# Patient Record
Sex: Male | Born: 1955 | Race: Black or African American | Hispanic: No | Marital: Married | Smoking: Current every day smoker
Health system: Southern US, Community
[De-identification: ages and names within clinical notes are randomized; demographics above are authoritative.]

## PROBLEM LIST (undated history)

## (undated) DIAGNOSIS — M545 Low back pain: Secondary | ICD-10-CM

## (undated) DIAGNOSIS — M4180 Other forms of scoliosis, site unspecified: Secondary | ICD-10-CM

## (undated) DIAGNOSIS — C61 Malignant neoplasm of prostate: Secondary | ICD-10-CM

## (undated) DIAGNOSIS — K611 Rectal abscess: Secondary | ICD-10-CM

## (undated) DIAGNOSIS — N189 Chronic kidney disease, unspecified: Secondary | ICD-10-CM

## (undated) DIAGNOSIS — J309 Allergic rhinitis, unspecified: Secondary | ICD-10-CM

## (undated) DIAGNOSIS — R011 Cardiac murmur, unspecified: Secondary | ICD-10-CM

## (undated) DIAGNOSIS — K922 Gastrointestinal hemorrhage, unspecified: Secondary | ICD-10-CM

## (undated) DIAGNOSIS — K449 Diaphragmatic hernia without obstruction or gangrene: Secondary | ICD-10-CM

## (undated) DIAGNOSIS — K859 Acute pancreatitis without necrosis or infection, unspecified: Secondary | ICD-10-CM

## (undated) DIAGNOSIS — Z862 Personal history of diseases of the blood and blood-forming organs and certain disorders involving the immune mechanism: Secondary | ICD-10-CM

## (undated) DIAGNOSIS — I10 Essential (primary) hypertension: Secondary | ICD-10-CM

## (undated) DIAGNOSIS — F2089 Other schizophrenia: Secondary | ICD-10-CM

## (undated) DIAGNOSIS — M1990 Unspecified osteoarthritis, unspecified site: Secondary | ICD-10-CM

## (undated) DIAGNOSIS — K219 Gastro-esophageal reflux disease without esophagitis: Secondary | ICD-10-CM

## (undated) DIAGNOSIS — E785 Hyperlipidemia, unspecified: Secondary | ICD-10-CM

## (undated) DIAGNOSIS — F329 Major depressive disorder, single episode, unspecified: Secondary | ICD-10-CM

## (undated) DIAGNOSIS — N401 Enlarged prostate with lower urinary tract symptoms: Secondary | ICD-10-CM

## (undated) DIAGNOSIS — E0590 Thyrotoxicosis, unspecified without thyrotoxic crisis or storm: Secondary | ICD-10-CM

## (undated) DIAGNOSIS — J449 Chronic obstructive pulmonary disease, unspecified: Secondary | ICD-10-CM

## (undated) DIAGNOSIS — K648 Other hemorrhoids: Secondary | ICD-10-CM

## (undated) DIAGNOSIS — L723 Sebaceous cyst: Secondary | ICD-10-CM

## (undated) DIAGNOSIS — Z8639 Personal history of other endocrine, nutritional and metabolic disease: Secondary | ICD-10-CM

## (undated) DIAGNOSIS — N529 Male erectile dysfunction, unspecified: Secondary | ICD-10-CM

## (undated) DIAGNOSIS — K760 Fatty (change of) liver, not elsewhere classified: Secondary | ICD-10-CM

## (undated) DIAGNOSIS — K209 Esophagitis, unspecified: Secondary | ICD-10-CM

## (undated) DIAGNOSIS — F17200 Nicotine dependence, unspecified, uncomplicated: Secondary | ICD-10-CM

## (undated) DIAGNOSIS — F411 Generalized anxiety disorder: Secondary | ICD-10-CM

## (undated) HISTORY — DX: Acute pancreatitis without necrosis or infection, unspecified: K85.90

## (undated) HISTORY — DX: Chronic obstructive pulmonary disease, unspecified: J44.9

## (undated) HISTORY — DX: Essential (primary) hypertension: I10

## (undated) HISTORY — DX: Sebaceous cyst: L72.3

## (undated) HISTORY — DX: Male erectile dysfunction, unspecified: N52.9

## (undated) HISTORY — DX: Nicotine dependence, unspecified, uncomplicated: F17.200

## (undated) HISTORY — DX: Hyperlipidemia, unspecified: E78.5

## (undated) HISTORY — DX: Personal history of other endocrine, nutritional and metabolic disease: Z86.39

## (undated) HISTORY — DX: Malignant neoplasm of prostate: C61

## (undated) HISTORY — DX: Other forms of scoliosis, site unspecified: M41.80

## (undated) HISTORY — DX: Allergic rhinitis, unspecified: J30.9

## (undated) HISTORY — PX: GANGLION CYST EXCISION: SHX1691

## (undated) HISTORY — DX: Rectal abscess: K61.1

## (undated) HISTORY — DX: Other schizophrenia: F20.89

## (undated) HISTORY — DX: Thyrotoxicosis, unspecified without thyrotoxic crisis or storm: E05.90

## (undated) HISTORY — DX: Other hemorrhoids: K64.8

## (undated) HISTORY — PX: UPPER GASTROINTESTINAL ENDOSCOPY: SHX188

## (undated) HISTORY — DX: Benign prostatic hyperplasia with lower urinary tract symptoms: N40.1

## (undated) HISTORY — DX: Diaphragmatic hernia without obstruction or gangrene: K44.9

## (undated) HISTORY — DX: Esophagitis, unspecified: K20.9

## (undated) HISTORY — DX: Low back pain: M54.5

## (undated) HISTORY — PX: HERNIA REPAIR: SHX51

## (undated) HISTORY — DX: Gastrointestinal hemorrhage, unspecified: K92.2

## (undated) HISTORY — DX: Gastro-esophageal reflux disease without esophagitis: K21.9

## (undated) HISTORY — DX: Chronic kidney disease, unspecified: N18.9

## (undated) HISTORY — PX: SHOULDER SURGERY: SHX246

## (undated) HISTORY — DX: Generalized anxiety disorder: F41.1

## (undated) HISTORY — DX: Personal history of diseases of the blood and blood-forming organs and certain disorders involving the immune mechanism: Z86.2

## (undated) HISTORY — PX: OTHER SURGICAL HISTORY: SHX169

## (undated) HISTORY — DX: Fatty (change of) liver, not elsewhere classified: K76.0

## (undated) HISTORY — DX: Major depressive disorder, single episode, unspecified: F32.9

---

## 1986-04-19 HISTORY — PX: ABDOMINAL EXPLORATION SURGERY: SHX538

## 1998-11-01 ENCOUNTER — Emergency Department (HOSPITAL_COMMUNITY): Admission: EM | Admit: 1998-11-01 | Discharge: 1998-11-01 | Payer: Self-pay

## 1998-11-03 ENCOUNTER — Encounter: Payer: Self-pay | Admitting: Internal Medicine

## 1998-11-03 ENCOUNTER — Inpatient Hospital Stay (HOSPITAL_COMMUNITY): Admission: AD | Admit: 1998-11-03 | Discharge: 1998-11-07 | Payer: Self-pay | Admitting: Internal Medicine

## 1998-11-06 ENCOUNTER — Encounter: Payer: Self-pay | Admitting: Internal Medicine

## 1998-11-18 ENCOUNTER — Encounter: Payer: Self-pay | Admitting: Internal Medicine

## 1998-11-18 ENCOUNTER — Ambulatory Visit (HOSPITAL_COMMUNITY): Admission: RE | Admit: 1998-11-18 | Discharge: 1998-11-18 | Payer: Self-pay | Admitting: Internal Medicine

## 1998-11-27 ENCOUNTER — Emergency Department (HOSPITAL_COMMUNITY): Admission: EM | Admit: 1998-11-27 | Discharge: 1998-11-27 | Payer: Self-pay | Admitting: Emergency Medicine

## 1998-12-07 ENCOUNTER — Emergency Department (HOSPITAL_COMMUNITY): Admission: EM | Admit: 1998-12-07 | Discharge: 1998-12-07 | Payer: Self-pay | Admitting: Emergency Medicine

## 1998-12-07 ENCOUNTER — Encounter: Payer: Self-pay | Admitting: Emergency Medicine

## 1999-08-20 ENCOUNTER — Emergency Department (HOSPITAL_COMMUNITY): Admission: EM | Admit: 1999-08-20 | Discharge: 1999-08-20 | Payer: Self-pay | Admitting: Emergency Medicine

## 1999-08-20 ENCOUNTER — Encounter: Payer: Self-pay | Admitting: Internal Medicine

## 1999-10-05 ENCOUNTER — Encounter: Payer: Self-pay | Admitting: Gastroenterology

## 1999-10-05 ENCOUNTER — Ambulatory Visit (HOSPITAL_COMMUNITY): Admission: RE | Admit: 1999-10-05 | Discharge: 1999-10-05 | Payer: Self-pay | Admitting: Gastroenterology

## 1999-10-05 ENCOUNTER — Encounter (INDEPENDENT_AMBULATORY_CARE_PROVIDER_SITE_OTHER): Payer: Self-pay | Admitting: *Deleted

## 2000-01-16 ENCOUNTER — Emergency Department (HOSPITAL_COMMUNITY): Admission: EM | Admit: 2000-01-16 | Discharge: 2000-01-16 | Payer: Self-pay | Admitting: Emergency Medicine

## 2000-04-20 ENCOUNTER — Encounter: Payer: Self-pay | Admitting: Orthopedic Surgery

## 2000-04-20 ENCOUNTER — Ambulatory Visit (HOSPITAL_COMMUNITY): Admission: RE | Admit: 2000-04-20 | Discharge: 2000-04-20 | Payer: Self-pay | Admitting: Orthopedic Surgery

## 2000-06-17 ENCOUNTER — Ambulatory Visit (HOSPITAL_COMMUNITY): Admission: RE | Admit: 2000-06-17 | Discharge: 2000-06-17 | Payer: Self-pay | Admitting: Orthopedic Surgery

## 2000-10-26 ENCOUNTER — Emergency Department (HOSPITAL_COMMUNITY): Admission: EM | Admit: 2000-10-26 | Discharge: 2000-10-26 | Payer: Self-pay | Admitting: Emergency Medicine

## 2000-12-22 ENCOUNTER — Encounter: Admission: RE | Admit: 2000-12-22 | Discharge: 2000-12-22 | Payer: Self-pay | Admitting: Orthopedic Surgery

## 2000-12-22 ENCOUNTER — Encounter: Payer: Self-pay | Admitting: Orthopedic Surgery

## 2002-11-01 ENCOUNTER — Emergency Department (HOSPITAL_COMMUNITY): Admission: EM | Admit: 2002-11-01 | Discharge: 2002-11-02 | Payer: Self-pay | Admitting: Emergency Medicine

## 2003-01-31 ENCOUNTER — Encounter: Admission: RE | Admit: 2003-01-31 | Discharge: 2003-01-31 | Payer: Self-pay | Admitting: Internal Medicine

## 2003-01-31 ENCOUNTER — Encounter: Payer: Self-pay | Admitting: Internal Medicine

## 2004-01-09 ENCOUNTER — Emergency Department (HOSPITAL_COMMUNITY): Admission: EM | Admit: 2004-01-09 | Discharge: 2004-01-09 | Payer: Self-pay | Admitting: Emergency Medicine

## 2004-08-06 ENCOUNTER — Emergency Department (HOSPITAL_COMMUNITY): Admission: EM | Admit: 2004-08-06 | Discharge: 2004-08-06 | Payer: Self-pay | Admitting: Emergency Medicine

## 2005-09-11 ENCOUNTER — Emergency Department (HOSPITAL_COMMUNITY): Admission: EM | Admit: 2005-09-11 | Discharge: 2005-09-11 | Payer: Self-pay | Admitting: Emergency Medicine

## 2006-07-19 ENCOUNTER — Ambulatory Visit: Payer: Self-pay | Admitting: Internal Medicine

## 2006-08-08 ENCOUNTER — Ambulatory Visit: Payer: Self-pay | Admitting: Internal Medicine

## 2006-08-08 LAB — CONVERTED CEMR LAB
AST: 15 units/L (ref 0–37)
Albumin: 3.5 g/dL (ref 3.5–5.2)
Albumin: 3.5 g/dL (ref 3.5–5.2)
Alkaline Phosphatase: 68 units/L (ref 39–117)
Alkaline Phosphatase: 68 units/L (ref 39–117)
BUN: 6 mg/dL (ref 6–23)
BUN: 6 mg/dL (ref 6–23)
Basophils Absolute: 0 10*3/uL (ref 0.0–0.1)
Basophils Relative: 0.3 % (ref 0.0–1.0)
Bilirubin, Direct: 0.1 mg/dL (ref 0.0–0.3)
CO2: 33 meq/L — ABNORMAL HIGH (ref 19–32)
CO2: 33 meq/L — ABNORMAL HIGH (ref 19–32)
Calcium: 9.2 mg/dL (ref 8.4–10.5)
Cholesterol: 191 mg/dL (ref 0–200)
Creatinine, Ser: 1.2 mg/dL (ref 0.4–1.5)
Creatinine, Ser: 1.2 mg/dL (ref 0.4–1.5)
Crystals: NEGATIVE
Eosinophils Absolute: 0.1 10*3/uL (ref 0.0–0.6)
Eosinophils Absolute: 0.1 10*3/uL (ref 0.0–0.6)
Eosinophils Relative: 1.7 % (ref 0.0–5.0)
Eosinophils Relative: 1.7 % (ref 0.0–5.0)
GFR calc non Af Amer: 68 mL/min
GFR calc non Af Amer: 68 mL/min
Glucose, Bld: 103 mg/dL — ABNORMAL HIGH (ref 70–99)
Glucose, Bld: 103 mg/dL — ABNORMAL HIGH (ref 70–99)
HCT: 42.8 % (ref 39.0–52.0)
Hemoglobin, Urine: NEGATIVE
Hemoglobin, Urine: NEGATIVE
Hemoglobin: 14.4 g/dL (ref 13.0–17.0)
Ketones, ur: NEGATIVE mg/dL
LDL Cholesterol: 122 mg/dL — ABNORMAL HIGH (ref 0–99)
LDL Cholesterol: 122 mg/dL — ABNORMAL HIGH (ref 0–99)
Leukocytes, UA: NEGATIVE
Lymphocytes Relative: 34.7 % (ref 12.0–46.0)
MCHC: 33.6 g/dL (ref 30.0–36.0)
MCV: 88.2 fL (ref 78.0–100.0)
MCV: 88.2 fL (ref 78.0–100.0)
Monocytes Absolute: 0.6 10*3/uL (ref 0.2–0.7)
Monocytes Relative: 8.1 % (ref 3.0–11.0)
Neutro Abs: 4.5 10*3/uL (ref 1.4–7.7)
Neutro Abs: 4.5 10*3/uL (ref 1.4–7.7)
Neutrophils Relative %: 55.2 % (ref 43.0–77.0)
Neutrophils Relative %: 55.2 % (ref 43.0–77.0)
Nitrite: NEGATIVE
PSA: 2.23 ng/mL (ref 0.10–4.00)
PSA: 2.23 ng/mL (ref 0.10–4.00)
Platelets: 392 10*3/uL (ref 150–400)
Platelets: 392 10*3/uL (ref 150–400)
Potassium: 3.3 meq/L — ABNORMAL LOW (ref 3.5–5.1)
Potassium: 3.3 meq/L — ABNORMAL LOW (ref 3.5–5.1)
RBC / HPF: NONE SEEN
RBC: 4.84 M/uL (ref 4.22–5.81)
RBC: 4.84 M/uL (ref 4.22–5.81)
RDW: 13.3 % (ref 11.5–14.6)
RDW: 13.3 % (ref 11.5–14.6)
Sodium: 140 meq/L (ref 135–145)
Specific Gravity, Urine: 1.025 (ref 1.000–1.03)
Specific Gravity, Urine: 1.025 (ref 1.000–1.03)
TSH: 1.33 microintl units/mL (ref 0.35–5.50)
Total Bilirubin: 0.6 mg/dL (ref 0.3–1.2)
Total Protein: 6.6 g/dL (ref 6.0–8.3)
Triglycerides: 177 mg/dL — ABNORMAL HIGH (ref 0–149)
Urine Glucose: NEGATIVE mg/dL
Urine Glucose: NEGATIVE mg/dL
VLDL: 35 mg/dL (ref 0–40)
WBC: 8 10*3/uL (ref 4.5–10.5)
WBC: 8 10*3/uL (ref 4.5–10.5)

## 2006-08-12 ENCOUNTER — Ambulatory Visit: Payer: Self-pay | Admitting: Internal Medicine

## 2006-11-07 ENCOUNTER — Ambulatory Visit: Payer: Self-pay | Admitting: Internal Medicine

## 2006-11-07 ENCOUNTER — Observation Stay (HOSPITAL_COMMUNITY): Admission: AD | Admit: 2006-11-07 | Discharge: 2006-11-09 | Payer: Self-pay | Admitting: Internal Medicine

## 2006-11-08 ENCOUNTER — Encounter: Payer: Self-pay | Admitting: Gastroenterology

## 2006-11-08 ENCOUNTER — Encounter (INDEPENDENT_AMBULATORY_CARE_PROVIDER_SITE_OTHER): Payer: Self-pay | Admitting: *Deleted

## 2006-11-09 ENCOUNTER — Encounter (INDEPENDENT_AMBULATORY_CARE_PROVIDER_SITE_OTHER): Payer: Self-pay | Admitting: *Deleted

## 2006-11-11 ENCOUNTER — Ambulatory Visit: Payer: Self-pay | Admitting: Gastroenterology

## 2007-01-24 ENCOUNTER — Encounter: Payer: Self-pay | Admitting: Internal Medicine

## 2007-01-24 ENCOUNTER — Ambulatory Visit: Payer: Self-pay | Admitting: Internal Medicine

## 2007-01-25 DIAGNOSIS — F3289 Other specified depressive episodes: Secondary | ICD-10-CM

## 2007-01-25 DIAGNOSIS — F329 Major depressive disorder, single episode, unspecified: Secondary | ICD-10-CM

## 2007-01-25 DIAGNOSIS — F209 Schizophrenia, unspecified: Secondary | ICD-10-CM | POA: Insufficient documentation

## 2007-01-25 DIAGNOSIS — Z8719 Personal history of other diseases of the digestive system: Secondary | ICD-10-CM | POA: Insufficient documentation

## 2007-01-25 DIAGNOSIS — F411 Generalized anxiety disorder: Secondary | ICD-10-CM

## 2007-01-25 DIAGNOSIS — Z862 Personal history of diseases of the blood and blood-forming organs and certain disorders involving the immune mechanism: Secondary | ICD-10-CM

## 2007-01-25 DIAGNOSIS — K922 Gastrointestinal hemorrhage, unspecified: Secondary | ICD-10-CM

## 2007-01-25 DIAGNOSIS — M4180 Other forms of scoliosis, site unspecified: Secondary | ICD-10-CM

## 2007-01-25 DIAGNOSIS — J309 Allergic rhinitis, unspecified: Secondary | ICD-10-CM

## 2007-01-25 DIAGNOSIS — I10 Essential (primary) hypertension: Secondary | ICD-10-CM | POA: Insufficient documentation

## 2007-01-25 DIAGNOSIS — F2089 Other schizophrenia: Secondary | ICD-10-CM

## 2007-01-25 DIAGNOSIS — K219 Gastro-esophageal reflux disease without esophagitis: Secondary | ICD-10-CM

## 2007-01-25 DIAGNOSIS — F419 Anxiety disorder, unspecified: Secondary | ICD-10-CM | POA: Insufficient documentation

## 2007-01-25 DIAGNOSIS — R7302 Impaired glucose tolerance (oral): Secondary | ICD-10-CM | POA: Insufficient documentation

## 2007-01-25 HISTORY — DX: Personal history of diseases of the blood and blood-forming organs and certain disorders involving the immune mechanism: Z86.2

## 2007-01-25 HISTORY — DX: Major depressive disorder, single episode, unspecified: F32.9

## 2007-01-25 HISTORY — DX: Other specified depressive episodes: F32.89

## 2007-01-25 HISTORY — DX: Gastrointestinal hemorrhage, unspecified: K92.2

## 2007-01-25 HISTORY — DX: Generalized anxiety disorder: F41.1

## 2007-01-25 HISTORY — DX: Allergic rhinitis, unspecified: J30.9

## 2007-01-25 HISTORY — DX: Other forms of scoliosis, site unspecified: M41.80

## 2007-01-25 HISTORY — DX: Essential (primary) hypertension: I10

## 2007-01-25 HISTORY — DX: Gastro-esophageal reflux disease without esophagitis: K21.9

## 2007-01-25 HISTORY — DX: Other schizophrenia: F20.89

## 2007-02-17 ENCOUNTER — Telehealth: Payer: Self-pay | Admitting: Internal Medicine

## 2007-07-20 ENCOUNTER — Telehealth: Payer: Self-pay | Admitting: Internal Medicine

## 2007-08-11 ENCOUNTER — Encounter: Payer: Self-pay | Admitting: Internal Medicine

## 2007-11-07 ENCOUNTER — Emergency Department (HOSPITAL_COMMUNITY): Admission: EM | Admit: 2007-11-07 | Discharge: 2007-11-07 | Payer: Self-pay | Admitting: Emergency Medicine

## 2007-11-07 ENCOUNTER — Telehealth: Payer: Self-pay | Admitting: Internal Medicine

## 2007-11-08 ENCOUNTER — Ambulatory Visit: Payer: Self-pay | Admitting: Internal Medicine

## 2007-11-08 DIAGNOSIS — M545 Low back pain, unspecified: Secondary | ICD-10-CM

## 2007-11-08 HISTORY — DX: Low back pain, unspecified: M54.50

## 2007-12-27 ENCOUNTER — Ambulatory Visit: Payer: Self-pay | Admitting: Internal Medicine

## 2007-12-27 DIAGNOSIS — E785 Hyperlipidemia, unspecified: Secondary | ICD-10-CM

## 2007-12-27 HISTORY — DX: Hyperlipidemia, unspecified: E78.5

## 2007-12-27 LAB — CONVERTED CEMR LAB
ALT: 19 units/L (ref 0–53)
AST: 18 units/L (ref 0–37)
Albumin: 4.1 g/dL (ref 3.5–5.2)
Alkaline Phosphatase: 72 units/L (ref 39–117)
Bilirubin, Direct: 0.1 mg/dL (ref 0.0–0.3)
CO2: 30 meq/L (ref 19–32)
Calcium: 9.5 mg/dL (ref 8.4–10.5)
Chloride: 104 meq/L (ref 96–112)
Creatinine, Ser: 1.3 mg/dL (ref 0.4–1.5)
Eosinophils Relative: 1.3 % (ref 0.0–5.0)
GFR calc Af Amer: 75 mL/min
GFR calc non Af Amer: 62 mL/min
Glucose, Bld: 110 mg/dL — ABNORMAL HIGH (ref 70–99)
HCT: 44.8 % (ref 39.0–52.0)
Ketones, ur: NEGATIVE mg/dL
Lymphocytes Relative: 25.7 % (ref 12.0–46.0)
Monocytes Absolute: 0.6 10*3/uL (ref 0.1–1.0)
Monocytes Relative: 5.6 % (ref 3.0–12.0)
Neutro Abs: 7.4 10*3/uL (ref 1.4–7.7)
Neutrophils Relative %: 66.7 % (ref 43.0–77.0)
Nitrite: NEGATIVE
Platelets: 334 10*3/uL (ref 150–400)
Potassium: 4.2 meq/L (ref 3.5–5.1)
Sodium: 141 meq/L (ref 135–145)
Specific Gravity, Urine: <=1.005 (ref 1.000–1.03)
Urobilinogen, UA: 0.2 (ref 0.0–1.0)
WBC: 11.1 10*3/uL — ABNORMAL HIGH (ref 4.5–10.5)
pH: 6 (ref 5.0–8.0)

## 2007-12-28 DIAGNOSIS — K209 Esophagitis, unspecified without bleeding: Secondary | ICD-10-CM

## 2007-12-28 HISTORY — DX: Esophagitis, unspecified without bleeding: K20.90

## 2008-01-01 ENCOUNTER — Ambulatory Visit: Payer: Self-pay | Admitting: Internal Medicine

## 2008-01-02 ENCOUNTER — Ambulatory Visit: Payer: Self-pay | Admitting: Internal Medicine

## 2008-01-04 ENCOUNTER — Ambulatory Visit (HOSPITAL_COMMUNITY): Admission: RE | Admit: 2008-01-04 | Discharge: 2008-01-04 | Payer: Self-pay | Admitting: Internal Medicine

## 2008-01-09 ENCOUNTER — Ambulatory Visit: Payer: Self-pay | Admitting: Internal Medicine

## 2008-01-10 ENCOUNTER — Telehealth: Payer: Self-pay | Admitting: Internal Medicine

## 2008-01-10 ENCOUNTER — Encounter: Payer: Self-pay | Admitting: Internal Medicine

## 2008-01-16 ENCOUNTER — Encounter: Payer: Self-pay | Admitting: Internal Medicine

## 2008-01-18 ENCOUNTER — Encounter (INDEPENDENT_AMBULATORY_CARE_PROVIDER_SITE_OTHER): Payer: Self-pay | Admitting: *Deleted

## 2008-01-25 ENCOUNTER — Ambulatory Visit (HOSPITAL_COMMUNITY): Admission: RE | Admit: 2008-01-25 | Discharge: 2008-01-25 | Payer: Self-pay | Admitting: Internal Medicine

## 2008-01-29 ENCOUNTER — Telehealth: Payer: Self-pay | Admitting: Internal Medicine

## 2008-02-08 ENCOUNTER — Ambulatory Visit: Payer: Self-pay | Admitting: Internal Medicine

## 2008-02-28 ENCOUNTER — Encounter: Payer: Self-pay | Admitting: Internal Medicine

## 2008-04-25 ENCOUNTER — Telehealth: Payer: Self-pay | Admitting: Internal Medicine

## 2008-04-26 ENCOUNTER — Ambulatory Visit: Payer: Self-pay | Admitting: Internal Medicine

## 2008-04-26 DIAGNOSIS — K648 Other hemorrhoids: Secondary | ICD-10-CM

## 2008-04-26 HISTORY — DX: Other hemorrhoids: K64.8

## 2008-05-20 HISTORY — PX: CHOLECYSTECTOMY: SHX55

## 2008-06-10 ENCOUNTER — Telehealth: Payer: Self-pay | Admitting: Internal Medicine

## 2008-06-10 ENCOUNTER — Ambulatory Visit: Payer: Self-pay | Admitting: Internal Medicine

## 2008-06-12 ENCOUNTER — Ambulatory Visit: Payer: Self-pay | Admitting: Internal Medicine

## 2008-06-13 ENCOUNTER — Ambulatory Visit (HOSPITAL_COMMUNITY): Admission: RE | Admit: 2008-06-13 | Discharge: 2008-06-13 | Payer: Self-pay | Admitting: General Surgery

## 2008-06-13 ENCOUNTER — Encounter (INDEPENDENT_AMBULATORY_CARE_PROVIDER_SITE_OTHER): Payer: Self-pay | Admitting: General Surgery

## 2008-06-25 ENCOUNTER — Inpatient Hospital Stay (HOSPITAL_COMMUNITY): Admission: EM | Admit: 2008-06-25 | Discharge: 2008-06-27 | Payer: Self-pay | Admitting: Emergency Medicine

## 2008-06-25 ENCOUNTER — Encounter (INDEPENDENT_AMBULATORY_CARE_PROVIDER_SITE_OTHER): Payer: Self-pay | Admitting: *Deleted

## 2008-06-26 ENCOUNTER — Encounter (INDEPENDENT_AMBULATORY_CARE_PROVIDER_SITE_OTHER): Payer: Self-pay | Admitting: *Deleted

## 2008-06-27 ENCOUNTER — Telehealth (INDEPENDENT_AMBULATORY_CARE_PROVIDER_SITE_OTHER): Payer: Self-pay | Admitting: *Deleted

## 2008-07-01 ENCOUNTER — Ambulatory Visit: Payer: Self-pay | Admitting: Internal Medicine

## 2008-08-19 ENCOUNTER — Telehealth: Payer: Self-pay | Admitting: Internal Medicine

## 2008-10-18 ENCOUNTER — Ambulatory Visit: Payer: Self-pay | Admitting: Internal Medicine

## 2008-10-31 ENCOUNTER — Telehealth (INDEPENDENT_AMBULATORY_CARE_PROVIDER_SITE_OTHER): Payer: Self-pay | Admitting: *Deleted

## 2008-11-26 ENCOUNTER — Telehealth: Payer: Self-pay | Admitting: Internal Medicine

## 2008-12-06 ENCOUNTER — Encounter: Payer: Self-pay | Admitting: Internal Medicine

## 2009-03-04 ENCOUNTER — Telehealth: Payer: Self-pay | Admitting: Internal Medicine

## 2009-03-05 ENCOUNTER — Ambulatory Visit: Payer: Self-pay | Admitting: Internal Medicine

## 2009-03-10 ENCOUNTER — Ambulatory Visit: Payer: Self-pay | Admitting: Internal Medicine

## 2009-03-24 ENCOUNTER — Ambulatory Visit: Payer: Self-pay | Admitting: Internal Medicine

## 2009-03-24 LAB — CONVERTED CEMR LAB
BUN: 8 mg/dL (ref 6–23)
Creatinine, Ser: 1.5 mg/dL (ref 0.4–1.5)
GFR calc non Af Amer: 63.19 mL/min (ref >60)
Glucose, Bld: 118 mg/dL — ABNORMAL HIGH (ref 70–99)
Potassium: 4.3 meq/L (ref 3.5–5.1)

## 2009-03-27 ENCOUNTER — Ambulatory Visit: Payer: Self-pay | Admitting: Internal Medicine

## 2009-05-06 ENCOUNTER — Telehealth: Payer: Self-pay | Admitting: Internal Medicine

## 2009-07-18 HISTORY — PX: OTHER SURGICAL HISTORY: SHX169

## 2009-08-01 ENCOUNTER — Ambulatory Visit (HOSPITAL_BASED_OUTPATIENT_CLINIC_OR_DEPARTMENT_OTHER): Admission: RE | Admit: 2009-08-01 | Discharge: 2009-08-01 | Payer: Self-pay | Admitting: General Surgery

## 2009-08-01 ENCOUNTER — Telehealth: Payer: Self-pay | Admitting: Internal Medicine

## 2009-08-05 ENCOUNTER — Ambulatory Visit: Payer: Self-pay | Admitting: Internal Medicine

## 2009-08-05 DIAGNOSIS — F17200 Nicotine dependence, unspecified, uncomplicated: Secondary | ICD-10-CM

## 2009-08-05 DIAGNOSIS — Z720 Tobacco use: Secondary | ICD-10-CM | POA: Insufficient documentation

## 2009-08-05 DIAGNOSIS — N529 Male erectile dysfunction, unspecified: Secondary | ICD-10-CM

## 2009-08-05 HISTORY — DX: Nicotine dependence, unspecified, uncomplicated: F17.200

## 2009-08-05 HISTORY — DX: Male erectile dysfunction, unspecified: N52.9

## 2009-08-05 LAB — CONVERTED CEMR LAB
BUN: 9 mg/dL (ref 6–23)
GFR calc non Af Amer: 63.1 mL/min (ref >60)
Glucose, Bld: 100 mg/dL — ABNORMAL HIGH (ref 70–99)

## 2009-08-07 ENCOUNTER — Telehealth: Payer: Self-pay | Admitting: Internal Medicine

## 2009-08-18 ENCOUNTER — Telehealth: Payer: Self-pay | Admitting: Internal Medicine

## 2009-08-18 ENCOUNTER — Encounter: Payer: Self-pay | Admitting: Internal Medicine

## 2009-09-01 ENCOUNTER — Telehealth: Payer: Self-pay | Admitting: Internal Medicine

## 2009-09-12 ENCOUNTER — Emergency Department (HOSPITAL_COMMUNITY): Admission: EM | Admit: 2009-09-12 | Discharge: 2009-09-12 | Payer: Self-pay | Admitting: Family Medicine

## 2009-11-14 ENCOUNTER — Telehealth: Payer: Self-pay | Admitting: Internal Medicine

## 2009-11-14 ENCOUNTER — Ambulatory Visit: Payer: Self-pay | Admitting: Internal Medicine

## 2009-11-14 DIAGNOSIS — J020 Streptococcal pharyngitis: Secondary | ICD-10-CM

## 2009-11-14 LAB — CONVERTED CEMR LAB
ALT: 15 units/L (ref 0–53)
AST: 14 units/L (ref 0–37)
Amylase: 76 units/L (ref 27–131)
Basophils Absolute: 0 10*3/uL (ref 0.0–0.1)
Basophils Relative: 0.4 % (ref 0.0–3.0)
Bilirubin Urine: NEGATIVE
Bilirubin, Direct: 0.1 mg/dL (ref 0.0–0.3)
CO2: 29 meq/L (ref 19–32)
Calcium: 9.1 mg/dL (ref 8.4–10.5)
Creatinine, Ser: 1.2 mg/dL (ref 0.4–1.5)
Eosinophils Absolute: 0.2 10*3/uL (ref 0.0–0.7)
Glucose, Bld: 102 mg/dL — ABNORMAL HIGH (ref 70–99)
HCT: 43.6 % (ref 39.0–52.0)
Ketones, ur: NEGATIVE mg/dL
Lipase: 11 units/L (ref 11.0–59.0)
MCHC: 33.8 g/dL (ref 30.0–36.0)
Monocytes Absolute: 0.7 10*3/uL (ref 0.1–1.0)
Monocytes Relative: 8.4 % (ref 3.0–12.0)
Neutro Abs: 5.2 10*3/uL (ref 1.4–7.7)
Neutrophils Relative %: 58.8 % (ref 43.0–77.0)
Nitrite: NEGATIVE
Platelets: 275 10*3/uL (ref 150.0–400.0)
Potassium: 3.3 meq/L — ABNORMAL LOW (ref 3.5–5.1)
RDW: 15.2 % — ABNORMAL HIGH (ref 11.5–14.6)
Sodium: 138 meq/L (ref 135–145)
Specific Gravity, Urine: >=1.03 (ref 1.000–1.030)
TSH: 0.74 microintl units/mL (ref 0.35–5.50)
Total Bilirubin: 0.6 mg/dL (ref 0.3–1.2)
WBC: 8.8 10*3/uL (ref 4.5–10.5)

## 2010-01-29 ENCOUNTER — Emergency Department (HOSPITAL_COMMUNITY): Admission: EM | Admit: 2010-01-29 | Discharge: 2010-01-29 | Payer: Self-pay | Admitting: Emergency Medicine

## 2010-01-29 ENCOUNTER — Encounter (INDEPENDENT_AMBULATORY_CARE_PROVIDER_SITE_OTHER): Payer: Self-pay | Admitting: *Deleted

## 2010-02-04 ENCOUNTER — Emergency Department (HOSPITAL_COMMUNITY): Admission: EM | Admit: 2010-02-04 | Discharge: 2010-02-04 | Payer: Self-pay | Admitting: Emergency Medicine

## 2010-02-04 ENCOUNTER — Encounter (INDEPENDENT_AMBULATORY_CARE_PROVIDER_SITE_OTHER): Payer: Self-pay | Admitting: *Deleted

## 2010-02-05 ENCOUNTER — Ambulatory Visit: Payer: Self-pay | Admitting: Internal Medicine

## 2010-02-27 ENCOUNTER — Ambulatory Visit: Payer: Self-pay | Admitting: Internal Medicine

## 2010-02-27 ENCOUNTER — Telehealth: Payer: Self-pay | Admitting: Internal Medicine

## 2010-02-27 DIAGNOSIS — K921 Melena: Secondary | ICD-10-CM

## 2010-02-27 DIAGNOSIS — N138 Other obstructive and reflux uropathy: Secondary | ICD-10-CM

## 2010-02-27 DIAGNOSIS — N401 Enlarged prostate with lower urinary tract symptoms: Secondary | ICD-10-CM

## 2010-02-27 DIAGNOSIS — R1013 Epigastric pain: Secondary | ICD-10-CM

## 2010-02-27 HISTORY — DX: Other obstructive and reflux uropathy: N13.8

## 2010-02-27 LAB — CONVERTED CEMR LAB
ALT: 26 units/L (ref 0–53)
AST: 21 units/L (ref 0–37)
Albumin: 4.1 g/dL (ref 3.5–5.2)
BUN: 7 mg/dL (ref 6–23)
Basophils Absolute: 0.2 10*3/uL — ABNORMAL HIGH (ref 0.0–0.1)
Basophils Relative: 1.7 % (ref 0.0–3.0)
Benzodiazepines.: NEGATIVE
Bilirubin, Direct: 0.1 mg/dL (ref 0.0–0.3)
CO2: 31 meq/L (ref 19–32)
Calcium: 9.9 mg/dL (ref 8.4–10.5)
Chloride: 99 meq/L (ref 96–112)
Creatinine,U: 109.2 mg/dL
Eosinophils Absolute: 0.2 10*3/uL (ref 0.0–0.7)
GFR calc non Af Amer: 76.3 mL/min (ref >60)
Ketones, ur: NEGATIVE mg/dL
Leukocytes, UA: NEGATIVE
Lipase: 56 units/L (ref 11.0–59.0)
MCHC: 34.5 g/dL (ref 30.0–36.0)
MCV: 88.3 fL (ref 78.0–100.0)
Marijuana Metabolite: POSITIVE — AB
Monocytes Absolute: 0.7 10*3/uL (ref 0.1–1.0)
Neutrophils Relative %: 55.7 % (ref 43.0–77.0)
Phencyclidine (PCP): NEGATIVE
Platelets: 344 10*3/uL (ref 150.0–400.0)
Potassium: 4.1 meq/L (ref 3.5–5.1)
RBC: 5.15 M/uL (ref 4.22–5.81)
RDW: 14.6 % (ref 11.5–14.6)
Sodium: 137 meq/L (ref 135–145)
Specific Gravity, Urine: <=1.005 (ref 1.000–1.030)
Total Protein, Urine: NEGATIVE mg/dL
Urobilinogen, UA: 0.2 (ref 0.0–1.0)
WBC: 9.7 10*3/uL (ref 4.5–10.5)
pH: 7 (ref 5.0–8.0)

## 2010-03-26 ENCOUNTER — Ambulatory Visit: Payer: Self-pay | Admitting: Internal Medicine

## 2010-03-30 ENCOUNTER — Ambulatory Visit: Payer: Self-pay | Admitting: Internal Medicine

## 2010-03-30 ENCOUNTER — Telehealth: Payer: Self-pay | Admitting: Internal Medicine

## 2010-03-30 DIAGNOSIS — L02838 Carbuncle of other sites: Secondary | ICD-10-CM

## 2010-03-30 DIAGNOSIS — L02828 Furuncle of other sites: Secondary | ICD-10-CM

## 2010-04-23 ENCOUNTER — Encounter: Payer: Self-pay | Admitting: Internal Medicine

## 2010-04-23 ENCOUNTER — Ambulatory Visit
Admission: RE | Admit: 2010-04-23 | Discharge: 2010-04-23 | Payer: Self-pay | Source: Home / Self Care | Attending: Internal Medicine | Admitting: Internal Medicine

## 2010-04-23 HISTORY — PX: COLONOSCOPY: SHX174

## 2010-05-19 NOTE — Assessment & Plan Note (Signed)
Summary: DR MEN PT/NO SLOT-ABD PAIN--[facility]  [facility]   Vital Signs:  Patient profile:   55 year old male Height:      71 inches Weight:      259 pounds BMI:     36.25 O2 Sat:      98 % on Room air Temp:     98.8 degrees F oral Pulse rate:   81 / minute Pulse rhythm:   regular Resp:     16 per minute BP sitting:   142 / 98  (left arm) Cuff size:   large  Vitals Entered By: Rock Nephew CMA (February 27, 2010 2:02 PM)  Nutrition Counseling: Patient's BMI is greater than 25 and therefore counseled on weight management options.  O2 Flow:  Room air CC: Patient c/o of nausea diarrhea and vomiting, Abdominal Pain   Primary Care Provider:  Jacques Navy MD  CC:  Patient c/o of nausea diarrhea and vomiting and Abdominal Pain.  History of Present Illness: " I have been having nausea,vomiting, abdominal pain and diarrhea for 20 years ". He has been taking Mylanta for GERD.  Dyspepsia History:      The patient has positive alarm features of dyspepsia which include persistent vomiting.  There is a prior history of GERD.  The patient does not have a prior history of documented ulcer disease.  The dominant symptom is heartburn or acid reflux.  An H-2 blocker medication is currently being taken.  A prior EGD has been done.     Preventive Screening-Counseling & Management  Alcohol-Tobacco     Alcohol drinks/day: <1     Alcohol type: beer     >5/day in last 3 mos: no     Alcohol Counseling: not indicated; use of alcohol is not excessive or problematic     Feels need to cut down: no     Feels annoyed by complaints: no     Feels guilty re: drinking: no     Needs 'eye opener' in am: no     Smoking Status: current     Smoking Cessation Counseling: yes     Packs/Day: 1.5     Year Started: 1980     Pack years: 28     Tobacco Counseling: to quit use of tobacco products  Hep-HIV-STD-Contraception     Hepatitis Risk: no risk noted     HIV Risk: no risk noted     STD Risk: no risk  noted      Sexual History:  currently monogamous.        Drug Use:  marijuana.        Blood Transfusions:  no.    Clinical Review Panels:  Prevention   Last Colonoscopy:  Dr. Brodie:Normal colonoscopy. Hemorrhoids present. (11/18/1998)   Last PSA:  2.23 (08/08/2006)  Immunizations   Last Tetanus Booster:  given (04/19/1992)  Lipid Management   Cholesterol:  191 (08/08/2006)   LDL (bad choesterol):  122 (08/08/2006)   HDL (good cholesterol):  33.8 (08/08/2006)  Diabetes Management   HgBA1C:  5.7 (11/14/2009)   Creatinine:  1.2 (11/14/2009)  CBC   WBC:  8.8 (11/14/2009)   RBC:  4.93 (11/14/2009)   Hgb:  14.8 (11/14/2009)   Hct:  43.6 (11/14/2009)   Platelets:  275.0 (11/14/2009)   MCV  88.6 (11/14/2009)   MCHC  33.8 (11/14/2009)   RDW  15.2 (11/14/2009)   PMN:  58.8 (11/14/2009)   Lymphs:  30.3 (11/14/2009)   Monos:  8.4 (11/14/2009)  Eosinophils:  2.1 (11/14/2009)   Basophil:  0.4 (11/14/2009)  Complete Metabolic Panel   Glucose:  102 (11/14/2009)   Sodium:  138 (11/14/2009)   Potassium:  3.3 (11/14/2009)   Chloride:  104 (11/14/2009)   CO2:  29 (11/14/2009)   BUN:  7 (11/14/2009)   Creatinine:  1.2 (11/14/2009)   Albumin:  3.8 (11/14/2009)   Total Protein:  7.0 (11/14/2009)   Calcium:  9.1 (11/14/2009)   Total Bili:  0.6 (11/14/2009)   Alk Phos:  72 (11/14/2009)   SGPT (ALT):  15 (11/14/2009)   SGOT (AST):  14 (11/14/2009)   Medications Prior to Update: 1)  Thiothixene 10 Mg  Caps (Thiothixene) .... 2 At Bedtime 2)  Carvedilol 12.5 Mg Tabs (Carvedilol) .[name].. 1 Two Times A Day 3)  Micro-K 10 Meq Cr-Caps (Potassium Chloride) .... One By Mouth Two Times A Day  Current Medications (verified): 1)  Thiothixene 10 Mg  Caps (Thiothixene) .... 2 At Bedtime 2)  Carvedilol 12.5 Mg Tabs (Carvedilol) .[name].. 1 Two Times A Day 3)  Micro-K 10 Meq Cr-Caps (Potassium Chloride) .... One By Mouth Two Times A Day 4)  Ranitidine Hcl 300 Mg Caps (Ranitidine Hcl) .... One By  Mouth Once Daily For Heartburn  Allergies (verified): 1)  ! Reglan 2)  ! Celebrex 3)  ! * Ivp Dye  Past History:  Past Medical History: Last updated: 12/27/2007 LOW BACK PAIN, CHRONIC (ICD-724.2) Hx of HEMORRHOIDS, EXTERNAL W/O COMPLICATION (ICD-455.3) SCOLIOSIS NEC (ICD-737.39) Hx of UPPER GASTROINTESTINAL HEMORRHAGE (ICD-578.9) ALLERGIC RHINITIS (ICD-477.9) GLUCOSE INTOLERANCE, HX OF (ICD-V12.2) ANXIETY DISORDER, GENERALIZED (ICD-300.02) DEPRESSION (ICD-311) PANCREATITIS, ACUTE, HX OF (ICD-V12.70) - non alcohol per pt SCHIZOPHRENIA NEC, CHRONIC (ICD-295.82) ABSCESS, ANAL/RECTAL REGIONS (ICD-566) GERD (ICD-530.81) HYPERTENSION, ESSENTIAL NOS (ICD-401.9) Hyperlipidemia Allergic rhinitis  Past Surgical History: Last updated: 08/05/2009 excision of thyroid mass ex. lap '88-no diagnosis ventral hernia repair Laprascopic cholecystectomy Feb '10 ([facility]) excison of scalp lesion - April '11 ([facility])  Family History: Last updated: 02/08/2008 prostate cancer HTN mother and father No FH of Colon Cancer:  Social History: Last updated: 11/14/2009 disabled due to schizophrenia - 1983 stable long-term marriage cared for his father-in-law- passed away in 09/18/22 Current Smoker Alcohol use-no Drug use-no Regular exercise-no  Risk Factors: Alcohol Use: <1 (02/27/2010) >5 drinks/d w/in last 3 months: no (02/27/2010) Exercise: no (11/14/2009)  Risk Factors: Smoking Status: current (02/27/2010) Packs/Day: 1.5 (02/27/2010)  Family History: Reviewed history from 02/08/2008 and no changes required. prostate cancer HTN mother and father No FH of Colon Cancer:  Social History: Reviewed history from 11/14/2009 and no changes required. disabled due to schizophrenia - 1983 stable long-term marriage cared for his father-in-law- passed away in 2022-09-18 Current Smoker Alcohol use-no Drug use-no Regular exercise-no Drug Use:  marijuana  Review of Systems       The  patient complains of weight loss, abdominal pain, and severe indigestion/heartburn.  The patient denies anorexia, fever, weight gain, chest pain, syncope, dyspnea on exertion, peripheral edema, prolonged cough, headaches, hemoptysis, melena, hematochezia, hematuria, suspicious skin lesions, transient blindness, difficulty walking, unusual weight change, abnormal bleeding, enlarged lymph nodes, angioedema, and testicular masses.   General:  Complains of weight loss; denies chills, fatigue, fever, loss of appetite, malaise, sleep disorder, sweats, and weakness. GI:  Complains of abdominal pain, constipation, and diarrhea; denies bloody stools, change in bowel habits, dark tarry stools, gas, hemorrhoids, indigestion, loss of appetite, nausea, vomiting, vomiting blood, and yellowish skin color. GU:  Denies discharge, dysuria, hematuria, nocturia, urinary frequency, and urinary  hesitancy. Psych:  Complains of anxiety and depression; denies alternate hallucination ( auditory/visual), easily angered, easily tearful, irritability, mental problems, panic attacks, sense of great danger, suicidal thoughts/plans, thoughts of violence, unusual visions or sounds, and thoughts /plans of harming others.  Physical Exam  General:  alert, well-developed, well-nourished, well-hydrated, healthy-appearing, cooperative to examination, overweight-appearing, poor hygiene, and unkempt.   Head:  normocephalic, atraumatic, no abnormalities observed, and no abnormalities palpated.   Eyes:  vision grossly intact, pupils equal, pupils round, pupils reactive to light, and no injection or icterus. Ears:  R ear normal and L ear normal.   Mouth:  Oral mucosa and oropharynx without lesions or exudates.  Teeth in good repair. Neck:  supple, full ROM, no masses, no thyromegaly, no JVD, normal carotid upstroke, no carotid bruits, no cervical lymphadenopathy, and no neck tenderness.   Lungs:  normal respiratory effort, no intercostal  retractions, no accessory muscle use, normal breath sounds, no dullness, no fremitus, no crackles, and no wheezes.   Heart:  normal rate, regular rhythm, no murmur, no gallop, no rub, and no JVD.   Abdomen:  soft, non-tender, no distention, no masses, no guarding, no rigidity, no rebound tenderness, no inguinal hernia, no hepatomegaly, no splenomegaly, abdominal scar(s), bowel sounds hypoactive, and incisional hernia.   Rectal:  no external abnormalities, normal sphincter tone, no masses, no tenderness, no fissures, no fistulae, no perianal rash, stool positive for occult blood, and external hemorrhoid(s).   Genitalia:  circumcised, no hydrocele, no varicocele, no scrotal masses, no testicular masses or atrophy, no cutaneous lesions, and no urethral discharge.   Prostate:  no nodules, no asymmetry, no induration, and 1+ enlarged.   Msk:  normal ROM, no joint tenderness, no joint swelling, no joint warmth, no redness over joints, no joint deformities, no joint instability, and no crepitation.   Pulses:  R and L carotid,radial,femoral,dorsalis pedis and posterior tibial pulses are full and equal bilaterally Extremities:  No clubbing, cyanosis, edema, or deformity noted with normal full range of motion of all joints.   Neurologic:  alert & oriented X3 and gait normal.   Skin:  turgor normal, color normal, no rashes, no suspicious lesions, no ecchymoses, no petechiae, no purpura, no ulcerations, and no edema.   Cervical Nodes:  no anterior cervical adenopathy and no posterior cervical adenopathy.   Axillary Nodes:  no R axillary adenopathy and no L axillary adenopathy.   Inguinal Nodes:  no R inguinal adenopathy and no L inguinal adenopathy.   Psych:  Oriented X3, memory intact for recent and remote, normally interactive, good eye contact, not anxious appearing, not depressed appearing, not agitated, not suicidal, and not homicidal.     Impression & Recommendations:  Problem # 1:  BLOOD IN STOOL  (ICD-578.1) Assessment New he may need an endoscopy Orders: Gastroenterology Referral (GI) Venipuncture (16109) TLB-BMP (Basic Metabolic Panel-BMET) (80048-METABOL) TLB-CBC Platelet - w/Differential (85025-CBCD) TLB-Hepatic/Liver Function Pnl (80076-HEPATIC) TLB-TSH (Thyroid Stimulating Hormone) (84443-TSH) TLB-Amylase (82150-AMYL) TLB-Lipase (83690-LIPASE) TLB-Udip w/ Micro (81001-URINE) TLB-PSA (Prostate Specific Antigen) (84153-PSA) Hemoccult Guaiac-1 spec.(in office) (82270)  Problem # 2:  HYPERTROPHY PROSTATE W/UR OBST & OTH LUTS (ICD-600.01) Assessment: New  Orders: Venipuncture (60454) TLB-BMP (Basic Metabolic Panel-BMET) (80048-METABOL) TLB-CBC Platelet - w/Differential (85025-CBCD) TLB-Hepatic/Liver Function Pnl (80076-HEPATIC) TLB-TSH (Thyroid Stimulating Hormone) (84443-TSH) TLB-Amylase (82150-AMYL) TLB-Lipase (83690-LIPASE) TLB-Udip w/ Micro (81001-URINE) TLB-PSA (Prostate Specific Antigen) (84153-PSA)  PSA: 2.23 (08/08/2006)     Problem # 3:  ABDOMINAL PAIN, EPIGASTRIC (ICD-789.06) Assessment: Unchanged I will check his labs to look for  causes and look at plain films for SBO Orders: Venipuncture (95621) TLB-BMP (Basic Metabolic Panel-BMET) (80048-METABOL) TLB-CBC Platelet - w/Differential (85025-CBCD) TLB-Hepatic/Liver Function Pnl (80076-HEPATIC) TLB-TSH (Thyroid Stimulating Hormone) (84443-TSH) TLB-Amylase (82150-AMYL) TLB-Lipase (83690-LIPASE) TLB-Udip w/ Micro (81001-URINE) TLB-PSA (Prostate Specific Antigen) (84153-PSA) T-Drug Screen-Urine, (single) (30865-78469) T-Acute Abdomen (2 view w/ PA & Chest (62952WU)  Discussed symptom control with the patient.   Problem # 4:  GERD (ICD-530.81) Assessment: Deteriorated stop mylanta as it may be causing diarrhea His updated medication list for this problem includes:    Ranitidine Hcl 300 Mg Caps (Ranitidine hcl) ..... One by mouth once daily for heartburn  Orders: Venipuncture (13244) TLB-BMP (Basic  Metabolic Panel-BMET) (80048-METABOL) TLB-CBC Platelet - w/Differential (85025-CBCD) TLB-Hepatic/Liver Function Pnl (80076-HEPATIC) TLB-TSH (Thyroid Stimulating Hormone) (84443-TSH) TLB-Amylase (82150-AMYL) TLB-Lipase (83690-LIPASE) TLB-Udip w/ Micro (81001-URINE) TLB-PSA (Prostate Specific Antigen) (84153-PSA) Hemoccult Guaiac-1 spec.(in office) (01027)  EGD: Location: [facility]   (01/02/2008)  Labs Reviewed: Hgb: 14.8 (11/14/2009)   Hct: 43.6 (11/14/2009)  Problem # 5:  HYPERTENSION, ESSENTIAL NOS (ICD-401.9) Assessment: Deteriorated  His updated medication list for this problem includes:    Carvedilol 12.5 Mg Tabs (Carvedilol) .[name]... 1 two times a day  Orders: Venipuncture (25366) TLB-BMP (Basic Metabolic Panel-BMET) (80048-METABOL) TLB-CBC Platelet - w/Differential (85025-CBCD) TLB-Hepatic/Liver Function Pnl (80076-HEPATIC) TLB-TSH (Thyroid Stimulating Hormone) (84443-TSH) TLB-Amylase (82150-AMYL) TLB-Lipase (83690-LIPASE) TLB-Udip w/ Micro (81001-URINE) TLB-PSA (Prostate Specific Antigen) (84153-PSA) T-Drug Screen-Urine, (single) (44034-74259)  BP today: 142/98 Prior BP: 114/80 (02/05/2010)  Labs Reviewed: K+: 3.3 (11/14/2009) Creat: : 1.2 (11/14/2009)   Chol: 191 (08/08/2006)   HDL: 33.8 (08/08/2006)   LDL: 122 (08/08/2006)   TG: 177 (08/08/2006)  Complete Medication List: 1)  Thiothixene 10 Mg Caps (Thiothixene) .... 2 at bedtime 2)  Carvedilol 12.5 Mg Tabs (Carvedilol) .[name].. 1 two times a day 3)  Micro-k 10 Meq Cr-caps (Potassium chloride) .... One by mouth two times a day 4)  Ranitidine Hcl 300 Mg Caps (Ranitidine hcl) .... One by mouth once daily for heartburn  Patient Instructions: 1)  Please schedule a follow-up appointment in 2 weeks. 2)  Avoid foods high in acid (tomatoes, citrus juices, spicy foods). Avoid eating within two hours of lying down or before exercising. Do not over eat; try smaller more frequent meals. Elevate head of bed  twelve inches when sleeping. 3)  Tobacco is very bad for your health and your loved ones! You Should stop smoking!. 4)  Stop Smoking Tips: Choose a Quit date. Cut down before the Quit date. decide what you will do as a substitute when you feel the urge to smoke(gum,toothpick,exercise). 5)  It is important that you exercise regularly at least 20 minutes 5 times a week. If you develop chest pain, have severe difficulty breathing, or feel very tired , stop exercising immediately and seek medical attention. 6)  You need to lose weight. Consider a lower calorie diet and regular exercise.  Prescriptions: RANITIDINE HCL 300 MG CAPS (RANITIDINE HCL) one by mouth once daily for heartburn  #30 x 11   Entered and Authorized by:   Etta Grandchild MD   Signed by:   Etta Grandchild MD on 02/27/2010   Method used:   Electronically to        [facility] [phone]* (retail)       [address]       [place], [place]  [address]       Ph: [phone] or [phone]  Fax: [phone]   RxID:   [id number]    Orders Added: 1)  Gastroenterology Referral [GI] 2)  Venipuncture [96295] 3)  TLB-BMP (Basic Metabolic Panel-BMET) [80048-METABOL] 4)  TLB-CBC Platelet - w/Differential [85025-CBCD] 5)  TLB-Hepatic/Liver Function Pnl [80076-HEPATIC] 6)  TLB-TSH (Thyroid Stimulating Hormone) [84443-TSH] 7)  TLB-Amylase [82150-AMYL] 8)  TLB-Lipase [83690-LIPASE] 9)  TLB-Udip w/ Micro [81001-URINE] 10)  TLB-PSA (Prostate Specific Antigen) [28413-KGM] 11)  T-Drug Screen-Urine, (single) [80101-82900] 12)  T-Acute Abdomen (2 view w/ PA & Chest [74022TC] 13)  Est. Patient Level V [01027] 14)  Hemoccult Guaiac-1 spec.(in office) [82270]

## 2010-05-19 NOTE — Progress Notes (Signed)
Summary: klor con  Phone Note Outgoing Call   Reason for Call: Discuss lab or test results Summary of Call: please call Ketan - Potassium is still a little low at 3.4. He needs to start taking a potassium supplement. Rx sent to his pharmacy.  Thank you Initial call taken by: Jacques Navy MD,  August 07, 2009 6:28 AM  Follow-up for Phone Call        Notified pt with results & md recommendations Follow-up by: Orlan Leavens,  August 07, 2009 3:18 PM    New/Updated Medications: ED K+10 10 MEQ CR-TABS (POTASSIUM CHLORIDE) 1 by mouth once daily Prescriptions: ED K+10 10 MEQ CR-TABS (POTASSIUM CHLORIDE) 1 by mouth once daily  #30 x 12   Entered and Authorized by:   Jacques Navy MD   Signed by:   Jacques Navy MD on 08/07/2009   Method used:   Electronically to        [facility] [phone]* (retail)       [address]       [place], [place]  [address]       Ph: [phone] or [phone]       Fax: [phone]   RxID:   [id number]

## 2010-05-19 NOTE — Letter (Signed)
Summary: [facility]   Imported By: Lester Buffalo 09/18/2009 11:07:15  _____________________________________________________________________  External Attachment:    Type:   Image     Comment:   External Document

## 2010-05-19 NOTE — Letter (Signed)
Summary: [facility] Instructions  [facility]  [address] [place], [place] [address]   Phone: [phone]  Fax: [phone]       RAYLON LAMSON    1955/06/24    MRN: [id number]       Procedure Day /Date: Tuesday 04/23/10     Arrival Time: 2:30 pm     Procedure Time: 3:30 pm     Location of Procedure:                    _x _  [facility] (4th Floor)  PREPARATION FOR ENDOSCOPY/COLONOSCOPY WITH MIRALAX  Starting 5 days prior to your procedure 04/18/10 do not eat nuts, seeds, popcorn, corn, beans, peas,  salads, or any raw vegetables.  Do not take any fiber supplements (e.g. Metamucil, Citrucel, and Benefiber). ____________________________________________________________________________________________________   THE DAY BEFORE YOUR PROCEDURE         DATE: 04/22/10 DAY: Monday  1   Drink clear liquids the entire day-NO SOLID FOOD  2   Do not drink anything colored red or purple.  Avoid juices with pulp.  No orange juice.  3   Drink at least 64 oz. (8 glasses) of fluid/clear liquids during the day to prevent dehydration and help the prep work efficiently.  CLEAR LIQUIDS INCLUDE: Water Jello Ice Popsicles Tea (sugar ok, no milk/cream) Powdered fruit flavored drinks Coffee (sugar ok, no milk/cream) Gatorade Juice: apple, white grape, white cranberry  Lemonade Clear bullion, consomm, broth Carbonated beverages (any kind) Strained chicken noodle soup Hard Candy  4   Mix the entire bottle of Miralax with 64 oz. of Gatorade/Powerade in the morning and put in the refrigerator to chill.  5   At 3:00 pm take 2 Dulcolax/Bisacodyl tablets.  6   At 4:30 pm take one Reglan/Metoclopramide tablet.  7  Starting at 5:00 pm drink one 8 oz glass of the Miralax mixture every 15-20 minutes until you have finished drinking the entire 64 oz.  You should finish drinking prep around 7:30 or 8:00 pm.  8   If you are nauseated, you may take the 2nd Reglan/Metoclopramide  tablet at 6:30 pm.        9    At 8:00 pm take 2 more DULCOLAX/Bisacodyl tablets.        THE DAY OF YOUR PROCEDURE      DATE:  04/23/10 DAY: Tuesday  You may drink clear liquids until 1:30 pm  (2 HOURS BEFORE PROCEDURE).   MEDICATION INSTRUCTIONS  Unless otherwise instructed, you should take regular prescription medications with a small sip of water as early as possible the morning of your procedure.          OTHER INSTRUCTIONS  You will need a responsible adult at least 54 years of age to accompany you and drive you home.   This person must remain in the waiting room during your procedure.  Wear loose fitting clothing that is easily removed.  Leave jewelry and other valuables at home.  However, you may wish to bring a book to read or an iPod/MP3 player to listen to music as you wait for your procedure to start.  Remove all body piercing jewelry and leave at home.  Total time from sign-in until discharge is approximately 2-3 hours.  You should go home directly after your procedure and rest.  You can resume normal activities the day after your procedure.  The day of your procedure you should not:   Drive  Make legal decisions   Operate machinery   Drink alcohol   Return to work  You will receive specific instructions about eating, activities and medications before you leave.   The above instructions have been reviewed and explained to me by   _______________________    I fully understand and can verbalize these instructions _____________________________ Date _______

## 2010-05-19 NOTE — Assessment & Plan Note (Signed)
Summary: SORE THROAT/NWS   Vital Signs:  Patient profile:   55 year old male Height:      71 inches Weight:      258.50 pounds BMI:     36.18 O2 Sat:      96 % on Room air Temp:     98.3 degrees F oral Pulse rate:   92 / minute Pulse rhythm:   regular Resp:     16 per minute BP sitting:   140 / 88  (left arm) Cuff size:   large  Vitals Entered By: Rock Nephew CMA (November 14, 2009 11:19 AM)  Nutrition Counseling: Patient's BMI is greater than 25 and therefore counseled on weight management options.  O2 Flow:  Room air CC: sore thraot x 2-3 days, URI symptoms Is Patient Diabetic? No Pain Assessment Patient in pain? no        Primary Care Provider:  Jacques Navy MD  CC:  sore thraot x 2-3 days and URI symptoms.  History of Present Illness:  URI Symptoms      This is a 55 year old man who presents with URI symptoms.  The symptoms began 5 days ago.  The severity is described as mild.  The patient reports sore throat, but denies nasal congestion, clear nasal discharge, purulent nasal discharge, dry cough, productive cough, earache, and sick contacts.  The patient denies fever, stiff neck, dyspnea, wheezing, rash, vomiting, diarrhea, use of an antipyretic, and response to antipyretic.  The patient denies headache, muscle aches, and severe fatigue.  The patient denies the following risk factors for Strep sinusitis: unilateral facial pain, unilateral nasal discharge, poor response to decongestant, double sickening, tooth pain, Strep exposure, tender adenopathy, and absence of cough.    He has chronic nausea and vomiting and he says that this has not been any worse lately. He has an abd. wall hernia that is not painfil to him.  Preventive Screening-Counseling & Management  Alcohol-Tobacco     Alcohol drinks/day: 0     Smoking Status: current     Smoking Cessation Counseling: yes     Packs/Day: 1.5     Year Started: 1980     Pack years: 38     Tobacco Counseling: to quit  use of tobacco products  Caffeine-Diet-Exercise     Does Patient Exercise: no     PHQ-9 Score: 1-4 minimal depression  Hep-HIV-STD-Contraception     Hepatitis Risk: no risk noted     HIV Risk: no risk noted     STD Risk: no risk noted      Sexual History:  currently monogamous.        Drug Use:  no.        Blood Transfusions:  no.    Current Medications (verified): 1)  Thiothixene 10 Mg  Caps (Thiothixene) .... 2 At Bedtime 2)  Omeprazole 40 Mg Cpdr (Omeprazole) .[name].. 1 By Mouth Q Am 3)  Ibuprofen 200 Mg Tabs (Ibuprofen) 4)  Tylenol Extra Strength 500 Mg  Tabs (Acetaminophen) .... As Directed As Needed 5)  Vitamin D3 1000 Unit  Tabs (Cholecalciferol) .[name].. 1 By Mouth Daily 6)  Chantix Starting Month Pak 0.5 Mg X 11 & 1 Mg X 42 Tabs (Varenicline Tartrate) .... Take As Directed. 7)  Dicyclomine Hcl 10 Mg Caps (Dicyclomine Hcl) .[name].. 1 Q 6 As Needed 8)  Cialis 20 Mg Tabs (Tadalafil) .[name].. 1 By Mouth As Needed. 9)  Ed K+10 10 Meq Cr-Tabs (Potassium Chloride) .Marland KitchenMarland KitchenMarland Kitchen 1  By Mouth Once Daily 10)  Carvedilol 12.5 Mg Tabs (Carvedilol) .[name].. 1 Two Times A Day  Allergies (verified): 1)  ! Reglan 2)  ! Celebrex 3)  ! * Ivp Dye  Past History:  Past Medical History: Last updated: 12/27/2007 LOW BACK PAIN, CHRONIC (ICD-724.2) Hx of HEMORRHOIDS, EXTERNAL W/O COMPLICATION (ICD-455.3) SCOLIOSIS NEC (ICD-737.39) Hx of UPPER GASTROINTESTINAL HEMORRHAGE (ICD-578.9) ALLERGIC RHINITIS (ICD-477.9) GLUCOSE INTOLERANCE, HX OF (ICD-V12.2) ANXIETY DISORDER, GENERALIZED (ICD-300.02) DEPRESSION (ICD-311) PANCREATITIS, ACUTE, HX OF (ICD-V12.70) - non alcohol per pt SCHIZOPHRENIA NEC, CHRONIC (ICD-295.82) ABSCESS, ANAL/RECTAL REGIONS (ICD-566) GERD (ICD-530.81) HYPERTENSION, ESSENTIAL NOS (ICD-401.9) Hyperlipidemia Allergic rhinitis  Past Surgical History: Last updated: 08/05/2009 excision of thyroid mass ex. lap '88-no diagnosis ventral hernia repair Laprascopic cholecystectomy Feb '10  ([facility]) excison of scalp lesion - April '11 ([facility])  Family History: Last updated: 02/08/2008 prostate cancer HTN mother and father No FH of Colon Cancer:  Social History: Last updated: 11/14/2009 disabled due to schizophrenia - 1983 stable long-term marriage cared for his father-in-law- passed away in 09/04/22 Current Smoker Alcohol use-no Drug use-no Regular exercise-no  Risk Factors: Alcohol Use: 0 (11/14/2009) Exercise: no (11/14/2009)  Risk Factors: Smoking Status: current (11/14/2009) Packs/Day: 1.5 (11/14/2009)  Family History: Reviewed history from 02/08/2008 and no changes required. prostate cancer HTN mother and father No FH of Colon Cancer:  Social History: Reviewed history from 07/01/2008 and no changes required. disabled due to schizophrenia - 1983 stable long-term marriage cared for his father-in-law- passed away in Sep 04, 2022 Current Smoker Alcohol use-no Drug use-no Regular exercise-no Hepatitis Risk:  no risk noted HIV Risk:  no risk noted STD Risk:  no risk noted Sexual History:  currently monogamous Blood Transfusions:  no  Review of Systems  The patient denies anorexia, fever, weight loss, weight gain, hoarseness, chest pain, syncope, dyspnea on exertion, peripheral edema, prolonged cough, headaches, hemoptysis, abdominal pain, melena, hematochezia, severe indigestion/heartburn, hematuria, suspicious skin lesions, difficulty walking, depression, and angioedema.   GI:  Complains of nausea and vomiting; denies abdominal pain, bloody stools, change in bowel habits, constipation, dark tarry stools, diarrhea, excessive appetite, gas, hemorrhoids, indigestion, loss of appetite, vomiting blood, and yellowish skin color. Endo:  Denies cold intolerance, excessive hunger, excessive thirst, excessive urination, heat intolerance, polyuria, and weight change.  Physical Exam  General:  alert, well-developed, well-nourished, well-hydrated, appropriate dress,  normal appearance, healthy-appearing, cooperative to examination, good hygiene, and overweight-appearing.   Head:  normocephalic and no abnormalities palpated.   Eyes:  vision grossly intact, pupils equal, pupils round, pupils reactive to light, and no injection.   Ears:  R ear normal and L ear normal.   Nose:  External nasal examination shows no deformity or inflammation. Nasal mucosa are pink and moist without lesions or exudates. Mouth:  Oral mucosa and oropharynx without lesions or exudates.  Teeth in good repair. Neck:  supple, full ROM, no masses, no thyromegaly, no thyroid nodules or tenderness, no JVD, normal carotid upstroke, no carotid bruits, no cervical lymphadenopathy, and no neck tenderness.   Chest Wall:  no deformities, no tenderness, and no masses.   Lungs:  normal respiratory effort, no intercostal retractions, no accessory muscle use, normal breath sounds, no dullness, no fremitus, no crackles, and no wheezes.   Heart:  normal rate, regular rhythm, no murmur, no gallop, no rub, and no JVD.   Abdomen:  soft, non-tender, normal bowel sounds, no distention, no masses, no guarding, no rigidity, no rebound tenderness, no hepatomegaly, no splenomegaly, and incisional hernia.   Msk:  normal ROM, no joint tenderness, no joint swelling, no joint warmth, no redness over joints, no joint deformities, no joint instability, and no crepitation.   Pulses:  R and L carotid,radial,femoral,dorsalis pedis and posterior tibial pulses are full and equal bilaterally Extremities:  No clubbing, cyanosis, edema, or deformity noted with normal full range of motion of all joints.   Neurologic:  No cranial nerve deficits noted. Station and gait are normal. Plantar reflexes are down-going bilaterally. DTRs are symmetrical throughout. Sensory, motor and coordinative functions appear intact. Skin:  turgor normal, color normal, no rashes, no suspicious lesions, no ecchymoses, no petechiae, no purpura, no  ulcerations, and no edema.   Cervical Nodes:  No lymphadenopathy noted Axillary Nodes:  No palpable lymphadenopathy Inguinal Nodes:  No significant adenopathy Psych:  Cognition and judgment appear intact. Alert and cooperative with normal attention span and concentration. No apparent delusions, illusions, hallucinations   Impression & Recommendations:  Problem # 1:  STREP THROAT (ICD-034.0) Assessment New  The following medications were removed from the medication list:    Ibuprofen 200 Mg Tabs (Ibuprofen)    Tylenol Extra Strength 500 Mg Tabs (Acetaminophen) .[name]... As directed as needed His updated medication list for this problem includes:    Amoxicillin 500 Mg Cap (Amoxicillin) .[name]... Take 1 capsule by mouth three times a day x 10 days  Instructed to complete antibiotics and call if not improved in 48 hours.   Problem # 2:  GLUCOSE INTOLERANCE, HX OF (ICD-V12.2) Assessment: Unchanged  Orders: Venipuncture (57846) TLB-BMP (Basic Metabolic Panel-BMET) (80048-METABOL) TLB-CBC Platelet - w/Differential (85025-CBCD) TLB-Hepatic/Liver Function Pnl (80076-HEPATIC) TLB-TSH (Thyroid Stimulating Hormone) (84443-TSH) TLB-A1C / Hgb A1C (Glycohemoglobin) (83036-A1C) TLB-Lipase (83690-LIPASE) TLB-Amylase (82150-AMYL) TLB-Udip w/ Micro (81001-URINE)  Problem # 3:  PANCREATITIS, ACUTE, HX OF (ICD-V12.70) Assessment: Unchanged  Orders: Venipuncture (96295) TLB-BMP (Basic Metabolic Panel-BMET) (80048-METABOL) TLB-CBC Platelet - w/Differential (85025-CBCD) TLB-Hepatic/Liver Function Pnl (80076-HEPATIC) TLB-TSH (Thyroid Stimulating Hormone) (84443-TSH) TLB-A1C / Hgb A1C (Glycohemoglobin) (83036-A1C) TLB-Lipase (83690-LIPASE) TLB-Amylase (82150-AMYL) TLB-Udip w/ Micro (81001-URINE)  Problem # 4:  ESOPHAGITIS (ICD-530.10) Assessment: Unchanged  The following medications were removed from the medication list:    Omeprazole 40 Mg Cpdr (Omeprazole) .[name]... 1 by mouth q am His updated  medication list for this problem includes:    Dicyclomine Hcl 10 Mg Caps (Dicyclomine hcl) .[name]... 1 q 6 as needed  Problem # 5:  HYPERTENSION, ESSENTIAL NOS (ICD-401.9) Assessment: Unchanged  His updated medication list for this problem includes:    Carvedilol 12.5 Mg Tabs (Carvedilol) .[name]... 1 two times a day  Orders: Venipuncture (28413) TLB-BMP (Basic Metabolic Panel-BMET) (80048-METABOL) TLB-CBC Platelet - w/Differential (85025-CBCD) TLB-Hepatic/Liver Function Pnl (80076-HEPATIC) TLB-TSH (Thyroid Stimulating Hormone) (84443-TSH) TLB-A1C / Hgb A1C (Glycohemoglobin) (83036-A1C) TLB-Lipase (83690-LIPASE) TLB-Amylase (82150-AMYL) TLB-Udip w/ Micro (81001-URINE)  BP today: 140/88 Prior BP: 82/62 (08/05/2009)  Labs Reviewed: K+: 3.4 (08/05/2009) Creat: : 1.5 (08/05/2009)   Chol: 191 (08/08/2006)   HDL: 33.8 (08/08/2006)   LDL: 122 (08/08/2006)   TG: 177 (08/08/2006)  Complete Medication List: 1)  Thiothixene 10 Mg Caps (Thiothixene) .... 2 at bedtime 2)  Dicyclomine Hcl 10 Mg Caps (Dicyclomine hcl) .[name].. 1 q 6 as needed 3)  Carvedilol 12.5 Mg Tabs (Carvedilol) .[name].. 1 two times a day 4)  Amoxicillin 500 Mg Cap (Amoxicillin) .... Take 1 capsule by mouth three times a day x 10 days  Patient Instructions: 1)  Please schedule a follow-up appointment in 2 weeks. 2)  Tobacco is very bad for your health and your loved ones! You  Should stop smoking!. 3)  Stop Smoking Tips: Choose a Quit date. Cut down before the Quit date. decide what you will do as a substitute when you feel the urge to smoke(gum,toothpick,exercise). 4)  It is important that you exercise regularly at least 20 minutes 5 times a week. If you develop chest pain, have severe difficulty breathing, or feel very tired , stop exercising immediately and seek medical attention. 5)  You need to lose weight. Consider a lower calorie diet and regular exercise.  6)  Check your Blood Pressure regularly. If it is above 130/80: you should  make an appointment. 7)  Get plenty of rest, drink lots of clear liquids, and use Tylenol or Ibuprofen for fever and comfort. Return in 7-10 days if you're not better:sooner if you're feeling worse. 8)  Take your antibiotic as prescribed until ALL of it is gone, but stop if you develop a rash or swelling and contact our office as soon as possible. Prescriptions: AMOXICILLIN 500 MG CAP (AMOXICILLIN) Take 1 capsule by mouth three times a day X 10 days  #30 x 0   Entered and Authorized by:   Etta Grandchild MD   Signed by:   Etta Grandchild MD on 11/14/2009   Method used:   Electronically to        [facility] [phone]* (retail)       [address]       [place], [place]  [address]       Ph: [phone] or [phone]       Fax: [phone]   RxID:   [id number]

## 2010-05-19 NOTE — Progress Notes (Signed)
  Phone Note Other Incoming   Caller: Dr Abbey Chatters Summary of Call: Dr Abbey Chatters called and he performed surg on pt. Pt's pre op labs revealed that pt has low potassium at 2.7. Dr Abbey Chatters gave pt 2 potassium pill in recovery but does feel that pt needs to have adjustments made to hctz medication. Please advise Initial call taken by: Ami Bullins CMA,  August 01, 2009 11:33 AM  Follow-up for Phone Call        will see if in hospital; if already dischared will need ov for follow-up and adjustment of meds.  Follow-up by: Jacques Navy MD,  August 02, 2009 4:52 PM  Additional Follow-up for Phone Call Additional follow up Details #1::        pt will come in today because his wife has appt today.  Additional Follow-up by: Ami Bullins CMA,  August 05, 2009 2:00 PM

## 2010-05-19 NOTE — Discharge Summary (Signed)
Summary: Abdominal Pain, Nausea, Vomiting  NAME:  [name], [name]              ACCOUNT NO.:  [id number]      MEDICAL RECORD NO.:  [id number]          PATIENT TYPE:  INP      LOCATION:  1525                         FACILITY:  [facility]      PHYSICIAN:  Rosalyn Gess. Norins, MD  DATE OF BIRTH:  Dec 27, 1955      DATE OF ADMISSION:  11/07/2006   DATE OF DISCHARGE:  11/09/2006                                  DISCHARGE SUMMARY      The patient left the hospital against medical advise on November 09, 2006.      ADMISSION DIAGNOSES:   1. Abdominal pain with nausea and vomiting.   2. Dysuria with probable prostatitis.      DISCHARGE DIAGNOSIS:  The patient left against medical advise.  There   was no definitive discharge diagnosis.      The patient was to have an EGD on the day of discharge, however, he left   the hospital before the procedure could be done.      CONSULTATIONS:  Dr. Christella [name] for GI.      HISTORY OF PRESENT ILLNESS:  [name] is a 55 year old schizophrenic   gentleman who has been very stable, who presented to the office on the   day of admission complaining of a 48-hour history of nausea and   vomiting, poor p.o. intake of food and fluids.  He complained of   significant epigastric abdominal pain.  He was very uncomfortable, to   the point of tears.  Because of his discomfort and pain, he was admitted   to the hospital.      PAST MEDICAL HISTORY:  Well documented in the attached note from August 12, 2006, that is on the chart.      ADMISSION PHYSICAL EXAMINATION:  VITAL SIGNS:  Temperature of 99.5,   blood pressure 150/91, pulse 87, weight 236 pounds.   GENERAL:  This is a heavy-set African American gentleman who was   uncomfortable.   HEENT: Exam was unremarkable.   NECK:  Supple.   CHEST:  Clear.   CARDIOVASCULAR:  With 2+ radial pulses.  No JVD or carotid bruits.  He   had a regular rate and rhythm.   ABDOMEN: No bowel sounds were appreciated versus hypoactive bowel  sounds   in the lower quadrants.  He had no organosplenomegaly.  He was tender   the epigastrium.   RECTAL: Exam was unremarkable and heme-negative.   MUSCULOSKELETAL:  Exam was normal.      HOSPITAL COURSE:  The patient was admitted with acute abdominal pain.   Labs were ordered including lipase and amylase, liver function studies.   The patient also complained of dysuria and a CBC and UA were ordered.   These labs revealed the patient to have a white count of 19,700 with 81%   neutrophils, 15% lymphocytes, 4% monocytes.  Hemoglobin was 15.5 gm,   platelet count was 332,000.  Chemistries were unremarkable except for   potassium of 3.3, glucose was 130, BUN 10, creatinine 1.39.  Liver  functions were normal with a normal amylase, normal lipase.  Thyroid   function was checked and was normal with a TSH of 0.997.  Urinalysis was   negative.  The patient's abdominal pain was thought not to be pancreatic   in nature; suspected acute gastritis.  The patient did have heme   positive stool.  He was continued on PPIs and GI was called to see the   patient for further evaluation.  With leukocytosis and fever, there was   concern for possible prostatitis.  The patient was started on Rocephin.   The following morning, the patient was afebrile.  The patient was   continued on IV antibiotics.  Consult was obtained on July 22.  By July   23, the patient decided to leave the hospital against medical advice.   Because he left before being seen without opportunities for consultation   or discharge instructions, he was not continued on antibiotics.      He is to follow up in the office at his discretion.               Rosalyn Gess Norins, MD   Electronically Signed            MEN/MEDQ  D:  12/29/2006  T:  12/30/2006  Job:  [id number]

## 2010-05-19 NOTE — Progress Notes (Signed)
Summary: BP  Phone Note Call from Patient Call back at [facility] Phone [phone]   Summary of Call: Pt was seen by Dr Loree Fee, BP was 140/100. Per wife, was taken off medications and Dr Debby Bud was supposed to have pt restart? Please adivse.  Initial call taken by: Lamar Sprinkles, CMA,  Aug 18, 2009 10:17 AM  Follow-up for Phone Call        reviewed last office note: ok to resume carvedilol 12.5 mg two times a day. New Rx if needed - #60 as needed refills Follow-up by: Jacques Navy MD,  Aug 18, 2009 12:52 PM  Additional Follow-up for Phone Call Additional follow up Details #1::        Pt informed  Additional Follow-up by: Lamar Sprinkles, CMA,  Aug 18, 2009 3:07 PM    New/Updated Medications: CARVEDILOL 12.5 MG TABS (CARVEDILOL) 1 two times a day Prescriptions: CARVEDILOL 12.5 MG TABS (CARVEDILOL) 1 two times a day  #60 x 12   Entered by:   Lamar Sprinkles, CMA   Authorized by:   Jacques Navy MD   Signed by:   Lamar Sprinkles, CMA on 08/18/2009   Method used:   Electronically to        [facility] [phone]* (retail)       [address]       [place], [place]  [address]       Ph: [phone] or [phone]       Fax: [phone]   RxID:   [id number]

## 2010-05-19 NOTE — Progress Notes (Signed)
Summary: Call Report  Phone Note Other Incoming   Caller: [facility] Call Report Summary of Call: [facility] Triage Call Report Triage Record Num: [id number] Operator: Alphonsa Overall Patient Name: [name] Call Date & Time: 08/28/2009 7:14:37PM Patient Phone: [phone] PCP: Illene Regulus Patient Gender: Male PCP Fax : [phone] Patient DOB: November 05, 1955 Practice Name: [facility] Reason for Call: Dianna/caller calling about blood in stool x 2 episodes. Passing clots in stool. Hx of blood in stool. Passing normal for pt stools.Drinking well today. Feels somewhat tired but not dizzy or lightheaded. Some abdominal pain hx pancreatitis. Afebrile. Onset 08/28/09 1700. Christophor aware needs to seek care at [facility] ED within 4 hours. Will have Dianna drive. Protocol(s) Used: GI Bleeding Recommended Outcome per Protocol: See Provider within 4 hours Reason for Outcome: One or more episodes of rectal bleeding (more than scant) and no symptoms of hypovolemia Care Advice:  ~ Limit activities and increase periods of rest. May drink clear liquids (such as water, cola or other soda, tea) but do not eat solid foods prior to being seen by provider.  ~  ~ Do not change prescribed medications or dosing regimen until provider is consulted.  ~ List, or take, all current prescription(s), OTC or alternative medication(s) to provider for evaluation. Call EMS 911 if signs and symptoms of shock develop (such as unable to stand due to faintness, dizziness, or lightheadedness; new onset of confusion; slow to respond or difficult to awaken; skin is pale, gray, cool, or moist to touch; severe weakness; loss of consciousness).  ~  ~ DO NOT drive until consulting with provider. OTC anti-inflammatory medications should not be used by adults 75 years or older for longer than one week without contacting provider.  ~ Call provider immediately if symptoms of hypovolemia develop including: pulse more  than 100/minute, lightheaded or dizzy when rising from sitting/reclining position, shortness of breath, weakness with exertion, angina, or paleness.  ~  ~ CAUTIONS  Initial call taken by: Margaret Pyle, CMA,  Sep 01, 2009 8:24 AM

## 2010-05-19 NOTE — Progress Notes (Signed)
  Phone Note Outgoing Call    Follow-up for Phone Call        LA: pls tell him that his potassium level is a little low so I sent an Rx for potassium to his pharmacy, I would like to recheck his potassium level again in about 2 weeks Follow-up by: Etta Grandchild MD,  November 14, 2009 2:10 PM  Additional Follow-up for Phone Call Additional follow up Details #1::        Patient notified and will pick up.Marland KitchenMarland KitchenAlvy Beal Archie CMA  November 14, 2009 3:45 PM     New/Updated Medications: MICRO-K 10 MEQ CR-CAPS (POTASSIUM CHLORIDE) One by mouth two times a day Prescriptions: MICRO-K 10 MEQ CR-CAPS (POTASSIUM CHLORIDE) One by mouth two times a day  #60 x 5   Entered and Authorized by:   Etta Grandchild MD   Signed by:   Etta Grandchild MD on 11/14/2009   Method used:   Electronically to        [facility] [phone]* (retail)       [address]       [place], [place]  [address]       Ph: [phone] or [phone]       Fax: [phone]   RxID:   [id number]

## 2010-05-19 NOTE — Progress Notes (Signed)
Summary: BP MED  Phone Note Call from Patient   Summary of Call: Patient is requesting alternative bp med b/c he says it causes him to be unable to have an erection.  Initial call taken by: Lamar Sprinkles, CMA,  May 06, 2009 1:55 PM  Follow-up for Phone Call        Chart reviewed: he has failed lisinopril, cartia. He is on carvedilol and enalapril with the later more likely responsible.  Plan -  1.stop enalapril - see if things improve. If yes will change med. If no will resume enalapril. 2. If it is not enalapril - stop carvedilol. see if things improve. If yes will change meds, if no then it is is not BP meds and he can resume carvedilol and see me for further work-up Follow-up by: Jacques Navy MD,  May 06, 2009 6:15 PM  Additional Follow-up for Phone Call Additional follow up Details #1::        Patient notified and call back with report in 2 weeks. Additional Follow-up by: Lucious Groves,  May 07, 2009 10:23 AM

## 2010-05-19 NOTE — Assessment & Plan Note (Signed)
Summary: ER FU/ NAUSEA/NWS   Vital Signs:  Patient profile:   55 year old male Height:      71 inches Weight:      260 pounds BMI:     36.39 O2 Sat:      97 % on Room air Temp:     98.9 degrees F oral Pulse rate:   100 / minute BP sitting:   114 / 80  (left arm) Cuff size:   large  Vitals Entered By: Bill Salinas CMA (February 05, 2010 9:21 AM)  O2 Flow:  Room air CC: pt here for ER Follow up, he was given Hydrocodone 7.5-500mg  and Zofran 8mg  1 tab po bid but states he has completed these medications/ ab   Primary Care Provider:  Jacques Navy MD  CC:  pt here for ER Follow up and he was given Hydrocodone 7.5-500mg  and Zofran 8mg  1 tab po bid but states he has completed these medications/ ab.  History of Present Illness: Patient presents after ED evaluations: October 13th for abdominal pain and October 19th for nausea/vomiting. ED records, labs and images reviewed. He had a negative CT abdomen/pelvis, U/S testicles, CXR, Cmet, lipase, CBCD and was sent home the first visit with no new medications. Second visit he had repeat KUB -negative, CBCD - normal, Bmet - normal. He was sent home with antiemetic. Today he reports he feels like a new man: no complaints of abdominal pain or N/V. He postulates that he may have had a GI virus...Marland KitchenMarland Kitchenvery likely.  He reports that mental health is reducing his antipsychotic doses for safety reasons.  Current Medications (verified): 1)  Thiothixene 10 Mg  Caps (Thiothixene) .... 2 At Bedtime 2)  Carvedilol 12.5 Mg Tabs (Carvedilol) .[name].. 1 Two Times A Day 3)  Micro-K 10 Meq Cr-Caps (Potassium Chloride) .... One By Mouth Two Times A Day  Allergies (verified): 1)  ! Reglan 2)  ! Celebrex 3)  ! * Ivp Dye  Past History:  Past Medical History: Last updated: 12/27/2007 LOW BACK PAIN, CHRONIC (ICD-724.2) Hx of HEMORRHOIDS, EXTERNAL W/O COMPLICATION (ICD-455.3) SCOLIOSIS NEC (ICD-737.39) Hx of UPPER GASTROINTESTINAL HEMORRHAGE (ICD-578.9) ALLERGIC  RHINITIS (ICD-477.9) GLUCOSE INTOLERANCE, HX OF (ICD-V12.2) ANXIETY DISORDER, GENERALIZED (ICD-300.02) DEPRESSION (ICD-311) PANCREATITIS, ACUTE, HX OF (ICD-V12.70) - non alcohol per pt SCHIZOPHRENIA NEC, CHRONIC (ICD-295.82) ABSCESS, ANAL/RECTAL REGIONS (ICD-566) GERD (ICD-530.81) HYPERTENSION, ESSENTIAL NOS (ICD-401.9) Hyperlipidemia Allergic rhinitis  Past Surgical History: Last updated: 08/05/2009 excision of thyroid mass ex. lap '88-no diagnosis ventral hernia repair Laprascopic cholecystectomy Feb '10 ([facility]) excison of scalp lesion - April '11 ([facility])  Family History: Last updated: 02/08/2008 prostate cancer HTN mother and father No FH of Colon Cancer:  Social History: Last updated: 11/14/2009 disabled due to schizophrenia - 1983 stable long-term marriage cared for his father-in-law- passed away in 30-Aug-2022 Current Smoker Alcohol use-no Drug use-no Regular exercise-no  Review of Systems  The patient denies anorexia, fever, weight loss, decreased hearing, chest pain, syncope, dyspnea on exertion, abdominal pain, melena, hematochezia, muscle weakness, difficulty walking, depression, enlarged lymph nodes, and angioedema.    Physical Exam  General:  heavyset AA male in no distress Head:  normocephalic and atraumatic.   Eyes:  C&S clear Lungs:  normal respiratory effort and normal breath sounds.   Heart:  normal rate and regular rhythm.   Abdomen:  soft, non-tender, normal bowel sounds, and no guarding.   Neurologic:  alert & oriented X3 and gait normal.   Skin:  turgor normal and color normal.  Psych:  Oriented X3, memory intact for recent and remote, good eye contact, and not depressed appearing.     Impression & Recommendations:  Problem # 1:  VIRAL GASTROENTERITIS (ICD-008.8) after a very complete evaluation on two occasions the evidence does not support pancreatitis or other serious GI acute illness except for viral gastroentitis. He is now better.     Complete Medication List: 1)  Thiothixene 10 Mg Caps (Thiothixene) .... 2 at bedtime 2)  Carvedilol 12.5 Mg Tabs (Carvedilol) .[name].. 1 two times a day 3)  Micro-k 10 Meq Cr-caps (Potassium chloride) .... One by mouth two times a day   Orders Added: 1)  Est. Patient Level IV [16109]

## 2010-05-19 NOTE — Assessment & Plan Note (Signed)
Summary: adjustment of potassium medication/ ab   Vital Signs:  Patient profile:   55 year old male Height:      71 inches (180.34 cm) Weight:      263 pounds (119.55 kg) BMI:     36.81 O2 Sat:      94 % on Room air Temp:     99.5 degrees F (37.50 degrees C) oral Pulse rate:   99 / minute Pulse rhythm:   regular BP sitting:   82 / 62  (left arm) Cuff size:   large  Vitals Entered By: Brenton Grills (August 05, 2009 3:09 PM)  O2 Flow:  Room air CC: pt here for adjustment of postassium medication/aj   Primary Care Provider:  Jacques Navy MD  CC:  pt here for adjustment of postassium medication/aj.  History of Present Illness: Patient presents for medication follow-up and follow-up of low potassium. Chart reviewed: he was last seen in Dec '09: at that time he was on enalapril which was increased from 10 to 20mg , Jan '11 he called and was concerned about impotence. He was told to stop enalapril - which he did. This did not make any difference in regard to his impotence. He did not, as the not indicated, stop the carvedilol. While in hospital for scalp lesion he was hypokalemic. He also went to [facility] last night for BP and was started on lisinopril/hct (which had failed in the past) and today he is hypotensive. He continues to complain of importence.   Current Medications (verified): 1)  Thiothixene 10 Mg  Caps (Thiothixene) .... 2 At Bedtime 2)  Omeprazole 40 Mg Cpdr (Omeprazole) .[name].. 1 By Mouth Q Am 3)  Ibuprofen 200 Mg Tabs (Ibuprofen) 4)  Tylenol Extra Strength 500 Mg  Tabs (Acetaminophen) .... As Directed As Needed 5)  Furosemide 40 Mg Tabs (Furosemide) .[name].. 1 By Mouth Qam 6)  Vitamin D3 1000 Unit  Tabs (Cholecalciferol) .[name].. 1 By Mouth Daily 7)  Chantix Starting Month Pak 0.5 Mg X 11 & 1 Mg X 42 Tabs (Varenicline Tartrate) .... Take As Directed. 8)  Dicyclomine Hcl 10 Mg Caps (Dicyclomine Hcl) .[name].. 1 Q 6 As Needed 9)  Carvedilol 12.5 Mg Tabs (Carvedilol) .[name].. 1  By Mouth Two Times A Day 10)  Enalapril Maleate 20 Mg Tabs (Enalapril Maleate) .[name].. 1 By Mouth Once Daily  Allergies (verified): 1)  ! Reglan 2)  ! Celebrex 3)  ! * Ivp Dye  Past History:  Past Medical History: Last updated: 12/27/2007 LOW BACK PAIN, CHRONIC (ICD-724.2) Hx of HEMORRHOIDS, EXTERNAL W/O COMPLICATION (ICD-455.3) SCOLIOSIS NEC (ICD-737.39) Hx of UPPER GASTROINTESTINAL HEMORRHAGE (ICD-578.9) ALLERGIC RHINITIS (ICD-477.9) GLUCOSE INTOLERANCE, HX OF (ICD-V12.2) ANXIETY DISORDER, GENERALIZED (ICD-300.02) DEPRESSION (ICD-311) PANCREATITIS, ACUTE, HX OF (ICD-V12.70) - non alcohol per pt SCHIZOPHRENIA NEC, CHRONIC (ICD-295.82) ABSCESS, ANAL/RECTAL REGIONS (ICD-566) GERD (ICD-530.81) HYPERTENSION, ESSENTIAL NOS (ICD-401.9) Hyperlipidemia Allergic rhinitis  Social History: Last updated: 07/01/2008 disabled due to schizophrenia - 1983 stable long-term marriage cares for his father-in-law- passed away in 2022/08/30 Current Smoker Alcohol use-no  Past Surgical History: excision of thyroid mass ex. lap '88-no diagnosis ventral hernia repair Laprascopic cholecystectomy Feb '10 ([facility]) excison of scalp lesion - April '11 ([facility])  Family History: Reviewed history from 02/08/2008 and no changes required. prostate cancer HTN mother and father No FH of Colon Cancer:  Social History: Reviewed history from 07/01/2008 and no changes required. disabled due to schizophrenia - 1983 stable long-term marriage cares for his father-in-law- passed away in 2022/08/30  Current Smoker Alcohol use-no  Review of Systems  The patient denies anorexia, fever, weight loss, weight gain, vision loss, hoarseness, chest pain, dyspnea on exertion, peripheral edema, headaches, abdominal pain, incontinence, muscle weakness, transient blindness, depression, unusual weight change, and enlarged lymph nodes.    Physical Exam  General:  WNWD AA male in no distress Head:  steri-strip to recent  surgical wound right parietal scalp. No drainage, no swelling Eyes:  pupils equal, pupils round, corneas and lenses clear, and no injection.   Neck:  supple.   Lungs:  normal respiratory effort.   Heart:  normal rate and regular rhythm.   Msk:  No deformity or scoliosis noted of thoracic or lumbar spine.   Pulses:  2+ radial Neurologic:  alert & oriented X3, cranial nerves II-XII intact, and gait normal.   Skin:  turgor normal and color normal.   Psych:  Oriented X3 and memory intact for recent and remote.  His usual affect, good eye contact. He seems emotionally stable.    Impression & Recommendations:  Problem # 1:  LIPOMA OF SKIN AND SUBCUTANEOUS TISSUE OF FACE (ICD-214.0) s/p excision on April 15th- doing well.  Problem # 2:  HYPERTENSION, ESSENTIAL NOS (ICD-401.9)  The following medications were removed from the medication list:    Furosemide 40 Mg Tabs (Furosemide) .[name]... 1 by mouth qam    Carvedilol 12.5 Mg Tabs (Carvedilol) .[name]... 1 by mouth two times a day    Enalapril Maleate 20 Mg Tabs (Enalapril maleate) .[name]... 1 by mouth once daily  Orders: TLB-BMP (Basic Metabolic Panel-BMET) (80048-METABOL)  BP today: 82/62 Prior BP: 132/98 (03/27/2009)  Low BP today. He has had new meds from [facility]. He also c/o impotence.  Plan - stop all BP meds at this time.           Monitor blood pressure- as BP rises will add back medication one a time to assess efficacy and side effects.  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 This is probably multi-factorial with medications playing a big role.  Plan - stop all BP meds           He must continue psychotropic meds - which may be playing a major role           trial of cialis.   His updated medication list for this problem includes:    Cialis 20 Mg Tabs (Tadalafil) .[name]... 1 by mouth as needed.  Problem # 4:  SMOKER (ICD-305.1)  Patinet has tried chantix but was  unable to stop smoking. We discussed this at length including the self-medicating properties of tobacco/nicotine in schizophrenics.  Plan - he is encouraged to call 1-800-quit-now           new Rx for chantix starter pak.  His updated medication list for this problem includes:    Chantix Starting Month Pak 0.5 Mg X 11 & 1 Mg X 42 Tabs (Varenicline tartrate) .[name]... Take as directed.  Orders: Tobacco use cessation intensive >10 minutes (30865)  Complete Medication List: 1)  Thiothixene 10 Mg Caps (Thiothixene) .... 2 at bedtime 2)  Omeprazole 40 Mg Cpdr (Omeprazole) .[name].. 1 by mouth q am 3)  Ibuprofen 200 Mg Tabs (Ibuprofen) 4)  Tylenol Extra Strength 500 Mg Tabs (Acetaminophen) .... As directed as needed 5)  Vitamin D3 1000 Unit Tabs (Cholecalciferol) .[name].. 1 by mouth daily 6)  Chantix Starting Month Pak 0.5 Mg X 11 & 1 Mg X 42  Tabs (Varenicline tartrate) .... Take as directed. 7)  Dicyclomine Hcl 10 Mg Caps (Dicyclomine hcl) .[name].. 1 q 6 as needed 8)  Cialis 20 Mg Tabs (Tadalafil) .[name].. 1 by mouth as needed.  Patient Instructions: 1)  Blood pressure - too low today and there is a question of the medicine causing impotence. Plan - stop all blood pressure medicines. We will see if you have better erections. We also need to watch your Blood pressure. If it starts to go back up you need to let me know.  2)  Low potassium - will check lab today 3)  Smoking cessation - you need the counseling sessions with "Quit NOw" and the chantix. Let me know when you have been able to go to the session.  Prescriptions: CHANTIX STARTING MONTH PAK 0.5 MG X 11 & 1 MG X 42 TABS (VARENICLINE TARTRATE) take as directed.  #1 pak x 0   Entered and Authorized by:   Jacques Navy MD   Signed by:   Jacques Navy MD on 08/05/2009   Method used:   Electronically to        [facility] [phone]* (retail)       [address]       [place], [place]  [address]       Ph: [phone] or  [phone]       Fax: [phone]   RxID:   [id number] CIALIS 20 MG TABS (TADALAFIL) 1 by mouth as needed.  #6 x 4   Entered and Authorized by:   Jacques Navy MD   Signed by:   Jacques Navy MD on 08/05/2009   Method used:   Electronically to        [facility] [phone]* (retail)       [address]       [place], [place]  [address]       Ph: [phone] or [phone]       Fax: [phone]   RxID:   [id number]   Prevention & Chronic Care Immunizations   Influenza vaccine: Not documented    Tetanus booster: 04/19/1992: given    Pneumococcal vaccine: Not documented  Colorectal Screening   Hemoccult: Not documented    Colonoscopy: Dr. Brodie:Normal colonoscopy. Hemorrhoids present.  (11/18/1998)   Colonoscopy due: 11/2008  Other Screening   PSA: 2.23  (08/08/2006)   Smoking status: current  (12/27/2007)   Smoking cessation counseling: yes  (01/24/2007)  Lipids   Total Cholesterol: 191  (08/08/2006)   LDL: 122  (08/08/2006)   LDL Direct: Not documented   HDL: 33.8  (08/08/2006)   Triglycerides: 177  (08/08/2006)    SGOT (AST): 18  (12/27/2007)   SGPT (ALT): 19  (12/27/2007)   Alkaline phosphatase: 72  (12/27/2007)   Total bilirubin: 0.7  (12/27/2007)  Hypertension   Last Blood Pressure: 82 / 62  (08/05/2009)   Serum creatinine: 1.5  (03/24/2009)   Serum potassium 4.3  (03/24/2009)  Self-Management Support :    Hypertension self-management support: Not documented    Lipid self-management support: Not documented

## 2010-05-19 NOTE — Progress Notes (Signed)
  Phone Note Call from Patient Call back at [facility] Phone [phone]   Summary of Call: Patient is requesting results of labs and xray when avail.  Initial call taken by: Lamar Sprinkles, CMA,  February 27, 2010 4:52 PM  Follow-up for Phone Call        normal xrays Follow-up by: Etta Grandchild MD,  March 01, 2010 7:53 PM  Additional Follow-up for Phone Call Additional follow up Details #1::        Pt informed  Additional Follow-up by: Lamar Sprinkles, CMA,  March 02, 2010 10:26 AM

## 2010-05-21 NOTE — Procedures (Signed)
Summary: Colonoscopy  Patient: [name] Note: All result statuses are Final unless otherwise noted.  Tests: (1) Colonoscopy (COL)   COL Colonoscopy           DONE     [facility]     [address]     [place], [place]  [address]           COLONOSCOPY PROCEDURE REPORT           PATIENT:  [name], [name]  MR#:  [id number]     BIRTHDATE:  1955/09/08, 54 yrs. old  GENDER:  male     ENDOSCOPIST:  Hedwig Morton. Juanda Chance, MD     REF. BY:  Rosalyn Gess. Norins, M.D.     PROCEDURE DATE:  04/23/2010     PROCEDURE:  Colonoscopy 29562     ASA CLASS:  Class II     INDICATIONS:  hematochezia     MEDICATIONS:   Versed 7 mg, Fentanyl 25 mcg           DESCRIPTION OF PROCEDURE:   After the risks benefits and     alternatives of the procedure were thoroughly explained, informed     consent was obtained.  Digital rectal exam was performed and     revealed no rectal masses.   The LB [id number]-[id number] endoscope     was introduced through the anus and advanced to the cecum, which     was identified by both the appendix and ileocecal valve, without     limitations.  The quality of the prep was good, using MiraLax.     The instrument was then slowly withdrawn as the colon was fully     examined.     <<PROCEDUREIMAGES>>           FINDINGS:  Internal hemorrhoids were found (see image6 and     image5). inflamed int hems, bleeding on contact with the scope     Otherwise normal colonoscopy without other polyps, masses, vascular     ectasias, or inflammatory changes (see image4, image3, image2, and     image1).   Retroflexed views in the rectum revealed no     abnormalities.    The scope was then withdrawn from the patient     and the procedure completed.           COMPLICATIONS:  None     ENDOSCOPIC IMPRESSION:     1) Internal hemorrhoids     2) Otherwise nl colonoscopy WMO     RECOMMENDATIONS:     Anusol HC supp     bleeding likely from the hemorrhoids, consider hem band ligation        REPEAT EXAM:  In 10 year(s) for.           ______________________________     Hedwig Morton. Juanda Chance, MD           CC:           n.     eSIGNED:   Hedwig Morton. Brodie at 04/23/2010 04:19 PM           [name], [id number]  Note: An exclamation mark (!) indicates a result that was not dispersed into the flowsheet. Document Creation Date: 04/23/2010 4:19 PM _______________________________________________________________________  (1) Order result status: Final Collection or observation date-time: 04/23/2010 16:02 Requested date-time:  Receipt date-time:  Reported date-time:  Referring Physician:   Ordering Physician: Lina Sar [phone]) Specimen Source:  Source: Launa Grill Order Number: [id number] Lab  site:   Appended Document: Colonoscopy    Clinical Lists Changes  Observations: Added new observation of COLONNXTDUE: 04/2020 (04/23/2010 16:59)

## 2010-05-21 NOTE — Procedures (Signed)
Summary: Upper Endoscopy  Patient: [name] Note: All result statuses are Final unless otherwise noted.  Tests: (1) Upper Endoscopy (EGD)   EGD Upper Endoscopy       DONE     [facility]     [address]     [place], [place]  [address]           ENDOSCOPY PROCEDURE REPORT           PATIENT:  [name], [name]  MR#:  [id number]     BIRTHDATE:  1955/11/28, 54 yrs. old  GENDER:  male           ENDOSCOPIST:  Hedwig Morton. Juanda Chance, MD     Referred by:  Rosalyn Gess. Norins, M.D.           PROCEDURE DATE:  04/23/2010     PROCEDURE:  EGD, diagnostic 43235     ASA CLASS:  Class II     INDICATIONS:  GERD now controlled on Nexiem 40 mg     last EGD 2000 showed esophagitis, no barrett's           MEDICATIONS:   Fentanyl 50 mcg     TOPICAL ANESTHETIC:  Exactacain Spray           DESCRIPTION OF PROCEDURE:   After the risks benefits and     alternatives of the procedure were thoroughly explained, informed     consent was obtained.  The LB GIF-H180 [id number] endoscope was     introduced through the mouth and advanced to the second portion of     the duodenum, without limitations.  The instrument was slowly     withdrawn as the mucosa was fully examined.     <<PROCEDUREIMAGES>>           A hiatal hernia was found (see image5). 1-2 cm sliding hiatal     hernia  Otherwise the examination was normal (see image4, image3,     image2, and image1).    Retroflexed views revealed no     abnormalities.    The scope was then withdrawn from the patient     and the procedure completed.           COMPLICATIONS:  None           ENDOSCOPIC IMPRESSION:     1) Hiatal hernia     2) Otherwise normal examination     RECOMMENDATIONS:     1) Anti-reflux regimen to be follow     continue Nexiem 40 mg daily           REPEAT EXAM:  In 0 year(s) for.           ______________________________     Hedwig Morton. Juanda Chance, MD           CC:           n.     eSIGNED:   Hedwig Morton. Rhiley Tarver at 04/23/2010 04:10 PM       [name], [id number]  Note: An exclamation mark (!) indicates a result that was not dispersed into the flowsheet. Document Creation Date: 04/23/2010 4:11 PM _______________________________________________________________________  (1) Order result status: Final Collection or observation date-time: 04/23/2010 15:39 Requested date-time:  Receipt date-time:  Reported date-time:  Referring Physician:   Ordering Physician: Lina Sar [phone]) Specimen Source:  Source: Launa Grill Order Number: [id number] Lab site:

## 2010-05-21 NOTE — Progress Notes (Signed)
Summary: Change PPI  Phone Note Outgoing Call   Call placed by: Lamona Curl CMA Duncan Dull),  March 30, 2010 11:19 AM Call placed to: Patient Summary of Call: Called patient. His insurance will not cover Nexium unless has has tried omeprazole (he did take this in 2009) or OTC medication (he did take Zantac without relief). He must also try lansopraolze before Nexium will be covered. Patient states he has never tried lansoprazole. I will send in a new prescription for lansoprazole in place of Nexium and patient will call me back if the lansoprazole does not work for him. Initial call taken by: Lamona Curl CMA (AAMA),  March 30, 2010 11:21 AM    New/Updated Medications: LANSOPRAZOLE 30 MG CPDR (LANSOPRAZOLE) Take 1 tablet by mouth once a day. PHARMACY-Please d/c prescription for ranitadine and nexium as insurance will not cover Prescriptions: LANSOPRAZOLE 30 MG CPDR (LANSOPRAZOLE) Take 1 tablet by mouth once a day. PHARMACY-Please d/c prescription for ranitadine and nexium as insurance will not cover  #30 x 2   Entered by:   Lamona Curl CMA (AAMA)   Authorized by:   Hart Carwin MD   Signed by:   Lamona Curl CMA (AAMA) on 03/30/2010   Method used:   Electronically to        [facility] [phone]* (retail)       [address]       [place], [place]  [address]       Ph: [phone] or [phone]       Fax: [phone]   RxID:   [id number]

## 2010-05-21 NOTE — Assessment & Plan Note (Signed)
Summary: BOILS/GROIN  OK PER AMI  STC   Vital Signs:  Patient profile:   55 year old male Height:      71 inches Weight:      251 pounds BMI:     35.13 O2 Sat:      97 % on Room air Temp:     99.4 degrees F oral Pulse rate:   87 / minute BP sitting:   150 / 110  (left arm) Cuff size:   large  Vitals Entered By: Bill Salinas CMA (March 30, 2010 9:02 AM)  O2 Flow:  Room air CC: pt here with c/o boil near groin, pt also states that he has decreased appetite. Pt is sch for endo/colon jan 5th with Dr Juanda Chance ab   Primary Care Ronesha Heenan:  Jacques Navy MD  CC:  pt here with c/o boil near groin and pt also states that he has decreased appetite. Pt is sch for endo/colon jan 5th with Dr Juanda Chance ab.  History of Present Illness: Patient reports a h/o recurrent "boils" in the intertriginous area around the scrotum and in the perineal area. He has had I&D in the past. He also will lance the lesions himself with a "flammed needle." He currently has several resolving lesions.  He was recently seen Dec 8th by Dr. Lina Sar and is scheduled for EGD and Colonoscopy for Jan 5th, 2012. He has dyspepsia and weight loss.   Current Medications (verified): 1)  Thiothixene 10 Mg  Caps (Thiothixene) .... 2 At Bedtime 2)  Carvedilol 12.5 Mg Tabs (Carvedilol) .[name].. 1 Two Times A Day 3)  Micro-K 10 Meq Cr-Caps (Potassium Chloride) .... One By Mouth Two Times A Day 4)  Nexium 40 Mg Cpdr (Esomeprazole Magnesium) .... Take 1 Tablet By Mouth Once Daily (Pharmacy-Please D/c Prescription For Ranitadine) 5)  Miralax   Powd (Polyethylene Glycol 3350) .... As Per Prep  Instructions. 6)  Dulcolax 5 Mg  Tbec (Bisacodyl) .... Day Before Procedure Take 2 At 3pm and 2 At 8pm.  Allergies (verified): 1)  ! Reglan 2)  ! Celebrex 3)  ! * Ivp Dye PMH-FH-SH reviewed-no changes except otherwise noted  Review of Systems       The patient complains of weight loss, severe indigestion/heartburn, and genital sores.  The  patient denies anorexia, fever, vision loss, hoarseness, chest pain, syncope, dyspnea on exertion, peripheral edema, prolonged cough, abdominal pain, muscle weakness, suspicious skin lesions, depression, and enlarged lymph nodes.    Physical Exam  General:  Well-developed,well-nourished,in no acute distress; alert,appropriate and cooperative throughout examination Head:  normocephalic and atraumatic.   Eyes:  C&S clear Neck:  supple.   Lungs:  normal respiratory effort.   Heart:  normal rate and regular rhythm.   Skin:  there were several small raised areas that had the appearance of mildly infected hair follicles in the area of the perineum and on the side of the scrotum. Cervical Nodes:  no lympadenopathy in the groin Psych:  Oriented X3, normally interactive, and good eye contact.     Impression & Recommendations:  Problem # 1:  CARBUNCLE AND FURUNCLE OF OTHER SPECIFIED SITES (ICD-680.8) Patient claims a recurrent h/o lesions since he was 42. Currently no advanced lesion that requires any treatment  Plan - keep the perineum clean and dry           avoid chaffing           for recurrent lesion(s)- sitz baths. Do not manipulate or use  needles to drain lesions.   Complete Medication List: 1)  Thiothixene 10 Mg Caps (Thiothixene) .... 2 at bedtime 2)  Carvedilol 12.5 Mg Tabs (Carvedilol) .[name].. 1 two times a day 3)  Micro-k 10 Meq Cr-caps (Potassium chloride) .... One by mouth two times a day 4)  Nexium 40 Mg Cpdr (Esomeprazole magnesium) .... Take 1 tablet by mouth once daily (pharmacy-please d/c prescription for ranitadine) 5)  Miralax Powd (Polyethylene glycol 3350) .... As per prep  instructions. 6)  Dulcolax 5 Mg Tbec (Bisacodyl) .... Day before procedure take 2 at 3pm and 2 at 8pm.   Orders Added: 1)  Est. Patient Level III [16109]

## 2010-05-21 NOTE — Miscellaneous (Signed)
Summary: anusol supp.rx.  Clinical Lists Changes  Medications: Added new medication of ANUSOL-HC 25 MG  SUPP (HYDROCORTISONE ACETATE) 0ne suppository every night. - Signed Rx of ANUSOL-HC 25 MG  SUPP (HYDROCORTISONE ACETATE) 0ne suppository every night.;  #1 x 3;  Signed;  Entered by: Darlyn Read RN;  Authorized by: Hart Carwin MD;  Method used: [facility] to [facility] [address] [phone]*, [address] [place], [place], [place]  [id number], Ph: [phone] or [phone], Fax: [phone]    Prescriptions: ANUSOL-HC 25 MG  SUPP (HYDROCORTISONE ACETATE) 0ne suppository every night.  #1 x 3   Entered by:   Darlyn Read RN   Authorized by:   Hart Carwin MD   Signed by:   Darlyn Read RN on 04/23/2010   Method used:   [facility]  [address] [phone]* (retail)       [address]       [place], [place]  [id number]       Ph: [phone] or [phone]       Fax: [phone]   RxID:   [id number]

## 2010-05-21 NOTE — Assessment & Plan Note (Signed)
Summary: blood in stool...as.   History of Present Illness Visit Type: Follow-up Consult Primary GI MD: Lina Sar M.D. Primary Provider: Jacques Navy MD Requesting Provider: Sanda Linger, MD Chief Complaint: Intermittant rectal bleeding with diarrhea. Pt has some epigastric pain and burning sensatio in chest.  History of Present Illness:   This is a 55 year old African American male with low-volume hematochezia and a history of internal hemorrhoids. His last colonoscopy in August 2000 showed  internal hemorrhoids. He also has a history of gastroesophageal reflux and severe esophagitis found on his last upper endoscopy in September 2009. There was no evidence of Barrett's esophagus. He had a laparoscopic cholecystectomy in 05/21/08 complicated by post operative pancreatitis. His most recent CT scan of the abdomen in October 2011 to rule out kidney stones did not show any significant lesion. He has intermittent rectal bleeding but denies any rectal pain.   GI Review of Systems    Reports abdominal pain, acid reflux, nausea, and  weight loss.     Location of  Abdominal pain: epigastric area. Weight loss of 10 pounds over 1 month.   Denies belching, bloating, chest pain, dysphagia with liquids, dysphagia with solids, heartburn, loss of appetite, vomiting, vomiting blood, and  weight gain.      Reports diarrhea and  rectal bleeding.     Denies anal fissure, black tarry stools, change in bowel habit, constipation, diverticulosis, fecal incontinence, heme positive stool, hemorrhoids, irritable bowel syndrome, jaundice, light color stool, liver problems, and  rectal pain.    Current Medications (verified): 1)  Thiothixene 10 Mg  Caps (Thiothixene) .... 2 At Bedtime 2)  Carvedilol 12.5 Mg Tabs (Carvedilol) .[name].. 1 Two Times A Day 3)  Micro-K 10 Meq Cr-Caps (Potassium Chloride) .... One By Mouth Two Times A Day 4)  Ranitidine Hcl 300 Mg Caps (Ranitidine Hcl) .... One By Mouth Once Daily For  Heartburn  Allergies (verified): 1)  ! Reglan 2)  ! Celebrex 3)  ! * Ivp Dye  Past History:  Past Medical History: Reviewed history from 12/27/2007 and no changes required. LOW BACK PAIN, CHRONIC (ICD-724.2) Hx of HEMORRHOIDS, EXTERNAL W/O COMPLICATION (ICD-455.3) SCOLIOSIS NEC (ICD-737.39) Hx of UPPER GASTROINTESTINAL HEMORRHAGE (ICD-578.9) ALLERGIC RHINITIS (ICD-477.9) GLUCOSE INTOLERANCE, HX OF (ICD-V12.2) ANXIETY DISORDER, GENERALIZED (ICD-300.02) DEPRESSION (ICD-311) PANCREATITIS, ACUTE, HX OF (ICD-V12.70) - non alcohol per pt SCHIZOPHRENIA NEC, CHRONIC (ICD-295.82) ABSCESS, ANAL/RECTAL REGIONS (ICD-566) GERD (ICD-530.81) HYPERTENSION, ESSENTIAL NOS (ICD-401.9) Hyperlipidemia Allergic rhinitis  Past Surgical History: Reviewed history from 08/05/2009 and no changes required. excision of thyroid mass ex. lap '88-no diagnosis ventral hernia repair Laprascopic cholecystectomy Feb '10 ([facility]) excison of scalp lesion - April '11 ([facility])  Family History: Reviewed history from 02/08/2008 and no changes required. prostate cancer HTN mother and father No FH of Colon Cancer:  Social History: Reviewed history from 11/14/2009 and no changes required. disabled due to schizophrenia - 1983 stable long-term marriage cared for his father-in-law- passed away in Sep 20, 2022 Current Smoker Alcohol use-no Drug use-no Regular exercise-no  Review of Systems  The patient denies allergy/sinus, anemia, anxiety-new, arthritis/joint pain, back pain, blood in urine, breast changes/lumps, change in vision, confusion, cough, coughing up blood, depression-new, fainting, fatigue, fever, headaches-new, hearing problems, heart murmur, heart rhythm changes, itching, menstrual pain, muscle pains/cramps, night sweats, nosebleeds, pregnancy symptoms, shortness of breath, skin rash, sleeping problems, sore throat, swelling of feet/legs, swollen lymph glands, thirst - excessive , urination -  excessive , urination changes/pain, urine leakage, vision changes, and voice change.  Pertinent positive and negative review of systems were noted in the above HPI. All other ROS was otherwise negative.   Vital Signs:  Patient profile:   55 year old male Height:      71 inches Weight:      255.25 pounds BMI:     35.73 Pulse rate:   80 / minute Pulse rhythm:   regular BP sitting:   148 / 92  (right arm) Cuff size:   large  Vitals Entered By: Christie Nottingham CMA Duncan Dull) (March 26, 2010 2:22 PM)  Physical Exam  General:  alert, oriented and in no distress. Eyes:  nonicteric. Mouth:  normal mucosa. Neck:  Supple; no masses or thyromegaly. Lungs:  Clear throughout to auscultation. Heart:  Regular rate and rhythm; no murmurs, rubs,  or bruits. Abdomen:  large protuberant abdomen with well-healed surgical scar. Normoactive bowel sounds. No tenderness. Liver edge at costal margin, ventral incisional hernia easily reducible. Rectal:  normal perianal area. Normal rectal sphincter tone. Soft Hemoccult negative stool. Extremities:  No clubbing, cyanosis, edema or deformities noted.   Impression & Recommendations:  Problem # 1:  BLOOD IN STOOL (ICD-578.1) Patient has painless hematochezia. I  could not be reproduced any bleeding on today's exam. He has a history of internal hemorrhoids. His last colonoscopy was more than 11 years ago. We will proceed with a colonoscopy to rule out colon neoplasm.  Orders: Colon/Endo (Colon/Endo)  Problem # 2:  ABDOMINAL PAIN, EPIGASTRIC (ICD-789.06) Patient has gastroesophageal reflux with a history of severe esophagitis. There is no evidence of Barrett's esophagus on his last endoscopy in 2009. We will switch him from Zantac to Nexium 40 mg daily to achieve better acid suppression. We will also proceed with an upper endoscopy.  Problem # 3:  PANCREATITIS, ACUTE, HX OF (ICD-V12.70) Patient has a history of biliary pancreatitis. This is currently  not a problem.  Patient Instructions: 1)  You have been scheduled for an endoscopy to rule out Barrett's esophagus and a colonoscopy for screening. Please follow written prep instructions that were given to you today at your visit.  2)  Please take Nexium 40 mg daily in place of ranitidine. We have sent a prescription to your pharmacy 3)  Copy sent to : Dr Debby Bud 4)  The medication list was reviewed and reconciled.  All changed / newly prescribed medications were explained.  A complete medication list was provided to the patient / caregiver. Prescriptions: DULCOLAX 5 MG  TBEC (BISACODYL) Day before procedure take 2 at 3pm and 2 at 8pm.  #4 x 0   Entered by:   Lamona Curl CMA (AAMA)   Authorized by:   Hart Carwin MD   Signed by:   Lamona Curl CMA (AAMA) on 03/26/2010   Method used:   Electronically to        [facility] [phone]* (retail)       [address]       [place], [place]  [address]       Ph: [phone] or [phone]       Fax: [phone]   RxID:   [id number] REGLAN 10 MG  TABS (METOCLOPRAMIDE HCL) As per prep instructions.  #2 x 0   Entered by:   Lamona Curl CMA (AAMA)   Authorized by:   Hart Carwin MD   Signed by:   Lamona Curl CMA (AAMA) on 03/26/2010   Method used:   Electronically to  [facility] [phone]* (retail)       [address]       [place], [place]  [address]       Ph: [phone] or [phone]       Fax: [phone]   RxID:   [id number] MIRALAX   POWD (POLYETHYLENE GLYCOL 3350) As per prep  instructions.  #255gm x 0   Entered by:   Lamona Curl CMA (AAMA)   Authorized by:   Hart Carwin MD   Signed by:   Lamona Curl CMA (AAMA) on 03/26/2010   Method used:   Electronically to        [facility] [phone]* (retail)       [address]       [place], [place]  [address]       Ph: [phone] or [phone]        Fax: [phone]   RxID:   [id number] NEXIUM 40 MG CPDR (ESOMEPRAZOLE MAGNESIUM) Take 1 tablet by mouth once daily (pharmacy-please d/c prescription for ranitadine)  #30 x 2   Entered by:   Lamona Curl CMA (AAMA)   Authorized by:   Hart Carwin MD   Signed by:   Lamona Curl CMA (AAMA) on 03/26/2010   Method used:   Electronically to        [facility] [phone]* (retail)       [address]       [place], [place]  [address]       Ph: [phone] or [phone]       Fax: [phone]   RxID:   [id number]  Prescription for reglan discontinued. I have spoken to [facility] who verbalizes understanding not to fill reglan Prescription for prep. Dottie Nelson-Smith CMA Duncan Dull)  March 26, 2010 3:44 PM

## 2010-07-01 LAB — CBC
HCT: 49.7 % (ref 39.0–52.0)
Hemoglobin: 16.8 g/dL (ref 13.0–17.0)
Hemoglobin: 14.9 g/dL (ref 13.0–17.0)
MCH: 30.3 pg (ref 26.0–34.0)
MCHC: 33.8 g/dL (ref 30.0–36.0)
Platelets: 277 10*3/uL (ref 150–400)
RBC: 4.93 MIL/uL (ref 4.22–5.81)
RDW: 15 % (ref 11.5–15.5)
WBC: 8.7 10*3/uL (ref 4.0–10.5)
WBC: 9.5 10*3/uL (ref 4.0–10.5)

## 2010-07-01 LAB — COMPREHENSIVE METABOLIC PANEL
ALT: 22 U/L (ref 0–53)
AST: 22 U/L (ref 0–37)
Alkaline Phosphatase: 81 U/L (ref 39–117)
CO2: 28 mEq/L (ref 19–32)
Calcium: 9.5 mg/dL (ref 8.4–10.5)
GFR calc Af Amer: >60 mL/min (ref >60)
Glucose, Bld: 118 mg/dL — ABNORMAL HIGH (ref 70–99)
Potassium: 4 mEq/L (ref 3.5–5.1)
Sodium: 139 mEq/L (ref 135–145)
Total Protein: 8.1 g/dL (ref 6.0–8.3)

## 2010-07-01 LAB — DIFFERENTIAL
Basophils Relative: 0 % (ref 0–1)
Eosinophils Absolute: 0.1 10*3/uL (ref 0.0–0.7)
Eosinophils Relative: 1 % (ref 0–5)
Lymphocytes Relative: 30 % (ref 12–46)
Lymphs Abs: 1.7 10*3/uL (ref 0.7–4.0)
Lymphs Abs: 2.9 10*3/uL (ref 0.7–4.0)
Monocytes Relative: 4 % (ref 3–12)
Monocytes Relative: 6 % (ref 3–12)
Neutro Abs: 5.8 10*3/uL (ref 1.7–7.7)
Neutrophils Relative %: 76 % (ref 43–77)
Neutrophils Relative %: 62 % (ref 43–77)

## 2010-07-01 LAB — URINALYSIS, ROUTINE W REFLEX MICROSCOPIC
Bilirubin Urine: NEGATIVE
Glucose, UA: NEGATIVE mg/dL
Hgb urine dipstick: NEGATIVE
Hgb urine dipstick: NEGATIVE
Nitrite: NEGATIVE
Specific Gravity, Urine: 1.019 (ref 1.005–1.030)
Specific Gravity, Urine: 1.012 (ref 1.005–1.030)
Urobilinogen, UA: 0.2 mg/dL (ref 0.0–1.0)
pH: 7 (ref 5.0–8.0)

## 2010-07-01 LAB — BASIC METABOLIC PANEL
CO2: 29 mEq/L (ref 19–32)
Calcium: 9.5 mg/dL (ref 8.4–10.5)
Creatinine, Ser: 1.25 mg/dL (ref 0.4–1.5)
GFR calc Af Amer: >60 mL/min (ref >60)
Sodium: 140 mEq/L (ref 135–145)

## 2010-07-07 LAB — COMPREHENSIVE METABOLIC PANEL
AST: 20 U/L (ref 0–37)
CO2: 34 mEq/L — ABNORMAL HIGH (ref 19–32)
Calcium: 9.7 mg/dL (ref 8.4–10.5)
Chloride: 96 mEq/L (ref 96–112)
Creatinine, Ser: 1.41 mg/dL (ref 0.4–1.5)
GFR calc Af Amer: >60 mL/min (ref >60)
GFR calc non Af Amer: 53 mL/min — ABNORMAL LOW (ref >60)
Glucose, Bld: 100 mg/dL — ABNORMAL HIGH (ref 70–99)
Total Bilirubin: 0.6 mg/dL (ref 0.3–1.2)

## 2010-07-07 LAB — CBC
HCT: 43.8 % (ref 39.0–52.0)
Hemoglobin: 14.9 g/dL (ref 13.0–17.0)
MCHC: 34 g/dL (ref 30.0–36.0)
MCV: 89.4 fL (ref 78.0–100.0)
RBC: 4.9 MIL/uL (ref 4.22–5.81)
WBC: 8.8 10*3/uL (ref 4.0–10.5)

## 2010-07-07 LAB — DIFFERENTIAL
Basophils Absolute: 0 10*3/uL (ref 0.0–0.1)
Eosinophils Absolute: 0.1 10*3/uL (ref 0.0–0.7)
Eosinophils Relative: 1 % (ref 0–5)
Lymphocytes Relative: 34 % (ref 12–46)
Lymphs Abs: 3 10*3/uL (ref 0.7–4.0)
Neutrophils Relative %: 56 % (ref 43–77)

## 2010-07-30 ENCOUNTER — Emergency Department (HOSPITAL_COMMUNITY)
Admission: EM | Admit: 2010-07-30 | Discharge: 2010-07-30 | Disposition: A | Discharge status: Home / Self Care | Payer: BC Managed Care – HMO | Attending: Emergency Medicine | Admitting: Emergency Medicine

## 2010-07-30 DIAGNOSIS — R109 Unspecified abdominal pain: Secondary | ICD-10-CM | POA: Insufficient documentation

## 2010-07-30 DIAGNOSIS — F209 Schizophrenia, unspecified: Secondary | ICD-10-CM | POA: Insufficient documentation

## 2010-07-30 DIAGNOSIS — I10 Essential (primary) hypertension: Secondary | ICD-10-CM | POA: Insufficient documentation

## 2010-07-30 LAB — DIFFERENTIAL
Basophils Relative: 0 % (ref 0–1)
Eosinophils Absolute: 0.1 10*3/uL (ref 0.0–0.7)
Eosinophils Relative: 1 % (ref 0–5)
Monocytes Absolute: 0.7 10*3/uL (ref 0.1–1.0)
Monocytes Relative: 5 % (ref 3–12)

## 2010-07-30 LAB — CULTURE, BLOOD (ROUTINE X 2): Culture: NO GROWTH

## 2010-07-30 LAB — COMPREHENSIVE METABOLIC PANEL
ALT: 27 U/L (ref 0–53)
Albumin: 4.1 g/dL (ref 3.5–5.2)
Alkaline Phosphatase: 77 U/L (ref 39–117)
Chloride: 106 mEq/L (ref 96–112)
Potassium: 4.7 mEq/L (ref 3.5–5.1)
Sodium: 140 mEq/L (ref 135–145)
Total Bilirubin: 0.6 mg/dL (ref 0.3–1.2)
Total Protein: 8.2 g/dL (ref 6.0–8.3)

## 2010-07-30 LAB — CBC
HCT: 39.3 % (ref 39.0–52.0)
Hemoglobin: 16 g/dL (ref 13.0–17.0)
MCV: 88.8 fL (ref 78.0–100.0)
Platelets: 287 10*3/uL (ref 150–400)
Platelets: 352 10*3/uL (ref 150–400)
RBC: 4.43 MIL/uL (ref 4.22–5.81)
RDW: 14.8 % (ref 11.5–15.5)
WBC: 10.6 10*3/uL — ABNORMAL HIGH (ref 4.0–10.5)
WBC: 12.6 10*3/uL — ABNORMAL HIGH (ref 4.0–10.5)

## 2010-07-30 LAB — BASIC METABOLIC PANEL
Chloride: 108 mEq/L (ref 96–112)
Creatinine, Ser: 1.19 mg/dL (ref 0.4–1.5)
GFR calc Af Amer: >60 mL/min (ref >60)
GFR calc non Af Amer: >60 mL/min (ref >60)
Potassium: 4.3 mEq/L (ref 3.5–5.1)

## 2010-07-30 LAB — URINE CULTURE

## 2010-07-30 LAB — AMYLASE
Amylase: 216 U/L — ABNORMAL HIGH (ref 27–131)
Amylase: 84 U/L (ref 27–131)

## 2010-07-30 LAB — LIPASE, BLOOD
Lipase: 27 U/L (ref 11–59)
Lipase: 35 U/L (ref 11–59)

## 2010-08-04 LAB — COMPREHENSIVE METABOLIC PANEL
ALT: 17 U/L (ref 0–53)
Alkaline Phosphatase: 80 U/L (ref 39–117)
BUN: 6 mg/dL (ref 6–23)
CO2: 27 mEq/L (ref 19–32)
GFR calc non Af Amer: 56 mL/min — ABNORMAL LOW (ref >60)
Glucose, Bld: 92 mg/dL (ref 70–99)
Potassium: 4.7 mEq/L (ref 3.5–5.1)
Sodium: 139 mEq/L (ref 135–145)
Total Bilirubin: 0.5 mg/dL (ref 0.3–1.2)
Total Protein: 7.1 g/dL (ref 6.0–8.3)

## 2010-08-04 LAB — DIFFERENTIAL
Basophils Absolute: 0.1 10*3/uL (ref 0.0–0.1)
Basophils Relative: 1 % (ref 0–1)
Eosinophils Absolute: 0.1 10*3/uL (ref 0.0–0.7)
Monocytes Relative: 7 % (ref 3–12)
Neutro Abs: 4.6 10*3/uL (ref 1.7–7.7)
Neutrophils Relative %: 57 % (ref 43–77)

## 2010-08-04 LAB — CBC
HCT: 46.3 % (ref 39.0–52.0)
Hemoglobin: 15.7 g/dL (ref 13.0–17.0)
RBC: 5.17 MIL/uL (ref 4.22–5.81)
RDW: 15 % (ref 11.5–15.5)

## 2010-08-04 LAB — PROTIME-INR
INR: 1 (ref 0.00–1.49)
Prothrombin Time: 13 seconds (ref 11.6–15.2)

## 2010-08-10 ENCOUNTER — Other Ambulatory Visit (INDEPENDENT_AMBULATORY_CARE_PROVIDER_SITE_OTHER): Payer: BC Managed Care – HMO

## 2010-08-10 ENCOUNTER — Encounter: Payer: Self-pay | Admitting: Internal Medicine

## 2010-08-10 ENCOUNTER — Other Ambulatory Visit (INDEPENDENT_AMBULATORY_CARE_PROVIDER_SITE_OTHER): Payer: BC Managed Care – HMO | Admitting: Internal Medicine

## 2010-08-10 ENCOUNTER — Ambulatory Visit (INDEPENDENT_AMBULATORY_CARE_PROVIDER_SITE_OTHER): Payer: BC Managed Care – HMO | Admitting: Internal Medicine

## 2010-08-10 DIAGNOSIS — E785 Hyperlipidemia, unspecified: Secondary | ICD-10-CM

## 2010-08-10 DIAGNOSIS — L723 Sebaceous cyst: Secondary | ICD-10-CM

## 2010-08-10 DIAGNOSIS — E0590 Thyrotoxicosis, unspecified without thyrotoxic crisis or storm: Secondary | ICD-10-CM

## 2010-08-10 DIAGNOSIS — Z862 Personal history of diseases of the blood and blood-forming organs and certain disorders involving the immune mechanism: Secondary | ICD-10-CM

## 2010-08-10 DIAGNOSIS — I10 Essential (primary) hypertension: Secondary | ICD-10-CM

## 2010-08-10 DIAGNOSIS — Z8639 Personal history of other endocrine, nutritional and metabolic disease: Secondary | ICD-10-CM

## 2010-08-10 HISTORY — DX: Sebaceous cyst: L72.3

## 2010-08-10 LAB — COMPREHENSIVE METABOLIC PANEL
ALT: 15 U/L (ref 0–53)
Alkaline Phosphatase: 75 U/L (ref 39–117)
CO2: 35 mEq/L — ABNORMAL HIGH (ref 19–32)
Creatinine, Ser: 1.2 mg/dL (ref 0.4–1.5)
GFR: 82.91 mL/min (ref >60.00)
Total Bilirubin: 0.6 mg/dL (ref 0.3–1.2)

## 2010-08-10 LAB — HEMOGLOBIN A1C: Hgb A1c MFr Bld: 5.7 % (ref 4.6–6.5)

## 2010-08-10 LAB — CBC WITH DIFFERENTIAL/PLATELET
Basophils Relative: 0.5 % (ref 0.0–3.0)
Eosinophils Absolute: 0.1 10*3/uL (ref 0.0–0.7)
Eosinophils Relative: 1 % (ref 0.0–5.0)
Lymphocytes Relative: 24.1 % (ref 12.0–46.0)
Monocytes Relative: 6.6 % (ref 3.0–12.0)
Neutrophils Relative %: 67.8 % (ref 43.0–77.0)
RBC: 5.15 Mil/uL (ref 4.22–5.81)
WBC: 11.8 10*3/uL — ABNORMAL HIGH (ref 4.5–10.5)

## 2010-08-10 LAB — LIPID PANEL
HDL: 45 mg/dL (ref >39.00)
Total CHOL/HDL Ratio: 6
Triglycerides: 217 mg/dL — ABNORMAL HIGH (ref 0.0–149.0)
VLDL: 43.4 mg/dL — ABNORMAL HIGH (ref 0.0–40.0)

## 2010-08-10 MED ORDER — AMLODIPINE-OLMESARTAN 5-20 MG PO TABS: 1.0000 | ORAL_TABLET | Freq: Every day | ORAL | Status: DC

## 2010-08-10 NOTE — Progress Notes (Signed)
Subjective:    Patient ID: [name], male    DOB: [birth year], 55 y.o.   MRN: [id number]  Hypertension This is a chronic problem. The current episode started more than 1 year ago. The problem has been gradually worsening since onset. The problem is uncontrolled. Associated symptoms include headaches. Pertinent negatives include no anxiety, blurred vision, chest pain, malaise/fatigue, neck pain, orthopnea, palpitations, peripheral edema, PND, shortness of breath or sweats. There are no associated agents to hypertension. Past treatments include beta blockers. The current treatment provides mild improvement. There are no compliance problems.  There is no history of angina or heart failure.  Headache  Pertinent negatives include no abdominal pain, back pain, blurred vision, coughing, dizziness, fever, nausea, neck pain, numbness, seizures or weakness. His past medical history is significant for hypertension.      Review of Systems  Constitutional: Negative for fever, chills, malaise/fatigue, diaphoresis, activity change, appetite change, fatigue and unexpected weight change.  HENT: Negative for facial swelling, neck pain and neck stiffness.   Eyes: Negative for blurred vision.  Respiratory: Negative for apnea, cough, choking, chest tightness, shortness of breath, wheezing and stridor.   Cardiovascular: Negative for chest pain, palpitations, orthopnea, leg swelling and PND.  Gastrointestinal: Negative for nausea, abdominal pain, diarrhea, constipation, blood in stool, abdominal distention and anal bleeding.  Genitourinary: Positive for scrotal swelling. Negative for dysuria, urgency, frequency, hematuria, flank pain, decreased urine volume, discharge, penile swelling, enuresis, difficulty urinating, genital sores, penile pain and testicular pain.  Musculoskeletal: Negative for myalgias, back pain, joint swelling, arthralgias and gait problem.  Skin: Positive for wound (he has had cysts since he  was a child and today complains of multiple lesions on his scrotum and left ear lobe, he had one removeed from his right scalp by gen surgery). Negative for color change, pallor and rash.  Neurological: Positive for headaches. Negative for dizziness, tremors, seizures, syncope, facial asymmetry, speech difficulty, weakness, light-headedness and numbness.  Hematological: Negative for adenopathy. Does not bruise/bleed easily.  Psychiatric/Behavioral: Negative for suicidal ideas, behavioral problems, confusion, sleep disturbance, self-injury, dysphoric mood, decreased concentration and agitation. The patient is not nervous/anxious.        Objective:   Physical Exam  Vitals reviewed. Constitutional: He is oriented to person, place, and time. He appears well-developed and well-nourished. No distress.  HENT:  Head: Normocephalic and atraumatic.  Right Ear: External ear normal.  Left Ear: External ear normal.  Nose: Nose normal.  Mouth/Throat: Oropharynx is clear and moist. No oropharyngeal exudate.  Eyes: Conjunctivae and EOM are normal. Pupils are equal, round, and reactive to light. Right eye exhibits no discharge. Left eye exhibits no discharge. No scleral icterus.  Neck: Normal range of motion. Neck supple. No JVD present. No tracheal deviation present. No thyromegaly present.  Cardiovascular: Normal rate, regular rhythm, normal heart sounds and intact distal pulses.  Exam reveals no gallop and no friction rub.   No murmur heard. Pulmonary/Chest: Effort normal and breath sounds normal. No respiratory distress. He has no wheezes. He has no rales. He exhibits no tenderness.  Abdominal: Soft. Bowel sounds are normal. He exhibits no distension and no mass. There is no tenderness. There is no rebound and no guarding. Hernia confirmed negative in the right inguinal area and confirmed negative in the left inguinal area.  Genitourinary: Testes normal and penis normal. Right testis shows no mass, no  swelling and no tenderness. Left testis shows no mass, no swelling and no tenderness. Uncircumcised. No phimosis, paraphimosis, hypospadias, penile  erythema or penile tenderness. No discharge found.  Musculoskeletal: Normal range of motion. He exhibits no edema and no tenderness.  Lymphadenopathy:    He has no cervical adenopathy.       Right: No inguinal adenopathy present.       Left: No inguinal adenopathy present.  Neurological: He is alert and oriented to person, place, and time. He has normal reflexes. No cranial nerve deficit. He exhibits normal muscle tone. Coordination normal.  Skin: Skin is warm and dry. No rash noted. He is not diaphoretic. No erythema. No pallor.       He has a sebaceous cyst on his left ear lobe that measures about 1 cm, it is not tender, is not fluctuant, and there is no erythema or exudate. Over the scrotum he has many sebaceous cysts that are about .5-1 cm are freely mobile and have no ttp, erythema, exudate, warmth, or exudate.  Psychiatric: He has a normal mood and affect. His behavior is normal. Judgment and thought content normal.         Lab Results  Component Value Date   WBC 9.7 02/27/2010   HGB 15.7 02/27/2010   HCT 45.5 02/27/2010   PLT 344.0 02/27/2010   CHOL 191 08/08/2006   TRIG 177* 08/08/2006   HDL 33.8* 08/08/2006   ALT 26 02/27/2010   AST 21 02/27/2010   NA 137 02/27/2010   K 4.1 02/27/2010   CL 99 02/27/2010   CREATININE 1.3 02/27/2010   BUN 7 02/27/2010   CO2 31 02/27/2010   TSH 1.47 02/27/2010   PSA 2.22 02/27/2010   INR 1.0 06/10/2008   HGBA1C 5.7 11/14/2009   Assessment & Plan:

## 2010-08-10 NOTE — Patient Instructions (Signed)
Hypertension (High Blood Pressure) As your heart beats, it forces blood through your arteries. This force is your blood pressure. If the pressure is too high, it is called hypertension (HTN) or high blood pressure. HTN is dangerous because you may have it and not know it. High blood pressure may mean that your heart has to work harder to pump blood. Your arteries may be narrow or stiff. The extra work puts you at risk for heart disease, stroke, and other problems.  Blood pressure consists of two numbers, a higher number over a lower, 110/72, for example. It is stated as "110 over 72." The ideal is below 120 for the top number (systolic) and under 80 for the bottom (diastolic). Write down your blood pressure today. You should pay close attention to your blood pressure if you have certain conditions such as:  Heart failure.  Prior heart attack.   Diabetes   Chronic kidney disease.   Prior stroke.   Multiple risk factors for heart disease.   To see if you have HTN, your blood pressure should be measured while you are seated with your arm held at the level of the heart. It should be measured at least twice. A one-time elevated blood pressure reading (especially in the Emergency Department) does not mean that you need treatment. There may be conditions in which the blood pressure is different between your right and left arms. It is important to see your caregiver soon for a recheck. Most people have essential hypertension which means that there is not a specific cause. This type of high blood pressure may be lowered by changing lifestyle factors such as:  Stress.  Smoking.   Lack of exercise.   Excessive weight.  Drug/tobacco/alcohol use.   Eating less salt.   Most people do not have symptoms from high blood pressure until it has caused damage to the body. Effective treatment can often prevent, delay or reduce that damage. TREATMENT Treatment for high blood pressure, when a cause has been  identified, is directed at the cause. There are a large number of medications to treat HTN. These fall into several categories, and your caregiver will help you select the medicines that are best for you. Medications may have side effects. You should review side effects with your caregiver. If your blood pressure stays high after you have made lifestyle changes or started on medicines,   Your medication(s) may need to be changed.   Other problems may need to be addressed.   Be certain you understand your prescriptions, and know how and when to take your medicine.   Be sure to follow up with your caregiver within the time frame advised (usually within two weeks) to have your blood pressure rechecked and to review your medications.   If you are taking more than one medicine to lower your blood pressure, make sure you know how and at what times they should be taken. Taking two medicines at the same time can result in blood pressure that is too low.  SEEK IMMEDIATE MEDICAL CARE IF YOU DEVELOP:  A severe headache, blurred or changing vision, or confusion.   Unusual weakness or numbness, or a faint feeling.   Severe chest or abdominal pain, vomiting, or breathing problems.  MAKE SURE YOU:   Understand these instructions.   Will watch your condition.   Will get help right away if you are not doing well or get worse.  Document Released: 04/05/2005 Document Re-Released: 09/23/2009 [employer] Patient Information 2011 [employer].Sebaceous Cyst Sebaceous cysts are sometimes called epidermal cysts. They may be caused by an injury to the skin or an infected hair follicle. The cyst usually contains a "pasty" or "cheesy" looking substance which may have a bad smell. Sebaceous cysts are usually painless, slow-growing small bumps or lumps that move freely under the skin. It's important not to try and pop them, as this may cause an infection and lead to tenderness and swelling.  Signs or symptoms that  may indicate infection of sebaceous cysts include:   Redness.   Tenderness.   Increased temperature of the skin over the bumps or lumps.   Grayish white, cheesy, foul smelling material draining from the bump or lump.  TREATMENT  Sebaceous cysts often get better and disappear on their own.   If sebaceous cysts become infected, they may need to be drained, which would require making a small incision into the cyst. Treatment with antibiotics (medications which kill germs) may also be necessary.   Applying warm compresses to the cyst 2-3 times daily may help with discomfort and may also help the cyst drain.   Small inflamed cysts can often be treated by injecting steroid medications   Sometimes sebaceous cysts become large and bothersome. If this happens, surgical removal in your caregiver's office may be necessary.  Seek Medical Care If:  You develop any signs of infection.   Your condition is not improving, or is getting worse.   You have any other questions or concerns.  Document Released: 05/13/2004 Document Re-Released: 07/02/2008 [facility] Patient Information 2011 [place], [place].

## 2010-08-11 ENCOUNTER — Encounter: Payer: Self-pay | Admitting: Internal Medicine

## 2010-08-11 ENCOUNTER — Telehealth: Payer: Self-pay | Admitting: Internal Medicine

## 2010-08-11 DIAGNOSIS — E0590 Thyrotoxicosis, unspecified without thyrotoxic crisis or storm: Secondary | ICD-10-CM | POA: Insufficient documentation

## 2010-08-11 HISTORY — DX: Thyrotoxicosis, unspecified without thyrotoxic crisis or storm: E05.90

## 2010-08-11 NOTE — Assessment & Plan Note (Signed)
I gave him reassurance about the cysts, he was advised of s/s of infection and let me know if any symptoms occur. I did not recommend that any of these cysts be removed due to the innocuous nature of sebaceous cysts.

## 2010-08-11 NOTE — Telephone Encounter (Signed)
He was notified of the abnormal thyroid test

## 2010-08-11 NOTE — Assessment & Plan Note (Addendum)
His TSH is very low today, I will ask him to return asap for further testing. He was informed of these results and tells me that he will return this week for more thyroid testing.

## 2010-08-11 NOTE — Assessment & Plan Note (Signed)
His BP is not well controlled, labs ordered to look for secondary causes, I will add Azor to his carvedilol.

## 2010-08-11 NOTE — Assessment & Plan Note (Signed)
FLP and CMP were done today

## 2010-08-11 NOTE — Assessment & Plan Note (Signed)
Check his A1C today

## 2010-08-14 ENCOUNTER — Encounter: Payer: Self-pay | Admitting: Internal Medicine

## 2010-08-14 ENCOUNTER — Ambulatory Visit (INDEPENDENT_AMBULATORY_CARE_PROVIDER_SITE_OTHER): Payer: BC Managed Care – HMO | Admitting: Internal Medicine

## 2010-08-14 VITALS — BP 142/98 | HR 78 | Temp 98.7°F | Wt 258.0 lb

## 2010-08-14 DIAGNOSIS — E785 Hyperlipidemia, unspecified: Secondary | ICD-10-CM

## 2010-08-14 NOTE — Progress Notes (Signed)
  Subjective:    Patient ID: [name], male    DOB: 09/18/1955, 55 y.o.   MRN: [id number]  HPI    Review of Systems     Objective:   Physical Exam        Assessment & Plan:

## 2010-08-14 NOTE — Patient Instructions (Signed)
Thyroid function is normal. No need for further testing. Cholesterol is very high: LDL (bad) cholesterol is 184 and should be less than 130. Plan - follow a low fat diet. Start vytorin 10/20 one tablet a day. 28 tablets as samples. Will need to come back in 3 weeks for follow-up cholesterol test.  Cholesterol Cholesterol is a white, waxy, fat-like protein needed by your body in small amounts. The liver makes all the cholesterol you need. It is carried from the liver by the blood through the blood vessels. Deposits (plaque) may build up on blood vessel walls. This makes the arteries narrower and stiffer. Plaque increases the risk for heart attack and stroke. You cannot feel your cholesterol level even if it is very high. The only way to know is by a blood test to check your lipid (fats) levels. Once you know your cholesterol levels, you should keep a record of the test results. Work with your caregiver to to keep your levels in the desired range. WHAT THE RESULTS MEAN:  Total cholesterol is a rough measure of all the cholesterol in your blood.   LDL is the so-called bad cholesterol. This is the type that deposits cholesterol   in the walls of the arteries. You want this level to be low.   HDL is the good cholesterol because it cleans the arteries and carries the LDL away. You want this level to be high.   Triglycerides are fat that the body can either burn for energy or store. High levels are closely linked to heart disease.  DESIRED LEVELS:  Total cholesterol below 200.   LDL below 100 for people at risk, below 70 for very high risk.   HDL above 50 is good, above 60 is best.   Triglycerides below 150.  HOW TO LOWER YOUR CHOLESTEROL:  Diet.   Choose fish or white meat chicken and [place], roasted or baked. Limit fatty cuts of red meat, fried foods, and processed meats, such as sausage and lunch meat.   Eat lots of fresh fruits and vegetables. Choose whole grains, beans, pasta, potatoes  and cereals.   Use only small amounts of olive, corn or canola oils. Avoid butter, mayonnaise, shortening or palm kernel oils. Avoid foods with trans-fats.   Use skim/nonfat milk and low-fat/nonfat yogurt and cheeses. Avoid whole milk, cream, ice cream, egg yolks and cheeses. Healthy desserts include angel food cake, gingersnaps, animal crackers, hard candy, popsicles, and low-fat/nonfat frozen yogurt. Avoid pastries, cakes, pies and cookies.   Exercise.   A regular program helps decrease LDL and raises HDL.   Helps with weight control.   Do things that increase your activity level like gardening, walking, or taking the stairs.   Medication.   May be prescribed by your caregiver to help lowering cholesterol and the risk for heart disease.   You may need medicine even if your levels are normal if you have several risk factors.  HOME CARE INSTRUCTIONS  Follow your diet and exercise programs as suggested by your caregiver.   Take medications as directed.   Have blood work done when your caregiver feels it is necessary.  MAKE SURE YOU:    Understand these instructions.   Will watch your condition.   Will get help right away if you are not doing well or get worse.  Document Released: 12/29/2000 Document Re-Released: 03/18/2008 [facility] Patient Information 2011 [place], [place].Cholesterol Control Modern scientists have known for a long time that cholesterol levels in the body are related  to coronary heart disease and that diet affects these levels. The following material helps to explain this relationship and discusses what you can do to help keep your heart healthy. Not all cholesterol is bad. Low-density lipoprotein (LDL) cholesterol is the "bad" cholesterol which may cause fatty deposits to build up inside your arteries. High-density lipoprotein (HDL) cholesterol is "good". It helps to remove the "bad" LDL cholesterol from your blood. Cholesterol is a very important risk factor for  coronary heart disease. Other risk factors are high blood pressure, smoking, stress, heredity, and weight. The heart muscle gets its supply of blood through the coronary arteries. If your LDL ("bad") cholesterol is high and your HDL ("good") cholesterol is low, you have a risk factor for fatty deposits accumulating in your coronary arteries (a vessel providing blood to the heart). This leaves less room through which blood can flow. Without sufficient blood and the oxygen, the heart muscle cannot function properly, and you may feel chest pains (called angina pectoris). When a coronary artery closes up entirely, a part of the heart muscle may die (called a myocardial infarction). CHECKING CHOLESTEROL When your caregiver sends your blood to a lab to be analyzed for cholesterol, they may do a complete lipid (fat) profile. With this test the total amount of cholesterol, as well as levels of LDL and HDL, are determined. The chart below describes what the numbers should be: Test  Goal   Total Cholesterol  Less than 200 mg/dl   LDL "bad cholesterol"  Less than 160 mg/dl for those at low risk for heart disease  Less than 130 mg/dl for those at intermediate risk for heart disease. Less than 100 mg/dl for those with diabetes or at high risk for heart disease Less than 70 mg/dl for those at very high risk for heart disease   HDL "good cholesterol"  Women: Greater than 50 mg/dl  Men: Greater than 40 mg/dl   Triglycerides  Less than 150 mg/dl   Reference: [email or web address] 05/03/06 CONTROLLING CHOLESTEROL WITH DIET Although such factors as exercise and life-style are important, the "first line of attack" is diet. That is because certain foods are known to raise cholesterol and others to lower it. So the goal for most Americans is to balance foods for their effect on cholesterol and, even more important, to replace saturated fat with other types of fat, such as monounsaturated and polyunsaturated  fats, and omega-3 fatty acids. The average American takes in about 500 to 600 milligrams (mg) of cholesterol and about 40 grams (g) of saturated fat daily. Ideally, these figures should be reduced to 300 mg of cholesterol and 15 to 17 g of saturated fat, or even lower in people who have coronary artery disease or a history of heart attack. But that does not mean you have to sacrifice all your favorite foods. Today, as the table at the end of this document shows, there are good-tasting, low-fat, low-cholesterol substitutes for most of the things you like to eat. Which foods should you choose? Choose low-fat or non-fat alternatives. Choose round or loin cuts of red meat as these types of cuts are lowest in fat and cholesterol. Chicken (without the skin), fish, veal and ground [place] breast are excellent choices. Eliminate fatty meats like hotdogs and salami. Even shellfish have little or no saturated fat and so, despite their high cholesterol content, are allowable in moderation. When you eat lean meat, poultry, or fish, have a 3 oz. portion. For baking and cooking, oils are an  excellent substitute for butter. The monounsaturated oils are of particular benefit since it is believed they lower LDL (the bad cholesterol) but raise HDL. The oils you should avoid entirely are the saturated tropical oils such as coconut and palm.   Remember to eat liberally from food groups that are naturally free of cholesterol and saturated fat, including fish, fruit, vegetables, beans, grains (barley, rice, couscous, bul-gur wheat), and pasta (without cream sauces).   IDENTIFYING FOODS THAT LOWER CHOLESTEROL . . . Soluble fiber found in fruits such as apples; vegetables such as broccoli, potatoes, and carrots; legumes such as beans, peas, and lentils; and grains such as barley may lower your cholesterol. Foods fortified with plant sterols, or phytosterol, may also lower cholesterol. You should eat at least 2g per day of these foods  for a cholesterol lowering effect.   How can you identify low-cholesterol, low-fat foods at the supermarket? The key is to read package labels. Select cheeses that have only 2-3 g saturated fat per ounce. Use a heart healthy tub margarine free of partially hydrogenated oil such as Insurance claims handler. When buying baked goods (cookies, crackers), avoid partially hydrogenated oils. Breads and muffins should be made from whole grains (whole wheat or whole oat flour, instead of just "flour" or "enriched flour"). Buy non-creamy canned soups with reduced salt and no added fats.   FOOD PREPARATION TECHNIQUES . . . Never deep fry. If you must fry, either stir fry, which uses very little fat, or use the non-stick cooking sprays like Pam. When possible, broil, bake, or roast meats, and steam vegetables. Instead of dressing vegetables with butter or margarine, use lemon and herbs, applesauce and cinnamon (for squash and sweet potatoes), non-fat yogurt, salsa, and low-fat dressings for salads.   See the following table for food, cholesterol, and fat information. FOODS HIGH IN CHOLESTEROL AND/OR SATURATED FAT  LOW CHOLESTEROL/LOW-FAT SUBSTITUTES     Cholesterol  (milligrams)  Saturated Fat  (grams)    Cholesterol  (milligrams)  Saturated Fat  (grams)   Steak, marbled (3 oz)  90  11  Steak, lean (3 oz)  50  4   Hamburger (3 oz)  80  7  Hamburger, lean (3 oz)  50  5   Ham (3 oz)  53  6  Ham, lean cut (3 oz)  35  2.4   Chicken, with skin  (3 oz)      Chicken, skin removed, 3 oz       Dark meat  90  4  Dark meat  80  2   Light meat  80  2.5  Light meat  70  1   Egg (1 Large)  200-300  1.7  Egg substitutes ("Eggbeaters")  0  0   Whole Milk (1 cup)  33  5  Low-fat milk (2%)  (1 cup)  18  3         Low-fat milk (1%)  (1 cup)  10  1.5         Skim milk (1 cup)  trace  0.3   Hard Cheese (1 oz)  30  6  Skim milk cheese  (1 oz)  16  2-3   Cottage Cheese    (1 cup) (4% fat)  35  6.5  Low-fat cottage cheese  (1 cup) (1% fat)  10  1.5   Ice cream (1 cup)  60  9  Sherbet (1 cup)  14  2.5  Non-fat frozen yogurt (1 cup)  0  0.3         Frozen fruit bars  0  trace   Whipped cream  (1 tbsp)  20  3.5  Non-dairy whipped toppings (1 tbsp)  trace  1   Mayonnaise  (1 tbsp)  5  2  Low-fat mayonnaise (1 tbsp)  5  1   Butter (1 tbsp)  30  7  Extra light margarines ( tbsp)  0  1   Oils (1 tbsp)      Oils (1 tbsp)       Saturated      Monounsaturated       Coconut oil  0  11.8  Olive  0  1.8         Polyunsaturated             Corn  0  1.7         Safflower  0  1.2         Sunflower  0  1.4         Soybean  0  2.4   Document Released: 04/05/2005 Document Re-Released: 01/31/2007 [facility] Patient Information 2011 [place], [place].

## 2010-08-14 NOTE — Progress Notes (Signed)
  Subjective:    Patient ID: [name], male    DOB: 07-02-1955, 55 y.o.   MRN: [id number]  HPI Mr. Schappert was seen by Dr. Yetta Barre recently: his BP medications were changed to azor - the patient says he feels great. BP is doing fairly well.   Multiple labs drawn: TSH 1.57 - normal, Lipid panel with marked LDL elevation - 180+. He had been told to come in to discuss his lab results.    Review of Systems No constitutional problems HEENT- he has had a abscess on the left ear - see Dr. Yetta Barre note - and this has spontaneously drained in the last 24 hrs.  No pain, or fever. Pul - no complaints Cor - no c/p, no palpitations GI- stable Neuro - would like his chronic meds to be more tolerable. He reports that he missed several days of medication and by day 4 noticed increasing psychotic symptoms.    Objective:   Physical Exam Heavy set AA male in no distress Pul- no increased work of breathing Neuro - alert, oriented, speech - thickened (chronic) but intelligible.      Assessment & Plan:  1. Thyroid - patient was concerned about thyroid abnormality. He is reassured that labs indicate normal function.  2. Hyperlipidemia - LDL in clear treatment range (NCEP-ATP III). He feels that his diet is optimized.  Plan- vytorin 10/20 qd - 28 days samples           F/u lab 3 weeks

## 2010-09-01 NOTE — H&P (Signed)
NAMEMARON, STANZIONE              ACCOUNT NO.:  [id number]   MEDICAL RECORD NO.:  [id number]          PATIENT TYPE:  INP   LOCATION:  5123                         FACILITY:  [facility]   PHYSICIAN:  Sharlet Salina T. [name], M.D.DATE OF BIRTH:  [birth year]   DATE OF ADMISSION:  06/25/2008  DATE OF DISCHARGE:                              HISTORY & PHYSICAL   PRIMARY SURGEON:  Adolph Pollack, MD   PRIMARY CARE PHYSICIAN:  Rosalyn Gess. Norins, MD   CHIEF COMPLAINT:  Intractable nausea and vomiting.   HISTORY OF PRESENT ILLNESS:  [name] is a 55 year old male patient  with history of hypertension and well-controlled schizophrenia on  medications.  He had undergone laparoscopic cholecystectomy  uncomplicated by Dr. Abbey Chatters on June 13, 2008.  Apparently the  patient had biliary pancreatitis and also has a prior history of  idiopathic pancreatitis.  The patient reports he was doing well  postoperatively until early this morning when he developed abrupt onset  of nausea and vomiting.  Once he began having nausea and vomiting, he  started having some abdominal pain and an old ventral hernia.  He has  not been able to keep anything down including his antihypertensive  medications.  He does not report any diarrhea or bloody emesis.  He was  initially going to be seen at the [facility] at our office, but  because of intractable nausea and vomiting, his family decided to bring  him to the emergency department.  We were notified of the patient's  arrival prior to him being evaluated by the EDP and we have seen him  directly.   REVIEW OF SYSTEMS:  As per the history of present illness.  The patient  is having difficulty talking to me because of severe nausea and  recurrent emesis while in the room.  Again, there is no blood in his  emesis.  He has not had any diarrhea.  He thinks he has passed flatus  today.  He has not had a bowel movement since yesterday.   PAST MEDICAL HISTORY:  1.  Depression and anxiety.  2. Schizophrenia.  3. History of gallbladder as well as idiopathic pancreatitis.  4. Hypertension.  5. Prior gastritis.  6. GI bleed.   PAST SURGICAL HISTORY:  1. Laparoscopic cholecystectomy on June 13, 2008.  2. Exploratory laparotomy in 1988.  3. Excision of thyroid mass.  4. Repair of ventral hernia with mesh.   FAMILY HISTORY:  Noncontributory.   SOCIAL HISTORY:  The patient is married.  No current alcohol or tobacco.   DRUG ALLERGIES:  He is intolerant to CELEBREX, IV DYE, and REGLAN.   MEDICATIONS:  Generic Navane, unknown antihypertensive meds, Tylenol,  and Protonix.   PHYSICAL EXAMINATION:  GENERAL:  An acutely ill male patient, who is  currently having repeated emesis during my exam.  VITAL SIGNS:  Temperature is 99.2, BP is 203/139, but again the patient  is having repeated emesis, pulse is 85, respirations 20.  PSYCH:  The patient is alert and oriented x3.  He is appropriate to  current situation.  NEUROLOGIC:  Cranial nerves II  through XII are grossly intact.  The  patient is moving all extremities x4.  DTRs, gait, and ambulation were  not assessed.  The patient was ambulatory to the treatment room #7 in  the ER.  HEENT:  Eyes, sclerae are injected, nonicteric.  Pupils are equal and  reactive to light.  Ears, nose, and throat:  Ears are symmetrical.  No  otorrhea.  Nose is midline.  No rhinorrhea.  Oral mucous membranes are  pink and dry.  CHEST:  Bilateral lung sounds are clear to auscultation posteriorly.  The patient is somewhat tachypneic.  Pulse ox reading in the triage area  with 100% on room air.  CARDIOVASCULAR:  Heart sounds are S1 and S2 without obvious rubs,  murmurs, or gallops.  He is not tachycardiac.  No peripheral edema.  ABDOMEN:  Obese but soft, mildly tender over ventral hernia wall defect  without any protrusion of hernia.  Bowel sounds are present, but they  are diminished.  Again, the patient is experiencing  active bilious  emesis while I am in the room.  He is also somewhat tender over his  trocar site incisions.  He was not tender in the epigastric or right  upper quadrant regions.  EXTREMITIES:  Symmetrical in appearance without cyanosis or clubbing.  SKIN:  Cool and moist to the touch.   Labs and x-rays are all pending after my exam.   IMPRESSION:  Intractable nausea and vomiting in patient post recent  laparoscopic cholecystectomy.  Differential includes acute viral  gastroenteritis, small bowel obstruction, bile leak or gallbladder fossa  abscess, recurrent pancreatitis or incarcerated ventral hernia.   PLAN:  1. Admit the patient to general surgical floor.  2. N.p.o. status, IV fluid hydration at 115 hour.  3. Treat symptoms with antiemetics, anxiolytics, and pain medication.  4. Obtain plain abdominal x-rays to rule out bowel obstruction 2      views.  5. Oral contrast CT of the abdomen if the patient is not obstructed      and can hold on oral contrast.  He has IV contrast allergy and at      this time we will not premedicate.  6. IV antihypertensives.      [name], N.P.      [name], M.D.  Electronically Signed    ALE/MEDQ  D:  06/25/2008  T:  06/26/2008  Job:  [id number]   cc:   Rosalyn Gess. Norins, MD  Adolph Pollack, M.D.

## 2010-09-01 NOTE — Op Note (Signed)
[name], [name]              ACCOUNT NO.:  [id number]   MEDICAL RECORD NO.:  [id number]          PATIENT TYPE:  OIB   LOCATION:  5125                         FACILITY:  [facility]   PHYSICIAN:  Adolph Pollack, M.D.DATE OF BIRTH:  1955/11/07   DATE OF PROCEDURE:  DATE OF DISCHARGE:  06/13/2008                               OPERATIVE REPORT   PREOPERATIVE DIAGNOSES:  Cholelithiasis, recurrent pancreatitis, biliary  dyskinesia.   POSTOPERATIVE DIAGNOSES:  Cholelithiasis, recurrent pancreatitis,  biliary dyskinesia.   PROCEDURE:  Laparoscopic cholecystectomy with intraoperative  cholangiogram.   SURGEON:  Adolph Pollack, MD   ASSISTANT:  Troy Sine. Dwain Sarna, MD   ANESTHESIA:  General.   INDICATION:  [name] is a 55 year old male who has a history of  idiopathetic pancreatitis and chronic upper abdominal pain for many  years.  He has not been able to get an abdominal ultrasound until  recently when it demonstrated cholelithiasis.  Multiple gallstones were  noted.  Nuclear medicine biliary scan demonstrated a depressed ejection  fraction.  He now presents for elective cholecystectomy.  Of note is  that he has had a previous exploratory laparotomy and repair of a  ventral incisional hernia.   TECHNIQUE:  He was brought to the operating room, placed supine on the  operating table, and general anesthetic was administered.  The hair of  the abdominal wall was clipped, and the abdominal wall sterilely prepped  and draped.  In the right mid lateral position, I injected Marcaine  solution superficially and deep.  I then made an incision through the  skin and subcutaneous tissue identifying the anterior fascia.  I made an  incision in the anterior and posterior fascia and the peritoneal cavity.  The right side of the abdomen was relatively free of adhesions.  A  Hasson trocar was then reduced in the peritoneal cavity and  pneumoperitoneum created by insufflation of CO2  gas.   Next, a laparoscope was introduced.  I could see omental adhesions to  the abdominal wall and some small bowel adhesions.  There appeared to be  a small ventral hernia superiorly with some omentum in it.   Lateral to the umbilicus under direct vision I was able to place an 11-  mm trocar.  An 11-mm trocar was also placed in the lateral epigastric  region to the right of the midline and one 5-mm trocar placed in the  right upper quadrant.   The fundus of the gallbladder was grasped and no acute inflammatory  changes were noted.  It was retracted toward the right shoulder.  The  infundibulum was then mobilized by dissecting fibrofatty tissue free  from it.  I then identified the cystic duct in an anterior branch of the  cystic artery.  Using careful blunt dissection close to the gallbladder,  I created windows around both of these and obtained a critical view.  A  clip was then placed at the cystic duct-gallbladder junction.  A small  incision was made to the cystic duct.  A cholangiocatheter was passed to  the anterior abdominal wall, placed into the cystic duct,  and a  cholangiogram was performed.   Under real-time fluoroscopy, dilute contrast was injected to the cystic  duct which was of short-to-moderate length.  The common hepatic, right  and left hepatic, common bile ducts all filled promptly.  Contrast  drained promptly to the duodenum without evidence of obstruction.  Final  reports pending the radiologist's interpretation.   The cholangiocath was removed, the cystic duct was clipped 3 times on  the biliary side and divided.  Anterior branch of the cystic artery was  clipped and divided.  Posterior branch was identified, clipped and  divided.  The gallbladder was dissected free of liver using  electrocautery.  Three puncture wounds were made in the gallbladder with  some leakage of bile but no stone leakage.  The gallbladder was then  placed in an Endopouch bag.   I  then copiously irrigated out the abdominal cavity with 1.5 to 2 L of  fluid and evacuated fluid.  I inspected the gallbladder fossa twice and  noted no bleeding or bile leak.  There was no injury to bowel.   Following this, the gallbladder was removed through the right mid  abdominal incision.  Using Endoclose device, I partially closed the  fascia with a 0 Vicryl suture.  I then removed the remaining trocars and  released pneumoperitoneum.  The remaining fascial defect in the right  mid lateral incision was closed anteriorly with a 0 Vicryl suture.  Skin  incisions were then closed with 4-0 Monocryl subcuticular stitches  followed by Steri-Strips and sterile dressings.   He tolerated the procedure without any apparent complications and was  taken to the recovery room in satisfactory condition.      Adolph Pollack, M.D.  Electronically Signed     TJR/MEDQ  D:  06/13/2008  T:  06/13/2008  Job:  [id number]   cc:   Hedwig Morton. Juanda Chance, MD  Rosalyn Gess Norins, MD  Wilhemina Bonito. Marina Goodell, MD

## 2010-09-01 NOTE — Discharge Summary (Signed)
[name], [name]              ACCOUNT NO.:  [id number]   MEDICAL RECORD NO.:  [id number]          PATIENT TYPE:  INP   LOCATION:  5123                         FACILITY:  [facility]   PHYSICIAN:  Sharlet Salina T. Hoxworth, M.D.DATE OF BIRTH:  [birth year]   DATE OF ADMISSION:  06/25/2008  DATE OF DISCHARGE:  06/27/2008                               DISCHARGE SUMMARY   ADMITTING PHYSICIAN:  Sharlet Salina T. Hoxworth, MD   CHIEF COMPLAINT/REASON FOR ADMISSION:  [name] is a 55 year old male  patient, recent laparoscopic cholecystectomy by Dr. Abbey Chatters on  June 13, 2008, who presented to the hospital because of intractable  nausea and vomiting.  He had been unable to keep down liquids nor  medications and he was hypertensive from vomiting and some inability to  take medications.  He was complaining of midline abdominal pain around  his ventral hernia that the ventral hernia itself was soft and non-  incarcerated.  At the time of admission, he was seen before, lab work  had been obtained, so an extensive workup was initiated at the time of  admission and the patient was admitted directly from the ER to the  surgical floor with the following problems.  Intractable nausea and  vomiting in patient post laparoscopic cholecystectomy.  Differential  includes viral gastroenteritis, small bowel obstruction, bile leak  versus gallbladder fossa abscess, recurrent pancreatitis, incarcerated  ventral hernia.   HOSPITAL COURSE:  The patient was admitted and placed on n.p.o. status,  IV fluid hydration at a 150 mL an hour, and labs were ordered.  The  patient underwent plain x-ray as well as CT of the abdomen and pelvis.  The patient was unable to keep down oral contrast due to emesis, so this  was a noncontrast CT.  All x-ray studies were normal, showed no acute  process, and no etiology for the abdominal symptoms.  Subsequent lab  work revealed leukocytosis of 12,600 and mildly elevated hemoglobin at  16 consistent with the presumed diagnosis of dehydration.  Potassium  4.7, creatinine 1.2, and glucose 119.  LFTs were normal.  Amylase was  mildly elevated at 216.  Lipase was 335.   By hospital day #1, the patient's nausea had improved to the point where  he was able to tolerate clear liquids.  No emesis.  He was continued on  IV fluids and labs were followed up the following morning.  At that  time, his white count had improved to 10,600 and hemoglobin after  hydration was 10-13.4.  Electrolyte panel was stable.  Repeat amylase  and lipase showed an amylase of 84 and lipase 27, but normal.  The  patient was tolerating clear liquids and was requesting to go home.  On  exam, his abdomen was benign, nontender and bowel sounds were present.   FINAL DISCHARGE DIAGNOSES:  1. Intractable nausea and vomiting and dehydration secondary to      presumed acute gastroenteritis without evidence of recurrence of      pancreatitis or postoperative complications from laparoscopic      cholecystectomy.  2. Hypertension, controlled.  3. Schizophrenia, controlled on medications.  DISCHARGE MEDICATIONS:  The patient will resume the following home  medications:  1. Thiothixene 2 mg 2 tablets at hour of sleep.  2. Coreg 6.25 mg daily.  3. Lisinopril 20 mg 2 tablets b.i.d.   DISCHARGE INSTRUCTIONS:  The patient is to remain on full liquids and  advanced slowly towards a more regular diet as tolerated, pending  symptoms.   ACTIVITY:  No restrictions.   FOLLOWUP:  The patient is to call Dr. Debby Bud, his primary care  physician, to be seen in the next 2-3 days or early next week to make  sure you are improving from your gastroenteritis symptoms.      Allison L. Rennis Harding, N.P.      Lorne Skeens. Hoxworth, M.D.  Electronically Signed    ALE/MEDQ  D:  06/27/2008  T:  06/27/2008  Job:  [id number]   cc:   Adolph Pollack, M.D.  Rosalyn Gess Norins, MD

## 2010-09-04 NOTE — Discharge Summary (Signed)
[name], [name]              ACCOUNT NO.:  [id number]   MEDICAL RECORD NO.:  [id number]          PATIENT TYPE:  INP   LOCATION:  1525                         FACILITY:  [facility]   PHYSICIAN:  Rosalyn Gess. Norins, MD  DATE OF BIRTH:  Nov 24, 1955   DATE OF ADMISSION:  11/07/2006  DATE OF DISCHARGE:  11/09/2006                               DISCHARGE SUMMARY   The patient left the hospital against medical advise on November 09, 2006.   ADMISSION DIAGNOSES:  1. Abdominal pain with nausea and vomiting.  2. Dysuria with probable prostatitis.   DISCHARGE DIAGNOSIS:  The patient left against medical advise.  There  was no definitive discharge diagnosis.   The patient was to have an EGD on the day of discharge, however, he left  the hospital before the procedure could be done.   CONSULTATIONS:  Dr. Christella Hartigan for GI.   HISTORY OF PRESENT ILLNESS:  [name] is a 55 year old schizophrenic  gentleman who has been very stable, who presented to the office on the  day of admission complaining of a 54-hour history of nausea and  vomiting, poor p.o. intake of food and fluids.  He complained of  significant epigastric abdominal pain.  He was very uncomfortable, to  the point of tears.  Because of his discomfort and pain, he was admitted  to the hospital.   PAST MEDICAL HISTORY:  Well documented in the attached note from August 12, 2006, that is on the chart.   ADMISSION PHYSICAL EXAMINATION:  VITAL SIGNS:  Temperature of 99.5,  blood pressure 150/91, pulse 87, weight 236 pounds.  GENERAL:  This is a heavy-set African American gentleman who was  uncomfortable.  HEENT: Exam was unremarkable.  NECK:  Supple.  CHEST:  Clear.  CARDIOVASCULAR:  With 2+ radial pulses.  No JVD or carotid bruits.  He  had a regular rate and rhythm.  ABDOMEN: No bowel sounds were appreciated versus hypoactive bowel sounds  in the lower quadrants.  He had no organosplenomegaly.  He was tender  the epigastrium.  RECTAL:  Exam was unremarkable and heme-negative.  MUSCULOSKELETAL:  Exam was normal.   HOSPITAL COURSE:  The patient was admitted with acute abdominal pain.  Labs were ordered including lipase and amylase, liver function studies.  The patient also complained of dysuria and a CBC and UA were ordered.  These labs revealed the patient to have a white count of 19,700 with 81%  neutrophils, 15% lymphocytes, 4% monocytes.  Hemoglobin was 15.5 gm,  platelet count was 332,000.  Chemistries were unremarkable except for  potassium of 3.3, glucose was 130, BUN 10, creatinine 1.39.  Liver  functions were normal with a normal amylase, normal lipase.  Thyroid  function was checked and was normal with a TSH of 0.997.  Urinalysis was  negative.  The patient's abdominal pain was thought not to be pancreatic  in nature; suspected acute gastritis.  The patient did have heme  positive stool.  He was continued on PPIs and GI was called to see the  patient for further evaluation.  With leukocytosis and fever, there  was  concern for possible prostatitis.  The patient was started on Rocephin.  The following morning, the patient was afebrile.  The patient was  continued on IV antibiotics.  Consult was obtained on July 22.  By July  23, the patient decided to leave the hospital against medical advice.  Because he left before being seen without opportunities for consultation  or discharge instructions, he was not continued on antibiotics.   He is to follow up in the office at his discretion.      Rosalyn Gess Norins, MD  Electronically Signed     MEN/MEDQ  D:  12/29/2006  T:  12/30/2006  Job:  [id number]

## 2010-09-04 NOTE — Assessment & Plan Note (Signed)
[facility]                           PRIMARY CARE OFFICE NOTE   [name], [name]                       MRN:          [id number]  DATE:08/12/2006                            DOB:          Apr 01, 1956    [name] [name] is a 55 year old, African-American gentleman who has been  followed long-term for a history of schizophrenia that has been well  managed. He had not been seen for some time, 3 years, but returned to  the office July 19, 2006 with hypertension. He was started on  lisinopril. The patient reports that although he has tolerated this  generally well, he has had significant problems with impotence and is  starting medications.   PAST MEDICAL HISTORY:   SURGERY:  1. Excision of a thyroid mass.  2. Exploratory laparotomy in 1988 with no diagnosis.  3. Close surgical ventral hernia.   MEDICAL ILLNESSES:  1. Usual childhood diseases.  2. Hypertension.  3. GERD.  4. Perirectal abscess, recurrent.  5. Schizophrenia.  6. History of pancreatitis.  7. Obesity.  8. Depression/anxiety.  9. Glucose intolerance.  10.Allergies.  11.History of GI bleed.  12.Scoliosis.  13.Hemorrhoids.   CURRENT MEDICATIONS:  1. Thiothixene 20 mg nightly.  2. Lisinopril 10 mg daily.   ALLERGIES:  The patient is intolerant of REGLAN, IVP and CELEBREX.   SOCIAL HISTORY:  The patient is in a stable long-term marriage. He does  care for his father-in-law. He has been stable although his employment  history is checkered. He is planning to get a timecard as he says.   FAMILY HISTORY:  Significant for prostate cancer and hypertension.   REVIEW OF SYSTEMS:  Negative for any constitutional, cardiovascular,  respiratory or GI problems. GU significant for recurrent perirectal  abscess. No musculoskeletal or neurologic complaints. Psychiatric is  stable with the patient being followed by the mental health department.   CHART REVIEW:  The patient last had a colonoscopy  in 2000 which was  normal except for hemorrhoids. His last hospitalization was in 2001.  Last orthopedic consult note from 2002 where he had some neck discomfort  and had an MRI which showed central protrusion at C3-4 but otherwise was  unremarkable.   RADIOLOGY REVIEW:  The patient had an MRI of the brain January 31, 2003  which was unremarkable. The patient had esophagram October 05, 1999 for a  stricture which was unremarkable with no stricture established. His last  EKG from 2000 was normal.   PHYSICAL EXAMINATION:  VITAL SIGNS:  Temperature was 98.5, blood  pressure 137/79, pulse 93, weight 231 down from 256.  GENERAL:  This is an overweight African-American gentleman in no acute  distress. He does have an unusual speech pattern that is related to  missing his front teeth and I think possibly to his medication.  HEENT:  Normocephalic, atraumatic. EACs and TMs were normal. Oropharynx  revealed that his dentition seemed stable. He is missing several teeth,  no gingivitis is noted, no buccal lesions were noted, the posterior  pharynx was clear. Conjunctiva and sclera was clear. Pupils equal round  and reactive  to light and accommodation. Funduscopic exam was  unremarkable.  NECK:  Supple without thyromegaly.  NODES:  No adenopathy was noted in the cervical or supraclavicular  regions.  CHEST:  No CVA tenderness.  LUNGS:  Clear to auscultation and percussion.  CARDIOVASCULAR:  2+ radial pulses, no JVD or carotid bruits. He had a  quiet precordium with a regular rate and rhythm without murmurs, rubs or  gallops.  ABDOMEN:  Soft. He had positive bowel sounds in all four quadrants. He  has a ventral surgical scar. He does have a ventral hernia at the top of  the scar above the umbilicus which is easily reducible and nontender.  There was no organosplenomegaly.  RECTAL:  Revealed normal sphincter tone. The prostate was smooth and  normal in size and contour.  EXTREMITIES:  Without  clubbing, cyanosis, edema or deformity.  NEUROLOGIC:  The patient does have an unusual speech pattern but  otherwise his examination is nonfocal.   DATABASE:  Hemoglobin was 14.4 grams, white count 8000 with a normal  differential Chemistries revealed a sodium of 140, potassium slightly  low at 3.3, blood sugars 103, kidney function is normal with a  creatinine of 1.2 and GFR of 83 mL per minute. Liver functions were  normal. Cholesterol 191, triglycerides 177, HDL 33.8, LDL 122, thyroid  function normal with a TSH of 1.33. PSA was normal at 2.23. Urinalysis  was negative.   ASSESSMENT/PLAN:  1. Psychiatric. The patient is stable on his present medications and      will continue the same. Liver functions are normal.  2. Hypertension. The patient had a good response to lisinopril with      well-controlled blood pressure but intolerable side effect of      impotence.   PLAN:  1. Discontinue lisinopril, start diltiazem 180 mg daily.  2. Samples of Cardizem LA provided and the patient was given a      prescription for Diltia XT.  3. Electrolytes. The patient is slightly low on potassium. Would have      him increase his potassium intake.  4. Health maintenance. The patient is due for a followup colonoscopy      since it has been 8 years since his last study. He should have a      followup study in 2010. The patient otherwise is current and up-to-      date with a normal examination. He is asked to return for a blood      pressure check in 2-3 weeks at his convenience.     Rosalyn Gess Norins, MD  Electronically Signed    MEN/MedQ  DD: 08/12/2006  DT: 08/12/2006  Job #: [id number]   cc:   Harvie Christell Constant

## 2010-09-04 NOTE — Op Note (Signed)
[facility]  Patient:    [name], [name]                       MRN: [id number] Proc. Date: 06/17/00 Attending:  Fayrene Fearing P. Aplington, M.D.                           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  OPERATIONS: 1. Right shoulder arthroscopy (normal exam). 2. Arthroscopic subacromion decompression.  SURGEON:  Illene Labrador. Aplington, M.D.  ASSISTANT:  Della Goo, P.A.-C.  ANESTHESIA:  General.  PATHOLOGY AND JUSTIFICATION FOR PROCEDURE:  Chronic pain in the right shoulder with impingement-like symptoms.  An MRI has demonstrated partial rotator cuff tear.  The glenohumeral joint looking unremarkable.  He had some hypertrophic changes at the [facility] joint but was nontender there on exam.  He had a type 2 acromion.  Because of the problems, he is here for the above-mentioned surgery.  DESCRIPTION OF PROCEDURE:  Satisfactory general anesthesia, in the beach chair position on the Schlein frame.  The right shoulder girdle was prepped with DuraPrep and draped in a sterile field.  The anatomy of the shoulder was marked out and through a posterior soft spot, which was infiltrated with 0.5% Marcaine with adrenalin, I atraumatically entered the glenohumeral joint, which was normal on exam.  Representative pictures of the rotator cuff, biceps tendon, humeral head, and glenoid labrum were taken.  I then redirected the scope into the subacromion area and through a lateral portal, which also was infiltrated with 0.5% Marcaine and adrenalin, I entered the subacromial space which had also been infiltrated with the same.  He had a very hypertrophic, thick bursa, and it was very tight getting beneath the acromion.  I took a representative picture showing this tightness.  With the 4.2  shaver, I was able to do a bursectomy and partial denuding of the anterior acromion, and I then brought in the tissue ablator and removed most of the soft tissue including the cortical acromion ligament from the anterior acromion.  I then used a 4.0 bur and began burring down the underneath surface of the acromion until there was a wide subacromial decompression.  I alternated back and forth between the bur and the 4.2 shaver until all soft tissue and bone, which might be causing impingement, had been removed.  This was confirmed by taking pictures with the shaver in the subacromial space with the arm at the side and also abducted with wide clearance.  Shoulder joint was then infiltrated once again with 0.5% Marcaine with adrenalin as were the two portals which were closed with 4-0 nylon.  Betadine, Adaptic dry sterile dressing, and shoulder immobilizer were applied.  He tolerated the procedure well and was taken to the recovery room in satisfactory condition with no known complications at the time of this dictation. DD:  06/17/00 TD:  06/19/00 Job: [id number]

## 2010-09-04 NOTE — Consult Note (Signed)
[place]  Patient:    [name], [name] Visit Number: [id number] MRN: [id number]          Service Type: EMS Location: ED Attending Physician:  Devoria Albe Dictated by:   [id number] Proc. Date: 08/20/99 Admit Date:  08/20/1999 Discharge Date: 08/20/1999   CC:         Rosalyn Gess. Norins, M.D. [facility]                          Consultation Report  EMERGENCY ROOM  REASON FOR CONSULTATION: Asked to see per Dr. Hyacinth Meeker, EDP, for elevated blood sugar.  HISTORY OF PRESENT ILLNESS: [name] is a 55 year old black male seen in the emergency room this evening approximately 24 hours after starting Celebrex and Celexa for shoulder tendonitis and depression and anxiety as an outpatient.  H suffered acute onset of GI upset with nausea and vomiting, now resolved after treatment in the emergency room with IV fluids and pain medication.  ALLERGIES:  1. RITALIN.  2. REGLAN.  CURRENT MEDICATIONS:  1. Navane, unknown dose.  2. Cogentin, unknown dose.  3. Celexa 10 mg p.o. q.d.  4. Celebrex 200 mg b.i.d. p.r.n.  PAST MEDICAL HISTORY:  1. History of schizophrenia.  2. History of pancreatitis, not alcoholic per patient.  PAST SURGICAL HISTORY: Multiple surgeries including vocal cord, hemorrhoids, ganglion cyst, thyroid, appendectomy, and apparently exploratory laparotomy x 2 - the first in [place] years ago and the second approximately four years ago, unknown reason or results per Dr. Francina Ames locally.  SOCIAL HISTORY: Tobacco, one to two packs per day.  Alcohol, none.  Disabled for psychiatric reasons.  FAMILY HISTORY: Diabetes, hypertension.  PHYSICAL EXAMINATION:  GENERAL: [name] is alert and oriented x 3.  VITAL SIGNS: Afebrile, blood pressure 165/98, respirations 18.  HEENT: Sclerae clear. TMs clear.  Pharynx benign.  NECK: No JVD or thyromegaly.  CHEST: No rales or wheezing.  CARDIAC: Regular rhythm.  ABDOMEN: Soft, nontender, positive  bowel sounds, no organomegaly, no masses.  EXTREMITIES: No edema.  LABORATORY DATA: CBC, CMET, and amylase normal in emergency room.  Glucose 210.  ASSESSMENT/PLAN:  1. Diabetes mellitus.  Okay to send home.  Will start new prescription for     Actos 15 mg p.o. q.d.  He is to check CBGs at home q.a.m. as his mother     has a machine and lives just around the corner.  If greater than 200 he     will take the Actos and if less than 200 hold the medication that day.  I     suspect he may not need medications if diet is poor related to problem #2.  2. Nausea and vomiting and abdominal discomfort, questionable etiology.     Suspect drug related.  Sugars resolved quickly by holding Celexa and     Celebrex.  Will recommend clear liquids and advance as tolerated, and     certainly he will need to let us know if symptoms recur.  3. Disposition.  Call the office tomorrow with a progress report regarding     symptoms and CBG.  To have office visit on Aug 24, 1999 with myself or     Dr. Debby Bud. Dictated by:   [id number] Attending Physician:  Devoria Albe DD:  08/20/99 TD:  08/21/99 Job: [id number]

## 2010-09-22 ENCOUNTER — Other Ambulatory Visit: Payer: Self-pay | Admitting: *Deleted

## 2010-10-02 ENCOUNTER — Emergency Department (HOSPITAL_COMMUNITY)
Admission: EM | Admit: 2010-10-02 | Discharge: 2010-10-03 | Disposition: A | Discharge status: Home / Self Care | Payer: BC Managed Care – HMO | Attending: Emergency Medicine | Admitting: Emergency Medicine

## 2010-10-02 ENCOUNTER — Emergency Department (HOSPITAL_COMMUNITY): Payer: BC Managed Care – HMO

## 2010-10-02 DIAGNOSIS — E876 Hypokalemia: Secondary | ICD-10-CM | POA: Insufficient documentation

## 2010-10-02 DIAGNOSIS — R079 Chest pain, unspecified: Secondary | ICD-10-CM | POA: Insufficient documentation

## 2010-10-02 DIAGNOSIS — I10 Essential (primary) hypertension: Secondary | ICD-10-CM | POA: Insufficient documentation

## 2010-10-02 DIAGNOSIS — R1011 Right upper quadrant pain: Secondary | ICD-10-CM | POA: Insufficient documentation

## 2010-10-02 DIAGNOSIS — F209 Schizophrenia, unspecified: Secondary | ICD-10-CM | POA: Insufficient documentation

## 2010-10-02 LAB — CBC
MCH: 29.3 pg (ref 26.0–34.0)
MCV: 82.3 fL (ref 78.0–100.0)
Platelets: 348 10*3/uL (ref 150–400)
RDW: 14.3 % (ref 11.5–15.5)
WBC: 13.4 10*3/uL — ABNORMAL HIGH (ref 4.0–10.5)

## 2010-10-02 LAB — COMPREHENSIVE METABOLIC PANEL
Alkaline Phosphatase: 89 U/L (ref 39–117)
BUN: 8 mg/dL (ref 6–23)
Calcium: 9.8 mg/dL (ref 8.4–10.5)
GFR calc Af Amer: >60 mL/min (ref >60)
Glucose, Bld: 110 mg/dL — ABNORMAL HIGH (ref 70–99)
Total Protein: 7.7 g/dL (ref 6.0–8.3)

## 2010-10-02 LAB — DIFFERENTIAL
Basophils Relative: 0 % (ref 0–1)
Eosinophils Absolute: 0.1 10*3/uL (ref 0.0–0.7)
Eosinophils Relative: 1 % (ref 0–5)
Lymphs Abs: 4.5 10*3/uL — ABNORMAL HIGH (ref 0.7–4.0)

## 2010-10-02 LAB — LIPASE, BLOOD: Lipase: 34 U/L (ref 11–59)

## 2010-10-07 ENCOUNTER — Encounter: Payer: Self-pay | Admitting: Internal Medicine

## 2010-10-07 ENCOUNTER — Other Ambulatory Visit: Payer: BC Managed Care – HMO

## 2010-10-07 ENCOUNTER — Other Ambulatory Visit (INDEPENDENT_AMBULATORY_CARE_PROVIDER_SITE_OTHER): Payer: BC Managed Care – HMO

## 2010-10-07 ENCOUNTER — Emergency Department (HOSPITAL_COMMUNITY)
Admission: EM | Admit: 2010-10-07 | Discharge: 2010-10-08 | Discharge status: Against Medical Advice | Payer: BC Managed Care – HMO | Attending: Emergency Medicine | Admitting: Emergency Medicine

## 2010-10-07 ENCOUNTER — Ambulatory Visit (INDEPENDENT_AMBULATORY_CARE_PROVIDER_SITE_OTHER): Payer: BC Managed Care – HMO | Admitting: Internal Medicine

## 2010-10-07 VITALS — BP 148/100 | HR 95 | Temp 98.6°F | Ht 71.0 in | Wt 264.8 lb

## 2010-10-07 DIAGNOSIS — R1011 Right upper quadrant pain: Secondary | ICD-10-CM

## 2010-10-07 LAB — CBC WITH DIFFERENTIAL/PLATELET
Basophils Absolute: 0 10*3/uL (ref 0.0–0.1)
Eosinophils Absolute: 0.1 10*3/uL (ref 0.0–0.7)
Eosinophils Relative: 0.8 % (ref 0.0–5.0)
MCV: 88.4 fl (ref 78.0–100.0)
Monocytes Absolute: 1 10*3/uL (ref 0.1–1.0)
Neutrophils Relative %: 66.4 % (ref 43.0–77.0)
Platelets: 304 10*3/uL (ref 150.0–400.0)
RDW: 15.3 % — ABNORMAL HIGH (ref 11.5–14.6)
WBC: 14 10*3/uL — ABNORMAL HIGH (ref 4.5–10.5)

## 2010-10-07 LAB — BASIC METABOLIC PANEL
GFR: 80.5 mL/min (ref >60.00)
Glucose, Bld: 102 mg/dL — ABNORMAL HIGH (ref 70–99)
Potassium: 3.6 mEq/L (ref 3.5–5.1)
Sodium: 138 mEq/L (ref 135–145)

## 2010-10-07 LAB — HEPATIC FUNCTION PANEL
AST: 25 U/L (ref 0–37)
Alkaline Phosphatase: 90 U/L (ref 39–117)
Bilirubin, Direct: 0.1 mg/dL (ref 0.0–0.3)
Total Bilirubin: 0.5 mg/dL (ref 0.3–1.2)

## 2010-10-07 NOTE — Progress Notes (Signed)
Subjective:    Patient ID: [name], male    DOB: May 24, 1955, 55 y.o.   MRN: [id number]  HPI complains of RUQ and rib pain Denies trauma Onset 6 days ago - seen in ER 4 days ago due to same Labs, EKG and xray reviewed - low potassium (replaced) but no other significant abnormality No nausea and vomiting, no bowel changes, no cough or shortness of breath Pain worse with any food intake but improved with otc mylanta "prevacid not working like the old acid medicine"  Past Medical History  Diagnosis Date  . Hyperlipidemia   . Hypertension   . Depression   . Allergy   . Anxiety   . ALLERGIC RHINITIS 01/25/2007  . ANXIETY DISORDER, GENERALIZED 01/25/2007  . DEPRESSION 01/25/2007  . ESOPHAGITIS 12/28/2007  . GERD 01/25/2007  . GLUCOSE INTOLERANCE, HX OF 01/25/2007  . HEMORRHOIDS, INTERNAL, WITH BLEEDING 04/26/2008  . HYPERLIPIDEMIA 12/27/2007  . HYPERTENSION, ESSENTIAL NOS 01/25/2007  . Hyperthyroidism 08/11/2010  . HYPERTROPHY PROSTATE W/UR OBST & OTH LUTS 02/27/2010  . 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 LOW BACK PAIN, CHRONIC 11/08/2007  . 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 SCOLIOSIS NEC 01/25/2007  . Sebaceous cyst 08/10/2010  . SMOKER 08/05/2009  . UPPER GASTROINTESTINAL HEMORRHAGE 01/25/2007     Review of Systems  Constitutional: Negative for fever.  Respiratory: Negative for cough.   Cardiovascular: Negative for chest pain.       Objective:   Physical Exam BP 148/100  Pulse 95  Temp(Src) 98.6 F (37 C) (Oral)  Ht 5\' 11"  (1.803 m)  Wt 264 lb 12.8 oz (120.112 kg)  BMI 36.93 kg/m2  SpO2 98%  Physical Exam  Constitutional:  oriented to person, place, and time. appears well-developed and well-nourished. Uncomfortable but non toxic Neck: Normal range of motion. Neck supple. No JVD present. No thyromegaly present.  Cardiovascular: Normal rate, regular rhythm and normal heart sounds.  No murmur heard. no BLE edema Pulmonary/Chest: Effort normal and breath sounds  normal. No respiratory distress. no wheezes.  Abdominal: Soft. Bowel sounds are normal. no distension. There is no tenderness with palpation but pt points to chole scar as site of pain.  Neurological: he is alert and oriented to person, place, and time. No cranial nerve deficit. Coordination normal.  Skin: Skin is warm and dry.  No erythema or ulceration. no shingles at site of pain Psychiatric: he has a normal mood and affect. behavior is normal. Judgment and thought content normal.       Lab Results  Component Value Date   WBC 13.4* 10/02/2010   HGB 15.1 10/02/2010   HCT 42.4 10/02/2010   PLT 348 10/02/2010   CHOL 259* 08/10/2010   TRIG 217.0* 08/10/2010   HDL 45.00 08/10/2010   LDLDIRECT 184.8 08/10/2010   ALT 27 10/02/2010   AST 16 10/02/2010   NA 138 10/02/2010   K 3.2* 10/03/2010   CL 100 10/02/2010   CREATININE 1.27 10/02/2010   BUN 8 10/02/2010   CO2 27 10/02/2010   TSH 1.53 08/10/2010   PSA 2.22 02/27/2010   INR 1.0 06/10/2008   HGBA1C 5.7 08/10/2010   Lab Results  Component Value Date   LIPASE 34 10/02/2010    Assessment & Plan:  RUQ pain- ?GERD given temporary relief with mylanta - labs unremarkable 6/15 but will repeat today rule out pancreatic or LFT or anemia problem - change PPI for next 5 days - sample Nexium provided and pt understands to hold prevacid while  on nexium - doubt shingles - to follow up with PCP as planned if unresolved

## 2010-10-07 NOTE — Patient Instructions (Signed)
It was good to see you today. Test(s) ordered today. Your results will be called to you after review (48-72hours after test completion). If any changes need to be made, you will be notified at that time. Take Nexium in place of Prevacid for next 5 days, ok to use with mylanta if needed

## 2010-10-08 ENCOUNTER — Encounter: Payer: Self-pay | Admitting: Internal Medicine

## 2010-10-08 ENCOUNTER — Ambulatory Visit (INDEPENDENT_AMBULATORY_CARE_PROVIDER_SITE_OTHER): Payer: BC Managed Care – HMO | Admitting: Internal Medicine

## 2010-10-08 VITALS — BP 138/90 | HR 94 | Temp 97.6°F | Wt 262.0 lb

## 2010-10-08 DIAGNOSIS — R1011 Right upper quadrant pain: Secondary | ICD-10-CM

## 2010-10-08 NOTE — Patient Instructions (Addendum)
Right upper quadrant abdominal pain. Normal lab work 6/18 and 6/20. Normal plain x-rays. ON exam - tender in the right upper quadrant to deep palpation. May be stone in the bile duct vs a pulled muscle. Plan - CT abdomen to look for stone or other internal derangement. For pain - tylenol should be safe to try. Use of aspercreme or icy-hot over the area that is sore may help.

## 2010-10-08 NOTE — Progress Notes (Signed)
  Subjective:    Patient ID: [name], male    DOB: [birth year], 55 y.o.   MRN: [id number]  HPI [name] presents for ED follow-up. He was seen last week at [facility] and was given something from for pain in the RUQ in the area of previous GB surgery. He has pain that radiates all the way back to his Spine. He went to the ED Sunday the 18th - report reviewed: normal lab except for low potassium. He had KUB and chest x-ray that were normal. He saw Dr. Felicity Coyer 6/20 -= normal exam. Repeat labs were normal.  PMH, FamHx and SocHx reviewed for any changes and relevance. Reviewed ED notes and Dr. Diamantina Monks note.    Review of Systems Review of Systems  Constitutional:  Negative for fever, chills, activity change and unexpected weight change.  HEENT:  Negative for hearing loss, ear pain, congestion, neck stiffness and postnasal drip. Negative for sore throat or swallowing problems. Negative for dental complaints.   Eyes: Negative for vision loss or change in visual acuity.  Respiratory: Negative for chest tightness and wheezing.   Cardiovascular: Negative for chest pain and palpitatio. nNo decreased exercise tolerance Gastrointestinal: No change in bowel habit. No bloating or gas. No reflux or indigestion. Continued pain in the RUQ Genitourinary: Negative for urgency, frequency, flank pain and difficulty urinating.  Musculoskeletal: Negative for myalgias, back pain, arthralgias and gait problem.  Neurological: Negative for dizziness, tremors, weakness and headaches.  Hematological: Negative for adenopathy.  Psychiatric/Behavioral: Negative for behavioral problems and dysphoric mood.       Objective:   Physical Exam Vitals noted - overweight Gen'l - overweight AA male in no acute distress HEENT - normal, C&S clear without icterus Pul - normal respirations, no rales or wheeze Cor - RRR Abdomen- overweight, BS+ x 4, no HSM on palpation. No guarding or rebound. Tender to  percussion and deep palpation RUQ. MSK - no intercostal tenderness. No CVAT, no deformity of chest wall, no pain with deep inspiration or movement of the torso.       Assessment & Plan:  Abdominal pain RUQ - he has had 4 exams in 10 days that are unrevealing. He has had full labs that are normal June 18th and 20th. KUB was normal. He is in no acute distress today.  Plan - CT abdomen

## 2010-10-09 ENCOUNTER — Ambulatory Visit (HOSPITAL_COMMUNITY)
Admission: RE | Admit: 2010-10-09 | Discharge: 2010-10-09 | Disposition: A | Discharge status: Home / Self Care | Payer: BC Managed Care – HMO | Source: Ambulatory Visit | Attending: Internal Medicine | Admitting: Internal Medicine

## 2010-10-09 ENCOUNTER — Emergency Department (HOSPITAL_COMMUNITY)
Admission: EM | Admit: 2010-10-09 | Discharge: 2010-10-09 | Discharge status: Against Medical Advice | Payer: BC Managed Care – HMO | Attending: Emergency Medicine | Admitting: Emergency Medicine

## 2010-10-09 DIAGNOSIS — K760 Fatty (change of) liver, not elsewhere classified: Secondary | ICD-10-CM

## 2010-10-09 DIAGNOSIS — K7689 Other specified diseases of liver: Secondary | ICD-10-CM | POA: Insufficient documentation

## 2010-10-09 DIAGNOSIS — M549 Dorsalgia, unspecified: Secondary | ICD-10-CM | POA: Insufficient documentation

## 2010-10-09 DIAGNOSIS — Z9089 Acquired absence of other organs: Secondary | ICD-10-CM | POA: Insufficient documentation

## 2010-10-09 DIAGNOSIS — R1011 Right upper quadrant pain: Secondary | ICD-10-CM

## 2010-10-09 DIAGNOSIS — J984 Other disorders of lung: Secondary | ICD-10-CM | POA: Insufficient documentation

## 2010-10-09 DIAGNOSIS — R109 Unspecified abdominal pain: Secondary | ICD-10-CM | POA: Insufficient documentation

## 2010-10-09 HISTORY — DX: Fatty (change of) liver, not elsewhere classified: K76.0

## 2010-10-12 ENCOUNTER — Other Ambulatory Visit (HOSPITAL_COMMUNITY): Payer: BC Managed Care – HMO

## 2010-10-12 ENCOUNTER — Telehealth: Payer: Self-pay | Admitting: Internal Medicine

## 2010-10-12 NOTE — Telephone Encounter (Signed)
CT scan is normal. Please call patient with report.  Thanks

## 2010-10-12 NOTE — Telephone Encounter (Signed)
Patient notified of results per MD. 

## 2010-10-20 ENCOUNTER — Telehealth: Payer: Self-pay | Admitting: *Deleted

## 2010-10-20 NOTE — Telephone Encounter (Signed)
Spoke w/pt - abd discomfort has decrease some since last OV. He has been having new symptom - abd cramps in upper right quadrant. No fever, has reg BM's. Says he has tried muscle relaxers, muscle rub, tylenol, motrin and nothing helps. Pain/cramping is off and on x 2 weeks. He has had recent CT which was normal and says upper & lower endo in the last 3 mths.   What do you suggest pt do next?

## 2010-10-20 NOTE — Telephone Encounter (Signed)
Wife called - pt c/o cramps and wants rx from MD. Need to call pt for more info.

## 2010-10-21 NOTE — Telephone Encounter (Signed)
GI consultation - referral entered

## 2010-10-22 NOTE — Telephone Encounter (Signed)
Left pt vm that referral was in process.

## 2010-10-23 ENCOUNTER — Telehealth: Payer: Self-pay | Admitting: *Deleted

## 2010-10-23 MED ORDER — HYOSCYAMINE SULFATE 0.125 MG PO TABS: 0.1250 mg | ORAL_TABLET | ORAL | Status: AC | PRN

## 2010-10-23 NOTE — Telephone Encounter (Signed)
Hyoscyamine 0.125 mg every 4 hours as needed for cramping pain. #60, refill x 3

## 2010-10-23 NOTE — Telephone Encounter (Signed)
RX sent escript to pharmacy. Mother notified

## 2010-10-23 NOTE — Telephone Encounter (Signed)
Looks like pt has apt scheduled for GI in August. She says he is "cramping really bad" and wants advisement from MD prior to GI referral.

## 2010-11-23 ENCOUNTER — Encounter: Payer: Self-pay | Admitting: Internal Medicine

## 2010-11-23 ENCOUNTER — Ambulatory Visit (INDEPENDENT_AMBULATORY_CARE_PROVIDER_SITE_OTHER): Payer: BC Managed Care – HMO | Admitting: Internal Medicine

## 2010-11-23 VITALS — BP 136/74 | HR 74 | Ht 68.0 in | Wt 259.0 lb

## 2010-11-23 DIAGNOSIS — K219 Gastro-esophageal reflux disease without esophagitis: Secondary | ICD-10-CM

## 2010-11-23 DIAGNOSIS — R112 Nausea with vomiting, unspecified: Secondary | ICD-10-CM

## 2010-11-23 MED ORDER — LORAZEPAM 1 MG PO TABS: 1.0000 mg | ORAL_TABLET | Freq: Every day | ORAL | Status: DC

## 2010-11-23 MED ORDER — PANTOPRAZOLE SODIUM 40 MG PO TBEC: 40.0000 mg | DELAYED_RELEASE_TABLET | Freq: Every day | ORAL | Status: DC

## 2010-11-23 NOTE — Progress Notes (Signed)
[name] 11-Jul-1955 MRN [id number]      History of Present Illness:  This is a 55 year old African American male with schizophrenia, he has been fully functional. He has a history of gastroesophageal reflux and abdominal pain. He was recently seen in the emergency room with acute nausea and vomiting. A CT scan of the abdomen showed him to be status post cholecystectomy. He had a normal-appearing pancreas and an abdominal ventral hernia and fatty liver. His blood tests were normal including a lipase. A CT scan of the abdomen for a similar episode in October 2011 showed no active disease. He was on Nexium 40 mg a day until his insurance stopped coverage of the medication. He is now on Zantac when necessary. An upper endoscopy in January 2012 showed a 1-2 cm hiatal hernia. A colonoscopy at that time showed internal hemorrhoids. He is interested in cutting down on his Navane which contributes to his GI problems and would like something else for sleep.   Past Medical History  Diagnosis Date  . Hyperlipidemia   . Hypertension   . Depression   . Allergy   . Anxiety   . ALLERGIC RHINITIS 01/25/2007  . ANXIETY DISORDER, GENERALIZED 01/25/2007  . DEPRESSION 01/25/2007  . ESOPHAGITIS 12/28/2007  . GERD 01/25/2007  . GLUCOSE INTOLERANCE, HX OF 01/25/2007  . HEMORRHOIDS, INTERNAL, WITH BLEEDING 04/26/2008  . HYPERLIPIDEMIA 12/27/2007  . HYPERTENSION, ESSENTIAL NOS 01/25/2007  . Hyperthyroidism 08/11/2010  . HYPERTROPHY PROSTATE W/UR OBST & OTH LUTS 02/27/2010  . 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 LOW BACK PAIN, CHRONIC 11/08/2007  . 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 SCOLIOSIS NEC 01/25/2007  . Sebaceous cyst 08/10/2010  . SMOKER 08/05/2009  . UPPER GASTROINTESTINAL HEMORRHAGE 01/25/2007  . Rectal abscess   . Pancreatitis   . Fatty liver 10/09/2010  . Hypokalemia   . Hiatal hernia    Past Surgical History  Procedure Date  . Excision of thyroid mass   . Abdominal exploration  surgery 1988  . Hernia repair     ventral  . Cholecystectomy 05/20/08    Dr. Abbey Chatters  . Excision of scalp lesion 07/2009    reports that he has been smoking Cigarettes.  He has a 45 pack-year smoking history. He does not have any smokeless tobacco history on file. He reports that he does not drink alcohol or use illicit drugs. family history includes Hypertension in his father and mother and Prostate cancer in his other.  There is no history of Colon cancer. Allergies  Allergen Reactions  . Celecoxib     REACTION: rectal bleeding  . Iohexol   . Metoclopramide Hcl         Review of Systems:  The remainder of the 10  point ROS is negative except as outlined in H&P   Physical Exam: General appearance  Well developed, in no distress. Eyes- non icteric. HEENT nontraumatic, normocephalic. Mouth no lesions, tongue papillated, no cheilosis. Neck supple without adenopathy, thyroid not enlarged, no carotid bruits, no JVD. Lungs Clear to auscultation bilaterally. Cor normal S1 normal S2, regular rhythm , no murmur,  quiet precordium. Abdomen large ventral hernia above the umbilicus, soft abdomen, no tenderness, liver edge at costal margin. Rectal: Not done. Extremities no pedal edema. Skin no lesions. Neurological alert and oriented x 3. Psychological normal mood and affect.  Assessment and Plan:   Problem #1 nausea and vomiting may be due to gastroesophageal reflux and hyperacidity. Samples of Dexilant and prescription for pantoprazole 40 mg  a day have been given to the patient.  Problem #2 schizophrenia. Patient has been on Navane almost 34 years. Followed by the mental health center. He would like to taper the medicine and substitute it for another tranquilizer. We will start him on Ativan 1 mg at bedtime for sleep. He may taper down to Navane 15 mg a day.    11/23/2010 Lina Sar

## 2010-11-23 NOTE — Patient Instructions (Addendum)
We have sent the following medications to your pharmacy for you to pick up at your convenience: Pantoprazole 40 mg tablet daily. Ativan 1 mg. Take 1 tablet by mouth at night.  Dr Debby Bud

## 2010-12-28 ENCOUNTER — Telehealth: Payer: Self-pay | Admitting: *Deleted

## 2010-12-28 NOTE — Telephone Encounter (Signed)
He refuses to see [facility] and is in the process of looking for a new psychiatrist.

## 2010-12-28 NOTE — Telephone Encounter (Signed)
Spoke w/pt - he went to [facility] as a walk in today. Waited from 8 to 11, was not seen by MD and then went home. He is req alt rx to [place] as it causes his "intestines to go to sleep". He was given lorazepam by Dr Juanda Chance but is out of this med. Please advise.

## 2010-12-28 NOTE — Telephone Encounter (Signed)
This type of med change is up to mental health!

## 2011-01-13 ENCOUNTER — Encounter: Payer: Self-pay | Admitting: Endocrinology

## 2011-01-13 ENCOUNTER — Ambulatory Visit (INDEPENDENT_AMBULATORY_CARE_PROVIDER_SITE_OTHER): Payer: BC Managed Care – HMO | Admitting: Endocrinology

## 2011-01-13 DIAGNOSIS — R252 Cramp and spasm: Secondary | ICD-10-CM

## 2011-01-13 MED ORDER — DOXYCYCLINE HYCLATE 100 MG PO TABS: 100.0000 mg | ORAL_TABLET | Freq: Two times a day (BID) | ORAL | Status: AC

## 2011-01-13 NOTE — Patient Instructions (Addendum)
i have sent a prescription to your pharmacy, for an antibiotic. Please see dr Debby Bud soon, to follow-up your blood pressure. please try taking the risperdone as prescribed by dr taylor--this may help the muscle spasms , as well as helping you sleep.

## 2011-01-13 NOTE — Progress Notes (Signed)
Subjective:    Patient ID: [name] Cruise Sr., male    DOB: 1955-11-04, 55 y.o.   MRN: [id number]  HPI Pt states few weeks of moderate worsening of his chronic muscle spasms, worst at the lower back and legs.  This started in the context of finding a tick at the back of his neck.  risperdone helps (rx'ed by dr taylor--[facility]).    Past Medical History  Diagnosis Date  . Hyperlipidemia   . Hypertension   . Depression   . Allergy   . Anxiety   . ALLERGIC RHINITIS 01/25/2007  . ANXIETY DISORDER, GENERALIZED 01/25/2007  . DEPRESSION 01/25/2007  . ESOPHAGITIS 12/28/2007  . GERD 01/25/2007  . GLUCOSE INTOLERANCE, HX OF 01/25/2007  . HEMORRHOIDS, INTERNAL, WITH BLEEDING 04/26/2008  . HYPERLIPIDEMIA 12/27/2007  . HYPERTENSION, ESSENTIAL NOS 01/25/2007  . Hyperthyroidism 08/11/2010  . HYPERTROPHY PROSTATE W/UR OBST & OTH LUTS 02/27/2010  . 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 LOW BACK PAIN, CHRONIC 11/08/2007  . 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 SCOLIOSIS NEC 01/25/2007  . Sebaceous cyst 08/10/2010  . SMOKER 08/05/2009  . UPPER GASTROINTESTINAL HEMORRHAGE 01/25/2007  . Rectal abscess   . Pancreatitis   . Fatty liver 10/09/2010  . Hypokalemia   . Hiatal hernia     Past Surgical History  Procedure Date  . Excision of thyroid mass   . Abdominal exploration surgery 1988  . Hernia repair     ventral  . Cholecystectomy 05/20/08    Dr. Abbey Chatters  . Excision of scalp lesion 07/2009    History   Social History  . Marital Status: Married    Spouse Name: N/A    Number of Children: 2  . Years of Education: N/A   Occupational History  . Disabled    Social History Main Topics  . Smoking status: Current Everyday Smoker -- 1.5 packs/day for 30 years    Types: Cigarettes  . Smokeless tobacco: Not on file  . Alcohol Use: No  . Drug Use: No  . Sexually Active: Not Currently   Other Topics Concern  . Not on file   Social History Narrative  . No narrative on file    Current  Outpatient Prescriptions on File Prior to Visit  Medication Sig Dispense Refill  . amLODipine (NORVASC) 10 MG tablet Take 10 mg by mouth daily.        . cyclobenzaprine (FLEXERIL) 10 MG tablet Take 10 mg by mouth 2 (two) times daily as needed.        [name] LORazepam (ATIVAN) 1 MG tablet Take 1 tablet (1 mg total) by mouth at bedtime.  30 tablet  2  . pantoprazole (PROTONIX) 40 MG tablet Take 1 tablet (40 mg total) by mouth daily.  30 tablet  3    Allergies  Allergen Reactions  . Celecoxib     REACTION: rectal bleeding  . Iohexol   . Metoclopramide Hcl     Family History  Problem Relation Age of Onset  . Hypertension Father   . Hypertension Mother   . Prostate cancer Other   . Colon cancer Neg Hx    BP 142/86  Pulse 94  Temp(Src) 98 F (36.7 C) (Oral)  Ht 5\' 8"  (1.727 m)  Wt 258 lb (117.028 kg)  BMI 39.23 kg/m2  SpO2 97%  Review of Systems Denies fever.  He also has chronic headache.     Objective:   Physical Exam VITAL SIGNS:  See vs page GENERAL: no distress Skin:  no rash seen at the back of the neck.   Msk: strength is normal throughout the lower extremities.   Gait: normal and steady.       Assessment & Plan:  Tick bite, new Muscle cramps, worse.  It is very unlikely to be related to tick bite Htn, prob exac by pain Depression.  rx of this might help cramp sxs

## 2011-01-27 ENCOUNTER — Ambulatory Visit (INDEPENDENT_AMBULATORY_CARE_PROVIDER_SITE_OTHER): Payer: BC Managed Care – HMO | Admitting: Internal Medicine

## 2011-01-27 ENCOUNTER — Encounter: Payer: Self-pay | Admitting: Internal Medicine

## 2011-01-27 ENCOUNTER — Telehealth: Payer: Self-pay | Admitting: *Deleted

## 2011-01-27 ENCOUNTER — Other Ambulatory Visit (INDEPENDENT_AMBULATORY_CARE_PROVIDER_SITE_OTHER): Payer: BC Managed Care – HMO

## 2011-01-27 ENCOUNTER — Other Ambulatory Visit: Payer: Self-pay | Admitting: Internal Medicine

## 2011-01-27 ENCOUNTER — Ambulatory Visit (INDEPENDENT_AMBULATORY_CARE_PROVIDER_SITE_OTHER)
Admission: RE | Admit: 2011-01-27 | Discharge: 2011-01-27 | Disposition: A | Discharge status: Home / Self Care | Payer: BC Managed Care – HMO | Source: Ambulatory Visit | Attending: Internal Medicine | Admitting: Internal Medicine

## 2011-01-27 DIAGNOSIS — E785 Hyperlipidemia, unspecified: Secondary | ICD-10-CM

## 2011-01-27 DIAGNOSIS — R252 Cramp and spasm: Secondary | ICD-10-CM

## 2011-01-27 DIAGNOSIS — M545 Low back pain, unspecified: Secondary | ICD-10-CM

## 2011-01-27 DIAGNOSIS — Z8639 Personal history of other endocrine, nutritional and metabolic disease: Secondary | ICD-10-CM

## 2011-01-27 DIAGNOSIS — E875 Hyperkalemia: Secondary | ICD-10-CM

## 2011-01-27 DIAGNOSIS — Z862 Personal history of diseases of the blood and blood-forming organs and certain disorders involving the immune mechanism: Secondary | ICD-10-CM

## 2011-01-27 DIAGNOSIS — N529 Male erectile dysfunction, unspecified: Secondary | ICD-10-CM

## 2011-01-27 DIAGNOSIS — E0590 Thyrotoxicosis, unspecified without thyrotoxic crisis or storm: Secondary | ICD-10-CM

## 2011-01-27 DIAGNOSIS — I10 Essential (primary) hypertension: Secondary | ICD-10-CM

## 2011-01-27 DIAGNOSIS — M549 Dorsalgia, unspecified: Secondary | ICD-10-CM

## 2011-01-27 LAB — URINALYSIS, ROUTINE W REFLEX MICROSCOPIC
Leukocytes, UA: NEGATIVE
Nitrite: NEGATIVE
Specific Gravity, Urine: 1.01 (ref 1.000–1.030)
pH: 6 (ref 5.0–8.0)

## 2011-01-27 LAB — SEDIMENTATION RATE: Sed Rate: 45 mm/hr — ABNORMAL HIGH (ref 0–22)

## 2011-01-27 LAB — COMPREHENSIVE METABOLIC PANEL
ALT: 18 U/L (ref 0–53)
CO2: 34 mEq/L — ABNORMAL HIGH (ref 19–32)
Creatinine, Ser: 1.3 mg/dL (ref 0.4–1.5)
GFR: 74.68 mL/min (ref >60.00)
Glucose, Bld: 87 mg/dL (ref 70–99)
Total Bilirubin: 0.4 mg/dL (ref 0.3–1.2)

## 2011-01-27 LAB — TSH: TSH: 1.1 u[IU]/mL (ref 0.35–5.50)

## 2011-01-27 LAB — HEPATIC FUNCTION PANEL: Total Bilirubin: 0.4 mg/dL (ref 0.3–1.2)

## 2011-01-27 LAB — VITAMIN B12: Vitamin B-12: 184 pg/mL — ABNORMAL LOW (ref 211–911)

## 2011-01-27 LAB — LIPID PANEL: Total CHOL/HDL Ratio: 5

## 2011-01-27 LAB — HEMOGLOBIN A1C: Hgb A1c MFr Bld: 5.9 % (ref 4.6–6.5)

## 2011-01-27 MED ORDER — CYCLOBENZAPRINE HCL 10 MG PO TABS: 10.0000 mg | ORAL_TABLET | Freq: Three times a day (TID) | ORAL | Status: DC | PRN

## 2011-01-27 MED ORDER — POTASSIUM CHLORIDE CRYS ER 20 MEQ PO TBCR: EXTENDED_RELEASE_TABLET | ORAL | Status: DC

## 2011-01-27 MED ORDER — PREDNISONE 10 MG PO TABS: 10.0000 mg | ORAL_TABLET | Freq: Every day | ORAL | Status: AC

## 2011-01-27 MED ORDER — HYDROCODONE-ACETAMINOPHEN 5-325 MG PO TABS: 2.0000 | ORAL_TABLET | Freq: Four times a day (QID) | ORAL | Status: DC | PRN

## 2011-01-27 NOTE — Telephone Encounter (Signed)
LAB CALLED WITH CRITICAL Potassium 2.7. MD informed, needs to take 20 meq  2 tabs now, 2 tabs 5 hours later, 2 tabs in the AM and CHECK labs in the AM. RX called into pharm, Patient informed.

## 2011-01-27 NOTE — Patient Instructions (Signed)
Back and leg pain - concerned for a herniated disk as the source of your pain. Plan - x-rays of the back, if abnormal will do MRI. For pain will start a burst and taper of prednisone, will renew the flexeril, will start hydrocodone/APAP every 6 hours as needed.  Will also check your cholesterol, blood sugar, kidney function, electrolytes, testosterone.

## 2011-01-27 NOTE — Progress Notes (Signed)
  Subjective:    Patient ID: [name] Cruise Sr., male    DOB: Apr 18, 1956, 55 y.o.   MRN: [id number]  HPI [name] returns for continued pain right lower back with radiation to the the leg. He does admit to paresthesia and weakness. The pain is constant but is worse with standing or walking. He has no history or injury. His last lumbar spine films were in '09 and were normal with well preserved disk spaces. He has had ganglion cysts excised from the right foot in the past. He gets no relief with NSAIDs.   [name] c/o increased cramping in his legs and back.  He is very emotional during this visit - tearful   Review of Systems System review is negative for any constitutional, cardiac, pulmonary, GI or neuro symptoms or complaints other than as described in the HPI.     Objective:   Physical Exam Vitals - stable and normal Gen'l - heavy set AA male who is tearful and upset HEENT - injected C&S clear, some clouding of the iris/pupil Cor - RRR MSK-Back exam: normal stand; flex to greater than 40 degrees; slow gait favoring right leg; pain with toe walk, ok with heel walk.; able to step up to exam table with difficulty; normal SLR sitting with some pain right back; normal DTRs at the patellar tendons (hyperreflexic; normal sensation to light touch, pin-prick and deep vibratory stimulus; no  CVA tenderness; able to move supine to sitting witout assistance.        Assessment & Plan:

## 2011-01-28 ENCOUNTER — Telehealth: Payer: Self-pay | Admitting: Internal Medicine

## 2011-01-28 ENCOUNTER — Other Ambulatory Visit (INDEPENDENT_AMBULATORY_CARE_PROVIDER_SITE_OTHER): Payer: BC Managed Care – HMO

## 2011-01-28 DIAGNOSIS — M545 Low back pain: Secondary | ICD-10-CM

## 2011-01-28 DIAGNOSIS — E875 Hyperkalemia: Secondary | ICD-10-CM

## 2011-01-28 LAB — TESTOSTERONE, FREE, TOTAL, SHBG
Sex Hormone Binding: 30 nmol/L (ref 13–71)
Testosterone, Free: 44 pg/mL — ABNORMAL LOW (ref 47.0–244.0)
Testosterone-% Free: 2 % (ref 1.6–2.9)
Testosterone: 217.64 ng/dL — ABNORMAL LOW (ref 250–890)

## 2011-01-28 NOTE — Telephone Encounter (Signed)
Informed pt he states he would like to try PT. Pt also going to lab this afternoon to recheck potassium

## 2011-01-28 NOTE — Telephone Encounter (Signed)
Potassium 20 meq bid for three days, then once a day. Follow up lab Monday. Order entered

## 2011-01-28 NOTE — Telephone Encounter (Signed)
Lumbar spine films do not reveal significant disk disease or chagnes that would require MRI or surgical intervention. Will he consider PT

## 2011-01-28 NOTE — Assessment & Plan Note (Signed)
Question of acute on chronic low back pain. Exam is difficult to interpret due to chronic pain but he does have some minor radicular findings.   Plan - L-S spine films - if significant abnormality will move to MRI.  Addendum - L-S spine films w/o significant abnormality  Plan - conservative treatment           Consider referral to sports medicine

## 2011-01-28 NOTE — Assessment & Plan Note (Signed)
BP Readings from Last 3 Encounters:  01/27/11 120/80  01/13/11 142/86  11/23/10 136/74   Stable control

## 2011-01-28 NOTE — Telephone Encounter (Signed)
Results ready, how should he take the potassium now?

## 2011-01-28 NOTE — Assessment & Plan Note (Signed)
Increased cramping. Plan - metabolic labs  Addendum - K = 2.7  Plan - replacement therapy called in with follow-up lab.

## 2011-01-29 NOTE — Telephone Encounter (Signed)
Pt advised of PT referral and will expect a call from [facility] with appt info

## 2011-01-29 NOTE — Telephone Encounter (Signed)
Left mess to call office back.   

## 2011-01-29 NOTE — Telephone Encounter (Signed)
Pt advised of MD's medication recommendations in detail. Pt advised os labs as well.

## 2011-01-29 NOTE — Telephone Encounter (Signed)
Order placed with [facility] 

## 2011-02-01 ENCOUNTER — Other Ambulatory Visit: Payer: BC Managed Care – HMO

## 2011-02-01 DIAGNOSIS — E875 Hyperkalemia: Secondary | ICD-10-CM

## 2011-02-01 LAB — COMPREHENSIVE METABOLIC PANEL
AST: 22
Alkaline Phosphatase: 83
BUN: 10
CO2: 28
Chloride: 97
Creatinine, Ser: 1.39
GFR calc Af Amer: >60
GFR calc non Af Amer: 54 — ABNORMAL LOW
Potassium: 3.3 — ABNORMAL LOW
Total Bilirubin: 1

## 2011-02-01 LAB — CBC
HCT: 42.7
HCT: 45.4
Hemoglobin: 14.5
MCHC: 33.9
MCV: 87.4
Platelets: 307
RBC: 4.87
RBC: 5.19
RDW: 14.7 — ABNORMAL HIGH
WBC: 13.4 — ABNORMAL HIGH
WBC: 19.7 — ABNORMAL HIGH

## 2011-02-01 LAB — DIFFERENTIAL
Basophils Absolute: 0
Basophils Absolute: 0.1
Basophils Relative: 0
Basophils Relative: 0
Eosinophils Absolute: 0
Eosinophils Relative: 0
Lymphocytes Relative: 15
Monocytes Relative: 7
Neutro Abs: 11.1 — ABNORMAL HIGH
Neutrophils Relative %: 75

## 2011-02-01 LAB — URINALYSIS, ROUTINE W REFLEX MICROSCOPIC
Bilirubin Urine: NEGATIVE
Glucose, UA: NEGATIVE
Ketones, ur: NEGATIVE
pH: 6

## 2011-02-01 LAB — BASIC METABOLIC PANEL
Calcium: 8.6
GFR calc Af Amer: >60
GFR calc non Af Amer: >60
Potassium: 3.4 — ABNORMAL LOW
Sodium: 138

## 2011-02-01 LAB — LIPASE, BLOOD: Lipase: 19

## 2011-02-07 ENCOUNTER — Encounter: Payer: Self-pay | Admitting: Internal Medicine

## 2011-02-16 ENCOUNTER — Telehealth: Payer: Self-pay

## 2011-02-16 DIAGNOSIS — E876 Hypokalemia: Secondary | ICD-10-CM

## 2011-02-16 NOTE — Telephone Encounter (Signed)
[name] called requesting advisement on whether pt needs to make follow up appt with [facility] regarding recent lab work? Please advise

## 2011-02-16 NOTE — Telephone Encounter (Signed)
Pt informed

## 2011-02-16 NOTE — Telephone Encounter (Signed)
Yes, he should have a serum potassium level - hypokalemia

## 2011-02-25 ENCOUNTER — Other Ambulatory Visit: Payer: Self-pay | Admitting: Internal Medicine

## 2011-02-25 ENCOUNTER — Other Ambulatory Visit (INDEPENDENT_AMBULATORY_CARE_PROVIDER_SITE_OTHER): Payer: BC Managed Care – HMO

## 2011-02-25 DIAGNOSIS — E876 Hypokalemia: Secondary | ICD-10-CM

## 2011-02-25 DIAGNOSIS — R252 Cramp and spasm: Secondary | ICD-10-CM

## 2011-02-25 NOTE — Telephone Encounter (Signed)
Pt had potassium level checked today. Please advise re: refill and directions.

## 2011-02-25 NOTE — Telephone Encounter (Signed)
The pt called and stated he was having severe cramps and needed a stronger medicine called into the pharmacy.   Thanks !

## 2011-02-26 NOTE — Telephone Encounter (Signed)
Rx instructions - take Potassium 20 meq daily, #30, refill 11

## 2011-02-26 NOTE — Telephone Encounter (Signed)
Called patient - still having severe cramps/muscle spasm. K+ 11/8 3.7. He is told to take K 20 meq once a day, flexeril tid, return Monday for recheck of K

## 2011-03-01 NOTE — Telephone Encounter (Signed)
Potassium 20 meq 1 po bid, # 60, refill x 11. Please update MAR

## 2011-07-14 ENCOUNTER — Other Ambulatory Visit: Payer: Self-pay | Admitting: Internal Medicine

## 2011-09-08 ENCOUNTER — Telehealth: Payer: Self-pay | Admitting: Internal Medicine

## 2011-09-08 NOTE — Telephone Encounter (Signed)
The pt called and is experiencing pain in his left shoulder.  He is hoping to be worked into your schedule on the 28th.  [name] and his wife feel more comfortable seeing Dr.Norins, rather than another Dr.  Lynford Humphrey!      [phone]

## 2011-09-10 NOTE — Telephone Encounter (Signed)
Ok to add on 28th

## 2011-10-14 ENCOUNTER — Encounter: Payer: Self-pay | Admitting: Internal Medicine

## 2011-10-14 ENCOUNTER — Ambulatory Visit (INDEPENDENT_AMBULATORY_CARE_PROVIDER_SITE_OTHER): Payer: BC Managed Care – HMO | Admitting: Internal Medicine

## 2011-10-14 VITALS — BP 110/88 | HR 100 | Temp 98.7°F | Resp 16 | Ht 67.0 in | Wt 248.0 lb

## 2011-10-14 DIAGNOSIS — M67919 Unspecified disorder of synovium and tendon, unspecified shoulder: Secondary | ICD-10-CM

## 2011-10-14 DIAGNOSIS — M7552 Bursitis of left shoulder: Secondary | ICD-10-CM

## 2011-10-17 ENCOUNTER — Encounter: Payer: Self-pay | Admitting: Internal Medicine

## 2011-10-17 NOTE — Progress Notes (Signed)
Subjective:    Patient ID: [name] Cruise Sr., male    DOB: [birth year], 56 y.o.   MRN: [id number]  HPI Mr. Wegener presents for on-going neck and shoulder pain. He is having particular pain with movement of the left shoulder. NO click or lock. He has not had recent injury.  He does complain of mild difficulty with slowed stream.  Past Medical History  Diagnosis Date  . Hyperlipidemia   . Hypertension   . Depression   . Allergy   . Anxiety   . ALLERGIC RHINITIS 01/25/2007  . ANXIETY DISORDER, GENERALIZED 01/25/2007  . DEPRESSION 01/25/2007  . ESOPHAGITIS 12/28/2007  . GERD 01/25/2007  . GLUCOSE INTOLERANCE, HX OF 01/25/2007  . HEMORRHOIDS, INTERNAL, WITH BLEEDING 04/26/2008  . HYPERLIPIDEMIA 12/27/2007  . HYPERTENSION, ESSENTIAL NOS 01/25/2007  . Hyperthyroidism 08/11/2010  . HYPERTROPHY PROSTATE W/UR OBST & OTH LUTS 02/27/2010  . 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 LOW BACK PAIN, CHRONIC 11/08/2007  . 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 SCOLIOSIS NEC 01/25/2007  . Sebaceous cyst 08/10/2010  . SMOKER 08/05/2009  . UPPER GASTROINTESTINAL HEMORRHAGE 01/25/2007  . Rectal abscess   . Pancreatitis   . Fatty liver 10/09/2010  . Hypokalemia   . Hiatal hernia    Past Surgical History  Procedure Date  . Excision of thyroid mass   . Abdominal exploration surgery 1988  . Hernia repair     ventral  . Cholecystectomy 05/20/08    Dr. Abbey Chatters  . Excision of scalp lesion 07/2009   Family History  Problem Relation Age of Onset  . Hypertension Father   . Hypertension Mother   . Prostate cancer Other   . Colon cancer Neg Hx    History   Social History  . Marital Status: Married    Spouse Name: N/A    Number of Children: 2  . Years of Education: N/A   Occupational History  . Disabled    Social History Main Topics  . Smoking status: Current Everyday Smoker -- 1.5 packs/day for 30 years    Types: Cigarettes  . Smokeless tobacco: Not on file  . Alcohol Use: No  . Drug Use: No    . Sexually Active: Yes -- Male partner(s)   Other Topics Concern  . Not on file   Social History Narrative   HSG disabled due to schizophrenia - 2. stable long-term marriage with children. cared for his father-in-law- passed away in Sep 06, 2022       Review of Systems System review is negative for any constitutional, cardiac, pulmonary, GI or neuro symptoms or complaints other than as described in the HPI.     Objective:   Physical Exam Filed Vitals:   10/14/11 1147  BP: 110/88  Pulse: 100  Temp: 98.7 F (37.1 C)  Resp: 16   Gen'l- heavyset AA man in no distress Cor - RRR Pulm - normal respirations MSK - decrease ROM due to pain left shoulder  Procedure bursal injection left shoulder  Indication - localized pain Consent - informed verbal consent from patient after explanation of risks of bleeding and infection Prep - injection site identified, prepped with betadine followed by alcohol. Med -   40 Mg depomedrol with 0.5Cc 2% xylocain Injection - bursa/joint space entered easily. Injected without difficulty. Patient tolerated this well. Post-procedure - patient with rapid reduction in discomfort. Bandaid applied. Routine precautions provided including instruction to return for fever, drainage or increased pain        Assessment & Plan:  Bursitis left shoulder - no evidence on exam of internal derangement  Plan - depo injection - performed with patient having rapid relief of symptoms  For recurrent pain may need ortho consult.

## 2011-10-19 ENCOUNTER — Telehealth: Payer: Self-pay

## 2011-10-19 DIAGNOSIS — K439 Ventral hernia without obstruction or gangrene: Secondary | ICD-10-CM

## 2011-10-19 NOTE — Telephone Encounter (Signed)
Pt called requesting status of a referral to surgeon regarding hernia? Please advise

## 2011-10-20 NOTE — Telephone Encounter (Signed)
My misunderstanding - didn't know he wanted to move ahead with surgical consult. [facility] notified

## 2011-10-27 ENCOUNTER — Other Ambulatory Visit: Payer: Self-pay

## 2011-10-27 MED ORDER — AMLODIPINE BESYLATE 10 MG PO TABS: 10.0000 mg | ORAL_TABLET | Freq: Every day | ORAL | Status: DC

## 2011-11-23 ENCOUNTER — Encounter (HOSPITAL_COMMUNITY): Payer: Self-pay | Admitting: Pharmacy Technician

## 2011-11-23 ENCOUNTER — Ambulatory Visit (INDEPENDENT_AMBULATORY_CARE_PROVIDER_SITE_OTHER): Payer: BC Managed Care – HMO | Admitting: Surgery

## 2011-11-23 ENCOUNTER — Encounter (INDEPENDENT_AMBULATORY_CARE_PROVIDER_SITE_OTHER): Payer: Self-pay | Admitting: Surgery

## 2011-11-23 VITALS — BP 138/90 | HR 78 | Resp 18 | Ht 69.0 in | Wt 248.5 lb

## 2011-11-23 DIAGNOSIS — K432 Incisional hernia without obstruction or gangrene: Secondary | ICD-10-CM | POA: Insufficient documentation

## 2011-11-23 NOTE — Progress Notes (Signed)
Patient ID: [name]., male   DOB: 1955/08/05, 56 y.o.   MRN: [id number]  Chief Complaint  Patient presents with  . Incisional Hernia    HPI [name]. is a 56 y.o. male.  This gentleman is referred by Dr. Debby Bud for evaluation of a symptomatic incisional hernia. He reports he has had the hernia for at least a year. It is getting larger and causing to have abdominal discomfort and occasional nausea. The pain is mild to moderate in intensity and focally above the umbilicus. It is not refer any where else. He is moving his bowels well. He does have occasional emesis. HPI  Past Medical History  Diagnosis Date  . Hyperlipidemia   . Hypertension   . Depression   . Allergy   . Anxiety   . ALLERGIC RHINITIS 01/25/2007  . ANXIETY DISORDER, GENERALIZED 01/25/2007  . DEPRESSION 01/25/2007  . ESOPHAGITIS 12/28/2007  . GERD 01/25/2007  . GLUCOSE INTOLERANCE, HX OF 01/25/2007  . HEMORRHOIDS, INTERNAL, WITH BLEEDING 04/26/2008  . HYPERLIPIDEMIA 12/27/2007  . HYPERTENSION, ESSENTIAL NOS 01/25/2007  . Hyperthyroidism 08/11/2010  . HYPERTROPHY PROSTATE W/UR OBST & OTH LUTS 02/27/2010  . 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 LOW BACK PAIN, CHRONIC 11/08/2007  . 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 SCOLIOSIS NEC 01/25/2007  . Sebaceous cyst 08/10/2010  . SMOKER 08/05/2009  . UPPER GASTROINTESTINAL HEMORRHAGE 01/25/2007  . Rectal abscess   . Pancreatitis   . Fatty liver 10/09/2010  . Hypokalemia   . Hiatal hernia     Past Surgical History  Procedure Date  . Excision of thyroid mass   . Abdominal exploration surgery 1988  . Hernia repair     ventral  . Cholecystectomy 05/20/08    Dr. Abbey Chatters  . Excision of scalp lesion 07/2009    Family History  Problem Relation Age of Onset  . Hypertension Father   . Hypertension Mother   . Prostate cancer Other   . Colon cancer Neg Hx     Social History History  Substance Use Topics  . Smoking status: Current Everyday Smoker -- 1.5  packs/day for 30 years    Types: Cigarettes  . Smokeless tobacco: Not on file  . Alcohol Use: No    Allergies  Allergen Reactions  . Celecoxib     REACTION: rectal bleeding  . Iohexol   . Metoclopramide Hcl     Current Outpatient Prescriptions  Medication Sig Dispense Refill  . amLODipine (NORVASC) 10 MG tablet Take 1 tablet (10 mg total) by mouth daily.  30 tablet  11  . cyclobenzaprine (FLEXERIL) 10 MG tablet Take 1 tablet (10 mg total) by mouth 3 (three) times daily as needed.  90 tablet  3  . HYDROcodone-acetaminophen (NORCO) 5-325 MG per tablet Take 2 tablets by mouth every 6 (six) hours as needed for pain.  20 tablet  0  . KLOR-CON M20 20 MEQ tablet TAKE 2 TABLETS NOW THEN 2 TABLETS 5 HOURS LATER. THEN TAKE 2 TABLETS TOMORROW MORNING THEN CALL DR  30 tablet  0  . LORazepam (ATIVAN) 1 MG tablet Take 1 tablet (1 mg total) by mouth at bedtime.  30 tablet  2  . pantoprazole (PROTONIX) 40 MG tablet TAKE 1 TABLET (40 MG TOTAL) BY MOUTH DAILY.  30 tablet  3  . risperiDONE (RISPERDAL) 1 MG tablet Take 1-2 tablets by mouth at bedtime         Review of Systems Review of Systems  Constitutional: Negative for  fever, chills and unexpected weight change.  HENT: Negative for hearing loss, congestion, sore throat, trouble swallowing and voice change.   Eyes: Negative for visual disturbance.  Respiratory: Negative for cough and wheezing.   Cardiovascular: Negative for chest pain, palpitations and leg swelling.  Gastrointestinal: Positive for nausea, vomiting, abdominal pain, constipation and blood in stool. Negative for diarrhea, abdominal distention and rectal pain.  Genitourinary: Negative for hematuria and difficulty urinating.  Musculoskeletal: Negative for arthralgias.  Skin: Negative for rash and wound.  Neurological: Negative for seizures, syncope, weakness and headaches.  Hematological: Negative for adenopathy. Does not bruise/bleed easily.  Psychiatric/Behavioral: Positive for  confusion and agitation.    Blood pressure 138/90, pulse 78, resp. rate 18, height 5\' 9"  (1.753 m), weight 248 lb 8 oz (112.719 kg).  Physical Exam Physical Exam  Constitutional: He is oriented to person, place, and time. He appears well-developed and well-nourished. No distress.  HENT:  Head: Normocephalic and atraumatic.  Right Ear: External ear normal.  Left Ear: External ear normal.  Nose: Nose normal.  Mouth/Throat: Oropharynx is clear and moist.  Eyes: Conjunctivae and EOM are normal. Pupils are equal, round, and reactive to light. Right eye exhibits no discharge. Left eye exhibits no discharge. No scleral icterus.  Neck: Normal range of motion. Neck supple. No tracheal deviation present. No thyromegaly present.  Cardiovascular: Normal rate, regular rhythm, normal heart sounds and intact distal pulses.   No murmur heard. Pulmonary/Chest: Effort normal and breath sounds normal. No respiratory distress. He has no wheezes. He has no rales.  Abdominal: Soft. Bowel sounds are normal. He exhibits no distension. There is no tenderness.       There is a well-healed midline incision. There is a moderate-sized, reducible incisional hernia at the upper aspect of the incision  Musculoskeletal: Normal range of motion. He exhibits no edema and no tenderness.  Lymphadenopathy:    He has no cervical adenopathy.  Neurological: He is oriented to person, place, and time.  Skin: Skin is warm and dry. He is not diaphoretic. No erythema. No pallor.    Data Reviewed   Assessment    Incisional hernia    Plan    Laparoscopic incisional hernia repair with mesh was recommended. I discussed this with the patient in detail. I discussed the risks of surgery which includes but is not limited to bleeding, infection, injury to the intestines, need to convert to an open procedure, recurrence, postop recovery, etc. He understands and wishes to proceed. Surgery will be scheduled.       [name]  A 11/23/2011, 9:22 AM

## 2011-11-25 NOTE — Pre-Procedure Instructions (Signed)
20 [name] Caggiano Sr.  11/25/2011   Your procedure is scheduled on:  Wednesday, August 14th.  Report to [facility] at 9:00AM.  Call this number if you have problems the morning of surgery: [phone]   Remember:   Do not eat food or anything to drink:After Midnight.      Take these medicines the morning of surgery with A SIP OF WATER: Amolodipine (Norvasc) and Rantidine (Zantac).  Stop taking any Aspirin, NSAIDs (Ibuprophen), Coumadin, Plavix, Coumadin and herbal medications.    Do not wear jewelry, make-up or nail polish.  Do not wear lotions, powders, or perfumes. You may wear deodorant.  Do not shave 48 hours prior to surgery. Men may shave face and neck.  Do not bring valuables to the hospital.  Contacts, dentures or bridgework may not be worn into surgery.  Leave suitcase in the car. After surgery it may be brought to your room.  For patients admitted to the hospital, checkout time is 11:00 AM the day of discharge.   Patients discharged the day of surgery will not be allowed to drive home.  Name and phone number of your driver: [name] [phone]  Special Instructions: CHG Shower Use Special Wash: 1/2 bottle night before surgery and 1/2 bottle morning of surgery.   Please read over the following fact sheets that you were given: Pain Booklet, Coughing and Deep Breathing and Surgical Site Infection Prevention

## 2011-11-26 ENCOUNTER — Encounter (HOSPITAL_COMMUNITY)
Admission: RE | Admit: 2011-11-26 | Discharge: 2011-11-26 | Disposition: A | Discharge status: Home / Self Care | Payer: BC Managed Care – HMO | Source: Ambulatory Visit | Attending: Surgery | Admitting: Surgery

## 2011-11-26 ENCOUNTER — Encounter (HOSPITAL_COMMUNITY): Payer: Self-pay

## 2011-11-26 HISTORY — DX: Unspecified osteoarthritis, unspecified site: M19.90

## 2011-11-26 HISTORY — DX: Cardiac murmur, unspecified: R01.1

## 2011-11-26 LAB — BASIC METABOLIC PANEL
CO2: 32 mEq/L (ref 19–32)
Calcium: 9.3 mg/dL (ref 8.4–10.5)
Glucose, Bld: 109 mg/dL — ABNORMAL HIGH (ref 70–99)
Sodium: 143 mEq/L (ref 135–145)

## 2011-11-26 LAB — CBC
HCT: 42.4 % (ref 39.0–52.0)
Hemoglobin: 14.6 g/dL (ref 13.0–17.0)
MCH: 29 pg (ref 26.0–34.0)
MCV: 84.1 fL (ref 78.0–100.0)
Platelets: 290 10*3/uL (ref 150–400)
RBC: 5.04 MIL/uL (ref 4.22–5.81)

## 2011-11-30 MED ORDER — CEFAZOLIN SODIUM-DEXTROSE 2-3 GM-% IV SOLR
2.0000 g | INTRAVENOUS | Status: AC
  Administered 2011-12-01: 2 g via INTRAVENOUS
  Filled 2011-11-30: qty 50

## 2011-11-30 NOTE — H&P (Signed)
Patient ID: [name] Cruise Sr., male DOB: 05/26/1955, 56 y.o. MRN: [id number]  Chief Complaint   Patient presents with   .  Incisional Hernia    HPI  [name] [name]. is [name] 56 y.o. male. This gentleman is referred by Dr. Debby Bud for evaluation of [name] symptomatic incisional hernia. He reports he has had the hernia for at least [name] year. It is getting larger and causing to have abdominal discomfort and occasional nausea. The pain is mild to moderate in intensity and focally above the umbilicus. It is not refer any where else. He is moving his bowels well. He does have occasional emesis.  HPI  Past Medical History   Diagnosis  Date   .  Hyperlipidemia    .  Hypertension    .  Depression    .  Allergy    .  Anxiety    .  ALLERGIC RHINITIS  01/25/2007   .  ANXIETY DISORDER, GENERALIZED  01/25/2007   .  DEPRESSION  01/25/2007   .  ESOPHAGITIS  12/28/2007   .  GERD  01/25/2007   .  GLUCOSE INTOLERANCE, HX OF  01/25/2007   .  HEMORRHOIDS, INTERNAL, WITH BLEEDING  04/26/2008   .  HYPERLIPIDEMIA  12/27/2007   .  HYPERTENSION, ESSENTIAL NOS  01/25/2007   .  Hyperthyroidism  08/11/2010   .  HYPERTROPHY PROSTATE W/UR OBST & OTH LUTS  02/27/2010   .  Impotence of organic origin  08/05/2009   .  LOW BACK PAIN, CHRONIC  11/08/2007   .  SCHIZOPHRENIA NEC, CHRONIC  01/25/2007   .  SCOLIOSIS NEC  01/25/2007   .  Sebaceous cyst  08/10/2010   .  SMOKER  08/05/2009   .  UPPER GASTROINTESTINAL HEMORRHAGE  01/25/2007   .  Rectal abscess    .  Pancreatitis    .  Fatty liver  10/09/2010   .  Hypokalemia    .  Hiatal hernia     Past Surgical History   Procedure  Date   .  Excision of thyroid mass    .  Abdominal exploration surgery  1988   .  Hernia repair      ventral   .  Cholecystectomy  05/20/08     Dr. Abbey Chatters   .  Excision of scalp lesion  07/2009    Family History   Problem  Relation  Age of Onset   .  Hypertension  Father    .  Hypertension  Mother    .  Prostate cancer  Other    .  Colon cancer  Neg Hx      Social History  History   Substance Use Topics   .  Smoking status:  Current Everyday Smoker -- 1.5 packs/day for 30 years     Types:  Cigarettes   .  Smokeless tobacco:  Not on file   .  Alcohol Use:  No    Allergies   Allergen  Reactions   .  Celecoxib      REACTION: rectal bleeding   .  Iohexol    .  Metoclopramide Hcl     Current Outpatient Prescriptions   Medication  Sig  Dispense  Refill   .  amLODipine (NORVASC) 10 MG tablet  Take 1 tablet (10 mg total) by mouth daily.  30 tablet  11   .  cyclobenzaprine (FLEXERIL) 10 MG tablet  Take 1 tablet (10 mg total) by mouth 3 (three)  times daily as needed.  90 tablet  3   .  HYDROcodone-acetaminophen (NORCO) 5-325 MG per tablet  Take 2 tablets by mouth every 6 (six) hours as needed for pain.  20 tablet  0   .  KLOR-CON M20 20 MEQ tablet  TAKE 2 TABLETS NOW THEN 2 TABLETS 5 HOURS LATER. THEN TAKE 2 TABLETS TOMORROW MORNING THEN CALL DR  30 tablet  0   .  LORazepam (ATIVAN) 1 MG tablet  Take 1 tablet (1 mg total) by mouth at bedtime.  30 tablet  2   .  pantoprazole (PROTONIX) 40 MG tablet  TAKE 1 TABLET (40 MG TOTAL) BY MOUTH DAILY.  30 tablet  3   .  risperiDONE (RISPERDAL) 1 MG tablet  Take 1-2 tablets by mouth at bedtime      Review of Systems  Review of Systems  Constitutional: Negative for fever, chills and unexpected weight change.  HENT: Negative for hearing loss, congestion, sore throat, trouble swallowing and voice change.  Eyes: Negative for visual disturbance.  Respiratory: Negative for cough and wheezing.  Cardiovascular: Negative for chest pain, palpitations and leg swelling.  Gastrointestinal: Positive for nausea, vomiting, abdominal pain, constipation and blood in stool. Negative for diarrhea, abdominal distention and rectal pain.  Genitourinary: Negative for hematuria and difficulty urinating.  Musculoskeletal: Negative for arthralgias.  Skin: Negative for rash and wound.  Neurological: Negative for seizures,  syncope, weakness and headaches.  Hematological: Negative for adenopathy. Does not bruise/bleed easily.  Psychiatric/Behavioral: Positive for confusion and agitation.   Blood pressure 138/90, pulse 78, resp. rate 18, height 5\' 9"  (1.753 m), weight 248 lb 8 oz (112.719 kg).  Physical Exam  Physical Exam  Constitutional: He is oriented to person, place, and time. He appears well-developed and well-nourished. No distress.  HENT:  Head: Normocephalic and atraumatic.  Right Ear: External ear normal.  Left Ear: External ear normal.  Nose: Nose normal.  Mouth/Throat: Oropharynx is clear and moist.  Eyes: Conjunctivae and EOM are normal. Pupils are equal, round, and reactive to light. Right eye exhibits no discharge. Left eye exhibits no discharge. No scleral icterus.  Neck: Normal range of motion. Neck supple. No tracheal deviation present. No thyromegaly present.  Cardiovascular: Normal rate, regular rhythm, normal heart sounds and intact distal pulses.  No murmur heard.  Pulmonary/Chest: Effort normal and breath sounds normal. No respiratory distress. He has no wheezes. He has no rales.  Abdominal: Soft. Bowel sounds are normal. He exhibits no distension. There is no tenderness.  There is [name] well-healed midline incision. There is [name] moderate-sized, reducible incisional hernia at the upper aspect of the incision  Musculoskeletal: Normal range of motion. He exhibits no edema and no tenderness.  Lymphadenopathy:  He has no cervical adenopathy.  Neurological: He is oriented to person, place, and time.  Skin: Skin is warm and dry. He is not diaphoretic. No erythema. No pallor.   Data Reviewed  Assessment   Incisional hernia   Plan   Laparoscopic incisional hernia repair with mesh was recommended. I discussed this with the patient in detail. I discussed the risks of surgery which includes but is not limited to bleeding, infection, injury to the intestines, need to convert to an open procedure,  recurrence, postop recovery, etc. He understands and wishes to proceed. Surgery will be scheduled.   [name] [name]

## 2011-12-01 ENCOUNTER — Ambulatory Visit (HOSPITAL_COMMUNITY): Payer: BC Managed Care – HMO | Admitting: *Deleted

## 2011-12-01 ENCOUNTER — Encounter (HOSPITAL_COMMUNITY)
Admission: RE | Disposition: A | Discharge status: Home / Self Care | Payer: Self-pay | Source: Ambulatory Visit | Attending: Surgery

## 2011-12-01 ENCOUNTER — Encounter (HOSPITAL_COMMUNITY): Payer: Self-pay | Admitting: *Deleted

## 2011-12-01 ENCOUNTER — Observation Stay (HOSPITAL_COMMUNITY)
Admission: RE | Admit: 2011-12-01 | Discharge: 2011-12-02 | DRG: 159 | Disposition: A | Discharge status: Home / Self Care | Payer: BC Managed Care – HMO | Source: Ambulatory Visit | Attending: Surgery | Admitting: Surgery

## 2011-12-01 ENCOUNTER — Encounter (HOSPITAL_COMMUNITY): Payer: Self-pay

## 2011-12-01 DIAGNOSIS — F17200 Nicotine dependence, unspecified, uncomplicated: Secondary | ICD-10-CM | POA: Diagnosis present

## 2011-12-01 DIAGNOSIS — I10 Essential (primary) hypertension: Secondary | ICD-10-CM | POA: Diagnosis present

## 2011-12-01 DIAGNOSIS — F411 Generalized anxiety disorder: Secondary | ICD-10-CM | POA: Diagnosis present

## 2011-12-01 DIAGNOSIS — Z01812 Encounter for preprocedural laboratory examination: Secondary | ICD-10-CM

## 2011-12-01 DIAGNOSIS — E0590 Thyrotoxicosis, unspecified without thyrotoxic crisis or storm: Secondary | ICD-10-CM | POA: Diagnosis present

## 2011-12-01 DIAGNOSIS — Z79899 Other long term (current) drug therapy: Secondary | ICD-10-CM

## 2011-12-01 DIAGNOSIS — F3289 Other specified depressive episodes: Secondary | ICD-10-CM | POA: Diagnosis present

## 2011-12-01 DIAGNOSIS — E785 Hyperlipidemia, unspecified: Secondary | ICD-10-CM | POA: Diagnosis present

## 2011-12-01 DIAGNOSIS — F205 Residual schizophrenia: Secondary | ICD-10-CM | POA: Diagnosis present

## 2011-12-01 DIAGNOSIS — N400 Enlarged prostate without lower urinary tract symptoms: Secondary | ICD-10-CM | POA: Diagnosis present

## 2011-12-01 DIAGNOSIS — E669 Obesity, unspecified: Secondary | ICD-10-CM | POA: Diagnosis present

## 2011-12-01 DIAGNOSIS — K432 Incisional hernia without obstruction or gangrene: Secondary | ICD-10-CM

## 2011-12-01 DIAGNOSIS — K219 Gastro-esophageal reflux disease without esophagitis: Secondary | ICD-10-CM | POA: Diagnosis present

## 2011-12-01 DIAGNOSIS — Z01818 Encounter for other preprocedural examination: Secondary | ICD-10-CM

## 2011-12-01 DIAGNOSIS — Z01810 Encounter for preprocedural cardiovascular examination: Secondary | ICD-10-CM

## 2011-12-01 DIAGNOSIS — F329 Major depressive disorder, single episode, unspecified: Secondary | ICD-10-CM | POA: Diagnosis present

## 2011-12-01 HISTORY — PX: INCISIONAL HERNIA REPAIR: SHX193

## 2011-12-01 LAB — GLUCOSE, CAPILLARY: Glucose-Capillary: 98 mg/dL (ref 70–99)

## 2011-12-01 SURGERY — REPAIR, HERNIA, INCISIONAL, LAPAROSCOPIC
Anesthesia: General | Wound class: Clean

## 2011-12-01 MED ORDER — SODIUM CHLORIDE 0.9 % IR SOLN
Status: DC | PRN
  Administered 2011-12-01: 1000 mL

## 2011-12-01 MED ORDER — SUFENTANIL CITRATE 50 MCG/ML IV SOLN
INTRAVENOUS | Status: DC | PRN
  Administered 2011-12-01 (×3): 10 ug via INTRAVENOUS

## 2011-12-01 MED ORDER — MIDAZOLAM HCL 5 MG/5ML IJ SOLN
INTRAMUSCULAR | Status: DC | PRN
  Administered 2011-12-01: 2 mg via INTRAVENOUS

## 2011-12-01 MED ORDER — PROPOFOL 10 MG/ML IV BOLUS
INTRAVENOUS | Status: DC | PRN
  Administered 2011-12-01: 170 mg via INTRAVENOUS

## 2011-12-01 MED ORDER — HYDROMORPHONE HCL PF 1 MG/ML IJ SOLN
1.0000 mg | INTRAMUSCULAR | Status: DC | PRN
  Administered 2011-12-01 – 2011-12-02 (×4): 1 mg via INTRAVENOUS
  Filled 2011-12-01 (×3): qty 1

## 2011-12-01 MED ORDER — HYDROMORPHONE HCL PF 1 MG/ML IJ SOLN
0.2500 mg | INTRAMUSCULAR | Status: DC | PRN
  Administered 2011-12-01 (×2): 0.5 mg via INTRAVENOUS

## 2011-12-01 MED ORDER — ONDANSETRON HCL 4 MG/2ML IJ SOLN: 4.0000 mg | Freq: Four times a day (QID) | INTRAMUSCULAR | Status: DC | PRN

## 2011-12-01 MED ORDER — MIDAZOLAM HCL 2 MG/2ML IJ SOLN: 1.0000 mg | INTRAMUSCULAR | Status: DC | PRN

## 2011-12-01 MED ORDER — ACETAMINOPHEN 325 MG PO TABS: 650.0000 mg | ORAL_TABLET | ORAL | Status: DC | PRN

## 2011-12-01 MED ORDER — AMLODIPINE BESYLATE 10 MG PO TABS
10.0000 mg | ORAL_TABLET | Freq: Every day | ORAL | Status: DC
  Filled 2011-12-01: qty 1

## 2011-12-01 MED ORDER — HYDROMORPHONE HCL PF 1 MG/ML IJ SOLN
INTRAMUSCULAR | Status: AC
  Filled 2011-12-01: qty 1

## 2011-12-01 MED ORDER — BUPIVACAINE HCL (PF) 0.25 % IJ SOLN
INTRAMUSCULAR | Status: AC
  Filled 2011-12-01: qty 30

## 2011-12-01 MED ORDER — ROCURONIUM BROMIDE 100 MG/10ML IV SOLN
INTRAVENOUS | Status: DC | PRN
  Administered 2011-12-01: 50 mg via INTRAVENOUS

## 2011-12-01 MED ORDER — OXYCODONE-ACETAMINOPHEN 5-325 MG PO TABS: 1.0000 | ORAL_TABLET | ORAL | Status: DC | PRN

## 2011-12-01 MED ORDER — ENOXAPARIN SODIUM 40 MG/0.4ML ~~LOC~~ SOLN
40.0000 mg | SUBCUTANEOUS | Status: DC
  Filled 2011-12-01: qty 0.4

## 2011-12-01 MED ORDER — LIDOCAINE HCL (CARDIAC) 20 MG/ML IV SOLN
INTRAVENOUS | Status: DC | PRN
  Administered 2011-12-01: 20 mg via INTRAVENOUS

## 2011-12-01 MED ORDER — FAMOTIDINE 20 MG PO TABS
20.0000 mg | ORAL_TABLET | Freq: Two times a day (BID) | ORAL | Status: DC
  Administered 2011-12-01: 20 mg via ORAL
  Filled 2011-12-01 (×4): qty 1

## 2011-12-01 MED ORDER — FENTANYL CITRATE 0.05 MG/ML IJ SOLN: 50.0000 ug | INTRAMUSCULAR | Status: DC | PRN

## 2011-12-01 MED ORDER — BUPIVACAINE-EPINEPHRINE 0.25% -1:200000 IJ SOLN
INTRAMUSCULAR | Status: DC | PRN
  Administered 2011-12-01: 20 mL

## 2011-12-01 MED ORDER — NEOSTIGMINE METHYLSULFATE 1 MG/ML IJ SOLN
INTRAMUSCULAR | Status: DC | PRN
  Administered 2011-12-01: 5 mg via INTRAVENOUS

## 2011-12-01 MED ORDER — LACTATED RINGERS IV SOLN
INTRAVENOUS | Status: DC
  Administered 2011-12-01 (×3): via INTRAVENOUS

## 2011-12-01 MED ORDER — THIOTHIXENE 5 MG PO CAPS
10.0000 mg | ORAL_CAPSULE | Freq: Every day | ORAL | Status: DC
  Administered 2011-12-01: 15 mg via ORAL
  Filled 2011-12-01 (×2): qty 3

## 2011-12-01 MED ORDER — HYDROMORPHONE HCL PF 1 MG/ML IJ SOLN
INTRAMUSCULAR | Status: AC
  Administered 2011-12-01: 1 mg
  Filled 2011-12-01: qty 1

## 2011-12-01 MED ORDER — ONDANSETRON HCL 4 MG PO TABS: 4.0000 mg | ORAL_TABLET | Freq: Four times a day (QID) | ORAL | Status: DC | PRN

## 2011-12-01 MED ORDER — POTASSIUM CHLORIDE IN NACL 20-0.9 MEQ/L-% IV SOLN
INTRAVENOUS | Status: DC
  Administered 2011-12-01: 19:00:00 via INTRAVENOUS
  Filled 2011-12-01 (×3): qty 1000

## 2011-12-01 MED ORDER — PROMETHAZINE HCL 25 MG/ML IJ SOLN: 6.2500 mg | INTRAMUSCULAR | Status: DC | PRN

## 2011-12-01 MED ORDER — GLYCOPYRROLATE 0.2 MG/ML IJ SOLN
INTRAMUSCULAR | Status: DC | PRN
  Administered 2011-12-01: .6 mg via INTRAVENOUS

## 2011-12-01 SURGICAL SUPPLY — 38 items
APPLIER CLIP 5 13 M/L LIGAMAX5 (MISCELLANEOUS)
APPLIER CLIP ROT 10 11.4 M/L (STAPLE)
BANDAGE ADHESIVE 1X3 (GAUZE/BANDAGES/DRESSINGS) ×2 IMPLANT
BENZOIN TINCTURE PRP APPL 2/3 (GAUZE/BANDAGES/DRESSINGS) ×2 IMPLANT
CANISTER SUCTION 2500CC (MISCELLANEOUS) IMPLANT
CHLORAPREP W/TINT 26ML (MISCELLANEOUS) ×2 IMPLANT
CLIP APPLIE 5 13 M/L LIGAMAX5 (MISCELLANEOUS) IMPLANT
CLIP APPLIE ROT 10 11.4 M/L (STAPLE) IMPLANT
CLOTH BEACON ORANGE TIMEOUT ST (SAFETY) ×2 IMPLANT
COVER SURGICAL LIGHT HANDLE (MISCELLANEOUS) ×2 IMPLANT
DECANTER SPIKE VIAL GLASS SM (MISCELLANEOUS) ×2 IMPLANT
DEVICE SECURE STRAP 25 ABSORB (INSTRUMENTS) ×4 IMPLANT
DEVICE TROCAR PUNCTURE CLOSURE (ENDOMECHANICALS) ×2 IMPLANT
ELECT REM PT RETURN 9FT ADLT (ELECTROSURGICAL) ×2
ELECTRODE REM PT RTRN 9FT ADLT (ELECTROSURGICAL) ×1 IMPLANT
GLOVE SURG SIGNA 7.5 PF LTX (GLOVE) ×2 IMPLANT
GOWN PREVENTION PLUS XLARGE (GOWN DISPOSABLE) ×2 IMPLANT
GOWN STRL NON-REIN LRG LVL3 (GOWN DISPOSABLE) ×6 IMPLANT
KIT BASIN OR (CUSTOM PROCEDURE TRAY) ×2 IMPLANT
KIT ROOM TURNOVER OR (KITS) ×2 IMPLANT
MESH PHYSIO OVAL 10X15CM (Mesh General) ×2 IMPLANT
NEEDLE SPNL 22GX3.5 QUINCKE BK (NEEDLE) ×2 IMPLANT
NS IRRIG 1000ML POUR BTL (IV SOLUTION) ×2 IMPLANT
PAD ARMBOARD 7.5X6 YLW CONV (MISCELLANEOUS) ×2 IMPLANT
PEN SKIN MARKING BROAD (MISCELLANEOUS) ×2 IMPLANT
SCALPEL HARMONIC ACE (MISCELLANEOUS) ×2 IMPLANT
SCISSORS LAP 5X35 DISP (ENDOMECHANICALS) IMPLANT
SET IRRIG TUBING LAPAROSCOPIC (IRRIGATION / IRRIGATOR) IMPLANT
SLEEVE ENDOPATH XCEL 5M (ENDOMECHANICALS) ×2 IMPLANT
STRIP CLOSURE SKIN 1/2X4 (GAUZE/BANDAGES/DRESSINGS) ×2 IMPLANT
SUT MON AB 4-0 PC3 18 (SUTURE) ×2 IMPLANT
SUT NOVA NAB GS-21 0 18 T12 DT (SUTURE) ×2 IMPLANT
TOWEL OR 17X24 6PK STRL BLUE (TOWEL DISPOSABLE) ×2 IMPLANT
TOWEL OR 17X26 10 PK STRL BLUE (TOWEL DISPOSABLE) ×2 IMPLANT
TRAY FOLEY CATH 14FR (SET/KITS/TRAYS/PACK) IMPLANT
TRAY LAPAROSCOPIC (CUSTOM PROCEDURE TRAY) ×2 IMPLANT
TROCAR XCEL NON-BLD 11X100MML (ENDOMECHANICALS) ×2 IMPLANT
TROCAR XCEL NON-BLD 5MMX100MML (ENDOMECHANICALS) ×4 IMPLANT

## 2011-12-01 NOTE — Anesthesia Preprocedure Evaluation (Addendum)
Anesthesia Evaluation  Patient identified by MRN, date of birth, ID band Patient awake    Reviewed: Allergy & Precautions, H&P , NPO status , Patient's Chart, lab work & pertinent test results, reviewed documented beta blocker date and time   Airway Mallampati: II TM Distance: >3 FB Neck ROM: Full    Dental  (+) Teeth Intact, Poor Dentition and Dental Advisory Given   Pulmonary    Pulmonary exam normal       Cardiovascular hypertension, Pt. on medications     Neuro/Psych Anxiety Depression  Neuromuscular disease    GI/Hepatic hiatal hernia, GERD-  Medicated and Controlled,  Endo/Other  Hyperthyroidism   Renal/GU      Musculoskeletal   Abdominal (+) + obese,   Peds  Hematology   Anesthesia Other Findings   Reproductive/Obstetrics                          Anesthesia Physical Anesthesia Plan  ASA: II  Anesthesia Plan: General   Post-op Pain Management:    Induction: Intravenous  Airway Management Planned: Oral ETT  Additional Equipment:   Intra-op Plan:   Post-operative Plan: Extubation in OR  Informed Consent: I have reviewed the patients History and Physical, chart, labs and discussed the procedure including the risks, benefits and alternatives for the proposed anesthesia with the patient or authorized representative who has indicated his/her understanding and acceptance.   Dental advisory given  Plan Discussed with: CRNA, Surgeon and Anesthesiologist  Anesthesia Plan Comments:        Anesthesia Quick Evaluation

## 2011-12-01 NOTE — Progress Notes (Signed)
Report given to maria rn as caregiver 

## 2011-12-01 NOTE — Transfer of Care (Signed)
Immediate Anesthesia Transfer of Care Note  Patient: [name] Moffitt Sr.  Procedure(s) Performed: Procedure(s) (LRB): LAPAROSCOPIC INCISIONAL HERNIA (N/A) INSERTION OF MESH (N/A)  Patient Location: PACU  Anesthesia Type: General  Level of Consciousness: awake  Airway & Oxygen Therapy: Patient Spontanous Breathing and Patient connected to face mask oxygen  Post-op Assessment: Report given to PACU RN and Post -op Vital signs reviewed and stable  Post vital signs: Reviewed and stable  Complications: No apparent anesthesia complications

## 2011-12-01 NOTE — Preoperative (Signed)
Beta Blockers   Reason not to administer Beta Blockers:Not Applicable 

## 2011-12-01 NOTE — Interval H&P Note (Signed)
History and Physical Interval Note:  No change in h and p  12/01/2011 10:32 AM  [name] Christell Constant Sr.  has presented today for surgery, with the diagnosis of incisional hernia  The various methods of treatment have been discussed with the patient and family. After consideration of risks, benefits and other options for treatment, the patient has consented to  Procedure(s) (LRB): LAPAROSCOPIC INCISIONAL HERNIA (N/A) INSERTION OF MESH (N/A) as a surgical intervention .  The patient's history has been reviewed, patient examined, no change in status, stable for surgery.  I have reviewed the patient's chart and labs.  Questions were answered to the patient's satisfaction.     Anael Rosch A

## 2011-12-01 NOTE — Anesthesia Postprocedure Evaluation (Signed)
  Anesthesia Post-op Note  Patient: [name] Cruise Sr.  Procedure(s) Performed: Procedure(s) (LRB): LAPAROSCOPIC INCISIONAL HERNIA (N/A) INSERTION OF MESH (N/A)  Patient Location: PACU  Anesthesia Type: General  Level of Consciousness: awake, alert  and oriented  Airway and Oxygen Therapy: Patient Spontanous Breathing and Patient connected to nasal cannula oxygen  Post-op Pain: mild  Post-op Assessment: Post-op Vital signs reviewed  Post-op Vital Signs: Reviewed  Complications: No apparent anesthesia complications

## 2011-12-01 NOTE — Op Note (Signed)
LAPAROSCOPIC INCISIONAL HERNIA, INSERTION OF MESH  Procedure Note  [name] Moors Sr. 12/01/2011   Pre-op Diagnosis: incisional hernia     Post-op Diagnosis: same  Procedure(s): LAPAROSCOPIC INCISIONAL HERNIA Repair with INSERTION OF MESH (10cm x 15 cm Physio)  Surgeon(s): Shelly Rubenstein, MD  Anesthesia: General  Staff:  Doy Mince, RN - Circulator Sudie Bailey, RN - Scrub Person Janeece Agee Pingue, CST - Scrub Person Caprice Kluver, RN - Scrub Person Doy Mince, RN - Relief Scrub Ursula Beath, RN - Relief Circulator Sudie Bailey, RN - Circulator  Estimated Blood Loss: Minimal               Procedure: The patient was brought to the operating room and identified as the correct patient. He was placed on the operating room table and general anesthesia was induced. His abdomen was prepped and draped in the usual sterile fashion. I made [name] small incision in the left upper quadrant with [name] scalpel. I then used the 5 mm Optiview port and camera to slowly go through all layers of the abdominal wall under direct vision again entrance in the peritoneal cavity under direct vision. Insufflation of the abdomen was then begun. I inserted the camera and evaluated the entrance site and saw no evidence of bowel injury. I then placed [name] 12 mm port in the patient's left mid quadrant and another 5 mm port this left lower quadrant all under direct vision. The fascial defect was at the epigastric area. I took down the omentum from this with the upper stomach scissors and electrocautery. The patient had small bowel fixed to the lower midline without evidence of hernia. I took down some adhesions just proximal to this and evaluate the small bowel. As it was far from the hernia and I was worried that trying to free it up from the abdominal wall would leave for the possibility of an enterotomy, I decided to leave the bowel in place. I measured the fascia defect. [name] 10 cm x  15 cm piece of physio-mesh was brought to the field. I placed 4 separate 0 Novafil sutures in the corner of the mesh. The mesh was then rolled up and placed through the 11 mm trocar. I unrolled the mesh and the abdominal cavity under direct vision. I then made 4 separate skin incisions and used the suture passer to ball the sutures up through the abdominal wall. The sutures were tied in place fixating the mesh to the abdominal wall. Good coverage of the fascia appeared to be achieved. I then tacked the mesh in circumferentially with absorbable tacks. I again evaluated the small bowel which appeared free of injury. Hemostasis appeared to be achieved. All ports were then removed under direct vision and the abdomen was deflated. All incisions were then anesthetized with Marcaine and closed with 4-0 Monocryl subcuticular sutures. Steri-Strips and Band-Aids were then applied. The patient tolerated the procedure well. All the counts were correct at the end of the procedure. The patient was then extubated in the operating room and taken in [name] stable condition to the recovery room.          [name]   Date: 12/01/2011  Time: 12:30 PM

## 2011-12-02 ENCOUNTER — Encounter (HOSPITAL_COMMUNITY): Payer: Self-pay | Admitting: Surgery

## 2011-12-02 MED ORDER — HYDROCODONE-ACETAMINOPHEN 5-325 MG PO TABS: 1.0000 | ORAL_TABLET | ORAL | Status: AC | PRN

## 2011-12-02 NOTE — Progress Notes (Signed)
1 Day Post-Op  Subjective: POD#1 Complains of soreness, but otherwise ok Wants to go home  Objective: Vital signs in last 24 hours: Temp:  [97.1 F (36.2 C)-99 F (37.2 C)] 97.1 F (36.2 C) (08/15 0521) Pulse Rate:  [69-90] 73  (08/15 0521) Resp:  [18-22] 18  (08/15 0521) BP: (102-167)/(61-107) 141/96 mmHg (08/15 0521) SpO2:  [92 %-100 %] 97 % (08/15 0521) Weight:  [251 lb 3.2 oz (113.944 kg)] 251 lb 3.2 oz (113.944 kg) (08/14 1444) Last BM Date: 11/30/11  Intake/Output from previous day: 08/14 0701 - 08/15 0700 In: 2107.5 [P.O.:600; I.V.:1507.5] Out: 1660 [Urine:1650; Blood:10] Intake/Output this shift: Total I/O In: 312.5 [I.V.:312.5] Out: 1050 [Urine:1050]  Abdomen soft, appropriately tender, dressing dry  Lab Results:  No results found for this basename: WBC:2,HGB:2,HCT:2,PLT:2 in the last 72 hours BMET No results found for this basename: NA:2,K:2,CL:2,CO2:2,GLUCOSE:2,BUN:2,CREATININE:2,CALCIUM:2 in the last 72 hours PT/INR No results found for this basename: LABPROT:2,INR:2 in the last 72 hours ABG No results found for this basename: PHART:2,PCO2:2,PO2:2,HCO3:2 in the last 72 hours  Studies/Results: No results found.  Anti-infectives: Anti-infectives     Start     Dose/Rate Route Frequency Ordered Stop   11/30/11 1425   ceFAZolin (ANCEF) IVPB 2 g/50 mL premix        2 g 100 mL/hr over 30 Minutes Intravenous 60 min pre-op 11/30/11 1425 12/01/11 1130          Assessment/Plan: s/p Procedure(s) (LRB): LAPAROSCOPIC INCISIONAL HERNIA (N/A) INSERTION OF MESH (N/A)  Discharge home   LOS: 1 day    Jakaylah Schlafer A 12/02/2011

## 2011-12-02 NOTE — Discharge Summary (Signed)
Physician Discharge Summary  Patient ID: [name] Cruise Sr. MRN: [id number] DOB/AGE: 12/26/1955 56 y.o.  Admit date: 12/01/2011 Discharge date: 12/02/2011  Admission Diagnoses: incisional hernia  Discharge Diagnoses: incisional hernia Active Problems:  * No active hospital problems. *    Discharged Condition: good  Hospital Course: admitted for elective surgery.  Did well.  Discharged home POD#1  Consults: None  Significant Diagnostic Studies:   Treatments: surgery: laparoscopic incisional hernia repair with mesh  Discharge Exam: Blood pressure 141/96, pulse 73, temperature 97.1 F (36.2 C), temperature source Oral, resp. rate 18, height 5\' 8"  (1.727 m), weight 251 lb 3.2 oz (113.944 kg), SpO2 97.00%. General appearance: alert and no distress Incision/Wound: abdomen soft, dressings dry  Disposition: 07-Left Against Medical Advice  Discharge Orders    Future Appointments: Provider: Department: Dept Phone: Center:   12/14/2011 9:40 AM Shelly Rubenstein, MD [facility] [phone] None     Future Orders Please Complete By Expires   Discharge patient        Medication List  As of 12/02/2011  6:33 AM   TAKE these medications         amLODipine 10 MG tablet   Commonly known as: NORVASC   Take 10 mg by mouth daily.      HYDROcodone-acetaminophen 5-325 MG per tablet   Commonly known as: NORCO/VICODIN   Take 1-2 tablets by mouth every 4 (four) hours as needed for pain.      ibuprofen 200 MG tablet   Commonly known as: ADVIL,MOTRIN   Take 400 mg by mouth 2 (two) times daily as needed. For shoulder and back pain      ranitidine 150 MG capsule   Commonly known as: ZANTAC   Take 150 mg by mouth 2 (two) times daily.      thiothixene 5 MG capsule   Commonly known as: NAVANE   Take 10-15 mg by mouth at bedtime.           Follow-up Information    Follow up with [facility] A, MD in 3 weeks.   Contact information:   [address] [place] [address] [phone]          Signed: Shelly Rubenstein 12/02/2011, 6:33 AM

## 2011-12-14 ENCOUNTER — Encounter (INDEPENDENT_AMBULATORY_CARE_PROVIDER_SITE_OTHER): Payer: Self-pay | Admitting: Surgery

## 2011-12-14 ENCOUNTER — Ambulatory Visit (INDEPENDENT_AMBULATORY_CARE_PROVIDER_SITE_OTHER): Payer: BC Managed Care – HMO | Admitting: Surgery

## 2011-12-14 VITALS — BP 128/84 | HR 96 | Temp 98.2°F | Ht 69.0 in | Wt 249.2 lb

## 2011-12-14 DIAGNOSIS — Z09 Encounter for follow-up examination after completed treatment for conditions other than malignant neoplasm: Secondary | ICD-10-CM

## 2011-12-14 NOTE — Progress Notes (Signed)
Subjective:     Patient ID: [name], male   DOB: 06-30-1955, 56 y.o.   MRN: [id number]  HPI He is here for his first postop visit status post laparoscopic incisional hernia repair with mesh. He is doing well and has no complaints. Review of Systems     Objective:   Physical Exam    On exam, his incisions are well healed and there is no evidence of recurrence Assessment:     Patient stable postop    Plan:     He may return to normal activity. I will see him back as needed

## 2011-12-23 ENCOUNTER — Ambulatory Visit (INDEPENDENT_AMBULATORY_CARE_PROVIDER_SITE_OTHER): Payer: BC Managed Care – HMO | Admitting: Internal Medicine

## 2011-12-23 ENCOUNTER — Encounter: Payer: Self-pay | Admitting: Internal Medicine

## 2011-12-23 ENCOUNTER — Telehealth: Payer: Self-pay | Admitting: Internal Medicine

## 2011-12-23 VITALS — BP 140/90 | HR 105 | Temp 99.3°F | Resp 16 | Wt 246.0 lb

## 2011-12-23 DIAGNOSIS — M7661 Achilles tendinitis, right leg: Secondary | ICD-10-CM

## 2011-12-23 DIAGNOSIS — M7660 Achilles tendinitis, unspecified leg: Secondary | ICD-10-CM

## 2011-12-23 NOTE — Telephone Encounter (Signed)
Caller: Diana/Spouse; Patient Name: [name]; PCP: Illene Regulus (Adults only); Best Callback Phone Number: [phone].  Spouse calling reporting that pt. cannot bear weight on his L heel since 12/22/11.  No injury, deformity, redness or swelling noted.  Triaged with Foot Non Injury and emergent symptoms ruled out with exception to:  New onset mild to moderate pain not improved with 24 hours of home care.  Disposition:  See Provider within 24 hours.  Appointment scheduled for 16:15 today 12/23/11, with Dr. Arthur Holms.  Care instructions given.  CAN/db

## 2011-12-24 NOTE — Progress Notes (Signed)
Subjective:    Patient ID: [name], male    DOB: [birth year], 56 y.o.   MRN: [id number]  HPI [name] is seen acutely for right ankle pain. He reports he was on the sofa and his right leg "fell asleep." just after standing up he had the onset of severe pain in the posterior aspect of the ankle. He has not been able to bear weight due to the pain.  He is about 1 week s/p ventral hernia repair and is doing well.   Past Medical History  Diagnosis Date  . Hyperlipidemia   . Hypertension   . Depression   . Allergy   . Anxiety   . ALLERGIC RHINITIS 01/25/2007  . ANXIETY DISORDER, GENERALIZED 01/25/2007  . DEPRESSION 01/25/2007  . ESOPHAGITIS 12/28/2007  . GERD 01/25/2007  . GLUCOSE INTOLERANCE, HX OF 01/25/2007  . HEMORRHOIDS, INTERNAL, WITH BLEEDING 04/26/2008  . HYPERLIPIDEMIA 12/27/2007  . HYPERTENSION, ESSENTIAL NOS 01/25/2007  . Hyperthyroidism 08/11/2010  . HYPERTROPHY PROSTATE W/UR OBST & OTH LUTS 02/27/2010  . 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 LOW BACK PAIN, CHRONIC 11/08/2007  . 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 SCOLIOSIS NEC 01/25/2007  . Sebaceous cyst 08/10/2010  . SMOKER 08/05/2009  . UPPER GASTROINTESTINAL HEMORRHAGE 01/25/2007  . Rectal abscess   . Pancreatitis   . Fatty liver 10/09/2010  . Hypokalemia   . Hiatal hernia   . Heart murmur     hx of   . Arthritis    Past Surgical History  Procedure Date  . Excision of thyroid mass   . Abdominal exploration surgery 1988  . Hernia repair     ventral  . Cholecystectomy 05/20/08    Dr. Abbey Chatters  . Excision of scalp lesion 07/2009  . Shoulder surgery     right  . Ganglion cyst excision     right foot x 2  . Incisional hernia repair 12/01/2011    Procedure: LAPAROSCOPIC INCISIONAL HERNIA;  Surgeon: Shelly Rubenstein, MD;  Location: [facility] OR;  Service: General;  Laterality: N/A;   Family History  Problem Relation Age of Onset  . Hypertension Father   . Hypertension Mother   . Prostate cancer Other   .  Colon cancer Neg Hx    History   Social History  . Marital Status: Married    Spouse Name: N/A    Number of Children: 2  . Years of Education: N/A   Occupational History  . Disabled    Social History Main Topics  . Smoking status: Current Everyday Smoker -- 1.5 packs/day for 30 years    Types: Cigarettes  . Smokeless tobacco: Not on file  . Alcohol Use: No  . Drug Use: No  . Sexually Active: Yes -- Male partner(s)   Other Topics Concern  . Not on file   Social History Narrative   HSG disabled due to schizophrenia - 62. stable long-term marriage with children. cared for his father-in-law- passed away in 09-09-2022    Current Outpatient Prescriptions on File Prior to Visit  Medication Sig Dispense Refill  . amLODipine (NORVASC) 10 MG tablet Take 10 mg by mouth daily.      [name] HYDROcodone-acetaminophen (NORCO/VICODIN) 5-325 MG per tablet Take 1 tablet by mouth every 6 (six) hours as needed.      [name] ibuprofen (ADVIL,MOTRIN) 200 MG tablet Take 400 mg by mouth 2 (two) times daily as needed. For shoulder and back pain      . ranitidine (ZANTAC) 150 MG  capsule Take 150 mg by mouth 2 (two) times daily.      [name] thiothixene (NAVANE) 5 MG capsule Take 10-15 mg by mouth at bedtime.           Review of Systems System review is negative for any constitutional, cardiac, pulmonary, GI or neuro symptoms or complaints other than as described in the HPI.     Objective:   Physical Exam Filed Vitals:   12/23/11 1714  BP: 140/90  Pulse: 105  Temp: 99.3 F (37.4 C)  Resp: 16   Gen'l - heavyset AA man with ankle pain. Cor- RRR Pulm - normal respirations MSK- patient has pain in bearing weight and hops over to the exam table. There is no swelling to the foot. There is heat at the achilles tendon. He is able to plantar flex and dorsal flex the ankle but has pain when doing this       Assessment & Plan:  Acute achilles tendonitis - full avulsion unlikely with the ability to flex the  ankle.  Plan  Wrapped with a 4" ACE bandage  Advised he could take pain meds left over from surgery  Referral to [place] [facility]  Addendum - referral not placed. Called patient. He reports he is doing better  Plan Continue to use ACE wrap and use a cane for walking  Call Monday if the pain is not better

## 2011-12-31 ENCOUNTER — Telehealth: Payer: Self-pay | Admitting: Internal Medicine

## 2011-12-31 ENCOUNTER — Emergency Department (HOSPITAL_COMMUNITY)
Admission: EM | Admit: 2011-12-31 | Discharge: 2011-12-31 | Discharge status: Against Medical Advice | Payer: BC Managed Care – HMO | Attending: Emergency Medicine | Admitting: Emergency Medicine

## 2011-12-31 DIAGNOSIS — M79609 Pain in unspecified limb: Secondary | ICD-10-CM | POA: Insufficient documentation

## 2011-12-31 DIAGNOSIS — M79673 Pain in unspecified foot: Secondary | ICD-10-CM

## 2011-12-31 NOTE — Telephone Encounter (Signed)
Wife says pt requesting orthopedic referral for his foot ----Pt having reallly bad foot pain---Pt ph#--[phone]

## 2012-01-02 ENCOUNTER — Emergency Department (HOSPITAL_COMMUNITY): Payer: BC Managed Care – PPO

## 2012-01-02 ENCOUNTER — Emergency Department (HOSPITAL_COMMUNITY)
Admission: EM | Admit: 2012-01-02 | Discharge: 2012-01-02 | Disposition: A | Discharge status: Home / Self Care | Payer: BC Managed Care – PPO | Attending: Emergency Medicine | Admitting: Emergency Medicine

## 2012-01-02 DIAGNOSIS — R109 Unspecified abdominal pain: Secondary | ICD-10-CM | POA: Insufficient documentation

## 2012-01-02 DIAGNOSIS — F17200 Nicotine dependence, unspecified, uncomplicated: Secondary | ICD-10-CM | POA: Insufficient documentation

## 2012-01-02 DIAGNOSIS — E785 Hyperlipidemia, unspecified: Secondary | ICD-10-CM | POA: Insufficient documentation

## 2012-01-02 DIAGNOSIS — I10 Essential (primary) hypertension: Secondary | ICD-10-CM | POA: Insufficient documentation

## 2012-01-02 DIAGNOSIS — Z8739 Personal history of other diseases of the musculoskeletal system and connective tissue: Secondary | ICD-10-CM | POA: Insufficient documentation

## 2012-01-02 DIAGNOSIS — Z79899 Other long term (current) drug therapy: Secondary | ICD-10-CM | POA: Insufficient documentation

## 2012-01-02 DIAGNOSIS — Z9089 Acquired absence of other organs: Secondary | ICD-10-CM | POA: Insufficient documentation

## 2012-01-02 DIAGNOSIS — E876 Hypokalemia: Secondary | ICD-10-CM | POA: Insufficient documentation

## 2012-01-02 DIAGNOSIS — F341 Dysthymic disorder: Secondary | ICD-10-CM | POA: Insufficient documentation

## 2012-01-02 LAB — URINALYSIS, ROUTINE W REFLEX MICROSCOPIC
Bilirubin Urine: NEGATIVE
Glucose, UA: NEGATIVE mg/dL
Hgb urine dipstick: NEGATIVE
Protein, ur: NEGATIVE mg/dL
Specific Gravity, Urine: 1.014 (ref 1.005–1.030)
Urobilinogen, UA: 0.2 mg/dL (ref 0.0–1.0)

## 2012-01-02 LAB — COMPREHENSIVE METABOLIC PANEL
ALT: 11 U/L (ref 0–53)
Alkaline Phosphatase: 88 U/L (ref 39–117)
BUN: 7 mg/dL (ref 6–23)
Chloride: 100 mEq/L (ref 96–112)
GFR calc Af Amer: 81 mL/min — ABNORMAL LOW (ref >90)
Glucose, Bld: 125 mg/dL — ABNORMAL HIGH (ref 70–99)
Potassium: 2.8 mEq/L — ABNORMAL LOW (ref 3.5–5.1)
Sodium: 141 mEq/L (ref 135–145)
Total Bilirubin: 0.4 mg/dL (ref 0.3–1.2)
Total Protein: 8 g/dL (ref 6.0–8.3)

## 2012-01-02 LAB — CBC WITH DIFFERENTIAL/PLATELET
Hemoglobin: 15 g/dL (ref 13.0–17.0)
Lymphocytes Relative: 18 % (ref 12–46)
Lymphs Abs: 2.2 10*3/uL (ref 0.7–4.0)
MCH: 29.4 pg (ref 26.0–34.0)
Monocytes Relative: 4 % (ref 3–12)
Neutro Abs: 9.4 10*3/uL — ABNORMAL HIGH (ref 1.7–7.7)
Neutrophils Relative %: 77 % (ref 43–77)
Platelets: 330 10*3/uL (ref 150–400)
RBC: 5.1 MIL/uL (ref 4.22–5.81)
WBC: 12.1 10*3/uL — ABNORMAL HIGH (ref 4.0–10.5)

## 2012-01-02 LAB — POCT I-STAT, CHEM 8
BUN: 5 mg/dL — ABNORMAL LOW (ref 6–23)
Creatinine, Ser: 1.4 mg/dL — ABNORMAL HIGH (ref 0.50–1.35)
Glucose, Bld: 127 mg/dL — ABNORMAL HIGH (ref 70–99)
Potassium: 2.8 mEq/L — ABNORMAL LOW (ref 3.5–5.1)
Sodium: 141 mEq/L (ref 135–145)

## 2012-01-02 LAB — LIPASE, BLOOD: Lipase: 35 U/L (ref 11–59)

## 2012-01-02 MED ORDER — PROMETHAZINE HCL 25 MG PO TABS: 25.0000 mg | ORAL_TABLET | Freq: Four times a day (QID) | ORAL | Status: DC | PRN

## 2012-01-02 MED ORDER — ONDANSETRON 4 MG PO TBDP: 4.0000 mg | ORAL_TABLET | Freq: Three times a day (TID) | ORAL | Status: AC | PRN

## 2012-01-02 MED ORDER — HYDROMORPHONE HCL PF 1 MG/ML IJ SOLN
1.0000 mg | Freq: Once | INTRAMUSCULAR | Status: AC
  Administered 2012-01-02: 1 mg via INTRAVENOUS
  Filled 2012-01-02: qty 1

## 2012-01-02 MED ORDER — SODIUM CHLORIDE 0.9 % IV BOLUS (SEPSIS)
1000.0000 mL | INTRAVENOUS | Status: AC
  Administered 2012-01-02: 1000 mL via INTRAVENOUS

## 2012-01-02 MED ORDER — POTASSIUM CHLORIDE CRYS ER 20 MEQ PO TBCR: 20.0000 meq | EXTENDED_RELEASE_TABLET | Freq: Two times a day (BID) | ORAL | Status: DC

## 2012-01-02 MED ORDER — ONDANSETRON HCL 4 MG/2ML IJ SOLN
4.0000 mg | Freq: Once | INTRAMUSCULAR | Status: AC
  Administered 2012-01-02: 4 mg via INTRAVENOUS
  Filled 2012-01-02: qty 2

## 2012-01-02 MED ORDER — PROMETHAZINE HCL 25 MG/ML IJ SOLN
25.0000 mg | Freq: Once | INTRAMUSCULAR | Status: AC
  Administered 2012-01-02: 25 mg via INTRAVENOUS
  Filled 2012-01-02: qty 1

## 2012-01-02 MED ORDER — OXYCODONE-ACETAMINOPHEN 5-325 MG PO TABS: 1.0000 | ORAL_TABLET | ORAL | Status: AC | PRN

## 2012-01-02 MED ORDER — POTASSIUM CHLORIDE CRYS ER 20 MEQ PO TBCR
40.0000 meq | EXTENDED_RELEASE_TABLET | Freq: Once | ORAL | Status: AC
  Administered 2012-01-02: 40 meq via ORAL
  Filled 2012-01-02 (×2): qty 2

## 2012-01-02 NOTE — ED Provider Notes (Signed)
History     CSN: [id number]  Arrival date & time 01/02/12  0134   First MD Initiated Contact with Patient 01/02/12 0146      Chief Complaint  Patient presents with  . Abdominal Pain    (Consider location/radiation/quality/duration/timing/severity/associated sxs/prior treatment) HPI Comments: 56 year old male with a history of pancreatitis that was thought to have started secondary to cholecystitis who has had recurrent episodes of pancreatitis in the past as well as multiple procedures on his abdomen to repair hernias. The most recent procedure was performed in the last month, he had an incisional ventral hernia repair performed on 12/01/2011. Since that time his abdomen is done fairly well and he has avoided the nausea and vomiting and epigastric pain. This evening he developed gradual onset of nausea vomiting and epigastric pain which is persistent, severe, nothing makes better or worse and is similar to prior pancreatitis. He does admit to mild abdominal distention but no fevers chills coughing shortness of breath swelling of the lower extremities.  Patient is a 56 y.o. male presenting with abdominal pain. The history is provided by the patient and medical records.  Abdominal Pain The primary symptoms of the illness include abdominal pain.    Past Medical History  Diagnosis Date  . Hyperlipidemia   . Hypertension   . Depression   . Allergy   . Anxiety   . ALLERGIC RHINITIS 01/25/2007  . ANXIETY DISORDER, GENERALIZED 01/25/2007  . DEPRESSION 01/25/2007  . ESOPHAGITIS 12/28/2007  . GERD 01/25/2007  . GLUCOSE INTOLERANCE, HX OF 01/25/2007  . HEMORRHOIDS, INTERNAL, WITH BLEEDING 04/26/2008  . HYPERLIPIDEMIA 12/27/2007  . HYPERTENSION, ESSENTIAL NOS 01/25/2007  . Hyperthyroidism 08/11/2010  . HYPERTROPHY PROSTATE W/UR OBST & OTH LUTS 02/27/2010  . 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 LOW BACK PAIN, CHRONIC 11/08/2007  . 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 SCOLIOSIS NEC 01/25/2007    . Sebaceous cyst 08/10/2010  . SMOKER 08/05/2009  . UPPER GASTROINTESTINAL HEMORRHAGE 01/25/2007  . Rectal abscess   . Pancreatitis   . Fatty liver 10/09/2010  . Hypokalemia   . Hiatal hernia   . Heart murmur     hx of   . Arthritis     Past Surgical History  Procedure Date  . Excision of thyroid mass   . Abdominal exploration surgery 1988  . Hernia repair     ventral  . Cholecystectomy 05/20/08    Dr. Abbey Chatters  . Excision of scalp lesion 07/2009  . Shoulder surgery     right  . Ganglion cyst excision     right foot x 2  . Incisional hernia repair 12/01/2011    Procedure: LAPAROSCOPIC INCISIONAL HERNIA;  Surgeon: Shelly Rubenstein, MD;  Location: [facility] OR;  Service: General;  Laterality: N/A;    Family History  Problem Relation Age of Onset  . Hypertension Father   . Hypertension Mother   . Prostate cancer Other   . Colon cancer Neg Hx     History  Substance Use Topics  . Smoking status: Current Every Day Smoker -- 1.5 packs/day for 30 years    Types: Cigarettes  . Smokeless tobacco: Not on file  . Alcohol Use: No      Review of Systems  Gastrointestinal: Positive for abdominal pain.  All other systems reviewed and are negative.    Allergies  Celecoxib; Iohexol; and Metoclopramide hcl  Home Medications   Current Outpatient Rx  Name Route Sig Dispense Refill  . ACETAMINOPHEN 325 MG PO TABS  Oral Take 650 mg by mouth every 6 (six) hours as needed. For pain    . AMLODIPINE BESYLATE 10 MG PO TABS Oral Take 10 mg by mouth daily.    [name] RANITIDINE HCL 150 MG PO CAPS Oral Take 150 mg by mouth 2 (two) times daily.    . THIOTHIXENE 5 MG PO CAPS Oral Take 15 mg by mouth at bedtime.    [name] ONDANSETRON 4 MG PO TBDP Oral Take 1 tablet (4 mg total) by mouth every 8 (eight) hours as needed for nausea. 10 tablet 0  . OXYCODONE-ACETAMINOPHEN 5-325 MG PO TABS Oral Take 1 tablet by mouth every 4 (four) hours as needed for pain. 20 tablet 0  . POTASSIUM CHLORIDE CRYS ER 20 MEQ PO  TBCR Oral Take 1 tablet (20 mEq total) by mouth 2 (two) times daily. 20 tablet 0  . PROMETHAZINE HCL 25 MG PO TABS Oral Take 1 tablet (25 mg total) by mouth every 6 (six) hours as needed for nausea. 12 tablet 0    BP 173/91  Pulse 95  Temp 98.1 F (36.7 C) (Oral)  Resp 18  SpO2 93%  Physical Exam  Nursing note and vitals reviewed. Constitutional: He appears well-developed and well-nourished.       Uncomfortable appearing, vomiting during exam  HENT:  Head: Normocephalic and atraumatic.  Mouth/Throat: Oropharynx is clear and moist. No oropharyngeal exudate.  Eyes: Conjunctivae normal and EOM are normal. Pupils are equal, round, and reactive to light. Right eye exhibits no discharge. Left eye exhibits no discharge. No scleral icterus.  Neck: Normal range of motion. Neck supple. No JVD present. No thyromegaly present.  Cardiovascular: Normal rate, regular rhythm, normal heart sounds and intact distal pulses.  Exam reveals no gallop and no friction rub.   No murmur heard. Pulmonary/Chest: Effort normal and breath sounds normal. No respiratory distress. He has no wheezes. He has no rales.  Abdominal: Soft. Bowel sounds are normal. He exhibits no distension and no mass. There is tenderness ( Tenderness in the epigastrium and supraumbilical area, small knot at the superior aspect of the ventral incision, wound is dry and healed and close.).       No peritoneal signs, no lower abdominal tenderness, no pain in the right upper quadrant on palpation  Musculoskeletal: Normal range of motion. He exhibits no edema and no tenderness.  Lymphadenopathy:    He has no cervical adenopathy.  Neurological: He is alert. Coordination normal.  Skin: Skin is warm and dry. No rash noted. No erythema.  Psychiatric: He has a normal mood and affect. His behavior is normal.    ED Course  Procedures (including critical care time)  Labs Reviewed  CBC WITH DIFFERENTIAL - Abnormal; Notable for the following:     WBC 12.1 (*)     Neutro Abs 9.4 (*)     All other components within normal limits  COMPREHENSIVE METABOLIC PANEL - Abnormal; Notable for the following:    Potassium 2.8 (*)     Glucose, Bld 125 (*)     GFR calc non Af Amer 70 (*)     GFR calc Af Amer 81 (*)     All other components within normal limits  LACTIC ACID, PLASMA - Abnormal; Notable for the following:    Lactic Acid, Venous 2.9 (*)     All other components within normal limits  URINALYSIS, ROUTINE W REFLEX MICROSCOPIC - Abnormal; Notable for the following:    Ketones, ur 15 (*)  All other components within normal limits  POCT I-STAT, CHEM 8 - Abnormal; Notable for the following:    Potassium 2.8 (*)     BUN 5 (*)     Creatinine, Ser 1.40 (*)     Glucose, Bld 127 (*)     All other components within normal limits  LIPASE, BLOOD   Dg Abd Acute W/chest  01/02/2012  *RADIOLOGY REPORT*  Clinical Data: Generalized abdominal pain  ACUTE ABDOMEN SERIES (ABDOMEN 2 VIEW & CHEST 1 VIEW)  Comparison: 11/26/2011 chest radiograph, 10/09/2010 abdominal CT  Findings: Cardiomediastinal contours are unchanged.  Lungs remain predominately clear.  No free intraperitoneal air.  Surgical clips right upper quadrant. The bowel gas pattern is non-obstructive. Organ outlines are normal where seen. No acute or aggressive osseous abnormality identified.  IMPRESSION: Nonobstructive bowel gas pattern.   Original Report Authenticated By: Waneta Martins, M.D.      1. Abdominal pain   2. Hypokalemia       MDM  The patient appears to be in significant discomfort from nausea and vomiting with moderate reproducible tenderness in the epigastric area. Would consider recurrent pancreatitis as well as postoperative bowel obstruction as the patient likely has significant adhesions in his abdomen. Acute abdominal series, labs, symptomatic medications.   Review of the patient's laboratory data suggests that he has slight dehydration with mild ketonuria,  hypokalemia and a normal lipase and liver function tests. His lactic acid is slightly elevated at 2.9 his white blood cell count is 12,100. The acute abdominal series shows a nonobstructive bowel gas pattern and the patient has improved significantly since getting medications.  I described to the patient in detail all of his findings and I have recommended to him several times that he needs to be admitted to the hospital for observation, fluids, pain control and to make sure that his acidemia improves. He has declined, he has been able to express to me the indications for return and appears to have medical decision capacity making ability at this time. The patient again has been offered admission and declines, pain medications including Percocet with Phenergan and Zofran given for home. Potassium supplementation as well.     Vida Roller, MD 01/02/12 [phone]

## 2012-01-02 NOTE — ED Notes (Signed)
Per EMS:  Pt reports generalized abdominal pain since Friday - Pt has hx of pancreatitis.  Was seen in ED yesterday for foot pain and pt believes he has a virus from the hospital

## 2012-01-03 NOTE — Telephone Encounter (Signed)
referral placed with [facility]

## 2012-01-03 NOTE — Telephone Encounter (Signed)
Spoke with patient on Friday 12/31/11 and advised that note sent to MD for referral and we will notify of appt. Patient stated that he was having bad foot pain and was currently sitting in ER. I advised pt to see one of the [facility] here or seek UC to avoid long wait in ER. Patient declined appt and decided to continue sitting in ER.  Pt request referral to Ortho Thanks

## 2012-01-06 ENCOUNTER — Ambulatory Visit (INDEPENDENT_AMBULATORY_CARE_PROVIDER_SITE_OTHER): Payer: BC Managed Care – HMO | Admitting: Internal Medicine

## 2012-01-06 ENCOUNTER — Other Ambulatory Visit: Payer: Self-pay | Admitting: Internal Medicine

## 2012-01-06 DIAGNOSIS — I10 Essential (primary) hypertension: Secondary | ICD-10-CM

## 2012-01-06 MED ORDER — FUROSEMIDE 40 MG PO TABS: 40.0000 mg | ORAL_TABLET | Freq: Every day | ORAL | Status: DC

## 2012-01-09 NOTE — Progress Notes (Signed)
  Subjective:    Patient ID: [name], male    DOB: Jun 16, 1955, 56 y.o.   MRN: [id number]  HPI [name] is seen as an urgent walk-in: he had markedly elevated blood pressure while at the orthopedics office. He has been asymptomatic: no epistaxis, HA, change in vision.  PMH, FamHx and SocHx reviewed for any changes and relevance.  Current Outpatient Prescriptions on File Prior to Visit  Medication Sig Dispense Refill  . acetaminophen (TYLENOL) 325 MG tablet Take 650 mg by mouth every 6 (six) hours as needed. For pain      . amLODipine (NORVASC) 10 MG tablet Take 10 mg by mouth daily.      . furosemide (LASIX) 40 MG tablet Take 1 tablet (40 mg total) by mouth daily.  30 tablet  11  . ondansetron (ZOFRAN ODT) 4 MG disintegrating tablet Take 1 tablet (4 mg total) by mouth every 8 (eight) hours as needed for nausea.  10 tablet  0  . oxyCODONE-acetaminophen (PERCOCET) 5-325 MG per tablet Take 1 tablet by mouth every 4 (four) hours as needed for pain.  20 tablet  0  . potassium chloride SA (K-DUR,KLOR-CON) 20 MEQ tablet Take 1 tablet (20 mEq total) by mouth 2 (two) times daily.  20 tablet  0  . promethazine (PHENERGAN) 25 MG tablet Take 1 tablet (25 mg total) by mouth every 6 (six) hours as needed for nausea.  12 tablet  0  . ranitidine (ZANTAC) 150 MG capsule Take 150 mg by mouth 2 (two) times daily.      [name] thiothixene (NAVANE) 5 MG capsule Take 15 mg by mouth at bedtime.          Review of Systems System review is negative for any constitutional, cardiac, pulmonary, GI or neuro symptoms or complaints other than as described in the HPI.     Objective:   Physical Exam BP 170/98 Gen'l- WNWD AA man in no distress HEENT- C&S clear, fundi - w/o papilledema or hemorrhage Cor - RRR PUlm - normal respirations.       Assessment & Plan:

## 2012-01-09 NOTE — Assessment & Plan Note (Signed)
BP Readings from Last 3 Encounters:  01/02/12 173/91  12/23/11 140/90  12/14/11 128/84   Rising BP.  Plan Continue amlodipine  Add furosemide 40 mg daily  ROV 7-10 days for BP check

## 2012-01-12 ENCOUNTER — Ambulatory Visit: Payer: BC Managed Care – PPO | Admitting: Internal Medicine

## 2012-03-11 ENCOUNTER — Other Ambulatory Visit: Payer: Self-pay | Admitting: Internal Medicine

## 2012-04-05 ENCOUNTER — Encounter: Payer: Self-pay | Admitting: Internal Medicine

## 2012-04-05 ENCOUNTER — Ambulatory Visit (INDEPENDENT_AMBULATORY_CARE_PROVIDER_SITE_OTHER): Payer: BC Managed Care – PPO | Admitting: Internal Medicine

## 2012-04-05 VITALS — BP 162/92 | HR 90 | Temp 100.0°F | Ht 69.0 in | Wt 247.0 lb

## 2012-04-05 DIAGNOSIS — M25519 Pain in unspecified shoulder: Secondary | ICD-10-CM

## 2012-04-05 DIAGNOSIS — M25511 Pain in right shoulder: Secondary | ICD-10-CM

## 2012-04-05 MED ORDER — DICLOFENAC SODIUM 75 MG PO TBEC: 75.0000 mg | DELAYED_RELEASE_TABLET | Freq: Two times a day (BID) | ORAL | Status: DC

## 2012-04-05 MED ORDER — METHYLPREDNISOLONE ACETATE 80 MG/ML IJ SUSP
120.0000 mg | Freq: Once | INTRAMUSCULAR | Status: AC
  Administered 2012-04-05: 120 mg via INTRAMUSCULAR

## 2012-04-05 NOTE — Patient Instructions (Signed)
Shoulder Exercises  EXERCISES   RANGE OF MOTION (ROM) AND STRETCHING EXERCISES  These exercises may help you when beginning to rehabilitate your injury. Your symptoms may resolve with or without further involvement from your physician, physical therapist or athletic trainer. While completing these exercises, remember:   · Restoring tissue flexibility helps normal motion to return to the joints. This allows healthier, less painful movement and activity.  · An effective stretch should be held for at least 30 seconds.  · A stretch should never be painful. You should only feel a gentle lengthening or release in the stretched tissue.  ROM - Pendulum  · Bend at the waist so that your right / left arm falls away from your body. Support yourself with your opposite hand on a solid surface, such as a table or a countertop.  · Your right / left arm should be perpendicular to the ground. If it is not perpendicular, you need to lean over farther. Relax the muscles in your right / left arm and shoulder as much as possible.  · Gently sway your hips and trunk so they move your right / left arm without any use of your right / left shoulder muscles.  · Progress your movements so that your right / left arm moves side to side, then forward and backward, and finally, both clockwise and counterclockwise.  · Complete __________ repetitions in each direction. Many people use this exercise to relieve discomfort in their shoulder as well as to gain range of motion.  Repeat __________ times. Complete this exercise __________ times per day.  STRETCH - Flexion, Standing  · Stand with good posture. With an underhand grip on your right / left hand and an overhand grip on the opposite hand, grasp a broomstick or cane so that your hands are a little more than shoulder-width apart.  · Keeping your right / left elbow straight and shoulder muscles relaxed, push the stick with your opposite hand to raise your right / left arm in front of your body and  then overhead. Raise your arm until you feel a stretch in your right / left shoulder, but before you have increased shoulder pain.  · Try to avoid shrugging your right / left shoulder as your arm rises by keeping your shoulder blade tucked down and toward your mid-back spine. Hold __________ seconds.  · Slowly return to the starting position.  Repeat __________ times. Complete this exercise __________ times per day.  STRETCH - Internal Rotation  · Place your right / left hand behind your back, palm-up.  · Throw a towel or belt over your opposite shoulder. Grasp the towel/belt with your right / left hand.  · While keeping an upright posture, gently pull up on the towel/belt until you feel a stretch in the front of your right / left shoulder.  · Avoid shrugging your right / left shoulder as your arm rises by keeping your shoulder blade tucked down and toward your mid-back spine.  · Hold __________. Release the stretch by lowering your opposite hand.  Repeat __________ times. Complete this exercise __________ times per day.  STRETCH - External Rotation and Abduction  · Stagger your stance through a doorframe. It does not matter which foot is forward.  · As instructed by your physician, physical therapist or athletic trainer, place your hands:  · And forearms above your head and on the door frame.  · And forearms at head-height and on the door frame.  · At elbow-height and   on the door frame.  · Keeping your head and chest upright and your stomach muscles tight to prevent over-extending your low-back, slowly shift your weight onto your front foot until you feel a stretch across your chest and/or in the front of your shoulders.  · Hold __________ seconds. Shift your weight to your back foot to release the stretch.  Repeat __________ times. Complete this stretch __________ times per day.   STRENGTHENING EXERCISES   These exercises may help you when beginning to rehabilitate your injury. They may resolve your symptoms with  or without further involvement from your physician, physical therapist or athletic trainer. While completing these exercises, remember:   · Muscles can gain both the endurance and the strength needed for everyday activities through controlled exercises.  · Complete these exercises as instructed by your physician, physical therapist or athletic trainer. Progress the resistance and repetitions only as guided.  · You may experience muscle soreness or fatigue, but the pain or discomfort you are trying to eliminate should never worsen during these exercises. If this pain does worsen, stop and make certain you are following the directions exactly. If the pain is still present after adjustments, discontinue the exercise until you can discuss the trouble with your clinician.  · If advised by your physician, during your recovery, avoid activity or exercises which involve actions that place your right / left hand or elbow above your head or behind your back or head. These positions stress the tissues which are trying to heal.  STRENGTH - Scapular Depression and Adduction  · With good posture, sit on a firm chair. Supported your arms in front of you with pillows, arm rests or a table top. Have your elbows in line with the sides of your body.  · Gently draw your shoulder blades down and toward your mid-back spine. Gradually increase the tension without tensing the muscles along the top of your shoulders and the back of your neck.  · Hold for __________ seconds. Slowly release the tension and relax your muscles completely before completing the next repetition.  · After you have practiced this exercise, remove the arm support and complete it in standing as well as sitting.  Repeat __________ times. Complete this exercise __________ times per day.   STRENGTH - External Rotators  · Secure a rubber exercise band/tubing to a fixed object so that it is at the same height as your right / left elbow when you are standing or sitting on a  firm surface.  · Stand or sit so that the secured exercise band/tubing is at your side that is not injured.  · Bend your elbow 90 degrees. Place a folded towel or small pillow under your right / left arm so that your elbow is a few inches away from your side.  · Keeping the tension on the exercise band/tubing, pull it away from your body, as if pivoting on your elbow. Be sure to keep your body steady so that the movement is only coming from your shoulder rotating.  · Hold __________ seconds. Release the tension in a controlled manner as you return to the starting position.  Repeat __________ times. Complete this exercise __________ times per day.   STRENGTH - Supraspinatus  · Stand or sit with good posture. Grasp a __________ weight or an exercise band/tubing so that your hand is "thumbs-up," like when you shake hands.  · Slowly lift your right / left hand from your thigh into the air, traveling about 30   degrees from straight out at your side. Lift your hand to shoulder height or as far as you can without increasing any shoulder pain. Initially, many people do not lift their hands above shoulder height.  · Avoid shrugging your right / left shoulder as your arm rises by keeping your shoulder blade tucked down and toward your mid-back spine.  · Hold for __________ seconds. Control the descent of your hand as you slowly return to your starting position.  Repeat __________ times. Complete this exercise __________ times per day.   STRENGTH - Shoulder Extensors  · Secure a rubber exercise band/tubing so that it is at the height of your shoulders when you are either standing or sitting on a firm arm-less chair.  · With a thumbs-up grip, grasp an end of the band/tubing in each hand. Straighten your elbows and lift your hands straight in front of you at shoulder height. Step back away from the secured end of band/tubing until it becomes tense.  · Squeezing your shoulder blades together, pull your hands down to the sides of  your thighs. Do not allow your hands to go behind you.  · Hold for __________ seconds. Slowly ease the tension on the band/tubing as you reverse the directions and return to the starting position.  Repeat __________ times. Complete this exercise __________ times per day.   STRENGTH - Scapular Retractors  · Secure a rubber exercise band/tubing so that it is at the height of your shoulders when you are either standing or sitting on a firm arm-less chair.  · With a palm-down grip, grasp an end of the band/tubing in each hand. Straighten your elbows and lift your hands straight in front of you at shoulder height. Step back away from the secured end of band/tubing until it becomes tense.  · Squeezing your shoulder blades together, draw your elbows back as you bend them. Keep your upper arm lifted away from your body throughout the exercise.  · Hold __________ seconds. Slowly ease the tension on the band/tubing as you reverse the directions and return to the starting position.  Repeat __________ times. Complete this exercise __________ times per day.  STRENGTH - Scapular Depressors  · Find a sturdy chair without wheels, such as a from a dining room table.  · Keeping your feet on the floor, lift your bottom from the seat and lock your elbows.  · Keeping your elbows straight, allow gravity to pull your body weight down. Your shoulders will rise toward your ears.  · Raise your body against gravity by drawing your shoulder blades down your back, shortening the distance between your shoulders and ears. Although your feet should always maintain contact with the floor, your feet should progressively support less body weight as you get stronger.  · Hold __________ seconds. In a controlled and slow manner, lower your body weight to begin the next repetition.  Repeat __________ times. Complete this exercise __________ times per day.   Document Released: 02/17/2005 Document Revised: 06/28/2011 Document Reviewed:  07/18/2008  [employer]® Patient Information ©2013 [employer].

## 2012-04-05 NOTE — Progress Notes (Signed)
Subjective:    Patient ID: [name], male    DOB: Nov 22, 1955, 56 y.o.   MRN: [id number]  HPI  Pt presents to the clinic today with c/o right shoulder pain. This is a chronic problem for him. He has had surgery previously on this shoulder. He has not taken anything for the pain. He does have an appointment to see his orthopedist next month. The pain is 7/10. Nothing makes it worse it is just a won't go away.  Review of Systems     Past Medical History  Diagnosis Date  . Hyperlipidemia   . Hypertension   . Depression   . Allergy   . Anxiety   . ALLERGIC RHINITIS 01/25/2007  . ANXIETY DISORDER, GENERALIZED 01/25/2007  . DEPRESSION 01/25/2007  . ESOPHAGITIS 12/28/2007  . GERD 01/25/2007  . GLUCOSE INTOLERANCE, HX OF 01/25/2007  . HEMORRHOIDS, INTERNAL, WITH BLEEDING 04/26/2008  . HYPERLIPIDEMIA 12/27/2007  . HYPERTENSION, ESSENTIAL NOS 01/25/2007  . Hyperthyroidism 08/11/2010  . HYPERTROPHY PROSTATE W/UR OBST & OTH LUTS 02/27/2010  . 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 LOW BACK PAIN, CHRONIC 11/08/2007  . 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 SCOLIOSIS NEC 01/25/2007  . Sebaceous cyst 08/10/2010  . SMOKER 08/05/2009  . UPPER GASTROINTESTINAL HEMORRHAGE 01/25/2007  . Rectal abscess   . Pancreatitis   . Fatty liver 10/09/2010  . Hypokalemia   . Hiatal hernia   . Heart murmur     hx of   . Arthritis     Current Outpatient Prescriptions  Medication Sig Dispense Refill  . acetaminophen (TYLENOL) 325 MG tablet Take 650 mg by mouth every 6 (six) hours as needed. For pain      . amLODipine (NORVASC) 10 MG tablet Take 10 mg by mouth daily.      . furosemide (LASIX) 40 MG tablet Take 1 tablet (40 mg total) by mouth daily.  30 tablet  11  . KLOR-CON M20 20 MEQ tablet TAKE 1 TABLET BY MOUTH 2 TIMES DAILY  60 tablet  3  . promethazine (PHENERGAN) 25 MG tablet Take 1 tablet (25 mg total) by mouth every 6 (six) hours as needed for nausea.  12 tablet  0  . ranitidine (ZANTAC) 150 MG  capsule Take 150 mg by mouth 2 (two) times daily.      [name] thiothixene (NAVANE) 5 MG capsule Take 15 mg by mouth at bedtime.        Allergies  Allergen Reactions  . Celecoxib     REACTION: rectal bleeding  . Iohexol     Affects nerves: triggers schizophrenia  . Metoclopramide Hcl     Affects nerves: triggers schizophrenia    Family History  Problem Relation Age of Onset  . Hypertension Father   . Hypertension Mother   . Prostate cancer Other   . Colon cancer Neg Hx     History   Social History  . Marital Status: Married    Spouse Name: N/A    Number of Children: 2  . Years of Education: N/A   Occupational History  . Disabled    Social History Main Topics  . Smoking status: Current Every Day Smoker -- 1.5 packs/day for 30 years    Types: Cigarettes  . Smokeless tobacco: Not on file  . Alcohol Use: No  . Drug Use: No  . Sexually Active: Yes -- Male partner(s)   Other Topics Concern  . Not on file   Social History Narrative   HSG disabled due  to schizophrenia - 1983. stable long-term marriage with children. cared for his father-in-law- passed away in 09/02/22     Constitutional: Denies fever, malaise, fatigue, headache or abrupt weight changes.  Musculoskeletal: Pt reports right shoulder pain. Denies difficulty with gait, muscle pain or joint pain and swelling.  Skin: Denies redness, rashes, lesions or ulcercations.  Neurological: Denies numbness or tingling in the hands, dizziness, difficulty with memory, difficulty with speech or problems with balance and coordination.   No other specific complaints in a complete review of systems (except as listed in HPI above).   Objective:   Physical Exam   BP 162/92  Pulse 90  Temp 100 F (37.8 C) (Oral)  Ht 5\' 9"  (1.753 m)  Wt 247 lb (112.038 kg)  BMI 36.48 kg/m2  SpO2 98% Wt Readings from Last 3 Encounters:  04/05/12 247 lb (112.038 kg)  12/23/11 246 lb (111.585 kg)  12/14/11 249 lb 3.2 oz (113.036 kg)     General: Appears his stated age, well developed, well nourished in NAD.  Cardiovascular: Normal rate and rhythm. S1,S2 noted.  No murmur, rubs or gallops noted. No JVD or BLE edema. No carotid bruits noted. Pulmonary/Chest: Normal effort and positive vesicular breath sounds. No respiratory distress. No wheezes, rales or ronchi noted.  Musculoskeletal: Normal range of motion. No signs of joint swelling. No difficulty with gait.  Neurological: Alert and oriented. Cranial nerves II-XII intact. Coordination normal. +DTRs bilaterally.       Assessment & Plan:   Shoulder pain, new onset with additional workup required:  Diclofenac 75 mg BID Depo medrol in office today  RTC as needed or if symptoms persist

## 2012-05-03 ENCOUNTER — Telehealth: Payer: Self-pay | Admitting: *Deleted

## 2012-05-03 NOTE — Telephone Encounter (Signed)
[name] Paling, NP with [facility] called wanting to notify Dr Debby Bud that the pt has admitted to frequent marijuana use for pain. She felt that Dr Debby Bud needed to know due to the pt's history of schizophrenia.

## 2012-05-04 ENCOUNTER — Telehealth: Payer: Self-pay | Admitting: Internal Medicine

## 2012-05-04 NOTE — Telephone Encounter (Signed)
Patient Information:  Caller Name: [name]  Phone: [phone]  Patient: [name], [name]  Gender: Male  DOB: [birth year]  Age: 57 Years  PCP: Illene Regulus (Adults only)  Office Follow Up:  Does the office need to follow up with this patient?: No  Instructions For The Office: N/A   Symptoms  Reason For Call & Symptoms: Calling today 05/04/12 regarding having burning in upper stomach.  York Spaniel this normally happens when he has a UTI because he is not able to tell when he needs to urinate.  No bloody urine or cloudy urine, no pain with urination.  Reviewed Health History In EMR: Yes  Reviewed Medications In EMR: Yes  Reviewed Allergies In EMR: Yes  Reviewed Surgeries / Procedures: Yes  Date of Onset of Symptoms: 04/13/2012  Treatments Tried: Pepcid AC  Treatments Tried Worked: No  Guideline(s) Used:  Abdominal Pain - Upper  Disposition Per Guideline:   See Today or Tomorrow in Office  Reason For Disposition Reached:   Mild pain that comes and goes (cramps) lasts > 24 hours  Advice Given:  Diet:  Avoid alcohol or caffeinated beverages.  Avoid greasy or fatty foods.  Reducing Reflux Symptoms (GERD):  Eat smaller meals and avoid snacks for 2 hours before sleeping. Avoid the following foods, which tend to aggravate heartburn and stomach problems: fatty/greasy foods, spicy foods, caffeinated beverages, mints, and chocolate.  Call Back If:  Abdominal pain is constant and present for more than 2 hours.  You become worse.  Appointment Scheduled:  05/05/2012 11:00:00 Appointment Scheduled Provider:  Oliver Barre (Adults only)

## 2012-05-05 ENCOUNTER — Other Ambulatory Visit (INDEPENDENT_AMBULATORY_CARE_PROVIDER_SITE_OTHER): Payer: Medicare Other

## 2012-05-05 ENCOUNTER — Ambulatory Visit (INDEPENDENT_AMBULATORY_CARE_PROVIDER_SITE_OTHER): Payer: Medicare Other | Admitting: Internal Medicine

## 2012-05-05 ENCOUNTER — Encounter: Payer: Self-pay | Admitting: Internal Medicine

## 2012-05-05 VITALS — BP 144/90 | HR 92 | Temp 97.3°F | Ht 69.0 in | Wt 237.8 lb

## 2012-05-05 DIAGNOSIS — M25519 Pain in unspecified shoulder: Secondary | ICD-10-CM

## 2012-05-05 DIAGNOSIS — K219 Gastro-esophageal reflux disease without esophagitis: Secondary | ICD-10-CM

## 2012-05-05 DIAGNOSIS — R634 Abnormal weight loss: Secondary | ICD-10-CM

## 2012-05-05 DIAGNOSIS — E038 Other specified hypothyroidism: Secondary | ICD-10-CM

## 2012-05-05 DIAGNOSIS — M25511 Pain in right shoulder: Secondary | ICD-10-CM

## 2012-05-05 DIAGNOSIS — R52 Pain, unspecified: Secondary | ICD-10-CM

## 2012-05-05 DIAGNOSIS — R1013 Epigastric pain: Secondary | ICD-10-CM

## 2012-05-05 DIAGNOSIS — I10 Essential (primary) hypertension: Secondary | ICD-10-CM

## 2012-05-05 LAB — BASIC METABOLIC PANEL
BUN: 6 mg/dL (ref 6–23)
Chloride: 101 mEq/L (ref 96–112)
Glucose, Bld: 96 mg/dL (ref 70–99)
Potassium: 4 mEq/L (ref 3.5–5.1)

## 2012-05-05 LAB — POCT URINALYSIS DIPSTICK
Blood, UA: NEGATIVE
Glucose, UA: NEGATIVE
Leukocytes, UA: NEGATIVE
Nitrite, UA: NEGATIVE
Urobilinogen, UA: NEGATIVE

## 2012-05-05 LAB — HEPATIC FUNCTION PANEL
ALT: 28 U/L (ref 0–53)
Bilirubin, Direct: 0.1 mg/dL (ref 0.0–0.3)
Total Bilirubin: 0.7 mg/dL (ref 0.3–1.2)

## 2012-05-05 LAB — CBC WITH DIFFERENTIAL/PLATELET
Eosinophils Absolute: 0.1 10*3/uL (ref 0.0–0.7)
Eosinophils Relative: 0.8 % (ref 0.0–5.0)
Lymphocytes Relative: 25.1 % (ref 12.0–46.0)
MCV: 86.7 fl (ref 78.0–100.0)
Monocytes Absolute: 0.5 10*3/uL (ref 0.1–1.0)
Neutrophils Relative %: 68.6 % (ref 43.0–77.0)
Platelets: 345 10*3/uL (ref 150.0–400.0)
WBC: 9.3 10*3/uL (ref 4.5–10.5)

## 2012-05-05 LAB — TSH: TSH: 1.13 u[IU]/mL (ref 0.35–5.50)

## 2012-05-05 MED ORDER — LANSOPRAZOLE 30 MG PO CPDR: 30.0000 mg | DELAYED_RELEASE_CAPSULE | Freq: Every day | ORAL | Status: DC

## 2012-05-05 NOTE — Patient Instructions (Addendum)
Please take all new medication as prescribed - the prevacid (generic) OK to stop the pepcid [facility] Please continue all other medications as before, and refills have been done if requested. Please have the pharmacy call with any other refills you may need. Please go to the LAB in the Basement (turn left off the elevator) for the tests to be done today You will be contacted by phone if any changes need to be made immediately.  Otherwise, you will receive a letter about your results with an explanation Please remember to sign up for My Chart if you have not done so, as this will be important to you in the future with finding out test results, communicating by private email, and scheduling acute appointments online when needed.

## 2012-05-06 ENCOUNTER — Encounter: Payer: Self-pay | Admitting: Internal Medicine

## 2012-05-06 DIAGNOSIS — M25511 Pain in right shoulder: Secondary | ICD-10-CM | POA: Insufficient documentation

## 2012-05-06 DIAGNOSIS — R634 Abnormal weight loss: Secondary | ICD-10-CM | POA: Insufficient documentation

## 2012-05-06 NOTE — Assessment & Plan Note (Signed)
C/w impingement like problem, declines orthopedic for now or consideration for steroid inj, mild, cont current meds,  to f/u any worsening symptoms or concerns

## 2012-05-06 NOTE — Assessment & Plan Note (Signed)
stable overall by history and exam, recent data reviewed with pt, and pt to continue medical treatment as before,  to f/u any worsening symptoms or concerns BP Readings from Last 3 Encounters:  05/05/12 144/90  04/05/12 162/92  01/02/12 173/91

## 2012-05-06 NOTE — Assessment & Plan Note (Signed)
Ok to change the pepcid [facility] to prevacid if ok with insurance, again pt declines GI referral

## 2012-05-06 NOTE — Progress Notes (Signed)
Subjective:    Patient ID: [name], male    DOB: Dec 01, 1955, 57 y.o.   MRN: [id number]  HPI  Here to f/u as new pt to me, pt of Dr Debby Bud with shizophrenia and somewhat difficult historian, c/o 3-4 wks worsening reflux symptoms and upper abd discomfort -  somewhat improved but not controlled by his pepcid ac;  Used to be on PPI  - med list indicates prior rx for prevacid and protonix last yr; pt denies red flags except for unintentional wt loss and decreased appetite since at least mar 2013, with documented wt loss from 260 - 237. Denies worsening depressive symptoms, suicidal ideation, or panic; has ongoing anxiety.  He is s/p numerous surguries, including per pt ventral hernia repair x 2, wondering if this would have something to do with his current discomfort.  Denies worsening dysphagia, n/v, bowel change or blood. Last GI appointment with Dr Brodie aug 2012, with prior 2012 CT abd, EGD and colonoscopy.  S/p ccx.  Pt requests TSH due to wt loss.  Also mentions several wks mild worsening right shoulder pain, impingement like with pain with limited abduction.   Past Medical History  Diagnosis Date  . Hyperlipidemia   . Hypertension   . Depression   . Allergy   . Anxiety   . ALLERGIC RHINITIS 01/25/2007  . ANXIETY DISORDER, GENERALIZED 01/25/2007  . DEPRESSION 01/25/2007  . ESOPHAGITIS 12/28/2007  . GERD 01/25/2007  . GLUCOSE INTOLERANCE, HX OF 01/25/2007  . HEMORRHOIDS, INTERNAL, WITH BLEEDING 04/26/2008  . HYPERLIPIDEMIA 12/27/2007  . HYPERTENSION, ESSENTIAL NOS 01/25/2007  . Hyperthyroidism 08/11/2010  . HYPERTROPHY PROSTATE W/UR OBST & OTH LUTS 02/27/2010  . 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 LOW BACK PAIN, CHRONIC 11/08/2007  . 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 SCOLIOSIS NEC 01/25/2007  . Sebaceous cyst 08/10/2010  . SMOKER 08/05/2009  . UPPER GASTROINTESTINAL HEMORRHAGE 01/25/2007  . Rectal abscess   . Pancreatitis   . Fatty liver 10/09/2010  . Hypokalemia   . Hiatal hernia    . Heart murmur     hx of   . Arthritis    Past Surgical History  Procedure Date  . Excision of thyroid mass   . Abdominal exploration surgery 1988  . Hernia repair     ventral  . Cholecystectomy 05/20/08    Dr. Abbey Chatters  . Excision of scalp lesion 07/2009  . Shoulder surgery     right  . Ganglion cyst excision     right foot x 2  . Incisional hernia repair 12/01/2011    Procedure: LAPAROSCOPIC INCISIONAL HERNIA;  Surgeon: Shelly Rubenstein, MD;  Location: [facility] OR;  Service: General;  Laterality: N/A;    reports that he has been smoking Cigarettes.  He has a 45 pack-year smoking history. He does not have any smokeless tobacco history on file. He reports that he does not drink alcohol or use illicit drugs. family history includes Hypertension in his father and mother and Prostate cancer in his other.  There is no history of Colon cancer. Allergies  Allergen Reactions  . Celecoxib     REACTION: rectal bleeding  . Iohexol     Affects nerves: triggers schizophrenia  . Metoclopramide Hcl     Affects nerves: triggers schizophrenia   Current Outpatient Prescriptions on File Prior to Visit  Medication Sig Dispense Refill  . amLODipine (NORVASC) 10 MG tablet Take 10 mg by mouth daily.      . diclofenac (VOLTAREN) 75 MG EC  tablet Take 1 tablet (75 mg total) by mouth 2 (two) times daily.  30 tablet  0  . KLOR-CON M20 20 MEQ tablet TAKE 1 TABLET BY MOUTH 2 TIMES DAILY  60 tablet  3  . acetaminophen (TYLENOL) 325 MG tablet Take 650 mg by mouth every 6 (six) hours as needed. For pain      . furosemide (LASIX) 40 MG tablet Take 1 tablet (40 mg total) by mouth daily.  30 tablet  11  . lansoprazole (PREVACID) 30 MG capsule Take 1 capsule (30 mg total) by mouth daily.  30 capsule  5  . promethazine (PHENERGAN) 25 MG tablet Take 1 tablet (25 mg total) by mouth every 6 (six) hours as needed for nausea.  12 tablet  0  . thiothixene (NAVANE) 5 MG capsule Take 15 mg by mouth at bedtime.        Review of Systems  Constitutional: Negative for unexpected weight change, or unusual diaphoresis  HENT: Negative for tinnitus.   Eyes: Negative for photophobia and visual disturbance.  Respiratory: Negative for choking and stridor.   Gastrointestinal: Negative for vomiting and blood in stool.  Genitourinary: Negative for hematuria and decreased urine volume.  Musculoskeletal: Negative for acute joint swelling Skin: Negative for color change and wound.  Neurological: Negative for tremors and numbness other than noted  Psychiatric/Behavioral: Negative for decreased concentration or  hyperactivity.      Objective:   Physical Exam BP 144/90  Pulse 92  Temp 97.3 F (36.3 C) (Oral)  Ht 5\' 9"  (1.753 m)  Wt 237 lb 12.8 oz (107.865 kg)  BMI 35.12 kg/m2  SpO2 98% VS noted, not ill appearing Constitutional: Pt appears well-developed and well-nourished.  HENT: Head: NCAT.  Right Ear: External ear normal.  Left Ear: External ear normal.  Eyes: Conjunctivae and EOM are normal. Pupils are equal, round, and reactive to light.  Neck: Normal range of motion. Neck supple.  Cardiovascular: Normal rate and regular rhythm.   Pulmonary/Chest: Effort normal and breath sounds normal.  Abd:  Soft, NT, non-distended, + BS Neurological: Pt is alert., UE motor/dtr intact  Skin: Skin is warm. No erythema.  Psychiatric: Pt behavior is normal. Thought content normal.  Right should with pain on passive abdution to 100 degrees    Assessment & Plan:

## 2012-05-06 NOTE — Assessment & Plan Note (Signed)
I think concerning as has been evident more recent, now with worsening of upper abd pain, but he declines further eval or GI referral at this time, except to check the TSH

## 2012-06-16 ENCOUNTER — Ambulatory Visit (INDEPENDENT_AMBULATORY_CARE_PROVIDER_SITE_OTHER)
Admission: RE | Admit: 2012-06-16 | Discharge: 2012-06-16 | Disposition: A | Discharge status: Home / Self Care | Payer: Medicare Other | Source: Ambulatory Visit | Attending: Internal Medicine | Admitting: Internal Medicine

## 2012-06-16 ENCOUNTER — Ambulatory Visit (INDEPENDENT_AMBULATORY_CARE_PROVIDER_SITE_OTHER): Payer: Medicare Other | Admitting: Internal Medicine

## 2012-06-16 ENCOUNTER — Other Ambulatory Visit: Payer: Self-pay | Admitting: Internal Medicine

## 2012-06-16 ENCOUNTER — Encounter: Payer: Self-pay | Admitting: Internal Medicine

## 2012-06-16 ENCOUNTER — Other Ambulatory Visit (INDEPENDENT_AMBULATORY_CARE_PROVIDER_SITE_OTHER): Payer: Medicare Other

## 2012-06-16 VITALS — BP 138/92 | HR 108 | Temp 99.1°F

## 2012-06-16 DIAGNOSIS — R110 Nausea: Secondary | ICD-10-CM

## 2012-06-16 DIAGNOSIS — R109 Unspecified abdominal pain: Secondary | ICD-10-CM

## 2012-06-16 DIAGNOSIS — R112 Nausea with vomiting, unspecified: Secondary | ICD-10-CM

## 2012-06-16 LAB — BASIC METABOLIC PANEL
Calcium: 9.8 mg/dL (ref 8.4–10.5)
Creatinine, Ser: 1.2 mg/dL (ref 0.4–1.5)
GFR: 83.15 mL/min (ref >60.00)

## 2012-06-16 LAB — CBC
HCT: 49 % (ref 39.0–52.0)
Hemoglobin: 16.2 g/dL (ref 13.0–17.0)
MCHC: 33 g/dL (ref 30.0–36.0)
RDW: 15.7 % — ABNORMAL HIGH (ref 11.5–14.6)

## 2012-06-16 LAB — LIPASE: Lipase: 24 U/L (ref 11.0–59.0)

## 2012-06-16 LAB — HEPATIC FUNCTION PANEL: Albumin: 4.1 g/dL (ref 3.5–5.2)

## 2012-06-16 LAB — AMYLASE: Amylase: 103 U/L (ref 27–131)

## 2012-06-16 MED ORDER — KETOROLAC TROMETHAMINE 60 MG/2ML IM SOLN
30.0000 mg | Freq: Once | INTRAMUSCULAR | Status: AC
  Administered 2012-06-16: 30 mg via INTRAMUSCULAR

## 2012-06-16 MED ORDER — ONDANSETRON HCL 4 MG PO TABS: 4.0000 mg | ORAL_TABLET | Freq: Three times a day (TID) | ORAL | Status: DC | PRN

## 2012-06-16 MED ORDER — PROMETHAZINE HCL 25 MG/ML IJ SOLN
25.0000 mg | Freq: Once | INTRAMUSCULAR | Status: AC
  Administered 2012-06-16: 25 mg via INTRAMUSCULAR

## 2012-06-16 MED ORDER — TRAMADOL HCL 50 MG PO TABS: 50.0000 mg | ORAL_TABLET | Freq: Three times a day (TID) | ORAL | Status: DC | PRN

## 2012-06-16 NOTE — Progress Notes (Addendum)
Subjective:    Patient ID: [name], male    DOB: [birth year], 56 y.o.   MRN: [id number]  HPI  Pt presents to the clinic today with c/o severe abdominal pain and vomiting x 2 days. He is unsure of what brought this on. He does not know if he is running fevers. He has not had any blood or bile in his vomitus. He has had one normal BM yesterday. The pain is generalized abdomen. He has not been able to keep anything down. He is passing flatus.  Review of Systems      Past Medical History  Diagnosis Date  . Hyperlipidemia   . Hypertension   . Depression   . Allergy   . Anxiety   . ALLERGIC RHINITIS 01/25/2007  . ANXIETY DISORDER, GENERALIZED 01/25/2007  . DEPRESSION 01/25/2007  . ESOPHAGITIS 12/28/2007  . GERD 01/25/2007  . GLUCOSE INTOLERANCE, HX OF 01/25/2007  . HEMORRHOIDS, INTERNAL, WITH BLEEDING 04/26/2008  . HYPERLIPIDEMIA 12/27/2007  . HYPERTENSION, ESSENTIAL NOS 01/25/2007  . Hyperthyroidism 08/11/2010  . HYPERTROPHY PROSTATE W/UR OBST & OTH LUTS 02/27/2010  . 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 LOW BACK PAIN, CHRONIC 11/08/2007  . 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 SCOLIOSIS NEC 01/25/2007  . Sebaceous cyst 08/10/2010  . SMOKER 08/05/2009  . UPPER GASTROINTESTINAL HEMORRHAGE 01/25/2007  . Rectal abscess   . Pancreatitis   . Fatty liver 10/09/2010  . Hypokalemia   . Hiatal hernia   . Heart murmur     hx of   . Arthritis     Current Outpatient Prescriptions  Medication Sig Dispense Refill  . acetaminophen (TYLENOL) 325 MG tablet Take 650 mg by mouth every 6 (six) hours as needed. For pain      . amLODipine (NORVASC) 10 MG tablet Take 10 mg by mouth daily.      . diclofenac (VOLTAREN) 75 MG EC tablet Take 1 tablet (75 mg total) by mouth 2 (two) times daily.  30 tablet  0  . furosemide (LASIX) 40 MG tablet Take 1 tablet (40 mg total) by mouth daily.  30 tablet  11  . KLOR-CON M20 20 MEQ tablet TAKE 1 TABLET BY MOUTH 2 TIMES DAILY  60 tablet  3  . lansoprazole  (PREVACID) 30 MG capsule Take 1 capsule (30 mg total) by mouth daily.  30 capsule  5  . promethazine (PHENERGAN) 25 MG tablet Take 1 tablet (25 mg total) by mouth every 6 (six) hours as needed for nausea.  12 tablet  0  . thiothixene (NAVANE) 5 MG capsule Take 15 mg by mouth at bedtime.       No current facility-administered medications for this visit.    Allergies  Allergen Reactions  . Celecoxib     REACTION: rectal bleeding  . Iohexol     Affects nerves: triggers schizophrenia  . Metoclopramide Hcl     Affects nerves: triggers schizophrenia    Family History  Problem Relation Age of Onset  . Hypertension Father   . Hypertension Mother   . Prostate cancer Other   . Colon cancer Neg Hx     History   Social History  . Marital Status: Married    Spouse Name: N/A    Number of Children: 2  . Years of Education: N/A   Occupational History  . Disabled    Social History Main Topics  . Smoking status: Current Every Day Smoker -- 1.50 packs/day for 30 years    Types: Cigarettes  .  Smokeless tobacco: Not on file  . Alcohol Use: No  . Drug Use: No  . Sexually Active: Yes -- Male partner(s)   Other Topics Concern  . Not on file   Social History Narrative   HSG disabled due to schizophrenia - 17. stable long-term marriage with children. cared for his father-in-law- passed away in 09/11/2022     Constitutional: Pt reports malaise and headache. Denies fever,  fatigue,  or abrupt weight changes.  Respiratory: Denies difficulty breathing, shortness of breath, cough or sputum production.   Cardiovascular: Denies chest pain, chest tightness, palpitations or swelling in the hands or feet.  Gastrointestinal: Pt reports severe abdominal pain, and vomiting. Denies bloating, constipation, diarrhea or blood in the stool.  GU: Pt reports burning with urination. Denies urgency, frequency, pain with urination, blood in urine, odor or discharge. Musculoskeletal: Pt reports body aches.  Denies decrease in range of motion, difficulty with gait, muscle pain or joint pain and swelling.    No other specific complaints in a complete review of systems (except as listed in HPI above).  Objective:   Physical Exam   BP 138/92  Pulse 108  Temp(Src) 99.1 F (37.3 C) (Oral)  SpO2 99% Wt Readings from Last 3 Encounters:  05/05/12 237 lb 12.8 oz (107.865 kg)  04/05/12 247 lb (112.038 kg)  12/23/11 246 lb (111.585 kg)    General: Appears their stated age, well developed, well nourished in NAD. Skin: Warm, dry and intact. No rashes, lesions or ulcerations noted. HEENT: Head: normal shape and size; Eyes: sclera white, no icterus, conjunctiva pink, PERRLA and EOMs intact; Ears: Tm's gray and intact, normal light reflex; Nose: mucosa pink and moist, septum midline; Throat/Mouth: Teeth present, mucosa pink and moist, no exudate, lesions or ulcerations noted.  Neck: Normal range of motion. Neck supple, trachea midline. No massses, lumps or thyromegaly present.  Cardiovascular: Normal rate and rhythm. S1,S2 noted.  No murmur, rubs or gallops noted. No JVD or BLE edema. No carotid bruits noted. Pulmonary/Chest: Normal effort and positive vesicular breath sounds. No respiratory distress. No wheezes, rales or ronchi noted.  Abdomen: Soft and generally tender. Normal bowel sounds, no bruits noted. Large reducible hernia, midline abdomen. Positive murphy's sigh. Pain with palpation of pancreas. Musculoskeletal: Normal range of motion. No signs of joint swelling. No difficulty with gait.        Assessment & Plan:   Sever generalized abdominal pain with vomiting, new onset with additional workup required:  Will obtain LFT's to r/o hepatitis or cirrhosis Will obtain amylase, lipase to r/o pancreatitis Stat CT of abdomen to r/o appendicitis, perforation or incarcerated hernia 25 mg phenergan IM now 30 mg Toradol IM now  Will f/u after CT scan results

## 2012-06-16 NOTE — Addendum Note (Signed)
Addended by: Carin Primrose on: 06/16/2012 11:38 AM   Modules accepted: Orders

## 2012-08-24 ENCOUNTER — Telehealth: Payer: Self-pay | Admitting: *Deleted

## 2012-08-24 NOTE — Telephone Encounter (Signed)
Pt wants to know if MD will prescribe generic Prolixin 10mg  for him. Pt states that [facility] is no longer taking his insurance, and he doesn't want to see a private psychiatrist. Pt has medication now, but wanted to call before he runs out in about a month. Pt is willing to come in for OV if needed. (MEN pt)

## 2012-08-24 NOTE — Telephone Encounter (Signed)
Very sorry, but this medication is outside of my "scope of practice", I would encourage the private psychiatrist

## 2012-08-25 NOTE — Telephone Encounter (Signed)
Pt is aware to call a psychiatrist to schedule for refills.

## 2012-08-25 NOTE — Telephone Encounter (Signed)
LM to call back.

## 2012-10-25 ENCOUNTER — Emergency Department (HOSPITAL_COMMUNITY)
Admission: EM | Admit: 2012-10-25 | Discharge: 2012-10-25 | Disposition: A | Discharge status: Home / Self Care | Payer: Medicare Other | Attending: Emergency Medicine | Admitting: Emergency Medicine

## 2012-10-25 ENCOUNTER — Encounter (HOSPITAL_COMMUNITY): Payer: Self-pay | Admitting: Family Medicine

## 2012-10-25 DIAGNOSIS — F209 Schizophrenia, unspecified: Secondary | ICD-10-CM | POA: Insufficient documentation

## 2012-10-25 DIAGNOSIS — Z8719 Personal history of other diseases of the digestive system: Secondary | ICD-10-CM | POA: Insufficient documentation

## 2012-10-25 DIAGNOSIS — K219 Gastro-esophageal reflux disease without esophagitis: Secondary | ICD-10-CM | POA: Insufficient documentation

## 2012-10-25 DIAGNOSIS — E876 Hypokalemia: Secondary | ICD-10-CM

## 2012-10-25 DIAGNOSIS — Z872 Personal history of diseases of the skin and subcutaneous tissue: Secondary | ICD-10-CM | POA: Insufficient documentation

## 2012-10-25 DIAGNOSIS — Z87448 Personal history of other diseases of urinary system: Secondary | ICD-10-CM | POA: Insufficient documentation

## 2012-10-25 DIAGNOSIS — Z8659 Personal history of other mental and behavioral disorders: Secondary | ICD-10-CM | POA: Insufficient documentation

## 2012-10-25 DIAGNOSIS — E0590 Thyrotoxicosis, unspecified without thyrotoxic crisis or storm: Secondary | ICD-10-CM | POA: Insufficient documentation

## 2012-10-25 DIAGNOSIS — Z862 Personal history of diseases of the blood and blood-forming organs and certain disorders involving the immune mechanism: Secondary | ICD-10-CM | POA: Insufficient documentation

## 2012-10-25 DIAGNOSIS — F17200 Nicotine dependence, unspecified, uncomplicated: Secondary | ICD-10-CM | POA: Insufficient documentation

## 2012-10-25 DIAGNOSIS — Z8739 Personal history of other diseases of the musculoskeletal system and connective tissue: Secondary | ICD-10-CM | POA: Insufficient documentation

## 2012-10-25 DIAGNOSIS — E785 Hyperlipidemia, unspecified: Secondary | ICD-10-CM | POA: Insufficient documentation

## 2012-10-25 DIAGNOSIS — Z8709 Personal history of other diseases of the respiratory system: Secondary | ICD-10-CM | POA: Insufficient documentation

## 2012-10-25 DIAGNOSIS — I10 Essential (primary) hypertension: Secondary | ICD-10-CM | POA: Insufficient documentation

## 2012-10-25 DIAGNOSIS — R109 Unspecified abdominal pain: Secondary | ICD-10-CM

## 2012-10-25 DIAGNOSIS — Z8639 Personal history of other endocrine, nutritional and metabolic disease: Secondary | ICD-10-CM | POA: Insufficient documentation

## 2012-10-25 DIAGNOSIS — Z79899 Other long term (current) drug therapy: Secondary | ICD-10-CM | POA: Insufficient documentation

## 2012-10-25 DIAGNOSIS — Z8679 Personal history of other diseases of the circulatory system: Secondary | ICD-10-CM | POA: Insufficient documentation

## 2012-10-25 LAB — URINALYSIS, ROUTINE W REFLEX MICROSCOPIC
Bilirubin Urine: NEGATIVE
Glucose, UA: NEGATIVE mg/dL
Hgb urine dipstick: NEGATIVE
Ketones, ur: 15 mg/dL — AB
Protein, ur: NEGATIVE mg/dL
pH: 8.5 — ABNORMAL HIGH (ref 5.0–8.0)

## 2012-10-25 LAB — CBC WITH DIFFERENTIAL/PLATELET
Eosinophils Absolute: 0 10*3/uL (ref 0.0–0.7)
Eosinophils Relative: 0 % (ref 0–5)
HCT: 44.6 % (ref 39.0–52.0)
Lymphocytes Relative: 16 % (ref 12–46)
Lymphs Abs: 1.7 10*3/uL (ref 0.7–4.0)
MCH: 30 pg (ref 26.0–34.0)
MCV: 81.7 fL (ref 78.0–100.0)
Monocytes Absolute: 0.4 10*3/uL (ref 0.1–1.0)
RBC: 5.46 MIL/uL (ref 4.22–5.81)
WBC: 11 10*3/uL — ABNORMAL HIGH (ref 4.0–10.5)

## 2012-10-25 LAB — COMPREHENSIVE METABOLIC PANEL
ALT: 12 U/L (ref 0–53)
AST: 13 U/L (ref 0–37)
Albumin: 4.4 g/dL (ref 3.5–5.2)
CO2: 27 mEq/L (ref 19–32)
Calcium: 10.3 mg/dL (ref 8.4–10.5)
Chloride: 99 mEq/L (ref 96–112)
Creatinine, Ser: 1.18 mg/dL (ref 0.50–1.35)
GFR calc non Af Amer: 68 mL/min — ABNORMAL LOW (ref >90)
Sodium: 141 mEq/L (ref 135–145)
Total Bilirubin: 0.5 mg/dL (ref 0.3–1.2)

## 2012-10-25 MED ORDER — POTASSIUM CHLORIDE CRYS ER 20 MEQ PO TBCR
40.0000 meq | EXTENDED_RELEASE_TABLET | Freq: Once | ORAL | Status: AC
  Administered 2012-10-25: 40 meq via ORAL
  Filled 2012-10-25: qty 2

## 2012-10-25 MED ORDER — POTASSIUM CHLORIDE ER 10 MEQ PO TBCR: 20.0000 meq | EXTENDED_RELEASE_TABLET | Freq: Two times a day (BID) | ORAL | Status: DC

## 2012-10-25 MED ORDER — POTASSIUM CHLORIDE 10 MEQ/100ML IV SOLN
10.0000 meq | INTRAVENOUS | Status: DC
  Administered 2012-10-25: 10 meq via INTRAVENOUS
  Filled 2012-10-25: qty 100

## 2012-10-25 MED ORDER — ONDANSETRON 4 MG PO TBDP
8.0000 mg | ORAL_TABLET | Freq: Once | ORAL | Status: AC
  Administered 2012-10-25: 8 mg via ORAL
  Filled 2012-10-25: qty 2

## 2012-10-25 MED ORDER — HYDROMORPHONE HCL PF 1 MG/ML IJ SOLN
1.0000 mg | Freq: Once | INTRAMUSCULAR | Status: AC
  Administered 2012-10-25: 1 mg via INTRAVENOUS
  Filled 2012-10-25: qty 1

## 2012-10-25 MED ORDER — SODIUM CHLORIDE 0.9 % IV BOLUS (SEPSIS)
1000.0000 mL | Freq: Once | INTRAVENOUS | Status: AC
  Administered 2012-10-25: 1000 mL via INTRAVENOUS

## 2012-10-25 MED ORDER — MAGNESIUM SULFATE 40 MG/ML IJ SOLN
2.0000 g | INTRAMUSCULAR | Status: AC
  Administered 2012-10-25: 2 g via INTRAVENOUS
  Filled 2012-10-25: qty 50

## 2012-10-25 NOTE — ED Notes (Signed)
Pt complaining of upper abdominal pain that started this am. sts N,V.

## 2012-10-25 NOTE — ED Provider Notes (Signed)
Pt improved, resting comfortably, abdomen soft and nontender Awaiting repeat K level after replenishing K while in the ED   Joya Gaskins, MD 10/25/12 [phone]

## 2012-10-25 NOTE — ED Provider Notes (Signed)
History    CSN: [id number] Arrival date & time 10/25/12  1313  First MD Initiated Contact with Patient 10/25/12 1531     Chief Complaint  Patient presents with  . Abdominal Pain   (Consider location/radiation/quality/duration/timing/severity/associated sxs/prior Treatment) HPI Comments: Patient presents emergency department with chief complaint of abdominal pain which started this morning. He states that it is associated with nausea and vomiting. He denies any blood in his vomit. He denies fevers or chills. He states that he has not taken any pain medicine because it makes him constipated. He states that he has had several prior abdominal surgeries.nothing makes the pain better or worse.  The history is provided by the patient. No language interpreter was used.   Past Medical History  Diagnosis Date  . Hyperlipidemia   . Hypertension   . Depression   . Allergy   . Anxiety   . ALLERGIC RHINITIS 01/25/2007  . ANXIETY DISORDER, GENERALIZED 01/25/2007  . DEPRESSION 01/25/2007  . ESOPHAGITIS 12/28/2007  . GERD 01/25/2007  . GLUCOSE INTOLERANCE, HX OF 01/25/2007  . HEMORRHOIDS, INTERNAL, WITH BLEEDING 04/26/2008  . HYPERLIPIDEMIA 12/27/2007  . HYPERTENSION, ESSENTIAL NOS 01/25/2007  . Hyperthyroidism 08/11/2010  . HYPERTROPHY PROSTATE W/UR OBST & OTH LUTS 02/27/2010  . 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 LOW BACK PAIN, CHRONIC 11/08/2007  . 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 SCOLIOSIS NEC 01/25/2007  . Sebaceous cyst 08/10/2010  . SMOKER 08/05/2009  . UPPER GASTROINTESTINAL HEMORRHAGE 01/25/2007  . Rectal abscess   . Pancreatitis   . Fatty liver 10/09/2010  . Hypokalemia   . Hiatal hernia   . Heart murmur     hx of   . Arthritis    Past Surgical History  Procedure Laterality Date  . Excision of thyroid mass    . Abdominal exploration surgery  1988  . Hernia repair      ventral  . Cholecystectomy  05/20/08    Dr. Abbey Chatters  . Excision of scalp lesion  07/2009  . Shoulder  surgery      right  . Ganglion cyst excision      right foot x 2  . Incisional hernia repair  12/01/2011    Procedure: LAPAROSCOPIC INCISIONAL HERNIA;  Surgeon: Shelly Rubenstein, MD;  Location: [facility] OR;  Service: General;  Laterality: N/A;   Family History  Problem Relation Age of Onset  . Hypertension Father   . Hypertension Mother   . Prostate cancer Other   . Colon cancer Neg Hx    History  Substance Use Topics  . Smoking status: Current Every Day Smoker -- 1.50 packs/day for 30 years    Types: Cigarettes  . Smokeless tobacco: Not on file  . Alcohol Use: No    Review of Systems  All other systems reviewed and are negative.    Allergies  Celecoxib; Iohexol; and Metoclopramide hcl  Home Medications   Current Outpatient Rx  Name  Route  Sig  Dispense  Refill  . acetaminophen (TYLENOL) 650 MG CR tablet   Oral   Take 1,300 mg by mouth every 8 (eight) hours as needed for pain.         [name] amLODipine (NORVASC) 10 MG tablet   Oral   Take 10 mg by mouth daily.         . fluPHENAZine (PROLIXIN) 10 MG tablet   Oral   Take 10 mg by mouth at bedtime.         . Ibuprofen (ADVIL)  200 MG CAPS   Oral   Take 200 mg by mouth daily as needed.         [name] omeprazole (PRILOSEC) 20 MG capsule   Oral   Take 20 mg by mouth daily.          BP 161/100  Pulse 101  Temp(Src) 98.4 F (36.9 C) (Oral)  Resp 12  SpO2 98% Physical Exam  Nursing note and vitals reviewed. Constitutional: He is oriented to person, place, and time. He appears well-developed and well-nourished.  HENT:  Head: Normocephalic and atraumatic.  Eyes: Conjunctivae and EOM are normal. Right eye exhibits no discharge. Left eye exhibits no discharge. No scleral icterus.  Neck: Normal range of motion. Neck supple. No JVD present.  Cardiovascular: Normal rate, regular rhythm and normal heart sounds.  Exam reveals no gallop and no friction rub.   No murmur heard. Pulmonary/Chest: Effort normal and breath  sounds normal. No respiratory distress. He has no wheezes. He has no rales. He exhibits no tenderness.  Abdominal: Soft. Bowel sounds are normal. He exhibits no distension and no mass. There is no tenderness. There is no rebound and no guarding.  Vague, diffuse, tenderness, not well-localized  Musculoskeletal: Normal range of motion. He exhibits no edema and no tenderness.  Neurological: He is alert and oriented to person, place, and time.  CN 3-12 intact  Skin: Skin is warm and dry.  Psychiatric: He has a normal mood and affect. His behavior is normal. Judgment and thought content normal.    ED Course  Procedures (including critical care time) Labs Reviewed  COMPREHENSIVE METABOLIC PANEL - Abnormal; Notable for the following:    Potassium 2.7 (*)    Glucose, Bld 130 (*)    GFR calc non Af Amer 68 (*)    GFR calc Af Amer 79 (*)    All other components within normal limits  CBC WITH DIFFERENTIAL - Abnormal; Notable for the following:    WBC 11.0 (*)    MCHC 36.8 (*)    Neutrophils Relative % 80 (*)    Neutro Abs 8.8 (*)    All other components within normal limits  LIPASE, BLOOD  URINALYSIS, ROUTINE W REFLEX MICROSCOPIC  LACTIC ACID, PLASMA  ED ECG REPORT  I personally interpreted this EKG   Date: 10/25/2012   Rate: 92  Rhythm: normal sinus rhythm  QRS Axis: normal  Intervals: normal  ST/T Wave abnormalities: normal  Conduction Disutrbances:none  Narrative Interpretation:   Old EKG Reviewed: unchanged   Results for orders placed during the hospital encounter of 10/25/12  COMPREHENSIVE METABOLIC PANEL      Result Value Range   Sodium 141  135 - 145 mEq/L   Potassium 2.7 (*) 3.5 - 5.1 mEq/L   Chloride 99  96 - 112 mEq/L   CO2 27  19 - 32 mEq/L   Glucose, Bld 130 (*) 70 - 99 mg/dL   BUN 8  6 - 23 mg/dL   Creatinine, Ser 1.19  0.50 - 1.35 mg/dL   Calcium 14.7  8.4 - 82.9 mg/dL   Total Protein 8.1  6.0 - 8.3 g/dL   Albumin 4.4  3.5 - 5.2 g/dL   AST 13  0 - 37 U/L   ALT  12  0 - 53 U/L   Alkaline Phosphatase 78  39 - 117 U/L   Total Bilirubin 0.5  0.3 - 1.2 mg/dL   GFR calc non Af Amer 68 (*) >90 mL/min   GFR  calc Af Amer 79 (*) >90 mL/min  LIPASE, BLOOD      Result Value Range   Lipase 28  11 - 59 U/L  CBC WITH DIFFERENTIAL      Result Value Range   WBC 11.0 (*) 4.0 - 10.5 K/uL   RBC 5.46  4.22 - 5.81 MIL/uL   Hemoglobin 16.4  13.0 - 17.0 g/dL   HCT 78.4  69.6 - 29.5 %   MCV 81.7  78.0 - 100.0 fL   MCH 30.0  26.0 - 34.0 pg   MCHC 36.8 (*) 30.0 - 36.0 g/dL   RDW 28.4  13.2 - 44.0 %   Platelets 357  150 - 400 K/uL   Neutrophils Relative % 80 (*) 43 - 77 %   Neutro Abs 8.8 (*) 1.7 - 7.7 K/uL   Lymphocytes Relative 16  12 - 46 %   Lymphs Abs 1.7  0.7 - 4.0 K/uL   Monocytes Relative 4  3 - 12 %   Monocytes Absolute 0.4  0.1 - 1.0 K/uL   Eosinophils Relative 0  0 - 5 %   Eosinophils Absolute 0.0  0.0 - 0.7 K/uL   Basophils Relative 0  0 - 1 %   Basophils Absolute 0.0  0.0 - 0.1 K/uL  URINALYSIS, ROUTINE W REFLEX MICROSCOPIC      Result Value Range   Color, Urine YELLOW  YELLOW   APPearance CLEAR  CLEAR   Specific Gravity, Urine 1.014  1.005 - 1.030   pH 8.5 (*) 5.0 - 8.0   Glucose, UA NEGATIVE  NEGATIVE mg/dL   Hgb urine dipstick NEGATIVE  NEGATIVE   Bilirubin Urine NEGATIVE  NEGATIVE   Ketones, ur 15 (*) NEGATIVE mg/dL   Protein, ur NEGATIVE  NEGATIVE mg/dL   Urobilinogen, UA 0.2  0.0 - 1.0 mg/dL   Nitrite NEGATIVE  NEGATIVE   Leukocytes, UA NEGATIVE  NEGATIVE  LACTIC ACID, PLASMA      Result Value Range   Lactic Acid, Venous 2.3 (*) 0.5 - 2.2 mmol/L  POTASSIUM      Result Value Range   Potassium 2.9 (*) 3.5 - 5.1 mEq/L   No results found.   1. Abdominal  pain, other specified site   2. Hypokalemia     MDM  Patient with abdominal pain. Is not well localized. Check basic labs and reevaluate.  3:57 PM Patient seen by and discussed with Dr. Bebe Shaggy.  7:30 PM Patient re-evaluated.  Seen by and discussed with Dr. Bebe Shaggy.   Abdomen is benign.  K is improving.  Discharge to home with K supplement, and PCP follow up.  Roxy Horseman, PA-C 10/25/12 2056

## 2012-10-26 ENCOUNTER — Telehealth: Payer: Self-pay

## 2012-10-26 NOTE — Telephone Encounter (Signed)
Phone call from patient's wife stating he was seen in the ED yesterday for vomiting and abdominal pain. He has a f/u appt next Wed. He is still not feeling well and wondering if he can be seen sooner than next Wednesday.

## 2012-10-26 NOTE — Telephone Encounter (Signed)
Monday works for me

## 2012-10-27 NOTE — Telephone Encounter (Signed)
Appt changed to 2:00 Mom July 14.

## 2012-10-27 NOTE — Telephone Encounter (Signed)
Please call patient to schedule an appt for Monday (per Dr Debby Bud) currently has an appt for Wednesday. thanks

## 2012-10-27 NOTE — Telephone Encounter (Signed)
LMOM to call to change the appt to Monday.

## 2012-10-28 NOTE — ED Provider Notes (Signed)
Medical screening examination/treatment/procedure(s) were conducted as a shared visit with non-physician practitioner(s) and myself.  I personally evaluated the patient during the encounter  Pt stable in the ED, he felt improved observation in the ED I doubt acute abdominal process at this time   Joya Gaskins, MD 10/28/12 1523

## 2012-10-30 ENCOUNTER — Encounter: Payer: Self-pay | Admitting: Internal Medicine

## 2012-10-30 ENCOUNTER — Other Ambulatory Visit (INDEPENDENT_AMBULATORY_CARE_PROVIDER_SITE_OTHER): Payer: Medicare Other

## 2012-10-30 ENCOUNTER — Ambulatory Visit (INDEPENDENT_AMBULATORY_CARE_PROVIDER_SITE_OTHER): Payer: Medicare Other | Admitting: Internal Medicine

## 2012-10-30 VITALS — BP 122/88 | HR 106 | Temp 98.0°F | Ht 69.0 in | Wt 236.0 lb

## 2012-10-30 DIAGNOSIS — E876 Hypokalemia: Secondary | ICD-10-CM

## 2012-10-30 DIAGNOSIS — F2089 Other schizophrenia: Secondary | ICD-10-CM

## 2012-10-30 DIAGNOSIS — T887XXA Unspecified adverse effect of drug or medicament, initial encounter: Secondary | ICD-10-CM

## 2012-10-30 DIAGNOSIS — I10 Essential (primary) hypertension: Secondary | ICD-10-CM

## 2012-10-30 DIAGNOSIS — R7309 Other abnormal glucose: Secondary | ICD-10-CM

## 2012-10-30 DIAGNOSIS — M545 Low back pain, unspecified: Secondary | ICD-10-CM

## 2012-10-30 DIAGNOSIS — M25519 Pain in unspecified shoulder: Secondary | ICD-10-CM

## 2012-10-30 DIAGNOSIS — M25511 Pain in right shoulder: Secondary | ICD-10-CM

## 2012-10-30 LAB — LIPID PANEL
Cholesterol: 209 mg/dL — ABNORMAL HIGH (ref 0–200)
HDL: 39.8 mg/dL (ref >39.00)
Triglycerides: 175 mg/dL — ABNORMAL HIGH (ref 0.0–149.0)
VLDL: 35 mg/dL (ref 0.0–40.0)

## 2012-10-30 LAB — HEMOGLOBIN A1C: Hgb A1c MFr Bld: 5.8 % (ref 4.6–6.5)

## 2012-10-30 MED ORDER — METHYLPREDNISOLONE ACETATE 40 MG/ML IJ SUSP
40.0000 mg | Freq: Once | INTRAMUSCULAR | Status: AC
  Administered 2012-10-30: 40 mg via INTRAMUSCULAR

## 2012-10-30 NOTE — Patient Instructions (Addendum)
Will check your potassium checked today. If still low will renew potassium Rx  Shoulder - hopefully the steroid injection will help  Slow stream - this is most likely enlargment of the prostate. If you have to get up too many times at night we can start medication.  Labs per Dr. Tomasa Rand - to be done today with results to him by fax and you by letter

## 2012-10-31 NOTE — Assessment & Plan Note (Signed)
BP Readings from Last 3 Encounters:  10/30/12 122/88  10/25/12 168/103  06/16/12 138/92   Good control today. Weight loss has helped. He has had recent BMET - normal   Plan Continue present medication

## 2012-10-31 NOTE — Progress Notes (Signed)
Subjective:    Patient ID: [name], male    DOB: 11/09/1955, 57 y.o.   MRN: [id number]  HPI Mr. Valli presents for right shoulder pain for which he feels a steroid injection will help.  He was seen 06/16/12 by Ms. Baity - normal labs, CT pelvis/abd unremarkable. Treated with toradol and phenergan. Seen July 9th in ED for abdominal pain: labs normal except for glucose of 130, EKG normal, lactic acid 2.3, K 2.7. He was given fluids and potassium with a d/c K = 2.9 and an Rx for potassium.  Since July 9th he has eliminated sodas and reports he is much better.  He also reports that he has been working out and has lost weight which has contributed to his feeling better. His med table has been adjusted by his new psychiatrist Dr. Tomasa Rand.  Past Medical History  Diagnosis Date  . Hyperlipidemia   . Hypertension   . Depression   . Allergy   . Anxiety   . ALLERGIC RHINITIS 01/25/2007  . ANXIETY DISORDER, GENERALIZED 01/25/2007  . DEPRESSION 01/25/2007  . ESOPHAGITIS 12/28/2007  . GERD 01/25/2007  . GLUCOSE INTOLERANCE, HX OF 01/25/2007  . HEMORRHOIDS, INTERNAL, WITH BLEEDING 04/26/2008  . HYPERLIPIDEMIA 12/27/2007  . HYPERTENSION, ESSENTIAL NOS 01/25/2007  . Hyperthyroidism 08/11/2010  . HYPERTROPHY PROSTATE W/UR OBST & OTH LUTS 02/27/2010  . 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 LOW BACK PAIN, CHRONIC 11/08/2007  . 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 SCOLIOSIS NEC 01/25/2007  . Sebaceous cyst 08/10/2010  . SMOKER 08/05/2009  . UPPER GASTROINTESTINAL HEMORRHAGE 01/25/2007  . Rectal abscess   . Pancreatitis   . Fatty liver 10/09/2010  . Hypokalemia   . Hiatal hernia   . Heart murmur     hx of   . Arthritis    Past Surgical History  Procedure Laterality Date  . Excision of thyroid mass    . Abdominal exploration surgery  1988  . Hernia repair      ventral  . Cholecystectomy  05/20/08    Dr. Abbey Chatters  . Excision of scalp lesion  07/2009  . Shoulder surgery      right  .  Ganglion cyst excision      right foot x 2  . Incisional hernia repair  12/01/2011    Procedure: LAPAROSCOPIC INCISIONAL HERNIA;  Surgeon: Shelly Rubenstein, MD;  Location: [facility] OR;  Service: General;  Laterality: N/A;   Family History  Problem Relation Age of Onset  . Hypertension Father   . Hypertension Mother   . Prostate cancer Other   . Colon cancer Neg Hx    History   Social History  . Marital Status: Married    Spouse Name: N/A    Number of Children: 2  . Years of Education: N/A   Occupational History  . Disabled    Social History Main Topics  . Smoking status: Current Every Day Smoker -- 1.50 packs/day for 30 years    Types: Cigarettes  . Smokeless tobacco: Not on file  . Alcohol Use: No  . Drug Use: No  . Sexually Active: Yes -- Male partner(s)   Other Topics Concern  . Not on file   Social History Narrative   HSG disabled due to schizophrenia - 67. stable long-term marriage with children. cared for his father-in-law- passed away in 09-03-2022    Current Outpatient Prescriptions on File Prior to Visit  Medication Sig Dispense Refill  . acetaminophen (TYLENOL) 650 MG CR tablet Take  1,300 mg by mouth every 8 (eight) hours as needed for pain.      [name] amLODipine (NORVASC) 10 MG tablet Take 10 mg by mouth daily.      . fluPHENAZine (PROLIXIN) 10 MG tablet Take 10 mg by mouth at bedtime.      . Ibuprofen (ADVIL) 200 MG CAPS Take 200 mg by mouth daily as needed.      [name] omeprazole (PRILOSEC) 20 MG capsule Take 20 mg by mouth daily.      . potassium chloride (K-DUR) 10 MEQ tablet Take 2 tablets (20 mEq total) by mouth 2 (two) times daily.  14 tablet  0   No current facility-administered medications on file prior to visit.     Review of Systems System review is negative for any constitutional, cardiac, pulmonary, GI or neuro symptoms or complaints other than as described in the HPI.     Objective:   Physical Exam Filed Vitals:   10/30/12 1403  BP: 122/88  Pulse:  106  Temp: 98 F (36.7 C)   Wt Readings from Last 3 Encounters:  10/30/12 236 lb (107.049 kg)  05/05/12 237 lb 12.8 oz (107.865 kg)  04/05/12 247 lb (112.038 kg)   gen'l - a WNWD AA man who looks more trim than I have seen in several years. HEENT- normal Cor- RRR Pulm - normal respirations MSK - full active ROM right shoulder with minor crepitus.  Procedure Jointbursal injection  Indication - localized pain right shoulder Consent - informed verbal consent from patient after explanation of risks of bleeding and infection Prep - injection site identified, prepped with betadine followed by alcohol. Med -  40  Mg depomedrol with 0.5 Cc 2% xylocain Injection - bursa/joint space right shoulder entered easily from lateral approach. Injected without difficulty. Patient tolerated this well. Post-procedure - patient with rapid reduction in discomfort. Bandaid applied. Routine precautions provided including instruction to return for fever, drainage or increased pain        Assessment & Plan:

## 2012-10-31 NOTE — Assessment & Plan Note (Signed)
Complains of crepitus and pain in the right shoulder but he has well preserved ROM.  Plan Intra-articular steroid injection given with good results.

## 2012-10-31 NOTE — Assessment & Plan Note (Signed)
By his report his low back pain is much better - attributed to weight loss and exercise.

## 2012-10-31 NOTE — Assessment & Plan Note (Signed)
Now seeing Dr. Tomasa Rand. He reports his med table has been changed and he is doing much better. HE also attributes doing better to abstaining from caffeine, reducing sugar and loosing weight.

## 2012-11-01 ENCOUNTER — Ambulatory Visit: Payer: Medicare Other | Admitting: Internal Medicine

## 2012-11-04 ENCOUNTER — Encounter: Payer: Self-pay | Admitting: Internal Medicine

## 2012-11-04 ENCOUNTER — Other Ambulatory Visit: Payer: Self-pay | Admitting: Internal Medicine

## 2012-11-04 MED ORDER — POTASSIUM CHLORIDE ER 20 MEQ PO TBCR: 20.0000 meq | EXTENDED_RELEASE_TABLET | Freq: Every day | ORAL | Status: DC

## 2012-11-06 ENCOUNTER — Telehealth: Payer: Self-pay

## 2012-11-06 NOTE — Telephone Encounter (Signed)
Patient notified via voicemail.

## 2012-11-06 NOTE — Telephone Encounter (Signed)
Message copied by Noreene Larsson on Mon Nov 06, 2012  9:16 AM ------      Message from: Jacques Navy      Created: Sat Nov 04, 2012  6:33 PM       Potassium is too low. He should take potassium tabs - Rx sent to pharmacy            Thanks ------

## 2012-11-08 ENCOUNTER — Other Ambulatory Visit: Payer: Self-pay | Admitting: Internal Medicine

## 2012-11-14 ENCOUNTER — Telehealth: Payer: Self-pay | Admitting: Internal Medicine

## 2012-11-14 DIAGNOSIS — Q181 Preauricular sinus and cyst: Secondary | ICD-10-CM

## 2012-11-14 DIAGNOSIS — E876 Hypokalemia: Secondary | ICD-10-CM

## 2012-11-14 NOTE — Telephone Encounter (Signed)
Pt is almost out of Potassium pills.  He had 1-2 left.  Will he need a refill.  When will he need a follow up?  Pt also has a cyst on his ear.  He wants to be referred for this.

## 2012-11-14 NOTE — Telephone Encounter (Signed)
1. Renew potassium 2. Metabolic panel soon 3. Referral entered for derm consult

## 2012-11-15 MED ORDER — POTASSIUM CHLORIDE ER 20 MEQ PO TBCR: 20.0000 meq | EXTENDED_RELEASE_TABLET | Freq: Every day | ORAL | Status: DC

## 2012-11-15 NOTE — Telephone Encounter (Signed)
Phone call to patient to let him know I am sending his potassium prescription to [facility] on [address]. I put in the order for metabolic panel. He was advised a referral was placed for dermatology appt.

## 2012-11-16 ENCOUNTER — Telehealth: Payer: Self-pay | Admitting: Internal Medicine

## 2012-11-16 MED ORDER — CEPHALEXIN 500 MG PO CAPS: 500.0000 mg | ORAL_CAPSULE | Freq: Four times a day (QID) | ORAL | Status: DC

## 2012-11-16 NOTE — Telephone Encounter (Signed)
Needs to see dermatologist. OK for keflex generic 500 mg qid x 7 days

## 2012-11-16 NOTE — Telephone Encounter (Signed)
Patient notified and states he has an appt on the 26th to see his dermatologist. I let him know I will send Keflex to the [facility] on [address]

## 2012-11-16 NOTE — Telephone Encounter (Signed)
Pt squeezed the cyst yesterday and it burst.  It has filled back up.  He wants some antibiotics called in. [facility] [address]

## 2013-02-20 ENCOUNTER — Ambulatory Visit: Payer: Medicare Other | Admitting: Internal Medicine

## 2013-03-12 ENCOUNTER — Encounter (HOSPITAL_COMMUNITY): Payer: Self-pay | Admitting: Emergency Medicine

## 2013-03-12 ENCOUNTER — Emergency Department (HOSPITAL_COMMUNITY): Payer: Medicare Other

## 2013-03-12 ENCOUNTER — Emergency Department (HOSPITAL_COMMUNITY)
Admission: EM | Admit: 2013-03-12 | Discharge: 2013-03-12 | Disposition: A | Discharge status: Home / Self Care | Payer: Medicare Other | Attending: Emergency Medicine | Admitting: Emergency Medicine

## 2013-03-12 DIAGNOSIS — M129 Arthropathy, unspecified: Secondary | ICD-10-CM | POA: Insufficient documentation

## 2013-03-12 DIAGNOSIS — G8929 Other chronic pain: Secondary | ICD-10-CM | POA: Insufficient documentation

## 2013-03-12 DIAGNOSIS — Z8659 Personal history of other mental and behavioral disorders: Secondary | ICD-10-CM | POA: Insufficient documentation

## 2013-03-12 DIAGNOSIS — I10 Essential (primary) hypertension: Secondary | ICD-10-CM | POA: Insufficient documentation

## 2013-03-12 DIAGNOSIS — Z8639 Personal history of other endocrine, nutritional and metabolic disease: Secondary | ICD-10-CM | POA: Insufficient documentation

## 2013-03-12 DIAGNOSIS — Z872 Personal history of diseases of the skin and subcutaneous tissue: Secondary | ICD-10-CM | POA: Insufficient documentation

## 2013-03-12 DIAGNOSIS — Z87448 Personal history of other diseases of urinary system: Secondary | ICD-10-CM | POA: Insufficient documentation

## 2013-03-12 DIAGNOSIS — K219 Gastro-esophageal reflux disease without esophagitis: Secondary | ICD-10-CM | POA: Insufficient documentation

## 2013-03-12 DIAGNOSIS — R011 Cardiac murmur, unspecified: Secondary | ICD-10-CM | POA: Insufficient documentation

## 2013-03-12 DIAGNOSIS — Z862 Personal history of diseases of the blood and blood-forming organs and certain disorders involving the immune mechanism: Secondary | ICD-10-CM | POA: Insufficient documentation

## 2013-03-12 DIAGNOSIS — Z79899 Other long term (current) drug therapy: Secondary | ICD-10-CM | POA: Insufficient documentation

## 2013-03-12 DIAGNOSIS — F17200 Nicotine dependence, unspecified, uncomplicated: Secondary | ICD-10-CM | POA: Insufficient documentation

## 2013-03-12 DIAGNOSIS — M25519 Pain in unspecified shoulder: Secondary | ICD-10-CM | POA: Insufficient documentation

## 2013-03-12 LAB — CBC
HCT: 44.1 % (ref 39.0–52.0)
Hemoglobin: 15.8 g/dL (ref 13.0–17.0)
MCH: 30.1 pg (ref 26.0–34.0)
MCHC: 35.8 g/dL (ref 30.0–36.0)
MCV: 84 fL (ref 78.0–100.0)

## 2013-03-12 LAB — BASIC METABOLIC PANEL
BUN: 8 mg/dL (ref 6–23)
Calcium: 9.4 mg/dL (ref 8.4–10.5)
Creatinine, Ser: 1.25 mg/dL (ref 0.50–1.35)
GFR calc Af Amer: 73 mL/min — ABNORMAL LOW (ref >90)
GFR calc non Af Amer: 63 mL/min — ABNORMAL LOW (ref >90)
Glucose, Bld: 96 mg/dL (ref 70–99)
Sodium: 136 mEq/L (ref 135–145)

## 2013-03-12 LAB — POCT I-STAT TROPONIN I

## 2013-03-12 MED ORDER — HYDROCODONE-ACETAMINOPHEN 5-325 MG PO TABS
1.0000 | ORAL_TABLET | Freq: Once | ORAL | Status: AC
  Administered 2013-03-12: 1 via ORAL
  Filled 2013-03-12: qty 1

## 2013-03-12 MED ORDER — HYDROCODONE-ACETAMINOPHEN 5-325 MG PO TABS: 1.0000 | ORAL_TABLET | Freq: Four times a day (QID) | ORAL | Status: DC | PRN

## 2013-03-12 MED ORDER — IBUPROFEN 800 MG PO TABS: 800.0000 mg | ORAL_TABLET | Freq: Three times a day (TID) | ORAL | Status: DC | PRN

## 2013-03-12 MED ORDER — KETOROLAC TROMETHAMINE 60 MG/2ML IM SOLN
60.0000 mg | Freq: Once | INTRAMUSCULAR | Status: AC
  Administered 2013-03-12: 60 mg via INTRAMUSCULAR
  Filled 2013-03-12: qty 2

## 2013-03-12 NOTE — ED Notes (Addendum)
Pt. Reports right sharp shoulder pain with radiation to right chest and right arm. Tender to palpation.  No deformity noted. Denies numbness/tingling in right arm. Pt. Reports having problem for years, states "I normally just go to the doctor, but they just give me cortisone shots and send me home and it doesn't help". Hx of surgery on shoulder. Reports pain with movement and turning head. Pt. Alert and oriented x4. Denies SOB, N/V, no diaphoresis. Pt.

## 2013-03-12 NOTE — ED Notes (Signed)
Pt. reports intermittent right chest and right shoulder pain onset today , denies injury , no SOB /nausea or diaphoresis .

## 2013-03-12 NOTE — ED Provider Notes (Signed)
CSN: [id number]     Arrival date & time 03/12/13  1947 History   First MD Initiated Contact with Patient 03/12/13 2115     Chief Complaint  Patient presents with  . Chest Pain   (Consider location/radiation/quality/duration/timing/severity/associated sxs/prior Treatment) Patient is a 57 y.o. male presenting with chest pain.  Chest Pain  Patient presents to the Emergency Department complaining of right shoulder pain that radiates into his neck, back, and along the anterior-lateral aspect of his right chest. He describes the pain as sharp and shooting. Patient states that he has had this pain for 3-4 years and has had prior surgery on this shoulder as well. He reports that this pain got worse yesterday and rates it as a 10/10 in severity. He denies recent trauma to the shoulder. He has taken aleve and advil without relief. He denies any chest pain, difficulty breathing, numbness in his arm, tingling in his arm, fever, cough, shortness of breath and loss of strength.   Past Medical History  Diagnosis Date  . Hyperlipidemia   . Hypertension   . Depression   . Allergy   . Anxiety   . ALLERGIC RHINITIS 01/25/2007  . ANXIETY DISORDER, GENERALIZED 01/25/2007  . DEPRESSION 01/25/2007  . ESOPHAGITIS 12/28/2007  . GERD 01/25/2007  . GLUCOSE INTOLERANCE, HX OF 01/25/2007  . HEMORRHOIDS, INTERNAL, WITH BLEEDING 04/26/2008  . HYPERLIPIDEMIA 12/27/2007  . HYPERTENSION, ESSENTIAL NOS 01/25/2007  . Hyperthyroidism 08/11/2010  . HYPERTROPHY PROSTATE W/UR OBST & OTH LUTS 02/27/2010  . 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 LOW BACK PAIN, CHRONIC 11/08/2007  . 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 SCOLIOSIS NEC 01/25/2007  . Sebaceous cyst 08/10/2010  . SMOKER 08/05/2009  . UPPER GASTROINTESTINAL HEMORRHAGE 01/25/2007  . Rectal abscess   . Pancreatitis   . Fatty liver 10/09/2010  . Hypokalemia   . Hiatal hernia   . Heart murmur     hx of   . Arthritis    Past Surgical History  Procedure Laterality  Date  . Excision of thyroid mass    . Abdominal exploration surgery  1988  . Hernia repair      ventral  . Cholecystectomy  05/20/08    Dr. Abbey Chatters  . Excision of scalp lesion  07/2009  . Shoulder surgery      right  . Ganglion cyst excision      right foot x 2  . Incisional hernia repair  12/01/2011    Procedure: LAPAROSCOPIC INCISIONAL HERNIA;  Surgeon: Shelly Rubenstein, MD;  Location: [facility] OR;  Service: General;  Laterality: N/A;   Family History  Problem Relation Age of Onset  . Hypertension Father   . Hypertension Mother   . Prostate cancer Other   . Colon cancer Neg Hx    History  Substance Use Topics  . Smoking status: Current Every Day Smoker -- 1.50 packs/day for 30 years    Types: Cigarettes  . Smokeless tobacco: Not on file  . Alcohol Use: No    Review of SystemsAll other systems negative except as documented in the HPI. All pertinent positives and negatives as reviewed in the HPI.   Allergies  Celecoxib; Iohexol; and Metoclopramide hcl  Home Medications   Current Outpatient Rx  Name  Route  Sig  Dispense  Refill  . acetaminophen (TYLENOL) 650 MG CR tablet   Oral   Take 1,300 mg by mouth every 8 (eight) hours as needed for pain.         [name]  amLODipine (NORVASC) 10 MG tablet   Oral   Take 10 mg by mouth daily.         . benztropine (COGENTIN) 0.5 MG tablet   Oral   Take 1 mg by mouth daily.         . fluPHENAZine (PROLIXIN) 10 MG tablet   Oral   Take 10 mg by mouth at bedtime.         . Ibuprofen (ADVIL) 200 MG CAPS   Oral   Take 200 mg by mouth daily as needed.         [name] KLOR-CON M20 20 MEQ tablet   Oral   Take 20 mEq by mouth every evening.         [name] omeprazole (PRILOSEC) 20 MG capsule   Oral   Take 20 mg by mouth daily.          BP 144/74  Pulse 92  Temp(Src) 98 F (36.7 C) (Oral)  Resp 23  Wt 233 lb 7 oz (105.887 kg)  SpO2 100% Physical Exam  Nursing note and vitals reviewed. Constitutional: He is oriented to  person, place, and time. He appears well-developed and well-nourished. No distress.  HENT:  Head: Normocephalic and atraumatic.  Mouth/Throat: Oropharynx is clear and moist.  Cardiovascular: Normal rate and regular rhythm.   Murmur heard. Pulmonary/Chest: Effort normal. No respiratory distress. He has wheezes. He has no rales.  Abdominal: Soft. Bowel sounds are normal. He exhibits no distension. There is tenderness. There is no rebound and no guarding.  Epigastric tenderness  Musculoskeletal: He exhibits tenderness. He exhibits no edema.  Full ROM in upper extremities bilaterally. 5/5 strength is upper extremities bilaterally. Tenderness to palpation along bony prominences of right shoulder and surrounding muscles.   Neurological: He is alert and oriented to person, place, and time.  Skin: Skin is warm and dry. No rash noted.    ED Course  Procedures (including critical care time) Labs Review Labs Reviewed  BASIC METABOLIC PANEL - Abnormal; Notable for the following:    Potassium 3.2 (*)    GFR calc non Af Amer 63 (*)    GFR calc Af Amer 73 (*)    All other components within normal limits  CBC  POCT I-STAT TROPONIN I   Imaging Review Dg Chest 2 View  03/12/2013   CLINICAL DATA:  Right chest pain. Right shoulder and neck pain. Cough.  EXAM: CHEST  2 VIEW  COMPARISON:  11/26/2011 ; 01/02/2012.  FINDINGS: Clips at the thoracic inlet likely from thyroidectomy. The lungs appear clear. The cardiac and mediastinal contours normal. No pleural effusion identified. Slight anterior mediastinal prominence the lateral projection has been present since 2010 and accordingly is probably incidental.  IMPRESSION: 1. No significant abnormality identified.   Electronically Signed   By: Herbie Baltimore M.D.   On: 03/12/2013 20:50    EKG Interpretation   None      Patient is complaining of right lateral chest pain, that is radiating from his posterior shoulder and shoulder blade area.  Patient,  states the pain also radiates to the lateral neck and deltoid area.  Patient does not have any signs of cardiac chest pain, based on his history of present illness and physical exam, along with this testing here tonight.  The patient is advised to return here as needed.  Also advised him to followup with his primary care Dr. and he'll be referred to orthopedics for his shoulder and upper back pain.  Date: 03/12/2013  Rate: 84  Rhythm: normal sinus rhythm  QRS Axis: normal  Intervals: normal  ST/T Wave abnormalities: normal  Conduction Disutrbances:none  Narrative Interpretation:   Old EKG Reviewed: unchanged    Carlyle Dolly, PA-C 03/12/13 2154

## 2013-03-13 NOTE — ED Provider Notes (Signed)
Medical screening examination/treatment/procedure(s) were performed by non-physician practitioner and as supervising physician I was immediately available for consultation/collaboration.  EKG Interpretation   None        Maedell Hedger F Cullan Launer, MD 03/13/13 1041 

## 2013-07-12 ENCOUNTER — Ambulatory Visit: Payer: Medicare Other | Admitting: Internal Medicine

## 2013-07-18 ENCOUNTER — Other Ambulatory Visit: Payer: Medicare Other

## 2013-07-18 ENCOUNTER — Encounter: Payer: Self-pay | Admitting: Internal Medicine

## 2013-07-18 ENCOUNTER — Ambulatory Visit (INDEPENDENT_AMBULATORY_CARE_PROVIDER_SITE_OTHER): Payer: Medicare Other | Admitting: Internal Medicine

## 2013-07-18 ENCOUNTER — Telehealth: Payer: Self-pay | Admitting: Internal Medicine

## 2013-07-18 VITALS — BP 140/100 | HR 95 | Temp 98.3°F | Resp 14 | Wt 234.8 lb

## 2013-07-18 DIAGNOSIS — I10 Essential (primary) hypertension: Secondary | ICD-10-CM

## 2013-07-18 DIAGNOSIS — N529 Male erectile dysfunction, unspecified: Secondary | ICD-10-CM

## 2013-07-18 DIAGNOSIS — M545 Low back pain, unspecified: Secondary | ICD-10-CM

## 2013-07-18 DIAGNOSIS — R300 Dysuria: Secondary | ICD-10-CM

## 2013-07-18 DIAGNOSIS — E876 Hypokalemia: Secondary | ICD-10-CM

## 2013-07-18 LAB — POCT URINALYSIS DIPSTICK
Blood, UA: NEGATIVE
Glucose, UA: NEGATIVE
Ketones, UA: NEGATIVE
NITRITE UA: NEGATIVE
SPEC GRAV UA: 1.015
UROBILINOGEN UA: 0.2
pH, UA: 6.5

## 2013-07-18 MED ORDER — SILDENAFIL CITRATE 20 MG PO TABS: ORAL_TABLET | ORAL | Status: DC

## 2013-07-18 MED ORDER — SPIRONOLACTONE 25 MG PO TABS: 25.0000 mg | ORAL_TABLET | Freq: Every day | ORAL | Status: DC

## 2013-07-18 NOTE — Progress Notes (Signed)
   Subjective:    Patient ID: [name], male    DOB: December 25, 1955, 58 y.o.   MRN: [id number]  HPI He is here for exacerbation of a chronic low back injury from 37 years ago.  3 days ago he feels the pain worsened; he describes the pain as constant and dull.  The pain radiates into his hips and thighs as paresthesia to the knees.   He reports alleviating factors as warm bath; aggravated by movement or position changes.   He expresses 2 other concerns today:  1. Inability to hold erection 2. Burning sensation in epigastric area during urination; reports infrequent burning with urination.   His PMH is significant for schizophrenia for which he takes Proloxin daily. He reports compliance with medications.     Review of Systems Denies significant  dyspepsia, dysphagia, unexplained weight loss, melena, rectal bleeding, or small caliber stools. Denies change in stools such as diarrhea or constipation denied; he denies loss of control of urine or bowels.  Denies dysuria, hematuria or flank pain are absent There is no associated back pain with radiation anteriorly. There is no associated change in color or temperature of the skin in area of symptoms. There is no associated rash present. There is no bruising or bleeding present.      Objective:   Physical Exam General appearance is of  and nourishment w/o distress. Eyes: eyes injected, watery bilaterally; scleral icterus is present. Oral exam: poor dentition, missing teeth; lips and gums moist mucosa.There is no oropharyngeal erythema or exudate noted.  Heart:  Normal rate and regular rhythm. S1 and S2 normal without gallop, murmur, click, rub or other extra sounds.   Lungs:Chest clear to auscultation; no wheezes, rhonchi,rales ,or rubs present.No increased work of breathing.  Abdomen:hypoactive bowel sounds, soft, non-tender abdomen, ventral hernia; well-healed scars from gallbladder removal and past hernia repairs; no organomegaly.   No  guarding or rebound . No tenderness over the flanks to percussion. Musculoskeletal: Able to lie flat and sit up without help. Negative straight leg raising bilaterally. Gait normal. Heel walk elicits pain; toe walk normal; no classic low back crawl.  Skin:Warm & dry.  Intact without suspicious lesions or rashes ; no jaundice or tenting Lymphatic: No lymphadenopathy is noted about the head, neck, axillal areas.     Assessment & Plan:

## 2013-07-18 NOTE — Telephone Encounter (Signed)
Relevant patient education mailed to patient.  

## 2013-07-18 NOTE — Progress Notes (Signed)
   Subjective:    Patient ID: [name], male    DOB: [birth year], 58 y.o.   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  3 days ago he feels the pain worsened; he describes the pain as constant and dull.  The pain radiates into his hips and thighs as paresthesia to the knees.  He reports alleviating factors as warm bath; aggravated by movement or position changes.    He expresses 2 other concerns today:  1. Inability to hold erection  2. Burning sensation in epigastric area during urination; reports infrequent burning with urination.   His A1c was non diabetic.Binge smoking with significant consumption.  His PMH is significant for schizophrenia for which he takes Proloxin daily. He reports compliance with medications.    Review of Systems Denies significant dyspepsia, dysphagia, unexplained weight loss, melena, rectal bleeding, or small caliber stools.  Denies change in stools such as diarrhea or constipation denied; he denies loss of control of urine or bowels.  Denies dysuria, hematuria or flank pain are absent  There is no associated back pain with radiation anteriorly.  There is no associated change in color or temperature of the skin in area of symptoms.  There is no associated rash present.  There is no bruising or bleeding present.      Objective:   Physical Exam General appearance:weight excess,adequately nourished w/o distress.  Eyes: eyes injected, watery bilaterally; scleral icterus is present.  Oral exam: poor dentition, missing teeth; lips and gums moist mucosa.There is no oropharyngeal erythema or exudate noted.  Heart: Normal rate and regular rhythm. S1 and S2 normal without gallop, murmur, click, rub or other extra sounds.  Lungs:Chest clear to auscultation; no wheezes, rhonchi,rales ,or rubs present.No increased work of breathing.  Abdomen:hypoactive bowel sounds, soft, non-tender  abdomen,small  ventral hernia; well-healed scars from gallbladder removal and past hernia repairs; no organomegaly.  No guarding or rebound . No tenderness over the flanks to percussion.  Musculoskeletal: Able to lie flat and sit up without help. Negative straight leg raising bilaterally. Gait normal. Heel walk elicits pain;but no foot drop.Tip toe walk normal; no classic low back crawl. No CVA tenderness bilaterally.  Skin:Warm & dry. Intact without suspicious lesions or rashes ; no jaundice or tenting  Lymphatic: No lymphadenopathy is noted about the head, neck, axilla, or inguinal areas.      Assessment & Plan:  #1 LBP #2 pain at incisional ventral hernia  #3 ED #4 HTN See orders

## 2013-07-18 NOTE — Progress Notes (Signed)
Pre visit review using our clinic review tool, if applicable. No additional management support is needed unless otherwise documented below in the visit note. 

## 2013-07-18 NOTE — Patient Instructions (Addendum)
The best exercises for the low back include freestyle swimming, stretch aerobics, and yoga.Cybex & Nautilus machines rather than dead weights are better for the back. Alternatively cheaper therapeutically equivalent option for Viagra is available from  [employer] at [phone]. Minimal Blood Pressure Goal= AVERAGE < 140/90;  Ideal is an AVERAGE < 135/85. This AVERAGE should be calculated from @ least 5-7 BP readings taken @ different times of day on different days of week. You should not respond to isolated BP readings , but rather the AVERAGE for that week .Please bring your  blood pressure cuff to office visits to verify that it is reliable.It  can also be checked against the blood pressure device at the pharmacy. Finger or wrist cuffs are not dependable; an arm cuff is. To increase  Potassium (K+) increase citrus fruits & bananas in diet and use the salt substitute No Salt, which contains  potassium , to season food @ the table. Recheck K+ after 4 weeks . [employer]

## 2013-07-20 LAB — URINE CULTURE
COLONY COUNT: NO GROWTH
Organism ID, Bacteria: NO GROWTH

## 2013-07-22 ENCOUNTER — Emergency Department (HOSPITAL_COMMUNITY): Payer: Medicare Other

## 2013-07-22 ENCOUNTER — Emergency Department (HOSPITAL_COMMUNITY)
Admission: EM | Admit: 2013-07-22 | Discharge: 2013-07-23 | Disposition: A | Discharge status: Home / Self Care | Payer: Medicare Other | Attending: Emergency Medicine | Admitting: Emergency Medicine

## 2013-07-22 ENCOUNTER — Encounter (HOSPITAL_COMMUNITY): Payer: Self-pay | Admitting: Emergency Medicine

## 2013-07-22 DIAGNOSIS — Z9089 Acquired absence of other organs: Secondary | ICD-10-CM | POA: Insufficient documentation

## 2013-07-22 DIAGNOSIS — R011 Cardiac murmur, unspecified: Secondary | ICD-10-CM | POA: Insufficient documentation

## 2013-07-22 DIAGNOSIS — E860 Dehydration: Secondary | ICD-10-CM | POA: Insufficient documentation

## 2013-07-22 DIAGNOSIS — R000 Tachycardia, unspecified: Secondary | ICD-10-CM | POA: Insufficient documentation

## 2013-07-22 DIAGNOSIS — E876 Hypokalemia: Secondary | ICD-10-CM | POA: Insufficient documentation

## 2013-07-22 DIAGNOSIS — R109 Unspecified abdominal pain: Secondary | ICD-10-CM

## 2013-07-22 DIAGNOSIS — R1013 Epigastric pain: Secondary | ICD-10-CM | POA: Insufficient documentation

## 2013-07-22 DIAGNOSIS — Z87448 Personal history of other diseases of urinary system: Secondary | ICD-10-CM | POA: Insufficient documentation

## 2013-07-22 DIAGNOSIS — F17200 Nicotine dependence, unspecified, uncomplicated: Secondary | ICD-10-CM | POA: Insufficient documentation

## 2013-07-22 DIAGNOSIS — I10 Essential (primary) hypertension: Secondary | ICD-10-CM | POA: Insufficient documentation

## 2013-07-22 DIAGNOSIS — Z8719 Personal history of other diseases of the digestive system: Secondary | ICD-10-CM | POA: Insufficient documentation

## 2013-07-22 DIAGNOSIS — G8929 Other chronic pain: Secondary | ICD-10-CM | POA: Insufficient documentation

## 2013-07-22 DIAGNOSIS — Z8739 Personal history of other diseases of the musculoskeletal system and connective tissue: Secondary | ICD-10-CM | POA: Insufficient documentation

## 2013-07-22 DIAGNOSIS — Z79899 Other long term (current) drug therapy: Secondary | ICD-10-CM | POA: Insufficient documentation

## 2013-07-22 DIAGNOSIS — F2089 Other schizophrenia: Secondary | ICD-10-CM | POA: Insufficient documentation

## 2013-07-22 DIAGNOSIS — Z872 Personal history of diseases of the skin and subcutaneous tissue: Secondary | ICD-10-CM | POA: Insufficient documentation

## 2013-07-22 DIAGNOSIS — E785 Hyperlipidemia, unspecified: Secondary | ICD-10-CM | POA: Insufficient documentation

## 2013-07-22 DIAGNOSIS — Z9889 Other specified postprocedural states: Secondary | ICD-10-CM | POA: Insufficient documentation

## 2013-07-22 LAB — CBC WITH DIFFERENTIAL/PLATELET
BASOS PCT: 0 % (ref 0–1)
Basophils Absolute: 0 10*3/uL (ref 0.0–0.1)
Eosinophils Absolute: 0 10*3/uL (ref 0.0–0.7)
Eosinophils Relative: 0 % (ref 0–5)
HCT: 45.1 % (ref 39.0–52.0)
HEMOGLOBIN: 16.3 g/dL (ref 13.0–17.0)
Lymphocytes Relative: 14 % (ref 12–46)
Lymphs Abs: 3.1 10*3/uL (ref 0.7–4.0)
MCH: 30 pg (ref 26.0–34.0)
MCHC: 36.1 g/dL — ABNORMAL HIGH (ref 30.0–36.0)
MCV: 82.9 fL (ref 78.0–100.0)
MONOS PCT: 7 % (ref 3–12)
Monocytes Absolute: 1.6 10*3/uL — ABNORMAL HIGH (ref 0.1–1.0)
NEUTROS PCT: 79 % — AB (ref 43–77)
Neutro Abs: 17.8 10*3/uL — ABNORMAL HIGH (ref 1.7–7.7)
Platelets: 423 10*3/uL — ABNORMAL HIGH (ref 150–400)
RBC: 5.44 MIL/uL (ref 4.22–5.81)
RDW: 13.8 % (ref 11.5–15.5)
WBC: 22.6 10*3/uL — ABNORMAL HIGH (ref 4.0–10.5)

## 2013-07-22 LAB — COMPREHENSIVE METABOLIC PANEL
ALBUMIN: 4.6 g/dL (ref 3.5–5.2)
ALK PHOS: 91 U/L (ref 39–117)
ALT: 14 U/L (ref 0–53)
AST: 20 U/L (ref 0–37)
BUN: 12 mg/dL (ref 6–23)
CO2: 25 mEq/L (ref 19–32)
CREATININE: 1.51 mg/dL — AB (ref 0.50–1.35)
Calcium: 10.7 mg/dL — ABNORMAL HIGH (ref 8.4–10.5)
Chloride: 92 mEq/L — ABNORMAL LOW (ref 96–112)
GFR calc Af Amer: 58 mL/min — ABNORMAL LOW (ref >90)
GFR calc non Af Amer: 50 mL/min — ABNORMAL LOW (ref >90)
Glucose, Bld: 140 mg/dL — ABNORMAL HIGH (ref 70–99)
Potassium: 3.1 mEq/L — ABNORMAL LOW (ref 3.7–5.3)
Sodium: 137 mEq/L (ref 137–147)
Total Bilirubin: 0.7 mg/dL (ref 0.3–1.2)
Total Protein: 8.9 g/dL — ABNORMAL HIGH (ref 6.0–8.3)

## 2013-07-22 LAB — URINALYSIS, ROUTINE W REFLEX MICROSCOPIC
Bilirubin Urine: NEGATIVE
Glucose, UA: NEGATIVE mg/dL
Hgb urine dipstick: NEGATIVE
Ketones, ur: NEGATIVE mg/dL
LEUKOCYTES UA: NEGATIVE
NITRITE: NEGATIVE
PH: 5.5 (ref 5.0–8.0)
Protein, ur: 30 mg/dL — AB
SPECIFIC GRAVITY, URINE: 1.025 (ref 1.005–1.030)
Urobilinogen, UA: 0.2 mg/dL (ref 0.0–1.0)

## 2013-07-22 LAB — URINE MICROSCOPIC-ADD ON

## 2013-07-22 LAB — LIPASE, BLOOD: LIPASE: 35 U/L (ref 11–59)

## 2013-07-22 MED ORDER — ONDANSETRON HCL 4 MG/2ML IJ SOLN
4.0000 mg | Freq: Once | INTRAMUSCULAR | Status: AC
  Administered 2013-07-22: 4 mg via INTRAVENOUS
  Filled 2013-07-22: qty 2

## 2013-07-22 MED ORDER — POTASSIUM CHLORIDE CRYS ER 20 MEQ PO TBCR
30.0000 meq | EXTENDED_RELEASE_TABLET | Freq: Once | ORAL | Status: AC
  Administered 2013-07-23: 30 meq via ORAL
  Filled 2013-07-22: qty 1.5

## 2013-07-22 MED ORDER — SODIUM CHLORIDE 0.9 % IV SOLN
INTRAVENOUS | Status: DC
  Administered 2013-07-22: 22:00:00 via INTRAVENOUS

## 2013-07-22 MED ORDER — POTASSIUM CHLORIDE 10 MEQ/100ML IV SOLN: 10.0000 meq | Freq: Once | INTRAVENOUS | Status: DC

## 2013-07-22 MED ORDER — SODIUM CHLORIDE 0.9 % IV BOLUS (SEPSIS)
1000.0000 mL | Freq: Once | INTRAVENOUS | Status: AC
  Administered 2013-07-22: 1000 mL via INTRAVENOUS

## 2013-07-22 MED ORDER — HYDROMORPHONE HCL PF 1 MG/ML IJ SOLN
1.0000 mg | Freq: Once | INTRAMUSCULAR | Status: AC
  Administered 2013-07-22: 1 mg via INTRAVENOUS
  Filled 2013-07-22: qty 1

## 2013-07-22 NOTE — Discharge Instructions (Signed)
Abdominal Pain, Adult Many things can cause abdominal pain. Usually, abdominal pain is not caused by a disease and will improve without treatment. It can often be observed and treated at home. Your health care provider will do a physical exam and possibly order blood tests and X-rays to help determine the seriousness of your pain. However, in many cases, more time must pass before a clear cause of the pain can be found. Before that point, your health care provider may not know if you need more testing or further treatment. HOME CARE INSTRUCTIONS  Monitor your abdominal pain for any changes. The following actions may help to alleviate any discomfort you are experiencing:  Only take over-the-counter or prescription medicines as directed by your health care provider.  Do not take laxatives unless directed to do so by your health care provider.  Try a clear liquid diet (broth, tea, or water) as directed by your health care provider. Slowly move to a bland diet as tolerated. SEEK MEDICAL CARE IF:  You have unexplained abdominal pain.  You have abdominal pain associated with nausea or diarrhea.  You have pain when you urinate or have a bowel movement.  You experience abdominal pain that wakes you in the night.  You have abdominal pain that is worsened or improved by eating food.  You have abdominal pain that is worsened with eating fatty foods. SEEK IMMEDIATE MEDICAL CARE IF:   Your pain does not go away within 2 hours.  You have a fever.  You keep throwing up (vomiting).  Your pain is felt only in portions of the abdomen, such as the right side or the left lower portion of the abdomen.  You pass bloody or black tarry stools. MAKE SURE YOU:  Understand these instructions.   Will watch your condition.   Will get help right away if you are not doing well or get worse.  Document Released: 01/13/2005 Document Revised: 01/24/2013 Document Reviewed: 12/13/2012 [facility] Patient  Information 2014 [place]. Hypokalemia Hypokalemia means that the amount of potassium in the blood is lower than normal.Potassium is a chemical, called an electrolyte, that helps regulate the amount of fluid in the body. It also stimulates muscle contraction and helps nerves function properly.Most of the body's potassium is inside of cells, and only a very small amount is in the blood. Because the amount in the blood is so small, minor changes can be life-threatening. CAUSES  Antibiotics.  Diarrhea or vomiting.  Using laxatives too much, which can cause diarrhea.  Chronic kidney disease.  Water pills (diuretics).  Eating disorders (bulimia).  Low magnesium level.  Sweating a lot. SIGNS AND SYMPTOMS  Weakness.  Constipation.  Fatigue.  Muscle cramps.  Mental confusion.  Skipped heartbeats or irregular heartbeat (palpitations).  Tingling or numbness. DIAGNOSIS  Your health care provider can diagnose hypokalemia with blood tests. In addition to checking your potassium level, your health care provider may also check other lab tests. TREATMENT Hypokalemia can be treated with potassium supplements taken by mouth or adjustments in your current medicines. If your potassium level is very low, you may need to get potassium through a vein (IV) and be monitored in the hospital. A diet high in potassium is also helpful. Foods high in potassium are:  Nuts, such as peanuts and pistachios.  Seeds, such as sunflower seeds and pumpkin seeds.  Peas, lentils, and lima beans.  Whole grain and bran cereals and breads.  Fresh fruit and vegetables, such as apricots, avocado, bananas, cantaloupe,  kiwi, oranges, tomatoes, asparagus, and potatoes.  Orange and tomato juices.  Red meats.  Fruit yogurt. HOME CARE INSTRUCTIONS  Take all medicines as prescribed by your health care provider.  Maintain a healthy diet by including nutritious food, such as fruits, vegetables, nuts,  whole grains, and lean meats.  If you are taking a laxative, be sure to follow the directions on the label. SEEK MEDICAL CARE IF:  Your weakness gets worse.  You feel your heart pounding or racing.  You are vomiting or having diarrhea.  You are diabetic and having trouble keeping your blood glucose in the normal range. SEEK IMMEDIATE MEDICAL CARE IF:  You have chest pain, shortness of breath, or dizziness.  You are vomiting or having diarrhea for more than 2 days.  You faint. MAKE SURE YOU:   Understand these instructions.  Will watch your condition.  Will get help right away if you are not doing well or get worse. Document Released: 04/05/2005 Document Revised: 01/24/2013 Document Reviewed: 10/06/2012 [facility] Patient Information 2014 [place].

## 2013-07-22 NOTE — ED Notes (Addendum)
A urinal has been placed at the bed side.

## 2013-07-22 NOTE — ED Notes (Signed)
Pt reports that he has had increased upper abdominal pain after being seen by his PCP last week and he "mashed on" his previous area of a hernia, since then pt has been having increased pain, n/v/d. Pt reports x5 episodes of vomiting today. Pt has been told he needs to have another surgery for his hernia, and has been told that he has "something pressing on his bladder" causing abdominal burning with urination. Pt a&o x4, skin warm and dry, ambulatory to triage.

## 2013-07-22 NOTE — ED Provider Notes (Signed)
CSN: [id number]     Arrival date & time 07/22/13  1905 History   First MD Initiated Contact with Patient 07/22/13 2015     Chief Complaint  Patient presents with  . Abdominal Pain     (Consider location/radiation/quality/duration/timing/severity/associated sxs/prior Treatment) Patient is a 58 y.o. male presenting with abdominal pain. The history is provided by the patient.  Abdominal Pain  patient here complaining of increased upper abdominal pain x5 days. History of abdominal wall hernia and his symptoms got worse after he saw his physician when the patient says he palpated his hernia. Since that time he has had bilious emesis without fever or chills. No black or bloody stools. Pain characterized as sharp. He has been unable to keep anything down. Patient states that he vomited 5 times a day. Notes some dysuria without flank pain. Symptoms have been progressively worse. Nothing makes them better  Past Medical History  Diagnosis Date  . Hyperlipidemia   . Hypertension   . Depression   . Allergy   . Anxiety   . ALLERGIC RHINITIS 01/25/2007  . ANXIETY DISORDER, GENERALIZED 01/25/2007  . DEPRESSION 01/25/2007  . ESOPHAGITIS 12/28/2007  . GERD 01/25/2007  . GLUCOSE INTOLERANCE, HX OF 01/25/2007  . HEMORRHOIDS, INTERNAL, WITH BLEEDING 04/26/2008  . HYPERLIPIDEMIA 12/27/2007  . HYPERTENSION, ESSENTIAL NOS 01/25/2007  . Hyperthyroidism 08/11/2010  . HYPERTROPHY PROSTATE W/UR OBST & OTH LUTS 02/27/2010  . 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 LOW BACK PAIN, CHRONIC 11/08/2007  . 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 SCOLIOSIS NEC 01/25/2007  . Sebaceous cyst 08/10/2010  . SMOKER 08/05/2009  . UPPER GASTROINTESTINAL HEMORRHAGE 01/25/2007  . Rectal abscess   . Pancreatitis   . Fatty liver 10/09/2010  . Hypokalemia   . Hiatal hernia   . Heart murmur     hx of   . Arthritis    Past Surgical History  Procedure Laterality Date  . Excision of thyroid mass    . Abdominal exploration surgery   1988  . Hernia repair      ventral  . Cholecystectomy  05/20/08    Dr. Zella Richer  . Excision of scalp lesion  07/2009  . Shoulder surgery      right  . Ganglion cyst excision      right foot x 2  . Incisional hernia repair  12/01/2011    Procedure: LAPAROSCOPIC INCISIONAL HERNIA;  Surgeon: Harl Bowie, MD;  Location: [facility] OR;  Service: General;  Laterality: N/A;   Family History  Problem Relation Age of Onset  . Hypertension Father   . Hypertension Mother   . Prostate cancer Other   . Colon cancer Neg Hx    History  Substance Use Topics  . Smoking status: Current Every Day Smoker -- 1.50 packs/day for 30 years    Types: Cigarettes  . Smokeless tobacco: Not on file  . Alcohol Use: No    Review of Systems  Gastrointestinal: Positive for abdominal pain.  All other systems reviewed and are negative.      Allergies  Celecoxib; Iohexol; and Metoclopramide hcl  Home Medications   Current Outpatient Rx  Name  Route  Sig  Dispense  Refill  . amLODipine (NORVASC) 10 MG tablet   Oral   Take 10 mg by mouth daily.         . fluPHENAZine (PROLIXIN) 10 MG tablet   Oral   Take 10 mg by mouth at bedtime.         [name]  sildenafil (REVATIO) 20 MG tablet      2-3 qd prn   60 tablet   1   . spironolactone (ALDACTONE) 25 MG tablet   Oral   Take 1 tablet (25 mg total) by mouth daily.   30 tablet   5    BP 125/80  Pulse 115  Temp(Src) 98.3 F (36.8 C) (Oral)  Resp 20  SpO2 98% Physical Exam  Nursing note and vitals reviewed. Constitutional: He is oriented to person, place, and time. He appears well-developed and well-nourished.  Non-toxic appearance. No distress.  HENT:  Head: Normocephalic and atraumatic.  Eyes: Conjunctivae, EOM and lids are normal. Pupils are equal, round, and reactive to light.  Neck: Normal range of motion. Neck supple. No tracheal deviation present. No mass present.  Cardiovascular: Regular rhythm and normal heart sounds.  Tachycardia  present.  Exam reveals no gallop.   No murmur heard. Pulmonary/Chest: Effort normal and breath sounds normal. No stridor. No respiratory distress. He has no decreased breath sounds. He has no wheezes. He has no rhonchi. He has no rales.  Abdominal: Soft. Normal appearance and bowel sounds are normal. He exhibits no distension. There is tenderness in the epigastric area. There is guarding. There is no rigidity, no rebound and no CVA tenderness.    Musculoskeletal: Normal range of motion. He exhibits no edema and no tenderness.  Neurological: He is alert and oriented to person, place, and time. He has normal strength. No cranial nerve deficit or sensory deficit. GCS eye subscore is 4. GCS verbal subscore is 5. GCS motor subscore is 6.  Skin: Skin is warm and dry. No abrasion and no rash noted.  Psychiatric: He has a normal mood and affect. His speech is normal and behavior is normal.    ED Course  Procedures (including critical care time) Labs Review Labs Reviewed  CBC WITH DIFFERENTIAL - Abnormal; Notable for the following:    WBC 22.6 (*)    MCHC 36.1 (*)    Platelets 423 (*)    Neutrophils Relative % 79 (*)    Neutro Abs 17.8 (*)    Monocytes Absolute 1.6 (*)    All other components within normal limits  COMPREHENSIVE METABOLIC PANEL  LIPASE, BLOOD  URINALYSIS, ROUTINE W REFLEX MICROSCOPIC   Imaging Review Ct Abdomen Pelvis Wo Contrast  07/22/2013   CLINICAL DATA:  Mid abdominal pain, nausea and vomiting.  EXAM: CT ABDOMEN AND PELVIS WITHOUT CONTRAST  TECHNIQUE: Multidetector CT imaging of the abdomen and pelvis was performed following the standard protocol without intravenous contrast.  COMPARISON:  CT of the abdomen and pelvis performed 06/16/2012  FINDINGS: Minimal bibasilar atelectasis is noted.  The liver and spleen are unremarkable in appearance. The patient is status post cholecystectomy, with clips noted along the gallbladder fossa. The pancreas and adrenal glands are  unremarkable.  A persistent small to moderate midline anterior abdominal wall hernia is noted at the upper abdomen, broad-based and relatively stable from the prior study, with minimal associated soft tissue stranding. It contains only fat.  The kidneys are unremarkable in appearance. There is no evidence of hydronephrosis. No renal or ureteral stones are seen. No perinephric stranding is appreciated.  No free fluid is identified. The small bowel is unremarkable in appearance. The stomach is within normal limits. No acute vascular abnormalities are seen. Mild scattered calcification is seen along the abdominal aorta and its branches.  The appendix is not definitely seen; there is no evidence for appendicitis. Contrast progresses  to the level of the distal transverse colon. The colon is unremarkable in appearance.  The bladder is mildly distended and grossly unremarkable in appearance. The prostate is enlarged, measuring 5.6 cm in transverse dimension. No inguinal lymphadenopathy is seen.  No acute osseous abnormalities are identified.  IMPRESSION: 1. No definite acute abnormality seen within the abdomen or pelvis. 2. Persistent small to moderate midline anterior abdominal wall hernia noted at the upper abdomen, broad-based and relatively stable from the prior study, containing only fat and demonstrating minimal soft tissue stranding. 3. Mild scattered calcification along the abdominal aorta and its branches. 4. Enlarged prostate noted.   Electronically Signed   By: Garald Balding M.D.   On: 07/22/2013 22:55     EKG Interpretation None      MDM   Final diagnoses:  None    Patient given IV fluid bolus for his dehydration. Patient's hypokalemia treated with oral potassium. Heart rate has improved with treatment. Abdominal CT showed no signs of infection or incarcerated hernia. Repeat abdominal exam at time of discharge is stable. No evidence of a surgical abdomen    Leota Jacobsen, MD 07/22/13  2336

## 2013-07-22 NOTE — ED Notes (Signed)
Pt is still unable to urinate.  

## 2013-07-24 ENCOUNTER — Ambulatory Visit (INDEPENDENT_AMBULATORY_CARE_PROVIDER_SITE_OTHER)
Admission: RE | Admit: 2013-07-24 | Discharge: 2013-07-24 | Disposition: A | Discharge status: Home / Self Care | Payer: Medicare Other | Source: Ambulatory Visit | Attending: Internal Medicine | Admitting: Internal Medicine

## 2013-07-24 ENCOUNTER — Encounter: Payer: Self-pay | Admitting: Internal Medicine

## 2013-07-24 ENCOUNTER — Ambulatory Visit (INDEPENDENT_AMBULATORY_CARE_PROVIDER_SITE_OTHER): Payer: Medicare Other | Admitting: Internal Medicine

## 2013-07-24 VITALS — BP 112/84 | HR 122 | Temp 98.6°F | Wt 229.0 lb

## 2013-07-24 DIAGNOSIS — E559 Vitamin D deficiency, unspecified: Secondary | ICD-10-CM

## 2013-07-24 DIAGNOSIS — I10 Essential (primary) hypertension: Secondary | ICD-10-CM

## 2013-07-24 DIAGNOSIS — M545 Low back pain, unspecified: Secondary | ICD-10-CM

## 2013-07-24 DIAGNOSIS — R7309 Other abnormal glucose: Secondary | ICD-10-CM

## 2013-07-24 DIAGNOSIS — E876 Hypokalemia: Secondary | ICD-10-CM | POA: Insufficient documentation

## 2013-07-24 DIAGNOSIS — Z0000 Encounter for general adult medical examination without abnormal findings: Secondary | ICD-10-CM

## 2013-07-24 DIAGNOSIS — F2089 Other schizophrenia: Secondary | ICD-10-CM

## 2013-07-24 DIAGNOSIS — N320 Bladder-neck obstruction: Secondary | ICD-10-CM

## 2013-07-24 DIAGNOSIS — R209 Unspecified disturbances of skin sensation: Secondary | ICD-10-CM

## 2013-07-24 DIAGNOSIS — R202 Paresthesia of skin: Secondary | ICD-10-CM

## 2013-07-24 DIAGNOSIS — R739 Hyperglycemia, unspecified: Secondary | ICD-10-CM

## 2013-07-24 MED ORDER — METHYLPREDNISOLONE ACETATE 80 MG/ML IJ SUSP
80.0000 mg | Freq: Once | INTRAMUSCULAR | Status: AC
  Administered 2013-07-24: 80 mg via INTRAMUSCULAR

## 2013-07-24 MED ORDER — TRAMADOL HCL 50 MG PO TABS: 50.0000 mg | ORAL_TABLET | Freq: Two times a day (BID) | ORAL | Status: DC | PRN

## 2013-07-24 NOTE — Assessment & Plan Note (Signed)
Labs

## 2013-07-24 NOTE — Assessment & Plan Note (Addendum)
Recurrent - [facility] X ray UA Tramadol prn Depomedrol 80 im Stretch  Potential benefits of a short term opioids use as well as potential risks (i.e. addiction risk, apnea etc) and complications (i.e. Somnolence, constipation and others) were explained to the patient and were aknowledged.

## 2013-07-24 NOTE — Assessment & Plan Note (Signed)
Continue with current prescription therapy as reflected on the Med list.  

## 2013-07-24 NOTE — Progress Notes (Signed)
Pre visit review using our clinic review tool, if applicable. No additional management support is needed unless otherwise documented below in the visit note. 

## 2013-07-24 NOTE — Progress Notes (Signed)
Subjective:     Back Pain This is a chronic problem. The current episode started more than 1 year ago. The problem occurs constantly. The problem has been waxing and waning since onset. The pain is present in the lumbar spine. The pain does not radiate. The pain is at a severity of 9/10. The pain is severe. The pain is worse during the day. The symptoms are aggravated by bending. Pertinent negatives include no bladder incontinence, chest pain, leg pain or weakness. Risk factors include history of cancer. He has tried analgesics, bed rest and ice for the symptoms. The treatment provided no relief.   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 He fell off the porch 6 mo ago    Review of Systems  Constitutional: Negative for appetite change, fatigue and unexpected weight change.  HENT: Negative for congestion, nosebleeds, sneezing, sore throat, trouble swallowing and voice change.   Eyes: Negative for itching and visual disturbance.  Respiratory: Negative for cough.   Cardiovascular: Negative for chest pain, palpitations and leg swelling.  Gastrointestinal: Negative for nausea, diarrhea, blood in stool and abdominal distention.  Genitourinary: Negative for bladder incontinence, frequency and hematuria.  Musculoskeletal: Positive for back pain. Negative for gait problem, joint swelling and neck pain.  Skin: Negative for rash.  Neurological: Negative for dizziness, tremors, speech difficulty and weakness.  Psychiatric/Behavioral: Positive for behavioral problems and self-injury. Negative for suicidal ideas, sleep disturbance, dysphoric mood and agitation. The patient is nervous/anxious.        Objective:   Physical Exam  Constitutional: He is oriented to person, place, and time. He appears well-developed. No distress.  NAD  HENT:  Mouth/Throat: Oropharynx is clear and moist.  Eyes: Conjunctivae are normal. Pupils are equal, round, and  reactive to light.  Neck: Normal range of motion. No JVD present. No thyromegaly present.  Cardiovascular: Normal rate, regular rhythm, normal heart sounds and intact distal pulses.  Exam reveals no gallop and no friction rub.   No murmur heard. Pulmonary/Chest: Effort normal and breath sounds normal. No respiratory distress. He has no wheezes. He has no rales. He exhibits no tenderness.  Abdominal: Soft. Bowel sounds are normal. He exhibits no distension and no mass. There is no tenderness. There is no rebound and no guarding.  Musculoskeletal: Normal range of motion. He exhibits tenderness (LS is tender). He exhibits no edema.  Lymphadenopathy:    He has no cervical adenopathy.  Neurological: He is alert and oriented to person, place, and time. He has normal reflexes. He displays normal reflexes. No cranial nerve deficit. He exhibits normal muscle tone. He displays a negative Romberg sign. Coordination and gait normal.  No meningeal signs  Skin: Skin is warm and dry. No rash noted.  dysarthric some LS is tender Str leg elev is neg B Lab Results  Component Value Date   WBC 22.6* 07/22/2013   HGB 16.3 07/22/2013   HCT 45.1 07/22/2013   PLT 423* 07/22/2013   GLUCOSE 140* 07/22/2013   CHOL 209* 10/30/2012   TRIG 175.0* 10/30/2012   HDL 39.80 10/30/2012   LDLDIRECT 148.7 10/30/2012   LDLCALC 122* 08/08/2006   ALT 14 07/22/2013   AST 20 07/22/2013   NA 137 07/22/2013   K 3.1* 07/22/2013   CL 92* 07/22/2013   CREATININE 1.51* 07/22/2013   BUN 12 07/22/2013   CO2 25 07/22/2013   TSH 1.13 05/05/2012   PSA 2.22 02/27/2010  INR 1.0 06/10/2008   HGBA1C 5.8 10/30/2012    A complex case     Assessment & Plan:

## 2013-07-25 ENCOUNTER — Telehealth: Payer: Self-pay | Admitting: Physician Assistant

## 2013-07-25 NOTE — Telephone Encounter (Signed)
Relevant patient education mailed to patient.  

## 2013-08-24 ENCOUNTER — Encounter: Payer: Self-pay | Admitting: Internal Medicine

## 2013-08-24 ENCOUNTER — Ambulatory Visit (INDEPENDENT_AMBULATORY_CARE_PROVIDER_SITE_OTHER): Payer: Medicare Other | Admitting: Internal Medicine

## 2013-08-24 VITALS — BP 138/80 | HR 72 | Temp 99.5°F | Resp 16 | Wt 229.0 lb

## 2013-08-24 DIAGNOSIS — I10 Essential (primary) hypertension: Secondary | ICD-10-CM

## 2013-08-24 DIAGNOSIS — M545 Low back pain, unspecified: Secondary | ICD-10-CM

## 2013-08-24 MED ORDER — TRAMADOL HCL 50 MG PO TABS: 50.0000 mg | ORAL_TABLET | Freq: Two times a day (BID) | ORAL | Status: DC | PRN

## 2013-08-24 NOTE — Assessment & Plan Note (Signed)
Continue with current prescription therapy as reflected on the Med list.  

## 2013-08-24 NOTE — Progress Notes (Signed)
Subjective:     Back Pain This is a chronic problem. The current episode started more than 1 year ago. The problem occurs constantly. The problem has been gradually improving since onset. The pain is present in the lumbar spine. The pain does not radiate. The pain is at a severity of 4/10. The pain is moderate. The pain is worse during the day. The symptoms are aggravated by bending. Pertinent negatives include no bladder incontinence, chest pain, leg pain or weakness. Risk factors include history of cancer. He has tried analgesics, bed rest and ice for the symptoms. The treatment provided no relief.   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 He fell off the porch 6 mo ago Drinks Dr Malachi Bonds 2l/day. I've been on a popsickle kick too...  Wt Readings from Last 3 Encounters:  08/24/13 229 lb (103.874 kg)  07/24/13 229 lb (103.874 kg)  07/18/13 234 lb 12.8 oz (106.505 kg)   BP Readings from Last 3 Encounters:  08/24/13 138/80  07/24/13 112/84  07/23/13 150/92        Review of Systems  Constitutional: Negative for appetite change, fatigue and unexpected weight change.  HENT: Negative for congestion, nosebleeds, sneezing, sore throat, trouble swallowing and voice change.   Eyes: Negative for itching and visual disturbance.  Respiratory: Negative for cough.   Cardiovascular: Negative for chest pain, palpitations and leg swelling.  Gastrointestinal: Negative for nausea, diarrhea, blood in stool and abdominal distention.  Genitourinary: Negative for bladder incontinence, frequency and hematuria.  Musculoskeletal: Positive for back pain. Negative for gait problem, joint swelling and neck pain.  Skin: Negative for rash.  Neurological: Negative for dizziness, tremors, speech difficulty and weakness.  Psychiatric/Behavioral: Positive for behavioral problems and self-injury. Negative for suicidal ideas, sleep disturbance, dysphoric mood and  agitation. The patient is nervous/anxious.        Objective:   Physical Exam  Constitutional: He is oriented to person, place, and time. He appears well-developed. No distress.  NAD  HENT:  Mouth/Throat: Oropharynx is clear and moist.  Eyes: Conjunctivae are normal. Pupils are equal, round, and reactive to light.  Neck: Normal range of motion. No JVD present. No thyromegaly present.  Cardiovascular: Normal rate, regular rhythm, normal heart sounds and intact distal pulses.  Exam reveals no gallop and no friction rub.   No murmur heard. Pulmonary/Chest: Effort normal and breath sounds normal. No respiratory distress. He has no wheezes. He has no rales. He exhibits no tenderness.  Abdominal: Soft. Bowel sounds are normal. He exhibits no distension and no mass. There is no tenderness. There is no rebound and no guarding.  Musculoskeletal: Normal range of motion. He exhibits tenderness (LS is tender). He exhibits no edema.  Lymphadenopathy:    He has no cervical adenopathy.  Neurological: He is alert and oriented to person, place, and time. He has normal reflexes. No cranial nerve deficit. He exhibits normal muscle tone. He displays a negative Romberg sign. Coordination and gait normal.  No meningeal signs  Skin: Skin is warm and dry. No rash noted.  dysarthric some LS is less tender Str leg elev is neg B Lab Results  Component Value Date   WBC 22.6* 07/22/2013   HGB 16.3 07/22/2013   HCT 45.1 07/22/2013   PLT 423* 07/22/2013   GLUCOSE 140* 07/22/2013   CHOL 209* 10/30/2012   TRIG 175.0* 10/30/2012   HDL 39.80 10/30/2012   LDLDIRECT 148.7 10/30/2012  LDLCALC 122* 08/08/2006   ALT 14 07/22/2013   AST 20 07/22/2013   NA 137 07/22/2013   K 3.1* 07/22/2013   CL 92* 07/22/2013   CREATININE 1.51* 07/22/2013   BUN 12 07/22/2013   CO2 25 07/22/2013   TSH 1.13 05/05/2012   PSA 2.22 02/27/2010   INR 1.0 06/10/2008   HGBA1C 5.8 10/30/2012         Assessment & Plan:

## 2013-08-24 NOTE — Progress Notes (Signed)
Pre visit review using our clinic review tool, if applicable. No additional management support is needed unless otherwise documented below in the visit note. 

## 2013-08-24 NOTE — Assessment & Plan Note (Signed)
Continue with current prn prescription therapy as reflected on the Med list. Stretch

## 2013-08-25 ENCOUNTER — Telehealth: Payer: Self-pay | Admitting: Internal Medicine

## 2013-08-25 NOTE — Telephone Encounter (Signed)
Relevant patient education mailed to patient.  

## 2013-09-04 ENCOUNTER — Telehealth: Payer: Self-pay | Admitting: *Deleted

## 2013-09-04 NOTE — Telephone Encounter (Signed)
Left mess for patient to call back re: recent UDS that was positive for THC. Per MD I called [facility] and cancelled his Tramadol Rx.

## 2013-09-06 NOTE — Telephone Encounter (Signed)
Left mess for patient to call back, closing phone note until pt calls bac

## 2013-09-18 ENCOUNTER — Encounter: Payer: Self-pay | Admitting: Internal Medicine

## 2013-10-04 ENCOUNTER — Emergency Department (HOSPITAL_COMMUNITY): Payer: Medicare Other

## 2013-10-04 ENCOUNTER — Emergency Department (HOSPITAL_COMMUNITY)
Admission: EM | Admit: 2013-10-04 | Discharge: 2013-10-04 | Disposition: A | Discharge status: Home / Self Care | Payer: Medicare Other | Attending: Emergency Medicine | Admitting: Emergency Medicine

## 2013-10-04 ENCOUNTER — Encounter (HOSPITAL_COMMUNITY): Payer: Self-pay | Admitting: Emergency Medicine

## 2013-10-04 DIAGNOSIS — R109 Unspecified abdominal pain: Secondary | ICD-10-CM

## 2013-10-04 DIAGNOSIS — Z862 Personal history of diseases of the blood and blood-forming organs and certain disorders involving the immune mechanism: Secondary | ICD-10-CM | POA: Insufficient documentation

## 2013-10-04 DIAGNOSIS — Z8639 Personal history of other endocrine, nutritional and metabolic disease: Secondary | ICD-10-CM | POA: Insufficient documentation

## 2013-10-04 DIAGNOSIS — Z8709 Personal history of other diseases of the respiratory system: Secondary | ICD-10-CM | POA: Insufficient documentation

## 2013-10-04 DIAGNOSIS — I10 Essential (primary) hypertension: Secondary | ICD-10-CM | POA: Insufficient documentation

## 2013-10-04 DIAGNOSIS — E876 Hypokalemia: Secondary | ICD-10-CM | POA: Insufficient documentation

## 2013-10-04 DIAGNOSIS — M549 Dorsalgia, unspecified: Secondary | ICD-10-CM | POA: Insufficient documentation

## 2013-10-04 DIAGNOSIS — N138 Other obstructive and reflux uropathy: Secondary | ICD-10-CM | POA: Insufficient documentation

## 2013-10-04 DIAGNOSIS — N401 Enlarged prostate with lower urinary tract symptoms: Secondary | ICD-10-CM | POA: Insufficient documentation

## 2013-10-04 DIAGNOSIS — F17200 Nicotine dependence, unspecified, uncomplicated: Secondary | ICD-10-CM | POA: Insufficient documentation

## 2013-10-04 DIAGNOSIS — K922 Gastrointestinal hemorrhage, unspecified: Secondary | ICD-10-CM | POA: Insufficient documentation

## 2013-10-04 DIAGNOSIS — F411 Generalized anxiety disorder: Secondary | ICD-10-CM | POA: Insufficient documentation

## 2013-10-04 DIAGNOSIS — K219 Gastro-esophageal reflux disease without esophagitis: Secondary | ICD-10-CM | POA: Insufficient documentation

## 2013-10-04 DIAGNOSIS — R011 Cardiac murmur, unspecified: Secondary | ICD-10-CM | POA: Insufficient documentation

## 2013-10-04 DIAGNOSIS — F329 Major depressive disorder, single episode, unspecified: Secondary | ICD-10-CM | POA: Insufficient documentation

## 2013-10-04 DIAGNOSIS — Z79899 Other long term (current) drug therapy: Secondary | ICD-10-CM | POA: Insufficient documentation

## 2013-10-04 DIAGNOSIS — F3289 Other specified depressive episodes: Secondary | ICD-10-CM | POA: Insufficient documentation

## 2013-10-04 DIAGNOSIS — N529 Male erectile dysfunction, unspecified: Secondary | ICD-10-CM | POA: Insufficient documentation

## 2013-10-04 DIAGNOSIS — F2089 Other schizophrenia: Secondary | ICD-10-CM | POA: Insufficient documentation

## 2013-10-04 LAB — RAPID URINE DRUG SCREEN, HOSP PERFORMED
Amphetamines: NOT DETECTED
Barbiturates: NOT DETECTED
Benzodiazepines: NOT DETECTED
Cocaine: NOT DETECTED
Opiates: NOT DETECTED
Tetrahydrocannabinol: POSITIVE — AB

## 2013-10-04 LAB — CBC WITH DIFFERENTIAL/PLATELET
Basophils Absolute: 0 K/uL (ref 0.0–0.1)
Basophils Relative: 0 % (ref 0–1)
Eosinophils Absolute: 0 K/uL (ref 0.0–0.7)
Eosinophils Relative: 0 % (ref 0–5)
HCT: 45.1 % (ref 39.0–52.0)
Hemoglobin: 16 g/dL (ref 13.0–17.0)
Lymphocytes Relative: 20 % (ref 12–46)
Lymphs Abs: 3.4 K/uL (ref 0.7–4.0)
MCH: 29.9 pg (ref 26.0–34.0)
MCHC: 35.5 g/dL (ref 30.0–36.0)
MCV: 84.3 fL (ref 78.0–100.0)
Monocytes Absolute: 1.8 K/uL — ABNORMAL HIGH (ref 0.1–1.0)
Monocytes Relative: 10 % (ref 3–12)
Neutro Abs: 12.3 K/uL — ABNORMAL HIGH (ref 1.7–7.7)
Neutrophils Relative %: 70 % (ref 43–77)
Platelets: 345 K/uL (ref 150–400)
RBC: 5.35 MIL/uL (ref 4.22–5.81)
RDW: 14.2 % (ref 11.5–15.5)
WBC: 17.5 K/uL — ABNORMAL HIGH (ref 4.0–10.5)

## 2013-10-04 LAB — COMPREHENSIVE METABOLIC PANEL WITH GFR
ALT: 21 U/L (ref 0–53)
AST: 25 U/L (ref 0–37)
Albumin: 4.1 g/dL (ref 3.5–5.2)
Alkaline Phosphatase: 83 U/L (ref 39–117)
BUN: 13 mg/dL (ref 6–23)
CO2: 23 meq/L (ref 19–32)
Calcium: 9.9 mg/dL (ref 8.4–10.5)
Chloride: 94 meq/L — ABNORMAL LOW (ref 96–112)
Creatinine, Ser: 1.48 mg/dL — ABNORMAL HIGH (ref 0.50–1.35)
GFR calc Af Amer: 59 mL/min — ABNORMAL LOW
GFR calc non Af Amer: 51 mL/min — ABNORMAL LOW
Glucose, Bld: 124 mg/dL — ABNORMAL HIGH (ref 70–99)
Potassium: 2.7 meq/L — CL (ref 3.7–5.3)
Sodium: 138 meq/L (ref 137–147)
Total Bilirubin: 1.4 mg/dL — ABNORMAL HIGH (ref 0.3–1.2)
Total Protein: 8 g/dL (ref 6.0–8.3)

## 2013-10-04 LAB — URINALYSIS, ROUTINE W REFLEX MICROSCOPIC
Glucose, UA: NEGATIVE mg/dL
Hgb urine dipstick: NEGATIVE
Ketones, ur: 15 mg/dL — AB
Leukocytes, UA: NEGATIVE
Nitrite: NEGATIVE
Protein, ur: 30 mg/dL — AB
Specific Gravity, Urine: 1.015 (ref 1.005–1.030)
Urobilinogen, UA: 1 mg/dL (ref 0.0–1.0)
pH: 6.5 (ref 5.0–8.0)

## 2013-10-04 LAB — URINE MICROSCOPIC-ADD ON

## 2013-10-04 LAB — LIPASE, BLOOD: Lipase: 21 U/L (ref 11–59)

## 2013-10-04 MED ORDER — HYDROMORPHONE HCL PF 1 MG/ML IJ SOLN
1.0000 mg | Freq: Once | INTRAMUSCULAR | Status: AC
  Administered 2013-10-04: 1 mg via INTRAVENOUS
  Filled 2013-10-04: qty 1

## 2013-10-04 MED ORDER — POTASSIUM CHLORIDE CRYS ER 20 MEQ PO TBCR
40.0000 meq | EXTENDED_RELEASE_TABLET | Freq: Once | ORAL | Status: AC
  Administered 2013-10-04: 40 meq via ORAL
  Filled 2013-10-04: qty 2

## 2013-10-04 MED ORDER — SODIUM CHLORIDE 0.9 % IV BOLUS (SEPSIS)
1000.0000 mL | Freq: Once | INTRAVENOUS | Status: AC
Start: 2013-10-04 — End: 2013-10-04
  Administered 2013-10-04: 1000 mL via INTRAVENOUS

## 2013-10-04 MED ORDER — POTASSIUM CHLORIDE 10 MEQ/100ML IV SOLN
10.0000 meq | INTRAVENOUS | Status: DC
  Administered 2013-10-04: 10 meq via INTRAVENOUS
  Filled 2013-10-04: qty 100

## 2013-10-04 MED ORDER — DIAZEPAM 5 MG/ML IJ SOLN
5.0000 mg | Freq: Once | INTRAMUSCULAR | Status: AC
  Administered 2013-10-04: 5 mg via INTRAVENOUS
  Filled 2013-10-04: qty 2

## 2013-10-04 MED ORDER — PROMETHAZINE HCL 25 MG PO TABS: 25.0000 mg | ORAL_TABLET | Freq: Four times a day (QID) | ORAL | Status: DC | PRN

## 2013-10-04 MED ORDER — POTASSIUM CHLORIDE CRYS ER 20 MEQ PO TBCR: 20.0000 meq | EXTENDED_RELEASE_TABLET | Freq: Every day | ORAL | Status: DC

## 2013-10-04 NOTE — ED Notes (Signed)
Pt. report mid/low back pain for 3 days radiating to both legs with nausea and vomitting , denies injury or fall , ambulatory / no urinary discomfort . Denies fever or chills.

## 2013-10-04 NOTE — ED Provider Notes (Addendum)
CSN: [id number]     Arrival date & time 10/04/13  0120 History   First MD Initiated Contact with Patient 10/04/13 [phone]     Chief Complaint  Patient presents with  . Back Pain     (Consider location/radiation/quality/duration/timing/severity/associated sxs/prior Treatment) The history is provided by the patient.  [name] is a 58 y.o. male hx of HL, HTN depression, chronic back pain here with worsening back and abdominal pain. He has chronic back pain for years. For the last 3 days, pain now radiate down his legs. Denies urinary symptoms or retention. Able to walk but has pain. Denies weakness or numbness. No new injuries. Also has diffuse abdominal pain and had multiple abdominal surgeries previously. Has some nausea and vomiting. He has been taking marijuana for pain. Was prescribed potassium but hasn't been taking it. Upon further review of his chart, he was prescribed tramadol but his doctor but about 2 weeks ago, tramadol was d/c because of marijuana use. Denies having a pain contract.    Past Medical History  Diagnosis Date  . Hyperlipidemia   . Hypertension   . Depression   . Allergy   . Anxiety   . ALLERGIC RHINITIS 01/25/2007  . ANXIETY DISORDER, GENERALIZED 01/25/2007  . DEPRESSION 01/25/2007  . ESOPHAGITIS 12/28/2007  . GERD 01/25/2007  . GLUCOSE INTOLERANCE, HX OF 01/25/2007  . HEMORRHOIDS, INTERNAL, WITH BLEEDING 04/26/2008  . HYPERLIPIDEMIA 12/27/2007  . HYPERTENSION, ESSENTIAL NOS 01/25/2007  . Hyperthyroidism 08/11/2010  . HYPERTROPHY PROSTATE W/UR OBST & OTH LUTS 02/27/2010  . 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 LOW BACK PAIN, CHRONIC 11/08/2007  . 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 SCOLIOSIS NEC 01/25/2007  . Sebaceous cyst 08/10/2010  . SMOKER 08/05/2009  . UPPER GASTROINTESTINAL HEMORRHAGE 01/25/2007  . Rectal abscess   . Pancreatitis   . Fatty liver 10/09/2010  . Hypokalemia   . Hiatal hernia   . Heart murmur     hx of   . Arthritis    Past Surgical  History  Procedure Laterality Date  . Excision of thyroid mass    . Abdominal exploration surgery  1988  . Hernia repair      ventral  . Cholecystectomy  05/20/08    Dr. Zella Richer  . Excision of scalp lesion  07/2009  . Shoulder surgery      right  . Ganglion cyst excision      right foot x 2  . Incisional hernia repair  12/01/2011    Procedure: LAPAROSCOPIC INCISIONAL HERNIA;  Surgeon: Harl Bowie, MD;  Location: [facility] OR;  Service: General;  Laterality: N/A;   Family History  Problem Relation Age of Onset  . Hypertension Father   . Hypertension Mother   . Prostate cancer Other   . Colon cancer Neg Hx    History  Substance Use Topics  . Smoking status: Current Every Day Smoker -- 1.50 packs/day for 30 years    Types: Cigarettes  . Smokeless tobacco: Not on file  . Alcohol Use: No    Review of Systems  Gastrointestinal: Positive for abdominal pain.  Musculoskeletal: Positive for back pain.  All other systems reviewed and are negative.     Allergies  Celecoxib; Iohexol; and Metoclopramide hcl  Home Medications   Prior to Admission medications   Medication Sig Start Date End Date Taking? Authorizing Provider  alum & mag hydroxide-simeth (MAALOX/MYLANTA) 200-200-20 MG/5ML suspension Take 30 mLs by mouth every 6 (six) hours as needed for indigestion or heartburn (  for upset stomach).    Historical Provider, MD  amLODipine (NORVASC) 10 MG tablet Take 10 mg by mouth daily. 10/27/11   Neena Rhymes, MD  calcium carbonate (TUMS - DOSED IN MG ELEMENTAL CALCIUM) 500 MG chewable tablet Chew 2 tablets by mouth daily as needed for indigestion or heartburn (for upset stomach).    Historical Provider, MD  Docusate Calcium (STOOL SOFTENER PO) Take by mouth daily.    Historical Provider, MD  esomeprazole (NEXIUM) 20 MG capsule Take 20 mg by mouth daily at 12 noon.    Historical Provider, MD  fluPHENAZine (PROLIXIN) 10 MG tablet Take 10 mg by mouth at bedtime.    Historical  Provider, MD  spironolactone (ALDACTONE) 25 MG tablet Take 1 tablet (25 mg total) by mouth daily. 07/18/13   Hendricks Limes, MD  traMADol (ULTRAM) 50 MG tablet Take 1 tablet (50 mg total) by mouth 2 (two) times daily as needed for severe pain. 08/24/13   Aleksei Plotnikov V, MD   BP 127/79  Pulse 86  Temp(Src) 97.7 F (36.5 C) (Oral)  Resp 17  SpO2 100% Physical Exam  Nursing note and vitals reviewed. Constitutional: He is oriented to person, place, and time.  Uncomfortable, writhing in pain   HENT:  Head: Normocephalic.  Mouth/Throat: Oropharynx is clear and moist.  Eyes: Conjunctivae and EOM are normal. Pupils are equal, round, and reactive to light.  Neck: Normal range of motion. Neck supple.  Cardiovascular: Normal rate, regular rhythm and normal heart sounds.   Pulmonary/Chest: Effort normal and breath sounds normal. No respiratory distress. He has no wheezes. He has no rales.  Abdominal: Soft. Bowel sounds are normal.  Mild diffuse tenderness, no rebound. No obvious CVAT   Musculoskeletal: Normal range of motion.  paralumbar tenderness   Neurological: He is alert and oriented to person, place, and time.  Neg straight leg raise. No saddle anesthesia. Nl strength bilateral lower extremities. Nl reflexes.   Skin: Skin is warm.  Psychiatric: He has a normal mood and affect. His behavior is normal. Judgment and thought content normal.    ED Course  Procedures (including critical care time) Labs Review Labs Reviewed  CBC WITH DIFFERENTIAL - Abnormal; Notable for the following:    WBC 17.5 (*)    Neutro Abs 12.3 (*)    Monocytes Absolute 1.8 (*)    All other components within normal limits  COMPREHENSIVE METABOLIC PANEL - Abnormal; Notable for the following:    Potassium 2.7 (*)    Chloride 94 (*)    Glucose, Bld 124 (*)    Creatinine, Ser 1.48 (*)    Total Bilirubin 1.4 (*)    GFR calc non Af Amer 51 (*)    GFR calc Af Amer 59 (*)    All other components within normal  limits  URINALYSIS, ROUTINE W REFLEX MICROSCOPIC - Abnormal; Notable for the following:    Color, Urine AMBER (*)    Bilirubin Urine SMALL (*)    Ketones, ur 15 (*)    Protein, ur 30 (*)    All other components within normal limits  URINE RAPID DRUG SCREEN (HOSP PERFORMED) - Abnormal; Notable for the following:    Tetrahydrocannabinol POSITIVE (*)    All other components within normal limits  URINE MICROSCOPIC-ADD ON - Abnormal; Notable for the following:    Casts HYALINE CASTS (*)    All other components within normal limits  URINE CULTURE  LIPASE, BLOOD    Imaging Review Dg Lumbar  Spine Complete  10/04/2013   CLINICAL DATA:  Lower back pain for several years, with pain radiating down both legs.  EXAM: LUMBAR SPINE - COMPLETE 4+ VIEW  COMPARISON:  Lumbar spine radiographs performed 07/24/2013  FINDINGS: There is no evidence of fracture or subluxation. Vertebral bodies demonstrate normal height and alignment. Minimal sclerotic change is noted at the anterior inferior endplate of L1. Intervertebral disc spaces are preserved. The visualized neural foramina are grossly unremarkable in appearance.  The visualized bowel gas pattern is unremarkable in appearance; air and stool are noted within the colon. The sacroiliac joints are within normal limits. Clips are noted within the right upper quadrant, reflecting prior cholecystectomy.  IMPRESSION: No evidence of fracture or subluxation along the lumbar spine.   Electronically Signed   By: Garald Balding M.D.   On: 10/04/2013 04:38   Dg Abd Acute W/chest  10/04/2013   CLINICAL DATA:  Abdominal pain and back pain.  History of smoking.  EXAM: ACUTE ABDOMEN SERIES (ABDOMEN 2 VIEW & CHEST 1 VIEW)  COMPARISON:  Chest radiograph performed 03/12/2013, and CT of the abdomen and pelvis performed 07/22/2013  FINDINGS: The lungs are well-aerated and clear. There is no evidence of focal opacification, pleural effusion or pneumothorax. The cardiomediastinal  silhouette is within normal limits.  The visualized bowel gas pattern is unremarkable. Scattered stool and air are seen within the colon; there is no evidence of small bowel dilatation to suggest obstruction. No free intra-abdominal air is identified on the provided upright view. Clips are noted within the right upper quadrant, reflecting prior cholecystectomy.  No acute osseous abnormalities are seen; the sacroiliac joints are unremarkable in appearance.  IMPRESSION: 1. Unremarkable bowel gas pattern; no free intra-abdominal air seen. 2. No acute cardiopulmonary process seen.   Electronically Signed   By: Garald Balding M.D.   On: 10/04/2013 04:37     EKG Interpretation None      MDM   Final diagnoses:  None    Ayad Oregel is a 58 y.o. male here with worsening of chronic back and ab pain. I doubt spinal compression and has no symptoms of cauda equina. Has multiple ab surgery so consider SBO. But I think likely worsening of chronic pain and exacerbated by marijuana use. Will check labs, xrays. Will reassess.   6:35 AM Labs showed K 2.7 (likely from vomiting, uncompliance with potassium pills). WBC 17 but was 22 recently and UA and CXR clear. I think pain likely worsening of chronic pain, exacerbated by marijuana use and hypokalemia. K supplemented, pain free. Will d/c home with phenergan prn, several doses of K pills. He will need to get pain meds from PMD and get repeat BMP in a week.    Wandra Arthurs, MD 10/04/13 3013  Wandra Arthurs, MD 10/04/13 [phone]

## 2013-10-04 NOTE — Discharge Instructions (Signed)
Take potassium pills for the next 5 days.   Take phenergan for nausea.   Stay hydrated.   You need to have a recheck potassium level in a week with your doctor.   You need to talk to your doctor about your pain medicines.   Return to ER if you have severe pain, vomiting, dehydration, cramps.

## 2013-10-04 NOTE — ED Notes (Signed)
Discharge instructions reviewed with pt. Pt verbalized understanding.   

## 2013-10-04 NOTE — ED Notes (Signed)
Pt c/o of chronic back pain that has gotten worse in the last 3 days, constipation since Sunday, abd pain described as cramping and burning. Pt states he has had multiple abd surgeries and 3 Hernia repairs. Pt also had gallbladder removed. Pt tried fluids and some pain medication he had at home with no relief. Rates pain 10/10. Pt unable to sit still, agitated.

## 2013-10-05 LAB — URINE CULTURE
CULTURE: NO GROWTH
Colony Count: NO GROWTH

## 2013-11-02 ENCOUNTER — Telehealth: Payer: Self-pay | Admitting: Internal Medicine

## 2013-11-02 NOTE — Telephone Encounter (Signed)
Patient  Called this morning stating that he was in the hospital 2 weeks ago and they told him he would need to start taking potassium.   I told him that we would need to schedule an appointment with Dr. Alain Marion.   He then stated that he was in a lot of pain and wanted his pain meds refilled.   He stated that he did not want to come in for and appointment b/c all we do is take his money and he is still in a lot of pain and does not feel better.   I told him I could get him in next week with Dr. Alain Marion and he then stated he had to see him today.   I told him that we could not get him in today and he stated he would call back to make an appointment.

## 2013-11-02 NOTE — Telephone Encounter (Signed)
1.Take potassium - it should be at the drug store. 2. I'm not able to give him any pain meds due to the fact that he is using pot (according to his drug screen report) Thx

## 2013-11-05 NOTE — Telephone Encounter (Signed)
LMOVM advising per MD and will need appt if any other concerns

## 2013-11-15 ENCOUNTER — Other Ambulatory Visit: Payer: Self-pay | Admitting: *Deleted

## 2013-11-15 MED ORDER — AMLODIPINE BESYLATE 10 MG PO TABS: 10.0000 mg | ORAL_TABLET | Freq: Every day | ORAL | Status: DC

## 2013-11-22 ENCOUNTER — Other Ambulatory Visit: Payer: Self-pay

## 2013-11-22 MED ORDER — AMLODIPINE BESYLATE 10 MG PO TABS: 10.0000 mg | ORAL_TABLET | Freq: Every day | ORAL | Status: DC

## 2013-11-28 ENCOUNTER — Ambulatory Visit: Payer: Medicare Other | Admitting: Internal Medicine

## 2013-11-30 ENCOUNTER — Telehealth: Payer: Self-pay | Admitting: Internal Medicine

## 2013-11-30 DIAGNOSIS — L989 Disorder of the skin and subcutaneous tissue, unspecified: Secondary | ICD-10-CM

## 2013-11-30 NOTE — Telephone Encounter (Signed)
Patient is calling to request a referral to see a dermatologist. He states that he has a wart on his ear lobe that he wants to have looked at. Please advise.

## 2013-11-30 NOTE — Telephone Encounter (Signed)
Ok Thx 

## 2013-12-01 ENCOUNTER — Emergency Department (INDEPENDENT_AMBULATORY_CARE_PROVIDER_SITE_OTHER)
Admission: EM | Admit: 2013-12-01 | Discharge: 2013-12-01 | Disposition: A | Discharge status: Home / Self Care | Payer: Medicare Other | Source: Home / Self Care | Attending: Family Medicine | Admitting: Family Medicine

## 2013-12-01 ENCOUNTER — Encounter (HOSPITAL_COMMUNITY): Payer: Self-pay | Admitting: Emergency Medicine

## 2013-12-01 DIAGNOSIS — H61109 Unspecified noninfective disorders of pinna, unspecified ear: Secondary | ICD-10-CM

## 2013-12-01 DIAGNOSIS — H61102 Unspecified noninfective disorders of pinna, left ear: Secondary | ICD-10-CM

## 2013-12-01 MED ORDER — MUPIROCIN CALCIUM 2 % EX CREA: 1.0000 "application " | TOPICAL_CREAM | Freq: Three times a day (TID) | CUTANEOUS | Status: DC

## 2013-12-01 NOTE — ED Provider Notes (Signed)
CSN: [id number]     Arrival date & time 12/01/13  1638 History   First MD Initiated Contact with Patient 12/01/13 1653     Chief Complaint  Patient presents with  . Keloid   (Consider location/radiation/quality/duration/timing/severity/associated sxs/prior Treatment) Patient is a 58 y.o. male presenting with abscess. The history is provided by the patient.  Abscess Location:  Head/neck Head/neck abscess location:  L ear Abscess quality: draining and painful   Red streaking: no   Duration:  1 day Progression:  Improving Chronicity:  Chronic Context comment:  Fleshy lesion has been preent for yrs, spont opened last eve. Relieved by:  Draining/squeezing and warm compresses   Past Medical History  Diagnosis Date  . Hyperlipidemia   . Hypertension   . Depression   . Allergy   . Anxiety   . ALLERGIC RHINITIS 01/25/2007  . ANXIETY DISORDER, GENERALIZED 01/25/2007  . DEPRESSION 01/25/2007  . ESOPHAGITIS 12/28/2007  . GERD 01/25/2007  . GLUCOSE INTOLERANCE, HX OF 01/25/2007  . HEMORRHOIDS, INTERNAL, WITH BLEEDING 04/26/2008  . HYPERLIPIDEMIA 12/27/2007  . HYPERTENSION, ESSENTIAL NOS 01/25/2007  . Hyperthyroidism 08/11/2010  . HYPERTROPHY PROSTATE W/UR OBST & OTH LUTS 02/27/2010  . 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 LOW BACK PAIN, CHRONIC 11/08/2007  . 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 SCOLIOSIS NEC 01/25/2007  . Sebaceous cyst 08/10/2010  . SMOKER 08/05/2009  . UPPER GASTROINTESTINAL HEMORRHAGE 01/25/2007  . Rectal abscess   . Pancreatitis   . Fatty liver 10/09/2010  . Hypokalemia   . Hiatal hernia   . Heart murmur     hx of   . Arthritis    Past Surgical History  Procedure Laterality Date  . Excision of thyroid mass    . Abdominal exploration surgery  1988  . Hernia repair      ventral  . Cholecystectomy  05/20/08    Dr. Zella Richer  . Excision of scalp lesion  07/2009  . Shoulder surgery      right  . Ganglion cyst excision      right foot x 2  . Incisional hernia  repair  12/01/2011    Procedure: LAPAROSCOPIC INCISIONAL HERNIA;  Surgeon: Harl Bowie, MD;  Location: [facility] OR;  Service: General;  Laterality: N/A;   Family History  Problem Relation Age of Onset  . Hypertension Father   . Hypertension Mother   . Prostate cancer Other   . Colon cancer Neg Hx    History  Substance Use Topics  . Smoking status: Current Every Day Smoker -- 1.50 packs/day for 30 years    Types: Cigarettes  . Smokeless tobacco: Not on file  . Alcohol Use: No    Review of Systems  Constitutional: Negative.   Skin: Positive for wound.    Allergies  Celecoxib; Iohexol; and Metoclopramide hcl  Home Medications   Prior to Admission medications   Medication Sig Start Date End Date Taking? Authorizing Provider  alum & mag hydroxide-simeth (MAALOX/MYLANTA) 200-200-20 MG/5ML suspension Take 30 mLs by mouth every 6 (six) hours as needed for indigestion or heartburn (for upset stomach).    Historical Provider, MD  amLODipine (NORVASC) 10 MG tablet Take 1 tablet (10 mg total) by mouth daily. 11/22/13   Aleksei Plotnikov V, MD  calcium carbonate (TUMS - DOSED IN MG ELEMENTAL CALCIUM) 500 MG chewable tablet Chew 2 tablets by mouth daily as needed for indigestion or heartburn (for upset stomach).    Historical Provider, MD  Docusate Calcium (STOOL SOFTENER PO) Take  by mouth daily.    Historical Provider, MD  esomeprazole (NEXIUM) 20 MG capsule Take 20 mg by mouth daily at 12 noon.    Historical Provider, MD  fluPHENAZine (PROLIXIN) 10 MG tablet Take 10 mg by mouth at bedtime.    Historical Provider, MD  mupirocin cream (BACTROBAN) 2 % Apply 1 application topically 3 (three) times daily. 12/01/13   Billy Fischer, MD  potassium chloride SA (K-DUR,KLOR-CON) 20 MEQ tablet Take 1 tablet (20 mEq total) by mouth daily. 10/04/13   Wandra Arthurs, MD  promethazine (PHENERGAN) 25 MG tablet Take 1 tablet (25 mg total) by mouth every 6 (six) hours as needed for nausea or vomiting. 10/04/13    Wandra Arthurs, MD  spironolactone (ALDACTONE) 25 MG tablet Take 1 tablet (25 mg total) by mouth daily. 07/18/13   Hendricks Limes, MD  traMADol (ULTRAM) 50 MG tablet Take 1 tablet (50 mg total) by mouth 2 (two) times daily as needed for severe pain. 08/24/13   Aleksei Plotnikov V, MD   BP 130/95  Pulse 86  Temp(Src) 99.2 F (37.3 C) (Oral)  Resp 18  SpO2 95% Physical Exam  Nursing note and vitals reviewed. Constitutional: He is oriented to person, place, and time. He appears well-developed and well-nourished.  Neck: Normal range of motion. Neck supple.  Neurological: He is alert and oriented to person, place, and time.  Skin: Skin is warm and dry.  spont draining cystic lesion on post lower pole of pinna, sl sts, no drainage expressed.    ED Course  Procedures (including critical care time) Labs Review Labs Reviewed - No data to display  Imaging Review No results found.   MDM   1. Pinna disorder, left        Billy Fischer, MD 12/01/13 [phone]

## 2013-12-01 NOTE — ED Notes (Signed)
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

## 2013-12-01 NOTE — Discharge Instructions (Signed)
Use warm water then cream to left ear 3 times a day and see dermatologist in 7-10 days.

## 2013-12-03 NOTE — Telephone Encounter (Signed)
Notified pt referral has been place will received a call from [facility] once appt has been set-up with appt, date & time.Marland KitchenJohny [name]

## 2013-12-30 ENCOUNTER — Inpatient Hospital Stay (HOSPITAL_COMMUNITY)
Admission: EM | Admit: 2013-12-30 | Discharge: 2014-01-01 | DRG: 392 | Disposition: A | Discharge status: Home / Self Care | Payer: Medicare Other | Attending: Family Medicine | Admitting: Family Medicine

## 2013-12-30 ENCOUNTER — Emergency Department (HOSPITAL_COMMUNITY): Payer: Medicare Other

## 2013-12-30 ENCOUNTER — Encounter (HOSPITAL_COMMUNITY): Payer: Self-pay | Admitting: Emergency Medicine

## 2013-12-30 DIAGNOSIS — K219 Gastro-esophageal reflux disease without esophagitis: Secondary | ICD-10-CM

## 2013-12-30 DIAGNOSIS — F209 Schizophrenia, unspecified: Secondary | ICD-10-CM | POA: Diagnosis present

## 2013-12-30 DIAGNOSIS — F411 Generalized anxiety disorder: Secondary | ICD-10-CM | POA: Diagnosis present

## 2013-12-30 DIAGNOSIS — F2089 Other schizophrenia: Secondary | ICD-10-CM

## 2013-12-30 DIAGNOSIS — K2100 Gastro-esophageal reflux disease with esophagitis, without bleeding: Principal | ICD-10-CM | POA: Diagnosis present

## 2013-12-30 DIAGNOSIS — T182XXA Foreign body in stomach, initial encounter: Secondary | ICD-10-CM | POA: Diagnosis present

## 2013-12-30 DIAGNOSIS — F329 Major depressive disorder, single episode, unspecified: Secondary | ICD-10-CM | POA: Diagnosis present

## 2013-12-30 DIAGNOSIS — R634 Abnormal weight loss: Secondary | ICD-10-CM | POA: Diagnosis present

## 2013-12-30 DIAGNOSIS — D72829 Elevated white blood cell count, unspecified: Secondary | ICD-10-CM | POA: Diagnosis present

## 2013-12-30 DIAGNOSIS — R112 Nausea with vomiting, unspecified: Secondary | ICD-10-CM | POA: Diagnosis not present

## 2013-12-30 DIAGNOSIS — R1013 Epigastric pain: Secondary | ICD-10-CM

## 2013-12-30 DIAGNOSIS — T189XXA Foreign body of alimentary tract, part unspecified, initial encounter: Secondary | ICD-10-CM

## 2013-12-30 DIAGNOSIS — F17200 Nicotine dependence, unspecified, uncomplicated: Secondary | ICD-10-CM | POA: Diagnosis present

## 2013-12-30 DIAGNOSIS — Z6833 Body mass index (BMI) 33.0-33.9, adult: Secondary | ICD-10-CM

## 2013-12-30 DIAGNOSIS — I10 Essential (primary) hypertension: Secondary | ICD-10-CM | POA: Diagnosis present

## 2013-12-30 DIAGNOSIS — E876 Hypokalemia: Secondary | ICD-10-CM | POA: Diagnosis present

## 2013-12-30 DIAGNOSIS — F3289 Other specified depressive episodes: Secondary | ICD-10-CM | POA: Diagnosis present

## 2013-12-30 DIAGNOSIS — Z79899 Other long term (current) drug therapy: Secondary | ICD-10-CM

## 2013-12-30 DIAGNOSIS — E785 Hyperlipidemia, unspecified: Secondary | ICD-10-CM | POA: Diagnosis present

## 2013-12-30 LAB — COMPREHENSIVE METABOLIC PANEL
ALT: <5 U/L (ref 0–53)
AST: 12 U/L (ref 0–37)
Albumin: 4.4 g/dL (ref 3.5–5.2)
Alkaline Phosphatase: 81 U/L (ref 39–117)
Anion gap: 21 — ABNORMAL HIGH (ref 5–15)
BUN: 7 mg/dL (ref 6–23)
CALCIUM: 10.3 mg/dL (ref 8.4–10.5)
CO2: 23 mEq/L (ref 19–32)
CREATININE: 1.26 mg/dL (ref 0.50–1.35)
Chloride: 97 mEq/L (ref 96–112)
GFR calc Af Amer: 72 mL/min — ABNORMAL LOW (ref >90)
GFR, EST NON AFRICAN AMERICAN: 62 mL/min — AB (ref >90)
GLUCOSE: 164 mg/dL — AB (ref 70–99)
Potassium: 3 mEq/L — ABNORMAL LOW (ref 3.7–5.3)
Sodium: 141 mEq/L (ref 137–147)
Total Bilirubin: 0.4 mg/dL (ref 0.3–1.2)
Total Protein: 8.5 g/dL — ABNORMAL HIGH (ref 6.0–8.3)

## 2013-12-30 LAB — CBC WITH DIFFERENTIAL/PLATELET
Basophils Absolute: 0 10*3/uL (ref 0.0–0.1)
Basophils Relative: 0 % (ref 0–1)
EOS PCT: 0 % (ref 0–5)
Eosinophils Absolute: 0 10*3/uL (ref 0.0–0.7)
HEMATOCRIT: 45.8 % (ref 39.0–52.0)
HEMOGLOBIN: 16.4 g/dL (ref 13.0–17.0)
LYMPHS ABS: 1.3 10*3/uL (ref 0.7–4.0)
Lymphocytes Relative: 10 % — ABNORMAL LOW (ref 12–46)
MCH: 30.1 pg (ref 26.0–34.0)
MCHC: 35.8 g/dL (ref 30.0–36.0)
MCV: 84 fL (ref 78.0–100.0)
MONO ABS: 0.7 10*3/uL (ref 0.1–1.0)
MONOS PCT: 5 % (ref 3–12)
NEUTROS ABS: 11.7 10*3/uL — AB (ref 1.7–7.7)
Neutrophils Relative %: 85 % — ABNORMAL HIGH (ref 43–77)
Platelets: 329 10*3/uL (ref 150–400)
RBC: 5.45 MIL/uL (ref 4.22–5.81)
RDW: 13.8 % (ref 11.5–15.5)
WBC: 13.7 10*3/uL — AB (ref 4.0–10.5)

## 2013-12-30 LAB — LIPASE, BLOOD: Lipase: 20 U/L (ref 11–59)

## 2013-12-30 MED ORDER — SODIUM CHLORIDE 0.9 % IV SOLN
1000.0000 mL | Freq: Once | INTRAVENOUS | Status: AC
  Administered 2013-12-30: 1000 mL via INTRAVENOUS

## 2013-12-30 MED ORDER — PROMETHAZINE HCL 25 MG PO TABS: 25.0000 mg | ORAL_TABLET | Freq: Four times a day (QID) | ORAL | Status: DC | PRN

## 2013-12-30 MED ORDER — ONDANSETRON HCL 4 MG/2ML IJ SOLN: 4.0000 mg | Freq: Once | INTRAMUSCULAR | Status: DC

## 2013-12-30 MED ORDER — HYDROMORPHONE HCL PF 1 MG/ML IJ SOLN
1.0000 mg | INTRAMUSCULAR | Status: DC | PRN
  Administered 2013-12-30 – 2013-12-31 (×2): 1 mg via INTRAVENOUS
  Filled 2013-12-30 (×2): qty 1

## 2013-12-30 MED ORDER — POTASSIUM CHLORIDE CRYS ER 20 MEQ PO TBCR
40.0000 meq | EXTENDED_RELEASE_TABLET | Freq: Once | ORAL | Status: AC
  Administered 2013-12-30: 40 meq via ORAL
  Filled 2013-12-30: qty 2

## 2013-12-30 MED ORDER — ONDANSETRON HCL 4 MG/2ML IJ SOLN
4.0000 mg | Freq: Once | INTRAMUSCULAR | Status: AC
  Administered 2013-12-30: 4 mg via INTRAVENOUS
  Filled 2013-12-30: qty 2

## 2013-12-30 MED ORDER — SODIUM CHLORIDE 0.9 % IV SOLN
1000.0000 mL | INTRAVENOUS | Status: DC
  Administered 2013-12-31: 1000 mL via INTRAVENOUS
  Administered 2013-12-31: 14:00:00 via INTRAVENOUS

## 2013-12-30 NOTE — ED Notes (Signed)
Pt states that he has an upper ABD hernia. Pt states it "flares up every now and then." States he has been vomiting since Friday from the pain.

## 2013-12-30 NOTE — ED Provider Notes (Addendum)
CSN: [id number]     Arrival date & time 12/30/13  1948 History   First MD Initiated Contact with Patient 12/30/13 2037     Chief Complaint  Patient presents with  . Hernia  . Emesis    HPI Patient presents to the emergency room with complaints of upper abdominal pain. Patient states symptoms started on Friday. He has had multiple episodes of vomiting maybe 20 times today. He also has had pain in his upper abdomen it radiates towards his lower abdomen. He has noticed that when he urinates he also had some increasing pain. Has not been able to eat or drink without having more vomiting and pain. He denies any fevers or chills. No coughing no shortness of breath. Patient does have a history of recurrent upper abdominal pain problems. He has had issues with ulcers as well as a ventral hernia.  He said his gallbladder removed previously. He also has history of prior laparotomy. Past Medical History  Diagnosis Date  . Hyperlipidemia   . Hypertension   . Depression   . Allergy   . Anxiety   . ALLERGIC RHINITIS 01/25/2007  . ANXIETY DISORDER, GENERALIZED 01/25/2007  . DEPRESSION 01/25/2007  . ESOPHAGITIS 12/28/2007  . GERD 01/25/2007  . GLUCOSE INTOLERANCE, HX OF 01/25/2007  . HEMORRHOIDS, INTERNAL, WITH BLEEDING 04/26/2008  . HYPERLIPIDEMIA 12/27/2007  . HYPERTENSION, ESSENTIAL NOS 01/25/2007  . Hyperthyroidism 08/11/2010  . HYPERTROPHY PROSTATE W/UR OBST & OTH LUTS 02/27/2010  . 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 LOW BACK PAIN, CHRONIC 11/08/2007  . 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 SCOLIOSIS NEC 01/25/2007  . Sebaceous cyst 08/10/2010  . SMOKER 08/05/2009  . UPPER GASTROINTESTINAL HEMORRHAGE 01/25/2007  . Rectal abscess   . Pancreatitis   . Fatty liver 10/09/2010  . Hypokalemia   . Hiatal hernia   . Heart murmur     hx of   . Arthritis    Past Surgical History  Procedure Laterality Date  . Excision of thyroid mass    . Abdominal exploration surgery  1988  . Hernia repair     ventral  . Cholecystectomy  05/20/08    Dr. Zella Richer  . Excision of scalp lesion  07/2009  . Shoulder surgery      right  . Ganglion cyst excision      right foot x 2  . Incisional hernia repair  12/01/2011    Procedure: LAPAROSCOPIC INCISIONAL HERNIA;  Surgeon: Harl Bowie, MD;  Location: [facility];  Service: General;  Laterality: N/A;   Family History  Problem Relation Age of Onset  . Hypertension Father   . Hypertension Mother   . Prostate cancer Other   . Colon cancer Neg Hx    History  Substance Use Topics  . Smoking status: Current Every Day Smoker -- 1.50 packs/day for 30 years    Types: Cigarettes  . Smokeless tobacco: Not on file  . Alcohol Use: No    Review of Systems  All other systems reviewed and are negative.     Allergies  Celecoxib; Iohexol; and Metoclopramide hcl  Home Medications   Prior to Admission medications   Medication Sig Start Date End Date Taking? Authorizing Provider  alum & mag hydroxide-simeth (MAALOX/MYLANTA) 200-200-20 MG/5ML suspension Take 30 mLs by mouth every 6 (six) hours as needed for indigestion or heartburn (for upset stomach).   Yes Historical Provider, MD  amLODipine (NORVASC) 10 MG tablet Take 1 tablet (10 mg total) by mouth daily. 11/22/13  Yes Aleksei Plotnikov V, MD  benztropine (COGENTIN) 0.5 MG tablet Take 0.5 mg by mouth 2 (two) times daily.  12/19/13  Yes Historical Provider, MD  calcium carbonate (TUMS - DOSED IN MG ELEMENTAL CALCIUM) 500 MG chewable tablet Chew 2 tablets by mouth daily as needed for indigestion or heartburn (for upset stomach).   Yes Historical Provider, MD  esomeprazole (NEXIUM) 20 MG capsule Take 20 mg by mouth daily at 12 noon.   Yes Historical Provider, MD  fluPHENAZine (PROLIXIN) 10 MG tablet Take 10 mg by mouth at bedtime.   Yes Historical Provider, MD  naproxen sodium (ANAPROX) 220 MG tablet Take 220 mg by mouth 2 (two) times daily as needed (pain).   Yes Historical Provider, MD  potassium chloride  SA (K-DUR,KLOR-CON) 20 MEQ tablet Take 1 tablet (20 mEq total) by mouth daily. 10/04/13  Yes Wandra Arthurs, MD  promethazine (PHENERGAN) 25 MG tablet Take 1 tablet (25 mg total) by mouth every 6 (six) hours as needed for nausea or vomiting. 10/04/13  Yes Wandra Arthurs, MD   BP 137/82  Pulse 111  Temp(Src) 98.8 F (37.1 C) (Oral)  Resp 20  Ht 5\' 9"  (1.753 m)  Wt 229 lb (103.874 kg)  BMI 33.80 kg/m2  SpO2 100% Physical Exam  Nursing note and vitals reviewed. Constitutional: He appears well-developed and well-nourished. No distress.  HENT:  Head: Normocephalic and atraumatic.  Right Ear: External ear normal.  Left Ear: External ear normal.  Eyes: Conjunctivae are normal. Right eye exhibits no discharge. Left eye exhibits no discharge. No scleral icterus.  Neck: Neck supple. No tracheal deviation present.  Cardiovascular: Normal rate, regular rhythm and intact distal pulses.   Pulmonary/Chest: Effort normal and breath sounds normal. No stridor. No respiratory distress. He has no wheezes. He has no rales.  Abdominal: Soft. Bowel sounds are normal. He exhibits no distension and no mass. There is tenderness in the epigastric area. There is no rigidity, no rebound and no guarding. A hernia is present. Hernia confirmed positive in the ventral area.  Soft reducible ventral hernia  Musculoskeletal: He exhibits no edema and no tenderness.  Neurological: He is alert. He has normal strength. No cranial nerve deficit (no facial droop, extraocular movements intact, no slurred speech) or sensory deficit. He exhibits normal muscle tone. He displays no seizure activity. Coordination normal.  Skin: Skin is warm and dry. No rash noted.  Psychiatric: He has a normal mood and affect.    ED Course  Procedures (including critical care time) Labs Review Labs Reviewed  CBC WITH DIFFERENTIAL - Abnormal; Notable for the following:    WBC 13.7 (*)    Neutrophils Relative % 85 (*)    Neutro Abs 11.7 (*)     Lymphocytes Relative 10 (*)    All other components within normal limits  COMPREHENSIVE METABOLIC PANEL - Abnormal; Notable for the following:    Potassium 3.0 (*)    Glucose, Bld 164 (*)    Total Protein 8.5 (*)    GFR calc non Af Amer 62 (*)    GFR calc Af Amer 72 (*)    Anion gap 21 (*)    All other components within normal limits  LIPASE, BLOOD  URINALYSIS, ROUTINE W REFLEX MICROSCOPIC    Imaging Review Dg Abd Acute W/chest  12/30/2013   CLINICAL DATA:  Vomiting.  History of smoking.  EXAM: ACUTE ABDOMEN SERIES (ABDOMEN 2 VIEW & CHEST 1 VIEW)  COMPARISON:  Chest and abdominal radiographs  performed 10/04/2013  FINDINGS: The lungs are well-aerated and clear. There is no evidence of focal opacification, pleural effusion or pneumothorax. The cardiomediastinal silhouette is within normal limits.  The visualized bowel gas pattern is unremarkable. Scattered stool and air are seen within the colon; there is no evidence of small bowel dilatation to suggest obstruction. No free intra-abdominal air is identified on the provided upright view. Clips are noted within the right upper quadrant, reflecting prior cholecystectomy.  No acute osseous abnormalities are seen; the sacroiliac joints are unremarkable in appearance.  IMPRESSION: 1. Unremarkable bowel gas pattern; no free intra-abdominal air seen. 2. No acute cardiopulmonary process identified.   Electronically Signed   By: Garald Balding M.D.   On: 12/30/2013 22:21    Medications  0.9 %  sodium chloride infusion (0 mLs Intravenous Stopped 12/30/13 2318)    Followed by  0.9 %  sodium chloride infusion (not administered)  HYDROmorphone (DILAUDID) injection 1 mg (1 mg Intravenous Given 12/30/13 2123)  ondansetron (ZOFRAN) injection 4 mg (4 mg Intravenous Given 12/30/13 2123)  potassium chloride SA (K-DUR,KLOR-CON) CR tablet 40 mEq (40 mEq Oral Given 12/30/13 2316)     MDM   Final diagnoses:  Epigastric pain  Non-intractable vomiting with nausea,  vomiting of unspecified type    Patient's symptoms improved with IV fluids and pain medications. Symptoms are resolved and is now tolerating oral fluids. He was given oral potassium supplements. X-rays are not showing any evidence of obstruction.  No incarcerated hernia on exam  He is having recurrent episodes  in the past similar to this.  At this time there does not appear to be any evidence of an acute emergency medical condition and the patient appears stable for discharge with appropriate outpatient follow up.     Dorie Rank, MD 12/30/13 2333  Pt started to vomit again as he was getting ready to be released.  Will CT to evaluate for occult obstruction  Dorie Rank, MD 12/30/13 [phone]

## 2013-12-30 NOTE — Discharge Instructions (Signed)

## 2013-12-31 ENCOUNTER — Emergency Department (HOSPITAL_COMMUNITY): Payer: Medicare Other

## 2013-12-31 ENCOUNTER — Encounter (HOSPITAL_COMMUNITY)
Admission: EM | Disposition: A | Discharge status: Home / Self Care | Payer: Self-pay | Source: Home / Self Care | Attending: Family Medicine

## 2013-12-31 ENCOUNTER — Encounter (HOSPITAL_COMMUNITY): Payer: Medicare Other | Admitting: Anesthesiology

## 2013-12-31 ENCOUNTER — Inpatient Hospital Stay (HOSPITAL_COMMUNITY): Payer: Medicare Other | Admitting: Anesthesiology

## 2013-12-31 DIAGNOSIS — E876 Hypokalemia: Secondary | ICD-10-CM

## 2013-12-31 DIAGNOSIS — I10 Essential (primary) hypertension: Secondary | ICD-10-CM | POA: Diagnosis present

## 2013-12-31 DIAGNOSIS — Z79899 Other long term (current) drug therapy: Secondary | ICD-10-CM | POA: Diagnosis not present

## 2013-12-31 DIAGNOSIS — R112 Nausea with vomiting, unspecified: Secondary | ICD-10-CM | POA: Diagnosis present

## 2013-12-31 DIAGNOSIS — D72829 Elevated white blood cell count, unspecified: Secondary | ICD-10-CM | POA: Diagnosis present

## 2013-12-31 DIAGNOSIS — R1013 Epigastric pain: Secondary | ICD-10-CM

## 2013-12-31 DIAGNOSIS — E785 Hyperlipidemia, unspecified: Secondary | ICD-10-CM | POA: Diagnosis present

## 2013-12-31 DIAGNOSIS — K2100 Gastro-esophageal reflux disease with esophagitis, without bleeding: Secondary | ICD-10-CM | POA: Diagnosis present

## 2013-12-31 DIAGNOSIS — F2089 Other schizophrenia: Secondary | ICD-10-CM

## 2013-12-31 DIAGNOSIS — T189XXA Foreign body of alimentary tract, part unspecified, initial encounter: Secondary | ICD-10-CM | POA: Insufficient documentation

## 2013-12-31 DIAGNOSIS — T182XXA Foreign body in stomach, initial encounter: Secondary | ICD-10-CM | POA: Diagnosis present

## 2013-12-31 DIAGNOSIS — F209 Schizophrenia, unspecified: Secondary | ICD-10-CM | POA: Diagnosis present

## 2013-12-31 DIAGNOSIS — IMO0002 Reserved for concepts with insufficient information to code with codable children: Secondary | ICD-10-CM

## 2013-12-31 DIAGNOSIS — F3289 Other specified depressive episodes: Secondary | ICD-10-CM | POA: Diagnosis present

## 2013-12-31 DIAGNOSIS — F329 Major depressive disorder, single episode, unspecified: Secondary | ICD-10-CM | POA: Diagnosis present

## 2013-12-31 DIAGNOSIS — F411 Generalized anxiety disorder: Secondary | ICD-10-CM | POA: Diagnosis present

## 2013-12-31 DIAGNOSIS — K219 Gastro-esophageal reflux disease without esophagitis: Secondary | ICD-10-CM

## 2013-12-31 DIAGNOSIS — F17200 Nicotine dependence, unspecified, uncomplicated: Secondary | ICD-10-CM | POA: Diagnosis present

## 2013-12-31 DIAGNOSIS — Z6833 Body mass index (BMI) 33.0-33.9, adult: Secondary | ICD-10-CM | POA: Diagnosis not present

## 2013-12-31 DIAGNOSIS — R634 Abnormal weight loss: Secondary | ICD-10-CM | POA: Diagnosis present

## 2013-12-31 HISTORY — PX: ESOPHAGOGASTRODUODENOSCOPY: SHX5428

## 2013-12-31 LAB — I-STAT CHEM 8, ED
BUN: 5 mg/dL — AB (ref 6–23)
CHLORIDE: 101 meq/L (ref 96–112)
Calcium, Ion: 1.11 mmol/L — ABNORMAL LOW (ref 1.12–1.23)
Creatinine, Ser: 1.2 mg/dL (ref 0.50–1.35)
Glucose, Bld: 130 mg/dL — ABNORMAL HIGH (ref 70–99)
HCT: 46 % (ref 39.0–52.0)
Hemoglobin: 15.6 g/dL (ref 13.0–17.0)
POTASSIUM: 3 meq/L — AB (ref 3.7–5.3)
SODIUM: 139 meq/L (ref 137–147)
TCO2: 25 mmol/L (ref 0–100)

## 2013-12-31 LAB — COMPREHENSIVE METABOLIC PANEL
ALT: 9 U/L (ref 0–53)
AST: 11 U/L (ref 0–37)
Albumin: 3.6 g/dL (ref 3.5–5.2)
Alkaline Phosphatase: 69 U/L (ref 39–117)
Anion gap: 15 (ref 5–15)
BUN: 8 mg/dL (ref 6–23)
CO2: 24 meq/L (ref 19–32)
Calcium: 9 mg/dL (ref 8.4–10.5)
Chloride: 100 mEq/L (ref 96–112)
Creatinine, Ser: 1.02 mg/dL (ref 0.50–1.35)
GFR calc Af Amer: >90 mL/min (ref >90)
GFR, EST NON AFRICAN AMERICAN: 80 mL/min — AB (ref >90)
Glucose, Bld: 112 mg/dL — ABNORMAL HIGH (ref 70–99)
Potassium: 3.3 mEq/L — ABNORMAL LOW (ref 3.7–5.3)
SODIUM: 139 meq/L (ref 137–147)
TOTAL PROTEIN: 7 g/dL (ref 6.0–8.3)
Total Bilirubin: 0.5 mg/dL (ref 0.3–1.2)

## 2013-12-31 LAB — CBC WITH DIFFERENTIAL/PLATELET
Basophils Absolute: 0 10*3/uL (ref 0.0–0.1)
Basophils Relative: 0 % (ref 0–1)
Eosinophils Absolute: 0 10*3/uL (ref 0.0–0.7)
Eosinophils Relative: 0 % (ref 0–5)
HCT: 38.9 % — ABNORMAL LOW (ref 39.0–52.0)
HEMOGLOBIN: 13.7 g/dL (ref 13.0–17.0)
Lymphocytes Relative: 16 % (ref 12–46)
Lymphs Abs: 2.4 10*3/uL (ref 0.7–4.0)
MCH: 29.4 pg (ref 26.0–34.0)
MCHC: 35.2 g/dL (ref 30.0–36.0)
MCV: 83.5 fL (ref 78.0–100.0)
MONOS PCT: 9 % (ref 3–12)
Monocytes Absolute: 1.3 10*3/uL — ABNORMAL HIGH (ref 0.1–1.0)
NEUTROS ABS: 11.1 10*3/uL — AB (ref 1.7–7.7)
NEUTROS PCT: 75 % (ref 43–77)
Platelets: 292 10*3/uL (ref 150–400)
RBC: 4.66 MIL/uL (ref 4.22–5.81)
RDW: 13.9 % (ref 11.5–15.5)
WBC: 14.8 10*3/uL — AB (ref 4.0–10.5)

## 2013-12-31 LAB — URINALYSIS, ROUTINE W REFLEX MICROSCOPIC
BILIRUBIN URINE: NEGATIVE
Glucose, UA: NEGATIVE mg/dL
HGB URINE DIPSTICK: NEGATIVE
KETONES UR: NEGATIVE mg/dL
Leukocytes, UA: NEGATIVE
Nitrite: NEGATIVE
Protein, ur: 30 mg/dL — AB
Specific Gravity, Urine: 1.02 (ref 1.005–1.030)
UROBILINOGEN UA: 0.2 mg/dL (ref 0.0–1.0)
pH: 6.5 (ref 5.0–8.0)

## 2013-12-31 LAB — URINE MICROSCOPIC-ADD ON

## 2013-12-31 LAB — PROTIME-INR
INR: 1.05 (ref 0.00–1.49)
Prothrombin Time: 13.7 seconds (ref 11.6–15.2)

## 2013-12-31 SURGERY — ESOPHAGOGASTRODUODENOSCOPY (EGD) WITH PROPOFOL
Anesthesia: Monitor Anesthesia Care

## 2013-12-31 SURGERY — EGD (ESOPHAGOGASTRODUODENOSCOPY)
Anesthesia: General

## 2013-12-31 MED ORDER — FENTANYL CITRATE 0.05 MG/ML IJ SOLN
INTRAMUSCULAR | Status: AC
  Filled 2013-12-31: qty 5

## 2013-12-31 MED ORDER — SUCCINYLCHOLINE CHLORIDE 20 MG/ML IJ SOLN
INTRAMUSCULAR | Status: DC | PRN
  Administered 2013-12-31: 100 mg via INTRAVENOUS

## 2013-12-31 MED ORDER — ONDANSETRON HCL 4 MG/2ML IJ SOLN
INTRAMUSCULAR | Status: AC
  Filled 2013-12-31: qty 2

## 2013-12-31 MED ORDER — SODIUM CHLORIDE 0.9 % IV SOLN: INTRAVENOUS | Status: DC

## 2013-12-31 MED ORDER — HEPARIN SODIUM (PORCINE) 5000 UNIT/ML IJ SOLN
5000.0000 [IU] | Freq: Three times a day (TID) | INTRAMUSCULAR | Status: DC
  Administered 2013-12-31 – 2014-01-01 (×2): 5000 [IU] via SUBCUTANEOUS
  Filled 2013-12-31 (×7): qty 1

## 2013-12-31 MED ORDER — FLUPHENAZINE HCL 10 MG PO TABS
10.0000 mg | ORAL_TABLET | Freq: Every day | ORAL | Status: DC
  Administered 2013-12-31: 10 mg via ORAL
  Filled 2013-12-31 (×3): qty 1

## 2013-12-31 MED ORDER — NICOTINE 10 MG IN INHA: 1.0000 | RESPIRATORY_TRACT | Status: DC | PRN

## 2013-12-31 MED ORDER — LIDOCAINE HCL (PF) 2 % IJ SOLN
INTRAMUSCULAR | Status: DC | PRN
  Administered 2013-12-31: 75 mg via INTRADERMAL

## 2013-12-31 MED ORDER — FENTANYL CITRATE 0.05 MG/ML IJ SOLN
INTRAMUSCULAR | Status: DC | PRN
  Administered 2013-12-31 (×2): 100 ug via INTRAVENOUS
  Administered 2013-12-31: 50 ug via INTRAVENOUS

## 2013-12-31 MED ORDER — POTASSIUM CHLORIDE IN NACL 20-0.9 MEQ/L-% IV SOLN
INTRAVENOUS | Status: DC
  Administered 2013-12-31 – 2014-01-01 (×2): via INTRAVENOUS
  Filled 2013-12-31 (×4): qty 1000

## 2013-12-31 MED ORDER — PROPOFOL 10 MG/ML IV BOLUS
INTRAVENOUS | Status: DC | PRN
  Administered 2013-12-31: 200 mg via INTRAVENOUS

## 2013-12-31 MED ORDER — OXYCODONE HCL 5 MG PO TABS
5.0000 mg | ORAL_TABLET | ORAL | Status: DC | PRN
Start: 2013-12-31 — End: 2014-01-01
  Administered 2014-01-01: 5 mg via ORAL
  Filled 2013-12-31: qty 1

## 2013-12-31 MED ORDER — PANTOPRAZOLE SODIUM 40 MG IV SOLR
40.0000 mg | INTRAVENOUS | Status: DC
  Administered 2013-12-31: 40 mg via INTRAVENOUS
  Filled 2013-12-31 (×2): qty 40

## 2013-12-31 MED ORDER — SODIUM CHLORIDE 0.9 % IV SOLN: INTRAVENOUS | Status: DC | PRN

## 2013-12-31 MED ORDER — LIDOCAINE HCL (CARDIAC) 20 MG/ML IV SOLN
INTRAVENOUS | Status: AC
  Filled 2013-12-31: qty 5

## 2013-12-31 MED ORDER — ONDANSETRON HCL 4 MG/2ML IJ SOLN
INTRAMUSCULAR | Status: DC | PRN
Start: 2013-12-31 — End: 2013-12-31
  Administered 2013-12-31: 4 mg via INTRAVENOUS

## 2013-12-31 MED ORDER — ONDANSETRON 8 MG/NS 50 ML IVPB
8.0000 mg | Freq: Four times a day (QID) | INTRAVENOUS | Status: DC | PRN
  Filled 2013-12-31: qty 8

## 2013-12-31 MED ORDER — PROPOFOL 10 MG/ML IV BOLUS
INTRAVENOUS | Status: AC
  Filled 2013-12-31: qty 20

## 2013-12-31 MED ORDER — BENZTROPINE MESYLATE 0.5 MG PO TABS
0.5000 mg | ORAL_TABLET | Freq: Two times a day (BID) | ORAL | Status: DC
  Administered 2013-12-31 – 2014-01-01 (×3): 0.5 mg via ORAL
  Filled 2013-12-31 (×4): qty 1

## 2013-12-31 MED ORDER — ONDANSETRON HCL 4 MG PO TABS
4.0000 mg | ORAL_TABLET | Freq: Four times a day (QID) | ORAL | Status: DC | PRN
  Administered 2013-12-31: 4 mg via ORAL
  Filled 2013-12-31: qty 1

## 2013-12-31 MED ORDER — AMLODIPINE BESYLATE 10 MG PO TABS
10.0000 mg | ORAL_TABLET | Freq: Every day | ORAL | Status: DC
  Administered 2013-12-31 – 2014-01-01 (×2): 10 mg via ORAL
  Filled 2013-12-31 (×2): qty 1

## 2013-12-31 MED ORDER — POTASSIUM CHLORIDE 10 MEQ/100ML IV SOLN
10.0000 meq | INTRAVENOUS | Status: AC
  Administered 2013-12-31 (×2): 10 meq via INTRAVENOUS
  Filled 2013-12-31 (×3): qty 100

## 2013-12-31 SURGICAL SUPPLY — 14 items

## 2013-12-31 NOTE — Progress Notes (Signed)
INITIAL NUTRITION ASSESSMENT  DOCUMENTATION CODES Per approved criteria  -Obesity Unspecified   INTERVENTION: - Diet advancement per MD - RD to continue to monitor   NUTRITION DIAGNOSIS: Inadequate oral intake related to inability to eat as evidenced by NPO.   Goal: Advance diet as tolerated to regular diet  Monitor:  Weights, labs, diet advancement  Reason for Assessment: Malnutrition screening tool   58 y.o. male  Admitting Dx: Foreign body in stomach  ASSESSMENT: Pt with hx of hypertension, schizophrenia, hiatal hernia, GERD.  The patient is presenting with complaints of abdominal pain, nausea, and vomiting. He mentions he has nausea and vomiting for last 3 years occurring on and off. This episode has started 1 week ago and has worsened since Sunday. He complains of pain located in his abdomen in the upper area radiating to lower on the left side. He also complains of acid reflux. Found to have possible syringe in stomach. GI following.   - Met with pt who reports 60 pound weight loss in the 9 months however weight trend shows weight has been stable in past several months - States some of this was intentional with pt not eating for a day at a time in an attempt to lose weight - Also states some of it was unintentional - C/o having flare ups of vomiting in October and April, doesn't know why it only occurs in these months - States he eats 1-2 meals/day at home of baked foods and greens - Recently ate only 1 meal/day due to pain in hernia from eating - Denies any nausea/vomiting today - Hx of drinking Ensure however states he hasn't bought any recently  - C/o being hungry  Potassium low, getting IV and oral replacement   Height: Ht Readings from Last 1 Encounters:  12/30/13 _0  (1.753 m)    Weight: Wt Readings from Last 1 Encounters:  12/30/13 229 lb (103.874 kg)    Ideal Body Weight: 160 lbs   % Ideal Body Weight: 143%  Wt Readings from Last 10 Encounters:   12/30/13 229 lb (103.874 kg)  12/30/13 229 lb (103.874 kg)  12/30/13 229 lb (103.874 kg)  08/24/13 229 lb (103.874 kg)  07/24/13 229 lb (103.874 kg)  07/18/13 234 lb 12.8 oz (106.505 kg)  03/12/13 233 lb 7 oz (105.887 kg)  10/30/12 236 lb (107.049 kg)  05/05/12 237 lb 12.8 oz (107.865 kg)  04/05/12 247 lb (112.038 kg)    Usual Body Weight: 289 lbs per pt  % Usual Body Weight: 79%  BMI:  Body mass index is 33.8 kg/(m^2). Class I obesity   Estimated Nutritional Needs: Kcal: 1850-2050 Protein: 75-90g Fluid: 1.8-2L/day   Skin: intact   Diet Order: NPO  EDUCATION NEEDS: -No education needs identified at this time  No intake or output data in the 24 hours ending 12/31/13 1253  Last BM: 9/12  Labs:   Recent Labs Lab 12/30/13 2043 12/31/13 0206 12/31/13 0815  NA 141 139 139  K 3.0* 3.0* 3.3*  CL 97 101 100  CO2 23  --  24  BUN 7 5* 8  CREATININE 1.26 1.20 1.02  CALCIUM 10.3  --  9.0  GLUCOSE 164* 130* 112*    CBG (last 3)  No results found for this basename: GLUCAP,  in the last 72 hours  Scheduled Meds: . amLODipine  10 mg Oral Daily  . benztropine  0.5 mg Oral BID  . fluPHENAZine  10 mg Oral QHS  . heparin  5,000 Units Subcutaneous 3 times per day  . pantoprazole (PROTONIX) IV  40 mg Intravenous Q24H    Continuous Infusions: . sodium chloride Stopped (12/31/13 0134)  . 0.9 % NaCl with KCl 20 mEq / L      Past Medical History  Diagnosis Date  . Hyperlipidemia   . Hypertension   . Depression   . Allergy   . Anxiety   . ALLERGIC RHINITIS 01/25/2007  . ANXIETY DISORDER, GENERALIZED 01/25/2007  . DEPRESSION 01/25/2007  . ESOPHAGITIS 12/28/2007  . GERD 01/25/2007  . GLUCOSE INTOLERANCE, HX OF 01/25/2007  . HEMORRHOIDS, INTERNAL, WITH BLEEDING 04/26/2008  . HYPERLIPIDEMIA 12/27/2007  . HYPERTENSION, ESSENTIAL NOS 01/25/2007  . Hyperthyroidism 08/11/2010  . HYPERTROPHY PROSTATE W/UR OBST & OTH LUTS 02/27/2010  . 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  LOW BACK PAIN, CHRONIC 11/08/2007  . 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 SCOLIOSIS NEC 01/25/2007  . Sebaceous cyst 08/10/2010  . SMOKER 08/05/2009  . UPPER GASTROINTESTINAL HEMORRHAGE 01/25/2007  . Rectal abscess   . Pancreatitis   . Fatty liver 10/09/2010  . Hypokalemia   . Hiatal hernia   . Heart murmur     hx of   . Arthritis     Past Surgical History  Procedure Laterality Date  . Excision of thyroid mass    . Abdominal exploration surgery  1988  . Hernia repair      ventral  . Cholecystectomy  05/20/08    Dr. Zella Richer  . Excision of scalp lesion  07/2009  . Shoulder surgery      right  . Ganglion cyst excision      right foot x 2  . Incisional hernia repair  12/01/2011    Procedure: LAPAROSCOPIC INCISIONAL HERNIA;  Surgeon: Harl Bowie, MD;  Location: [facility];  Service: General;  Laterality: N/A;    Carlis Stable MS, [place], [place] Pager [phone] Weekend/After Hours Pager

## 2013-12-31 NOTE — Op Note (Signed)
[facility] [place], [address]   ENDOSCOPY PROCEDURE REPORT  PATIENT: [name], [name]  MR#: [id number] BIRTHDATE: January 01, 1956 , 57  yrs. old GENDER: Male ENDOSCOPIST: Gatha Mayer, MD, [facility] PROCEDURE DATE:  12/31/2013 PROCEDURE:  EGD, diagnostic ASA CLASS:     Class III INDICATIONS:  vomiting, ? foreign body in stomach by CT. MEDICATIONS: See Anesthesia Report. TOPICAL ANESTHETIC: none  DESCRIPTION OF PROCEDURE: After the risks benefits and alternatives of the procedure were thoroughly explained, informed consent was obtained.  The Pentax Gastroscope [id number] endoscope was introduced through the mouth and advanced to the second portion of the duodenum. Without limitations.  The instrument was slowly withdrawn as the mucosa was fully examined.        ESOPHAGUS: Reflux esophagitis was found at the gastroesophageal junction.   A 4 cm hiatal hernia was noted.  Retroflexed views revealed a hiatal hernia.   Otherwise normal and no foreign body, The scope was then withdrawn from the patient and the procedure completed.  COMPLICATIONS: There were no complications. ENDOSCOPIC IMPRESSION: 1.   Esophagitis consistent with reflux esophagitis at the gastroesophageal junction 2.   4 cm hiatal hernia  - otherwise normal and no foreign bodies - suspect CT artifact and not real  RECOMMENDATIONS: PPI daily (if he was taking it as outpatient then go to bid now) Advance diet Home soon F/U GI Olevia Perches or PA/NP) in 1 month Consider repeat EGD to document healing of esophagitis in 3 months    eSigned:  Gatha Mayer, MD, [facility] 12/31/2013 2:16 PM

## 2013-12-31 NOTE — H&P (Signed)
[facility] History and Physical  Patient: [name]  GHW:[id number]  DOB: 1955-12-06  DOS: the patient was seen and examined on 12/31/2013 PCP: Walker Kehr, MD  Chief Complaint: Abdominal pain nausea and vomiting  HPI: [name] is a 58 y.o. male with Past medical history of hypertension, schizophrenia, hiatal hernia, GERD. The patient is presenting with complaints of abdominal pain nausea and vomiting. He mentions he has nausea and vomiting for last 3 years occurring on and off. This episode has started 1 week ago and has worsened since Sunday. He complains of pain located in his abdomen in the upper area radiating to lower on the left side. He also complains of acid reflux. He complains of nausea at the time of my evaluation in mentions vomited roughly 20 times. Denies any blood in his vomitus in Last bowel movement was 2 days ago. He mentions he is compliant with all his medication he did He denies swallowing any anything non-edible. He denies any cough shortness of breath chest pain dizziness lightheadedness or focal deficit. No recent change in his medication.  The patient is coming from home. And at his baseline independent for most of his ADL.  Review of Systems: as mentioned in the history of present illness.  A Comprehensive review of the other systems is negative.  Past Medical History  Diagnosis Date  . Hyperlipidemia   . Hypertension   . Depression   . Allergy   . Anxiety   . ALLERGIC RHINITIS 01/25/2007  . ANXIETY DISORDER, GENERALIZED 01/25/2007  . DEPRESSION 01/25/2007  . ESOPHAGITIS 12/28/2007  . GERD 01/25/2007  . GLUCOSE INTOLERANCE, HX OF 01/25/2007  . HEMORRHOIDS, INTERNAL, WITH BLEEDING 04/26/2008  . HYPERLIPIDEMIA 12/27/2007  . HYPERTENSION, ESSENTIAL NOS 01/25/2007  . Hyperthyroidism 08/11/2010  . HYPERTROPHY PROSTATE W/UR OBST & OTH LUTS 02/27/2010  . 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 LOW BACK PAIN, CHRONIC 11/08/2007  . SCHIZOPHRENIA  NEC, CHRONIC 01/25/2007  . SCOLIOSIS NEC 01/25/2007  . Sebaceous cyst 08/10/2010  . SMOKER 08/05/2009  . UPPER GASTROINTESTINAL HEMORRHAGE 01/25/2007  . Rectal abscess   . Pancreatitis   . Fatty liver 10/09/2010  . Hypokalemia   . Hiatal hernia   . Heart murmur     hx of   . Arthritis    Past Surgical History  Procedure Laterality Date  . Excision of thyroid mass    . Abdominal exploration surgery  1988  . Hernia repair      ventral  . Cholecystectomy  05/20/08    Dr. Zella Richer  . Excision of scalp lesion  07/2009  . Shoulder surgery      right  . Ganglion cyst excision      right foot x 2  . Incisional hernia repair  12/01/2011    Procedure: LAPAROSCOPIC INCISIONAL HERNIA;  Surgeon: Harl Bowie, MD;  Location: [facility];  Service: General;  Laterality: N/A;   Social History:  reports that he has been smoking Cigarettes.  He has a 45 pack-year smoking history. He does not have any smokeless tobacco history on file. He reports that he does not drink alcohol or use illicit drugs.  Allergies  Allergen Reactions  . Celecoxib     REACTION: rectal bleeding  . Iohexol     Affects nerves: triggers schizophrenia  . Metoclopramide Hcl     Affects nerves: triggers schizophrenia    Family History  Problem Relation Age of Onset  . Hypertension Father   . Hypertension Mother   .  Prostate cancer Other   . Colon cancer Neg Hx     Prior to Admission medications   Medication Sig Start Date End Date Taking? Authorizing Provider  alum & mag hydroxide-simeth (MAALOX/MYLANTA) 200-200-20 MG/5ML suspension Take 30 mLs by mouth every 6 (six) hours as needed for indigestion or heartburn (for upset stomach).   Yes Historical Provider, MD  amLODipine (NORVASC) 10 MG tablet Take 1 tablet (10 mg total) by mouth daily. 11/22/13  Yes Aleksei Plotnikov V, MD  benztropine (COGENTIN) 0.5 MG tablet Take 0.5 mg by mouth 2 (two) times daily.  12/19/13  Yes Historical Provider, MD  calcium carbonate (TUMS -  DOSED IN MG ELEMENTAL CALCIUM) 500 MG chewable tablet Chew 2 tablets by mouth daily as needed for indigestion or heartburn (for upset stomach).   Yes Historical Provider, MD  esomeprazole (NEXIUM) 20 MG capsule Take 20 mg by mouth daily at 12 noon.   Yes Historical Provider, MD  fluPHENAZine (PROLIXIN) 10 MG tablet Take 10 mg by mouth at bedtime.   Yes Historical Provider, MD  naproxen sodium (ANAPROX) 220 MG tablet Take 220 mg by mouth 2 (two) times daily as needed (pain).   Yes Historical Provider, MD  potassium chloride SA (K-DUR,KLOR-CON) 20 MEQ tablet Take 1 tablet (20 mEq total) by mouth daily. 10/04/13  Yes Wandra Arthurs, MD  promethazine (PHENERGAN) 25 MG tablet Take 1 tablet (25 mg total) by mouth every 6 (six) hours as needed for nausea or vomiting. 12/30/13   Dorie Rank, MD    Physical Exam: Filed Vitals:   12/30/13 2002 12/31/13 0531  BP: 137/82 135/76  Pulse: 111 94  Temp: 98.8 F (37.1 C) 98.4 F (36.9 C)  TempSrc: Oral Oral  Resp: 20 17  Height: 5\' 9"  (1.753 m)   Weight: 103.874 kg (229 lb)   SpO2: 100% 98%    General: Alert, Awake and Oriented to Time, Place and Person. Appear in mild distress Eyes: PERRL ENT: Oral Mucosa clear moist. Neck: No JVD Cardiovascular: S1 and S2 Present, no Murmur, Peripheral Pulses Present Respiratory: Bilateral Air entry equal and Decreased, Clear to Auscultation, no Crackles, no wheezes Abdomen: Bowel Sound Present, Soft and diffuse upper abdomen tender Skin: No Rash Extremities: No Pedal edema, no calf tenderness Neurologic: Grossly no focal neuro deficit.  Labs on Admission:  CBC:  Recent Labs Lab 12/30/13 2043 12/31/13 0206  WBC 13.7*  --   NEUTROABS 11.7*  --   HGB 16.4 15.6  HCT 45.8 46.0  MCV 84.0  --   PLT 329  --     CMP     Component Value Date/Time   NA 139 12/31/2013 0206   K 3.0* 12/31/2013 0206   CL 101 12/31/2013 0206   CO2 23 12/30/2013 2043   GLUCOSE 130* 12/31/2013 0206   BUN 5* 12/31/2013 0206   CREATININE  1.20 12/31/2013 0206   CALCIUM 10.3 12/30/2013 2043   PROT 8.5* 12/30/2013 2043   ALBUMIN 4.4 12/30/2013 2043   AST 12 12/30/2013 2043   ALT <5 12/30/2013 2043   ALKPHOS 81 12/30/2013 2043   BILITOT 0.4 12/30/2013 2043   GFRNONAA 62* 12/30/2013 2043   GFRAA 72* 12/30/2013 2043     Recent Labs Lab 12/30/13 2043  LIPASE 20   No results found for this basename: AMMONIA,  in the last 168 hours  No results found for this basename: CKTOTAL, CKMB, CKMBINDEX, TROPONINI,  in the last 168 hours BNP (last 3 results) No results found  for this basename: PROBNP,  in the last 8760 hours  Radiological Exams on Admission: Ct Abdomen Pelvis Wo Contrast  12/31/2013   CLINICAL DATA:  Nausea, vomiting. Upper abdominal pain radiates to lower abdomen. White cell count 13.7. Prior cholecystectomy. History of to ventral hernia repairs. Exploratory abdominal surgery. History of hypertension and GERD, hiatal hernia, fatty liver, pancreatitis, upper GI bleed, internal hemorrhoids.  EXAM: CT ABDOMEN AND PELVIS WITHOUT CONTRAST  TECHNIQUE: Multidetector CT imaging of the abdomen and pelvis was performed following the standard protocol without IV contrast.  COMPARISON:  CT of the abdomen pelvis on 07/22/2013  FINDINGS: Lower chest: The lung bases are unremarkable in appearance. The heart size is normal.  Upper abdomen: No focal abnormality identified within the liver, spleen, pancreas, or adrenal glands. The gallbladder is surgically absent. No intrarenal or ureteral stones are identified.  Bowel: Within the stomach there is a linear low-density structure which contains small locules of gas. The findings raise a question of a linear foreign body. Structure measures approximately 12 cm in length, 0.3 cm in diameter. The small bowel loops are normal in appearance. The appendix is not well seen. There is no evidence for acute appendicitis. Colonic loops are nondilated. There is minimal deposition of fat within the colonic wall, a  finding which can be associated with chronic colitis.  Pelvis: The urinary bladder, of seminal vesicles have a normal appearance. There is prostatic enlargement.  Retroperitoneum: There is atherosclerotic calcification of the abdominal aorta. No aneurysm. Small porta hepatis nodes are noted, nonspecific in appearance.  Abdominal wall: There is a midline fat containing hernia, unchanged in appearance. No evidence for associated bowel obstruction.  Osseous structures: Unremarkable.  IMPRESSION: 1. Suspect a linear foreign body within the stomach, 12 cm in length. 2. No evidence for bowel obstruction.  Prostatic enlargement. 3. Fat deposition within the distal colon, raising the question of chronic colitis. 4. The findings were discussed with Dr. Claudine Mouton On 12/31/2013 at 1:58 am.   Electronically Signed   By: Shon Hale M.D.   On: 12/31/2013 01:59   Dg Abd Acute W/chest  12/30/2013   CLINICAL DATA:  Vomiting.  History of smoking.  EXAM: ACUTE ABDOMEN SERIES (ABDOMEN 2 VIEW & CHEST 1 VIEW)  COMPARISON:  Chest and abdominal radiographs performed 10/04/2013  FINDINGS: The lungs are well-aerated and clear. There is no evidence of focal opacification, pleural effusion or pneumothorax. The cardiomediastinal silhouette is within normal limits.  The visualized bowel gas pattern is unremarkable. Scattered stool and air are seen within the colon; there is no evidence of small bowel dilatation to suggest obstruction. No free intra-abdominal air is identified on the provided upright view. Clips are noted within the right upper quadrant, reflecting prior cholecystectomy.  No acute osseous abnormalities are seen; the sacroiliac joints are unremarkable in appearance.  IMPRESSION: 1. Unremarkable bowel gas pattern; no free intra-abdominal air seen. 2. No acute cardiopulmonary process identified.   Electronically Signed   By: Garald Balding M.D.   On: 12/30/2013 22:21    Assessment/Plan Principal Problem:   Foreign body in  stomach Active Problems:   SCHIZOPHRENIA NEC, CHRONIC   HYPERTENSION, ESSENTIAL NOS   GERD   Weight loss   1. Foreign body in stomach The patient is presenting with complaints of abdominal pain nausea and vomiting. Initial x-ray was negative for any acute abnormality but a CT scan showed that the patient has roughly a 12 cm size foreign body appearing like a syringe. Patient denies any  swallowing in the past. Prior CT scan in April 15 was negative for any similar abnormality. With this the patient is currently admitted to the [facility] unit. Gastroenterology was discussed with the following the patient and recommend to keep the patient n.p.o. At present I will give him IV fluids. IV Zofran as needed. IV Protonix. Continue with his other home medications. Patient has been given IV Dilaudid in the ER but at present he appears stable therefore I would use oral OxyIR for pain management.  2. Schizophrenia. Continue home medication. At present no agitation no abnormality identified.  Consults: Gastroenterology  DVT Prophylaxis: subcutaneous Heparin nutrition: N.p.o. except medication  Code Status: Full  Family Communication: Family was present at bedside, opportunity was given to ask question and all questions were answered satisfactorily at the time of interview. Disposition: Admitted to inpatient in med-surge unit.  Author: Berle Mull, MD [facility] Pager: [phone] 12/31/2013, 6:42 AM    If 7PM-7AM, please contact night-coverage [email or web address] Password [email or web address]  **Disclaimer: This note may have been dictated with voice recognition software. Similar sounding words can inadvertently be transcribed and this note may contain transcription errors which may not have been corrected upon publication of note.**

## 2013-12-31 NOTE — Progress Notes (Signed)
[facility] PROGRESS NOTE  [name] ZDG:[id number] DOB: [birth year] DOA: 12/30/2013 PCP: Walker Kehr, MD  Assessment/Plan:   Foreign body in stomach- GI has been consulted for possible endoscopy today.Continue with zofran prn for nausea and vomiting.  Hypokalemia Replace potassium. Check BMP in am.    Code Status: Full code Family Communication:  Discussed with wife at bedside Disposition Plan: Home when stable   Consultants:  GI  Procedures:  None  Antibiotics:  None  HPI/Subjective: Patient seen and examined, admitted with abdominal pain, nausea and vomiting. CT abdomen revealed possible foreign body in the stomach.  Objective: Filed Vitals:   12/31/13 1615  BP: 121/64  Pulse: 79  Temp: 99 F (37.2 C)  Resp: 18    Intake/Output Summary (Last 24 hours) at 12/31/13 1625 Last data filed at 12/31/13 1418  Gross per 24 hour  Intake    400 ml  Output      0 ml  Net    400 ml   Filed Weights   12/30/13 2002  Weight: 103.874 kg (229 lb)    Exam:   General:  Appear in no acute distress  Cardiovascular: s1s2 RRR  Respiratory: Clear bilaterally  Abdomen: Soft, nontender  Musculoskeletal: *No edema of the lower extremities  Data Reviewed: Basic Metabolic Panel:  Recent Labs Lab 12/30/13 2043 12/31/13 0206 12/31/13 0815  NA 141 139 139  K 3.0* 3.0* 3.3*  CL 97 101 100  CO2 23  --  24  GLUCOSE 164* 130* 112*  BUN 7 5* 8  CREATININE 1.26 1.20 1.02  CALCIUM 10.3  --  9.0   Liver Function Tests:  Recent Labs Lab 12/30/13 2043 12/31/13 0815  AST 12 11  ALT <5 9  ALKPHOS 81 69  BILITOT 0.4 0.5  PROT 8.5* 7.0  ALBUMIN 4.4 3.6    Recent Labs Lab 12/30/13 2043  LIPASE 20   No results found for this basename: AMMONIA,  in the last 168 hours CBC:  Recent Labs Lab 12/30/13 2043 12/31/13 0206 12/31/13 0815  WBC 13.7*  --  14.8*  NEUTROABS 11.7*  --  11.1*  HGB 16.4 15.6 13.7  HCT 45.8 46.0 38.9*  MCV 84.0  --   83.5  PLT 329  --  292   Cardiac Enzymes: No results found for this basename: CKTOTAL, CKMB, CKMBINDEX, TROPONINI,  in the last 168 hours BNP (last 3 results) No results found for this basename: PROBNP,  in the last 8760 hours CBG: No results found for this basename: GLUCAP,  in the last 168 hours  No results found for this or any previous visit (from the past 240 hour(s)).   Studies: Ct Abdomen Pelvis Wo Contrast  12/31/2013   CLINICAL DATA:  Nausea, vomiting. Upper abdominal pain radiates to lower abdomen. White cell count 13.7. Prior cholecystectomy. History of to ventral hernia repairs. Exploratory abdominal surgery. History of hypertension and GERD, hiatal hernia, fatty liver, pancreatitis, upper GI bleed, internal hemorrhoids.  EXAM: CT ABDOMEN AND PELVIS WITHOUT CONTRAST  TECHNIQUE: Multidetector CT imaging of the abdomen and pelvis was performed following the standard protocol without IV contrast.  COMPARISON:  CT of the abdomen pelvis on 07/22/2013  FINDINGS: Lower chest: The lung bases are unremarkable in appearance. The heart size is normal.  Upper abdomen: No focal abnormality identified within the liver, spleen, pancreas, or adrenal glands. The gallbladder is surgically absent. No intrarenal or ureteral stones are identified.  Bowel: Within the stomach there is a linear low-density  structure which contains small locules of gas. The findings raise a question of a linear foreign body. Structure measures approximately 12 cm in length, 0.3 cm in diameter. The small bowel loops are normal in appearance. The appendix is not well seen. There is no evidence for acute appendicitis. Colonic loops are nondilated. There is minimal deposition of fat within the colonic wall, a finding which can be associated with chronic colitis.  Pelvis: The urinary bladder, of seminal vesicles have a normal appearance. There is prostatic enlargement.  Retroperitoneum: There is atherosclerotic calcification of the  abdominal aorta. No aneurysm. Small porta hepatis nodes are noted, nonspecific in appearance.  Abdominal wall: There is a midline fat containing hernia, unchanged in appearance. No evidence for associated bowel obstruction.  Osseous structures: Unremarkable.  IMPRESSION: 1. Suspect a linear foreign body within the stomach, 12 cm in length. 2. No evidence for bowel obstruction.  Prostatic enlargement. 3. Fat deposition within the distal colon, raising the question of chronic colitis. 4. The findings were discussed with Dr. Claudine Mouton On 12/31/2013 at 1:58 am.   Electronically Signed   By: Shon Hale M.D.   On: 12/31/2013 01:59   Dg Abd Acute W/chest  12/30/2013   CLINICAL DATA:  Vomiting.  History of smoking.  EXAM: ACUTE ABDOMEN SERIES (ABDOMEN 2 VIEW & CHEST 1 VIEW)  COMPARISON:  Chest and abdominal radiographs performed 10/04/2013  FINDINGS: The lungs are well-aerated and clear. There is no evidence of focal opacification, pleural effusion or pneumothorax. The cardiomediastinal silhouette is within normal limits.  The visualized bowel gas pattern is unremarkable. Scattered stool and air are seen within the colon; there is no evidence of small bowel dilatation to suggest obstruction. No free intra-abdominal air is identified on the provided upright view. Clips are noted within the right upper quadrant, reflecting prior cholecystectomy.  No acute osseous abnormalities are seen; the sacroiliac joints are unremarkable in appearance.  IMPRESSION: 1. Unremarkable bowel gas pattern; no free intra-abdominal air seen. 2. No acute cardiopulmonary process identified.   Electronically Signed   By: Garald Balding M.D.   On: 12/30/2013 22:21    Scheduled Meds: . amLODipine  10 mg Oral Daily  . benztropine  0.5 mg Oral BID  . fluPHENAZine  10 mg Oral QHS  . heparin  5,000 Units Subcutaneous 3 times per day  . pantoprazole (PROTONIX) IV  40 mg Intravenous Q24H   Continuous Infusions: . sodium chloride    . 0.9 % NaCl with  KCl 20 mEq / L      Principal Problem:   Foreign body in stomach Active Problems:   SCHIZOPHRENIA NEC, CHRONIC   HYPERTENSION, ESSENTIAL NOS   GERD   Weight loss   Nausea with vomiting   Reflux esophagitis    Time spent: 25 min    [facility] Pager [phone]. If 7PM-7AM, please contact night-coverage at [email or web address], password [facility] 12/31/2013, 4:25 PM  LOS: 1 day

## 2013-12-31 NOTE — Progress Notes (Signed)
Utilization review completed.  

## 2013-12-31 NOTE — Anesthesia Postprocedure Evaluation (Signed)
Anesthesia Post Note  Patient: [name]  Procedure(s) Performed: Procedure(s) (LRB): ESOPHAGOGASTRODUODENOSCOPY (EGD) (N/A)  Anesthesia type: General  Patient location: PACU  Post pain: Pain level controlled  Post assessment: Post-op Vital signs reviewed  Last Vitals: BP 115/74  Pulse 74  Temp(Src) 37.2 C (Oral)  Resp 18  Ht 5\' 9"  (1.753 m)  Wt 229 lb (103.874 kg)  BMI 33.80 kg/m2  SpO2 98%  Post vital signs: Reviewed  Level of consciousness: sedated  Complications: No apparent anesthesia complications

## 2013-12-31 NOTE — Consult Note (Signed)
Consultation  Referring Provider:     Hospitalist Primary Care Physician:  Walker Kehr, MD Primary Gastroenterologist:      Olevia Perches   Reason for Consultation:     Vomiting, foreign body in stomach on CT     Impression / Plan:   Vomiting, foreign body in stomach on CT - not certain vomiting is from the foreign body - vomiting has stopped  EGD to remove foreign body in stomach - ? Syringe. The risks and benefits as well as alternatives of endoscopic procedure(s) have been discussed and reviewed. All questions answered. The patient agrees to proceed.         HPI:   [name] is a 58 y.o. male with  A several day history of nausea and vomiting with some epigastric pain. AXR images unrevealing. Then CT showed linear foreign body in stomach. He denies ingestion of anything knowingly. He says every Spring and Fall he gest nausea and vomiting and is admitted to hospital for a few days.    Past Medical History  Diagnosis Date  . Hyperlipidemia   . Hypertension   . Depression   . Allergy   . Anxiety   . ALLERGIC RHINITIS 01/25/2007  . ANXIETY DISORDER, GENERALIZED 01/25/2007  . DEPRESSION 01/25/2007  . ESOPHAGITIS 12/28/2007  . GERD 01/25/2007  . GLUCOSE INTOLERANCE, HX OF 01/25/2007  . HEMORRHOIDS, INTERNAL, WITH BLEEDING 04/26/2008  . HYPERLIPIDEMIA 12/27/2007  . HYPERTENSION, ESSENTIAL NOS 01/25/2007  . Hyperthyroidism 08/11/2010  . HYPERTROPHY PROSTATE W/UR OBST & OTH LUTS 02/27/2010  . 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 LOW BACK PAIN, CHRONIC 11/08/2007  . 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 SCOLIOSIS NEC 01/25/2007  . Sebaceous cyst 08/10/2010  . SMOKER 08/05/2009  . UPPER GASTROINTESTINAL HEMORRHAGE 01/25/2007  . Rectal abscess   . Pancreatitis   . Fatty liver 10/09/2010  . Hypokalemia   . Hiatal hernia   . Heart murmur     hx of   . Arthritis     Past Surgical History  Procedure Laterality Date  . Excision of thyroid mass    . Abdominal exploration  surgery  1988  . Hernia repair      ventral  . Cholecystectomy  05/20/08    Dr. Zella Richer  . Excision of scalp lesion  07/2009  . Shoulder surgery      right  . Ganglion cyst excision      right foot x 2  . Incisional hernia repair  12/01/2011    Procedure: LAPAROSCOPIC INCISIONAL HERNIA;  Surgeon: Harl Bowie, MD;  Location: [facility] OR;  Service: General;  Laterality: N/A;    Family History  Problem Relation Age of Onset  . Hypertension Father   . Hypertension Mother   . Prostate cancer Other   . Colon cancer Neg Hx     History  Substance Use Topics  . Smoking status: Current Every Day Smoker -- 1.50 packs/day for 30 years    Types: Cigarettes  . Smokeless tobacco: Not on file  . Alcohol Use: No    Prior to Admission medications   Medication Sig Start Date End Date Taking? Authorizing Provider  alum & mag hydroxide-simeth (MAALOX/MYLANTA) 200-200-20 MG/5ML suspension Take 30 mLs by mouth every 6 (six) hours as needed for indigestion or heartburn (for upset stomach).   Yes Historical Provider, MD  amLODipine (NORVASC) 10 MG tablet Take 1 tablet (10 mg total) by mouth daily. 11/22/13  Yes Cassandria Anger, MD  benztropine (  COGENTIN) 0.5 MG tablet Take 0.5 mg by mouth 2 (two) times daily.  12/19/13  Yes Historical Provider, MD  calcium carbonate (TUMS - DOSED IN MG ELEMENTAL CALCIUM) 500 MG chewable tablet Chew 2 tablets by mouth daily as needed for indigestion or heartburn (for upset stomach).   Yes Historical Provider, MD  esomeprazole (NEXIUM) 20 MG capsule Take 20 mg by mouth daily at 12 noon.   Yes Historical Provider, MD  fluPHENAZine (PROLIXIN) 10 MG tablet Take 10 mg by mouth at bedtime.   Yes Historical Provider, MD  naproxen sodium (ANAPROX) 220 MG tablet Take 220 mg by mouth 2 (two) times daily as needed (pain).   Yes Historical Provider, MD  potassium chloride SA (K-DUR,KLOR-CON) 20 MEQ tablet Take 1 tablet (20 mEq total) by mouth daily. 10/04/13  Yes Wandra Arthurs, MD    promethazine (PHENERGAN) 25 MG tablet Take 1 tablet (25 mg total) by mouth every 6 (six) hours as needed for nausea or vomiting. 12/30/13   Dorie Rank, MD    Current Facility-Administered Medications  Medication Dose Route Frequency Provider Last Rate Last Dose  . 0.9 %  sodium chloride infusion  1,000 mL Intravenous Continuous Dorie Rank, MD   1,000 mL at 12/31/13 0008  . 0.9 % NaCl with KCl 20 mEq/ L  infusion   Intravenous Continuous Berle Mull, MD      . amLODipine (NORVASC) tablet 10 mg  10 mg Oral Daily Berle Mull, MD   10 mg at 12/31/13 0903  . benztropine (COGENTIN) tablet 0.5 mg  0.5 mg Oral BID Berle Mull, MD   0.5 mg at 12/31/13 0903  . fluPHENAZine (PROLIXIN) tablet 10 mg  10 mg Oral QHS Berle Mull, MD      . heparin injection 5,000 Units  5,000 Units Subcutaneous 3 times per day Berle Mull, MD      . ondansetron [facility]) tablet 4 mg  4 mg Oral Q6H PRN Berle Mull, MD       Or  . ondansetron (ZOFRAN) 8 mg/NS 50 ml IVPB  8 mg Intravenous Q6H PRN Berle Mull, MD      . oxyCODONE (Oxy IR/ROXICODONE) immediate release tablet 5 mg  5 mg Oral Q4H PRN Berle Mull, MD      . pantoprazole (PROTONIX) injection 40 mg  40 mg Intravenous Q24H Berle Mull, MD   40 mg at 12/31/13 0904    Allergies as of 12/30/2013 - Review Complete 12/30/2013  Allergen Reaction Noted  . Celecoxib  01/24/2007  . Iohexol  06/25/2008  . Metoclopramide hcl  01/24/2007     Review of Systems:    This is positive for those things mentioned in the HPI, also positive for schizophrenia that is controlled All other review of systems are negative.    Physical Exam:  Vital signs in last 24 hours: Temp:  [98.4 F (36.9 C)-99.3 F (37.4 C)] 99.3 F (37.4 C) (09/14 0615) Pulse Rate:  [92-111] 92 (09/14 0615) Resp:  [17-20] 18 (09/14 0615) BP: (131-137)/(76-82) 131/78 mmHg (09/14 0615) SpO2:  [97 %-100 %] 97 % (09/14 0615) Weight:  [229 lb (103.874 kg)] 229 lb (103.874 kg) (09/13 2002) Last BM Date:  12/29/13  General:  Well-developed, well-nourished and in no acute distress Eyes:  anicteric. ENT:   Mouth and posterior pharynx free of lesions.  Neck:   supple w/o thyromegaly or mass.  Lungs: Clear to auscultation bilaterally. Heart:  S1S2, no rubs, murmurs, gallops. Abdomen:  soft, non-tender, no hepatosplenomegaly. +  small upper ventral hernia hernia, or mass and BS+.  Lymph:  no cervical or supraclavicular adenopathy. Extremities:   no edema Skin   no rash. Neuro:  A&O x 3.  Psych:  appropriate mood and  Affect.   Data Reviewed:   LAB RESULTS:  Recent Labs  12/30/13 2043 12/31/13 0206 12/31/13 0815  WBC 13.7*  --  14.8*  HGB 16.4 15.6 13.7  HCT 45.8 46.0 38.9*  PLT 329  --  292   BMET  Recent Labs  12/30/13 2043 12/31/13 0206 12/31/13 0815  NA 141 139 139  K 3.0* 3.0* 3.3*  CL 97 101 100  CO2 23  --  24  GLUCOSE 164* 130* 112*  BUN 7 5* 8  CREATININE 1.26 1.20 1.02  CALCIUM 10.3  --  9.0   LFT  Recent Labs  12/31/13 0815  PROT 7.0  ALBUMIN 3.6  AST 11  ALT 9  ALKPHOS 69  BILITOT 0.5   PT/INR  Recent Labs  12/31/13 0815  LABPROT 13.7  INR 1.05    STUDIES: Ct Abdomen Pelvis Wo Contrast  12/31/2013   CLINICAL DATA:  Nausea, vomiting. Upper abdominal pain radiates to lower abdomen. White cell count 13.7. Prior cholecystectomy. History of to ventral hernia repairs. Exploratory abdominal surgery. History of hypertension and GERD, hiatal hernia, fatty liver, pancreatitis, upper GI bleed, internal hemorrhoids.  EXAM: CT ABDOMEN AND PELVIS WITHOUT CONTRAST  TECHNIQUE: Multidetector CT imaging of the abdomen and pelvis was performed following the standard protocol without IV contrast.  COMPARISON:  CT of the abdomen pelvis on 07/22/2013  FINDINGS: Lower chest: The lung bases are unremarkable in appearance. The heart size is normal.  Upper abdomen: No focal abnormality identified within the liver, spleen, pancreas, or adrenal glands. The gallbladder is  surgically absent. No intrarenal or ureteral stones are identified.  Bowel: Within the stomach there is a linear low-density structure which contains small locules of gas. The findings raise a question of a linear foreign body. Structure measures approximately 12 cm in length, 0.3 cm in diameter. The small bowel loops are normal in appearance. The appendix is not well seen. There is no evidence for acute appendicitis. Colonic loops are nondilated. There is minimal deposition of fat within the colonic wall, a finding which can be associated with chronic colitis.  Pelvis: The urinary bladder, of seminal vesicles have a normal appearance. There is prostatic enlargement.  Retroperitoneum: There is atherosclerotic calcification of the abdominal aorta. No aneurysm. Small porta hepatis nodes are noted, nonspecific in appearance.  Abdominal wall: There is a midline fat containing hernia, unchanged in appearance. No evidence for associated bowel obstruction.  Osseous structures: Unremarkable.  IMPRESSION: 1. Suspect a linear foreign body within the stomach, 12 cm in length. 2. No evidence for bowel obstruction.  Prostatic enlargement. 3. Fat deposition within the distal colon, raising the question of chronic colitis. 4. The findings were discussed with Dr. Claudine Mouton On 12/31/2013 at 1:58 am.   Electronically Signed   By: Shon Hale M.D.   On: 12/31/2013 01:59   Dg Abd Acute W/chest  12/30/2013   CLINICAL DATA:  Vomiting.  History of smoking.  EXAM: ACUTE ABDOMEN SERIES (ABDOMEN 2 VIEW & CHEST 1 VIEW)  COMPARISON:  Chest and abdominal radiographs performed 10/04/2013  FINDINGS: The lungs are well-aerated and clear. There is no evidence of focal opacification, pleural effusion or pneumothorax. The cardiomediastinal silhouette is within normal limits.  The visualized bowel gas pattern is unremarkable. Scattered stool  and air are seen within the colon; there is no evidence of small bowel dilatation to suggest obstruction. No free  intra-abdominal air is identified on the provided upright view. Clips are noted within the right upper quadrant, reflecting prior cholecystectomy.  No acute osseous abnormalities are seen; the sacroiliac joints are unremarkable in appearance.  IMPRESSION: 1. Unremarkable bowel gas pattern; no free intra-abdominal air seen. 2. No acute cardiopulmonary process identified.   Electronically Signed   By: Garald Balding M.D.   On: 12/30/2013 22:21   Images viewed  PREVIOUS ENDOSCOPIES:           Colonoscopy 2012   ENDOSCOPIC IMPRESSION:  1) Internal hemorrhoids  2) Otherwise nl colonoscopy [facility]  RECOMMENDATIONS:  Anusol HC supp  bleeding likely from the hemorrhoids, consider hem band ligation   EGD 2012   ENDOSCOPIC IMPRESSION:  1) Hiatal hernia  2) Otherwise normal examination  RECOMMENDATIONS:  1) Anti-reflux regimen to be follow  continue Nexiem 40 mg daily    Thanks   LOS: 1 day   @Aundra Pung  Simonne Maffucci, MD, [facility] @  12/31/2013, 12:22 PM

## 2013-12-31 NOTE — Transfer of Care (Signed)
Immediate Anesthesia Transfer of Care Note  Patient: [name]  Procedure(s) Performed: Procedure(s): ESOPHAGOGASTRODUODENOSCOPY (EGD) (N/A)  Patient Location: PACU and Endoscopy Unit  Anesthesia Type:General  Level of Consciousness: awake, sedated and responds to stimulation  Airway & Oxygen Therapy: Patient Spontanous Breathing and Patient connected to face mask oxygen  Post-op Assessment: Report given to PACU RN and Post -op Vital signs reviewed and stable  Post vital signs: Reviewed and stable  Complications: No apparent anesthesia complications

## 2013-12-31 NOTE — Anesthesia Preprocedure Evaluation (Addendum)
Anesthesia Evaluation  Patient identified by MRN, date of birth, ID band Patient awake    Reviewed: Allergy & Precautions, H&P , NPO status , Patient's Chart, lab work & pertinent test results, reviewed documented beta blocker date and time   Airway Mallampati: II TM Distance: >3 FB Neck ROM: Full    Dental  (+) Teeth Intact, Poor Dentition, Dental Advisory Given   Pulmonary Current Smoker,    Pulmonary exam normal       Cardiovascular hypertension, Pt. on medications + Valvular Problems/Murmurs     Neuro/Psych PSYCHIATRIC DISORDERS Anxiety Depression Schizophrenia  Neuromuscular disease    GI/Hepatic Neg liver ROS, hiatal hernia, GERD-  Medicated and Controlled,  Endo/Other  Hyperthyroidism   Renal/GU negative Renal ROS     Musculoskeletal  (+) Arthritis -,   Abdominal (+) + obese,   Peds  Hematology   Anesthesia Other Findings   Reproductive/Obstetrics                          Anesthesia Physical  Anesthesia Plan  ASA: III  Anesthesia Plan: General   Post-op Pain Management:    Induction: Intravenous  Airway Management Planned: Oral ETT  Additional Equipment:   Intra-op Plan:   Post-operative Plan: Extubation in OR  Informed Consent: I have reviewed the patients History and Physical, chart, labs and discussed the procedure including the risks, benefits and alternatives for the proposed anesthesia with the patient or authorized representative who has indicated his/her understanding and acceptance.   Dental advisory given  Plan Discussed with: CRNA  Anesthesia Plan Comments:        Anesthesia Quick Evaluation

## 2014-01-01 ENCOUNTER — Encounter (HOSPITAL_COMMUNITY): Payer: Self-pay | Admitting: Internal Medicine

## 2014-01-01 DIAGNOSIS — K2100 Gastro-esophageal reflux disease with esophagitis, without bleeding: Principal | ICD-10-CM

## 2014-01-01 LAB — BASIC METABOLIC PANEL
ANION GAP: 10 (ref 5–15)
BUN: 9 mg/dL (ref 6–23)
CO2: 27 mEq/L (ref 19–32)
Calcium: 8.7 mg/dL (ref 8.4–10.5)
Chloride: 101 mEq/L (ref 96–112)
Creatinine, Ser: 1.19 mg/dL (ref 0.50–1.35)
GFR calc non Af Amer: 67 mL/min — ABNORMAL LOW (ref >90)
GFR, EST AFRICAN AMERICAN: 77 mL/min — AB (ref >90)
Glucose, Bld: 93 mg/dL (ref 70–99)
POTASSIUM: 3.4 meq/L — AB (ref 3.7–5.3)
Sodium: 138 mEq/L (ref 137–147)

## 2014-01-01 MED ORDER — PANTOPRAZOLE SODIUM 40 MG PO TBEC: 40.0000 mg | DELAYED_RELEASE_TABLET | Freq: Two times a day (BID) | ORAL | Status: DC

## 2014-01-01 MED ORDER — PANTOPRAZOLE SODIUM 40 MG PO TBEC
40.0000 mg | DELAYED_RELEASE_TABLET | Freq: Two times a day (BID) | ORAL | Status: DC
  Administered 2014-01-01: 40 mg via ORAL
  Filled 2014-01-01: qty 1

## 2014-01-01 NOTE — Progress Notes (Signed)
Patient given d/c instructions. Verbalized understanding. Wife at bedside. All questions addressed. No other concerns voiced. Pain managed at d/c.

## 2014-01-01 NOTE — Progress Notes (Signed)
     [facility] Progress Note  Subjective:   Feels much better. No nausea or vomiting. Wants to go home. Tol reg diet for breakfast.     Objective:  Vital signs in last 24 hours: Temp:  [98 F (36.7 C)-99 F (37.2 C)] 98.3 F (36.8 C) (09/15 0504) Pulse Rate:  [74-85] 79 (09/15 0504) Resp:  [14-20] 16 (09/15 0504) BP: (110-141)/(50-89) 118/62 mmHg (09/15 0504) SpO2:  [95 %-100 %] 95 % (09/15 0504) Weight:  [229 lb (103.874 kg)] 229 lb (103.874 kg) (09/15 0504) Last BM Date: 12/29/13 General:   Alert,  Well-developed,mal  in NAD Heart:  Regular rate and rhythm; no murmurs Pulm lungs clear Abdomen:  Soft, nontender and nondistended. Normal bowel sounds, without guarding, and without rebound.   Extremities:  Without edema. Neurologic:  Alert and  oriented x4;  grossly normal neurologically. Psych:  Alert and cooperative. Normal mood and affect.  Intake/Output from previous day: 09/14 0701 - 09/15 0700 In: 2300 [P.O.:900; I.V.:1200; IV Piggyback:200] Out: 925 [Urine:925] Intake/Output this shift: Total I/O In: 240 [P.O.:240] Out: -   Lab Results:  Recent Labs  12/30/13 2043 12/31/13 0206 12/31/13 0815  WBC 13.7*  --  14.8*  HGB 16.4 15.6 13.7  HCT 45.8 46.0 38.9*  PLT 329  --  292   BMET  Recent Labs  12/30/13 2043 12/31/13 0206 12/31/13 0815 01/01/14 0500  NA 141 139 139 138  K 3.0* 3.0* 3.3* 3.4*  CL 97 101 100 101  CO2 23  --  24 27  GLUCOSE 164* 130* 112* 93  BUN 7 5* 8 9  CREATININE 1.26 1.20 1.02 1.19  CALCIUM 10.3  --  9.0 8.7   LFT  PT/INR  PROCEDURES: EGD 12/31/13: ENDOSCOPIC IMPRESSION: 1. Esophagitis consistent with reflux esophagitis at the gastroesophageal junction 2. 4 cm hiatal hernia - otherwise normal and no foreign bodies - suspect CT artifact and not real  Assessment / Plan: 58 yo male admitted with nausea and vomiting, found to have reflux esophagitis.When pt discharged, he should be on bid PPI. He is to f/u in  GI office in 3-4 weeks ( has appt 9/30). Will need repeat EGD in 3 months to document healing of reflux esophagitis.     LOS: 2 days   Hvozdovic, Deloris Ping 01/01/2014, Pager [phone]  Agree w/ Ms. Hvozdovic's note and mangement.

## 2014-01-01 NOTE — Anesthesia Postprocedure Evaluation (Signed)
Anesthesia Post Note  Patient: [name]  Procedure(s) Performed: Procedure(s) (LRB): ESOPHAGOGASTRODUODENOSCOPY (EGD) (N/A)  Anesthesia type: MAC  Patient location: PACU  Post pain: Pain level controlled  Post assessment: Post-op Vital signs reviewed  Last Vitals: BP 118/62  Pulse 79  Temp(Src) 36.8 C (Oral)  Resp 16  Ht 5\' 9"  (1.753 m)  Wt 229 lb (103.874 kg)  BMI 33.80 kg/m2  SpO2 95%  Post vital signs: Reviewed  Level of consciousness: awake  Complications: No apparent anesthesia complications

## 2014-01-01 NOTE — Discharge Summary (Signed)
Physician Discharge Summary  [name] VOJ:[id number] DOB: 05-02-1955 DOA: 12/30/2013  PCP: Walker Kehr, MD  Admit date: 12/30/2013 Discharge date: 01/01/2014  Time spent: 50* minutes  Recommendations for Outpatient Follow-up:  1. *Follow up PCP in 2 weeks 2. Follow up GI in one month  Discharge Diagnoses:  Principal Problem:   Foreign body in stomach Active Problems:   SCHIZOPHRENIA NEC, CHRONIC   HYPERTENSION, ESSENTIAL NOS   GERD   Weight loss   Nausea with vomiting   Reflux esophagitis   Discharge Condition: Stable  Diet recommendation: *Low salt diet  Filed Weights   12/30/13 2002 01/01/14 0504  Weight: 103.874 kg (229 lb) 103.874 kg (229 lb)    History of present illness:  58 y.o. male with Past medical history of hypertension, schizophrenia, hiatal hernia, GERD.  The patient is presenting with complaints of abdominal pain nausea and vomiting.  He mentions he has nausea and vomiting for last 3 years occurring on and off. This episode has started 1 week ago and has worsened since Sunday. He complains of pain located in his abdomen in the upper area radiating to lower on the left side. He also complains of acid reflux. He complains of nausea at the time of my evaluation in mentions vomited roughly 20 times. Denies any blood in his vomitus in  Last bowel movement was 2 days ago.  He mentions he is compliant with all his medication he did  He denies swallowing any anything non-edible.  He denies any cough shortness of breath chest pain dizziness lightheadedness or focal deficit.  No recent change in his medication.  The patient is coming from home. And at his baseline independent for most of his ADL   Hospital Course:   ? Foreign body in stomach- Patient had CT abdomen which showed possible syringe in the stomach. He underwent EGD which showed no FB. Only hiatal hernia and esophagitis. Patient started on twice daily Protonix , follow up GI in one  month.  Leukocytosis- ? Reactive, as he is afebrile, no signs and symptoms of infection.  Hypokalemia- Chronic, he takes potassium supplementation at home. Will continue taking the supplements as prescribed.  Procedures:  EGD  Consultations:  GI  Discharge Exam: Filed Vitals:   01/01/14 0504  BP: 118/62  Pulse: 79  Temp: 98.3 F (36.8 C)  Resp: 16    General: Appears in no acute distress Cardiovascular: S1s2 RRR Respiratory: Clear bilaterally  Discharge Instructions You were cared for by a hospitalist during your hospital stay. If you have any questions about your discharge medications or the care you received while you were in the hospital after you are discharged, you can call the unit and asked to speak with the hospitalist on call if the hospitalist that took care of you is not available. Once you are discharged, your primary care physician will handle any further medical issues. Please note that NO REFILLS for any discharge medications will be authorized once you are discharged, as it is imperative that you return to your primary care physician (or establish a relationship with a primary care physician if you do not have one) for your aftercare needs so that they can reassess your need for medications and monitor your lab values.  Discharge Instructions   Diet - low sodium heart healthy    Complete by:  As directed      Increase activity slowly    Complete by:  As directed  Current Discharge Medication List    START taking these medications   Details  pantoprazole (PROTONIX) 40 MG tablet Take 1 tablet (40 mg total) by mouth 2 (two) times daily. Qty: 60 tablet, Refills: 2      CONTINUE these medications which have CHANGED   Details  promethazine (PHENERGAN) 25 MG tablet Take 1 tablet (25 mg total) by mouth every 6 (six) hours as needed for nausea or vomiting. Qty: 15 tablet, Refills: 0      CONTINUE these medications which have NOT CHANGED   Details   alum & mag hydroxide-simeth (MAALOX/MYLANTA) 200-200-20 MG/5ML suspension Take 30 mLs by mouth every 6 (six) hours as needed for indigestion or heartburn (for upset stomach).    amLODipine (NORVASC) 10 MG tablet Take 1 tablet (10 mg total) by mouth daily. Qty: 90 tablet, Refills: 3    benztropine (COGENTIN) 0.5 MG tablet Take 0.5 mg by mouth 2 (two) times daily.     calcium carbonate (TUMS - DOSED IN MG ELEMENTAL CALCIUM) 500 MG chewable tablet Chew 2 tablets by mouth daily as needed for indigestion or heartburn (for upset stomach).    fluPHENAZine (PROLIXIN) 10 MG tablet Take 10 mg by mouth at bedtime.    potassium chloride SA (K-DUR,KLOR-CON) 20 MEQ tablet Take 1 tablet (20 mEq total) by mouth daily. Qty: 5 tablet, Refills: 0      STOP taking these medications     esomeprazole (NEXIUM) 20 MG capsule      naproxen sodium (ANAPROX) 220 MG tablet        Allergies  Allergen Reactions  . Celecoxib     REACTION: rectal bleeding  . Iohexol     Affects nerves: triggers schizophrenia  . Metoclopramide Hcl     Affects nerves: triggers schizophrenia   Follow-up Information   Follow up with Walker Kehr, MD.   Specialty:  Internal Medicine   Contact information:   [place] [address] [phone]       Follow up with Tye Savoy, NP On 01/16/2014. (9:00-- be there 15 minutes early--bring all meds)    Specialty:  Nurse Practitioner   Contact information:   [address] [place] [address] [phone]       Follow up with Walker Kehr, MD In 2 weeks. (Check CBC in 2 weeks)    Specialty:  Internal Medicine   Contact information:   [place] [address] [phone]        The results of significant diagnostics from this hospitalization (including imaging, microbiology, ancillary and laboratory) are listed below for reference.    Significant Diagnostic Studies: Ct Abdomen Pelvis Wo Contrast  12/31/2013   CLINICAL DATA:   Nausea, vomiting. Upper abdominal pain radiates to lower abdomen. White cell count 13.7. Prior cholecystectomy. History of to ventral hernia repairs. Exploratory abdominal surgery. History of hypertension and GERD, hiatal hernia, fatty liver, pancreatitis, upper GI bleed, internal hemorrhoids.  EXAM: CT ABDOMEN AND PELVIS WITHOUT CONTRAST  TECHNIQUE: Multidetector CT imaging of the abdomen and pelvis was performed following the standard protocol without IV contrast.  COMPARISON:  CT of the abdomen pelvis on 07/22/2013  FINDINGS: Lower chest: The lung bases are unremarkable in appearance. The heart size is normal.  Upper abdomen: No focal abnormality identified within the liver, spleen, pancreas, or adrenal glands. The gallbladder is surgically absent. No intrarenal or ureteral stones are identified.  Bowel: Within the stomach there is a linear low-density structure which contains small  locules of gas. The findings raise a question of a linear foreign body. Structure measures approximately 12 cm in length, 0.3 cm in diameter. The small bowel loops are normal in appearance. The appendix is not well seen. There is no evidence for acute appendicitis. Colonic loops are nondilated. There is minimal deposition of fat within the colonic wall, a finding which can be associated with chronic colitis.  Pelvis: The urinary bladder, of seminal vesicles have a normal appearance. There is prostatic enlargement.  Retroperitoneum: There is atherosclerotic calcification of the abdominal aorta. No aneurysm. Small porta hepatis nodes are noted, nonspecific in appearance.  Abdominal wall: There is a midline fat containing hernia, unchanged in appearance. No evidence for associated bowel obstruction.  Osseous structures: Unremarkable.  IMPRESSION: 1. Suspect a linear foreign body within the stomach, 12 cm in length. 2. No evidence for bowel obstruction.  Prostatic enlargement. 3. Fat deposition within the distal colon, raising the  question of chronic colitis. 4. The findings were discussed with Dr. Claudine Mouton On 12/31/2013 at 1:58 am.   Electronically Signed   By: Shon Hale M.D.   On: 12/31/2013 01:59   Dg Abd Acute W/chest  12/30/2013   CLINICAL DATA:  Vomiting.  History of smoking.  EXAM: ACUTE ABDOMEN SERIES (ABDOMEN 2 VIEW & CHEST 1 VIEW)  COMPARISON:  Chest and abdominal radiographs performed 10/04/2013  FINDINGS: The lungs are well-aerated and clear. There is no evidence of focal opacification, pleural effusion or pneumothorax. The cardiomediastinal silhouette is within normal limits.  The visualized bowel gas pattern is unremarkable. Scattered stool and air are seen within the colon; there is no evidence of small bowel dilatation to suggest obstruction. No free intra-abdominal air is identified on the provided upright view. Clips are noted within the right upper quadrant, reflecting prior cholecystectomy.  No acute osseous abnormalities are seen; the sacroiliac joints are unremarkable in appearance.  IMPRESSION: 1. Unremarkable bowel gas pattern; no free intra-abdominal air seen. 2. No acute cardiopulmonary process identified.   Electronically Signed   By: Garald Balding M.D.   On: 12/30/2013 22:21    Microbiology: No results found for this or any previous visit (from the past 240 hour(s)).   Labs: Basic Metabolic Panel:  Recent Labs Lab 12/30/13 2043 12/31/13 0206 12/31/13 0815 01/01/14 0500  NA 141 139 139 138  K 3.0* 3.0* 3.3* 3.4*  CL 97 101 100 101  CO2 23  --  24 27  GLUCOSE 164* 130* 112* 93  BUN 7 5* 8 9  CREATININE 1.26 1.20 1.02 1.19  CALCIUM 10.3  --  9.0 8.7   Liver Function Tests:  Recent Labs Lab 12/30/13 2043 12/31/13 0815  AST 12 11  ALT <5 9  ALKPHOS 81 69  BILITOT 0.4 0.5  PROT 8.5* 7.0  ALBUMIN 4.4 3.6    Recent Labs Lab 12/30/13 2043  LIPASE 20   No results found for this basename: AMMONIA,  in the last 168 hours CBC:  Recent Labs Lab 12/30/13 2043 12/31/13 0206  12/31/13 0815  WBC 13.7*  --  14.8*  NEUTROABS 11.7*  --  11.1*  HGB 16.4 15.6 13.7  HCT 45.8 46.0 38.9*  MCV 84.0  --  83.5  PLT 329  --  292   Cardiac Enzymes: No results found for this basename: CKTOTAL, CKMB, CKMBINDEX, TROPONINI,  in the last 168 hours BNP: BNP (last 3 results) No results found for this basename: PROBNP,  in the last 8760 hours CBG: No results  found for this basename: GLUCAP,  in the last 168 hours     Signed:  Travone Georg S  [facility] 01/01/2014, 10:35 AM

## 2014-01-08 ENCOUNTER — Encounter: Payer: Self-pay | Admitting: Internal Medicine

## 2014-01-08 ENCOUNTER — Ambulatory Visit (INDEPENDENT_AMBULATORY_CARE_PROVIDER_SITE_OTHER): Payer: Medicare Other | Admitting: Internal Medicine

## 2014-01-08 VITALS — BP 130/80 | HR 98 | Temp 99.4°F | Wt 234.0 lb

## 2014-01-08 DIAGNOSIS — M545 Low back pain, unspecified: Secondary | ICD-10-CM

## 2014-01-08 DIAGNOSIS — Z5189 Encounter for other specified aftercare: Secondary | ICD-10-CM

## 2014-01-08 DIAGNOSIS — F17200 Nicotine dependence, unspecified, uncomplicated: Secondary | ICD-10-CM

## 2014-01-08 DIAGNOSIS — T182XXD Foreign body in stomach, subsequent encounter: Secondary | ICD-10-CM

## 2014-01-08 MED ORDER — NICOTINE 10 MG IN INHA: 1.0000 | RESPIRATORY_TRACT | Status: DC | PRN

## 2014-01-08 MED ORDER — POTASSIUM CHLORIDE CRYS ER 20 MEQ PO TBCR: 20.0000 meq | EXTENDED_RELEASE_TABLET | Freq: Every day | ORAL | Status: DC
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
## 2014-01-08 NOTE — Assessment & Plan Note (Addendum)
Discussed CT, EGD reports. EGD was negative  9/15  CT abd: IMPRESSION:  1. Suspect a linear foreign body within the stomach, 12 cm in  length.  2. No evidence for bowel obstruction. Prostatic enlargement.  3. Fat deposition within the distal colon, raising the question of  chronic colitis.  4. The findings were discussed with Dr. Claudine Mouton On 12/31/2013 at 1:58 am.  Electronically Signed  By: Shon Hale M.D.  On: 12/31/2013 01:59

## 2014-01-08 NOTE — Assessment & Plan Note (Signed)
Better  

## 2014-01-08 NOTE — Assessment & Plan Note (Signed)
Nicotrol inh Rx given Discussed pros and cons

## 2014-01-08 NOTE — Progress Notes (Signed)
Pre visit review using our clinic review tool, if applicable. No additional management support is needed unless otherwise documented below in the visit note. 

## 2014-01-11 ENCOUNTER — Encounter: Payer: Self-pay | Admitting: *Deleted

## 2014-01-16 ENCOUNTER — Encounter: Payer: Self-pay | Admitting: Nurse Practitioner

## 2014-01-16 ENCOUNTER — Ambulatory Visit (INDEPENDENT_AMBULATORY_CARE_PROVIDER_SITE_OTHER): Payer: Medicare Other | Admitting: Nurse Practitioner

## 2014-01-16 VITALS — BP 130/86 | HR 72 | Ht 70.0 in | Wt 232.0 lb

## 2014-01-16 DIAGNOSIS — R112 Nausea with vomiting, unspecified: Secondary | ICD-10-CM

## 2014-01-16 NOTE — Patient Instructions (Signed)
You have been given a separate informational sheet regarding your tobacco use, the importance of quitting and local resources to help you quit.  
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
## 2014-01-17 ENCOUNTER — Encounter: Payer: Self-pay | Admitting: Nurse Practitioner

## 2014-01-17 NOTE — Progress Notes (Signed)
Reviewed. I am glad he is better. Will be happy to see him back prn

## 2014-02-15 ENCOUNTER — Other Ambulatory Visit: Payer: Self-pay | Admitting: Geriatric Medicine

## 2014-02-15 MED ORDER — POTASSIUM CHLORIDE CRYS ER 20 MEQ PO TBCR: 20.0000 meq | EXTENDED_RELEASE_TABLET | Freq: Every day | ORAL | Status: DC

## 2014-02-18 ENCOUNTER — Other Ambulatory Visit: Payer: Self-pay | Admitting: Geriatric Medicine

## 2014-02-18 MED ORDER — POTASSIUM CHLORIDE CRYS ER 20 MEQ PO TBCR: 20.0000 meq | EXTENDED_RELEASE_TABLET | Freq: Every day | ORAL | Status: DC

## 2014-02-27 ENCOUNTER — Telehealth: Payer: Self-pay | Admitting: Internal Medicine

## 2014-02-27 NOTE — Telephone Encounter (Signed)
Patient would like to be referred to a orthopedic, doctor his rt shoulder and neck, is in a lot of pain, can sleep at not. Please advise.

## 2014-02-28 NOTE — Telephone Encounter (Signed)
Needs ov w/any provider first Thx

## 2014-03-01 NOTE — Telephone Encounter (Signed)
OV scheduled.  °

## 2014-03-05 ENCOUNTER — Encounter: Payer: Self-pay | Admitting: Internal Medicine

## 2014-03-05 ENCOUNTER — Other Ambulatory Visit (INDEPENDENT_AMBULATORY_CARE_PROVIDER_SITE_OTHER): Payer: Medicare Other

## 2014-03-05 ENCOUNTER — Ambulatory Visit (INDEPENDENT_AMBULATORY_CARE_PROVIDER_SITE_OTHER): Payer: Medicare Other | Admitting: Internal Medicine

## 2014-03-05 ENCOUNTER — Ambulatory Visit (INDEPENDENT_AMBULATORY_CARE_PROVIDER_SITE_OTHER)
Admission: RE | Admit: 2014-03-05 | Discharge: 2014-03-05 | Disposition: A | Discharge status: Home / Self Care | Payer: Medicare Other | Source: Ambulatory Visit | Attending: Internal Medicine | Admitting: Internal Medicine

## 2014-03-05 VITALS — BP 132/82 | HR 98 | Temp 98.9°F | Wt 238.2 lb

## 2014-03-05 DIAGNOSIS — Z0000 Encounter for general adult medical examination without abnormal findings: Secondary | ICD-10-CM

## 2014-03-05 DIAGNOSIS — L0293 Carbuncle, unspecified: Secondary | ICD-10-CM

## 2014-03-05 DIAGNOSIS — M542 Cervicalgia: Secondary | ICD-10-CM

## 2014-03-05 DIAGNOSIS — M25511 Pain in right shoulder: Secondary | ICD-10-CM

## 2014-03-05 DIAGNOSIS — N320 Bladder-neck obstruction: Secondary | ICD-10-CM

## 2014-03-05 DIAGNOSIS — M25512 Pain in left shoulder: Secondary | ICD-10-CM

## 2014-03-05 DIAGNOSIS — M545 Low back pain, unspecified: Secondary | ICD-10-CM

## 2014-03-05 DIAGNOSIS — E876 Hypokalemia: Secondary | ICD-10-CM

## 2014-03-05 DIAGNOSIS — E785 Hyperlipidemia, unspecified: Secondary | ICD-10-CM

## 2014-03-05 DIAGNOSIS — I10 Essential (primary) hypertension: Secondary | ICD-10-CM

## 2014-03-05 DIAGNOSIS — R739 Hyperglycemia, unspecified: Secondary | ICD-10-CM

## 2014-03-05 DIAGNOSIS — E559 Vitamin D deficiency, unspecified: Secondary | ICD-10-CM

## 2014-03-05 DIAGNOSIS — R202 Paresthesia of skin: Secondary | ICD-10-CM

## 2014-03-05 DIAGNOSIS — F209 Schizophrenia, unspecified: Secondary | ICD-10-CM

## 2014-03-05 LAB — LIPID PANEL
CHOL/HDL RATIO: 6
CHOLESTEROL: 230 mg/dL — AB (ref 0–200)
HDL: 40.7 mg/dL (ref >39.00)
NonHDL: 189.3
Triglycerides: 205 mg/dL — ABNORMAL HIGH (ref 0.0–149.0)
VLDL: 41 mg/dL — ABNORMAL HIGH (ref 0.0–40.0)

## 2014-03-05 LAB — MAGNESIUM: MAGNESIUM: 2.2 mg/dL (ref 1.5–2.5)

## 2014-03-05 LAB — URINALYSIS
BILIRUBIN URINE: NEGATIVE
Hgb urine dipstick: NEGATIVE
Ketones, ur: NEGATIVE
LEUKOCYTES UA: NEGATIVE
NITRITE: NEGATIVE
PH: 5.5 (ref 5.0–8.0)
Specific Gravity, Urine: 1.015 (ref 1.000–1.030)
Total Protein, Urine: NEGATIVE
UROBILINOGEN UA: 0.2 (ref 0.0–1.0)
Urine Glucose: NEGATIVE

## 2014-03-05 LAB — BASIC METABOLIC PANEL
BUN: 7 mg/dL (ref 6–23)
CO2: 23 mEq/L (ref 19–32)
Calcium: 9.6 mg/dL (ref 8.4–10.5)
Chloride: 105 mEq/L (ref 96–112)
Creatinine, Ser: 1.3 mg/dL (ref 0.4–1.5)
GFR: 70.66 mL/min (ref >60.00)
Glucose, Bld: 81 mg/dL (ref 70–99)
Potassium: 4.7 mEq/L (ref 3.5–5.1)
Sodium: 138 mEq/L (ref 135–145)

## 2014-03-05 LAB — HEMOGLOBIN A1C: HEMOGLOBIN A1C: 5.7 % (ref 4.6–6.5)

## 2014-03-05 LAB — CBC WITH DIFFERENTIAL/PLATELET
Basophils Absolute: 0 10*3/uL (ref 0.0–0.1)
Basophils Relative: 0.5 % (ref 0.0–3.0)
Eosinophils Absolute: 0.1 10*3/uL (ref 0.0–0.7)
Eosinophils Relative: 1.4 % (ref 0.0–5.0)
HEMATOCRIT: 45.2 % (ref 39.0–52.0)
HEMOGLOBIN: 15.1 g/dL (ref 13.0–17.0)
LYMPHS ABS: 2.5 10*3/uL (ref 0.7–4.0)
Lymphocytes Relative: 29.6 % (ref 12.0–46.0)
MCHC: 33.3 g/dL (ref 30.0–36.0)
MCV: 87.5 fl (ref 78.0–100.0)
Monocytes Absolute: 0.7 10*3/uL (ref 0.1–1.0)
Monocytes Relative: 8.5 % (ref 3.0–12.0)
Neutro Abs: 5 10*3/uL (ref 1.4–7.7)
Neutrophils Relative %: 60 % (ref 43.0–77.0)
Platelets: 350 10*3/uL (ref 150.0–400.0)
RBC: 5.17 Mil/uL (ref 4.22–5.81)
RDW: 15.2 % (ref 11.5–15.5)
WBC: 8.4 10*3/uL (ref 4.0–10.5)

## 2014-03-05 LAB — HEPATIC FUNCTION PANEL
ALK PHOS: 76 U/L (ref 39–117)
ALT: 16 U/L (ref 0–53)
AST: 14 U/L (ref 0–37)
Albumin: 4.3 g/dL (ref 3.5–5.2)
BILIRUBIN TOTAL: 0.4 mg/dL (ref 0.2–1.2)
Bilirubin, Direct: 0.1 mg/dL (ref 0.0–0.3)
Total Protein: 7.4 g/dL (ref 6.0–8.3)

## 2014-03-05 LAB — PSA: PSA: 5.47 ng/mL — AB (ref 0.10–4.00)

## 2014-03-05 LAB — VITAMIN B12: Vitamin B-12: 219 pg/mL (ref 211–911)

## 2014-03-05 LAB — LDL CHOLESTEROL, DIRECT: Direct LDL: 168.4 mg/dL

## 2014-03-05 LAB — TSH: TSH: 1.4 u[IU]/mL (ref 0.35–4.50)

## 2014-03-05 MED ORDER — METHYLPREDNISOLONE ACETATE 80 MG/ML IJ SUSP
80.0000 mg | Freq: Once | INTRAMUSCULAR | Status: AC
  Administered 2014-03-05: 80 mg via INTRAMUSCULAR

## 2014-03-05 MED ORDER — MELOXICAM 15 MG PO TABS: 15.0000 mg | ORAL_TABLET | Freq: Every day | ORAL | Status: DC

## 2014-03-05 MED ORDER — DOXYCYCLINE HYCLATE 100 MG PO TABS: 100.0000 mg | ORAL_TABLET | Freq: Two times a day (BID) | ORAL | Status: DC

## 2014-03-05 MED ORDER — PREDNISONE 10 MG PO TABS: ORAL_TABLET | ORAL | Status: DC

## 2014-03-05 NOTE — Assessment & Plan Note (Signed)
Also for doxy course for 2 small abscess at scrotum

## 2014-03-05 NOTE — Assessment & Plan Note (Addendum)
Suspect multifactoria, has known DJD right shoulder, also possible bilat impingement like symptoms left > right, for films, refer Sport med/dr Tamala Julian, nsaid prn  Note:  Total time for pt hx, exam, review of record with pt in the room, determination of diagnoses and plan for further eval and tx is > 40 min, with over 50% spent in coordination and counseling of patient, difficult exam due to psychiatric impairment

## 2014-03-05 NOTE — Assessment & Plan Note (Signed)
stable overall by history and exam, recent data reviewed with pt, and pt to continue medical treatment as before,  to f/u any worsening symptoms or concerns BP Readings from Last 3 Encounters:  03/05/14 132/82  01/16/14 130/86  01/08/14 130/80

## 2014-03-05 NOTE — Progress Notes (Signed)
Subjective:    Patient ID: [name], male    DOB: Sep 08, 1955, 58 y.o.   MRN: [id number]  HPI  Here with 1-2 mo worsening sharp mild to mod pain to bilateral shoulders right > left with pain on ROM and abuction, also more recently with right neck burning type pain radiating to the right upper back, shoulder and arm to just below the elbow most times, sometimes to the wrist, nothing seems to make better or worse, such as neck extension.  No recent trauma, heavy lifting, falls or other trauma. Also with several small boils recurrent to scrotum as well for several days, one drained this am, has been recurrent ongoing for many years.  No fever, and Denies urinary symptoms such as dysuria, frequency, urgency, flank pain, hematuria or n/v, fever, chills.  Has not yet had labs ordered per Dr Alain Marion at last visit  Still sees Dr Candis Schatz locally for psychatiry/schizophrenia.  Son recently diagnosed/tx for bipolar Past Medical History  Diagnosis Date  . ALLERGIC RHINITIS 01/25/2007  . ANXIETY DISORDER, GENERALIZED 01/25/2007  . DEPRESSION 01/25/2007  . ESOPHAGITIS 12/28/2007  . GERD 01/25/2007  . GLUCOSE INTOLERANCE, HX OF 01/25/2007  . HEMORRHOIDS, INTERNAL, WITH BLEEDING 04/26/2008  . HYPERLIPIDEMIA 12/27/2007  . HYPERTENSION, ESSENTIAL NOS 01/25/2007  . Hyperthyroidism 08/11/2010  . HYPERTROPHY PROSTATE W/UR OBST & OTH LUTS 02/27/2010  . 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 LOW BACK PAIN, CHRONIC 11/08/2007  . 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 SCOLIOSIS NEC 01/25/2007  . Sebaceous cyst 08/10/2010  . SMOKER 08/05/2009  . UPPER GASTROINTESTINAL HEMORRHAGE 01/25/2007  . Rectal abscess   . Pancreatitis   . Fatty liver 10/09/2010  . Hiatal hernia   . Heart murmur     hx of   . Arthritis    Past Surgical History  Procedure Laterality Date  . Excision of thyroid mass    . Abdominal exploration surgery  1988  . Hernia repair      ventral  . Cholecystectomy  05/20/08    Dr. Zella Richer  .  Excision of scalp lesion  07/2009  . Shoulder surgery      right  . Ganglion cyst excision      right foot x 2  . Incisional hernia repair  12/01/2011    Procedure: LAPAROSCOPIC INCISIONAL HERNIA;  Surgeon: Harl Bowie, MD;  Location: [facility];  Service: General;  Laterality: N/A;  . Esophagogastroduodenoscopy N/A 12/31/2013    Procedure: ESOPHAGOGASTRODUODENOSCOPY (EGD);  Surgeon: Gatha Mayer, MD;  Location: [facility];  Service: Endoscopy;  Laterality: N/A;  . Colonoscopy  04/23/2010    normal    reports that he has been smoking Cigarettes.  He has a 45 pack-year smoking history. He does not have any smokeless tobacco history on file. He reports that he does not drink alcohol or use illicit drugs. family history includes Hypertension in his father and mother; Prostate cancer in his other. There is no history of Colon cancer. Allergies  Allergen Reactions  . Celecoxib     REACTION: rectal bleeding  . Iohexol     Affects nerves: triggers schizophrenia  . Metoclopramide Hcl     Affects nerves: triggers schizophrenia   . Current Outpatient Prescriptions on File Prior to Visit  Medication Sig Dispense Refill  . alum & mag hydroxide-simeth (MAALOX/MYLANTA) 200-200-20 MG/5ML suspension Take 30 mLs by mouth every 6 (six) hours as needed for indigestion or heartburn (for upset stomach).    [name] amLODipine (NORVASC) 10 MG  tablet Take 1 tablet (10 mg total) by mouth daily. 90 tablet 3  . benztropine (COGENTIN) 0.5 MG tablet Take 0.5 mg by mouth 2 (two) times daily.     . calcium carbonate (TUMS - DOSED IN MG ELEMENTAL CALCIUM) 500 MG chewable tablet Chew 2 tablets by mouth daily as needed for indigestion or heartburn (for upset stomach).    . fluPHENAZine (PROLIXIN) 10 MG tablet Take 10 mg by mouth at bedtime.    . nicotine (NICOTROL) 10 MG inhaler Inhale 1 cartridge (1 continuous puffing total) into the lungs as needed for smoking cessation. Use no more than 10 cartridges a day - reduce daily  dose gradually 168 each 1  . pantoprazole (PROTONIX) 40 MG tablet Take 1 tablet (40 mg total) by mouth 2 (two) times daily. 60 tablet 2  . potassium chloride SA (K-DUR,KLOR-CON) 20 MEQ tablet Take 1 tablet (20 mEq total) by mouth daily. 90 tablet 3  . promethazine (PHENERGAN) 25 MG tablet Take 1 tablet (25 mg total) by mouth every 6 (six) hours as needed for nausea or vomiting. 15 tablet 0   No current facility-administered medications on file prior to visit.   Review of Systems  Constitutional: Negative for unusual diaphoresis or other sweats  HENT: Negative for ringing in ear Eyes: Negative for double vision or worsening visual disturbance.  Respiratory: Negative for choking and stridor.   Gastrointestinal: Negative for vomiting or other signifcant bowel change Genitourinary: Negative for hematuria or decreased urine volume.  Musculoskeletal: Negative for other MSK pain or swelling Skin: Negative for color change and worsening wound.  Neurological: Negative for tremors and numbness other than noted  Psychiatric/Behavioral: Negative for decreased concentration or agitation other than above       Objective:   Physical Exam BP 132/82 mmHg  Pulse 98  Temp(Src) 98.9 F (37.2 C) (Oral)  Wt 238 lb 4 oz (108.069 kg)  SpO2 98%  VS noted,  Constitutional: Pt appears well-developed, well-nourished.  HENT: Head: NCAT.  Right Ear: External ear normal.  Left Ear: External ear normal.  Eyes: . Pupils are equal, round, and reactive to light. Conjunctivae and EOM are normal Neck: Normal range of motion. Neck supple.  Cardiovascular: Normal rate and regular rhythm.   Pulmonary/Chest: Effort normal and breath sounds normal.  Abd:  Soft, NT, ND, + BS Neurological: Pt is alert. Not confused , motor grossly intact, DTR/ sens intact to LT to UE's Right shoulder diffuse tender, crepitus, and decrease ROM to forward eleation and abduction Left shoulder NT, but with pain and reduced ROM to 100  degress to abduciton/forward elevation Both UE's o/w neurovasc intact Skin: Skin is warm. No rash, does have 2 small tender abscess left scrotum, nofluctuance, nondraining Psychiatric: Pt behavior at baseline I suspect. No agitation.      Assessment & Plan:

## 2014-03-05 NOTE — Progress Notes (Signed)
Pre visit review using our clinic review tool, if applicable. No additional management support is needed unless otherwise documented below in the visit note. 

## 2014-03-05 NOTE — Assessment & Plan Note (Signed)
Possible component of right radiculitis or radiculopathy but no UE weakness today, for predpac asd, declines muscle relaxer, for film, consider PT, also for sport med referral

## 2014-03-05 NOTE — Assessment & Plan Note (Signed)
Also for labs as per Dr Alain Marion prior order, stable overall by history and exam, recent data reviewed with pt, and pt to continue medical treatment as before,  to f/u any worsening symptoms or concerns Lab Results  Component Value Date   HGBA1C 5.8 10/30/2012

## 2014-03-05 NOTE — Patient Instructions (Addendum)
You had the steroid shot today  Please take all new medication as prescribed - the prednisone for a short time, and also the meloxicam as needed for pain  Please continue all other medications as before, and refills have been done if requested.  Please have the pharmacy call with any other refills you may need.  Please continue your efforts at being more active, low cholesterol diet, and weight control.  Please keep your appointments with your specialists as you may have planned  Please go to the XRAY Department in the Basement (go straight as you get off the elevator) for the x-ray testing  Please go to the LAB in the Basement (turn left off the elevator) for the tests to be done today, as ordered per Dr Alain Marion  You will be contacted by phone if any changes need to be made immediately.  Otherwise, you will receive a letter about your results with an explanation, but please check with MyChart first.  Please remember to sign up for MyChart if you have not done so, as this will be important to you in the future with finding out test results, communicating by private email, and scheduling acute appointments online when needed.

## 2014-03-06 LAB — VITAMIN D 25 HYDROXY (VIT D DEFICIENCY, FRACTURES): Vit D, 25-Hydroxy: 21 ng/mL — ABNORMAL LOW (ref 30–100)

## 2014-03-07 ENCOUNTER — Other Ambulatory Visit: Payer: Self-pay | Admitting: Internal Medicine

## 2014-03-07 MED ORDER — ERGOCALCIFEROL 1.25 MG (50000 UT) PO CAPS: 50000.0000 [IU] | ORAL_CAPSULE | ORAL | Status: DC

## 2014-04-11 ENCOUNTER — Other Ambulatory Visit (INDEPENDENT_AMBULATORY_CARE_PROVIDER_SITE_OTHER): Payer: Medicare Other

## 2014-04-11 ENCOUNTER — Encounter: Payer: Self-pay | Admitting: Family Medicine

## 2014-04-11 ENCOUNTER — Ambulatory Visit (INDEPENDENT_AMBULATORY_CARE_PROVIDER_SITE_OTHER): Payer: Medicare Other | Admitting: Family Medicine

## 2014-04-11 VITALS — BP 128/80 | HR 100 | Ht 70.0 in | Wt 238.0 lb

## 2014-04-11 DIAGNOSIS — M7550 Bursitis of unspecified shoulder: Secondary | ICD-10-CM | POA: Insufficient documentation

## 2014-04-11 DIAGNOSIS — M25511 Pain in right shoulder: Secondary | ICD-10-CM

## 2014-04-11 DIAGNOSIS — M7551 Bursitis of right shoulder: Secondary | ICD-10-CM

## 2014-04-11 NOTE — Progress Notes (Signed)
[name] [address], [place] [address] Phone: [phone] Subjective:    I'm seeing this patient by the request  of:  Walker Kehr, MD   CC: bilateral shoulder pain.   RJJ:[id number] Daking Sek is a 58 y.o. male coming in with complaint of Bilateral shoulder and neck pain. Patient states his right shoulder hurts more than his left shoulder. Patient states it is more of a dull throbbing aching pain that is worse with activity.patient states that he did have surgery on the right shoulder.I do not have any notes on the surgery. Patient states that this pain seems to radiate up to the right side of his neck. States that it can wake him up at night. Stopping him from different activity. Patient states that there is an audible popping that can be painful.patient rates the severity of 9 out of 10. Patient would like to be active but has had multiple different medical problems over the years.  Patient did have x-rays were ordered by primary care physician. These were reviewed by me showing mild osteophytic changes of multiple cervical joints but minimal in nature. Patient's shoulder overall is unremarkable.     Past medical history, social, surgical and family history all reviewed in electronic medical record.   Review of Systems: No headache, visual changes, nausea, vomiting, diarrhea, constipation, dizziness, abdominal pain, skin rash, fevers, chills, night sweats, weight loss, swollen lymph nodes, body aches, joint swelling, muscle aches, chest pain, shortness of breath, mood changes.   Objective Blood pressure 128/80, weight 238 lb (107.956 kg).  General: No apparent distress alert and oriented x3 mood and affect normal, dressed appropriately.  HEENT: Pupils equal, extraocular movements intact  Respiratory: Patient's speak in full sentences and does not appear short of breath  Cardiovascular: No lower extremity edema, non tender, no erythema    Skin: Warm dry intact with no signs of infection or rash on extremities or on axial skeleton.  Abdomen: Soft nontender  Neuro: Cranial nerves II through XII are intact, neurovascularly intact in all extremities with 2+ DTRs and 2+ pulses.  Lymph: No lymphadenopathy of posterior or anterior cervical chain or axillae bilaterally.  Gait normal with good balance and coordination.  MSK:  Non tender with full range of motion and good stability and symmetric strength and tone of  elbows, wrist, hip, knee and ankles bilaterally.  Neck: Inspection unremarkable. No palpable stepoffs. Negative Spurling's maneuver. Full neck range of motion Grip strength and sensation normal in bilateral hands Strength good C4 to T1 distribution No sensory change to C4 to T1 Negative Hoffman sign bilaterally Reflexes normal  Shoulder: Right Inspection reveals no abnormalities, atrophy or asymmetry. Palpation is normal with no tenderness over AC joint or bicipital groove. ROM is full in all planes passively. Rotator cuff strength normal throughout. signs of impingement with positive Neer and Hawkin's tests, but negative empty can sign. Speeds and Yergason's tests normal. No labral pathology noted with negative Obrien's, negative clunk and good stability. Normal scapular function observed. No painful arc and no drop arm sign. No apprehension sign  MSK US performed of: Right This study was ordered, performed, and interpreted by Charlann Boxer D.O.  Shoulder:   Supraspinatus:  Appears normal on long and transverse views, Bursal bulge seen with shoulder abduction on impingement view. Infraspinatus:  Appears normal on long and transverse views. Significant increase in Doppler flow Subscapularis:  Appears normal on long and transverse views. Positive bursa Teres Minor:  Appears  normal on long and transverse views. AC joint:  Capsule undistended, no geyser sign. Glenohumeral Joint:  Appears normal without  effusion. Glenoid Labrum:  Intact without visualized tears. Biceps Tendon:  Appears normal on long and transverse views, no fraying of tendon, tendon located in intertubercular groove, no subluxation with shoulder internal or external rotation.  Impression: Subacromial bursitis  Procedure: Real-time Ultrasound Guided Injection of right glenohumeral joint Device: GE Logiq E  Ultrasound guided injection is preferred based studies that show increased duration, increased effect, greater accuracy, decreased procedural pain, increased response rate with ultrasound guided versus blind injection.  Verbal informed consent obtained.  Time-out conducted.  Noted no overlying erythema, induration, or other signs of local infection.  Skin prepped in a sterile fashion.  Local anesthesia: Topical Ethyl chloride.  With sterile technique and under real time ultrasound guidance:  Joint visualized.  23g 1  inch needle inserted posterior approach. Pictures taken for needle placement. Patient did have injection of 2 cc of 1% lidocaine, 2 cc of 0.5% Marcaine, and 1.0 cc of Kenalog 40 mg/dL. Completed without difficulty  Pain immediately resolved suggesting accurate placement of the medication.  Advised to call if fevers/chills, erythema, induration, drainage, or persistent bleeding.  Images permanently stored and available for review in the ultrasound unit.  Impression: Technically successful ultrasound guided injection.     Impression and Recommendations:     This case required medical decision making of moderate complexity.

## 2014-04-11 NOTE — Patient Instructions (Signed)
Good to see you Ice 20 minutes 2 times daily. Usually after activity and before bed. Exercises 3 times a week. Alternate the neck and the shoulder Vitamin D 2000 IU daily.  Get in the gym! See me again in 4 weeks

## 2014-04-11 NOTE — Assessment & Plan Note (Signed)
Patient was given an injection today, home exercises, icing protocol, we discussed over-the-counter medications that could be beneficial as well. Patient overall had near complete resolution of pain after the injection. I'm optimistic he will respond well to conservative therapy. Differential does include cervical radiculopathy. Think this is a lower likelihood. We will continue to monitor. Patient come back in 4 weeks for further evaluation and treatment.

## 2014-04-22 ENCOUNTER — Telehealth: Payer: Self-pay | Admitting: Internal Medicine

## 2014-04-22 MED ORDER — PANTOPRAZOLE SODIUM 40 MG PO TBEC: 40.0000 mg | DELAYED_RELEASE_TABLET | Freq: Two times a day (BID) | ORAL | Status: DC

## 2014-04-22 NOTE — Telephone Encounter (Signed)
Pt request refill for his acid reflux to be send to [facility] on [address]. Please advise.

## 2014-04-22 NOTE — Telephone Encounter (Signed)
Notified pt rx sent to [facility]mb 

## 2014-05-02 ENCOUNTER — Encounter (HOSPITAL_COMMUNITY): Payer: Self-pay | Admitting: Gastroenterology

## 2014-05-09 ENCOUNTER — Ambulatory Visit: Payer: Medicare Other | Admitting: Family Medicine

## 2014-06-21 ENCOUNTER — Other Ambulatory Visit: Payer: Self-pay | Admitting: Internal Medicine

## 2014-06-25 DIAGNOSIS — M5412 Radiculopathy, cervical region: Secondary | ICD-10-CM | POA: Diagnosis not present

## 2014-08-01 ENCOUNTER — Other Ambulatory Visit (INDEPENDENT_AMBULATORY_CARE_PROVIDER_SITE_OTHER): Payer: Medicare Other

## 2014-08-01 ENCOUNTER — Encounter: Payer: Self-pay | Admitting: Internal Medicine

## 2014-08-01 ENCOUNTER — Ambulatory Visit (INDEPENDENT_AMBULATORY_CARE_PROVIDER_SITE_OTHER): Payer: Medicare Other | Admitting: Internal Medicine

## 2014-08-01 VITALS — BP 130/88 | HR 94 | Temp 98.4°F | Ht 70.0 in | Wt 239.2 lb

## 2014-08-01 DIAGNOSIS — R972 Elevated prostate specific antigen [PSA]: Secondary | ICD-10-CM

## 2014-08-01 DIAGNOSIS — R7302 Impaired glucose tolerance (oral): Secondary | ICD-10-CM

## 2014-08-01 DIAGNOSIS — L0293 Carbuncle, unspecified: Secondary | ICD-10-CM | POA: Diagnosis not present

## 2014-08-01 DIAGNOSIS — N528 Other male erectile dysfunction: Secondary | ICD-10-CM

## 2014-08-01 LAB — PSA: PSA: 5.55 ng/mL — AB (ref 0.10–4.00)

## 2014-08-01 LAB — HEMOGLOBIN A1C: HEMOGLOBIN A1C: 5.7 % (ref 4.6–6.5)

## 2014-08-01 MED ORDER — DOXYCYCLINE HYCLATE 100 MG PO TABS: 100.0000 mg | ORAL_TABLET | Freq: Two times a day (BID) | ORAL | Status: DC

## 2014-08-01 MED ORDER — MUPIROCIN 2 % EX OINT: TOPICAL_OINTMENT | CUTANEOUS | Status: DC

## 2014-08-01 NOTE — Patient Instructions (Addendum)
Fill the tub half full and add cap full of bleach. Stir the  water extremely well and then bathe; cleansing areas with  lesions . Repeat this once a week x3. This is to eradicate recurrent skin infections.  Please consider using the E cigarette as we discussed. This could possibly decrease inflammation of the airways Your next office appointment will be determined based upon review of your pending labs 7/or xrays  Those instructions will be transmitted to you by mail for your records.  Critical results will be called.   Followup as needed for any active or acute issue. Please report any significant change in your symptoms.

## 2014-08-01 NOTE — Progress Notes (Signed)
Pre visit review using our clinic review tool, if applicable. No additional management support is needed unless otherwise documented below in the visit note. 

## 2014-08-01 NOTE — Progress Notes (Signed)
   Subjective:    Patient ID: [name], male    DOB: Oct 13, 1955, 59 y.o.   MRN: [id number]  HPI He has had recurrent pustules in the groin and inter gluteal area since age 55. They're associated with itching and tenderness. Neosporin topically "brings these to a head"but they recur. He states that these have been lanced "over 100 times". He now waits until they come to a head and lances them with a "burnt needle".  He has no knowledge of any diagnosis of MRSA.  He is also concerned about smoking and wishes options to stop. Nicotrol was prescribed 01/08/14 but was cost prohibitive. He states that his schizophrenia makes it difficult for him to try to stop smoking.  His PSA was 5.47 on 03/05/14. There's been no follow-up. He does describe erectile dysfunction.   He has a past history of impaired glucose tolerance. Last A1c on record was 5.7% on 03/05/14.    Review of Systems He denies fever, chills, or sweats.  There is no active pustular lesions at this time.    Objective:   Physical Exam  Pertinent or positive findings include: BMI; 343.33 He has multiple missing teeth with extremely poor dentition with caries to and below the gumline.  Coarse rhonchi are noted at the bases.  Pedal pulses are decreased particularly the dorsalis pedis pulses.  He has scattered papular lesions without associated cellulitis or pustule formation in the groin area.  He has no inguinal lymphadenopathy.  General appearance :adequately nourished; in no distress. Eyes: No conjunctival inflammation or scleral icterus is present. Oral exam:  Lips and gums are healthy appearing.There is no oropharyngeal erythema or exudate noted.  Heart:  Normal rate and regular rhythm. S1 and S2 normal without gallop, murmur, click, rub or other extra sounds   Lungs:No increased work of breathing.  Abdomen: bowel sounds normal, soft and non-tender without masses, organomegaly or hernias noted.  No guarding or rebound.    Vascular : all pulses equal ; no bruits present. Skin:Warm & dry; no tenting  Lymphatic: No lymphadenopathy is noted about the head, neck, axilla, or inguinal areas.  Neuro: Strength, tone  normal.        Assessment & Plan:  #1 recurrent pustules #2 elevated PSA #3 smoker with clinical evidence of reactive airways disease #4 history of impaired glucose tolerance; rule out diabetes as contributing factor in the recurrent skin infections  Plan: See orders and recommendations

## 2014-08-01 NOTE — Progress Notes (Signed)
   Subjective:    Patient ID: [name], male    DOB: 12-21-1955, 59 y.o.   MRN: [id number]  HPI  He believes that he had a "syringe" removed from his abdomen approximately 6 months ago. Endoscopy was performed 12/31/13; this revealed esophagitis and a 4 cm hiatal hernia. No foreign body was noted. CT artifact was suggested.  Review of Systems He denies associated fever, chills, or change in weight.  There's been no associated lymph node enlargement or abnormal bruising or bleeding.  He recently had nausea and vomiting in the context of a 24-hour gastroenteritis. He denies any associated diarrhea.    Objective:   Physical Exam        Assessment & Plan:

## 2014-08-04 ENCOUNTER — Other Ambulatory Visit: Payer: Self-pay | Admitting: Internal Medicine

## 2014-08-04 DIAGNOSIS — R972 Elevated prostate specific antigen [PSA]: Secondary | ICD-10-CM | POA: Insufficient documentation

## 2014-08-06 ENCOUNTER — Telehealth: Payer: Self-pay | Admitting: Internal Medicine

## 2014-08-06 NOTE — Telephone Encounter (Signed)
Phone call to patient. He has been advised of a normal a1c and elevation of psa. I advised he will be contacted by our office for a urology consult

## 2014-08-06 NOTE — Telephone Encounter (Signed)
Pt called in and is requesting call back from nurse with his lab results

## 2014-08-07 ENCOUNTER — Encounter: Payer: Self-pay | Admitting: Internal Medicine

## 2014-08-13 ENCOUNTER — Telehealth: Payer: Self-pay | Admitting: Internal Medicine

## 2014-08-13 NOTE — Telephone Encounter (Signed)
Patient Name: [name] DOB: 1956-04-18 Initial Comment Caller states bp is 151/94, hr 104. Feeling okay, but can tell something is wrong. Nurse Assessment Nurse: Marcelline Deist, RN, Lynda Date/Time (Eastern Time): 08/13/2014 1:43:40 PM Confirm and document reason for call. If symptomatic, describe symptoms. ---Caller states BP is 151/94, HR 104. Feeling okay, but can tell something is wrong. Was feeling weak & nausea. Has hernias, no gall bladder or appendix. Seen by Dr. recently. Is on Amlodipine for BP. Has the patient traveled out of the country within the last 30 days? ---Not Applicable Does the patient require triage? ---Yes Related visit to physician within the last 2 weeks? ---Yes Does the PT have any chronic conditions? (i.e. diabetes, asthma, etc.) ---Yes List chronic conditions. ---prostate, back, hernias, BP rx, thyroid Guidelines Guideline Title Affirmed Question Affirmed Notes High Blood Pressure [1] Taking BP medications AND [2] feels is having side effects (e.g., impotence, cough, dizzy upon standing) Final Disposition User See PCP When Office is Open (within 3 days) Marcelline Deist, RN, Kermit Balo Comments Caller is difficult to understand at times, telling nurse about all the surgeries he has had through the years & his medical history. Requested appt. for Thursday.

## 2014-08-14 ENCOUNTER — Telehealth: Payer: Self-pay | Admitting: *Deleted

## 2014-08-14 NOTE — Telephone Encounter (Signed)
[facility] Patient Name: [name] Gender: Male DOB: 03-12-1956 Age: 59 Y 62 M 6 D Return Phone Number: [phone] (Primary), [phone] (Secondary) Address: [address] City/State/Zip:  Client [facility] - Client Client Site [place] - Day Physician Plotnikov, Alex Contact Type Call Call Type Triage / Clinical Relationship To Patient Self Appointment Disposition EMR Appointment Scheduled Info pasted into Epic Yes Return Phone Number [phone] (Primary) Chief Complaint Blood Pressure High Initial Comment Caller states bp is 151/94, hr 104. Feeling okay, but can tell something is wrong. PreDisposition Call Doctor Nurse Assessment Nurse: Marcelline Deist, RN, Kermit Balo Date/Time Eilene Ghazi Time): 08/13/2014 1:43:40 PM Confirm and document reason for call. If symptomatic, describe symptoms. ---Caller states BP is 151/94, HR 104. Feeling okay, but can tell something is wrong. Was feeling weak & nausea. Has hernias, no gall bladder or appendix. Seen by Dr. recently. Is on Amlodipine for BP. Has the patient traveled out of the country within the last 30 days? ---Not Applicable Does the patient require triage? ---Yes Related visit to physician within the last 2 weeks? ---Yes Does the PT have any chronic conditions? (i.e. diabetes, asthma, etc.) ---Yes List chronic conditions. ---prostate, back, hernias, BP rx, thyroid Guidelines Guideline Title Affirmed Question Affirmed Notes Nurse Date/Time (Eastern Time) High Blood Pressure [1] Taking BP medications AND [2] feels is having side effects (e.g., impotence, cough, dizzy upon standing) Marcelline Deist, RN, Lynda 08/13/2014 1:49:51 PM Disp. Time Eilene Ghazi Time) Disposition Final User 08/13/2014 1:58:33 PM See PCP When Office is Open (within 3 days) Yes Marcelline Deist, RN, Kermit Balo PLEASE NOTE: All timestamps contained  within this report are represented as [place] Standard Time. CONFIDENTIALTY NOTICE: This fax transmission is intended only for the addressee. It contains information that is legally privileged, confidential or otherwise protected from use or disclosure. If you are not the intended recipient, you are strictly prohibited from reviewing, disclosing, copying using or disseminating any of this information or taking any action in reliance on or regarding this information. If you have received this fax in error, please notify us immediately by telephone so that we can arrange for its return to [place]. Phone: [phone], Toll-Free: [phone], Fax: [phone] Page: 2 of 2 Call Id: [phone] [place] Understands: Yes Disagree/Comply: Comply Care Advice Given Per Guid

## 2014-08-15 ENCOUNTER — Ambulatory Visit: Payer: Self-pay | Admitting: Internal Medicine

## 2014-09-09 ENCOUNTER — Emergency Department (HOSPITAL_COMMUNITY)
Admission: EM | Admit: 2014-09-09 | Discharge: 2014-09-09 | Disposition: A | Discharge status: Home / Self Care | Payer: Medicare Other | Attending: Emergency Medicine | Admitting: Emergency Medicine

## 2014-09-09 DIAGNOSIS — K219 Gastro-esophageal reflux disease without esophagitis: Secondary | ICD-10-CM | POA: Insufficient documentation

## 2014-09-09 DIAGNOSIS — Z8659 Personal history of other mental and behavioral disorders: Secondary | ICD-10-CM | POA: Diagnosis not present

## 2014-09-09 DIAGNOSIS — M24811 Other specific joint derangements of right shoulder, not elsewhere classified: Secondary | ICD-10-CM | POA: Diagnosis not present

## 2014-09-09 DIAGNOSIS — I10 Essential (primary) hypertension: Secondary | ICD-10-CM | POA: Insufficient documentation

## 2014-09-09 DIAGNOSIS — R011 Cardiac murmur, unspecified: Secondary | ICD-10-CM | POA: Insufficient documentation

## 2014-09-09 DIAGNOSIS — M25511 Pain in right shoulder: Secondary | ICD-10-CM | POA: Insufficient documentation

## 2014-09-09 DIAGNOSIS — Z87438 Personal history of other diseases of male genital organs: Secondary | ICD-10-CM | POA: Insufficient documentation

## 2014-09-09 DIAGNOSIS — Z720 Tobacco use: Secondary | ICD-10-CM | POA: Diagnosis not present

## 2014-09-09 DIAGNOSIS — Z8639 Personal history of other endocrine, nutritional and metabolic disease: Secondary | ICD-10-CM | POA: Insufficient documentation

## 2014-09-09 DIAGNOSIS — Z791 Long term (current) use of non-steroidal anti-inflammatories (NSAID): Secondary | ICD-10-CM | POA: Insufficient documentation

## 2014-09-09 DIAGNOSIS — M1990 Unspecified osteoarthritis, unspecified site: Secondary | ICD-10-CM | POA: Insufficient documentation

## 2014-09-09 DIAGNOSIS — Z8709 Personal history of other diseases of the respiratory system: Secondary | ICD-10-CM | POA: Diagnosis not present

## 2014-09-09 DIAGNOSIS — Z79899 Other long term (current) drug therapy: Secondary | ICD-10-CM | POA: Diagnosis not present

## 2014-09-09 DIAGNOSIS — Z872 Personal history of diseases of the skin and subcutaneous tissue: Secondary | ICD-10-CM | POA: Diagnosis not present

## 2014-09-09 NOTE — ED Provider Notes (Signed)
CSN: [id number]     Arrival date & time 09/09/14  1803 History   First MD Initiated Contact with Patient 09/09/14 1841     This chart was scribed for non-physician practitioner, Al Corpus PA-C working with Serita Grit, MD by [name], ED Scribe. This patient was seen in room WTR5/WTR5 and the patient's care was started at 7:23 PM.   Chief Complaint  Patient presents with  . Shoulder Pain   The history is provided by the patient. No language interpreter was used.    HPI Comments: [name] is a 59 y.o. male with a PMHx of hyperlipidemia, schizophrenia, HTN, and arthritis who presents to the Emergency Department complaining of intermittent, ongoing, chronic in nature R sided shoulder pain that radiates into the R side of the neck for several years. Pain is exacerbated with movement without any alleviating factors. No new trauma or injury to shoulder. [name] has attempted OTC Ibuprofen and topical remedies without any relief. No recent fever or chills. Pt has been seen several times in the past by an orthopedist but denies recent follow up. Cortisone injections attempted in the past with improvement. Pt with known allergies to Iohexol and Metoclopramide HCL.   Past Medical History  Diagnosis Date  . ALLERGIC RHINITIS 01/25/2007  . ANXIETY DISORDER, GENERALIZED 01/25/2007  . DEPRESSION 01/25/2007  . ESOPHAGITIS 12/28/2007  . GERD 01/25/2007  . GLUCOSE INTOLERANCE, HX OF 01/25/2007  . HEMORRHOIDS, INTERNAL, WITH BLEEDING 04/26/2008  . HYPERLIPIDEMIA 12/27/2007  . HYPERTENSION, ESSENTIAL NOS 01/25/2007  . Hyperthyroidism 08/11/2010  . HYPERTROPHY PROSTATE W/UR OBST & OTH LUTS 02/27/2010  . 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 LOW BACK PAIN, CHRONIC 11/08/2007  . 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 SCOLIOSIS NEC 01/25/2007  . Sebaceous cyst 08/10/2010  . SMOKER 08/05/2009  . UPPER GASTROINTESTINAL HEMORRHAGE 01/25/2007  . Rectal abscess   . Pancreatitis   . Fatty liver  10/09/2010  . Hiatal hernia   . Heart murmur     hx of   . Arthritis    Past Surgical History  Procedure Laterality Date  . Excision of thyroid mass    . Abdominal exploration surgery  1988  . Hernia repair      ventral  . Cholecystectomy  05/20/08    Dr. Zella Richer  . Excision of scalp lesion  07/2009  . Shoulder surgery      right  . Ganglion cyst excision      right foot x 2  . Incisional hernia repair  12/01/2011    Procedure: LAPAROSCOPIC INCISIONAL HERNIA;  Surgeon: Harl Bowie, MD;  Location: [facility];  Service: General;  Laterality: N/A;  . Esophagogastroduodenoscopy N/A 12/31/2013    Procedure: ESOPHAGOGASTRODUODENOSCOPY (EGD);  Surgeon: Gatha Mayer, MD;  Location: [facility];  Service: Endoscopy;  Laterality: N/A;  . Colonoscopy  04/23/2010    normal   Family History  Problem Relation Age of Onset  . Hypertension Father   . Hypertension Mother   . Prostate cancer Other   . Colon cancer Neg Hx    History  Substance Use Topics  . Smoking status: Current Every Day Smoker -- 1.50 packs/day for 30 years    Types: Cigarettes  . Smokeless tobacco: Not on file  . Alcohol Use: No    Review of Systems  Constitutional: Negative for fever and chills.  Musculoskeletal: Positive for arthralgias. Negative for gait problem.  Neurological: Negative for weakness and numbness.      Allergies  Celecoxib; Iohexol; and Metoclopramide hcl  Home Medications   Prior to Admission medications   Medication Sig Start Date End Date Taking? Authorizing Provider  amLODipine (NORVASC) 10 MG tablet Take 1 tablet (10 mg total) by mouth daily. 11/22/13  Yes Aleksei Plotnikov V, MD  benztropine (COGENTIN) 0.5 MG tablet Take 0.5 mg by mouth 2 (two) times daily as needed for tremors.  12/19/13  Yes Historical Provider, MD  Cholecalciferol (VITAMIN D-3) 1000 UNITS CAPS Take 2,000 Units by mouth daily.   Yes Historical Provider, MD  fluPHENAZine (PROLIXIN) 10 MG tablet Take 10 mg by mouth at  bedtime.   Yes Historical Provider, MD  meloxicam (MOBIC) 15 MG tablet Take 1 tablet (15 mg total) by mouth daily. As needed for pain 03/05/14  Yes Biagio Borg, MD  pantoprazole (PROTONIX) 40 MG tablet Take 1 tablet (40 mg total) by mouth 2 (two) times daily. 04/22/14  Yes Aleksei Plotnikov V, MD  potassium chloride SA (K-DUR,KLOR-CON) 20 MEQ tablet Take 1 tablet (20 mEq total) by mouth daily. 02/18/14  Yes Aleksei Plotnikov V, MD  alum & mag hydroxide-simeth (MAALOX/MYLANTA) 200-200-20 MG/5ML suspension Take 30 mLs by mouth every 6 (six) hours as needed for indigestion or heartburn (for upset stomach).    Historical Provider, MD  calcium carbonate (TUMS - DOSED IN MG ELEMENTAL CALCIUM) 500 MG chewable tablet Chew 2 tablets by mouth daily as needed for indigestion or heartburn (for upset stomach).    Historical Provider, MD  doxycycline (VIBRA-TABS) 100 MG tablet Take 1 tablet (100 mg total) by mouth 2 (two) times daily. Patient not taking: Reported on 09/09/2014 08/01/14   Hendricks Limes, MD  ergocalciferol (VITAMIN D2) 50000 UNITS capsule Take 1 capsule (50,000 Units total) by mouth once a week. Patient not taking: Reported on 09/09/2014 03/07/14   Cassandria Anger, MD  mupirocin ointment (BACTROBAN) 2 % Applied twice a day to the affected area;NOT into eyes. Patient not taking: Reported on 09/09/2014 08/01/14   Hendricks Limes, MD   Triage Vitals: BP 144/100 mmHg  Pulse 87  Temp(Src) 98 F (36.7 C) (Oral)  Resp 16  SpO2 99%   Physical Exam  Constitutional: He appears well-developed and well-nourished. No distress.  HENT:  Head: Normocephalic and atraumatic.  Eyes: Conjunctivae are normal. Right eye exhibits no discharge. Left eye exhibits no discharge.  Pulmonary/Chest: Effort normal. No respiratory distress.  Musculoskeletal: He exhibits tenderness.  No step off or crepitus to clavicle  No tenderness to clavicle FROM of R shoulder with tenderness to R shoulder at [facility] joint with  crepitus Tenderness to R trapezius   Neurological: He is alert. Coordination normal.  2 plus radial pulse equal bilaterally  5/5 strength in RUE and Sensation intact  Skin: He is not diaphoretic.  Psychiatric: He has a normal mood and affect. His behavior is normal.  Nursing note and vitals reviewed.   ED Course  Procedures (including critical care time)  DIAGNOSTIC STUDIES: Oxygen Saturation is 99% on RA, Normal by my interpretation.    COORDINATION OF CARE: 7:30 PM- Advised pt to continue with topical and heat application to help manage pain. Also encourage follow up with orthopedics. Discussed treatment plan with pt at bedside and pt agreed to plan.     Labs Review Labs Reviewed - No data to display  Imaging Review No results found.   EKG Interpretation None      MDM   Final diagnoses:  Right shoulder pain  Shoulder joint crepitus,  right   Pt presents with R shoulder pain for several years with prior diagnosis of arthritis. Neurovascularly intact with FROM with crepitus. Discussed RICE and follow up with orthopedics. Referral provided.  Discussed return precautions with patient. Patient verbalizes understanding and agrees with plan.  I personally performed the services described in this documentation, which was scribed in my presence. The recorded information has been reviewed and is accurate.   Al Corpus, PA-C 09/10/14 [place], MD 09/13/14 2032

## 2014-09-09 NOTE — ED Notes (Signed)
Pt c/o right shoulder pain radiating into right neck, right trapezius area, and right pectoral area worsening with movement. Pt states that this pain has been going on for several years, denies any worsening, pt states he has been seen for the same, told the problem was with his rotator cuff and that he needs to exercise more. Pt denies any new symptoms or new pain today.

## 2014-09-09 NOTE — Discharge Instructions (Signed)
Return to the emergency room with worsening of symptoms, new symptoms or with symptoms that are concerning, especially fevers, chills, nausea, vomiting, redness, swelling, unable to move shoulder. RICE: Rest, Ice (three cycles of 20 mins on, 46mins off at least twice a day), compression/brace, elevation. Heating pad works well for muscle  pain. Ibuprofen 400mg  (2 tablets 200mg ) every 5-6 hours for 3-5 days. Call to make follow up appointment with orthopedics for further evaluation. Read below information and follow recommendations. Osteoarthritis Osteoarthritis is a disease that causes soreness and inflammation of a joint. It occurs when the cartilage at the affected joint wears down. Cartilage acts as a cushion, covering the ends of bones where they meet to form a joint. Osteoarthritis is the most common form of arthritis. It often occurs in older people. The joints affected most often by this condition include those in the:  Ends of the fingers.  Thumbs.  Neck.  Lower back.  Knees.  Hips. CAUSES  Over time, the cartilage that covers the ends of bones begins to wear away. This causes bone to rub on bone, producing pain and stiffness in the affected joints.  RISK FACTORS Certain factors can increase your chances of having osteoarthritis, including:  Older age.  Excessive body weight.  Overuse of joints.  Previous joint injury. SIGNS AND SYMPTOMS   Pain, swelling, and stiffness in the joint.  Over time, the joint may lose its normal shape.  Small deposits of bone (osteophytes) may grow on the edges of the joint.  Bits of bone or cartilage can break off and float inside the joint space. This may cause more pain and damage. DIAGNOSIS  Your health care provider will do a physical exam and ask about your symptoms. Various tests may be ordered, such as:  X-rays of the affected joint.  An MRI scan.  Blood tests to rule out other types of arthritis.  Joint fluid tests. This  involves using a needle to draw fluid from the joint and examining the fluid under a microscope. TREATMENT  Goals of treatment are to control pain and improve joint function. Treatment plans may include:  A prescribed exercise program that allows for rest and joint relief.  A weight control plan.  Pain relief techniques, such as:  Properly applied heat and cold.  Electric pulses delivered to nerve endings under the skin (transcutaneous electrical nerve stimulation [TENS]).  Massage.  Certain nutritional supplements.  Medicines to control pain, such as:  Acetaminophen.  Nonsteroidal anti-inflammatory drugs (NSAIDs), such as naproxen.  Narcotic or central-acting agents, such as tramadol.  Corticosteroids. These can be given orally or as an injection.  Surgery to reposition the bones and relieve pain (osteotomy) or to remove loose pieces of bone and cartilage. Joint replacement may be needed in advanced states of osteoarthritis. HOME CARE INSTRUCTIONS   Take medicines only as directed by your health care provider.  Maintain a healthy weight. Follow your health care provider's instructions for weight control. This may include dietary instructions.  Exercise as directed. Your health care provider can recommend specific types of exercise. These may include:  Strengthening exercises. These are done to strengthen the muscles that support joints affected by arthritis. They can be performed with weights or with exercise bands to add resistance.  Aerobic activities. These are exercises, such as brisk walking or low-impact aerobics, that get your heart pumping.  Range-of-motion activities. These keep your joints limber.  Balance and agility exercises. These help you maintain daily living skills.  Rest  your affected joints as directed by your health care provider.  Keep all follow-up visits as directed by your health care provider. SEEK MEDICAL CARE IF:   Your skin turns  red.  You develop a rash in addition to your joint pain.  You have worsening joint pain.  You have a fever along with joint or muscle aches. SEEK IMMEDIATE MEDICAL CARE IF:  You have a significant loss of weight or appetite.  You have night sweats. Tome of Arthritis and Musculoskeletal and Skin Diseases: www.niams.SouthExposed.es  [employer] on Aging: http://kim-miller.com/  American College of Rheumatology: www.rheumatology.org Document Released: 04/05/2005 Document Revised: 08/20/2013 Document Reviewed: 12/11/2012 [facility] Patient Information 2015 [place], [place]. This information is not intended to replace advice given to you by your health care provider. Make sure you discuss any questions you have with your health care provider.    Arthralgia Arthralgia is joint pain. A joint is a place where two bones meet. Joint pain can happen for many reasons. The joint can be bruised, stiff, infected, or weak from aging. Pain usually goes away after resting and taking medicine for soreness.  HOME CARE  Rest the joint as told by your doctor.  Keep the sore joint raised (elevated) for the first 24 hours.  Put ice on the joint area.  Put ice in a plastic bag.  Place a towel between your skin and the bag.  Leave the ice on for 15-20 minutes, 03-04 times a day.  Wear your splint, casting, elastic bandage, or sling as told by your doctor.  Only take medicine as told by your doctor. Do not take aspirin.  Use crutches as told by your doctor. Do not put weight on the joint until told to by your doctor. GET HELP RIGHT AWAY IF:   You have bruising, puffiness (swelling), or more pain.  Your fingers or toes turn blue or start to lose feeling (numb).  Your medicine does not lessen the pain.  Your pain becomes severe.  You have a temperature by mouth above 102 F (38.9 C), not controlled by medicine.  You cannot move or use the joint. MAKE SURE YOU:    Understand these instructions.  Will watch your condition.  Will get help right away if you are not doing well or get worse. Document Released: 03/24/2009 Document Revised: 06/28/2011 Document Reviewed: 03/24/2009 [facility] Patient Information 2015 [place], [place]. This information is not intended to replace advice given to you by your health care provider. Make sure you discuss any questions you have with your health care provider.

## 2014-09-26 ENCOUNTER — Other Ambulatory Visit: Payer: Self-pay | Admitting: Internal Medicine

## 2014-10-14 ENCOUNTER — Other Ambulatory Visit: Payer: Self-pay | Admitting: Internal Medicine

## 2014-11-07 ENCOUNTER — Ambulatory Visit: Payer: Medicare Other | Admitting: Internal Medicine

## 2014-12-30 ENCOUNTER — Other Ambulatory Visit: Payer: Self-pay | Admitting: Internal Medicine

## 2015-01-07 ENCOUNTER — Encounter: Payer: Self-pay | Admitting: Internal Medicine

## 2015-01-14 ENCOUNTER — Other Ambulatory Visit: Payer: Self-pay | Admitting: Internal Medicine

## 2015-01-27 ENCOUNTER — Other Ambulatory Visit: Payer: Self-pay | Admitting: Internal Medicine

## 2015-01-31 ENCOUNTER — Ambulatory Visit (INDEPENDENT_AMBULATORY_CARE_PROVIDER_SITE_OTHER): Payer: Medicare Other | Admitting: Internal Medicine

## 2015-01-31 ENCOUNTER — Encounter: Payer: Self-pay | Admitting: Internal Medicine

## 2015-01-31 VITALS — BP 130/80 | HR 83 | Wt 241.0 lb

## 2015-01-31 DIAGNOSIS — M25511 Pain in right shoulder: Secondary | ICD-10-CM | POA: Diagnosis not present

## 2015-01-31 NOTE — Progress Notes (Signed)
Pre visit review using our clinic review tool, if applicable. No additional management support is needed unless otherwise documented below in the visit note. 

## 2015-01-31 NOTE — Progress Notes (Signed)
   Subjective:    Patient ID: [name], male    DOB: 18-Mar-1956, 59 y.o.   MRN: [id number]  HPI He is having ongoing pain in the right shoulder and feels that he may have a "rotator cuff out of socket". He believes he saw Dr. Shanon Brow" 7 months ago. Actually he saw Dr. Charlann Boxer who gave him an injection for subacromial bursitis 04/11/14. The note states there was always complete resolution.  Remotely he had seen Dr. Raliegh Ip and had received injections into the right shoulder.  He's also concerned about possible reaction with Prolixin and amlodipine. Apparently this was based on some  contact from a website organization. [employer] describes no such interaction.  He did see the Urologist and had prostate biopsies. He's been told he has prostate cancer in 3 places. Apparently surveillance would be continued rather than surgery. He is to be seen in December.   Review of Systems There is no associated numbness, tingling, or weakness in the right upper extremity.  There is no associated rash or change in color temperature skin in the area of the pain.    Objective:   Physical Exam Pertinent or positive findings include: His voice is very gravelly and hoarse.  He is able to elevate both arms above the head and clasp his hands above the head without difficulty. He states he does have some pain with rotation of the right shoulder.  General appearance :adequately nourished; in no distress.  Eyes: No conjunctival inflammation or scleral icterus is present.  Oral exam:  Lips and gums are healthy appearing.There is no oropharyngeal erythema or exudate noted. Dental hygiene is poor.  Heart:  Normal rate and regular rhythm. S1 and S2 normal without gallop, murmur, click, rub or other extra sounds    Lungs:Chest clear to auscultation; no wheezes, rhonchi,rales ,or rubs present.No increased work of breathing.    Skin:Warm & dry.  Intact without suspicious lesions or rashes ; no  tenting   Lymphatic: No lymphadenopathy is noted about the head, neck, axilla.   Neuro: Strength, tone  normal.     Assessment & Plan:  #1 shoulder pain, most likely is recurrence of subacromial bursitis. Clinically there is no sign of rotator cuff tear   #2 concern about reaction between Prolixin and amlodipine. The medical literature does not document such  #3 prostate cancer, under active surveillance.  Plan: He declined referral to Dr. Tamala Julian or back to Dr. Raliegh Ip. He states he had multiple shots in the last. At that point he got up and walked out of the office

## 2015-01-31 NOTE — Patient Instructions (Signed)
Please keep the follow-up appointment with your Urologist in December.  Please let me know if you would like to see Dr. Charlann Boxer or Dr. Percell Miller Noemi Chapel to evaluate the shoulder.

## 2015-02-03 ENCOUNTER — Other Ambulatory Visit: Payer: Self-pay | Admitting: Internal Medicine

## 2015-02-09 ENCOUNTER — Other Ambulatory Visit: Payer: Self-pay | Admitting: Internal Medicine

## 2015-02-10 ENCOUNTER — Telehealth: Payer: Self-pay | Admitting: Internal Medicine

## 2015-02-10 ENCOUNTER — Encounter (HOSPITAL_COMMUNITY): Payer: Self-pay | Admitting: Emergency Medicine

## 2015-02-10 ENCOUNTER — Emergency Department (HOSPITAL_COMMUNITY)
Admission: EM | Admit: 2015-02-10 | Discharge: 2015-02-10 | Discharge status: Against Medical Advice | Payer: Medicare Other | Attending: Emergency Medicine | Admitting: Emergency Medicine

## 2015-02-10 DIAGNOSIS — R011 Cardiac murmur, unspecified: Secondary | ICD-10-CM | POA: Insufficient documentation

## 2015-02-10 DIAGNOSIS — Z720 Tobacco use: Secondary | ICD-10-CM | POA: Insufficient documentation

## 2015-02-10 DIAGNOSIS — T8131XA Disruption of external operation (surgical) wound, not elsewhere classified, initial encounter: Secondary | ICD-10-CM | POA: Diagnosis not present

## 2015-02-10 DIAGNOSIS — K921 Melena: Secondary | ICD-10-CM | POA: Diagnosis not present

## 2015-02-10 DIAGNOSIS — R109 Unspecified abdominal pain: Secondary | ICD-10-CM | POA: Diagnosis not present

## 2015-02-10 DIAGNOSIS — G8929 Other chronic pain: Secondary | ICD-10-CM | POA: Diagnosis not present

## 2015-02-10 DIAGNOSIS — I10 Essential (primary) hypertension: Secondary | ICD-10-CM | POA: Diagnosis not present

## 2015-02-10 DIAGNOSIS — T8189XA Other complications of procedures, not elsewhere classified, initial encounter: Secondary | ICD-10-CM | POA: Diagnosis not present

## 2015-02-10 LAB — COMPREHENSIVE METABOLIC PANEL
ALK PHOS: 70 U/L (ref 38–126)
ALT: 21 U/L (ref 17–63)
ANION GAP: 9 (ref 5–15)
AST: 18 U/L (ref 15–41)
Albumin: 4.1 g/dL (ref 3.5–5.0)
BILIRUBIN TOTAL: 0.6 mg/dL (ref 0.3–1.2)
BUN: 10 mg/dL (ref 6–20)
CALCIUM: 9.7 mg/dL (ref 8.9–10.3)
CO2: 29 mmol/L (ref 22–32)
CREATININE: 1.35 mg/dL — AB (ref 0.61–1.24)
Chloride: 102 mmol/L (ref 101–111)
GFR, EST NON AFRICAN AMERICAN: 57 mL/min — AB (ref >60)
Glucose, Bld: 112 mg/dL — ABNORMAL HIGH (ref 65–99)
Potassium: 3.4 mmol/L — ABNORMAL LOW (ref 3.5–5.1)
SODIUM: 140 mmol/L (ref 135–145)
TOTAL PROTEIN: 7.4 g/dL (ref 6.5–8.1)

## 2015-02-10 LAB — CBC
HCT: 42.1 % (ref 39.0–52.0)
Hemoglobin: 14.7 g/dL (ref 13.0–17.0)
MCH: 30.1 pg (ref 26.0–34.0)
MCHC: 34.9 g/dL (ref 30.0–36.0)
MCV: 86.3 fL (ref 78.0–100.0)
PLATELETS: 325 10*3/uL (ref 150–400)
RBC: 4.88 MIL/uL (ref 4.22–5.81)
RDW: 14.6 % (ref 11.5–15.5)
WBC: 11.1 10*3/uL — ABNORMAL HIGH (ref 4.0–10.5)

## 2015-02-10 LAB — LIPASE, BLOOD: Lipase: 27 U/L (ref 11–51)

## 2015-02-10 NOTE — ED Notes (Signed)
Per EMS-patient states he has a syringe in his stomach from surgery over a year ago-150 mcg of Fentanyl given in route-

## 2015-02-10 NOTE — Telephone Encounter (Signed)
Patient called requesting an appt. SX- intense sternum pain 10/10 and rectal bleeding. Patient was crying from pain. I offered to call EMS for him. He accepted. Called 911 for patient and gave information accordingly to dispatch to him. Advised him per EMS not to ingest foods or liquids, unlock his door, put up his pet, and keep meds close by where they can be retrieved. He understood and advised that he will await transport.

## 2015-02-10 NOTE — ED Notes (Signed)
Patient states dark stool this am

## 2015-02-12 ENCOUNTER — Other Ambulatory Visit: Payer: Self-pay | Admitting: Internal Medicine

## 2015-02-13 ENCOUNTER — Encounter: Payer: Self-pay | Admitting: Family

## 2015-02-13 ENCOUNTER — Ambulatory Visit (INDEPENDENT_AMBULATORY_CARE_PROVIDER_SITE_OTHER)
Admission: RE | Admit: 2015-02-13 | Discharge: 2015-02-13 | Disposition: A | Discharge status: Home / Self Care | Payer: Medicare Other | Source: Ambulatory Visit | Attending: Family | Admitting: Family

## 2015-02-13 ENCOUNTER — Telehealth: Payer: Self-pay | Admitting: Family

## 2015-02-13 ENCOUNTER — Ambulatory Visit (INDEPENDENT_AMBULATORY_CARE_PROVIDER_SITE_OTHER): Payer: Medicare Other | Admitting: Family

## 2015-02-13 VITALS — BP 148/90 | HR 99 | Temp 98.3°F | Ht 70.0 in | Wt 241.0 lb

## 2015-02-13 DIAGNOSIS — R1033 Periumbilical pain: Secondary | ICD-10-CM | POA: Diagnosis not present

## 2015-02-13 DIAGNOSIS — R1110 Vomiting, unspecified: Secondary | ICD-10-CM | POA: Diagnosis not present

## 2015-02-13 DIAGNOSIS — R1012 Left upper quadrant pain: Secondary | ICD-10-CM | POA: Diagnosis not present

## 2015-02-13 NOTE — Assessment & Plan Note (Signed)
CT scan from September 2015 showed a potential 12 cm linear foreign object with a 0.3 cm diameter located in the stomach. Question origin of current pain. Obtain abdominal x-rays to determine potential status of foreign body.Continue over the counter medications as needed for symptom relief and seek further/ emergency care if symptoms worsen or fail to improve. Consider referral pending x-rays.

## 2015-02-13 NOTE — Telephone Encounter (Signed)
Please inform patient there is no evidence of a foreign object in his stomach that was apparently there during his CT scan. Therefore I would recommend he follow up with either Dr. Alain Marion or GI if he is continuing to have symptoms.

## 2015-02-13 NOTE — Progress Notes (Addendum)
Subjective:    Patient ID: [name], male    DOB: 07/03/1955, 59 y.o.   MRN: [id number]  Chief Complaint  Patient presents with  . Abdominal Pain    HPI:  [name] is a 59 y.o. male who  has a past medical history of ALLERGIC RHINITIS (01/25/2007); ANXIETY DISORDER, GENERALIZED (01/25/2007); DEPRESSION (01/25/2007); ESOPHAGITIS (12/28/2007); GERD (01/25/2007); GLUCOSE INTOLERANCE, HX OF (01/25/2007); HEMORRHOIDS, INTERNAL, WITH BLEEDING (04/26/2008); HYPERLIPIDEMIA (12/27/2007); HYPERTENSION, ESSENTIAL NOS (01/25/2007); Hyperthyroidism (08/11/2010); HYPERTROPHY PROSTATE W/UR OBST & OTH LUTS (02/27/2010); Impotence of organic origin (08/05/2009); LOW BACK PAIN, CHRONIC (11/08/2007); SCHIZOPHRENIA NEC, CHRONIC (01/25/2007); SCOLIOSIS NEC (01/25/2007); Sebaceous cyst (08/10/2010); SMOKER (08/05/2009); UPPER GASTROINTESTINAL HEMORRHAGE (01/25/2007); Rectal abscess; Pancreatitis; Fatty liver (10/09/2010); Hiatal hernia; Heart murmur; and Arthritis. and presents today for an acute office visit.   1.) Stomach pain - Associated symptom of pain located in his stomach has been going on for several years. Notes that the pain is sharp and constant. States that he has a blockage in his stomach and a syringe.  Last bowel movement was last night. Mild nausea with no vomiting recently. Does have a hernia and has ben repaired a couple of times. He has also had a cholesectomy. Did note some blood in stool but believes that it may have been from the laxatives that he used. There was no blood in the most recent bowel movement.   Allergies  Allergen Reactions  . Celecoxib     REACTION: rectal bleeding  . Iohexol     Affects nerves: triggers schizophrenia  . Metoclopramide Hcl     Affects nerves: triggers schizophrenia     Current Outpatient Prescriptions on File Prior to Visit  Medication Sig Dispense Refill  . alum & mag hydroxide-simeth (MAALOX/MYLANTA) 200-200-20 MG/5ML suspension Take 30 mLs by mouth every 6  (six) hours as needed for indigestion or heartburn (for upset stomach).    [name] amLODipine (NORVASC) 10 MG tablet TAKE 1 TABLET BY MOUTH DAILY 30 tablet 2  . benztropine (COGENTIN) 0.5 MG tablet Take 0.5 mg by mouth 2 (two) times daily as needed for tremors.     . calcium carbonate (TUMS - DOSED IN MG ELEMENTAL CALCIUM) 500 MG chewable tablet Chew 2 tablets by mouth daily as needed for indigestion or heartburn (for upset stomach).    . Cholecalciferol (VITAMIN D-3) 1000 UNITS CAPS Take 2,000 Units by mouth daily.    . ergocalciferol (VITAMIN D2) 50000 UNITS capsule Take 1 capsule (50,000 Units total) by mouth once a week. 6 capsule 0  . fluPHENAZine (PROLIXIN) 10 MG tablet Take 10 mg by mouth at bedtime.    [name] KLOR-CON M20 20 MEQ tablet TAKE 1 TABLET (20 MEQ TOTAL) BY MOUTH DAILY. 30 tablet 0  . meloxicam (MOBIC) 15 MG tablet TAKE 1 TABLET (15 MG TOTAL) BY MOUTH DAILY. AS NEEDED FOR PAIN 30 tablet 5  . mupirocin ointment (BACTROBAN) 2 % Applied twice a day to the affected area;NOT into eyes. 22 g 0  . pantoprazole (PROTONIX) 40 MG tablet TAKE 1 TABLET (40 MG TOTAL) BY MOUTH 2 (TWO) TIMES DAILY. 60 tablet 2  . potassium chloride SA (K-DUR,KLOR-CON) 20 MEQ tablet Take 1 tablet (20 mEq total) by mouth daily. 90 tablet 3   No current facility-administered medications on file prior to visit.     Past Surgical History  Procedure Laterality Date  . Excision of thyroid mass    . Abdominal exploration surgery  1988  . Hernia repair  ventral  . Cholecystectomy  05/20/08    Dr. Zella Richer  . Excision of scalp lesion  07/2009  . Shoulder surgery      right  . Ganglion cyst excision      right foot x 2  . Incisional hernia repair  12/01/2011    Procedure: LAPAROSCOPIC INCISIONAL HERNIA;  Surgeon: Harl Bowie, MD;  Location: [facility];  Service: General;  Laterality: N/A;  . Esophagogastroduodenoscopy N/A 12/31/2013    Procedure: ESOPHAGOGASTRODUODENOSCOPY (EGD);  Surgeon: Gatha Mayer, MD;   Location: [facility];  Service: Endoscopy;  Laterality: N/A;  . Colonoscopy  04/23/2010    normal    Review of Systems  Constitutional: Negative for fever and chills.  Gastrointestinal: Positive for nausea and abdominal pain. Negative for vomiting, diarrhea, constipation and blood in stool.      Objective:    BP 148/90 mmHg  Pulse 99  Temp(Src) 98.3 F (36.8 C) (Oral)  Ht 5\' 10"  (1.778 m)  Wt 241 lb (109.317 kg)  BMI 34.58 kg/m2  SpO2 99% Nursing note and vital signs reviewed.  Physical Exam  Constitutional: He is oriented to person, place, and time. He appears well-developed and well-nourished. No distress.  Appears anxious and paranoid as he paces back and forth in the room and stands practically next to provider the entire time while in the room.   Cardiovascular: Normal rate, regular rhythm, normal heart sounds and intact distal pulses.   Pulmonary/Chest: Effort normal and breath sounds normal.  Abdominal: Soft. Normal appearance and bowel sounds are normal. He exhibits no mass. There is no hepatosplenomegaly. There is tenderness. There is no rigidity, no rebound, no guarding, no tenderness at McBurney's point and negative Murphy's sign.  Neurological: He is alert and oriented to person, place, and time.  Skin: Skin is warm and dry.  Psychiatric: His speech is normal and behavior is normal. His mood appears anxious. His affect is labile. Thought content is paranoid. He expresses impulsivity.       Assessment & Plan:   Problem List Items Addressed This Visit      Other   Periumbilical abdominal pain - Primary    CT scan from September 2015 showed a potential 12 cm linear foreign object with a 0.3 cm diameter located in the stomach. Question origin of current pain. Obtain abdominal x-rays to determine potential status of foreign body.Continue over the counter medications as needed for symptom relief and seek further/ emergency care if symptoms worsen or fail to improve.  Consider referral pending x-rays.       Relevant Orders   DG Abd 2 Views (Completed)

## 2015-02-13 NOTE — Patient Instructions (Addendum)
Thank you for choosing [facility].  Summary/Instructions:  Please stop by the lab on the basement level of the building for your blood work. Your results will be released to [place] (or called to you) after review, usually within 72 hours after test completion. If any changes need to be made, you will be notified at that same time.  If your symptoms worsen or fail to improve, please contact our office for further instruction, or in case of emergency go directly to the emergency room at the closest medical facility.    s

## 2015-02-17 NOTE — Telephone Encounter (Signed)
Pt aware of results 

## 2015-04-16 ENCOUNTER — Telehealth: Payer: Self-pay | Admitting: *Deleted

## 2015-04-16 NOTE — Telephone Encounter (Signed)
Notified pt with md response. Pt states he been using otc Aleve & Tylenol Arthritis nothing helping. Made appt to see md 04/22/15 per his request.../lmb

## 2015-04-16 NOTE — Telephone Encounter (Signed)
Use OTC Aleve prn Thx

## 2015-04-16 NOTE — Telephone Encounter (Signed)
Left msg on triage stating requesting md to rx something for this tendinitis in his shoulder...[name]

## 2015-04-17 ENCOUNTER — Other Ambulatory Visit: Payer: Self-pay | Admitting: Internal Medicine

## 2015-04-23 ENCOUNTER — Other Ambulatory Visit: Payer: Self-pay | Admitting: Urology

## 2015-04-23 ENCOUNTER — Ambulatory Visit (INDEPENDENT_AMBULATORY_CARE_PROVIDER_SITE_OTHER): Payer: BLUE CROSS/BLUE SHIELD | Admitting: Internal Medicine

## 2015-04-23 ENCOUNTER — Encounter: Payer: Self-pay | Admitting: Internal Medicine

## 2015-04-23 VITALS — BP 130/88 | HR 98 | Wt 239.0 lb

## 2015-04-23 DIAGNOSIS — I10 Essential (primary) hypertension: Secondary | ICD-10-CM

## 2015-04-23 DIAGNOSIS — R634 Abnormal weight loss: Secondary | ICD-10-CM

## 2015-04-23 DIAGNOSIS — M545 Low back pain: Secondary | ICD-10-CM

## 2015-04-23 DIAGNOSIS — M25511 Pain in right shoulder: Secondary | ICD-10-CM | POA: Diagnosis not present

## 2015-04-23 DIAGNOSIS — M25512 Pain in left shoulder: Secondary | ICD-10-CM

## 2015-04-23 DIAGNOSIS — G8929 Other chronic pain: Secondary | ICD-10-CM

## 2015-04-23 DIAGNOSIS — M25519 Pain in unspecified shoulder: Secondary | ICD-10-CM | POA: Insufficient documentation

## 2015-04-23 DIAGNOSIS — C61 Malignant neoplasm of prostate: Secondary | ICD-10-CM

## 2015-04-23 MED ORDER — MELOXICAM 15 MG PO TABS: ORAL_TABLET | ORAL | Status: DC

## 2015-04-23 NOTE — Assessment & Plan Note (Signed)
Chronic   Amlodipine, spironolactone

## 2015-04-23 NOTE — Progress Notes (Signed)
Pre visit review using our clinic review tool, if applicable. No additional management support is needed unless otherwise documented below in the visit note. 

## 2015-04-23 NOTE — Assessment & Plan Note (Signed)
Wt Readings from Last 3 Encounters:  04/23/15 239 lb (108.41 kg)  02/13/15 241 lb (109.317 kg)  01/31/15 241 lb (109.317 kg)

## 2015-04-23 NOTE — Progress Notes (Signed)
Subjective:  Patient ID: [name], male    DOB: 1955/06/20  Age: 60 y.o. MRN: [id number]  CC: Shoulder Pain   HPI [name] presents for L>>R shoulder pain w/ROM x 1-2 mo - worse w/cold weather. No injury to neck or arms. F/u stomach pain - better. Pt has ran out of Meloxicam. Pt is taking "Red clover" - helps w/everything  Outpatient Prescriptions Prior to Visit  Medication Sig Dispense Refill  . alum & mag hydroxide-simeth (MAALOX/MYLANTA) 200-200-20 MG/5ML suspension Take 30 mLs by mouth every 6 (six) hours as needed for indigestion or heartburn (for upset stomach).    [name] amLODipine (NORVASC) 10 MG tablet TAKE 1 TABLET BY MOUTH DAILY. PHYSICAL DUE IN NOVEMBER. MUST BE SEEN FOR FURTHER REFILLS 30 tablet 1  . benztropine (COGENTIN) 0.5 MG tablet Take 0.5 mg by mouth 2 (two) times daily as needed for tremors.     . calcium carbonate (TUMS - DOSED IN MG ELEMENTAL CALCIUM) 500 MG chewable tablet Chew 2 tablets by mouth daily as needed for indigestion or heartburn (for upset stomach).    . Cholecalciferol (VITAMIN D-3) 1000 UNITS CAPS Take 2,000 Units by mouth daily.    . ergocalciferol (VITAMIN D2) 50000 UNITS capsule Take 1 capsule (50,000 Units total) by mouth once a week. 6 capsule 0  . fluPHENAZine (PROLIXIN) 10 MG tablet Take 10 mg by mouth at bedtime.    [name] KLOR-CON M20 20 MEQ tablet TAKE 1 TABLET (20 MEQ TOTAL) BY MOUTH DAILY. 30 tablet 0  . mupirocin ointment (BACTROBAN) 2 % Applied twice a day to the affected area;NOT into eyes. 22 g 0  . pantoprazole (PROTONIX) 40 MG tablet TAKE 1 TABLET (40 MG TOTAL) BY MOUTH 2 (TWO) TIMES DAILY. 60 tablet 2  . potassium chloride SA (K-DUR,KLOR-CON) 20 MEQ tablet Take 1 tablet (20 mEq total) by mouth daily. 90 tablet 3  . meloxicam (MOBIC) 15 MG tablet TAKE 1 TABLET (15 MG TOTAL) BY MOUTH DAILY. AS NEEDED FOR PAIN 30 tablet 5   No facility-administered medications prior to visit.    ROS Review of Systems  Constitutional: Negative for  appetite change, fatigue and unexpected weight change.  HENT: Negative for congestion, nosebleeds, sneezing, sore throat and trouble swallowing.   Eyes: Negative for itching and visual disturbance.  Respiratory: Negative for cough.   Cardiovascular: Negative for chest pain, palpitations and leg swelling.  Gastrointestinal: Negative for nausea, diarrhea, blood in stool and abdominal distention.  Genitourinary: Negative for urgency, frequency and hematuria.  Musculoskeletal: Positive for back pain, arthralgias and neck stiffness. Negative for joint swelling, gait problem and neck pain.  Skin: Negative for rash.  Neurological: Negative for dizziness, tremors, speech difficulty and weakness.  Psychiatric/Behavioral: Positive for decreased concentration. Negative for suicidal ideas, sleep disturbance, dysphoric mood and agitation. The patient is nervous/anxious.     Objective:  BP 130/88 mmHg  Pulse 98  Wt 239 lb (108.41 kg)  SpO2 96%  BP Readings from Last 3 Encounters:  04/23/15 130/88  02/13/15 148/90  02/10/15 138/81    Wt Readings from Last 3 Encounters:  04/23/15 239 lb (108.41 kg)  02/13/15 241 lb (109.317 kg)  01/31/15 241 lb (109.317 kg)    Physical Exam  Constitutional: He is oriented to person, place, and time. He appears well-developed. No distress.  NAD  HENT:  Mouth/Throat: Oropharynx is clear and moist.  Eyes: Conjunctivae are normal. Pupils are equal, round, and reactive to light.  Neck: Normal range of motion. No  JVD present. No thyromegaly present.  Cardiovascular: Normal rate, regular rhythm, normal heart sounds and intact distal pulses.  Exam reveals no gallop and no friction rub.   No murmur heard. Pulmonary/Chest: Effort normal and breath sounds normal. No respiratory distress. He has no wheezes. He has no rales. He exhibits no tenderness.  Abdominal: Soft. Bowel sounds are normal. He exhibits no distension and no mass. There is no tenderness. There is no  rebound and no guarding.  Musculoskeletal: Normal range of motion. He exhibits tenderness. He exhibits no edema.  Lymphadenopathy:    He has no cervical adenopathy.  Neurological: He is alert and oriented to person, place, and time. He has normal reflexes. No cranial nerve deficit. He exhibits normal muscle tone. He displays a negative Romberg sign. Coordination and gait normal.  Skin: Skin is warm and dry. No rash noted.  Psychiatric: He has a normal mood and affect. His behavior is normal. Judgment and thought content normal.  L > R shoulder is tender w/ROM  Lab Results  Component Value Date   WBC 11.1* 02/10/2015   HGB 14.7 02/10/2015   HCT 42.1 02/10/2015   PLT 325 02/10/2015   GLUCOSE 112* 02/10/2015   CHOL 230* 03/05/2014   TRIG 205.0* 03/05/2014   HDL 40.70 03/05/2014   LDLDIRECT 168.4 03/05/2014   LDLCALC 122* 08/08/2006   ALT 21 02/10/2015   AST 18 02/10/2015   NA 140 02/10/2015   K 3.4* 02/10/2015   CL 102 02/10/2015   CREATININE 1.35* 02/10/2015   BUN 10 02/10/2015   CO2 29 02/10/2015   TSH 1.40 03/05/2014   PSA 5.55* 08/01/2014   INR 1.05 12/31/2013   HGBA1C 5.7 08/01/2014    Dg Abd 2 Views  02/13/2015  CLINICAL DATA:  Left upper quadrant pain vomiting and diarrhea for the past 4-5 days EXAM: ABDOMEN - 2 VIEW COMPARISON:  Abdominal pelvic CT scan of December 31, 2013 FINDINGS: There is a small amount of gas within the small bowel without evidence of nonobstructive pattern. Stool and gas in the colon exhibits a normal pattern. There is a small amount of gas within the stomach. There is no significant rectal gas. There are no abnormal soft tissue calcifications. There are arterial calcifications within the iliac arteries. The lung bases are clear. There is gentle dextrocurvature centered at the T11 level. IMPRESSION: There is no evidence of bowel obstruction, ileus, or gastroenteritis. No acute intra-abdominal abnormality is demonstrated. If there are clinical concerns  of acute diverticulitis or other forms of colitis, abdominal and pelvic CT scanning would be the most useful next imaging step. Electronically Signed   By: David  Martinique M.D.   On: 02/13/2015 13:22    Assessment & Plan:   Pearlie was seen today for shoulder pain.  Diagnoses and all orders for this visit:  Pain of both shoulder joints  Chronic midline low back pain without sciatica  Essential hypertension  Weight loss  Other orders -     meloxicam (MOBIC) 15 MG tablet; Use qd pc prn arthritis   I have discontinued Mr. Gouveia's meloxicam. I am also having him start on meloxicam. Additionally, I am having him maintain his fluPHENAZine, calcium carbonate, alum & mag hydroxide-simeth, benztropine, potassium chloride SA, ergocalciferol, mupirocin ointment, Vitamin D-3, KLOR-CON M20, pantoprazole, amLODipine, and naproxen sodium.  Meds ordered this encounter  Medications  . naproxen sodium (ANAPROX) 220 MG tablet    Sig: Take 220 mg by mouth 2 (two) times daily with a meal.  .  meloxicam (MOBIC) 15 MG tablet    Sig: Use qd pc prn arthritis    Dispense:  30 tablet    Refill:  5     Follow-up: Return in about 3 months (around 07/22/2015) for a follow-up visit.  Walker Kehr, MD

## 2015-04-23 NOTE — Assessment & Plan Note (Signed)
1/17 L>>R MSK Meloxicam prn ROM exercises

## 2015-04-23 NOTE — Assessment & Plan Note (Signed)
Meloxicam, Tramadol prn

## 2015-04-29 ENCOUNTER — Encounter: Payer: Self-pay | Admitting: Gastroenterology

## 2015-05-16 ENCOUNTER — Ambulatory Visit (HOSPITAL_COMMUNITY)
Admission: RE | Admit: 2015-05-16 | Discharge: 2015-05-16 | Disposition: A | Discharge status: Home / Self Care | Payer: BLUE CROSS/BLUE SHIELD | Source: Ambulatory Visit | Attending: Urology | Admitting: Urology

## 2015-05-16 DIAGNOSIS — C61 Malignant neoplasm of prostate: Secondary | ICD-10-CM | POA: Insufficient documentation

## 2015-05-16 DIAGNOSIS — N400 Enlarged prostate without lower urinary tract symptoms: Secondary | ICD-10-CM | POA: Diagnosis not present

## 2015-05-16 LAB — POCT I-STAT CREATININE: CREATININE: 1.3 mg/dL — AB (ref 0.61–1.24)

## 2015-05-16 MED ORDER — GADOBENATE DIMEGLUMINE 529 MG/ML IV SOLN
20.0000 mL | Freq: Once | INTRAVENOUS | Status: AC | PRN
  Administered 2015-05-16: 18 mL via INTRAVENOUS

## 2015-05-20 ENCOUNTER — Ambulatory Visit: Payer: BLUE CROSS/BLUE SHIELD | Admitting: Internal Medicine

## 2015-05-20 ENCOUNTER — Other Ambulatory Visit: Payer: Self-pay | Admitting: Internal Medicine

## 2015-05-25 ENCOUNTER — Encounter (HOSPITAL_COMMUNITY): Payer: Self-pay | Admitting: *Deleted

## 2015-05-25 ENCOUNTER — Emergency Department (HOSPITAL_COMMUNITY)
Admission: EM | Admit: 2015-05-25 | Discharge: 2015-05-25 | Discharge status: Against Medical Advice | Payer: BLUE CROSS/BLUE SHIELD | Attending: Emergency Medicine | Admitting: Emergency Medicine

## 2015-05-25 ENCOUNTER — Emergency Department (INDEPENDENT_AMBULATORY_CARE_PROVIDER_SITE_OTHER)
Admission: EM | Admit: 2015-05-25 | Discharge: 2015-05-25 | Disposition: A | Discharge status: Nursing Home (Includes ICF) | Payer: BLUE CROSS/BLUE SHIELD | Source: Home / Self Care | Attending: Family Medicine | Admitting: Family Medicine

## 2015-05-25 ENCOUNTER — Emergency Department (HOSPITAL_COMMUNITY): Payer: BLUE CROSS/BLUE SHIELD

## 2015-05-25 ENCOUNTER — Encounter (HOSPITAL_COMMUNITY): Payer: Self-pay | Admitting: Family Medicine

## 2015-05-25 DIAGNOSIS — Z872 Personal history of diseases of the skin and subcutaneous tissue: Secondary | ICD-10-CM | POA: Diagnosis not present

## 2015-05-25 DIAGNOSIS — M1990 Unspecified osteoarthritis, unspecified site: Secondary | ICD-10-CM | POA: Diagnosis not present

## 2015-05-25 DIAGNOSIS — R05 Cough: Secondary | ICD-10-CM | POA: Diagnosis not present

## 2015-05-25 DIAGNOSIS — G8929 Other chronic pain: Secondary | ICD-10-CM | POA: Diagnosis not present

## 2015-05-25 DIAGNOSIS — Z791 Long term (current) use of non-steroidal anti-inflammatories (NSAID): Secondary | ICD-10-CM | POA: Insufficient documentation

## 2015-05-25 DIAGNOSIS — K210 Gastro-esophageal reflux disease with esophagitis: Secondary | ICD-10-CM | POA: Insufficient documentation

## 2015-05-25 DIAGNOSIS — F209 Schizophrenia, unspecified: Secondary | ICD-10-CM | POA: Insufficient documentation

## 2015-05-25 DIAGNOSIS — Z79899 Other long term (current) drug therapy: Secondary | ICD-10-CM | POA: Diagnosis not present

## 2015-05-25 DIAGNOSIS — M419 Scoliosis, unspecified: Secondary | ICD-10-CM | POA: Diagnosis not present

## 2015-05-25 DIAGNOSIS — R011 Cardiac murmur, unspecified: Secondary | ICD-10-CM | POA: Diagnosis not present

## 2015-05-25 DIAGNOSIS — R040 Epistaxis: Secondary | ICD-10-CM | POA: Diagnosis not present

## 2015-05-25 DIAGNOSIS — Z8639 Personal history of other endocrine, nutritional and metabolic disease: Secondary | ICD-10-CM | POA: Insufficient documentation

## 2015-05-25 DIAGNOSIS — Z87438 Personal history of other diseases of male genital organs: Secondary | ICD-10-CM | POA: Insufficient documentation

## 2015-05-25 DIAGNOSIS — I10 Essential (primary) hypertension: Secondary | ICD-10-CM | POA: Diagnosis not present

## 2015-05-25 DIAGNOSIS — R0789 Other chest pain: Secondary | ICD-10-CM

## 2015-05-25 DIAGNOSIS — R079 Chest pain, unspecified: Secondary | ICD-10-CM

## 2015-05-25 DIAGNOSIS — F17210 Nicotine dependence, cigarettes, uncomplicated: Secondary | ICD-10-CM | POA: Insufficient documentation

## 2015-05-25 DIAGNOSIS — R062 Wheezing: Secondary | ICD-10-CM | POA: Insufficient documentation

## 2015-05-25 LAB — BASIC METABOLIC PANEL
Anion gap: 12 (ref 5–15)
BUN: 11 mg/dL (ref 6–20)
CHLORIDE: 100 mmol/L — AB (ref 101–111)
CO2: 27 mmol/L (ref 22–32)
CREATININE: 1.24 mg/dL (ref 0.61–1.24)
Calcium: 9.5 mg/dL (ref 8.9–10.3)
GFR calc non Af Amer: >60 mL/min (ref >60)
Glucose, Bld: 98 mg/dL (ref 65–99)
POTASSIUM: 3.4 mmol/L — AB (ref 3.5–5.1)
Sodium: 139 mmol/L (ref 135–145)

## 2015-05-25 LAB — I-STAT TROPONIN, ED: TROPONIN I, POC: 0 ng/mL (ref 0.00–0.08)

## 2015-05-25 LAB — CBC
HCT: 42.8 % (ref 39.0–52.0)
HEMOGLOBIN: 15.2 g/dL (ref 13.0–17.0)
MCH: 30.5 pg (ref 26.0–34.0)
MCHC: 35.5 g/dL (ref 30.0–36.0)
MCV: 85.8 fL (ref 78.0–100.0)
Platelets: 241 10*3/uL (ref 150–400)
RBC: 4.99 MIL/uL (ref 4.22–5.81)
RDW: 14.1 % (ref 11.5–15.5)
WBC: 7 10*3/uL (ref 4.0–10.5)

## 2015-05-25 MED ORDER — IPRATROPIUM-ALBUTEROL 0.5-2.5 (3) MG/3ML IN SOLN
3.0000 mL | Freq: Once | RESPIRATORY_TRACT | Status: AC
  Administered 2015-05-25: 3 mL via RESPIRATORY_TRACT
  Filled 2015-05-25: qty 3

## 2015-05-25 NOTE — ED Notes (Signed)
Phlebotomy entered the room and Pt refused blood work. Pt came out to the desk in a hostile manner stating no one has came in and done anything for him in 3 hrs. Pt states that he wants his "black people medicine and my nerve medicine." Charge RN notified and Bob,RN notified  Current only pending orders are labs.

## 2015-05-25 NOTE — ED Notes (Signed)
Report called to Santiago Glad, ED First Nurse.

## 2015-05-25 NOTE — ED Notes (Addendum)
Pt here for 2 days of nosebleeds in the morning lasting about 30 minutes. No bleeding now. sts also chest has been hurting today. Denies blood thinners. sts he had a touch of the flu last week.

## 2015-05-25 NOTE — ED Provider Notes (Signed)
CSN: [id number]     Arrival date & time 05/25/15  1842 History   First MD Initiated Contact with Patient 05/25/15 1955     Chief Complaint  Patient presents with  . Epistaxis  . Chest Pain     (Consider location/radiation/quality/duration/timing/severity/associated sxs/prior Treatment) HPI Comments: Patient presents to the emergency department with chief complaint of chest tightness. He states that he began to have some chest pain/chest tightness about 5 hours ago today. As any associated difficulty breathing. Denies any radiating symptoms. Denies any diaphoresis. States that he has had a slight cough this week. He thinks that he may have had the flu. He denies any fevers chills. Additionally, patient states that he has had intermittent nosebleeds today. He denies any bleeding now. He is not anticoagulated. He denies any trauma to his nose.  The history is provided by the patient. No language interpreter was used.    Past Medical History  Diagnosis Date  . ALLERGIC RHINITIS 01/25/2007  . ANXIETY DISORDER, GENERALIZED 01/25/2007  . DEPRESSION 01/25/2007  . ESOPHAGITIS 12/28/2007  . GERD 01/25/2007  . GLUCOSE INTOLERANCE, HX OF 01/25/2007  . HEMORRHOIDS, INTERNAL, WITH BLEEDING 04/26/2008  . HYPERLIPIDEMIA 12/27/2007  . HYPERTENSION, ESSENTIAL NOS 01/25/2007  . Hyperthyroidism 08/11/2010  . HYPERTROPHY PROSTATE W/UR OBST & OTH LUTS 02/27/2010  . 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 LOW BACK PAIN, CHRONIC 11/08/2007  . 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 SCOLIOSIS NEC 01/25/2007  . Sebaceous cyst 08/10/2010  . SMOKER 08/05/2009  . UPPER GASTROINTESTINAL HEMORRHAGE 01/25/2007  . Rectal abscess   . Pancreatitis   . Fatty liver 10/09/2010  . Hiatal hernia   . Heart murmur     hx of   . Arthritis    Past Surgical History  Procedure Laterality Date  . Excision of thyroid mass    . Abdominal exploration surgery  1988  . Hernia repair      ventral  . Cholecystectomy  05/20/08    Dr.  Zella Richer  . Excision of scalp lesion  07/2009  . Shoulder surgery      right  . Ganglion cyst excision      right foot x 2  . Incisional hernia repair  12/01/2011    Procedure: LAPAROSCOPIC INCISIONAL HERNIA;  Surgeon: Harl Bowie, MD;  Location: [facility];  Service: General;  Laterality: N/A;  . Esophagogastroduodenoscopy N/A 12/31/2013    Procedure: ESOPHAGOGASTRODUODENOSCOPY (EGD);  Surgeon: Gatha Mayer, MD;  Location: [facility];  Service: Endoscopy;  Laterality: N/A;  . Colonoscopy  04/23/2010    normal   Family History  Problem Relation Age of Onset  . Hypertension Father   . Hypertension Mother   . Prostate cancer Other   . Colon cancer Neg Hx    Social History  Substance Use Topics  . Smoking status: Current Every Day Smoker -- 1.50 packs/day for 30 years    Types: Cigarettes  . Smokeless tobacco: None  . Alcohol Use: No    Review of Systems  Constitutional: Negative for fever and chills.  HENT: Positive for nosebleeds.   Respiratory: Positive for chest tightness. Negative for shortness of breath.   Cardiovascular: Negative for chest pain.  Gastrointestinal: Negative for nausea, vomiting, diarrhea and constipation.  Genitourinary: Negative for dysuria.  All other systems reviewed and are negative.     Allergies  Celecoxib; Iohexol; and Metoclopramide hcl  Home Medications   Prior to Admission medications   Medication Sig Start Date End Date Taking? Authorizing  Provider  alum & mag hydroxide-simeth (MAALOX/MYLANTA) 200-200-20 MG/5ML suspension Take 30 mLs by mouth every 6 (six) hours as needed for indigestion or heartburn (for upset stomach).    Historical Provider, MD  amLODipine (NORVASC) 10 MG tablet TAKE 1 TABLET BY MOUTH DAILY. PHYSICAL DUE IN NOVEMBER. MUST BE SEEN FOR FURTHER REFILLS 04/17/15   Lew Dawes V, MD  benztropine (COGENTIN) 0.5 MG tablet Take 0.5 mg by mouth 2 (two) times daily as needed for tremors.  12/19/13   Historical Provider,  MD  calcium carbonate (TUMS - DOSED IN MG ELEMENTAL CALCIUM) 500 MG chewable tablet Chew 2 tablets by mouth daily as needed for indigestion or heartburn (for upset stomach).    Historical Provider, MD  Cholecalciferol (VITAMIN D-3) 1000 UNITS CAPS Take 2,000 Units by mouth daily.    Historical Provider, MD  ergocalciferol (VITAMIN D2) 50000 UNITS capsule Take 1 capsule (50,000 Units total) by mouth once a week. 03/07/14   Aleksei Plotnikov V, MD  fluPHENAZine (PROLIXIN) 10 MG tablet Take 10 mg by mouth at bedtime.    Historical Provider, MD  meloxicam (MOBIC) 15 MG tablet Use qd pc prn arthritis 04/23/15   Aleksei Plotnikov V, MD  mupirocin ointment (BACTROBAN) 2 % Applied twice a day to the affected area;NOT into eyes. 08/01/14   Hendricks Limes, MD  naproxen sodium (ANAPROX) 220 MG tablet Take 220 mg by mouth 2 (two) times daily with a meal.    Historical Provider, MD  pantoprazole (PROTONIX) 40 MG tablet TAKE 1 TABLET (40 MG TOTAL) BY MOUTH 2 (TWO) TIMES DAILY. 02/12/15   Aleksei Plotnikov V, MD  potassium chloride SA (K-DUR,KLOR-CON) 20 MEQ tablet Take 1 tablet (20 mEq total) by mouth daily. 02/18/14   Aleksei Plotnikov V, MD  potassium chloride SA (KLOR-CON M20) 20 MEQ tablet Take 1 tablet (20 mEq total) by mouth daily. ---patient needs office visit before any further refills 05/20/15   Aleksei Plotnikov V, MD   BP 119/73 mmHg  Pulse 79  Temp(Src) 98.1 F (36.7 C) (Oral)  Resp 21  SpO2 93% Physical Exam  Constitutional: He is oriented to person, place, and time. He appears well-developed and well-nourished.  HENT:  Head: Normocephalic and atraumatic.  Dried blood in right nare, no active bleeding  Eyes: Conjunctivae and EOM are normal. Pupils are equal, round, and reactive to light. Right eye exhibits no discharge. Left eye exhibits no discharge. No scleral icterus.  Neck: Normal range of motion. Neck supple. No JVD present.  Cardiovascular: Normal rate, regular rhythm and normal heart  sounds.  Exam reveals no gallop and no friction rub.   No murmur heard. Pulmonary/Chest: Effort normal. No respiratory distress. He has wheezes. He has no rales. He exhibits no tenderness.  Mild bilateral lower lobe wheezes  Abdominal: Soft. He exhibits no distension and no mass. There is no tenderness. There is no rebound and no guarding.  Musculoskeletal: Normal range of motion. He exhibits no edema or tenderness.  Neurological: He is alert and oriented to person, place, and time.  Skin: Skin is warm and dry.  Psychiatric: He has a normal mood and affect. His behavior is normal. Judgment and thought content normal.  Nursing note and vitals reviewed.   ED Course  Procedures (including critical care time) Results for orders placed or performed during the hospital encounter of 0000000  Basic metabolic panel  Result Value Ref Range   Sodium 139 135 - 145 mmol/L   Potassium 3.4 (L) 3.5 - 5.1  mmol/L   Chloride 100 (L) 101 - 111 mmol/L   CO2 27 22 - 32 mmol/L   Glucose, Bld 98 65 - 99 mg/dL   BUN 11 6 - 20 mg/dL   Creatinine, Ser 1.24 0.61 - 1.24 mg/dL   Calcium 9.5 8.9 - 10.3 mg/dL   GFR calc non Af Amer >60 >60 mL/min   GFR calc Af Amer >60 >60 mL/min   Anion gap 12 5 - 15  CBC  Result Value Ref Range   WBC 7.0 4.0 - 10.5 K/uL   RBC 4.99 4.22 - 5.81 MIL/uL   Hemoglobin 15.2 13.0 - 17.0 g/dL   HCT 42.8 39.0 - 52.0 %   MCV 85.8 78.0 - 100.0 fL   MCH 30.5 26.0 - 34.0 pg   MCHC 35.5 30.0 - 36.0 g/dL   RDW 14.1 11.5 - 15.5 %   Platelets 241 150 - 400 K/uL  I-stat troponin, ED (not at [facility])  Result Value Ref Range   Troponin i, poc 0.00 0.00 - 0.08 ng/mL   Comment 3           Dg Chest 2 View  05/25/2015  CLINICAL DATA:  Per patient X 1 week he has been feeling nauseated, and has had a dry cough. Centralized chest pain X 1 day, nose bleed X 3 days. HX EXAM: CHEST  2 VIEW COMPARISON:  12/30/2013 FINDINGS: Heart size is normal. Lungs are mildly hyperinflated. There are no focal  consolidations or pleural effusions. No pulmonary edema. Surgical clips are noted in the upper abdomen. Surgical clips are noted in the superior mediastinum. No suspicious lytic or blastic lesions are identified. IMPRESSION: No evidence for acute cardiopulmonary abnormality. Electronically Signed   By: Nolon Nations M.D.   On: 05/25/2015 19:26   Mr Prostate W / Wo Cm  05/16/2015  CLINICAL DATA:  Prostate cancer. EXAM: MR PROSTATE WITHOUT AND WITH CONTRAST TECHNIQUE: Multiplanar multisequence MRI images were obtained of the pelvis centered about the prostate. Pre and post contrast images were obtained. CONTRAST:  33mL MULTIHANCE GADOBENATE DIMEGLUMINE 529 MG/ML IV SOLN COMPARISON:  Abdominal pelvic CT of 12/31/2013. No prior MRI. Biopsy results from 10/16/2014. FINDINGS: Prostate: Motion degradation, especially involving the high-resolution T2 weighted images. This occurred despite attempt at repeat axial T2 these using a fast sequence. Moderate benign prostatic hyperplasia with central gland enlargement heterogeneity, including a left paracentral median lobe indenting the urinary bladder on image 13/series 6. Equivocal T2 hypo intensity involving the lateral right mid to apical gland on image 22/series 6 and sagittal image 10/series 8. No other areas of focal T2 hypo intensity. Minimal T1 hyperintensity prior to contrast within the mid gland on image 22/series 7 is likely biopsy induced hemorrhage. No convincing evidence of restricted diffusion or early post-contrast enhancement. Transcapsular spread:  Absent Seminal vesicle involvement: Absent Neurovascular bundle involvement: Absent Pelvic adenopathy: Absent Bone metastasis: Absent Other findings: No significant free fluid.  Normal urinary bladder. IMPRESSION: 1. Motion degraded exam, as detailed above. 2. No evidence of high-grade or large volume carcinoma. An equivocal area of right lateral mid to apical gland T2 hypo intensity is without diffusion or  post-contrast correlate. 3.  No evidence of locally advanced or pelvic metastatic disease. 4. Moderate benign prostatic hyperplasia. Electronically Signed   By: Abigail Miyamoto M.D.   On: 05/16/2015 11:38     Imaging Review Dg Chest 2 View  05/25/2015  CLINICAL DATA:  Per patient X 1 week he has been feeling  nauseated, and has had a dry cough. Centralized chest pain X 1 day, nose bleed X 3 days. HX EXAM: CHEST  2 VIEW COMPARISON:  12/30/2013 FINDINGS: Heart size is normal. Lungs are mildly hyperinflated. There are no focal consolidations or pleural effusions. No pulmonary edema. Surgical clips are noted in the upper abdomen. Surgical clips are noted in the superior mediastinum. No suspicious lytic or blastic lesions are identified. IMPRESSION: No evidence for acute cardiopulmonary abnormality. Electronically Signed   By: Nolon Nations M.D.   On: 05/25/2015 19:26   I have personally reviewed and evaluated these images and lab results as part of my medical decision-making.  ED ECG REPORT  I personally interpreted this EKG   Date: 05/25/2015   Rate: 80  Rhythm: normal sinus rhythm  QRS Axis: normal  Intervals: normal  ST/T Wave abnormalities: normal  Conduction Disutrbances:none  Narrative Interpretation:   Old EKG Reviewed: none available    MDM   Final diagnoses:  Chest pain, unspecified chest pain type  Epistaxis    Patient with chest tightness and nosebleed. Bleeding is controlled. I suspect this is from dry mucous membranes. Will instruct the patient to use nasal saline sprays. Patient does have some mild bilateral wheezing on exam. Will give the patient breathing treatment. He is afebrile. His chest x-ray is clear. His labs and vital signs are stable. No evidence of infection or leukocytosis, negative troponin.  11:20 PM Patient eloped.  Nursing staff caught patient in waiting room on his way out and attempted to get final troponin, but patient refused this.     Montine Circle,  PA-C 05/25/15 2325  Charlesetta Shanks, MD 05/25/15 [phone]

## 2015-05-25 NOTE — ED Provider Notes (Signed)
CSN: [id number]     Arrival date & time 05/25/15  1755 History   First MD Initiated Contact with Patient 05/25/15 1812     Chief Complaint  Patient presents with  . Epistaxis  . Chest Pain   (Consider location/radiation/quality/duration/timing/severity/associated sxs/prior Treatment) Patient is a 60 y.o. male presenting with nosebleeds and chest pain. The history is provided by the patient.  Epistaxis Location:  R nare Severity:  Moderate Duration:  2 days Timing:  Intermittent Progression:  Worsening Chronicity:  New Relieved by:  None tried Worsened by:  Nothing tried Ineffective treatments:  None tried Associated symptoms: no fever   Risk factors: alcohol use   Chest Pain Pain location:  L chest Pain quality: burning   Pain radiates to:  Does not radiate Relieved by:  None tried Worsened by:  Nothing tried Ineffective treatments:  None tried Associated symptoms: abdominal pain   Associated symptoms: no fever and no palpitations     Past Medical History  Diagnosis Date  . ALLERGIC RHINITIS 01/25/2007  . ANXIETY DISORDER, GENERALIZED 01/25/2007  . DEPRESSION 01/25/2007  . ESOPHAGITIS 12/28/2007  . GERD 01/25/2007  . GLUCOSE INTOLERANCE, HX OF 01/25/2007  . HEMORRHOIDS, INTERNAL, WITH BLEEDING 04/26/2008  . HYPERLIPIDEMIA 12/27/2007  . HYPERTENSION, ESSENTIAL NOS 01/25/2007  . Hyperthyroidism 08/11/2010  . HYPERTROPHY PROSTATE W/UR OBST & OTH LUTS 02/27/2010  . 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 LOW BACK PAIN, CHRONIC 11/08/2007  . 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 SCOLIOSIS NEC 01/25/2007  . Sebaceous cyst 08/10/2010  . SMOKER 08/05/2009  . UPPER GASTROINTESTINAL HEMORRHAGE 01/25/2007  . Rectal abscess   . Pancreatitis   . Fatty liver 10/09/2010  . Hiatal hernia   . Heart murmur     hx of   . Arthritis    Past Surgical History  Procedure Laterality Date  . Excision of thyroid mass    . Abdominal exploration surgery  1988  . Hernia repair      ventral  .  Cholecystectomy  05/20/08    Dr. Zella Richer  . Excision of scalp lesion  07/2009  . Shoulder surgery      right  . Ganglion cyst excision      right foot x 2  . Incisional hernia repair  12/01/2011    Procedure: LAPAROSCOPIC INCISIONAL HERNIA;  Surgeon: Harl Bowie, MD;  Location: [facility];  Service: General;  Laterality: N/A;  . Esophagogastroduodenoscopy N/A 12/31/2013    Procedure: ESOPHAGOGASTRODUODENOSCOPY (EGD);  Surgeon: Gatha Mayer, MD;  Location: [facility];  Service: Endoscopy;  Laterality: N/A;  . Colonoscopy  04/23/2010    normal   Family History  Problem Relation Age of Onset  . Hypertension Father   . Hypertension Mother   . Prostate cancer Other   . Colon cancer Neg Hx    Social History  Substance Use Topics  . Smoking status: Current Every Day Smoker -- 1.50 packs/day for 30 years    Types: Cigarettes  . Smokeless tobacco: None  . Alcohol Use: No    Review of Systems  Constitutional: Negative for fever.  HENT: Positive for nosebleeds.   Respiratory: Negative.   Cardiovascular: Positive for chest pain. Negative for palpitations and leg swelling.  Gastrointestinal: Positive for abdominal pain.    Allergies  Celecoxib; Iohexol; and Metoclopramide hcl  Home Medications   Prior to Admission medications   Medication Sig Start Date End Date Taking? Authorizing Provider  amLODipine (NORVASC) 10 MG tablet TAKE 1 TABLET BY MOUTH  DAILY. PHYSICAL DUE IN NOVEMBER. MUST BE SEEN FOR FURTHER REFILLS 04/17/15  Yes Aleksei Plotnikov V, MD  calcium carbonate (TUMS - DOSED IN MG ELEMENTAL CALCIUM) 500 MG chewable tablet Chew 2 tablets by mouth daily as needed for indigestion or heartburn (for upset stomach).   Yes Historical Provider, MD  Cholecalciferol (VITAMIN D-3) 1000 UNITS CAPS Take 2,000 Units by mouth daily.   Yes Historical Provider, MD  ergocalciferol (VITAMIN D2) 50000 UNITS capsule Take 1 capsule (50,000 Units total) by mouth once a week. 03/07/14  Yes Aleksei  Plotnikov V, MD  fluPHENAZine (PROLIXIN) 10 MG tablet Take 10 mg by mouth at bedtime.   Yes Historical Provider, MD  meloxicam (MOBIC) 15 MG tablet Use qd pc prn arthritis 04/23/15  Yes Aleksei Plotnikov V, MD  pantoprazole (PROTONIX) 40 MG tablet TAKE 1 TABLET (40 MG TOTAL) BY MOUTH 2 (TWO) TIMES DAILY. 02/12/15  Yes Aleksei Plotnikov V, MD  potassium chloride SA (KLOR-CON M20) 20 MEQ tablet Take 1 tablet (20 mEq total) by mouth daily. ---patient needs office visit before any further refills 05/20/15  Yes Aleksei Plotnikov V, MD  alum & mag hydroxide-simeth (MAALOX/MYLANTA) 200-200-20 MG/5ML suspension Take 30 mLs by mouth every 6 (six) hours as needed for indigestion or heartburn (for upset stomach).    Historical Provider, MD  benztropine (COGENTIN) 0.5 MG tablet Take 0.5 mg by mouth 2 (two) times daily as needed for tremors.  12/19/13   Historical Provider, MD  mupirocin ointment (BACTROBAN) 2 % Applied twice a day to the affected area;NOT into eyes. 08/01/14   Hendricks Limes, MD  naproxen sodium (ANAPROX) 220 MG tablet Take 220 mg by mouth 2 (two) times daily with a meal.    Historical Provider, MD  potassium chloride SA (K-DUR,KLOR-CON) 20 MEQ tablet Take 1 tablet (20 mEq total) by mouth daily. 02/18/14   Cassandria Anger, MD   Meds Ordered and Administered this Visit  Medications - No data to display  BP 139/90 mmHg  Pulse 95  Temp(Src) 98.3 F (36.8 C) (Oral)  Resp 26  SpO2 97% No data found.   Physical Exam  Constitutional: He appears well-developed and well-nourished.  HENT:  Right Ear: External ear normal.  Left Ear: External ear normal.  Nose: Nose normal.  Mouth/Throat: Oropharynx is clear and moist.  Neck: Normal range of motion. Neck supple.  Cardiovascular: Normal rate, regular rhythm, normal heart sounds and intact distal pulses.   Pulmonary/Chest: Effort normal and breath sounds normal.  Skin: Skin is warm and dry.  Nursing note and vitals reviewed.   ED Course   Procedures (including critical care time)  Labs Review Labs Reviewed - No data to display  Imaging Review No results found.   Visual Acuity Review  Right Eye Distance:   Left Eye Distance:   Bilateral Distance:    Right Eye Near:   Left Eye Near:    Bilateral Near:     ED ECG REPORT   Date: 05/25/2015  Rate: 79  Rhythm: normal sinus rhythm  QRS Axis: normal  Intervals: normal  ST/T Wave abnormalities: nonspecific T wave changes  Conduction Disutrbances:none  Narrative Interpretation: no acute changes.  Old EKG Reviewed: none available  I have personally reviewed the EKG tracing and agree with the computerized printout as noted.     MDM   1. Epistaxis, recurrent   2. Chest pain, atypical    Sent for eval of recurrent epistaxis and chest pain.    Nelda Severe  Juventino Slovak, MD 05/25/15 [id number]

## 2015-05-25 NOTE — ED Notes (Addendum)
Reports having had the flu over the past week.  Over last 2 days has been having "severe" episodes of epistaxis - most recent just PTA.  C/O dizziness and weakness.  C/O some mild mid chest pain intermittently today that "feels more like gas".  Also c/o cough x 5 days.

## 2015-06-04 ENCOUNTER — Ambulatory Visit (INDEPENDENT_AMBULATORY_CARE_PROVIDER_SITE_OTHER): Payer: BLUE CROSS/BLUE SHIELD | Admitting: Internal Medicine

## 2015-06-04 ENCOUNTER — Encounter: Payer: Self-pay | Admitting: Internal Medicine

## 2015-06-04 VITALS — BP 128/80 | HR 96 | Wt 239.0 lb

## 2015-06-04 DIAGNOSIS — F17200 Nicotine dependence, unspecified, uncomplicated: Secondary | ICD-10-CM

## 2015-06-04 DIAGNOSIS — R7302 Impaired glucose tolerance (oral): Secondary | ICD-10-CM

## 2015-06-04 DIAGNOSIS — K2100 Gastro-esophageal reflux disease with esophagitis, without bleeding: Secondary | ICD-10-CM

## 2015-06-04 DIAGNOSIS — M545 Low back pain, unspecified: Secondary | ICD-10-CM

## 2015-06-04 DIAGNOSIS — G8929 Other chronic pain: Secondary | ICD-10-CM

## 2015-06-04 NOTE — Progress Notes (Signed)
Subjective:  Patient ID: [name], male    DOB: [birth year]  Age: 60 y.o. MRN: [id number]  CC: No chief complaint on file.   HPI [name] presents for HTN, OA. He had a cold x 1 wk that has "purifed his blood". He wants to quit smoking 1.5 ppd (not inhaling) Pall Mall menthol light.  Outpatient Prescriptions Prior to Visit  Medication Sig Dispense Refill  . alum & mag hydroxide-simeth (MAALOX/MYLANTA) 200-200-20 MG/5ML suspension Take 30 mLs by mouth every 6 (six) hours as needed for indigestion or heartburn (for upset stomach).    [name] amLODipine (NORVASC) 10 MG tablet TAKE 1 TABLET BY MOUTH DAILY. PHYSICAL DUE IN NOVEMBER. MUST BE SEEN FOR FURTHER REFILLS 30 tablet 1  . calcium carbonate (TUMS - DOSED IN MG ELEMENTAL CALCIUM) 500 MG chewable tablet Chew 2 tablets by mouth daily as needed for indigestion or heartburn (for upset stomach).    . Cholecalciferol (VITAMIN D-3) 1000 UNITS CAPS Take 2,000 Units by mouth daily.    . ergocalciferol (VITAMIN D2) 50000 UNITS capsule Take 1 capsule (50,000 Units total) by mouth once a week. 6 capsule 0  . fluPHENAZine (PROLIXIN) 10 MG tablet Take 10 mg by mouth at bedtime.    . meloxicam (MOBIC) 15 MG tablet Use qd pc prn arthritis (Patient taking differently: Take 15 mg by mouth daily. ) 30 tablet 5  . mupirocin ointment (BACTROBAN) 2 % Applied twice a day to the affected area;NOT into eyes. 22 g 0  . pantoprazole (PROTONIX) 40 MG tablet TAKE 1 TABLET (40 MG TOTAL) BY MOUTH 2 (TWO) TIMES DAILY. 60 tablet 2  . potassium chloride SA (K-DUR,KLOR-CON) 20 MEQ tablet Take 1 tablet (20 mEq total) by mouth daily. 90 tablet 3  . potassium chloride SA (KLOR-CON M20) 20 MEQ tablet Take 1 tablet (20 mEq total) by mouth daily. ---patient needs office visit before any further refills 30 tablet 0  . levofloxacin (LEVAQUIN) 750 MG tablet Take 750 mg by mouth See admin instructions. Take 1 tablet (750 mg) by mouth on the day before biopsy, the day of the  biopsy before coming to the office and the day after the biopsy  0  . naproxen sodium (ALEVE) 220 MG tablet Take 220-440 mg by mouth 2 (two) times daily with a meal.     No facility-administered medications prior to visit.    ROS Review of Systems  Constitutional: Negative for appetite change, fatigue and unexpected weight change.  HENT: Negative for congestion, nosebleeds, sneezing, sore throat and trouble swallowing.   Eyes: Negative for itching and visual disturbance.  Respiratory: Negative for cough.   Cardiovascular: Negative for chest pain, palpitations and leg swelling.  Gastrointestinal: Negative for nausea, diarrhea, blood in stool and abdominal distention.  Genitourinary: Negative for frequency and hematuria.  Musculoskeletal: Negative for back pain, joint swelling, gait problem and neck pain.  Skin: Negative for rash.  Neurological: Negative for dizziness, tremors, speech difficulty and weakness.  Psychiatric/Behavioral: Negative for suicidal ideas, sleep disturbance, dysphoric mood and agitation. The patient is nervous/anxious.     Objective:  BP 128/80 mmHg  Pulse 96  Wt 239 lb (108.41 kg)  SpO2 98%  BP Readings from Last 3 Encounters:  06/04/15 128/80  05/25/15 138/95  05/25/15 139/90    Wt Readings from Last 3 Encounters:  06/04/15 239 lb (108.41 kg)  04/23/15 239 lb (108.41 kg)  02/13/15 241 lb (109.317 kg)    Physical Exam  Constitutional: He is oriented to  person, place, and time. He appears well-developed. No distress.  NAD  HENT:  Mouth/Throat: Oropharynx is clear and moist.  Eyes: Conjunctivae are normal. Pupils are equal, round, and reactive to light.  Neck: Normal range of motion. No JVD present. No thyromegaly present.  Cardiovascular: Normal rate, regular rhythm, normal heart sounds and intact distal pulses.  Exam reveals no gallop and no friction rub.   No murmur heard. Pulmonary/Chest: Effort normal and breath sounds normal. No respiratory  distress. He has no wheezes. He has no rales. He exhibits no tenderness.  Abdominal: Soft. Bowel sounds are normal. He exhibits no distension and no mass. There is no tenderness. There is no rebound and no guarding.  Musculoskeletal: Normal range of motion. He exhibits tenderness. He exhibits no edema.  Lymphadenopathy:    He has no cervical adenopathy.  Neurological: He is alert and oriented to person, place, and time. He has normal reflexes. No cranial nerve deficit. He exhibits normal muscle tone. He displays a negative Romberg sign. Coordination and gait normal.  Skin: Skin is warm and dry. No rash noted.  Psychiatric: He has a normal mood and affect. His behavior is normal. Judgment and thought content normal.  neck, LS - stiff talkative  Lab Results  Component Value Date   WBC 7.0 05/25/2015   HGB 15.2 05/25/2015   HCT 42.8 05/25/2015   PLT 241 05/25/2015   GLUCOSE 98 05/25/2015   CHOL 230* 03/05/2014   TRIG 205.0* 03/05/2014   HDL 40.70 03/05/2014   LDLDIRECT 168.4 03/05/2014   LDLCALC 122* 08/08/2006   ALT 21 02/10/2015   AST 18 02/10/2015   NA 139 05/25/2015   K 3.4* 05/25/2015   CL 100* 05/25/2015   CREATININE 1.24 05/25/2015   BUN 11 05/25/2015   CO2 27 05/25/2015   TSH 1.40 03/05/2014   PSA 5.55* 08/01/2014   INR 1.05 12/31/2013   HGBA1C 5.7 08/01/2014    Dg Chest 2 View  05/25/2015  CLINICAL DATA:  Per patient X 1 week he has been feeling nauseated, and has had a dry cough. Centralized chest pain X 1 day, nose bleed X 3 days. HX EXAM: CHEST  2 VIEW COMPARISON:  12/30/2013 FINDINGS: Heart size is normal. Lungs are mildly hyperinflated. There are no focal consolidations or pleural effusions. No pulmonary edema. Surgical clips are noted in the upper abdomen. Surgical clips are noted in the superior mediastinum. No suspicious lytic or blastic lesions are identified. IMPRESSION: No evidence for acute cardiopulmonary abnormality. Electronically Signed   By: Nolon Nations M.D.   On: 05/25/2015 19:26    Assessment & Plan:   Diagnoses and all orders for this visit:  SMOKER  Chronic midline low back pain without sciatica  Impaired glucose tolerance  Reflux esophagitis  I have discontinued Mr. Nader's levofloxacin, naproxen sodium, and levofloxacin. I am also having him maintain his fluPHENAZine, calcium carbonate, alum & mag hydroxide-simeth, potassium chloride SA, ergocalciferol, mupirocin ointment, Vitamin D-3, pantoprazole, amLODipine, meloxicam, and potassium chloride SA.  Meds ordered this encounter  Medications  . DISCONTD: levofloxacin (LEVAQUIN) 500 MG tablet    Sig: Reported on 06/04/2015    Refill:  0     Follow-up: Return in about 4 months (around 10/02/2015) for a follow-up visit.  Walker Kehr, MD

## 2015-06-04 NOTE — Assessment & Plan Note (Signed)
Prolixin

## 2015-06-04 NOTE — Assessment & Plan Note (Signed)
He wants to quit smoking 1.5 ppd (not inhaling) Pall Mall menthol light. Options to help quit discussed.

## 2015-06-04 NOTE — Assessment & Plan Note (Signed)
Labs

## 2015-06-04 NOTE — Progress Notes (Signed)
Pre visit review using our clinic review tool, if applicable. No additional management support is needed unless otherwise documented below in the visit note. 

## 2015-06-04 NOTE — Assessment & Plan Note (Signed)
Protonix po 

## 2015-06-10 ENCOUNTER — Other Ambulatory Visit: Payer: Self-pay | Admitting: Internal Medicine

## 2015-06-17 DIAGNOSIS — Z0000 Encounter for general adult medical examination without abnormal findings: Secondary | ICD-10-CM | POA: Diagnosis not present

## 2015-06-17 DIAGNOSIS — R8271 Bacteriuria: Secondary | ICD-10-CM | POA: Diagnosis not present

## 2015-06-17 DIAGNOSIS — C61 Malignant neoplasm of prostate: Secondary | ICD-10-CM | POA: Diagnosis not present

## 2015-06-21 ENCOUNTER — Other Ambulatory Visit: Payer: Self-pay | Admitting: Internal Medicine

## 2015-06-30 DIAGNOSIS — R1013 Epigastric pain: Secondary | ICD-10-CM | POA: Diagnosis not present

## 2015-07-02 ENCOUNTER — Other Ambulatory Visit: Payer: Self-pay | Admitting: Internal Medicine

## 2015-07-02 DIAGNOSIS — J439 Emphysema, unspecified: Secondary | ICD-10-CM | POA: Diagnosis not present

## 2015-07-02 DIAGNOSIS — G5680 Other specified mononeuropathies of unspecified upper limb: Secondary | ICD-10-CM | POA: Diagnosis not present

## 2015-07-02 DIAGNOSIS — K219 Gastro-esophageal reflux disease without esophagitis: Secondary | ICD-10-CM | POA: Diagnosis not present

## 2015-07-02 DIAGNOSIS — K439 Ventral hernia without obstruction or gangrene: Secondary | ICD-10-CM | POA: Diagnosis not present

## 2015-07-15 DIAGNOSIS — R208 Other disturbances of skin sensation: Secondary | ICD-10-CM | POA: Diagnosis not present

## 2015-09-10 ENCOUNTER — Emergency Department (HOSPITAL_COMMUNITY): Payer: BLUE CROSS/BLUE SHIELD

## 2015-09-10 ENCOUNTER — Emergency Department (HOSPITAL_COMMUNITY)
Admission: EM | Admit: 2015-09-10 | Discharge: 2015-09-11 | Disposition: A | Discharge status: Home / Self Care | Payer: BLUE CROSS/BLUE SHIELD | Attending: Emergency Medicine | Admitting: Emergency Medicine

## 2015-09-10 ENCOUNTER — Encounter (HOSPITAL_COMMUNITY): Payer: Self-pay | Admitting: *Deleted

## 2015-09-10 DIAGNOSIS — M1990 Unspecified osteoarthritis, unspecified site: Secondary | ICD-10-CM | POA: Insufficient documentation

## 2015-09-10 DIAGNOSIS — I10 Essential (primary) hypertension: Secondary | ICD-10-CM | POA: Insufficient documentation

## 2015-09-10 DIAGNOSIS — G8929 Other chronic pain: Secondary | ICD-10-CM | POA: Insufficient documentation

## 2015-09-10 DIAGNOSIS — M545 Low back pain: Secondary | ICD-10-CM | POA: Insufficient documentation

## 2015-09-10 DIAGNOSIS — Z79899 Other long term (current) drug therapy: Secondary | ICD-10-CM | POA: Insufficient documentation

## 2015-09-10 DIAGNOSIS — E876 Hypokalemia: Secondary | ICD-10-CM | POA: Insufficient documentation

## 2015-09-10 DIAGNOSIS — Z791 Long term (current) use of non-steroidal anti-inflammatories (NSAID): Secondary | ICD-10-CM | POA: Insufficient documentation

## 2015-09-10 DIAGNOSIS — F17210 Nicotine dependence, cigarettes, uncomplicated: Secondary | ICD-10-CM | POA: Diagnosis not present

## 2015-09-10 DIAGNOSIS — K429 Umbilical hernia without obstruction or gangrene: Secondary | ICD-10-CM

## 2015-09-10 DIAGNOSIS — F329 Major depressive disorder, single episode, unspecified: Secondary | ICD-10-CM | POA: Insufficient documentation

## 2015-09-10 DIAGNOSIS — R1084 Generalized abdominal pain: Secondary | ICD-10-CM | POA: Insufficient documentation

## 2015-09-10 LAB — COMPREHENSIVE METABOLIC PANEL
ALBUMIN: 4.3 g/dL (ref 3.5–5.0)
ALK PHOS: 77 U/L (ref 38–126)
ALT: 18 U/L (ref 17–63)
AST: 20 U/L (ref 15–41)
Anion gap: 9 (ref 5–15)
BILIRUBIN TOTAL: 0.5 mg/dL (ref 0.3–1.2)
BUN: 10 mg/dL (ref 6–20)
CALCIUM: 9.4 mg/dL (ref 8.9–10.3)
CO2: 27 mmol/L (ref 22–32)
CREATININE: 1.48 mg/dL — AB (ref 0.61–1.24)
Chloride: 101 mmol/L (ref 101–111)
GFR calc Af Amer: 58 mL/min — ABNORMAL LOW (ref >60)
GFR calc non Af Amer: 50 mL/min — ABNORMAL LOW (ref >60)
Glucose, Bld: 100 mg/dL — ABNORMAL HIGH (ref 65–99)
Potassium: 3.2 mmol/L — ABNORMAL LOW (ref 3.5–5.1)
SODIUM: 137 mmol/L (ref 135–145)
TOTAL PROTEIN: 7.7 g/dL (ref 6.5–8.1)

## 2015-09-10 LAB — CBC
HCT: 43.1 % (ref 39.0–52.0)
HEMOGLOBIN: 15.4 g/dL (ref 13.0–17.0)
MCH: 30.8 pg (ref 26.0–34.0)
MCHC: 35.7 g/dL (ref 30.0–36.0)
MCV: 86.2 fL (ref 78.0–100.0)
PLATELETS: 343 10*3/uL (ref 150–400)
RBC: 5 MIL/uL (ref 4.22–5.81)
RDW: 13.9 % (ref 11.5–15.5)
WBC: 11.2 10*3/uL — ABNORMAL HIGH (ref 4.0–10.5)

## 2015-09-10 LAB — URINALYSIS, ROUTINE W REFLEX MICROSCOPIC
BILIRUBIN URINE: NEGATIVE
Glucose, UA: NEGATIVE mg/dL
HGB URINE DIPSTICK: NEGATIVE
Ketones, ur: NEGATIVE mg/dL
Leukocytes, UA: NEGATIVE
NITRITE: NEGATIVE
PROTEIN: NEGATIVE mg/dL
SPECIFIC GRAVITY, URINE: 1.005 (ref 1.005–1.030)
pH: 6 (ref 5.0–8.0)

## 2015-09-10 LAB — LIPASE, BLOOD: Lipase: 25 U/L (ref 11–51)

## 2015-09-10 MED ORDER — MORPHINE SULFATE (PF) 4 MG/ML IV SOLN
4.0000 mg | Freq: Once | INTRAVENOUS | Status: AC
  Administered 2015-09-10: 4 mg via INTRAVENOUS
  Filled 2015-09-10: qty 1

## 2015-09-10 MED ORDER — IOPAMIDOL (ISOVUE-300) INJECTION 61%
100.0000 mL | Freq: Once | INTRAVENOUS | Status: AC | PRN
  Administered 2015-09-10: 100 mL via INTRAVENOUS

## 2015-09-10 MED ORDER — ACETAMINOPHEN 500 MG PO TABS
1000.0000 mg | ORAL_TABLET | Freq: Once | ORAL | Status: AC
  Administered 2015-09-10: 1000 mg via ORAL
  Filled 2015-09-10: qty 2

## 2015-09-10 MED ORDER — SODIUM CHLORIDE 0.9 % IV BOLUS (SEPSIS)
1000.0000 mL | Freq: Once | INTRAVENOUS | Status: AC
  Administered 2015-09-10: 1000 mL via INTRAVENOUS

## 2015-09-10 MED ORDER — METHYLPREDNISOLONE SODIUM SUCC 125 MG IJ SOLR
125.0000 mg | Freq: Once | INTRAMUSCULAR | Status: AC
  Administered 2015-09-10: 125 mg via INTRAVENOUS
  Filled 2015-09-10: qty 2

## 2015-09-10 MED ORDER — ONDANSETRON HCL 4 MG/2ML IJ SOLN
4.0000 mg | Freq: Once | INTRAMUSCULAR | Status: AC
  Administered 2015-09-10: 4 mg via INTRAVENOUS
  Filled 2015-09-10: qty 2

## 2015-09-10 MED ORDER — DIPHENHYDRAMINE HCL 50 MG/ML IJ SOLN
25.0000 mg | Freq: Once | INTRAMUSCULAR | Status: AC
  Administered 2015-09-10: 25 mg via INTRAVENOUS
  Filled 2015-09-10: qty 1

## 2015-09-10 MED ORDER — DIATRIZOATE MEGLUMINE & SODIUM 66-10 % PO SOLN: 15.0000 mL | ORAL | Status: DC | PRN

## 2015-09-10 NOTE — ED Provider Notes (Signed)
CSN: [id number]     Arrival date & time 09/10/15  1836 History   First MD Initiated Contact with Patient 09/10/15 2024     Chief Complaint  Patient presents with  . Abdominal Pain  . Back Pain     (Consider location/radiation/quality/duration/timing/severity/associated sxs/prior Treatment) HPI   Blood pressure 135/89, pulse 83, temperature 98.8 F (37.1 C), temperature source Oral, resp. rate 18, SpO2 94 %.  [name] is a 60 y.o. male complaining of mid abdominal pain at the side of his hernia onset 2 days ago. Patient also reports exacerbation of his chronic low back pain, he rates his pain at 10 out of 10, is taking unknown pain medication at home. He denies fever, chills, nausea, vomiting, change in bowel or bladder habits, numbness, weakness, decreased by mouth intake.  Past Medical History  Diagnosis Date  . ALLERGIC RHINITIS 01/25/2007  . ANXIETY DISORDER, GENERALIZED 01/25/2007  . DEPRESSION 01/25/2007  . ESOPHAGITIS 12/28/2007  . GERD 01/25/2007  . GLUCOSE INTOLERANCE, HX OF 01/25/2007  . HEMORRHOIDS, INTERNAL, WITH BLEEDING 04/26/2008  . HYPERLIPIDEMIA 12/27/2007  . HYPERTENSION, ESSENTIAL NOS 01/25/2007  . Hyperthyroidism 08/11/2010  . HYPERTROPHY PROSTATE W/UR OBST & OTH LUTS 02/27/2010  . 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 LOW BACK PAIN, CHRONIC 11/08/2007  . 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 SCOLIOSIS NEC 01/25/2007  . Sebaceous cyst 08/10/2010  . SMOKER 08/05/2009  . UPPER GASTROINTESTINAL HEMORRHAGE 01/25/2007  . Rectal abscess   . Pancreatitis   . Fatty liver 10/09/2010  . Hiatal hernia   . Heart murmur     hx of   . Arthritis    Past Surgical History  Procedure Laterality Date  . Excision of thyroid mass    . Abdominal exploration surgery  1988  . Hernia repair      ventral  . Cholecystectomy  05/20/08    Dr. Zella Richer  . Excision of scalp lesion  07/2009  . Shoulder surgery      right  . Ganglion cyst excision      right foot x 2  .  Incisional hernia repair  12/01/2011    Procedure: LAPAROSCOPIC INCISIONAL HERNIA;  Surgeon: Harl Bowie, MD;  Location: [facility];  Service: General;  Laterality: N/A;  . Esophagogastroduodenoscopy N/A 12/31/2013    Procedure: ESOPHAGOGASTRODUODENOSCOPY (EGD);  Surgeon: Gatha Mayer, MD;  Location: [facility];  Service: Endoscopy;  Laterality: N/A;  . Colonoscopy  04/23/2010    normal   Family History  Problem Relation Age of Onset  . Hypertension Father   . Hypertension Mother   . Prostate cancer Other   . Colon cancer Neg Hx    Social History  Substance Use Topics  . Smoking status: Current Every Day Smoker -- 1.50 packs/day for 30 years    Types: Cigarettes  . Smokeless tobacco: None  . Alcohol Use: No    Review of Systems  10 systems reviewed and found to be negative, except as noted in the HPI.   Allergies  Celecoxib; Iohexol; and Metoclopramide hcl  Home Medications   Prior to Admission medications   Medication Sig Start Date End Date Taking? Authorizing Provider  alum & mag hydroxide-simeth (MAALOX/MYLANTA) 200-200-20 MG/5ML suspension Take 30 mLs by mouth every 6 (six) hours as needed for indigestion or heartburn (for upset stomach).   Yes Historical Provider, MD  amLODipine (NORVASC) 10 MG tablet Take 1 tablet (10 mg total) by mouth daily. 07/02/15  Yes Cassandria Anger, MD  cholecalciferol (VITAMIN D) 1000 units tablet Take 1,000 Units by mouth daily.   Yes Historical Provider, MD  fluPHENAZine (PROLIXIN) 10 MG tablet Take 10 mg by mouth at bedtime.   Yes Historical Provider, MD  meloxicam (MOBIC) 15 MG tablet Use qd pc prn arthritis Patient taking differently: Take 15 mg by mouth daily.  04/23/15  Yes Aleksei Plotnikov V, MD  pantoprazole (PROTONIX) 40 MG tablet TAKE 1 TABLET (40 MG TOTAL) BY MOUTH 2 (TWO) TIMES DAILY. 06/11/15  Yes Aleksei Plotnikov V, MD  buPROPion (WELLBUTRIN SR) 150 MG 12 hr tablet Take 150 mg by mouth 2 (two) times daily. 07/23/15    Historical Provider, MD  calcium carbonate (TUMS - DOSED IN MG ELEMENTAL CALCIUM) 500 MG chewable tablet Chew 2 tablets by mouth daily as needed for indigestion or heartburn (for upset stomach).    Historical Provider, MD  dicyclomine (BENTYL) 10 MG capsule Take 2 capsules (20 mg total) by mouth 4 (four) times daily as needed for spasms. 09/11/15   Jamika Sadek, PA-C  ergocalciferol (VITAMIN D2) 50000 UNITS capsule Take 1 capsule (50,000 Units total) by mouth once a week. Patient not taking: Reported on 09/10/2015 03/07/14   Aleksei Plotnikov V, MD  gabapentin (NEURONTIN) 300 MG capsule Take 300 mg by mouth 3 (three) times daily. 08/16/15   Historical Provider, MD  mupirocin ointment (BACTROBAN) 2 % Applied twice a day to the affected area;NOT into eyes. Patient not taking: Reported on 09/10/2015 08/01/14   Hendricks Limes, MD  potassium chloride SA (K-DUR,KLOR-CON) 20 MEQ tablet Take 1 tablet (20 mEq total) by mouth daily. 09/11/15   Mousa Prout, PA-C   BP 136/78 mmHg  Pulse 79  Temp(Src) 97.9 F (36.6 C) (Oral)  Resp 20  SpO2 100% Physical Exam  Constitutional: He is oriented to person, place, and time. He appears well-developed and well-nourished. No distress.  HENT:  Head: Normocephalic and atraumatic.  Mouth/Throat: Oropharynx is clear and moist.  Eyes: Conjunctivae and EOM are normal. Pupils are equal, round, and reactive to light.  Neck: Normal range of motion.  Cardiovascular: Normal rate, regular rhythm and intact distal pulses.   Pulmonary/Chest: Effort normal and breath sounds normal. No stridor.  Abdominal: Soft. Bowel sounds are normal. He exhibits mass. He exhibits no distension. There is tenderness. There is no rebound and no guarding.  Umbilical hernia without focal tenderness or overlying skin changes. Diffusely tender to light palpation of all quadrants.  Musculoskeletal: Normal range of motion.  Neurological: He is alert and oriented to person, place, and time.   Skin: He is not diaphoretic.  Psychiatric: He has a normal mood and affect.  Nursing note and vitals reviewed.   ED Course  Procedures (including critical care time) Labs Review Labs Reviewed  COMPREHENSIVE METABOLIC PANEL - Abnormal; Notable for the following:    Potassium 3.2 (*)    Glucose, Bld 100 (*)    Creatinine, Ser 1.48 (*)    GFR calc non Af Amer 50 (*)    GFR calc Af Amer 58 (*)    All other components within normal limits  CBC - Abnormal; Notable for the following:    WBC 11.2 (*)    All other components within normal limits  LIPASE, BLOOD  URINALYSIS, ROUTINE W REFLEX MICROSCOPIC (NOT AT [facility])    Imaging Review Ct Abdomen Pelvis W Contrast  09/10/2015  CLINICAL DATA:  Acute onset of generalized abdominal pain. Initial encounter. EXAM: CT ABDOMEN AND PELVIS WITH CONTRAST TECHNIQUE: Multidetector  CT imaging of the abdomen and pelvis was performed using the standard protocol following bolus administration of intravenous contrast. CONTRAST:  120mL ISOVUE-300 IOPAMIDOL (ISOVUE-300) INJECTION 61% COMPARISON:  CT of the abdomen and pelvis performed 12/31/2013 FINDINGS: Minimal bibasilar atelectasis is noted. A small anterior abdominal wall hernia is noted at the upper mid abdomen, containing only fat. The liver and spleen are unremarkable in appearance. The patient is status post cholecystectomy, with clips noted at the gallbladder fossa. The pancreas and adrenal glands are unremarkable. The kidneys are unremarkable in appearance. There is no evidence of hydronephrosis. No renal or ureteral stones are seen. No perinephric stranding is appreciated. No free fluid is identified. The small bowel is unremarkable in appearance. The stomach is within normal limits. No acute vascular abnormalities are seen. Scattered calcification is seen along the abdominal aorta and its branches. The appendix is not definitely characterized; there is no evidence for appendicitis. The colon is grossly  unremarkable in appearance. The bladder is moderately distended and grossly unremarkable. The prostate is enlarged, measuring 6.0 cm in AP dimension. No inguinal lymphadenopathy is seen. A 1.5 cm subcutaneous cystic focus is noted anterior to the right hip. No acute osseous abnormalities are identified. IMPRESSION: 1. No acute abnormality seen within the abdomen or pelvis. 2. Scattered calcification along the abdominal aorta and its branches. 3. Small anterior abdominal wall hernia at the upper mid abdomen, containing only fat. 4. Enlarged prostate noted. 5. 1.5 cm subcutaneous cystic focus anterior to the right hip. Electronically Signed   By: Garald Balding M.D.   On: 09/10/2015 23:22   I have personally reviewed and evaluated these images and lab results as part of my medical decision-making.   EKG Interpretation None      MDM   Final diagnoses:  Acute exacerbation of chronic low back pain  Generalized abdominal pain  Umbilical hernia without obstruction and without gangrene  Hypokalemia    Filed Vitals:   09/10/15 1916 09/10/15 2130 09/11/15 0018  BP: 134/90 135/89 136/78  Pulse: 101 83 79  Temp: 98.8 F (37.1 C)  97.9 F (36.6 C)  TempSrc: Oral    Resp: 22 18 20   SpO2: 98% 94% 100%    Medications  diatrizoate meglumine-sodium (GASTROGRAFIN) 66-10 % solution 15 mL (not administered)  morphine 4 MG/ML injection 4 mg (4 mg Intravenous Given 09/10/15 2039)  ondansetron (ZOFRAN) injection 4 mg (4 mg Intravenous Given 09/10/15 2039)  acetaminophen (TYLENOL) tablet 1,000 mg (1,000 mg Oral Given 09/10/15 2151)  sodium chloride 0.9 % bolus 1,000 mL (0 mLs Intravenous Stopped 09/11/15 0022)  diphenhydrAMINE (BENADRYL) injection 25 mg (25 mg Intravenous Given 09/10/15 2151)  methylPREDNISolone sodium succinate (SOLU-MEDROL) 125 mg/2 mL injection 125 mg (125 mg Intravenous Given 09/10/15 2151)  iopamidol (ISOVUE-300) 61 % injection 100 mL (100 mLs Intravenous Contrast Given 09/10/15 2258)   potassium chloride SA (K-DUR,KLOR-CON) CR tablet 40 mEq (40 mEq Oral Given 09/11/15 0027)    [name] is 60 y.o. male presenting with Exacerbation of chronic low back pain in addition to abdominal pain without nausea, vomiting diarrhea, patient is afebrile, overall very well appearing. Physical exam with umbilical hernia, no focal tenderness in the area of the hernia. Blood work is a mild hypokalemia, his creatinine is 1.48 which is not atypical for his baseline.  CT abdomen pelvis with no acute abnormality, the hernia is only fat containing. Advised him to follow closely with primary care.   Evaluation does not show pathology that would require ongoing  emergent intervention or inpatient treatment. Pt is hemodynamically stable and mentating appropriately. Discussed findings and plan with patient/guardian, who agrees with care plan. All questions answered. Return precautions discussed and outpatient follow up given.   Discharge Medication List as of 09/11/2015 12:07 AM    START taking these medications   Details  dicyclomine (BENTYL) 10 MG capsule Take 2 capsules (20 mg total) by mouth 4 (four) times daily as needed for spasms., Starting 09/11/2015, Until Discontinued, [employer], PA-C 09/11/15 0040  Daleen Bo, MD 09/11/15 1116

## 2015-09-10 NOTE — ED Notes (Signed)
Pt states that he has been having abd pain for a day or so; pt denies N/V; pt c/o heartburn but that it not new for pt; pt with hx of hernia and has swelling to abd area; pt states that he has chronic back and that it has been worse over the last 2 days; pt denies injury to back

## 2015-09-10 NOTE — ED Notes (Signed)
Pt made aware of need for urine 

## 2015-09-11 DIAGNOSIS — M545 Low back pain: Secondary | ICD-10-CM | POA: Diagnosis not present

## 2015-09-11 MED ORDER — DICYCLOMINE HCL 10 MG PO CAPS: 20.0000 mg | ORAL_CAPSULE | Freq: Four times a day (QID) | ORAL | Status: DC | PRN

## 2015-09-11 MED ORDER — POTASSIUM CHLORIDE CRYS ER 20 MEQ PO TBCR: 20.0000 meq | EXTENDED_RELEASE_TABLET | Freq: Every day | ORAL | Status: DC

## 2015-09-11 MED ORDER — POTASSIUM CHLORIDE CRYS ER 20 MEQ PO TBCR
40.0000 meq | EXTENDED_RELEASE_TABLET | Freq: Once | ORAL | Status: AC
  Administered 2015-09-11: 40 meq via ORAL
  Filled 2015-09-11: qty 2

## 2015-09-11 NOTE — Discharge Instructions (Signed)
Please follow with your primary care doctor in the next 2 days for a check-up. They must obtain records for further management.  ° °Do not hesitate to return to the Emergency Department for any new, worsening or concerning symptoms.  ° ° °Abdominal Pain, Adult °Many things can cause abdominal pain. Usually, abdominal pain is not caused by a disease and will improve without treatment. It can often be observed and treated at home. Your health care provider will do a physical exam and possibly order blood tests and X-rays to help determine the seriousness of your pain. However, in many cases, more time must pass before a clear cause of the pain can be found. Before that point, your health care provider may not know if you need more testing or further treatment. °HOME CARE INSTRUCTIONS °Monitor your abdominal pain for any changes. The following actions may help to alleviate any discomfort you are experiencing: °· Only take over-the-counter or prescription medicines as directed by your health care provider. °· Do not take laxatives unless directed to do so by your health care provider. °· Try a clear liquid diet (broth, tea, or water) as directed by your health care provider. Slowly move to a bland diet as tolerated. °SEEK MEDICAL CARE IF: °· You have unexplained abdominal pain. °· You have abdominal pain associated with nausea or diarrhea. °· You have pain when you urinate or have a bowel movement. °· You experience abdominal pain that wakes you in the night. °· You have abdominal pain that is worsened or improved by eating food. °· You have abdominal pain that is worsened with eating fatty foods. °· You have a fever. °SEEK IMMEDIATE MEDICAL CARE IF: °· Your pain does not go away within 2 hours. °· You keep throwing up (vomiting). °· Your pain is felt only in portions of the abdomen, such as the right side or the left lower portion of the abdomen. °· You pass bloody or black tarry stools. °MAKE SURE YOU: °· Understand  these instructions. °· Will watch your condition. °· Will get help right away if you are not doing well or get worse. °  °This information is not intended to replace advice given to you by your health care provider. Make sure you discuss any questions you have with your health care provider. °  °Document Released: 01/13/2005 Document Revised: 12/25/2014 Document Reviewed: 12/13/2012 °Elsevier Interactive Patient Education ©2016 [employer] ° °

## 2015-10-02 ENCOUNTER — Ambulatory Visit: Payer: BLUE CROSS/BLUE SHIELD | Admitting: Internal Medicine

## 2015-11-25 ENCOUNTER — Emergency Department (HOSPITAL_COMMUNITY): Payer: BLUE CROSS/BLUE SHIELD

## 2015-11-25 ENCOUNTER — Emergency Department (HOSPITAL_COMMUNITY)
Admission: EM | Admit: 2015-11-25 | Discharge: 2015-11-26 | Disposition: A | Discharge status: Home / Self Care | Payer: BLUE CROSS/BLUE SHIELD | Attending: Emergency Medicine | Admitting: Emergency Medicine

## 2015-11-25 DIAGNOSIS — E876 Hypokalemia: Secondary | ICD-10-CM | POA: Diagnosis not present

## 2015-11-25 DIAGNOSIS — Y929 Unspecified place or not applicable: Secondary | ICD-10-CM | POA: Insufficient documentation

## 2015-11-25 DIAGNOSIS — Z79899 Other long term (current) drug therapy: Secondary | ICD-10-CM | POA: Diagnosis not present

## 2015-11-25 DIAGNOSIS — I10 Essential (primary) hypertension: Secondary | ICD-10-CM | POA: Diagnosis not present

## 2015-11-25 DIAGNOSIS — R55 Syncope and collapse: Secondary | ICD-10-CM | POA: Diagnosis not present

## 2015-11-25 DIAGNOSIS — W1830XA Fall on same level, unspecified, initial encounter: Secondary | ICD-10-CM | POA: Diagnosis not present

## 2015-11-25 DIAGNOSIS — F17210 Nicotine dependence, cigarettes, uncomplicated: Secondary | ICD-10-CM | POA: Diagnosis not present

## 2015-11-25 DIAGNOSIS — Y939 Activity, unspecified: Secondary | ICD-10-CM | POA: Insufficient documentation

## 2015-11-25 DIAGNOSIS — G44209 Tension-type headache, unspecified, not intractable: Secondary | ICD-10-CM | POA: Diagnosis not present

## 2015-11-25 DIAGNOSIS — S0093XA Contusion of unspecified part of head, initial encounter: Secondary | ICD-10-CM | POA: Diagnosis not present

## 2015-11-25 DIAGNOSIS — S0990XA Unspecified injury of head, initial encounter: Secondary | ICD-10-CM | POA: Diagnosis not present

## 2015-11-25 DIAGNOSIS — Y999 Unspecified external cause status: Secondary | ICD-10-CM | POA: Insufficient documentation

## 2015-11-25 LAB — COMPREHENSIVE METABOLIC PANEL
ALK PHOS: 60 U/L (ref 38–126)
ALT: 14 U/L — ABNORMAL LOW (ref 17–63)
ANION GAP: 7 (ref 5–15)
AST: 18 U/L (ref 15–41)
Albumin: 3.4 g/dL — ABNORMAL LOW (ref 3.5–5.0)
BILIRUBIN TOTAL: 0.4 mg/dL (ref 0.3–1.2)
BUN: 6 mg/dL (ref 6–20)
CALCIUM: 8.9 mg/dL (ref 8.9–10.3)
CO2: 27 mmol/L (ref 22–32)
Chloride: 104 mmol/L (ref 101–111)
Creatinine, Ser: 1.35 mg/dL — ABNORMAL HIGH (ref 0.61–1.24)
GFR, EST NON AFRICAN AMERICAN: 56 mL/min — AB (ref >60)
Glucose, Bld: 95 mg/dL (ref 65–99)
POTASSIUM: 2.9 mmol/L — AB (ref 3.5–5.1)
Sodium: 138 mmol/L (ref 135–145)
TOTAL PROTEIN: 6.2 g/dL — AB (ref 6.5–8.1)

## 2015-11-25 LAB — CBC WITH DIFFERENTIAL/PLATELET
BASOS PCT: 0 %
Basophils Absolute: 0 10*3/uL (ref 0.0–0.1)
Eosinophils Absolute: 0.1 10*3/uL (ref 0.0–0.7)
Eosinophils Relative: 1 %
HEMATOCRIT: 40.5 % (ref 39.0–52.0)
HEMOGLOBIN: 13.8 g/dL (ref 13.0–17.0)
LYMPHS ABS: 2.2 10*3/uL (ref 0.7–4.0)
LYMPHS PCT: 23 %
MCH: 29.7 pg (ref 26.0–34.0)
MCHC: 34.1 g/dL (ref 30.0–36.0)
MCV: 87.1 fL (ref 78.0–100.0)
MONO ABS: 0.7 10*3/uL (ref 0.1–1.0)
MONOS PCT: 7 %
NEUTROS ABS: 6.5 10*3/uL (ref 1.7–7.7)
Neutrophils Relative %: 69 %
Platelets: 295 10*3/uL (ref 150–400)
RBC: 4.65 MIL/uL (ref 4.22–5.81)
RDW: 14 % (ref 11.5–15.5)
WBC: 9.5 10*3/uL (ref 4.0–10.5)

## 2015-11-25 LAB — URINALYSIS, ROUTINE W REFLEX MICROSCOPIC
Bilirubin Urine: NEGATIVE
GLUCOSE, UA: NEGATIVE mg/dL
Hgb urine dipstick: NEGATIVE
KETONES UR: NEGATIVE mg/dL
LEUKOCYTES UA: NEGATIVE
Nitrite: NEGATIVE
PROTEIN: NEGATIVE mg/dL
Specific Gravity, Urine: 1.014 (ref 1.005–1.030)
pH: 6 (ref 5.0–8.0)

## 2015-11-25 MED ORDER — POTASSIUM CHLORIDE CRYS ER 20 MEQ PO TBCR
40.0000 meq | EXTENDED_RELEASE_TABLET | Freq: Once | ORAL | Status: AC
  Administered 2015-11-25: 40 meq via ORAL
  Filled 2015-11-25: qty 2

## 2015-11-25 MED ORDER — HYDROMORPHONE HCL 1 MG/ML IJ SOLN
0.5000 mg | Freq: Once | INTRAMUSCULAR | Status: AC
  Administered 2015-11-25: 0.5 mg via INTRAVENOUS
  Filled 2015-11-25: qty 1

## 2015-11-25 MED ORDER — POTASSIUM CHLORIDE 10 MEQ/100ML IV SOLN
10.0000 meq | Freq: Once | INTRAVENOUS | Status: AC
  Administered 2015-11-25: 10 meq via INTRAVENOUS
  Filled 2015-11-25: qty 100

## 2015-11-25 MED ORDER — ONDANSETRON HCL 4 MG/2ML IJ SOLN
4.0000 mg | Freq: Once | INTRAMUSCULAR | Status: AC
  Administered 2015-11-25: 4 mg via INTRAVENOUS
  Filled 2015-11-25: qty 2

## 2015-11-25 MED ORDER — SODIUM CHLORIDE 0.9 % IV BOLUS (SEPSIS)
1000.0000 mL | Freq: Once | INTRAVENOUS | Status: AC
  Administered 2015-11-25: 1000 mL via INTRAVENOUS

## 2015-11-25 NOTE — ED Notes (Signed)
Patient transported to CT 

## 2015-11-25 NOTE — ED Notes (Signed)
Pt reports "falling at home. Headache" Reports taking Schizophrenia medicine at home at night. Needs it tonight. " Wife at the bedside

## 2015-11-25 NOTE — ED Triage Notes (Signed)
Pt in c/o R temporal HA onset today post trying new brand of cigarettes, non productive, chronic pain, A&O x4

## 2015-11-25 NOTE — ED Provider Notes (Signed)
[facility] DEPT Provider Note   CSN: [id number] Arrival date & time: 11/25/15  [id number]  First Provider Contact:  First MD Initiated Contact with Patient 11/25/15 1902        History   Chief Complaint Chief Complaint  Patient presents with  . Migraine  . Cough    HPI Nevan Grams is a 60 y.o. male.  60 yo AA male pmh significant for HTN, hyperthyroidism, and schizoprhenia that presents by EMS this evening after syncopal episode and hitting his head. Patient states he was smoking a cigarette when he became dizzy and lightheaded after standing up and passed out. Positive LOC per wife. She states he was unconscious for about a minute. She also states when he fell he hit the left side of his head on the floor. She states that when he woke up he was slightly combative and confused. He did not remember what happened. His wife states he went to change his shirt and did not realize what he was doing. Patient only complains of left sided headache. He rates that pain a 7/10. Patient seems to slurring words in the room but wife states that this is his baseline. Patient is able to comprehend and answer my questions. Patient thought process seems to be tangential. But wife states that this is baseline for patient. He denies any visual disturbances, cp, sob, n/v/d. Patient has cough that is chronic. Denis any increase in sputum production or change in sputum. Denies any fevers or chills. Denies being on blood thinners.   The history is provided by the patient and the spouse.   Associated symptoms include headaches. Pertinent negatives include no chest pain, no abdominal pain and no shortness of breath.  Cough Associated symptoms include headaches. Pertinent negatives include no chest pain, no chills and no shortness of breath.    Past Medical History:  Diagnosis Date  . ALLERGIC RHINITIS 01/25/2007  . ANXIETY DISORDER, GENERALIZED 01/25/2007  . Arthritis   . DEPRESSION 01/25/2007  . ESOPHAGITIS  12/28/2007  . Fatty liver 10/09/2010  . GERD 01/25/2007  . GLUCOSE INTOLERANCE, HX OF 01/25/2007  . Heart murmur    hx of   . HEMORRHOIDS, INTERNAL, WITH BLEEDING 04/26/2008  . Hiatal hernia   . HYPERLIPIDEMIA 12/27/2007  . HYPERTENSION, ESSENTIAL NOS 01/25/2007  . Hyperthyroidism 08/11/2010  . HYPERTROPHY PROSTATE W/UR OBST & OTH LUTS 02/27/2010  . 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 LOW BACK PAIN, CHRONIC 11/08/2007  . Pancreatitis   . Rectal abscess   . 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 SCOLIOSIS NEC 01/25/2007  . Sebaceous cyst 08/10/2010  . SMOKER 08/05/2009  . UPPER GASTROINTESTINAL HEMORRHAGE 01/25/2007    Patient Active Problem List   Diagnosis Date Noted  . Pain in joint, shoulder region 04/23/2015  . Periumbilical abdominal pain 02/13/2015  . Elevated PSA 08/04/2014  . Subacromial bursitis 04/11/2014  . Neck pain on right side 03/05/2014  . Bilateral shoulder pain 03/05/2014  . Recurrent boils 03/05/2014  . Foreign body in stomach 12/31/2013  . Reflux esophagitis 12/31/2013  . Weight loss 05/06/2012  . Incisional hernia 11/23/2011  . Cramp of limb 01/13/2011  . Hyperthyroidism 08/11/2010  . Sebaceous cyst 08/10/2010  . HYPERTROPHY PROSTATE W/UR OBST & OTH LUTS 02/27/2010  . SMOKER 08/05/2009  . 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 HEMORRHOIDS, INTERNAL, WITH BLEEDING 04/26/2008  . HYPERLIPIDEMIA 12/27/2007  . LOW BACK PAIN, CHRONIC 11/08/2007  . 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 ANXIETY DISORDER, GENERALIZED 01/25/2007  .  DEPRESSION 01/25/2007  . Essential hypertension 01/25/2007  . ALLERGIC RHINITIS 01/25/2007  . SCOLIOSIS NEC 01/25/2007  . Impaired glucose tolerance 01/25/2007    Past Surgical History:  Procedure Laterality Date  . ABDOMINAL EXPLORATION SURGERY  1988  . CHOLECYSTECTOMY  05/20/08   Dr. Zella Richer  . COLONOSCOPY  04/23/2010   normal  . ESOPHAGOGASTRODUODENOSCOPY N/A 12/31/2013   Procedure: ESOPHAGOGASTRODUODENOSCOPY (EGD);   Surgeon: Gatha Mayer, MD;  Location: [facility];  Service: Endoscopy;  Laterality: N/A;  . Excision of scalp lesion  07/2009  . Excision of thyroid mass    . GANGLION CYST EXCISION     right foot x 2  . HERNIA REPAIR     ventral  . INCISIONAL HERNIA REPAIR  12/01/2011   Procedure: LAPAROSCOPIC INCISIONAL HERNIA;  Surgeon: Harl Bowie, MD;  Location: [facility];  Service: General;  Laterality: N/A;  . SHOULDER SURGERY     right       Home Medications    Prior to Admission medications   Medication Sig Start Date End Date Taking? Authorizing Provider  alum & mag hydroxide-simeth (MAALOX/MYLANTA) 200-200-20 MG/5ML suspension Take 30 mLs by mouth every 6 (six) hours as needed for indigestion or heartburn (for upset stomach).    Historical Provider, MD  amLODipine (NORVASC) 10 MG tablet Take 1 tablet (10 mg total) by mouth daily. 07/02/15   Evie Lacks Plotnikov, MD  buPROPion (WELLBUTRIN SR) 150 MG 12 hr tablet Take 150 mg by mouth 2 (two) times daily. 07/23/15   Historical Provider, MD  calcium carbonate (TUMS - DOSED IN MG ELEMENTAL CALCIUM) 500 MG chewable tablet Chew 2 tablets by mouth daily as needed for indigestion or heartburn (for upset stomach).    Historical Provider, MD  cholecalciferol (VITAMIN D) 1000 units tablet Take 1,000 Units by mouth daily.    Historical Provider, MD  dicyclomine (BENTYL) 10 MG capsule Take 2 capsules (20 mg total) by mouth 4 (four) times daily as needed for spasms. 09/11/15   Nicole [name], PA-C  ergocalciferol (VITAMIN D2) 50000 UNITS capsule Take 1 capsule (50,000 Units total) by mouth once a week. Patient not taking: Reported on 09/10/2015 03/07/14   Cassandria Anger, MD  fluPHENAZine (PROLIXIN) 10 MG tablet Take 10 mg by mouth at bedtime.    Historical Provider, MD  gabapentin (NEURONTIN) 300 MG capsule Take 300 mg by mouth 3 (three) times daily. 08/16/15   Historical Provider, MD  meloxicam (MOBIC) 15 MG tablet Use qd pc prn arthritis Patient  taking differently: Take 15 mg by mouth daily.  04/23/15   Evie Lacks Plotnikov, MD  mupirocin ointment (BACTROBAN) 2 % Applied twice a day to the affected area;NOT into eyes. Patient not taking: Reported on 09/10/2015 08/01/14   Hendricks Limes, MD  pantoprazole (PROTONIX) 40 MG tablet TAKE 1 TABLET (40 MG TOTAL) BY MOUTH 2 (TWO) TIMES DAILY. 06/11/15   Evie Lacks Plotnikov, MD  potassium chloride SA (K-DUR,KLOR-CON) 20 MEQ tablet Take 1 tablet (20 mEq total) by mouth daily. 09/11/15   Elmyra Ricks [name], PA-C    Family History Family History  Problem Relation Age of Onset  . Hypertension Father   . Hypertension Mother   . Prostate cancer Other   . Colon cancer Neg Hx     Social History Social History  Substance Use Topics  . Smoking status: Current Every Day Smoker    Packs/day: 1.50    Years: 30.00    Types: Cigarettes  . Smokeless tobacco: Not on  file  . Alcohol use No     Allergies   Celecoxib; Iohexol; and Metoclopramide hcl   Review of Systems Review of Systems  Constitutional: Negative for chills and fever.  Eyes: Negative for photophobia and visual disturbance.  Respiratory: Positive for cough. Negative for chest tightness and shortness of breath.   Cardiovascular: Negative for chest pain and palpitations.  Gastrointestinal: Negative for abdominal pain, diarrhea, nausea and vomiting.  Genitourinary: Negative for dysuria, flank pain, frequency and urgency.  Musculoskeletal: Negative.   Skin: Negative.   Neurological: Positive for dizziness, syncope, light-headedness and headaches. Negative for weakness and numbness.     Physical Exam Updated Vital Signs Ht 5\' 9"  (1.753 m)   Wt 109.3 kg   SpO2 98%   BMI 35.59 kg/m   Physical Exam  Constitutional: He is oriented to person, place, and time. He appears well-developed and well-nourished. No distress.  HENT:  Head: Normocephalic. Head is with contusion. Head is without raccoon's eyes, without Battle's sign and  without laceration.    Right Ear: External ear normal.  Left Ear: External ear normal.  Mouth/Throat: Oropharynx is clear and moist.  Eyes: Conjunctivae and EOM are normal. Pupils are equal, round, and reactive to light.  Neck: Normal range of motion. Neck supple. No thyromegaly present.  Cardiovascular: Normal rate, regular rhythm, normal heart sounds and intact distal pulses.   Pulmonary/Chest: Effort normal and breath sounds normal.  Abdominal: Soft. Bowel sounds are normal. There is no tenderness. There is no rebound and no guarding.  Lymphadenopathy:    He has no cervical adenopathy.  Neurological: He is alert and oriented to person, place, and time. He has normal strength. No cranial nerve deficit or sensory deficit. He exhibits normal muscle tone. Coordination normal. GCS eye subscore is 4. GCS verbal subscore is 5. GCS motor subscore is 6.  Negative pronator drift. Hand grip equal and bilateral. Cranial nerve 2-12 grossly intact. Patient seems to be slurring words but that is normal per wife.  Skin: Skin is warm and dry. Capillary refill takes less than 2 seconds.  Nursing note and vitals reviewed.    ED Treatments / Results  Labs (all labs ordered are listed, but only abnormal results are displayed) Labs Reviewed  COMPREHENSIVE METABOLIC PANEL - Abnormal; Notable for the following:       Result Value   Potassium 2.9 (*)    Creatinine, Ser 1.35 (*)    Total Protein 6.2 (*)    Albumin 3.4 (*)    ALT 14 (*)    GFR calc non Af Amer 56 (*)    All other components within normal limits  CBC WITH DIFFERENTIAL/PLATELET  URINALYSIS, ROUTINE W REFLEX MICROSCOPIC (NOT AT [facility])    EKG  EKG Interpretation None      EKG reviewed with NSR.[name]  Radiology Ct Head Wo Contrast  Result Date: 11/25/2015 CLINICAL DATA:  Syncope with fall.  Generalized headache. EXAM: CT HEAD WITHOUT CONTRAST TECHNIQUE: Contiguous axial images were obtained from the base of the skull through the vertex  without intravenous contrast. COMPARISON:  None. FINDINGS: Extracranial soft tissues are unremarkable. No significant abnormalities in the mastoid air cells, middle ears, or paranasal sinuses. No acute bony abnormalities. No subdural, epidural, or subarachnoid hemorrhage. Ventricles and sulci are normal. Cerebellum, brainstem, and basal cisterns are normal. No acute cortical ischemia or infarct. No mass, mass effect, or midline shift IMPRESSION: Normal. Electronically Signed   By: Dorise Bullion III M.D   On: 11/25/2015 19:42  Procedures Procedures (including critical care time)  Medications Ordered in ED Medications  HYDROmorphone (DILAUDID) injection 0.5 mg (0.5 mg Intravenous Given 11/25/15 1942)  ondansetron (ZOFRAN) injection 4 mg (4 mg Intravenous Given 11/25/15 1942)  potassium chloride 10 mEq in 100 mL IVPB (10 mEq Intravenous New Bag/Given 11/25/15 2203)  potassium chloride SA (K-DUR,KLOR-CON) CR tablet 40 mEq (40 mEq Oral Given 11/25/15 2202)  sodium chloride 0.9 % bolus 1,000 mL (1,000 mLs Intravenous New Bag/Given 11/25/15 2209)     Initial Impression / Assessment and Plan / ED Course  I have reviewed the triage vital signs and the nursing notes.  Pertinent labs & imaging results that were available during my care of the patient were reviewed by me and considered in my medical decision making (see chart for details).  Clinical Course  Value Comment By Time  ED EKG (Reviewed) Julianne Rice, MD 08/08 1958  ED EKG (Reviewed) Julianne Rice, MD 08/08 1958  Ketones, ur: NEGATIVE (Reviewed) Doristine Devoid, PA-C 08/08 2141  1902 Patient seen and evaluated by provider. Labs, mediation, and imaging ordered.  9:40 PM Potassium noted to be 2.9. IV and oral K+ ordered. IV fluids ordered due to slightly elevated creatine. Orthostatics were ordered earlier but have not been completed. Discussed plan of care with Dr Lita Mains. Patient is aware.  1:20 AM Patient was discharged after  potassium and fluid finished during EPIC downtime. Dr. Lita Mains aware of plan of care.   Final Clinical Impressions(s) / ED Diagnoses   Final diagnoses:  Syncope, unspecified syncope type  Head contusion, initial encounter  Hypokalemia   MDM: Patient presented this evening after syncopal episode and fall. Patient had LOC and confusion after fall. Patient had head CT that was unremarkable. Patient at baseline with no neurological deficits on exam. Patient without dizziness, confusion, or nausea in ED. Syncope workup revealed hypokalemia. Patient is on diuretic therapy for HTN and is suppose to take potassium supplement. Patient states he has not been taking them because they make him constipated. Potassium was replaced IV and PO. EKG was unremarkable. Low suspicion for arrhythmia. Serum creatine slightly elevated. Possibly due to dehydration. Fluids given in ED. Orthostatic bp were normal. All other labs unremarkable. Pain controlled in ED. Patient encouraged to take potassium supplements at home and follow up with PCP in 1 week to have potassium redrawn. Patient verbalized understanding. Dr. Lita Mains updated with plan of care and agrees. Patient discharged during downtime. Chart updated after epic was available again. New Prescriptions New Prescriptions   No medications on file     Doristine Devoid, PA-C 11/26/15 0140    Julianne Rice, MD 11/27/15 1309

## 2015-11-26 NOTE — Discharge Instructions (Signed)
Follow up with PCP. Take potassium supplements as prescribed. Can try stool softener to help with constipation associated with potassium supplement. If symptoms worsen or fail to improve please return to the ER.

## 2015-12-11 ENCOUNTER — Emergency Department (HOSPITAL_COMMUNITY)
Admission: EM | Admit: 2015-12-11 | Discharge: 2015-12-11 | Disposition: A | Discharge status: Home / Self Care | Payer: BLUE CROSS/BLUE SHIELD | Attending: Dermatology | Admitting: Dermatology

## 2015-12-11 ENCOUNTER — Encounter (HOSPITAL_COMMUNITY): Payer: Self-pay | Admitting: Emergency Medicine

## 2015-12-11 DIAGNOSIS — M79661 Pain in right lower leg: Secondary | ICD-10-CM | POA: Diagnosis not present

## 2015-12-11 DIAGNOSIS — M25511 Pain in right shoulder: Secondary | ICD-10-CM | POA: Diagnosis not present

## 2015-12-11 DIAGNOSIS — Z5321 Procedure and treatment not carried out due to patient leaving prior to being seen by health care provider: Secondary | ICD-10-CM | POA: Insufficient documentation

## 2015-12-11 DIAGNOSIS — M542 Cervicalgia: Secondary | ICD-10-CM | POA: Diagnosis not present

## 2015-12-11 DIAGNOSIS — F17210 Nicotine dependence, cigarettes, uncomplicated: Secondary | ICD-10-CM | POA: Diagnosis not present

## 2015-12-11 DIAGNOSIS — I10 Essential (primary) hypertension: Secondary | ICD-10-CM | POA: Diagnosis not present

## 2015-12-11 NOTE — ED Notes (Signed)
Pt asked to be placed in a c-collar. Informed Melissa I. - RN.

## 2015-12-11 NOTE — ED Notes (Signed)
Pt was unable to be located in waiting room when called to obtain vitals.

## 2015-12-11 NOTE — ED Triage Notes (Signed)
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 Pt sts he has appt today at 1445 but is hurting too bad and cannot wait. Pt has CD with results on it.

## 2015-12-24 DIAGNOSIS — Z77090 Contact with and (suspected) exposure to asbestos: Secondary | ICD-10-CM | POA: Diagnosis not present

## 2015-12-24 DIAGNOSIS — R0602 Shortness of breath: Secondary | ICD-10-CM | POA: Diagnosis not present

## 2016-01-26 ENCOUNTER — Emergency Department (HOSPITAL_COMMUNITY)
Admission: EM | Admit: 2016-01-26 | Discharge: 2016-01-26 | Disposition: A | Discharge status: Home / Self Care | Payer: BLUE CROSS/BLUE SHIELD | Attending: Physician Assistant | Admitting: Physician Assistant

## 2016-01-26 ENCOUNTER — Encounter (HOSPITAL_COMMUNITY): Payer: Self-pay

## 2016-01-26 DIAGNOSIS — I10 Essential (primary) hypertension: Secondary | ICD-10-CM | POA: Insufficient documentation

## 2016-01-26 DIAGNOSIS — Z4801 Encounter for change or removal of surgical wound dressing: Secondary | ICD-10-CM | POA: Insufficient documentation

## 2016-01-26 DIAGNOSIS — R109 Unspecified abdominal pain: Secondary | ICD-10-CM | POA: Diagnosis present

## 2016-01-26 DIAGNOSIS — Z79899 Other long term (current) drug therapy: Secondary | ICD-10-CM | POA: Diagnosis not present

## 2016-01-26 DIAGNOSIS — Z5189 Encounter for other specified aftercare: Secondary | ICD-10-CM

## 2016-01-26 DIAGNOSIS — F17210 Nicotine dependence, cigarettes, uncomplicated: Secondary | ICD-10-CM | POA: Insufficient documentation

## 2016-01-26 NOTE — ED Triage Notes (Signed)
Pt states that he has a syringe that was left in him over two years ago and he is having pain from this

## 2016-01-26 NOTE — Discharge Instructions (Signed)
Follow up with your GI doctor or the GI clinic listed.  Return to ER for new or worsening symptoms, any additional concerns.

## 2016-01-26 NOTE — ED Notes (Signed)
Declined W/C at D/C and was escorted to lobby by RN. 

## 2016-01-26 NOTE — ED Provider Notes (Signed)
[facility] DEPT Provider Note   CSN: [id number] Arrival date & time: 01/26/16  1004 By signing my name below, I, Georgette Shell, attest that this documentation has been prepared under the direction and in the presence of [facility], PA-C. Electronically Signed: Georgette Shell, ED Scribe. 01/26/16. 11:42 AM.  History   Chief Complaint Chief Complaint  Patient presents with  . Post-op Problem    pt states that a surgical instrument was left in him two years ago has pain from this    HPI Comments: [name] is a 60 y.o. male who presents to the Emergency Department complaining of 8/10, aching abdominal pain onset one day ago. Pt states that he has a syringe in his abdomen that was left in him over two years ago from a surgery and reports that this was the source of his pain. No alleviating factors noted. Pt had an abdomen CT done on 09/10/15 which did not show a foreign body but he notes that "somebody is lying because it's in there". He has had multiple endoscopies showing no foreign body as well. He denies nausea, vomiting, fever, diarrhea, constipation, chest pain, shortness of breath or any other associated symptoms.   The history is provided by the patient. No language interpreter was used.    Past Medical History:  Diagnosis Date  . ALLERGIC RHINITIS 01/25/2007  . ANXIETY DISORDER, GENERALIZED 01/25/2007  . Arthritis   . DEPRESSION 01/25/2007  . ESOPHAGITIS 12/28/2007  . Fatty liver 10/09/2010  . GERD 01/25/2007  . GLUCOSE INTOLERANCE, HX OF 01/25/2007  . Heart murmur    hx of   . HEMORRHOIDS, INTERNAL, WITH BLEEDING 04/26/2008  . Hiatal hernia   . HYPERLIPIDEMIA 12/27/2007  . HYPERTENSION, ESSENTIAL NOS 01/25/2007  . Hyperthyroidism 08/11/2010  . HYPERTROPHY PROSTATE W/UR OBST & OTH LUTS 02/27/2010  . 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 LOW BACK PAIN, CHRONIC 11/08/2007  . Pancreatitis   . Rectal abscess   . 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 SCOLIOSIS NEC 01/25/2007  .  Sebaceous cyst 08/10/2010  . SMOKER 08/05/2009  . UPPER GASTROINTESTINAL HEMORRHAGE 01/25/2007    Patient Active Problem List   Diagnosis Date Noted  . Pain in joint, shoulder region 04/23/2015  . Periumbilical abdominal pain 02/13/2015  . Elevated PSA 08/04/2014  . Subacromial bursitis 04/11/2014  . Neck pain on right side 03/05/2014  . Bilateral shoulder pain 03/05/2014  . Recurrent boils 03/05/2014  . Foreign body in stomach 12/31/2013  . Reflux esophagitis 12/31/2013  . Weight loss 05/06/2012  . Incisional hernia 11/23/2011  . Cramp of limb 01/13/2011  . Hyperthyroidism 08/11/2010  . Sebaceous cyst 08/10/2010  . HYPERTROPHY PROSTATE W/UR OBST & OTH LUTS 02/27/2010  . SMOKER 08/05/2009  . 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 HEMORRHOIDS, INTERNAL, WITH BLEEDING 04/26/2008  . HYPERLIPIDEMIA 12/27/2007  . LOW BACK PAIN, CHRONIC 11/08/2007  . 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 ANXIETY DISORDER, GENERALIZED 01/25/2007  . DEPRESSION 01/25/2007  . Essential hypertension 01/25/2007  . ALLERGIC RHINITIS 01/25/2007  . SCOLIOSIS NEC 01/25/2007  . Impaired glucose tolerance 01/25/2007    Past Surgical History:  Procedure Laterality Date  . ABDOMINAL EXPLORATION SURGERY  1988  . CHOLECYSTECTOMY  05/20/08   Dr. Zella Richer  . COLONOSCOPY  04/23/2010   normal  . ESOPHAGOGASTRODUODENOSCOPY N/A 12/31/2013   Procedure: ESOPHAGOGASTRODUODENOSCOPY (EGD);  Surgeon: Gatha Mayer, MD;  Location: [facility];  Service: Endoscopy;  Laterality: N/A;  . Excision of scalp lesion  07/2009  .  Excision of thyroid mass    . GANGLION CYST EXCISION     right foot x 2  . HERNIA REPAIR     ventral  . INCISIONAL HERNIA REPAIR  12/01/2011   Procedure: LAPAROSCOPIC INCISIONAL HERNIA;  Surgeon: Harl Bowie, MD;  Location: [facility];  Service: General;  Laterality: N/A;  . SHOULDER SURGERY     right       Home Medications    Prior to Admission medications   Medication Sig Start Date  End Date Taking? Authorizing Provider  acetaminophen (TYLENOL) 500 MG tablet Take 1,000 mg by mouth 2 (two) times daily.    Historical Provider, MD  alum & mag hydroxide-simeth (MAALOX/MYLANTA) 200-200-20 MG/5ML suspension Take 30 mLs by mouth every 6 (six) hours as needed for indigestion or heartburn (for upset stomach).    Historical Provider, MD  amLODipine (NORVASC) 10 MG tablet Take 1 tablet (10 mg total) by mouth daily. 07/02/15   Cassandria Anger, MD  calcium carbonate (TUMS - DOSED IN MG ELEMENTAL CALCIUM) 500 MG chewable tablet Chew 2 tablets by mouth daily as needed for indigestion or heartburn (for upset stomach).    Historical Provider, MD  dicyclomine (BENTYL) 10 MG capsule Take 2 capsules (20 mg total) by mouth 4 (four) times daily as needed for spasms. 09/11/15   Nicole [name], PA-C  esomeprazole (NEXIUM) 20 MG capsule Take 40 mg by mouth daily at 12 noon.    Historical Provider, MD  fluPHENAZine (PROLIXIN) 10 MG tablet Take 10 mg by mouth at bedtime.    Historical Provider, MD  meloxicam (MOBIC) 15 MG tablet Use qd pc prn arthritis Patient taking differently: Take 15 mg by mouth daily as needed for pain.  04/23/15   Cassandria Anger, MD    Family History Family History  Problem Relation Age of Onset  . Hypertension Father   . Hypertension Mother   . Prostate cancer Other   . Colon cancer Neg Hx     Social History Social History  Substance Use Topics  . Smoking status: Current Every Day Smoker    Packs/day: 1.50    Years: 30.00    Types: Cigarettes  . Smokeless tobacco: Never Used  . Alcohol use No     Allergies   Celecoxib; Iohexol; Metoclopramide hcl; and Wellbutrin [bupropion]   Review of Systems Review of Systems  Constitutional: Negative for fever.  Respiratory: Negative for shortness of breath.   Cardiovascular: Negative for chest pain.  Gastrointestinal: Positive for abdominal pain. Negative for constipation, diarrhea, nausea and vomiting.      Physical Exam Updated Vital Signs BP (!) 174/110 (BP Location: Left Arm)   Pulse 107   Temp 98.5 F (36.9 C) (Oral)   Resp 16   Ht 5\' 9"  (1.753 m)   Wt 102.1 kg   SpO2 99%   BMI 33.23 kg/m   Physical Exam  Constitutional: He is oriented to person, place, and time. He appears well-developed and well-nourished. No distress.  HENT:  Head: Normocephalic and atraumatic.  Cardiovascular: Normal rate, regular rhythm and normal heart sounds.   No murmur heard. Pulmonary/Chest: Effort normal and breath sounds normal. No respiratory distress.  Abdominal: Soft. Bowel sounds are normal.  No abdominal tenderness.   Musculoskeletal: He exhibits no edema.  Neurological: He is alert and oriented to person, place, and time.  Skin: Skin is warm and dry.  Nursing note and vitals reviewed.    ED Treatments / Results  DIAGNOSTIC STUDIES: Oxygen  Saturation is 99% on RA, normal by my interpretation.    COORDINATION OF CARE: 11:29 AM Discussed treatment plan with pt at bedside and pt agreed to plan.  Labs (all labs ordered are listed, but only abnormal results are displayed) Labs Reviewed - No data to display  EKG  EKG Interpretation None       Radiology No results found.  Procedures Procedures (including critical care time)  Medications Ordered in ED Medications - No data to display   Initial Impression / Assessment and Plan / ED Course  I have reviewed the triage vital signs and the nursing notes.  Pertinent labs & imaging results that were available during my care of the patient were reviewed by me and considered in my medical decision making (see chart for details).  Clinical Course   [name] is a 60 y.o. male who presents to ED stating that he has a syringe in his upper abdomen that was left after a surgery two years ago. He has had an endoscopy with no FB seen as well as a CT scan in May 2017 with no foreign body seen. He has a nonsurgical abdomen on exam.  When informed there was no foreign body seen on these images, patient became very upset stating "everyone lies" and "it is in there and everyone is covering it up". He is seen by [facility] but states he does not want to go back to them because they told him the syringe was not in him. [facility] information given. Offered x-ray but patient declines stating he wants "surgery to get this syringe out and nothing else" I informed him that surgery today from the Emergency Department would not be possible and he now wants to be discharged. Stable for discharge.   Final Clinical Impressions(s) / ED Diagnoses   Final diagnoses:  Visit for wound check   I personally performed the services described in this documentation, which was scribed in my presence. The recorded information has been reviewed and is accurate.   New Prescriptions Discharge Medication List as of 01/26/2016 11:30 AM       Ozella Almond Ward, PA-C 01/26/16 [place], MD 01/27/16 [id number]

## 2016-03-02 ENCOUNTER — Encounter (HOSPITAL_COMMUNITY): Payer: Self-pay | Admitting: Emergency Medicine

## 2016-03-02 ENCOUNTER — Emergency Department (HOSPITAL_COMMUNITY)
Admission: EM | Admit: 2016-03-02 | Discharge: 2016-03-03 | Discharge status: Against Medical Advice | Payer: BLUE CROSS/BLUE SHIELD | Attending: Emergency Medicine | Admitting: Emergency Medicine

## 2016-03-02 DIAGNOSIS — Z8546 Personal history of malignant neoplasm of prostate: Secondary | ICD-10-CM | POA: Diagnosis not present

## 2016-03-02 DIAGNOSIS — Z79899 Other long term (current) drug therapy: Secondary | ICD-10-CM | POA: Diagnosis not present

## 2016-03-02 DIAGNOSIS — R1010 Upper abdominal pain, unspecified: Secondary | ICD-10-CM | POA: Diagnosis present

## 2016-03-02 DIAGNOSIS — R1013 Epigastric pain: Secondary | ICD-10-CM

## 2016-03-02 DIAGNOSIS — I10 Essential (primary) hypertension: Secondary | ICD-10-CM | POA: Diagnosis not present

## 2016-03-02 DIAGNOSIS — F17210 Nicotine dependence, cigarettes, uncomplicated: Secondary | ICD-10-CM | POA: Insufficient documentation

## 2016-03-02 DIAGNOSIS — K219 Gastro-esophageal reflux disease without esophagitis: Secondary | ICD-10-CM | POA: Insufficient documentation

## 2016-03-02 LAB — CBC WITH DIFFERENTIAL/PLATELET
BASOS ABS: 0 10*3/uL (ref 0.0–0.1)
Basophils Relative: 0 %
EOS PCT: 2 %
Eosinophils Absolute: 0.2 10*3/uL (ref 0.0–0.7)
HCT: 42 % (ref 39.0–52.0)
Hemoglobin: 15.3 g/dL (ref 13.0–17.0)
LYMPHS PCT: 41 %
Lymphs Abs: 4 10*3/uL (ref 0.7–4.0)
MCH: 30.8 pg (ref 26.0–34.0)
MCHC: 36.4 g/dL — ABNORMAL HIGH (ref 30.0–36.0)
MCV: 84.5 fL (ref 78.0–100.0)
Monocytes Absolute: 0.8 10*3/uL (ref 0.1–1.0)
Monocytes Relative: 8 %
Neutro Abs: 4.8 10*3/uL (ref 1.7–7.7)
Neutrophils Relative %: 49 %
PLATELETS: 314 10*3/uL (ref 150–400)
RBC: 4.97 MIL/uL (ref 4.22–5.81)
RDW: 13.7 % (ref 11.5–15.5)
WBC: 9.8 10*3/uL (ref 4.0–10.5)

## 2016-03-02 MED ORDER — GI COCKTAIL ~~LOC~~: 30.0000 mL | Freq: Once | ORAL | Status: DC

## 2016-03-02 MED ORDER — FAMOTIDINE 20 MG PO TABS
20.0000 mg | ORAL_TABLET | Freq: Once | ORAL | Status: DC
Start: 2016-03-02 — End: 2016-03-03

## 2016-03-02 NOTE — ED Triage Notes (Signed)
Pt states he is having problems with his bowels and bladder  Pt points to his epigastric area and states "it hurts right here when I try to use the bathroom"  Pt states he has a syringe inside him and it has been there for years and it feels like something is sticking him in that area he pointed to   Pt states he last voided this morning and his last BM was yesterday

## 2016-03-02 NOTE — ED Provider Notes (Signed)
[facility] DEPT Provider Note   CSN: [id number] Arrival date & time: 03/02/16  2055 By signing my name below, I, [name], attest that this documentation has been prepared under the direction and in the presence of No att. providers found. Electronically Signed: Doran [name], ED Scribe. 03/02/16. 11:06 PM.  Time seen 23:06 PM  History   Chief Complaint Chief Complaint  Patient presents with  . Abdominal Pain   The history is provided by the patient. No language interpreter was used.   HPI Comments: [name] is a 60 y.o. male who presents to the Emergency Department with a PMHx of prostate cancer and schizophrenia complaining of intermittent upper abdominal pain that has been ongoing for the past several years but became fed up with it today. Pt also reports difficulty urinating. He has a history of prostate cancer. He was advised to follow-up with his urologist. Pt describes his pain as a burning pain and a pain that is "sticking him." Pt reports that he may have a "syringe" in the location where he is having pain.  He states that he was admitted for the syringe 2 years ago and it sounds like they did endoscopy but didn't find anything. He also states he has a surgical clamp in the same place after having a hernia repair. He states he had an x-ray 2 months ago which showed the syringe was still there. Patient describes epigastric discomfort just above a ventral hernia in the epigastric area. He describes the pain as aching and burning and sometimes he gets acid in his throat. He states it can come on a then on for a few days or can last "a few years". He states he gets worse at night when he lays down. Patient becomes agitated if I ask him if he's on stomach acid medication.  Pt became agitated during the initial encounter and kept emphasizing he had a syringe in his abdomen and that he was tired of the pain it was causing. He presented with a CAT scan image from 2 years ago on  a folded piece of paper to support his claim.   PCP [facility]  Past Medical History:  Diagnosis Date  . ALLERGIC RHINITIS 01/25/2007  . ANXIETY DISORDER, GENERALIZED 01/25/2007  . Arthritis   . DEPRESSION 01/25/2007  . ESOPHAGITIS 12/28/2007  . Fatty liver 10/09/2010  . GERD 01/25/2007  . GLUCOSE INTOLERANCE, HX OF 01/25/2007  . Heart murmur    hx of   . HEMORRHOIDS, INTERNAL, WITH BLEEDING 04/26/2008  . Hiatal hernia   . HYPERLIPIDEMIA 12/27/2007  . HYPERTENSION, ESSENTIAL NOS 01/25/2007  . Hyperthyroidism 08/11/2010  . HYPERTROPHY PROSTATE W/UR OBST & OTH LUTS 02/27/2010  . 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 LOW BACK PAIN, CHRONIC 11/08/2007  . Pancreatitis   . Rectal abscess   . 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 SCOLIOSIS NEC 01/25/2007  . Sebaceous cyst 08/10/2010  . SMOKER 08/05/2009  . UPPER GASTROINTESTINAL HEMORRHAGE 01/25/2007   Patient Active Problem List   Diagnosis Date Noted  . Pain in joint, shoulder region 04/23/2015  . Periumbilical abdominal pain 02/13/2015  . Elevated PSA 08/04/2014  . Subacromial bursitis 04/11/2014  . Neck pain on right side 03/05/2014  . Bilateral shoulder pain 03/05/2014  . Recurrent boils 03/05/2014  . Foreign body in stomach 12/31/2013  . Reflux esophagitis 12/31/2013  . Weight loss 05/06/2012  . Incisional hernia 11/23/2011  . Cramp of limb 01/13/2011  . Hyperthyroidism 08/11/2010  . Sebaceous  cyst 08/10/2010  . HYPERTROPHY PROSTATE W/UR OBST & OTH LUTS 02/27/2010  . SMOKER 08/05/2009  . 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 HEMORRHOIDS, INTERNAL, WITH BLEEDING 04/26/2008  . HYPERLIPIDEMIA 12/27/2007  . LOW BACK PAIN, CHRONIC 11/08/2007  . 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 ANXIETY DISORDER, GENERALIZED 01/25/2007  . DEPRESSION 01/25/2007  . Essential hypertension 01/25/2007  . ALLERGIC RHINITIS 01/25/2007  . SCOLIOSIS NEC 01/25/2007  . Impaired glucose tolerance 01/25/2007    Past Surgical History:   Procedure Laterality Date  . ABDOMINAL EXPLORATION SURGERY  1988  . CHOLECYSTECTOMY  05/20/08   Dr. Zella Richer  . COLONOSCOPY  04/23/2010   normal  . ESOPHAGOGASTRODUODENOSCOPY N/A 12/31/2013   Procedure: ESOPHAGOGASTRODUODENOSCOPY (EGD);  Surgeon: Gatha Mayer, MD;  Location: [facility];  Service: Endoscopy;  Laterality: N/A;  . Excision of scalp lesion  07/2009  . Excision of thyroid mass    . GANGLION CYST EXCISION     right foot x 2  . HERNIA REPAIR     ventral  . INCISIONAL HERNIA REPAIR  12/01/2011   Procedure: LAPAROSCOPIC INCISIONAL HERNIA;  Surgeon: Harl Bowie, MD;  Location: [facility];  Service: General;  Laterality: N/A;  . SHOULDER SURGERY     right    Home Medications    Prior to Admission medications   Medication Sig Start Date End Date Taking? Authorizing Provider  acetaminophen (TYLENOL) 500 MG tablet Take 1,000 mg by mouth 2 (two) times daily.   Yes Historical Provider, MD  amLODipine (NORVASC) 10 MG tablet Take 1 tablet (10 mg total) by mouth daily. 07/02/15  Yes Evie Lacks Plotnikov, MD  esomeprazole (NEXIUM) 20 MG capsule Take 40 mg by mouth daily at 12 noon.   Yes Historical Provider, MD  losartan-hydrochlorothiazide (HYZAAR) 50-12.5 MG tablet Take 1 tablet by mouth daily. 01/27/16  Yes Historical Provider, MD  meloxicam (MOBIC) 15 MG tablet Use qd pc prn arthritis Patient taking differently: Take 15 mg by mouth daily as needed for pain.  04/23/15  Yes Evie Lacks Plotnikov, MD  dicyclomine (BENTYL) 10 MG capsule Take 2 capsules (20 mg total) by mouth 4 (four) times daily as needed for spasms. Patient not taking: Reported on 03/02/2016 09/11/15   Monico Blitz, PA-C   Family History Family History  Problem Relation Age of Onset  . Hypertension Father   . Hypertension Mother   . Prostate cancer Other   . Colon cancer Neg Hx    Social History Social History  Substance Use Topics  . Smoking status: Current Every Day Smoker    Packs/day: 1.50    Years:  30.00    Types: Cigarettes  . Smokeless tobacco: Never Used  . Alcohol use No   Pt smokes a pack of cigarettes a day and does not drink alcohol. Pt is on disability for his "back and schizophrenia."   Allergies   Celecoxib; Iohexol; Metoclopramide hcl; and Wellbutrin [bupropion]   Review of Systems Review of Systems  Unable to perform ROS: Psychiatric disorder  All other systems reviewed and are negative.    Physical Exam Updated Vital Signs BP 122/74 (BP Location: Left Arm)   Pulse 96   Temp 98.4 F (36.9 C) (Oral)   Resp 18   SpO2 99%   Vital signs normal    Physical Exam  Constitutional: He is oriented to person, place, and time. He appears well-developed and well-nourished.  Non-toxic appearance. He does not appear ill. No distress.  HENT:  Head: Normocephalic and  atraumatic.  Right Ear: External ear normal.  Left Ear: External ear normal.  Nose: Nose normal. No mucosal edema or rhinorrhea.  Mouth/Throat: Oropharynx is clear and moist and mucous membranes are normal. No dental abscesses or uvula swelling.  Eyes: Conjunctivae and EOM are normal. Pupils are equal, round, and reactive to light.  Neck: Normal range of motion and full passive range of motion without pain. Neck supple.  Cardiovascular: Normal rate, regular rhythm and normal heart sounds.  Exam reveals no gallop and no friction rub.   No murmur heard. Pulmonary/Chest: Effort normal and breath sounds normal. No respiratory distress. He has no wheezes. He has no rhonchi. He has no rales. He exhibits no tenderness and no crepitus.  Abdominal: Soft. Normal appearance and bowel sounds are normal. He exhibits no distension. There is no tenderness. There is no rebound and no guarding.  Hernia in the epigastric region of the abdomen the size of a hand that is palpable when he strains.   Musculoskeletal: Normal range of motion. He exhibits no edema or tenderness.  Moves all extremities well.   Neurological: He is  alert and oriented to person, place, and time. He has normal strength. No cranial nerve deficit.  Skin: Skin is warm, dry and intact. No rash noted. No erythema. No pallor.  Psychiatric: His mood appears not anxious. His affect is labile. His speech is rapid and/or pressured. He is agitated. Thought content is delusional.  Nursing note and vitals reviewed.  ED Treatments / Results  DIAGNOSTIC STUDIES: Oxygen Saturation is 99% on room air, normal by my interpretation.    COORDINATION OF CARE: 11:06 PM Discussed treatment plan with pt at bedside and pt agreed to plan.  Labs (all labs ordered are listed, but only abnormal results are displayed) Results for orders placed or performed during the hospital encounter of 03/02/16  Comprehensive metabolic panel  Result Value Ref Range   Sodium 136 135 - 145 mmol/L   Potassium 2.7 (LL) 3.5 - 5.1 mmol/L   Chloride 97 (L) 101 - 111 mmol/L   CO2 28 22 - 32 mmol/L   Glucose, Bld 109 (H) 65 - 99 mg/dL   BUN 5 (L) 6 - 20 mg/dL   Creatinine, Ser 1.40 (H) 0.61 - 1.24 mg/dL   Calcium 9.5 8.9 - 10.3 mg/dL   Total Protein 7.1 6.5 - 8.1 g/dL   Albumin 4.1 3.5 - 5.0 g/dL   AST 17 15 - 41 U/L   ALT 14 (L) 17 - 63 U/L   Alkaline Phosphatase 65 38 - 126 U/L   Total Bilirubin 0.9 0.3 - 1.2 mg/dL   GFR calc non Af Amer 54 (L) >60 mL/min   GFR calc Af Amer >60 >60 mL/min   Anion gap 11 5 - 15  Lipase, blood  Result Value Ref Range   Lipase 20 11 - 51 U/L  CBC with Differential  Result Value Ref Range   WBC 9.8 4.0 - 10.5 K/uL   RBC 4.97 4.22 - 5.81 MIL/uL   Hemoglobin 15.3 13.0 - 17.0 g/dL   HCT 42.0 39.0 - 52.0 %   MCV 84.5 78.0 - 100.0 fL   MCH 30.8 26.0 - 34.0 pg   MCHC 36.4 (H) 30.0 - 36.0 g/dL   RDW 13.7 11.5 - 15.5 %   Platelets 314 150 - 400 K/uL   Neutrophils Relative % 49 %   Neutro Abs 4.8 1.7 - 7.7 K/uL   Lymphocytes Relative 41 %  Lymphs Abs 4.0 0.7 - 4.0 K/uL   Monocytes Relative 8 %   Monocytes Absolute 0.8 0.1 - 1.0 K/uL    Eosinophils Relative 2 %   Eosinophils Absolute 0.2 0.0 - 0.7 K/uL   Basophils Relative 0 %   Basophils Absolute 0.0 0.0 - 0.1 K/uL  Urinalysis, Routine w reflex microscopic  Result Value Ref Range   Color, Urine YELLOW YELLOW   APPearance CLEAR CLEAR   Specific Gravity, Urine 1.004 (L) 1.005 - 1.030   pH 6.0 5.0 - 8.0   Glucose, UA NEGATIVE NEGATIVE mg/dL   Hgb urine dipstick NEGATIVE NEGATIVE   Bilirubin Urine NEGATIVE NEGATIVE   Ketones, ur NEGATIVE NEGATIVE mg/dL   Protein, ur NEGATIVE NEGATIVE mg/dL   Nitrite NEGATIVE NEGATIVE   Leukocytes, UA NEGATIVE NEGATIVE   Laboratory interpretation all normal except Hypokalemia and stable renal insufficiency    EKG  EKG Interpretation None       Radiology No results found.   Sep 10, 2015 CLINICAL DATA:  Acute onset of generalized abdominal pain. Initial encounter.  EXAM: CT ABDOMEN AND PELVIS WITH CONTRAST  TECHNIQUE: Multidetector CT imaging of the abdomen and pelvis was performed using the standard protocol following bolus administration of intravenous contrast.  COMPARISON:  CT of the abdomen and pelvis performed 12/31/2013   IMPRESSION: 1. No acute abnormality seen within the abdomen or pelvis. 2. Scattered calcification along the abdominal aorta and its branches. 3. Small anterior abdominal wall hernia at the upper mid abdomen, containing only fat. 4. Enlarged prostate noted. 5. 1.5 cm subcutaneous cystic focus anterior to the right hip.   Electronically Signed   By: Garald Balding M.D.   On: 09/10/2015 23:22   Procedures Procedures (including critical care time)  Medications Ordered in ED Medications - No data to display   Initial Impression / Assessment and Plan / ED Course  I have reviewed the triage vital signs and the nursing notes.  Pertinent labs & imaging results that were available during my care of the patient were reviewed by me and considered in my medical decision making (see  chart for details).  Clinical Course    GI cocktail and oral Pepcid was ordered for the patient. His symptoms seem to be more suggestive of GERD. I reviewed patient's CT scan done in May which does not describe any intra- intestinal foreign body. Patient seems to be having a extreme focus on this syringe in his abdomen that he cannot explain how it got there. I suspect patient is having delusions from his schizophrenia. He does not appear to have any reason to have a psychiatric evaluation. There was a visitor in the room with him and she did not seem concerned.  Oral potassium was ordered for the patient however he did not take it.  Nurses report patient left AMA and did not take the medication ordered.   Final Clinical Impressions(s) / ED Diagnoses   Final diagnoses:  Epigastric abdominal pain  Gastroesophageal reflux disease without esophagitis   Patient left AMA  Rolland Porter, MD, FACEP  I personally performed the services described in this documentation, which was scribed in my presence. The recorded information has been reviewed and considered.  Rolland Porter, MD, Barbette Or, MD 03/03/16 [phone]

## 2016-03-03 LAB — URINALYSIS, ROUTINE W REFLEX MICROSCOPIC
Bilirubin Urine: NEGATIVE
GLUCOSE, UA: NEGATIVE mg/dL
Hgb urine dipstick: NEGATIVE
Ketones, ur: NEGATIVE mg/dL
LEUKOCYTES UA: NEGATIVE
Nitrite: NEGATIVE
PROTEIN: NEGATIVE mg/dL
SPECIFIC GRAVITY, URINE: 1.004 — AB (ref 1.005–1.030)
pH: 6 (ref 5.0–8.0)

## 2016-03-03 LAB — COMPREHENSIVE METABOLIC PANEL
ALT: 14 U/L — ABNORMAL LOW (ref 17–63)
ANION GAP: 11 (ref 5–15)
AST: 17 U/L (ref 15–41)
Albumin: 4.1 g/dL (ref 3.5–5.0)
Alkaline Phosphatase: 65 U/L (ref 38–126)
BUN: 5 mg/dL — ABNORMAL LOW (ref 6–20)
CHLORIDE: 97 mmol/L — AB (ref 101–111)
CO2: 28 mmol/L (ref 22–32)
CREATININE: 1.4 mg/dL — AB (ref 0.61–1.24)
Calcium: 9.5 mg/dL (ref 8.9–10.3)
GFR, EST NON AFRICAN AMERICAN: 54 mL/min — AB (ref >60)
Glucose, Bld: 109 mg/dL — ABNORMAL HIGH (ref 65–99)
POTASSIUM: 2.7 mmol/L — AB (ref 3.5–5.1)
Sodium: 136 mmol/L (ref 135–145)
Total Bilirubin: 0.9 mg/dL (ref 0.3–1.2)
Total Protein: 7.1 g/dL (ref 6.5–8.1)

## 2016-03-03 LAB — LIPASE, BLOOD: LIPASE: 20 U/L (ref 11–51)

## 2016-03-03 MED ORDER — POTASSIUM CHLORIDE CRYS ER 20 MEQ PO TBCR: 40.0000 meq | EXTENDED_RELEASE_TABLET | Freq: Once | ORAL | Status: DC

## 2016-03-03 NOTE — ED Notes (Signed)
Pt seen walking out of department; pt encouraged to stay; pt declined; MD notified

## 2016-03-03 NOTE — ED Notes (Signed)
RN and MD notified of critical potassium

## 2016-04-28 DIAGNOSIS — I10 Essential (primary) hypertension: Secondary | ICD-10-CM | POA: Diagnosis not present

## 2016-04-28 DIAGNOSIS — E139 Other specified diabetes mellitus without complications: Secondary | ICD-10-CM | POA: Diagnosis not present

## 2016-04-28 DIAGNOSIS — C61 Malignant neoplasm of prostate: Secondary | ICD-10-CM | POA: Diagnosis not present

## 2016-04-29 DIAGNOSIS — C61 Malignant neoplasm of prostate: Secondary | ICD-10-CM | POA: Diagnosis not present

## 2016-06-11 DIAGNOSIS — C61 Malignant neoplasm of prostate: Secondary | ICD-10-CM | POA: Diagnosis not present

## 2016-06-11 DIAGNOSIS — E139 Other specified diabetes mellitus without complications: Secondary | ICD-10-CM | POA: Diagnosis not present

## 2016-06-11 DIAGNOSIS — I10 Essential (primary) hypertension: Secondary | ICD-10-CM | POA: Diagnosis not present

## 2016-07-12 DIAGNOSIS — M47812 Spondylosis without myelopathy or radiculopathy, cervical region: Secondary | ICD-10-CM | POA: Diagnosis not present

## 2016-07-12 DIAGNOSIS — M50321 Other cervical disc degeneration at C4-C5 level: Secondary | ICD-10-CM | POA: Diagnosis not present

## 2016-07-14 DIAGNOSIS — I10 Essential (primary) hypertension: Secondary | ICD-10-CM | POA: Diagnosis not present

## 2016-07-14 DIAGNOSIS — E139 Other specified diabetes mellitus without complications: Secondary | ICD-10-CM | POA: Diagnosis not present

## 2016-07-27 DIAGNOSIS — C61 Malignant neoplasm of prostate: Secondary | ICD-10-CM | POA: Diagnosis not present

## 2016-08-03 DIAGNOSIS — R3916 Straining to void: Secondary | ICD-10-CM | POA: Diagnosis not present

## 2016-08-03 DIAGNOSIS — R3912 Poor urinary stream: Secondary | ICD-10-CM | POA: Diagnosis not present

## 2016-08-03 DIAGNOSIS — C61 Malignant neoplasm of prostate: Secondary | ICD-10-CM | POA: Diagnosis not present

## 2016-08-18 DIAGNOSIS — E139 Other specified diabetes mellitus without complications: Secondary | ICD-10-CM | POA: Diagnosis not present

## 2016-08-18 DIAGNOSIS — I10 Essential (primary) hypertension: Secondary | ICD-10-CM | POA: Diagnosis not present

## 2016-09-04 DIAGNOSIS — R1010 Upper abdominal pain, unspecified: Secondary | ICD-10-CM | POA: Diagnosis not present

## 2016-09-04 DIAGNOSIS — R1013 Epigastric pain: Secondary | ICD-10-CM | POA: Diagnosis not present

## 2016-09-04 DIAGNOSIS — I700 Atherosclerosis of aorta: Secondary | ICD-10-CM | POA: Diagnosis not present

## 2016-09-04 DIAGNOSIS — K439 Ventral hernia without obstruction or gangrene: Secondary | ICD-10-CM | POA: Diagnosis not present

## 2016-09-04 DIAGNOSIS — R112 Nausea with vomiting, unspecified: Secondary | ICD-10-CM | POA: Diagnosis not present

## 2016-09-04 DIAGNOSIS — R1111 Vomiting without nausea: Secondary | ICD-10-CM | POA: Diagnosis not present

## 2016-09-04 DIAGNOSIS — N400 Enlarged prostate without lower urinary tract symptoms: Secondary | ICD-10-CM | POA: Diagnosis not present

## 2016-09-04 DIAGNOSIS — K529 Noninfective gastroenteritis and colitis, unspecified: Secondary | ICD-10-CM | POA: Diagnosis not present

## 2016-09-04 DIAGNOSIS — Z9049 Acquired absence of other specified parts of digestive tract: Secondary | ICD-10-CM | POA: Diagnosis not present

## 2016-09-06 ENCOUNTER — Emergency Department (HOSPITAL_COMMUNITY)
Admission: EM | Admit: 2016-09-06 | Discharge: 2016-09-06 | Disposition: A | Discharge status: Home / Self Care | Payer: BLUE CROSS/BLUE SHIELD | Attending: Emergency Medicine | Admitting: Emergency Medicine

## 2016-09-06 ENCOUNTER — Encounter (HOSPITAL_COMMUNITY): Payer: Self-pay

## 2016-09-06 DIAGNOSIS — I10 Essential (primary) hypertension: Secondary | ICD-10-CM | POA: Insufficient documentation

## 2016-09-06 DIAGNOSIS — R110 Nausea: Secondary | ICD-10-CM

## 2016-09-06 DIAGNOSIS — R1010 Upper abdominal pain, unspecified: Secondary | ICD-10-CM | POA: Diagnosis present

## 2016-09-06 DIAGNOSIS — K450 Other specified abdominal hernia with obstruction, without gangrene: Secondary | ICD-10-CM | POA: Diagnosis not present

## 2016-09-06 DIAGNOSIS — R112 Nausea with vomiting, unspecified: Secondary | ICD-10-CM | POA: Diagnosis not present

## 2016-09-06 DIAGNOSIS — R109 Unspecified abdominal pain: Secondary | ICD-10-CM | POA: Diagnosis not present

## 2016-09-06 DIAGNOSIS — R1013 Epigastric pain: Secondary | ICD-10-CM

## 2016-09-06 DIAGNOSIS — F17210 Nicotine dependence, cigarettes, uncomplicated: Secondary | ICD-10-CM | POA: Diagnosis not present

## 2016-09-06 DIAGNOSIS — K439 Ventral hernia without obstruction or gangrene: Secondary | ICD-10-CM | POA: Diagnosis not present

## 2016-09-06 DIAGNOSIS — Z79899 Other long term (current) drug therapy: Secondary | ICD-10-CM | POA: Diagnosis not present

## 2016-09-06 DIAGNOSIS — K469 Unspecified abdominal hernia without obstruction or gangrene: Secondary | ICD-10-CM | POA: Diagnosis not present

## 2016-09-06 LAB — CBC WITH DIFFERENTIAL/PLATELET
BASOS ABS: 0 10*3/uL (ref 0.0–0.1)
BASOS PCT: 0 %
EOS PCT: 8 %
Eosinophils Absolute: 0.8 10*3/uL — ABNORMAL HIGH (ref 0.0–0.7)
HEMATOCRIT: 41.6 % (ref 39.0–52.0)
Hemoglobin: 14.4 g/dL (ref 13.0–17.0)
Lymphocytes Relative: 28 %
Lymphs Abs: 2.8 10*3/uL (ref 0.7–4.0)
MCH: 30.1 pg (ref 26.0–34.0)
MCHC: 34.6 g/dL (ref 30.0–36.0)
MCV: 87 fL (ref 78.0–100.0)
MONO ABS: 0.6 10*3/uL (ref 0.1–1.0)
Monocytes Relative: 6 %
NEUTROS ABS: 5.7 10*3/uL (ref 1.7–7.7)
Neutrophils Relative %: 58 %
PLATELETS: 308 10*3/uL (ref 150–400)
RBC: 4.78 MIL/uL (ref 4.22–5.81)
RDW: 14 % (ref 11.5–15.5)
WBC: 9.9 10*3/uL (ref 4.0–10.5)

## 2016-09-06 LAB — COMPREHENSIVE METABOLIC PANEL
ALBUMIN: 4.1 g/dL (ref 3.5–5.0)
ALT: 14 U/L — ABNORMAL LOW (ref 17–63)
AST: 16 U/L (ref 15–41)
Alkaline Phosphatase: 70 U/L (ref 38–126)
Anion gap: 9 (ref 5–15)
BUN: 13 mg/dL (ref 6–20)
CHLORIDE: 99 mmol/L — AB (ref 101–111)
CO2: 30 mmol/L (ref 22–32)
Calcium: 9.1 mg/dL (ref 8.9–10.3)
Creatinine, Ser: 1.42 mg/dL — ABNORMAL HIGH (ref 0.61–1.24)
GFR calc Af Amer: >60 mL/min (ref >60)
GFR calc non Af Amer: 53 mL/min — ABNORMAL LOW (ref >60)
GLUCOSE: 101 mg/dL — AB (ref 65–99)
POTASSIUM: 3.3 mmol/L — AB (ref 3.5–5.1)
Sodium: 138 mmol/L (ref 135–145)
Total Bilirubin: 0.4 mg/dL (ref 0.3–1.2)
Total Protein: 7.2 g/dL (ref 6.5–8.1)

## 2016-09-06 LAB — I-STAT CG4 LACTIC ACID, ED: LACTIC ACID, VENOUS: 1.17 mmol/L (ref 0.5–1.9)

## 2016-09-06 LAB — LIPASE, BLOOD: Lipase: 29 U/L (ref 11–51)

## 2016-09-06 MED ORDER — HYDROMORPHONE HCL 1 MG/ML IJ SOLN
1.0000 mg | Freq: Once | INTRAMUSCULAR | Status: AC
  Administered 2016-09-06: 1 mg via INTRAMUSCULAR
  Filled 2016-09-06: qty 1

## 2016-09-06 MED ORDER — ONDANSETRON 8 MG PO TBDP
8.0000 mg | ORAL_TABLET | Freq: Once | ORAL | Status: AC
  Administered 2016-09-06: 8 mg via ORAL
  Filled 2016-09-06: qty 1

## 2016-09-06 NOTE — ED Notes (Signed)
Bed: BP10 Expected date:  Expected time:  Means of arrival:  Comments: EMS 61 yo, abd pain

## 2016-09-06 NOTE — ED Triage Notes (Signed)
Per EMS- Patient was at the PCP office today due to increased pain from abdominal hernia and N/V.[name] Patient has a 3 year history of the same. Patient had a scheduled surgical consult in June and PCP wanted the patient seen before then.  Patient was given Toradol 60 mg IM prior to EMS arrival to the PCP office (1100).

## 2016-09-06 NOTE — ED Provider Notes (Signed)
[name] DEPT Provider Note   CSN: [id number] Arrival date & time: 09/06/16  1147     History   Chief Complaint Chief Complaint  Patient presents with  . Abdominal Pain    HPI [name] is a 61 y.o. male.  HPI  Patient presents with concern of upper abdominal pain. He has known ventral hernia, has had prior repair, revision. He now states that about 4 days ago, after reaching over a railing, with subsequent development of pain, he has had ongoing pain. Pain is focally the upper abdomen, nonradiating, sore. He has had some associated nausea, but no loss of bowel movements, no fever, no chills, no other chest pain or dyspnea. Patient went to another facility 2 days ago due to these concerns, was evaluated with CT scan. Patient now presents due to concern of ongoing discomfort, after being referred by his physician due to concern of involvement of his hernia.  Past Medical History:  Diagnosis Date  . ALLERGIC RHINITIS 01/25/2007  . ANXIETY DISORDER, GENERALIZED 01/25/2007  . Arthritis   . DEPRESSION 01/25/2007  . ESOPHAGITIS 12/28/2007  . Fatty liver 10/09/2010  . GERD 01/25/2007  . GLUCOSE INTOLERANCE, HX OF 01/25/2007  . Heart murmur    hx of   . HEMORRHOIDS, INTERNAL, WITH BLEEDING 04/26/2008  . Hiatal hernia   . HYPERLIPIDEMIA 12/27/2007  . HYPERTENSION, ESSENTIAL NOS 01/25/2007  . Hyperthyroidism 08/11/2010  . HYPERTROPHY PROSTATE W/UR OBST & OTH LUTS 02/27/2010  . 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 LOW BACK PAIN, CHRONIC 11/08/2007  . Pancreatitis   . Rectal abscess   . 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 SCOLIOSIS NEC 01/25/2007  . Sebaceous cyst 08/10/2010  . SMOKER 08/05/2009  . UPPER GASTROINTESTINAL HEMORRHAGE 01/25/2007    Patient Active Problem List   Diagnosis Date Noted  . Pain in joint, shoulder region 04/23/2015  . Periumbilical abdominal pain 02/13/2015  . Elevated PSA 08/04/2014  . Subacromial bursitis 04/11/2014  . Neck pain on right  side 03/05/2014  . Bilateral shoulder pain 03/05/2014  . Recurrent boils 03/05/2014  . Foreign body in stomach 12/31/2013  . Reflux esophagitis 12/31/2013  . Weight loss 05/06/2012  . Incisional hernia 11/23/2011  . Cramp of limb 01/13/2011  . Hyperthyroidism 08/11/2010  . Sebaceous cyst 08/10/2010  . HYPERTROPHY PROSTATE W/UR OBST & OTH LUTS 02/27/2010  . SMOKER 08/05/2009  . 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 HEMORRHOIDS, INTERNAL, WITH BLEEDING 04/26/2008  . HYPERLIPIDEMIA 12/27/2007  . LOW BACK PAIN, CHRONIC 11/08/2007  . 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 ANXIETY DISORDER, GENERALIZED 01/25/2007  . DEPRESSION 01/25/2007  . Essential hypertension 01/25/2007  . ALLERGIC RHINITIS 01/25/2007  . SCOLIOSIS NEC 01/25/2007  . Impaired glucose tolerance 01/25/2007    Past Surgical History:  Procedure Laterality Date  . ABDOMINAL EXPLORATION SURGERY  1988  . CHOLECYSTECTOMY  05/20/08   Dr. Zella Richer  . COLONOSCOPY  04/23/2010   normal  . ESOPHAGOGASTRODUODENOSCOPY N/A 12/31/2013   Procedure: ESOPHAGOGASTRODUODENOSCOPY (EGD);  Surgeon: Gatha Mayer, MD;  Location: [facility];  Service: Endoscopy;  Laterality: N/A;  . Excision of scalp lesion  07/2009  . Excision of thyroid mass    . GANGLION CYST EXCISION     right foot x 2  . HERNIA REPAIR     ventral  . INCISIONAL HERNIA REPAIR  12/01/2011   Procedure: LAPAROSCOPIC INCISIONAL HERNIA;  Surgeon: Harl Bowie, MD;  Location: [facility];  Service: General;  Laterality: N/A;  . SHOULDER SURGERY  right       Home Medications    Prior to Admission medications   Medication Sig Start Date End Date Taking? Authorizing Provider  acetaminophen (TYLENOL) 500 MG tablet Take 1,000 mg by mouth 2 (two) times daily.   Yes [provider]  amLODipine (NORVASC) 10 MG tablet Take 1 tablet (10 mg total) by mouth daily. 07/02/15  Yes Plotnikov, Evie Lacks, MD  esomeprazole (NEXIUM) 20 MG capsule Take 40 mg by mouth  daily at 12 noon.   Yes [provider]  losartan-hydrochlorothiazide (HYZAAR) 50-12.5 MG tablet Take 1 tablet by mouth daily. 01/27/16  Yes [provider]  dicyclomine (BENTYL) 10 MG capsule Take 2 capsules (20 mg total) by mouth 4 (four) times daily as needed for spasms. Patient not taking: Reported on 03/02/2016 09/11/15   [name], [name]  meloxicam (MOBIC) 15 MG tablet Use qd pc prn arthritis Patient not taking: Reported on 09/06/2016 04/23/15   Plotnikov, Evie Lacks, MD    Family History Family History  Problem Relation Age of Onset  . Hypertension Father   . Hypertension Mother   . Prostate cancer Other   . Colon cancer Neg Hx     Social History Social History  Substance Use Topics  . Smoking status: Current Every Day Smoker    Packs/day: 1.50    Years: 30.00    Types: Cigarettes  . Smokeless tobacco: Never Used  . Alcohol use No     Allergies   Celecoxib; Iohexol; Metoclopramide hcl; and Wellbutrin [bupropion]   Review of Systems Review of Systems  Constitutional:       Per HPI, otherwise negative  HENT:       Per HPI, otherwise negative  Respiratory:       Per HPI, otherwise negative  Cardiovascular:       Per HPI, otherwise negative  Gastrointestinal: Positive for nausea and vomiting.  Endocrine:       Negative aside from HPI  Genitourinary:       Neg aside from HPI   Musculoskeletal:       Per HPI, otherwise negative  Skin: Negative.   Neurological: Negative for syncope.  Psychiatric/Behavioral: The patient is nervous/anxious.      Physical Exam Updated Vital Signs BP (!) 153/96 (BP Location: Right Arm)   Pulse 83   Temp 97.9 F (36.6 C) (Oral)   Resp 18   Ht 5\' 9"  (1.753 m)   Wt 108 kg (238 lb)   SpO2 100%   BMI 35.15 kg/m   Physical Exam  Constitutional: He is oriented to person, place, and time. He appears well-developed. No distress.  HENT:  Head: Normocephalic and atraumatic.  Eyes: Conjunctivae and EOM are  normal.  Cardiovascular: Normal rate and regular rhythm.   Pulmonary/Chest: Effort normal. No stridor. No respiratory distress.  Abdominal: He exhibits no distension.  Upper mid abdominal wall hernia appreciable, minimal tenderness to palpation. There is a longitudinal oriented scar across the entire abdomen as well.   Musculoskeletal: He exhibits no edema.  Neurological: He is alert and oriented to person, place, and time.  Skin: Skin is warm and dry.  Psychiatric: He has a normal mood and affect.  Nursing note and vitals reviewed.    ED Treatments / Results  Labs (all labs ordered are listed, but only abnormal results are displayed) Labs Reviewed  CBC WITH DIFFERENTIAL/PLATELET - Abnormal; Notable for the following:       Result Value   Eosinophils Absolute 0.8 (*)  All other components within normal limits  COMPREHENSIVE METABOLIC PANEL - Abnormal; Notable for the following:    Potassium 3.3 (*)    Chloride 99 (*)    Glucose, Bld 101 (*)    Creatinine, Ser 1.42 (*)    ALT 14 (*)    GFR calc non Af Amer 53 (*)    All other components within normal limits  LIPASE, BLOOD  I-STAT CG4 LACTIC ACID, ED     Radiology After the initial evaluation I retrieve the images from the patient's evaluation 2 days ago at another facility. Together we reviewed the pictures, and I demonstrated the absence of bowel or other dangerous content in his abdominal wall hernia. I discussed the reassuring aspect of this finding, and given the absence of new complaints, any vomiting, low suspicion for progression of his disease.     Procedures Procedures (including critical care time)  Medications Ordered in ED Medications  HYDROmorphone (DILAUDID) injection 1 mg (1 mg Intramuscular Given 09/06/16 1225)  ondansetron (ZOFRAN-ODT) disintegrating tablet 8 mg (8 mg Oral Given 09/06/16 1224)     Initial Impression / Assessment and Plan / ED Course  I have reviewed the triage vital signs and the  nursing notes.  Pertinent labs & imaging results that were available during my care of the patient were reviewed by me and considered in my medical decision making (see chart for details).  Now, on repeat exam patient awake and alert, calm, states that he feels better. We discussed all findings again, with the patient's wife present. With no evidence for entrapment, strangulation, incarceration, the patient was discharged in stable condition with outpatient follow-up.   Final Clinical Impressions(s) / ED Diagnoses  Abdominal pain Ventral hernia Nausea   Carmin Muskrat, MD 09/06/16 1324

## 2016-09-06 NOTE — Discharge Instructions (Signed)
As discussed, your evaluation today has been largely reassuring.  But, it is important that you monitor your condition carefully, and do not hesitate to return to the ED if you develop new, or concerning changes in your condition. ? ?Otherwise, please follow-up with your physician for appropriate ongoing care. ? ?

## 2016-09-12 ENCOUNTER — Encounter (HOSPITAL_COMMUNITY): Payer: Self-pay | Admitting: Emergency Medicine

## 2016-09-12 ENCOUNTER — Emergency Department (HOSPITAL_COMMUNITY): Payer: BLUE CROSS/BLUE SHIELD

## 2016-09-12 ENCOUNTER — Emergency Department (HOSPITAL_COMMUNITY)
Admission: EM | Admit: 2016-09-12 | Discharge: 2016-09-12 | Disposition: A | Discharge status: Home / Self Care | Payer: BLUE CROSS/BLUE SHIELD | Attending: Emergency Medicine | Admitting: Emergency Medicine

## 2016-09-12 DIAGNOSIS — I10 Essential (primary) hypertension: Secondary | ICD-10-CM | POA: Diagnosis not present

## 2016-09-12 DIAGNOSIS — Z79899 Other long term (current) drug therapy: Secondary | ICD-10-CM | POA: Diagnosis not present

## 2016-09-12 DIAGNOSIS — R1013 Epigastric pain: Secondary | ICD-10-CM | POA: Insufficient documentation

## 2016-09-12 DIAGNOSIS — R112 Nausea with vomiting, unspecified: Secondary | ICD-10-CM | POA: Diagnosis not present

## 2016-09-12 DIAGNOSIS — K439 Ventral hernia without obstruction or gangrene: Secondary | ICD-10-CM | POA: Diagnosis not present

## 2016-09-12 DIAGNOSIS — F17210 Nicotine dependence, cigarettes, uncomplicated: Secondary | ICD-10-CM | POA: Insufficient documentation

## 2016-09-12 DIAGNOSIS — R1011 Right upper quadrant pain: Secondary | ICD-10-CM | POA: Diagnosis not present

## 2016-09-12 DIAGNOSIS — E039 Hypothyroidism, unspecified: Secondary | ICD-10-CM | POA: Diagnosis not present

## 2016-09-12 LAB — CBC WITH DIFFERENTIAL/PLATELET
BASOS ABS: 0 10*3/uL (ref 0.0–0.1)
BASOS PCT: 0 %
Eosinophils Absolute: 0.4 10*3/uL (ref 0.0–0.7)
Eosinophils Relative: 4 %
HEMATOCRIT: 45.1 % (ref 39.0–52.0)
Hemoglobin: 15.3 g/dL (ref 13.0–17.0)
Lymphocytes Relative: 12 %
Lymphs Abs: 1.4 10*3/uL (ref 0.7–4.0)
MCH: 29.7 pg (ref 26.0–34.0)
MCHC: 33.9 g/dL (ref 30.0–36.0)
MCV: 87.6 fL (ref 78.0–100.0)
MONO ABS: 0.7 10*3/uL (ref 0.1–1.0)
Monocytes Relative: 6 %
Neutro Abs: 9.1 10*3/uL — ABNORMAL HIGH (ref 1.7–7.7)
Neutrophils Relative %: 78 %
PLATELETS: 352 10*3/uL (ref 150–400)
RBC: 5.15 MIL/uL (ref 4.22–5.81)
RDW: 14 % (ref 11.5–15.5)
WBC: 11.7 10*3/uL — AB (ref 4.0–10.5)

## 2016-09-12 LAB — I-STAT CHEM 8, ED
BUN: 8 mg/dL (ref 6–20)
CALCIUM ION: 1.17 mmol/L (ref 1.15–1.40)
CREATININE: 1.6 mg/dL — AB (ref 0.61–1.24)
Chloride: 95 mmol/L — ABNORMAL LOW (ref 101–111)
GLUCOSE: 110 mg/dL — AB (ref 65–99)
HCT: 45 % (ref 39.0–52.0)
Hemoglobin: 15.3 g/dL (ref 13.0–17.0)
Potassium: 3.8 mmol/L (ref 3.5–5.1)
Sodium: 140 mmol/L (ref 135–145)
TCO2: 35 mmol/L (ref 0–100)

## 2016-09-12 LAB — COMPREHENSIVE METABOLIC PANEL
ALBUMIN: 3.9 g/dL (ref 3.5–5.0)
ALT: 15 U/L — AB (ref 17–63)
AST: 18 U/L (ref 15–41)
Alkaline Phosphatase: 79 U/L (ref 38–126)
Anion gap: 9 (ref 5–15)
BILIRUBIN TOTAL: 0.6 mg/dL (ref 0.3–1.2)
BUN: 9 mg/dL (ref 6–20)
CHLORIDE: 98 mmol/L — AB (ref 101–111)
CO2: 34 mmol/L — ABNORMAL HIGH (ref 22–32)
CREATININE: 1.64 mg/dL — AB (ref 0.61–1.24)
Calcium: 9.4 mg/dL (ref 8.9–10.3)
GFR calc Af Amer: 51 mL/min — ABNORMAL LOW (ref >60)
GFR, EST NON AFRICAN AMERICAN: 44 mL/min — AB (ref >60)
GLUCOSE: 111 mg/dL — AB (ref 65–99)
POTASSIUM: 3.8 mmol/L (ref 3.5–5.1)
Sodium: 141 mmol/L (ref 135–145)
Total Protein: 7.4 g/dL (ref 6.5–8.1)

## 2016-09-12 LAB — LIPASE, BLOOD: Lipase: 20 U/L (ref 11–51)

## 2016-09-12 MED ORDER — ONDANSETRON 4 MG PO TBDP: ORAL_TABLET | ORAL | 0 refills | Status: DC

## 2016-09-12 MED ORDER — SODIUM CHLORIDE 0.9 % IV BOLUS (SEPSIS)
1000.0000 mL | Freq: Once | INTRAVENOUS | Status: AC
  Administered 2016-09-12: 1000 mL via INTRAVENOUS

## 2016-09-12 MED ORDER — MORPHINE SULFATE (PF) 2 MG/ML IV SOLN
4.0000 mg | Freq: Once | INTRAVENOUS | Status: AC
  Administered 2016-09-12: 4 mg via INTRAVENOUS
  Filled 2016-09-12: qty 2

## 2016-09-12 MED ORDER — GI COCKTAIL ~~LOC~~
30.0000 mL | Freq: Once | ORAL | Status: AC
  Administered 2016-09-12: 30 mL via ORAL
  Filled 2016-09-12: qty 30

## 2016-09-12 MED ORDER — ONDANSETRON HCL 4 MG/2ML IJ SOLN
4.0000 mg | Freq: Once | INTRAMUSCULAR | Status: AC
  Administered 2016-09-12: 4 mg via INTRAVENOUS
  Filled 2016-09-12: qty 2

## 2016-09-12 MED ORDER — PANTOPRAZOLE SODIUM 40 MG IV SOLR
40.0000 mg | Freq: Once | INTRAVENOUS | Status: AC
  Administered 2016-09-12: 40 mg via INTRAVENOUS
  Filled 2016-09-12: qty 40

## 2016-09-12 NOTE — ED Notes (Signed)
Pt given PO fluids.

## 2016-09-12 NOTE — ED Notes (Signed)
Patient transported to CT 

## 2016-09-12 NOTE — ED Notes (Signed)
When reassessing pain, pt kept repeating "I'm sick" multiple times. Pt was recently given pain medication and pt states his pain is unchanged.

## 2016-09-12 NOTE — ED Provider Notes (Signed)
[facility] DEPT Provider Note   CSN: [id number] Arrival date & time: 09/12/16  5188     History   Chief Complaint Chief Complaint  Patient presents with  . Abdominal Pain  . Emesis    HPI [name] is a 61 y.o. male.  61 yo M with a chief complaint of epigastric abdominal pain. Going on for at least the past 3 weeks. He has been seen 3 times in the ED for the same. Had a initial CT scan with no concerning findings. He feels that his symptoms have persisted. He thinks is related to his ventral hernia. Is awaiting to see a Psychologist, sport and exercise. Had a history where he had required an emergent hernia repair about 3 years ago. He thinks his symptoms are similar. Prior cholecystectomy and appendectomy. Denies fevers or diarrhea. Denies suspicious food intake. Worse with eating improves with his home nausea and pain medications.   The history is provided by the patient and the spouse.  Abdominal Pain   This is a new problem. The current episode started more than 1 week ago. The problem occurs constantly. The problem has not changed since onset.The pain is associated with eating. The pain is located in the epigastric region. The quality of the pain is aching, sharp and shooting. The pain is at a severity of 9/10. The pain is severe. Associated symptoms include nausea and vomiting. Pertinent negatives include fever, diarrhea, headaches, arthralgias and myalgias. Nothing aggravates the symptoms. Nothing relieves the symptoms.  Emesis   Associated symptoms include abdominal pain. Pertinent negatives include no arthralgias, no chills, no diarrhea, no fever, no headaches and no myalgias.    Past Medical History:  Diagnosis Date  . ALLERGIC RHINITIS 01/25/2007  . ANXIETY DISORDER, GENERALIZED 01/25/2007  . Arthritis   . DEPRESSION 01/25/2007  . ESOPHAGITIS 12/28/2007  . Fatty liver 10/09/2010  . GERD 01/25/2007  . GLUCOSE INTOLERANCE, HX OF 01/25/2007  . Heart murmur    hx of   . HEMORRHOIDS,  INTERNAL, WITH BLEEDING 04/26/2008  . Hiatal hernia   . HYPERLIPIDEMIA 12/27/2007  . HYPERTENSION, ESSENTIAL NOS 01/25/2007  . Hyperthyroidism 08/11/2010  . HYPERTROPHY PROSTATE W/UR OBST & OTH LUTS 02/27/2010  . 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 LOW BACK PAIN, CHRONIC 11/08/2007  . Pancreatitis   . Rectal abscess   . 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 SCOLIOSIS NEC 01/25/2007  . Sebaceous cyst 08/10/2010  . SMOKER 08/05/2009  . UPPER GASTROINTESTINAL HEMORRHAGE 01/25/2007    Patient Active Problem List   Diagnosis Date Noted  . Pain in joint, shoulder region 04/23/2015  . Periumbilical abdominal pain 02/13/2015  . Elevated PSA 08/04/2014  . Subacromial bursitis 04/11/2014  . Neck pain on right side 03/05/2014  . Bilateral shoulder pain 03/05/2014  . Recurrent boils 03/05/2014  . Foreign body in stomach 12/31/2013  . Reflux esophagitis 12/31/2013  . Weight loss 05/06/2012  . Incisional hernia 11/23/2011  . Cramp of limb 01/13/2011  . Hyperthyroidism 08/11/2010  . Sebaceous cyst 08/10/2010  . HYPERTROPHY PROSTATE W/UR OBST & OTH LUTS 02/27/2010  . SMOKER 08/05/2009  . 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 HEMORRHOIDS, INTERNAL, WITH BLEEDING 04/26/2008  . HYPERLIPIDEMIA 12/27/2007  . LOW BACK PAIN, CHRONIC 11/08/2007  . 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 ANXIETY DISORDER, GENERALIZED 01/25/2007  . DEPRESSION 01/25/2007  . Essential hypertension 01/25/2007  . ALLERGIC RHINITIS 01/25/2007  . SCOLIOSIS NEC 01/25/2007  . Impaired glucose tolerance 01/25/2007    Past Surgical History:  Procedure  Laterality Date  . ABDOMINAL EXPLORATION SURGERY  1988  . CHOLECYSTECTOMY  05/20/08   Dr. Zella Richer  . COLONOSCOPY  04/23/2010   normal  . ESOPHAGOGASTRODUODENOSCOPY N/A 12/31/2013   Procedure: ESOPHAGOGASTRODUODENOSCOPY (EGD);  Surgeon: Gatha Mayer, MD;  Location: [facility];  Service: Endoscopy;  Laterality: N/A;  . Excision of scalp lesion  07/2009  . Excision  of thyroid mass    . GANGLION CYST EXCISION     right foot x 2  . HERNIA REPAIR     ventral  . INCISIONAL HERNIA REPAIR  12/01/2011   Procedure: LAPAROSCOPIC INCISIONAL HERNIA;  Surgeon: Harl Bowie, MD;  Location: [facility];  Service: General;  Laterality: N/A;  . SHOULDER SURGERY     right       Home Medications    Prior to Admission medications   Medication Sig Start Date End Date Taking? Authorizing Provider  acetaminophen (TYLENOL) 500 MG tablet Take 1,000 mg by mouth 2 (two) times daily.   Yes [provider]  amLODipine (NORVASC) 10 MG tablet Take 1 tablet (10 mg total) by mouth daily. 07/02/15  Yes Plotnikov, Evie Lacks, MD  esomeprazole (NEXIUM) 20 MG capsule Take 40 mg by mouth daily at 12 noon.   Yes [provider]  fluPHENAZine (PROLIXIN) 10 MG tablet Take 40 mg by mouth daily. 09/06/16  Yes [provider]  losartan-hydrochlorothiazide (HYZAAR) 50-12.5 MG tablet Take 1 tablet by mouth daily. 01/27/16  Yes [provider]  meloxicam (MOBIC) 15 MG tablet Use qd pc prn arthritis 04/23/15  Yes Plotnikov, Evie Lacks, MD  ondansetron (ZOFRAN) 4 MG tablet Take 4 mg by mouth every 6 (six) hours as needed for nausea/vomiting. 09/06/16  Yes [provider]  traMADol (ULTRAM) 50 MG tablet Take 100 mg by mouth 3 (three) times daily as needed for pain. 09/06/16  Yes [provider]  dicyclomine (BENTYL) 10 MG capsule Take 2 capsules (20 mg total) by mouth 4 (four) times daily as needed for spasms. Patient not taking: Reported on 03/02/2016 09/11/15   [name], Elmyra Ricks, PA-C  ondansetron (ZOFRAN ODT) 4 MG disintegrating tablet 4mg  ODT q4 hours prn nausea/vomit 09/12/16   Deno Etienne, DO    Family History Family History  Problem Relation Age of Onset  . Hypertension Father   . Hypertension Mother   . Prostate cancer Other   . Colon cancer Neg Hx     Social History Social History  Substance Use Topics  . Smoking status: Current  Every Day Smoker    Packs/day: 1.50    Years: 30.00    Types: Cigarettes  . Smokeless tobacco: Never Used  . Alcohol use No     Allergies   Celecoxib; Iohexol; Metoclopramide hcl; and Wellbutrin [bupropion]   Review of Systems Review of Systems  Constitutional: Negative for chills and fever.  HENT: Negative for congestion and facial swelling.   Eyes: Negative for discharge and visual disturbance.  Respiratory: Negative for shortness of breath.   Cardiovascular: Negative for chest pain and palpitations.  Gastrointestinal: Positive for abdominal pain, nausea and vomiting. Negative for diarrhea.  Musculoskeletal: Negative for arthralgias and myalgias.  Skin: Negative for color change and rash.  Neurological: Negative for tremors, syncope and headaches.  Psychiatric/Behavioral: Negative for confusion and dysphoric mood.     Physical Exam Updated Vital Signs BP (!) 184/95   Pulse 87   Temp 97.9 F (36.6 C) (Oral)   Resp 18   SpO2 98%   Physical Exam  Constitutional: He is oriented to person, place, and time. He appears well-developed and well-nourished.  HENT:  Head: Normocephalic and atraumatic.  Eyes: EOM are normal. Pupils are equal, round, and reactive to light.  Neck: Normal range of motion. Neck supple. No JVD present.  Cardiovascular: Normal rate and regular rhythm.  Exam reveals no gallop and no friction rub.   No murmur heard. Pulmonary/Chest: No respiratory distress. He has no wheezes.  Abdominal: He exhibits no distension and no mass. There is tenderness. There is no rebound and no guarding.  Large ventral incision. Pain to the right upper quadrant. Ventral hernia that is easily reducible.  Musculoskeletal: Normal range of motion.  Neurological: He is alert and oriented to person, place, and time.  Mildly sleepy on exam  Skin: No rash noted. No pallor.  Psychiatric: He has a normal mood and affect. His behavior is normal.  Nursing note and vitals  reviewed.    ED Treatments / Results  Labs (all labs ordered are listed, but only abnormal results are displayed) Labs Reviewed  CBC WITH DIFFERENTIAL/PLATELET - Abnormal; Notable for the following:       Result Value   WBC 11.7 (*)    Neutro Abs 9.1 (*)    All other components within normal limits  COMPREHENSIVE METABOLIC PANEL - Abnormal; Notable for the following:    Chloride 98 (*)    CO2 34 (*)    Glucose, Bld 111 (*)    Creatinine, Ser 1.64 (*)    ALT 15 (*)    GFR calc non Af Amer 44 (*)    GFR calc Af Amer 51 (*)    All other components within normal limits  I-STAT CHEM 8, ED - Abnormal; Notable for the following:    Chloride 95 (*)    Creatinine, Ser 1.60 (*)    Glucose, Bld 110 (*)    All other components within normal limits  LIPASE, BLOOD    EKG  EKG Interpretation None       Radiology Ct Abdomen Pelvis Wo Contrast  Result Date: 09/12/2016 CLINICAL DATA:  Acute upper abdominal pain. EXAM: CT ABDOMEN AND PELVIS WITHOUT CONTRAST TECHNIQUE: Multidetector CT imaging of the abdomen and pelvis was performed following the standard protocol without IV contrast. COMPARISON:  CT scan of Sep 04, 2016. FINDINGS: Lower chest: No acute abnormality. Hepatobiliary: No focal liver abnormality is seen. Status post cholecystectomy. No biliary dilatation. Pancreas: Unremarkable. No pancreatic ductal dilatation or surrounding inflammatory changes. Spleen: Normal in size without focal abnormality. Adrenals/Urinary Tract: Adrenal glands are unremarkable. Kidneys are normal, without renal calculi, focal lesion, or hydronephrosis. Bladder is unremarkable. Stomach/Bowel: There is no evidence of bowel obstruction. No inflammatory changes are noted. The appendix is not visualized. Vascular/Lymphatic: Aortic atherosclerosis. No enlarged abdominal or pelvic lymph nodes. Reproductive: Stable mild prostatic enlargement is noted. Other: Stable moderate size fat containing ventral hernia is seen in  the epigastric region. No abnormal fluid collection is noted. Stable subcutaneous nodule is seen in anterior abdominal wall in the right lower quadrant. Musculoskeletal: No acute or significant osseous findings. IMPRESSION: Aortic atherosclerosis. Stable mild prostatic enlargement. Stable moderate size fat containing ventral hernia is noted in epigastric region. No other abnormality seen in the abdomen or pelvis. Electronically Signed   By: Marijo Conception, M.D.   On: 09/12/2016 13:27    Procedures Procedures (including critical care time)  Medications Ordered in ED Medications  sodium chloride 0.9 % bolus 1,000 mL (0 mLs Intravenous Stopped 09/12/16  1507)  ondansetron (ZOFRAN) injection 4 mg (4 mg Intravenous Given 09/12/16 1234)  morphine 2 MG/ML injection 4 mg (4 mg Intravenous Given 09/12/16 1234)  gi cocktail (Maalox,Lidocaine,Donnatal) (30 mLs Oral Given 09/12/16 1237)  pantoprazole (PROTONIX) injection 40 mg (40 mg Intravenous Given 09/12/16 1350)     Initial Impression / Assessment and Plan / ED Course  I have reviewed the triage vital signs and the nursing notes.  Pertinent labs & imaging results that were available during my care of the patient were reviewed by me and considered in my medical decision making (see chart for details).     61 yo M With a chief complaint of epigastric abdominal pain. Patient's pain is worse in the right upper quadrant on my exam. As his symptoms have persisted for 3 weeks will obtain a repeat CT scan. Obtain labs give pain meds and fluids.  Symptoms improved with IV fluids and pain medicine. CT scan without concerning findings. I discussed results with family. Patient was given Protonix as he does continue to have some pain. On his prior visits this was thought to be likely related to his hernia. On my exam there is no tenderness to that area. I think this may be a stomach ulcer versus gastritis. Will refer to GI. Able to tolerate by mouth in the ED.  Discharge home. 4:52 PM:  I have discussed the diagnosis/risks/treatment options with the patient and family and believe the pt to be eligible for discharge home to follow-up with GI, PCP. We also discussed returning to the ED immediately if new or worsening sx occur. We discussed the sx which are most concerning (e.g., sudden worsening pain, fever, inability to tolerate by mouth) that necessitate immediate return. Medications administered to the patient during their visit and any new prescriptions provided to the patient are listed below.  Medications given during this visit Medications  sodium chloride 0.9 % bolus 1,000 mL (0 mLs Intravenous Stopped 09/12/16 1507)  ondansetron (ZOFRAN) injection 4 mg (4 mg Intravenous Given 09/12/16 1234)  morphine 2 MG/ML injection 4 mg (4 mg Intravenous Given 09/12/16 1234)  gi cocktail (Maalox,Lidocaine,Donnatal) (30 mLs Oral Given 09/12/16 1237)  pantoprazole (PROTONIX) injection 40 mg (40 mg Intravenous Given 09/12/16 1350)     The patient appears reasonably screen and/or stabilized for discharge and I doubt any other medical condition or other [facility] requiring further screening, evaluation, or treatment in the ED at this time prior to discharge.    Final Clinical Impressions(s) / ED Diagnoses   Final diagnoses:  Non-intractable vomiting with nausea, unspecified vomiting type  RUQ abdominal pain  Ventral hernia without obstruction or gangrene    New Prescriptions Discharge Medication List as of 09/12/2016  2:29 PM    START taking these medications   Details  ondansetron (ZOFRAN ODT) 4 MG disintegrating tablet 4mg  ODT q4 hours prn nausea/vomit, Print         Deno Etienne, DO 09/12/16 1652

## 2016-09-12 NOTE — ED Triage Notes (Signed)
Pt c/o upper abdominal pain and epigastric pain. Pt states pain started over a week ago. Pt states pain has remained the same. Pt also c/o vomiting.

## 2016-09-21 DIAGNOSIS — Z720 Tobacco use: Secondary | ICD-10-CM | POA: Diagnosis not present

## 2016-09-21 DIAGNOSIS — K432 Incisional hernia without obstruction or gangrene: Secondary | ICD-10-CM | POA: Diagnosis not present

## 2016-09-23 ENCOUNTER — Ambulatory Visit: Payer: BLUE CROSS/BLUE SHIELD | Admitting: Nurse Practitioner

## 2016-09-23 ENCOUNTER — Ambulatory Visit (INDEPENDENT_AMBULATORY_CARE_PROVIDER_SITE_OTHER): Payer: BLUE CROSS/BLUE SHIELD | Admitting: Nurse Practitioner

## 2016-09-23 ENCOUNTER — Encounter: Payer: Self-pay | Admitting: Nurse Practitioner

## 2016-09-23 VITALS — BP 120/80 | HR 112 | Ht 68.75 in | Wt 233.5 lb

## 2016-09-23 DIAGNOSIS — R112 Nausea with vomiting, unspecified: Secondary | ICD-10-CM | POA: Diagnosis not present

## 2016-09-23 DIAGNOSIS — R1013 Epigastric pain: Secondary | ICD-10-CM

## 2016-09-23 NOTE — Patient Instructions (Signed)
If you are age 60 or older, your body mass index should be between 23-30. Your Body mass index is 34.73 kg/m. If this is out of the aforementioned range listed, please consider follow up with your Primary Care Provider.  If you are age 1 or younger, your body mass index should be between 19-25. Your Body mass index is 34.73 kg/m. If this is out of the aformentioned range listed, please consider follow up with your Primary Care Provider.   Continue Nexium.  Discontinue Mobic (Meloxicam)  DO NOT TAKE: Ibuprofen, Motrin, Aleve or Goody Powder.  Call if symptoms recur.  Thank you for choosing me and [facility].   Tye Savoy, NP

## 2016-09-23 NOTE — Progress Notes (Signed)
     HPI: Patient is a 61 yo male previously followed by Dr. Olevia Perches. Patient has a history of  a ventral hernia with prior repair and revision. He has been to ED three times over the last few weeks for evaluation of non-radiating epigastric pain, nausea, vomiting. Pain intermittent, unrelated to eating. On 5/19 he was seen at ED in [facility]. WBC was 12.5.  Lipase and LFTs were normal. A non-contrast CT scan was unrevealing. unremarkable . Since then he has been to ED twice for persistent symptoms. Patient concerned that pain is related to his ventral hernia. Repeat non-contrast CTscan in [place] on 5/27 was also unrevealing. Patient took meloxicam for a long time, ran out of prescription several days ago. Now taking Ibuprofen as needed. Yesterday patient took some pepto bismol and today feels the best he has felt in 20 years.  He hasn't had any black stools, no hematemesis. Weight down about 5 pounds since symptoms started a few weeks ago .   Past Medical History:  Diagnosis Date  . ALLERGIC RHINITIS 01/25/2007  . ANXIETY DISORDER, GENERALIZED 01/25/2007  . Arthritis   . DEPRESSION 01/25/2007  . ESOPHAGITIS 12/28/2007  . Fatty liver 10/09/2010  . GERD 01/25/2007  . GLUCOSE INTOLERANCE, HX OF 01/25/2007  . Heart murmur    hx of   . HEMORRHOIDS, INTERNAL, WITH BLEEDING 04/26/2008  . Hiatal hernia   . HYPERLIPIDEMIA 12/27/2007  . HYPERTENSION, ESSENTIAL NOS 01/25/2007  . Hyperthyroidism 08/11/2010  . HYPERTROPHY PROSTATE W/UR OBST & OTH LUTS 02/27/2010  . 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 LOW BACK PAIN, CHRONIC 11/08/2007  . Pancreatitis   . Rectal abscess   . 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 SCOLIOSIS NEC 01/25/2007  . Sebaceous cyst 08/10/2010  . SMOKER 08/05/2009  . UPPER GASTROINTESTINAL HEMORRHAGE 01/25/2007    Patient's surgical history, family medical history, social history, medications and allergies were all reviewed in Epic    Physical Exam: BP 120/80 (BP Location: Left  Arm, Patient Position: Sitting, Cuff Size: Normal)   Pulse (!) 112   Ht 5' 8.75" (1.746 m) Comment: height measured without shoes  Wt 233 lb 8 oz (105.9 kg)   BMI 34.73 kg/m   GENERAL: well developed black male in NAD PSYCH: :Pleasant, cooperative, normal affect EENT:  conjunctiva pink, mucous membranes moist, neck supple without masses CARDIAC:  RRR, no peripheral edema PULM: Normal respiratory effort, lungs CTA bilaterally, no wheezing ABDOMEN:  soft, nontender, nondistended, no obvious masses,  normal bowel sounds SKIN:  turgor, no lesions seen Musculoskeletal:  Normal muscle tone, normal strength NEURO: Alert and oriented x 3, no focal neurologic deficits    ASSESSMENT and PLAN:  1. Pleasant 61 year old male with recent exacerbation of chronic nausea and vomiting. Two non-contrast CTscan unremarkable. Labs unrevealing. Feels better after taking pepto bismol. It should be noted that patient ran out of meloxicam recently. Wonder if he is better because not taking the meloxicam. He is taking Ibuprofen in place of the meloxicam. PUD?  -Advised to continue PPI -hold mobic and ibuprofen over the next few weeks. If symptoms don't improve off NSAIDS then patient will need further workup.    2.  GERD / hiatal hernia (4cm). Stable on PPI   Tye Savoy , NP 09/23/2016, 2:43 PM

## 2016-09-27 DIAGNOSIS — I10 Essential (primary) hypertension: Secondary | ICD-10-CM | POA: Diagnosis not present

## 2016-09-27 DIAGNOSIS — E139 Other specified diabetes mellitus without complications: Secondary | ICD-10-CM | POA: Diagnosis not present

## 2016-11-01 NOTE — Progress Notes (Signed)
Thank you for sending this case to me. I have reviewed the entire note, and the outlined plan seems appropriate.  I see that he was H pylori neg on a gastric Bx in 2008. Suspect NSAIDs causing symptoms. Please be sure we follow up with him by phone or have him come to clinic because if not feeling better in next few weeks, he will need EGD.  Wilfrid Lund, MD

## 2016-11-10 ENCOUNTER — Telehealth: Payer: Self-pay

## 2016-11-10 NOTE — Telephone Encounter (Signed)
Left message to call back  

## 2016-11-10 NOTE — Telephone Encounter (Signed)
-----   Message from Jeoffrey Massed, RN sent at 11/04/2016  9:59 AM EDT ----- Left message on machine to call back regarding pt condition.  ----- Message ----- From: Willia Craze, NP Sent: 11/03/2016   5:09 PM To: Greggory Keen, LPN  Beth, can you give patient a call and see if his nausea / vomiting got better. Thanks

## 2016-12-27 DIAGNOSIS — F200 Paranoid schizophrenia: Secondary | ICD-10-CM | POA: Diagnosis not present

## 2016-12-27 DIAGNOSIS — K469 Unspecified abdominal hernia without obstruction or gangrene: Secondary | ICD-10-CM | POA: Diagnosis not present

## 2016-12-27 DIAGNOSIS — I10 Essential (primary) hypertension: Secondary | ICD-10-CM | POA: Diagnosis not present

## 2016-12-27 DIAGNOSIS — M25511 Pain in right shoulder: Secondary | ICD-10-CM | POA: Diagnosis not present

## 2016-12-27 DIAGNOSIS — F17210 Nicotine dependence, cigarettes, uncomplicated: Secondary | ICD-10-CM | POA: Diagnosis not present

## 2016-12-28 DIAGNOSIS — I10 Essential (primary) hypertension: Secondary | ICD-10-CM | POA: Diagnosis not present

## 2016-12-28 DIAGNOSIS — E139 Other specified diabetes mellitus without complications: Secondary | ICD-10-CM | POA: Diagnosis not present

## 2017-02-01 DIAGNOSIS — F200 Paranoid schizophrenia: Secondary | ICD-10-CM | POA: Diagnosis not present

## 2017-02-01 DIAGNOSIS — K450 Other specified abdominal hernia with obstruction, without gangrene: Secondary | ICD-10-CM | POA: Diagnosis not present

## 2017-02-01 DIAGNOSIS — E139 Other specified diabetes mellitus without complications: Secondary | ICD-10-CM | POA: Diagnosis not present

## 2017-02-02 DIAGNOSIS — E139 Other specified diabetes mellitus without complications: Secondary | ICD-10-CM | POA: Diagnosis not present

## 2017-02-02 DIAGNOSIS — F200 Paranoid schizophrenia: Secondary | ICD-10-CM | POA: Diagnosis not present

## 2017-02-02 DIAGNOSIS — I10 Essential (primary) hypertension: Secondary | ICD-10-CM | POA: Diagnosis not present

## 2017-02-08 DIAGNOSIS — N5201 Erectile dysfunction due to arterial insufficiency: Secondary | ICD-10-CM | POA: Diagnosis not present

## 2017-02-08 DIAGNOSIS — C61 Malignant neoplasm of prostate: Secondary | ICD-10-CM | POA: Diagnosis not present

## 2017-02-08 DIAGNOSIS — N138 Other obstructive and reflux uropathy: Secondary | ICD-10-CM | POA: Diagnosis not present

## 2017-02-15 DIAGNOSIS — M25511 Pain in right shoulder: Secondary | ICD-10-CM | POA: Diagnosis not present

## 2017-02-15 DIAGNOSIS — M542 Cervicalgia: Secondary | ICD-10-CM | POA: Diagnosis not present

## 2017-03-08 ENCOUNTER — Encounter: Payer: Self-pay | Admitting: Specialist

## 2017-03-24 ENCOUNTER — Other Ambulatory Visit: Payer: Self-pay

## 2017-03-24 ENCOUNTER — Encounter (HOSPITAL_COMMUNITY): Payer: Self-pay | Admitting: *Deleted

## 2017-03-24 ENCOUNTER — Emergency Department (HOSPITAL_COMMUNITY)
Admission: EM | Admit: 2017-03-24 | Discharge: 2017-03-24 | Disposition: A | Discharge status: Home / Self Care | Payer: BLUE CROSS/BLUE SHIELD | Attending: Emergency Medicine | Admitting: Emergency Medicine

## 2017-03-24 ENCOUNTER — Emergency Department (HOSPITAL_COMMUNITY): Payer: BLUE CROSS/BLUE SHIELD

## 2017-03-24 DIAGNOSIS — M25512 Pain in left shoulder: Secondary | ICD-10-CM | POA: Insufficient documentation

## 2017-03-24 DIAGNOSIS — R0789 Other chest pain: Secondary | ICD-10-CM | POA: Insufficient documentation

## 2017-03-24 DIAGNOSIS — F17210 Nicotine dependence, cigarettes, uncomplicated: Secondary | ICD-10-CM | POA: Insufficient documentation

## 2017-03-24 DIAGNOSIS — I10 Essential (primary) hypertension: Secondary | ICD-10-CM | POA: Insufficient documentation

## 2017-03-24 DIAGNOSIS — R0602 Shortness of breath: Secondary | ICD-10-CM | POA: Diagnosis not present

## 2017-03-24 DIAGNOSIS — Z79899 Other long term (current) drug therapy: Secondary | ICD-10-CM | POA: Insufficient documentation

## 2017-03-24 DIAGNOSIS — R079 Chest pain, unspecified: Secondary | ICD-10-CM | POA: Diagnosis not present

## 2017-03-24 LAB — CBC
HEMATOCRIT: 44.2 % (ref 39.0–52.0)
HEMOGLOBIN: 15.4 g/dL (ref 13.0–17.0)
MCH: 30.5 pg (ref 26.0–34.0)
MCHC: 34.8 g/dL (ref 30.0–36.0)
MCV: 87.5 fL (ref 78.0–100.0)
Platelets: 314 10*3/uL (ref 150–400)
RBC: 5.05 MIL/uL (ref 4.22–5.81)
RDW: 13.8 % (ref 11.5–15.5)
WBC: 9.4 10*3/uL (ref 4.0–10.5)

## 2017-03-24 LAB — I-STAT TROPONIN, ED
TROPONIN I, POC: 0 ng/mL (ref 0.00–0.08)
Troponin i, poc: 0 ng/mL (ref 0.00–0.08)

## 2017-03-24 LAB — BASIC METABOLIC PANEL
ANION GAP: 10 (ref 5–15)
CALCIUM: 9.4 mg/dL (ref 8.9–10.3)
CO2: 28 mmol/L (ref 22–32)
Chloride: 100 mmol/L — ABNORMAL LOW (ref 101–111)
Creatinine, Ser: 1.38 mg/dL — ABNORMAL HIGH (ref 0.61–1.24)
GFR calc Af Amer: >60 mL/min (ref >60)
GFR, EST NON AFRICAN AMERICAN: 54 mL/min — AB (ref >60)
GLUCOSE: 122 mg/dL — AB (ref 65–99)
Potassium: 3.3 mmol/L — ABNORMAL LOW (ref 3.5–5.1)
SODIUM: 138 mmol/L (ref 135–145)

## 2017-03-24 MED ORDER — KETOROLAC TROMETHAMINE 30 MG/ML IJ SOLN
15.0000 mg | Freq: Once | INTRAMUSCULAR | Status: AC
  Administered 2017-03-24: 15 mg via INTRAMUSCULAR
  Filled 2017-03-24: qty 1

## 2017-03-24 MED ORDER — ACETAMINOPHEN 500 MG PO TABS
1000.0000 mg | ORAL_TABLET | Freq: Once | ORAL | Status: AC
  Administered 2017-03-24: 1000 mg via ORAL
  Filled 2017-03-24: qty 2

## 2017-03-24 NOTE — Discharge Instructions (Signed)
SEE YOUR ORTHOPEDIC DOCTOR ABOUT YOUR LEFT SHOULDER. RETURN TO ER IF YOU HAVE ANY BREATHING PROBLEMS OR BAD CHEST PAIN.

## 2017-03-24 NOTE — ED Provider Notes (Signed)
[facility] Provider Note   CSN: [id number] Arrival date & time: 03/24/17  7793     History   Chief Complaint Chief Complaint  Patient presents with  . Chest Pain    HPI [name] is a 61 y.o. male.  61 year old male with extensive past medical history including schizophrenia, hypertension, GERD who presents with left chest and left shoulder pain.  The patient states that he has had 2 days of intermittent left chest and left shoulder pain that occurs randomly and is worse with movements of his left arm.  He denies any trauma or recent physical activity.  He denies any associated shortness of breath, nausea, or vomiting.  He notes he has problems with pain in his right shoulder and recently had a steroid injection on the right.  He has not had any fevers or cold symptoms.  He took one Advil a few times yesterday.  No medications yet today.   The history is provided by the patient.  Chest Pain      Past Medical History:  Diagnosis Date  . ALLERGIC RHINITIS 01/25/2007  . ANXIETY DISORDER, GENERALIZED 01/25/2007  . Arthritis   . DEPRESSION 01/25/2007  . ESOPHAGITIS 12/28/2007  . Fatty liver 10/09/2010  . GERD 01/25/2007  . GLUCOSE INTOLERANCE, HX OF 01/25/2007  . Heart murmur    hx of   . HEMORRHOIDS, INTERNAL, WITH BLEEDING 04/26/2008  . Hiatal hernia   . HYPERLIPIDEMIA 12/27/2007  . HYPERTENSION, ESSENTIAL NOS 01/25/2007  . Hyperthyroidism 08/11/2010  . HYPERTROPHY PROSTATE W/UR OBST & OTH LUTS 02/27/2010  . 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 LOW BACK PAIN, CHRONIC 11/08/2007  . Pancreatitis   . Rectal abscess   . 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 SCOLIOSIS NEC 01/25/2007  . Sebaceous cyst 08/10/2010  . SMOKER 08/05/2009  . UPPER GASTROINTESTINAL HEMORRHAGE 01/25/2007    Patient Active Problem List   Diagnosis Date Noted  . Pain in joint, shoulder region 04/23/2015  . Periumbilical abdominal pain 02/13/2015  . Elevated  PSA 08/04/2014  . Subacromial bursitis 04/11/2014  . Neck pain on right side 03/05/2014  . Bilateral shoulder pain 03/05/2014  . Recurrent boils 03/05/2014  . Foreign body in stomach 12/31/2013  . Reflux esophagitis 12/31/2013  . Weight loss 05/06/2012  . Incisional hernia 11/23/2011  . Cramp of limb 01/13/2011  . Hyperthyroidism 08/11/2010  . Sebaceous cyst 08/10/2010  . HYPERTROPHY PROSTATE W/UR OBST & OTH LUTS 02/27/2010  . SMOKER 08/05/2009  . 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 HEMORRHOIDS, INTERNAL, WITH BLEEDING 04/26/2008  . HYPERLIPIDEMIA 12/27/2007  . LOW BACK PAIN, CHRONIC 11/08/2007  . 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 ANXIETY DISORDER, GENERALIZED 01/25/2007  . DEPRESSION 01/25/2007  . Essential hypertension 01/25/2007  . ALLERGIC RHINITIS 01/25/2007  . SCOLIOSIS NEC 01/25/2007  . Impaired glucose tolerance 01/25/2007    Past Surgical History:  Procedure Laterality Date  . ABDOMINAL EXPLORATION SURGERY  1988  . CHOLECYSTECTOMY  05/20/08   Dr. Zella Richer  . COLONOSCOPY  04/23/2010   normal  . ESOPHAGOGASTRODUODENOSCOPY N/A 12/31/2013   Procedure: ESOPHAGOGASTRODUODENOSCOPY (EGD);  Surgeon: Gatha Mayer, MD;  Location: [facility];  Service: Endoscopy;  Laterality: N/A;  . Excision of scalp lesion  07/2009  . Excision of thyroid mass    . GANGLION CYST EXCISION     right foot x 2  . HERNIA REPAIR     ventral  . INCISIONAL HERNIA REPAIR  12/01/2011   Procedure: LAPAROSCOPIC  INCISIONAL HERNIA;  Surgeon: Harl Bowie, MD;  Location: [facility];  Service: General;  Laterality: N/A;  . SHOULDER SURGERY     right       Home Medications    Prior to Admission medications   Medication Sig Start Date End Date Taking? Authorizing Provider  acetaminophen (TYLENOL) 500 MG tablet Take 1,000 mg by mouth 2 (two) times daily.    [provider]  amLODipine (NORVASC) 10 MG tablet Take 1 tablet (10 mg total) by mouth daily. 07/02/15   Plotnikov,  Evie Lacks, MD  dicyclomine (BENTYL) 10 MG capsule Take 2 capsules (20 mg total) by mouth 4 (four) times daily as needed for spasms. 09/11/15   [name], Elmyra Ricks, PA-C  esomeprazole (NEXIUM) 20 MG capsule Take 40 mg by mouth daily at 12 noon.    [provider]  fluPHENAZine (PROLIXIN) 10 MG tablet Take 40 mg by mouth daily. 09/06/16   [provider]  losartan-hydrochlorothiazide (HYZAAR) 50-12.5 MG tablet Take 1 tablet by mouth daily. 01/27/16   [provider]  meloxicam (MOBIC) 15 MG tablet Use qd pc prn arthritis 04/23/15   Plotnikov, Evie Lacks, MD  traMADol (ULTRAM) 50 MG tablet Take 100 mg by mouth 3 (three) times daily as needed for pain. 09/06/16   [provider]    Family History Family History  Problem Relation Age of Onset  . Hypertension Father   . Hypertension Mother   . Prostate cancer Other   . Colon cancer Neg Hx     Social History Social History   Tobacco Use  . Smoking status: Current Every Day Smoker    Packs/day: 1.50    Years: 30.00    Pack years: 45.00    Types: Cigarettes  . Smokeless tobacco: Never Used  Substance Use Topics  . Alcohol use: No  . Drug use: Yes    Frequency: 14.0 times per week    Types: Marijuana     Allergies   Celecoxib; Iohexol; Metoclopramide hcl; and Wellbutrin [bupropion]   Review of Systems Review of Systems  Cardiovascular: Positive for chest pain.    All other systems reviewed and are negative except that which was mentioned in HPI   Physical Exam Updated Vital Signs BP (!) 131/98 (BP Location: Left Arm)   Pulse 99   Temp 98.3 F (36.8 C) (Oral)   Resp 20   SpO2 100%   Physical Exam  Constitutional: He is oriented to person, place, and time. He appears well-developed and well-nourished. No distress.  HENT:  Head: Normocephalic and atraumatic.  Moist mucous membranes  Eyes: Conjunctivae are normal. Pupils are equal, round, and reactive to light.  Neck: Neck supple.    Cardiovascular: Normal rate, regular rhythm and normal heart sounds.  No murmur heard. L upper anterior chest wall tenderness near left shoulder  Pulmonary/Chest: Effort normal and breath sounds normal.  Abdominal: Soft. Bowel sounds are normal. He exhibits no distension. There is no tenderness.  Musculoskeletal: Normal range of motion. He exhibits no edema.  Normal ROM L shoulder, mild anterior shoulder tenderness and along pectoralis muscle  Neurological: He is alert and oriented to person, place, and time.  Fluent speech, normal gait  Skin: Skin is warm and dry.  No erythema or rash on chest  Psychiatric: Judgment normal.  Bizarre affect  Nursing note and vitals reviewed.    ED Treatments / Results  Labs (all labs ordered are listed, but only abnormal results are displayed) Labs Reviewed  BASIC METABOLIC PANEL - Abnormal; Notable for the following components:      Result Value   Potassium 3.3 (*)    Chloride 100 (*)    Glucose, Bld 122 (*)    BUN <5 (*)    Creatinine, Ser 1.38 (*)    GFR calc non Af Amer 54 (*)    All other components within normal limits  CBC  I-STAT TROPONIN, ED  I-STAT TROPONIN, ED    EKG  EKG Interpretation  Date/Time:  Thursday March 24 2017 09:26:09 EST Ventricular Rate:  116 PR Interval:  148 QRS Duration: 88 QT Interval:  330 QTC Calculation: 458 R Axis:   21 Text Interpretation:  Sinus tachycardia Otherwise normal ECG tachycardia new from previous Confirmed by Theotis Burrow [phone]) on 03/24/2017 12:29:32 PM       Radiology Dg Chest 2 View  Result Date: 03/24/2017 CLINICAL DATA:  Left chest pain for 2 days with left arm pain in shortness of breath. EXAM: CHEST  2 VIEW COMPARISON:  05/25/2015 FINDINGS: Stable small clips along the lower neck compatible with prior thyroid surgery. The lungs appear clear. Cardiac and mediastinal contours normal. No pleural effusion identified. IMPRESSION: 1.  No active cardiopulmonary disease is  radiographically apparent. Electronically Signed   By: Van Clines M.D.   On: 03/24/2017 10:28    Procedures Procedures (including critical care time)  Medications Ordered in ED Medications  ketorolac (TORADOL) 30 MG/ML injection 15 mg (15 mg Intramuscular Given 03/24/17 1521)  acetaminophen (TYLENOL) tablet 1,000 mg (1,000 mg Oral Given 03/24/17 1521)     Initial Impression / Assessment and Plan / ED Course  I have reviewed the triage vital signs and the nursing notes.  Pertinent labs & imaging results that were available during my care of the patient were reviewed by me and considered in my medical decision making (see chart for details).     2d non-exertional L shoulder and L chest pain near shoulder, worse with arm movement and reproducible on exam.  He is otherwise well-appearing, has no evidence of infection. CXR and labs reassuring. Cr near baseline. Cardiac process seems highly unlikely as his exam is highly suggestive of musculoskeletal etiology and he has a history of shoulder problems on the right.  Provided with a small dose of Toradol here given his history of kidney disease.  Because of his comorbidities I did check serial troponins which were negative.  He has been ambulatory here and will follow up with his orthopedic doctor to have a reassessment of his shoulder.  Return precautions given and patient discharged in satisfactory condition.  Final Clinical Impressions(s) / ED Diagnoses   Final diagnoses:  Chest wall pain  Acute pain of left shoulder    ED Discharge Orders    None       Aracelli Woloszyn, Wenda Overland, MD 03/24/17 1836

## 2017-03-24 NOTE — ED Notes (Signed)
ED Provider at bedside. 

## 2017-03-24 NOTE — ED Notes (Signed)
Got patient on the monitor patient is  Resting

## 2017-03-24 NOTE — ED Triage Notes (Signed)
To ED for eval of cp and left arm pain for past 2 days. Pt states pain increases with movement of left arm. No sob. States it hurt to even drive here. No n/v.

## 2017-03-24 NOTE — ED Notes (Signed)
Pt ambulated to restroom. 

## 2017-03-25 DIAGNOSIS — M7582 Other shoulder lesions, left shoulder: Secondary | ICD-10-CM | POA: Diagnosis not present

## 2017-04-06 DIAGNOSIS — M542 Cervicalgia: Secondary | ICD-10-CM | POA: Diagnosis not present

## 2017-04-06 DIAGNOSIS — M25512 Pain in left shoulder: Secondary | ICD-10-CM | POA: Diagnosis not present

## 2017-04-06 DIAGNOSIS — M5412 Radiculopathy, cervical region: Secondary | ICD-10-CM | POA: Diagnosis not present

## 2017-04-07 DIAGNOSIS — M5412 Radiculopathy, cervical region: Secondary | ICD-10-CM | POA: Diagnosis not present

## 2017-04-07 DIAGNOSIS — M25512 Pain in left shoulder: Secondary | ICD-10-CM | POA: Diagnosis not present

## 2017-04-07 DIAGNOSIS — M542 Cervicalgia: Secondary | ICD-10-CM | POA: Diagnosis not present

## 2017-04-15 DIAGNOSIS — M542 Cervicalgia: Secondary | ICD-10-CM | POA: Diagnosis not present

## 2017-04-20 DIAGNOSIS — M5412 Radiculopathy, cervical region: Secondary | ICD-10-CM | POA: Diagnosis not present

## 2017-04-26 DIAGNOSIS — M5412 Radiculopathy, cervical region: Secondary | ICD-10-CM | POA: Diagnosis not present

## 2017-04-26 DIAGNOSIS — M542 Cervicalgia: Secondary | ICD-10-CM | POA: Diagnosis not present

## 2017-04-29 DIAGNOSIS — M5412 Radiculopathy, cervical region: Secondary | ICD-10-CM | POA: Diagnosis not present

## 2017-04-29 DIAGNOSIS — M542 Cervicalgia: Secondary | ICD-10-CM | POA: Diagnosis not present

## 2017-05-02 DIAGNOSIS — C61 Malignant neoplasm of prostate: Secondary | ICD-10-CM | POA: Diagnosis not present

## 2017-05-02 DIAGNOSIS — N5201 Erectile dysfunction due to arterial insufficiency: Secondary | ICD-10-CM | POA: Diagnosis not present

## 2017-05-02 DIAGNOSIS — R3912 Poor urinary stream: Secondary | ICD-10-CM | POA: Diagnosis not present

## 2017-08-03 DIAGNOSIS — D234 Other benign neoplasm of skin of scalp and neck: Secondary | ICD-10-CM | POA: Diagnosis not present

## 2017-08-03 DIAGNOSIS — R590 Localized enlarged lymph nodes: Secondary | ICD-10-CM | POA: Diagnosis not present

## 2017-08-09 ENCOUNTER — Other Ambulatory Visit: Payer: Self-pay | Admitting: General Surgery

## 2017-08-09 DIAGNOSIS — L729 Follicular cyst of the skin and subcutaneous tissue, unspecified: Secondary | ICD-10-CM | POA: Diagnosis not present

## 2017-08-09 DIAGNOSIS — L723 Sebaceous cyst: Secondary | ICD-10-CM | POA: Diagnosis not present

## 2017-08-09 DIAGNOSIS — L720 Epidermal cyst: Secondary | ICD-10-CM | POA: Diagnosis not present

## 2017-09-26 DIAGNOSIS — F17210 Nicotine dependence, cigarettes, uncomplicated: Secondary | ICD-10-CM | POA: Diagnosis not present

## 2017-09-26 DIAGNOSIS — F200 Paranoid schizophrenia: Secondary | ICD-10-CM | POA: Diagnosis not present

## 2017-09-26 DIAGNOSIS — I10 Essential (primary) hypertension: Secondary | ICD-10-CM | POA: Diagnosis not present

## 2017-09-26 DIAGNOSIS — E782 Mixed hyperlipidemia: Secondary | ICD-10-CM | POA: Diagnosis not present

## 2017-09-28 ENCOUNTER — Other Ambulatory Visit: Payer: Self-pay | Admitting: Nephrology

## 2017-09-28 DIAGNOSIS — Z720 Tobacco use: Secondary | ICD-10-CM | POA: Diagnosis not present

## 2017-09-28 DIAGNOSIS — I129 Hypertensive chronic kidney disease with stage 1 through stage 4 chronic kidney disease, or unspecified chronic kidney disease: Secondary | ICD-10-CM | POA: Diagnosis not present

## 2017-09-28 DIAGNOSIS — N183 Chronic kidney disease, stage 3 (moderate): Secondary | ICD-10-CM | POA: Diagnosis not present

## 2017-09-29 ENCOUNTER — Other Ambulatory Visit: Payer: Self-pay | Admitting: Nephrology

## 2017-09-29 DIAGNOSIS — I129 Hypertensive chronic kidney disease with stage 1 through stage 4 chronic kidney disease, or unspecified chronic kidney disease: Secondary | ICD-10-CM

## 2017-09-29 DIAGNOSIS — N183 Chronic kidney disease, stage 3 unspecified: Secondary | ICD-10-CM

## 2017-09-29 DIAGNOSIS — Z720 Tobacco use: Secondary | ICD-10-CM

## 2017-09-30 ENCOUNTER — Ambulatory Visit
Admission: RE | Admit: 2017-09-30 | Discharge: 2017-09-30 | Disposition: A | Discharge status: Home / Self Care | Payer: Medicare Other | Source: Ambulatory Visit | Attending: Nephrology | Admitting: Nephrology

## 2017-09-30 DIAGNOSIS — N183 Chronic kidney disease, stage 3 unspecified: Secondary | ICD-10-CM

## 2017-09-30 DIAGNOSIS — Z720 Tobacco use: Secondary | ICD-10-CM

## 2017-09-30 DIAGNOSIS — I129 Hypertensive chronic kidney disease with stage 1 through stage 4 chronic kidney disease, or unspecified chronic kidney disease: Secondary | ICD-10-CM | POA: Diagnosis not present

## 2017-10-18 DIAGNOSIS — I129 Hypertensive chronic kidney disease with stage 1 through stage 4 chronic kidney disease, or unspecified chronic kidney disease: Secondary | ICD-10-CM | POA: Diagnosis not present

## 2017-10-19 ENCOUNTER — Encounter: Payer: Self-pay | Admitting: Family

## 2017-10-19 ENCOUNTER — Ambulatory Visit (INDEPENDENT_AMBULATORY_CARE_PROVIDER_SITE_OTHER): Payer: Medicare Other | Admitting: Family

## 2017-10-19 ENCOUNTER — Other Ambulatory Visit (INDEPENDENT_AMBULATORY_CARE_PROVIDER_SITE_OTHER): Payer: Medicare Other

## 2017-10-19 VITALS — BP 140/90 | HR 94 | Temp 98.4°F | Ht 68.75 in | Wt 235.0 lb

## 2017-10-19 DIAGNOSIS — R1012 Left upper quadrant pain: Secondary | ICD-10-CM | POA: Diagnosis not present

## 2017-10-19 DIAGNOSIS — E079 Disorder of thyroid, unspecified: Secondary | ICD-10-CM

## 2017-10-19 DIAGNOSIS — Z1159 Encounter for screening for other viral diseases: Secondary | ICD-10-CM | POA: Diagnosis not present

## 2017-10-19 DIAGNOSIS — Z13220 Encounter for screening for lipoid disorders: Secondary | ICD-10-CM | POA: Diagnosis not present

## 2017-10-19 DIAGNOSIS — Z1211 Encounter for screening for malignant neoplasm of colon: Secondary | ICD-10-CM

## 2017-10-19 DIAGNOSIS — I10 Essential (primary) hypertension: Secondary | ICD-10-CM

## 2017-10-19 DIAGNOSIS — Z23 Encounter for immunization: Secondary | ICD-10-CM

## 2017-10-19 DIAGNOSIS — Z114 Encounter for screening for human immunodeficiency virus [HIV]: Secondary | ICD-10-CM | POA: Diagnosis not present

## 2017-10-19 LAB — COMPREHENSIVE METABOLIC PANEL
ALBUMIN: 4.2 g/dL (ref 3.5–5.2)
ALK PHOS: 59 U/L (ref 39–117)
ALT: 11 U/L (ref 0–53)
AST: 12 U/L (ref 0–37)
BILIRUBIN TOTAL: 0.4 mg/dL (ref 0.2–1.2)
BUN: 9 mg/dL (ref 6–23)
CO2: 30 mEq/L (ref 19–32)
Calcium: 9.5 mg/dL (ref 8.4–10.5)
Chloride: 104 mEq/L (ref 96–112)
Creatinine, Ser: 1.16 mg/dL (ref 0.40–1.50)
GFR: 82.42 mL/min (ref >60.00)
Glucose, Bld: 93 mg/dL (ref 70–99)
POTASSIUM: 3.9 meq/L (ref 3.5–5.1)
Sodium: 141 mEq/L (ref 135–145)
TOTAL PROTEIN: 7 g/dL (ref 6.0–8.3)

## 2017-10-19 LAB — CBC WITH DIFFERENTIAL/PLATELET
BASOS ABS: 0.1 10*3/uL (ref 0.0–0.1)
Basophils Relative: 0.9 % (ref 0.0–3.0)
EOS PCT: 1.3 % (ref 0.0–5.0)
Eosinophils Absolute: 0.1 10*3/uL (ref 0.0–0.7)
HCT: 42.5 % (ref 39.0–52.0)
HEMOGLOBIN: 14.3 g/dL (ref 13.0–17.0)
Lymphocytes Relative: 24.2 % (ref 12.0–46.0)
Lymphs Abs: 2.2 10*3/uL (ref 0.7–4.0)
MCHC: 33.6 g/dL (ref 30.0–36.0)
MCV: 89.4 fl (ref 78.0–100.0)
MONOS PCT: 9.4 % (ref 3.0–12.0)
Monocytes Absolute: 0.9 10*3/uL (ref 0.1–1.0)
Neutro Abs: 5.8 10*3/uL (ref 1.4–7.7)
Neutrophils Relative %: 64.2 % (ref 43.0–77.0)
Platelets: 263 10*3/uL (ref 150.0–400.0)
RBC: 4.75 Mil/uL (ref 4.22–5.81)
RDW: 14.8 % (ref 11.5–15.5)
WBC: 9.1 10*3/uL (ref 4.0–10.5)

## 2017-10-19 LAB — LIPASE: LIPASE: 48 U/L (ref 11.0–59.0)

## 2017-10-19 LAB — LIPID PANEL
CHOL/HDL RATIO: 4
Cholesterol: 180 mg/dL (ref 0–200)
HDL: 47.8 mg/dL (ref >39.00)
LDL CALC: 110 mg/dL — AB (ref 0–99)
NonHDL: 131.71
Triglycerides: 109 mg/dL (ref 0.0–149.0)
VLDL: 21.8 mg/dL (ref 0.0–40.0)

## 2017-10-19 LAB — TSH: TSH: 1.43 u[IU]/mL (ref 0.35–4.50)

## 2017-10-19 LAB — AMYLASE: AMYLASE: 85 U/L (ref 27–131)

## 2017-10-19 MED ORDER — PANTOPRAZOLE SODIUM 40 MG PO TBEC: 40.0000 mg | DELAYED_RELEASE_TABLET | Freq: Every day | ORAL | 6 refills | Status: DC

## 2017-10-19 MED ORDER — KLOR-CON M20 20 MEQ PO TBCR: 20.0000 meq | EXTENDED_RELEASE_TABLET | Freq: Every day | ORAL | 6 refills | Status: DC

## 2017-10-19 MED ORDER — CYCLOBENZAPRINE HCL 10 MG PO TABS: 10.0000 mg | ORAL_TABLET | Freq: Every day | ORAL | 3 refills | Status: DC

## 2017-10-19 MED ORDER — AMLODIPINE BESYLATE 5 MG PO TABS: 5.0000 mg | ORAL_TABLET | Freq: Every day | ORAL | 6 refills | Status: DC

## 2017-10-19 MED ORDER — FLUPHENAZINE HCL 10 MG PO TABS: 20.0000 mg | ORAL_TABLET | Freq: Every day | ORAL | 0 refills | Status: DC

## 2017-10-19 MED ORDER — LOSARTAN POTASSIUM 50 MG PO TABS: 50.0000 mg | ORAL_TABLET | Freq: Every day | ORAL | 6 refills | Status: DC

## 2017-10-19 NOTE — Progress Notes (Signed)
[name] is a 62 y.o. male with the following history as recorded in [employer]:  Patient Active Problem List   Diagnosis Date Noted  . Pain in joint, shoulder region 04/23/2015  . Periumbilical abdominal pain 02/13/2015  . Elevated PSA 08/04/2014  . Subacromial bursitis 04/11/2014  . Neck pain on right side 03/05/2014  . Bilateral shoulder pain 03/05/2014  . Recurrent boils 03/05/2014  . Foreign body in stomach 12/31/2013  . Reflux esophagitis 12/31/2013  . Weight loss 05/06/2012  . Incisional hernia 11/23/2011  . Cramp of limb 01/13/2011  . Hyperthyroidism 08/11/2010  . Sebaceous cyst 08/10/2010  . HYPERTROPHY PROSTATE W/UR OBST & OTH LUTS 02/27/2010  . SMOKER 08/05/2009  . 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 HEMORRHOIDS, INTERNAL, WITH BLEEDING 04/26/2008  . HYPERLIPIDEMIA 12/27/2007  . LOW BACK PAIN, CHRONIC 11/08/2007  . 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 ANXIETY DISORDER, GENERALIZED 01/25/2007  . DEPRESSION 01/25/2007  . Essential hypertension 01/25/2007  . ALLERGIC RHINITIS 01/25/2007  . SCOLIOSIS NEC 01/25/2007  . Impaired glucose tolerance 01/25/2007    Current Outpatient Medications  Medication Sig Dispense Refill  . acetaminophen (TYLENOL) 500 MG tablet Take 1,000 mg by mouth 2 (two) times daily.    [name] amLODipine (NORVASC) 5 MG tablet Take 1 tablet (5 mg total) by mouth daily. 30 tablet 6  . cyclobenzaprine (FLEXERIL) 10 MG tablet Take 1 tablet (10 mg total) by mouth at bedtime. 30 tablet 3  . fluPHENAZine (PROLIXIN) 10 MG tablet Take 2 tablets (20 mg total) by mouth daily. 60 tablet 0  . KLOR-CON M20 20 MEQ tablet Take 1 tablet (20 mEq total) by mouth daily. 30 tablet 6  . losartan (COZAAR) 50 MG tablet Take 1 tablet (50 mg total) by mouth daily. 30 tablet 6  . Magnesium 250 MG TABS Take by mouth.    . oxyCODONE-acetaminophen (PERCOCET) 10-325 MG tablet TAKE 1/2 TO 1 TABLET EVERY 8-12 HOURS AS NEEDED FOR PAIN  0  . pantoprazole (PROTONIX) 40 MG  tablet Take 1 tablet (40 mg total) by mouth daily. 30 tablet 6   No current facility-administered medications for this visit.     Allergies: Celecoxib; Iohexol; Metoclopramide hcl; and Wellbutrin [bupropion]  Past Medical History:  Diagnosis Date  . ALLERGIC RHINITIS 01/25/2007  . ANXIETY DISORDER, GENERALIZED 01/25/2007  . Arthritis   . DEPRESSION 01/25/2007  . ESOPHAGITIS 12/28/2007  . Fatty liver 10/09/2010  . GERD 01/25/2007  . GLUCOSE INTOLERANCE, HX OF 01/25/2007  . Heart murmur    hx of   . HEMORRHOIDS, INTERNAL, WITH BLEEDING 04/26/2008  . Hiatal hernia   . HYPERLIPIDEMIA 12/27/2007  . HYPERTENSION, ESSENTIAL NOS 01/25/2007  . Hyperthyroidism 08/11/2010  . HYPERTROPHY PROSTATE W/UR OBST & OTH LUTS 02/27/2010  . 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 LOW BACK PAIN, CHRONIC 11/08/2007  . Pancreatitis   . Rectal abscess   . 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 SCOLIOSIS NEC 01/25/2007  . Sebaceous cyst 08/10/2010  . SMOKER 08/05/2009  . UPPER GASTROINTESTINAL HEMORRHAGE 01/25/2007    Past Surgical History:  Procedure Laterality Date  . ABDOMINAL EXPLORATION SURGERY  1988  . CHOLECYSTECTOMY  05/20/08   Dr. Zella Richer  . COLONOSCOPY  04/23/2010   normal  . ESOPHAGOGASTRODUODENOSCOPY N/A 12/31/2013   Procedure: ESOPHAGOGASTRODUODENOSCOPY (EGD);  Surgeon: Gatha Mayer, MD;  Location: [facility];  Service: Endoscopy;  Laterality: N/A;  . Excision of scalp lesion  07/2009  . Excision of thyroid mass    . GANGLION CYST  EXCISION     right foot x 2  . HERNIA REPAIR     ventral  . INCISIONAL HERNIA REPAIR  12/01/2011   Procedure: LAPAROSCOPIC INCISIONAL HERNIA;  Surgeon: Harl Bowie, MD;  Location: [facility];  Service: General;  Laterality: N/A;  . SHOULDER SURGERY     right    Family History  Problem Relation Age of Onset  . Hypertension Father   . Hypertension Mother   . Prostate cancer Other   . Colon cancer Neg Hx     Social History   Tobacco Use  . Smoking status:  Current Every Day Smoker    Packs/day: 1.50    Years: 30.00    Pack years: 45.00    Types: Cigarettes  . Smokeless tobacco: Never Used  Substance Use Topics  . Alcohol use: No    Subjective:  Patient presents to re-establish care with our office; has not been seen here since 2017; wife does accompany him and helps provide some of the history; 1) History of HTN/ chronic kidney disease- under care of nephrology; had labs done yesterday; planning to see them again in September;  2) Prostate [facility]; sees them every 6 month; 3) Degenerative Disc Disease- under care of orthopedist at [facility]; does ask if he can get a refill on his Oxycodone today and Flexeril;  4) History of Bipolar Disorder/ Schizophrenic- planning to go back to his psychiatrist; wonders about short-term refill on his medication until he can get back to his psychiatrist 5) Unsure of date of last colonoscopy; care has been managed by [facility]; comfortable with updating referral 6) Smoker- knows he needs to quit smoking; admits not ready to quit smoking; 7) Agreeable to updating vaccines today;  8) GERD- needs refill on Protonix; takes daily magnesium supplement; 9) History of thyroid cyst removed 20 years ago- requesting to have his thyroid level checked today   Objective:  Vitals:   10/19/17 0920  BP: 140/90  Pulse: 94  Temp: 98.4 F (36.9 C)  TempSrc: Oral  SpO2: 97%  Weight: 235 lb 0.6 oz (106.6 kg)  Height: 5' 8.75" (1.746 m)    General: Well developed, well nourished, in no acute distress  Skin : Warm and dry.  Head: Normocephalic and atraumatic  Lungs: Respirations unlabored; clear to auscultation bilaterally without wheeze, rales, rhonchi  CVS exam: normal rate and regular rhythm.  Musculoskeletal: No deformities; no active joint inflammation  Extremities: No edema, cyanosis, clubbing  Vessels: Symmetric bilaterally  Neurologic: Alert and oriented; speech intact; face symmetrical;  moves all extremities well; CNII-XII intact without focal deficit   Assessment:  1. Essential hypertension   2. Encounter for screening colonoscopy   3. Lipid screening   4. Thyroid disease   5. Encounter for screening for HIV   6. Need for hepatitis C screening test   7. LUQ pain     Plan:  1. Stable; refills updated; seeing nephrology; asked patient to get records forwarded for review; 2. Refer back to GI; 3. Update lipid screen today; 4. Check TSH today; 5. Update HIV; 6. Check Hep C status; 7. History of pancreatitis- check amylase and lipase today;  Will give one month refill on his Prolixin until he can get back to his psychiatrist;   Return in about 6 months (around 04/21/2018).  Orders Placed This Encounter  Procedures  . Tdap vaccine greater than or equal to 7yo IM  . Pneumococcal conjugate vaccine 13-valent  . Comp Met (  CMET)    Standing Status:   Future    Number of Occurrences:   1    Standing Expiration Date:   10/19/2018  . CBC w/Diff    Standing Status:   Future    Number of Occurrences:   1    Standing Expiration Date:   10/19/2018  . Lipid panel    Standing Status:   Future    Number of Occurrences:   1    Standing Expiration Date:   10/20/2018  . TSH    Standing Status:   Future    Number of Occurrences:   1    Standing Expiration Date:   10/19/2018  . Hepatitis C Antibody    Standing Status:   Future    Number of Occurrences:   1    Standing Expiration Date:   10/19/2018  . HIV antibody    Standing Status:   Future    Number of Occurrences:   1    Standing Expiration Date:   10/20/2018  . Amylase    Standing Status:   Future    Number of Occurrences:   1    Standing Expiration Date:   10/19/2018  . Lipase    Standing Status:   Future    Number of Occurrences:   1    Standing Expiration Date:   10/19/2018  . Ambulatory referral to Gastroenterology    Referral Priority:   Routine    Referral Type:   Consultation    Referral Reason:   Specialty Services  Required    Number of Visits Requested:   1    Requested Prescriptions   Signed Prescriptions Disp Refills  . losartan (COZAAR) 50 MG tablet 30 tablet 6    Sig: Take 1 tablet (50 mg total) by mouth daily.  [name] amLODipine (NORVASC) 5 MG tablet 30 tablet 6    Sig: Take 1 tablet (5 mg total) by mouth daily.  . pantoprazole (PROTONIX) 40 MG tablet 30 tablet 6    Sig: Take 1 tablet (40 mg total) by mouth daily.  [name] KLOR-CON M20 20 MEQ tablet 30 tablet 6    Sig: Take 1 tablet (20 mEq total) by mouth daily.  . cyclobenzaprine (FLEXERIL) 10 MG tablet 30 tablet 3    Sig: Take 1 tablet (10 mg total) by mouth at bedtime.  . fluPHENAZine (PROLIXIN) 10 MG tablet 60 tablet 0    Sig: Take 2 tablets (20 mg total) by mouth daily.

## 2017-10-19 NOTE — Patient Instructions (Signed)
Steps to Quit Smoking Smoking tobacco can be bad for your health. It can also affect almost every organ in your body. Smoking puts you and people around you at risk for many serious long-lasting (chronic) diseases. Quitting smoking is hard, but it is one of the best things that you can do for your health. It is never too late to quit. What are the benefits of quitting smoking? When you quit smoking, you lower your risk for getting serious diseases and conditions. They can include:  Lung cancer or lung disease.  Heart disease.  Stroke.  Heart attack.  Not being able to have children (infertility).  Weak bones (osteoporosis) and broken bones (fractures).  If you have coughing, wheezing, and shortness of breath, those symptoms may get better when you quit. You may also get sick less often. If you are pregnant, quitting smoking can help to lower your chances of having a baby of low birth weight. What can I do to help me quit smoking? Talk with your doctor about what can help you quit smoking. Some things you can do (strategies) include:  Quitting smoking totally, instead of slowly cutting back how much you smoke over a period of time.  Going to in-person counseling. You are more likely to quit if you go to many counseling sessions.  Using resources and support systems, such as: ? Online chats with a counselor. ? Phone quitlines. ? Printed self-help materials. ? Support groups or group counseling. ? Text messaging programs. ? Mobile phone apps or applications.  Taking medicines. Some of these medicines may have nicotine in them. If you are pregnant or breastfeeding, do not take any medicines to quit smoking unless your doctor says it is okay. Talk with your doctor about counseling or other things that can help you.  Talk with your doctor about using more than one strategy at the same time, such as taking medicines while you are also going to in-person counseling. This can help make  quitting easier. What things can I do to make it easier to quit? Quitting smoking might feel very hard at first, but there is a lot that you can do to make it easier. Take these steps:  Talk to your family and friends. Ask them to support and encourage you.  Call phone quitlines, reach out to support groups, or work with a counselor.  Ask people who smoke to not smoke around you.  Avoid places that make you want (trigger) to smoke, such as: ? Bars. ? Parties. ? Smoke-break areas at work.  Spend time with people who do not smoke.  Lower the stress in your life. Stress can make you want to smoke. Try these things to help your stress: ? Getting regular exercise. ? Deep-breathing exercises. ? Yoga. ? Meditating. ? Doing a body scan. To do this, close your eyes, focus on one area of your body at a time from head to toe, and notice which parts of your body are tense. Try to relax the muscles in those areas.  Download or buy apps on your mobile phone or tablet that can help you stick to your quit plan. There are many free apps, such as QuitGuide from the CDC (Centers for Disease Control and Prevention). You can find more support from smokefree.gov and other websites.  This information is not intended to replace advice given to you by your health care provider. Make sure you discuss any questions you have with your health care provider. Document Released: 01/30/2009 Document   Revised: 12/02/2015 Document Reviewed: 08/20/2014 Elsevier Interactive Patient Education  2018 [employer] Inc.  

## 2017-10-20 LAB — HEPATITIS C ANTIBODY
Hepatitis C Ab: NONREACTIVE
SIGNAL TO CUT-OFF: 0.14 (ref <1.00)

## 2017-10-20 LAB — HIV ANTIBODY (ROUTINE TESTING W REFLEX): HIV 1&2 Ab, 4th Generation: NONREACTIVE

## 2017-10-21 ENCOUNTER — Other Ambulatory Visit: Payer: Self-pay | Admitting: Family

## 2017-10-21 MED ORDER — LOSARTAN POTASSIUM 100 MG PO TABS: 100.0000 mg | ORAL_TABLET | Freq: Every day | ORAL | 0 refills | Status: DC

## 2017-10-21 MED ORDER — ROSUVASTATIN CALCIUM 10 MG PO TABS: 10.0000 mg | ORAL_TABLET | Freq: Every day | ORAL | 0 refills | Status: DC

## 2017-10-24 ENCOUNTER — Telehealth: Payer: Self-pay

## 2017-10-24 NOTE — Telephone Encounter (Signed)
ROI fax over to [facility] for records

## 2017-10-26 DIAGNOSIS — C61 Malignant neoplasm of prostate: Secondary | ICD-10-CM | POA: Diagnosis not present

## 2017-11-01 DIAGNOSIS — R3912 Poor urinary stream: Secondary | ICD-10-CM | POA: Diagnosis not present

## 2017-11-01 DIAGNOSIS — N5201 Erectile dysfunction due to arterial insufficiency: Secondary | ICD-10-CM | POA: Diagnosis not present

## 2017-11-01 DIAGNOSIS — C61 Malignant neoplasm of prostate: Secondary | ICD-10-CM | POA: Diagnosis not present

## 2017-11-03 DIAGNOSIS — F200 Paranoid schizophrenia: Secondary | ICD-10-CM | POA: Diagnosis not present

## 2017-11-21 ENCOUNTER — Encounter: Payer: Self-pay | Admitting: Family

## 2017-11-21 ENCOUNTER — Ambulatory Visit (INDEPENDENT_AMBULATORY_CARE_PROVIDER_SITE_OTHER): Payer: Medicare Other | Admitting: Family

## 2017-11-21 VITALS — BP 158/99 | HR 91 | Temp 98.2°F | Ht 68.75 in | Wt 236.1 lb

## 2017-11-21 DIAGNOSIS — I10 Essential (primary) hypertension: Secondary | ICD-10-CM | POA: Diagnosis not present

## 2017-11-21 DIAGNOSIS — E782 Mixed hyperlipidemia: Secondary | ICD-10-CM | POA: Diagnosis not present

## 2017-11-21 DIAGNOSIS — Z720 Tobacco use: Secondary | ICD-10-CM | POA: Diagnosis not present

## 2017-11-21 MED ORDER — LOSARTAN POTASSIUM 100 MG PO TABS: 100.0000 mg | ORAL_TABLET | Freq: Every day | ORAL | 6 refills | Status: DC

## 2017-11-21 MED ORDER — ROSUVASTATIN CALCIUM 10 MG PO TABS: 10.0000 mg | ORAL_TABLET | Freq: Every day | ORAL | 6 refills | Status: DC

## 2017-11-21 MED ORDER — AMLODIPINE BESYLATE 5 MG PO TABS: 5.0000 mg | ORAL_TABLET | Freq: Every day | ORAL | 6 refills | Status: DC

## 2017-11-21 NOTE — Progress Notes (Signed)
[name] is a 62 y.o. male with the following history as recorded in EpicCare:  Patient Active Problem List   Diagnosis Date Noted  . Pain in joint, shoulder region 04/23/2015  . Periumbilical abdominal pain 02/13/2015  . Elevated PSA 08/04/2014  . Subacromial bursitis 04/11/2014  . Neck pain on right side 03/05/2014  . Bilateral shoulder pain 03/05/2014  . Recurrent boils 03/05/2014  . Foreign body in stomach 12/31/2013  . Reflux esophagitis 12/31/2013  . Weight loss 05/06/2012  . Incisional hernia 11/23/2011  . Cramp of limb 01/13/2011  . Hyperthyroidism 08/11/2010  . Sebaceous cyst 08/10/2010  . HYPERTROPHY PROSTATE W/UR OBST & OTH LUTS 02/27/2010  . SMOKER 08/05/2009  . 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 HEMORRHOIDS, INTERNAL, WITH BLEEDING 04/26/2008  . HYPERLIPIDEMIA 12/27/2007  . LOW BACK PAIN, CHRONIC 11/08/2007  . 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 ANXIETY DISORDER, GENERALIZED 01/25/2007  . DEPRESSION 01/25/2007  . Essential hypertension 01/25/2007  . ALLERGIC RHINITIS 01/25/2007  . SCOLIOSIS NEC 01/25/2007  . Impaired glucose tolerance 01/25/2007    Current Outpatient Medications  Medication Sig Dispense Refill  . acetaminophen (TYLENOL) 500 MG tablet Take 1,000 mg by mouth 2 (two) times daily.    [name] amLODipine (NORVASC) 5 MG tablet Take 1 tablet (5 mg total) by mouth daily. 30 tablet 6  . cyclobenzaprine (FLEXERIL) 10 MG tablet Take 1 tablet (10 mg total) by mouth at bedtime. 30 tablet 3  . fluPHENAZine (PROLIXIN) 10 MG tablet Take 2 tablets (20 mg total) by mouth daily. 60 tablet 0  . KLOR-CON M20 20 MEQ tablet Take 1 tablet (20 mEq total) by mouth daily. 30 tablet 6  . losartan (COZAAR) 100 MG tablet Take 1 tablet (100 mg total) by mouth daily. 30 tablet 6  . Magnesium 250 MG TABS Take by mouth.    . oxyCODONE-acetaminophen (PERCOCET) 10-325 MG tablet TAKE 1/2 TO 1 TABLET EVERY 8-12 HOURS AS NEEDED FOR PAIN  0  . pantoprazole (PROTONIX) 40 MG  tablet Take 1 tablet (40 mg total) by mouth daily. 30 tablet 6  . rosuvastatin (CRESTOR) 10 MG tablet Take 1 tablet (10 mg total) by mouth daily. 30 tablet 6   No current facility-administered medications for this visit.     Allergies: Celecoxib; Iohexol; Metoclopramide hcl; and Wellbutrin [bupropion]  Past Medical History:  Diagnosis Date  . ALLERGIC RHINITIS 01/25/2007  . ANXIETY DISORDER, GENERALIZED 01/25/2007  . Arthritis   . DEPRESSION 01/25/2007  . ESOPHAGITIS 12/28/2007  . Fatty liver 10/09/2010  . GERD 01/25/2007  . GLUCOSE INTOLERANCE, HX OF 01/25/2007  . Heart murmur    hx of   . HEMORRHOIDS, INTERNAL, WITH BLEEDING 04/26/2008  . Hiatal hernia   . HYPERLIPIDEMIA 12/27/2007  . HYPERTENSION, ESSENTIAL NOS 01/25/2007  . Hyperthyroidism 08/11/2010  . HYPERTROPHY PROSTATE W/UR OBST & OTH LUTS 02/27/2010  . 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 LOW BACK PAIN, CHRONIC 11/08/2007  . Pancreatitis   . Rectal abscess   . 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 SCOLIOSIS NEC 01/25/2007  . Sebaceous cyst 08/10/2010  . SMOKER 08/05/2009  . UPPER GASTROINTESTINAL HEMORRHAGE 01/25/2007    Past Surgical History:  Procedure Laterality Date  . ABDOMINAL EXPLORATION SURGERY  1988  . CHOLECYSTECTOMY  05/20/08   Dr. Zella Richer  . COLONOSCOPY  04/23/2010   normal  . ESOPHAGOGASTRODUODENOSCOPY N/A 12/31/2013   Procedure: ESOPHAGOGASTRODUODENOSCOPY (EGD);  Surgeon: Gatha Mayer, MD;  Location: [facility];  Service: Endoscopy;  Laterality: N/A;  .  Excision of scalp lesion  07/2009  . Excision of thyroid mass    . GANGLION CYST EXCISION     right foot x 2  . HERNIA REPAIR     ventral  . INCISIONAL HERNIA REPAIR  12/01/2011   Procedure: LAPAROSCOPIC INCISIONAL HERNIA;  Surgeon: Harl Bowie, MD;  Location: [facility];  Service: General;  Laterality: N/A;  . SHOULDER SURGERY     right    Family History  Problem Relation Age of Onset  . Hypertension Father   . Hypertension Mother   . Prostate  cancer Other   . Colon cancer Neg Hx     Social History   Tobacco Use  . Smoking status: Current Every Day Smoker    Packs/day: 1.50    Years: 30.00    Pack years: 45.00    Types: Cigarettes  . Smokeless tobacco: Never Used  Substance Use Topics  . Alcohol use: No    Subjective:  Patient presents to follow-up on his hypertension; is only taking his Losartan 100 mg; did not understand that he was supposed to stay on Amlodipine 5 mg also; Denies any chest pain, shortness of breath, blurred vision or headache.  Has not started his Crestor 10 mg due to cost issues; Will not have prescriptive coverage until next month; does plan to start the medication at that time.  Has been able to re-establish with his psychiatrist- prescription for Prolixin will be managed by that provider.  Still smoking cigarettes- not ready to quit smoking;   Requesting "inhaler" which he has used in the past to help with his breathing. Unable to tell me what medication he has used;   Objective:  Vitals:   11/21/17 0943  BP: (!) 158/99  Pulse: 91  Temp: 98.2 F (36.8 C)  TempSrc: Oral  SpO2: 99%  Weight: 236 lb 1.3 oz (107.1 kg)  Height: 5' 8.75" (1.746 m)    General: Well developed, well nourished, in no acute distress  Skin : Warm and dry.  Head: Normocephalic and atraumatic  Eyes: Sclera and conjunctiva clear; pupils round and reactive to light; extraocular movements intact  Ears: External normal; canals clear; tympanic membranes normal  Oropharynx: Pink, supple. No suspicious lesions  Neck: Supple without thyromegaly, adenopathy  Lungs: Respirations unlabored; clear to auscultation bilaterally without wheeze, rales, rhonchi  CVS exam: normal rate and regular rhythm.  Neurologic: Alert and oriented; speech intact; face symmetrical; moves all extremities well; CNII-XII intact without focal deficit   Assessment:  1. Essential hypertension   2. Mixed hyperlipidemia   3. Tobacco abuse     Plan:   1. Uncontrolled; not taking medications as prescribed; stressed that needs to be on both Losartan and Amlodipine; refills updated; follow-up in 2 months; 2. Start Crestor once insurance is in place- follow-up in 2 months and will re-check labs then; 3. Patient not interested in quitting smoking; suspect underlying COPD; sample given of Dulera 100 to use as directed; patient cannot afford other medication at this time and this was only sample available at time of office.  Return in about 2 months (around 01/21/2018).  No orders of the defined types were placed in this encounter.   Requested Prescriptions   Signed Prescriptions Disp Refills  . amLODipine (NORVASC) 5 MG tablet 30 tablet 6    Sig: Take 1 tablet (5 mg total) by mouth daily.  [name] losartan (COZAAR) 100 MG tablet 30 tablet 6    Sig: Take 1 tablet (100 mg  total) by mouth daily.  . rosuvastatin (CRESTOR) 10 MG tablet 30 tablet 6    Sig: Take 1 tablet (10 mg total) by mouth daily.

## 2017-12-21 ENCOUNTER — Ambulatory Visit (INDEPENDENT_AMBULATORY_CARE_PROVIDER_SITE_OTHER): Payer: Medicare Other | Admitting: Family Medicine

## 2017-12-21 ENCOUNTER — Encounter: Payer: Self-pay | Admitting: Family Medicine

## 2017-12-21 ENCOUNTER — Ambulatory Visit: Payer: Self-pay

## 2017-12-21 VITALS — BP 128/78 | HR 94 | Ht 68.0 in | Wt 236.0 lb

## 2017-12-21 DIAGNOSIS — M25521 Pain in right elbow: Secondary | ICD-10-CM

## 2017-12-21 DIAGNOSIS — M7711 Lateral epicondylitis, right elbow: Secondary | ICD-10-CM

## 2017-12-21 NOTE — Progress Notes (Signed)
[name] [address] [place], [place] [address] Phone: [phone] Subjective:   Fontaine No, am serving as a scribe for Dr. Hulan Saas.  I'm seeing this patient by the request  of:  Marrian Salvage, FNP   CC: Right forearm pain  WIO:[id number]  [name] is a 62 y.o. male coming in with complaint of right forearm pain. He was told that his pain is coming from his neck.Chronic, constant pain with all movements. Feels a pop in the elbow. Is right handed. Does have numbness and tingling in his hand intermittently. Also notes that he has been diagnosed with carpel tunnel bilaterally. Does try to strengthen and stretch. Has used topical analgesic and has taken Percocet to alleviate pain.      Past Medical History:  Diagnosis Date  . ALLERGIC RHINITIS 01/25/2007  . ANXIETY DISORDER, GENERALIZED 01/25/2007  . Arthritis   . DEPRESSION 01/25/2007  . ESOPHAGITIS 12/28/2007  . Fatty liver 10/09/2010  . GERD 01/25/2007  . GLUCOSE INTOLERANCE, HX OF 01/25/2007  . Heart murmur    hx of   . HEMORRHOIDS, INTERNAL, WITH BLEEDING 04/26/2008  . Hiatal hernia   . HYPERLIPIDEMIA 12/27/2007  . HYPERTENSION, ESSENTIAL NOS 01/25/2007  . Hyperthyroidism 08/11/2010  . HYPERTROPHY PROSTATE W/UR OBST & OTH LUTS 02/27/2010  . 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 LOW BACK PAIN, CHRONIC 11/08/2007  . Pancreatitis   . Rectal abscess   . 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 SCOLIOSIS NEC 01/25/2007  . Sebaceous cyst 08/10/2010  . SMOKER 08/05/2009  . UPPER GASTROINTESTINAL HEMORRHAGE 01/25/2007   Past Surgical History:  Procedure Laterality Date  . ABDOMINAL EXPLORATION SURGERY  1988  . CHOLECYSTECTOMY  05/20/08   Dr. Zella Richer  . COLONOSCOPY  04/23/2010   normal  . ESOPHAGOGASTRODUODENOSCOPY N/A 12/31/2013   Procedure: ESOPHAGOGASTRODUODENOSCOPY (EGD);  Surgeon: Gatha Mayer, MD;  Location: [facility];  Service: Endoscopy;  Laterality: N/A;  . Excision  of scalp lesion  07/2009  . Excision of thyroid mass    . GANGLION CYST EXCISION     right foot x 2  . HERNIA REPAIR     ventral  . INCISIONAL HERNIA REPAIR  12/01/2011   Procedure: LAPAROSCOPIC INCISIONAL HERNIA;  Surgeon: Harl Bowie, MD;  Location: [facility];  Service: General;  Laterality: N/A;  . SHOULDER SURGERY     right   Social History   Socioeconomic History  . Marital status: Married    Spouse name: Not on file  . Number of children: 2  . Years of education: Not on file  . Highest education level: Not on file  Occupational History  . Occupation: Disabled    Employer: DISABLED  Social Needs  . Financial resource strain: Not on file  . Food insecurity:    Worry: Not on file    Inability: Not on file  . Transportation needs:    Medical: Not on file    Non-medical: Not on file  Tobacco Use  . Smoking status: Current Every Day Smoker    Packs/day: 1.50    Years: 30.00    Pack years: 45.00    Types: Cigarettes  . Smokeless tobacco: Never Used  Substance and Sexual Activity  . Alcohol use: No  . Drug use: Yes    Frequency: 14.0 times per week    Types: Marijuana  . Sexual activity: Not on file  Lifestyle  . Physical activity:    Days per week:  Not on file    Minutes per session: Not on file  . Stress: Not on file  Relationships  . Social connections:    Talks on phone: Not on file    Gets together: Not on file    Attends religious service: Not on file    Active member of club or organization: Not on file    Attends meetings of clubs or organizations: Not on file    Relationship status: Not on file  Other Topics Concern  . Not on file  Social History Narrative   HSG disabled due to schizophrenia - 26. stable long-term marriage with children. cared for his father-in-law- passed away in 07-12-22   Allergies  Allergen Reactions  . Celecoxib Other (See Comments)     rectal bleeding  . Iohexol Other (See Comments)    Affects nerves: triggers schizophrenia   . Metoclopramide Hcl Other (See Comments)    Affects nerves: triggers schizophrenia  . Wellbutrin [Bupropion] Other (See Comments)    Makes pt smoke heavily   Family History  Problem Relation Age of Onset  . Hypertension Father   . Hypertension Mother   . Prostate cancer Other   . Colon cancer Neg Hx      Current Outpatient Medications (Cardiovascular):  .  amLODipine (NORVASC) 5 MG tablet, Take 1 tablet (5 mg total) by mouth daily. [name]  losartan (COZAAR) 100 MG tablet, Take 1 tablet (100 mg total) by mouth daily. .  rosuvastatin (CRESTOR) 10 MG tablet, Take 1 tablet (10 mg total) by mouth daily.   Current Outpatient Medications (Analgesics):  .  acetaminophen (TYLENOL) 500 MG tablet, Take 1,000 mg by mouth 2 (two) times daily. [name]  oxyCODONE-acetaminophen (PERCOCET) 10-325 MG tablet, TAKE 1/2 TO 1 TABLET EVERY 8-12 HOURS AS NEEDED FOR PAIN   Current Outpatient Medications (Other):  .  cyclobenzaprine (FLEXERIL) 10 MG tablet, Take 1 tablet (10 mg total) by mouth at bedtime. .  fluPHENAZine (PROLIXIN) 10 MG tablet, Take 2 tablets (20 mg total) by mouth daily. [name]  KLOR-CON M20 20 MEQ tablet, Take 1 tablet (20 mEq total) by mouth daily. .  Magnesium 250 MG TABS, Take by mouth. .  pantoprazole (PROTONIX) 40 MG tablet, Take 1 tablet (40 mg total) by mouth daily.    Past medical history, social, surgical and family history all reviewed in electronic medical record.  No pertanent information unless stated regarding to the chief complaint.   Review of Systems:  No headache, visual changes, nausea, vomiting, diarrhea, constipation, dizziness, abdominal pain, skin rash, fevers, chills, night sweats, weight loss, swollen lymph nodes, body aches, joint swelling, chest pain, shortness of breath, mood changes.  Positive muscle aches  Objective  Blood pressure 128/78, pulse 94, height 5\' 8"  (1.727 m), weight 236 lb (107 kg), SpO2 96 %.    General: Anxious but no apparent distress alert and  oriented x3 mood and affect normal, dressed appropriately.  Minorly disheveled HEENT: Pupils equal, extraocular movements intact  Respiratory: Patient's speak in full sentences and does not appear short of breath patient does have significant amount of discharge coming from the nares Cardiovascular: No lower extremity edema, non tender, no erythema  Skin: Warm dry intact with no signs of infection or rash on extremities or on axial skeleton.  Abdomen: Soft nontender  Neuro: Cranial nerves II through XII are intact, neurovascularly intact in all extremities with 2+ DTRs and 2+ pulses.  Lymph: No lymphadenopathy of posterior or anterior cervical chain or axillae bilaterally.  Gait normal with good balance and coordination.  MSK:  Non tender with full range of motion and good stability and symmetric strength and tone of shoulders, , wrist, hip, knee and ankles bilaterally.  Elbow: Right Unremarkable to inspection. Range of motion full pronation, supination, flexion, extension. Strength is full to all of the above directions Stable to varus, valgus stress. Negative moving valgus stress test. Tender over the lateral epicondylar region especially with wrist extension against resistance Ulnar nerve does not sublux. Negative cubital tunnel Tinel's. Contralateral elbow unremarkable  Musculoskeletal ultrasound was performed and interpreted by Charlann Boxer D.O.   Elbow: Right elbow Lateral epicondylar region motion of the patient has significant hypoechoic changes and some increased irritation of the surrounding neurovascular bundle.  No true tear noted by patient does have significant tendinopathy  IMPRESSION: Lateral epicondylitis.  [id number]; 15 additional minutes spent for Therapeutic exercises as stated in above notes.  This included exercises focusing on stretching, strengthening, with significant focus on eccentric aspects.   Long term goals include an improvement in range of motion, strength,  endurance as well as avoiding reinjury. Patient's frequency would include in 1-2 times a day, 3-5 times a week for a duration of 6-12 weeks. Lateral Epicondylitis: Elbow anatomy was reviewed, and tendinopathy was explained.  Pt. given a formal rehab program. Series of concentric and eccentric exercises should be done starting with no weight, work up to 1 lb, hammer, etc.  Use counterforce strap if working or using hands.  Formal PT would be beneficial. Emphasized stretching an cross-friction massage Emphasized proper palms up lifting biomechanics to unload ECRB  Proper technique shown and discussed handout in great detail with ATC.  All questions were discussed and answered.      Impression and Recommendations:     This case required medical decision making of moderate complexity. The above documentation has been reviewed and is accurate and complete Lyndal Pulley, DO       Note: This dictation was prepared with Dragon dictation along with smaller phrase technology. Any transcriptional errors that result from this process are unintentional.

## 2017-12-21 NOTE — Assessment & Plan Note (Signed)
Differential includes a cervical radiculopathy as well as patient has had carpal tunnel previously.  We discussed potential injections but patient wants to try conservative therapy.  Wrist brace given, topical anti-inflammatories, icing regimen.  Discussed avoiding lifting with overhead activities.  Follow-up again in 4 weeks

## 2017-12-21 NOTE — Patient Instructions (Addendum)
Good to see you  Ice 20 minutes 2 times daily. Usually after activity and before bed. Exercises 3 times a week.  Avoid overhand lifting.  Wear wrist brace day and night for 1 week then nightly for 2 weeks.  Ice 20 minutes 2 times daily. Usually after activity and before bed. See me again in 4-6 weeks

## 2017-12-23 ENCOUNTER — Encounter: Payer: Self-pay | Admitting: Family

## 2017-12-23 ENCOUNTER — Ambulatory Visit (INDEPENDENT_AMBULATORY_CARE_PROVIDER_SITE_OTHER): Payer: Medicare Other | Admitting: Family

## 2017-12-23 VITALS — BP 142/88 | HR 100 | Temp 98.2°F | Ht 68.0 in | Wt 235.0 lb

## 2017-12-23 DIAGNOSIS — J019 Acute sinusitis, unspecified: Secondary | ICD-10-CM

## 2017-12-23 MED ORDER — FLUTICASONE PROPIONATE 50 MCG/ACT NA SUSP: 2.0000 | Freq: Every day | NASAL | 6 refills | Status: DC

## 2017-12-23 MED ORDER — AMOXICILLIN-POT CLAVULANATE 875-125 MG PO TABS: 1.0000 | ORAL_TABLET | Freq: Two times a day (BID) | ORAL | 0 refills | Status: DC

## 2017-12-23 NOTE — Progress Notes (Signed)
[name] is a 62 y.o. male with the following history as recorded in EpicCare:  Patient Active Problem List   Diagnosis Date Noted  . Right lateral epicondylitis 12/21/2017  . Pain in joint, shoulder region 04/23/2015  . Periumbilical abdominal pain 02/13/2015  . Elevated PSA 08/04/2014  . Subacromial bursitis 04/11/2014  . Neck pain on right side 03/05/2014  . Bilateral shoulder pain 03/05/2014  . Recurrent boils 03/05/2014  . Foreign body in stomach 12/31/2013  . Reflux esophagitis 12/31/2013  . Weight loss 05/06/2012  . Incisional hernia 11/23/2011  . Cramp of limb 01/13/2011  . Hyperthyroidism 08/11/2010  . Sebaceous cyst 08/10/2010  . HYPERTROPHY PROSTATE W/UR OBST & OTH LUTS 02/27/2010  . SMOKER 08/05/2009  . 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 HEMORRHOIDS, INTERNAL, WITH BLEEDING 04/26/2008  . HYPERLIPIDEMIA 12/27/2007  . LOW BACK PAIN, CHRONIC 11/08/2007  . 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 ANXIETY DISORDER, GENERALIZED 01/25/2007  . DEPRESSION 01/25/2007  . Essential hypertension 01/25/2007  . ALLERGIC RHINITIS 01/25/2007  . SCOLIOSIS NEC 01/25/2007  . Impaired glucose tolerance 01/25/2007    Current Outpatient Medications  Medication Sig Dispense Refill  . acetaminophen (TYLENOL) 500 MG tablet Take 1,000 mg by mouth 2 (two) times daily.    [name] amLODipine (NORVASC) 5 MG tablet Take 1 tablet (5 mg total) by mouth daily. 30 tablet 6  . cyclobenzaprine (FLEXERIL) 10 MG tablet Take 1 tablet (10 mg total) by mouth at bedtime. 30 tablet 3  . fluPHENAZine (PROLIXIN) 10 MG tablet Take 2 tablets (20 mg total) by mouth daily. 60 tablet 0  . KLOR-CON M20 20 MEQ tablet Take 1 tablet (20 mEq total) by mouth daily. 30 tablet 6  . losartan (COZAAR) 100 MG tablet Take 1 tablet (100 mg total) by mouth daily. 30 tablet 6  . Magnesium 250 MG TABS Take by mouth.    . oxyCODONE-acetaminophen (PERCOCET) 10-325 MG tablet TAKE 1/2 TO 1 TABLET EVERY 8-12 HOURS AS NEEDED  FOR PAIN  0  . pantoprazole (PROTONIX) 40 MG tablet Take 1 tablet (40 mg total) by mouth daily. 30 tablet 6  . rosuvastatin (CRESTOR) 10 MG tablet Take 1 tablet (10 mg total) by mouth daily. 30 tablet 6  . amoxicillin-clavulanate (AUGMENTIN) 875-125 MG tablet Take 1 tablet by mouth 2 (two) times daily. 20 tablet 0  . fluticasone (FLONASE) 50 MCG/ACT nasal spray Place 2 sprays into both nostrils daily. 16 g 6   No current facility-administered medications for this visit.     Allergies: Celecoxib; Iohexol; Metoclopramide hcl; and Wellbutrin [bupropion]  Past Medical History:  Diagnosis Date  . ALLERGIC RHINITIS 01/25/2007  . ANXIETY DISORDER, GENERALIZED 01/25/2007  . Arthritis   . DEPRESSION 01/25/2007  . ESOPHAGITIS 12/28/2007  . Fatty liver 10/09/2010  . GERD 01/25/2007  . GLUCOSE INTOLERANCE, HX OF 01/25/2007  . Heart murmur    hx of   . HEMORRHOIDS, INTERNAL, WITH BLEEDING 04/26/2008  . Hiatal hernia   . HYPERLIPIDEMIA 12/27/2007  . HYPERTENSION, ESSENTIAL NOS 01/25/2007  . Hyperthyroidism 08/11/2010  . HYPERTROPHY PROSTATE W/UR OBST & OTH LUTS 02/27/2010  . 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 LOW BACK PAIN, CHRONIC 11/08/2007  . Pancreatitis   . Rectal abscess   . 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 SCOLIOSIS NEC 01/25/2007  . Sebaceous cyst 08/10/2010  . SMOKER 08/05/2009  . UPPER GASTROINTESTINAL HEMORRHAGE 01/25/2007    Past Surgical History:  Procedure Laterality Date  . ABDOMINAL EXPLORATION SURGERY  1988  .  CHOLECYSTECTOMY  05/20/08   Dr. Zella Richer  . COLONOSCOPY  04/23/2010   normal  . ESOPHAGOGASTRODUODENOSCOPY N/A 12/31/2013   Procedure: ESOPHAGOGASTRODUODENOSCOPY (EGD);  Surgeon: Gatha Mayer, MD;  Location: [facility];  Service: Endoscopy;  Laterality: N/A;  . Excision of scalp lesion  07/2009  . Excision of thyroid mass    . GANGLION CYST EXCISION     right foot x 2  . HERNIA REPAIR     ventral  . INCISIONAL HERNIA REPAIR  12/01/2011   Procedure: LAPAROSCOPIC  INCISIONAL HERNIA;  Surgeon: Harl Bowie, MD;  Location: [facility];  Service: General;  Laterality: N/A;  . SHOULDER SURGERY     right    Family History  Problem Relation Age of Onset  . Hypertension Father   . Hypertension Mother   . Prostate cancer Other   . Colon cancer Neg Hx     Social History   Tobacco Use  . Smoking status: Current Every Day Smoker    Packs/day: 1.50    Years: 30.00    Pack years: 45.00    Types: Cigarettes  . Smokeless tobacco: Never Used  Substance Use Topics  . Alcohol use: No    Subjective:  Patient presents with concerns for possible sinus infection; symptoms x 1 week; + sinus headache; + nasal drainage; + ears feel full; not prone to seasonal allergies; no fever; using OTC Mucinex and Robitussin;   Objective:  Vitals:   12/23/17 0842  BP: (!) 142/88  Pulse: 100  Temp: 98.2 F (36.8 C)  TempSrc: Oral  SpO2: 97%  Weight: 235 lb (106.6 kg)  Height: 5\' 8"  (1.727 m)    General: Well developed, well nourished, in no acute distress  Skin : Warm and dry.  Head: Normocephalic and atraumatic  Eyes: Sclera and conjunctiva clear; pupils round and reactive to light; extraocular movements intact  Ears: External normal; canals clear; tympanic membranes normal  Oropharynx: Pink, supple. No suspicious lesions  Neck: Supple without thyromegaly, adenopathy  Lungs: Respirations unlabored; clear to auscultation bilaterally without wheeze, rales, rhonchi  CVS exam: normal rate and regular rhythm.  Neurologic: Alert and oriented; speech intact; face symmetrical; moves all extremities well; CNII-XII intact without focal deficit   Assessment:  1. Acute sinusitis, recurrence not specified, unspecified location     Plan:  Rx for Augmentin 875 mg bid x 10 days, Flonase NS; encouraged to stop smoking; increase fluids, rest and follow-up worse, no better.   No follow-ups on file.  No orders of the defined types were placed in this encounter.   Requested  Prescriptions   Signed Prescriptions Disp Refills  . amoxicillin-clavulanate (AUGMENTIN) 875-125 MG tablet 20 tablet 0    Sig: Take 1 tablet by mouth 2 (two) times daily.  . fluticasone (FLONASE) 50 MCG/ACT nasal spray 16 g 6    Sig: Place 2 sprays into both nostrils daily.

## 2017-12-26 ENCOUNTER — Telehealth: Payer: Self-pay | Admitting: Family

## 2017-12-26 NOTE — Telephone Encounter (Signed)
Copied from [facility] [phone]. Topic: Quick Communication - See Telephone Encounter >> Dec 26, 2017  3:07 PM R[place]ford Nail, [place] wrote: CRM for notification. See Telephone encounter for: 12/26/17. Patient would like to know if [facility] could fill the patient's oxyCODONE-acetaminophen (PERCOCET) 10-325 MG tablet? States that it had been prescribed by his previous doctor and that he is in a lot of pain today because of his bulging and herniated disks. [id number] - [place], [place] [address]

## 2017-12-27 NOTE — Telephone Encounter (Signed)
He told me that his orthopedist at [facility] is managing this for him; would recommend he contact them about worsening pain.

## 2017-12-27 NOTE — Telephone Encounter (Signed)
Refill of percocet by historical provider  LOV 12/23/17 L. Valere Dross  [facility] 09/01/17

## 2017-12-27 NOTE — Telephone Encounter (Signed)
Called and left message for patient to return call to clinic. CRM created incase he returns call.

## 2018-01-05 DIAGNOSIS — I129 Hypertensive chronic kidney disease with stage 1 through stage 4 chronic kidney disease, or unspecified chronic kidney disease: Secondary | ICD-10-CM | POA: Diagnosis not present

## 2018-01-05 DIAGNOSIS — N183 Chronic kidney disease, stage 3 (moderate): Secondary | ICD-10-CM | POA: Diagnosis not present

## 2018-01-16 NOTE — Progress Notes (Signed)
[name] [address] [place], [place] [address] Phone: [phone] Subjective:    I [name] am serving as a Education administrator for Dr. Hulan Saas.   CC: Elbow neck pain  HUD:[id number]  [name] is a 62 y.o. male coming in with complaint of right elbow and neck pain. States that he is painful. Feels about the same as the last visit. Has been exercising and wearing the brace. Patient feels that more the pain is secondary to the neck itself no.  Patient rates the severity pain is 7 out of 10.  Patient states that he did have an MRI of his neck and he is even had an epidural multiple months ago.  Patient states that this was only minimally beneficial and for short time.  Patient states that the pain is unrelenting in the neck.  Feels like sometimes he has more numbness in the hands.    Past Medical History:  Diagnosis Date  . ALLERGIC RHINITIS 01/25/2007  . ANXIETY DISORDER, GENERALIZED 01/25/2007  . Arthritis   . DEPRESSION 01/25/2007  . ESOPHAGITIS 12/28/2007  . Fatty liver 10/09/2010  . GERD 01/25/2007  . GLUCOSE INTOLERANCE, HX OF 01/25/2007  . Heart murmur    hx of   . HEMORRHOIDS, INTERNAL, WITH BLEEDING 04/26/2008  . Hiatal hernia   . HYPERLIPIDEMIA 12/27/2007  . HYPERTENSION, ESSENTIAL NOS 01/25/2007  . Hyperthyroidism 08/11/2010  . HYPERTROPHY PROSTATE W/UR OBST & OTH LUTS 02/27/2010  . 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 LOW BACK PAIN, CHRONIC 11/08/2007  . Pancreatitis   . Rectal abscess   . 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 SCOLIOSIS NEC 01/25/2007  . Sebaceous cyst 08/10/2010  . SMOKER 08/05/2009  . UPPER GASTROINTESTINAL HEMORRHAGE 01/25/2007   Past Surgical History:  Procedure Laterality Date  . ABDOMINAL EXPLORATION SURGERY  1988  . CHOLECYSTECTOMY  05/20/08   Dr. Zella Richer  . COLONOSCOPY  04/23/2010   normal  . ESOPHAGOGASTRODUODENOSCOPY N/A 12/31/2013   Procedure: ESOPHAGOGASTRODUODENOSCOPY (EGD);  Surgeon: Gatha Mayer,  MD;  Location: [facility];  Service: Endoscopy;  Laterality: N/A;  . Excision of scalp lesion  07/2009  . Excision of thyroid mass    . GANGLION CYST EXCISION     right foot x 2  . HERNIA REPAIR     ventral  . INCISIONAL HERNIA REPAIR  12/01/2011   Procedure: LAPAROSCOPIC INCISIONAL HERNIA;  Surgeon: Harl Bowie, MD;  Location: [facility];  Service: General;  Laterality: N/A;  . SHOULDER SURGERY     right   Social History   Socioeconomic History  . Marital status: Married    Spouse name: Not on file  . Number of children: 2  . Years of education: Not on file  . Highest education level: Not on file  Occupational History  . Occupation: Disabled    Employer: DISABLED  Social Needs  . Financial resource strain: Not on file  . Food insecurity:    Worry: Not on file    Inability: Not on file  . Transportation needs:    Medical: Not on file    Non-medical: Not on file  Tobacco Use  . Smoking status: Current Every Day Smoker    Packs/day: 1.50    Years: 30.00    Pack years: 45.00    Types: Cigarettes  . Smokeless tobacco: Never Used  Substance and Sexual Activity  . Alcohol use: No  . Drug use: Yes    Frequency: 14.0 times per  week    Types: Marijuana  . Sexual activity: Not on file  Lifestyle  . Physical activity:    Days per week: Not on file    Minutes per session: Not on file  . Stress: Not on file  Relationships  . Social connections:    Talks on phone: Not on file    Gets together: Not on file    Attends religious service: Not on file    Active member of club or organization: Not on file    Attends meetings of clubs or organizations: Not on file    Relationship status: Not on file  Other Topics Concern  . Not on file  Social History Narrative   HSG disabled due to schizophrenia - 17. stable long-term marriage with children. cared for his father-in-law- passed away in 2022/07/17   Allergies  Allergen Reactions  . Celecoxib Other (See Comments)     rectal  bleeding  . Iohexol Other (See Comments)    Affects nerves: triggers schizophrenia  . Metoclopramide Hcl Other (See Comments)    Affects nerves: triggers schizophrenia  . Wellbutrin [Bupropion] Other (See Comments)    Makes pt smoke heavily   Family History  Problem Relation Age of Onset  . Hypertension Father   . Hypertension Mother   . Prostate cancer Other   . Colon cancer Neg Hx      Current Outpatient Medications (Cardiovascular):  .  amLODipine (NORVASC) 5 MG tablet, Take 1 tablet (5 mg total) by mouth daily. [name]  losartan (COZAAR) 100 MG tablet, Take 1 tablet (100 mg total) by mouth daily. .  rosuvastatin (CRESTOR) 10 MG tablet, Take 1 tablet (10 mg total) by mouth daily.  Current Outpatient Medications (Respiratory):  .  fluticasone (FLONASE) 50 MCG/ACT nasal spray, Place 2 sprays into both nostrils daily.  Current Outpatient Medications (Analgesics):  .  acetaminophen (TYLENOL) 500 MG tablet, Take 1,000 mg by mouth 2 (two) times daily. [name]  oxyCODONE-acetaminophen (PERCOCET) 10-325 MG tablet, TAKE 1/2 TO 1 TABLET EVERY 8-12 HOURS AS NEEDED FOR PAIN   Current Outpatient Medications (Other):  .  amoxicillin-clavulanate (AUGMENTIN) 875-125 MG tablet, Take 1 tablet by mouth 2 (two) times daily. .  cyclobenzaprine (FLEXERIL) 10 MG tablet, Take 1 tablet (10 mg total) by mouth at bedtime. .  fluPHENAZine (PROLIXIN) 10 MG tablet, Take 2 tablets (20 mg total) by mouth daily. [name]  KLOR-CON M20 20 MEQ tablet, Take 1 tablet (20 mEq total) by mouth daily. .  Magnesium 250 MG TABS, Take by mouth. .  pantoprazole (PROTONIX) 40 MG tablet, Take 1 tablet (40 mg total) by mouth daily. [name]  gabapentin (NEURONTIN) 300 MG capsule, nightly    Past medical history, social, surgical and family history all reviewed in electronic medical record.  No pertanent information unless stated regarding to the chief complaint.   Review of Systems:  No headache, visual changes, nausea, vomiting, diarrhea,  constipation, dizziness, abdominal pain, skin rash, fevers, chills, night sweats, weight loss, swollen lymph nodes, body aches, joint swelling, chest pain, shortness of breath, mood changes.  Positive muscle aches  Objective  Blood pressure 140/90, pulse 95, height 5\' 8"  (1.727 m), weight 237 lb (107.5 kg), SpO2 92 %.   General: No apparent distress alert and oriented x3 mood and affect normal, dressed appropriately.  HEENT: Pupils equal, extraocular movements intact  Respiratory: Patient's speak in full sentences and does not appear short of breath  Cardiovascular: No lower extremity edema, non tender, no erythema  Skin: Warm dry intact with no signs of infection or rash on extremities or on axial skeleton.  Abdomen: Soft nontender  Neuro: Cranial nerves II through XII are intact, neurovascularly intact in all extremities withand 2+ pulses.  Lymph: No lymphadenopathy of posterior or anterior cervical chain or axillae bilaterally.  Gait antalgic MSK:  tender with full range of motion and good stability and symmetric strength and tone of shoulders, elbows, wrist, hip, knee and ankles bilaterally.  Arthritic changes of multiple joints  Musculoskeletal ultrasound was performed and interpreted by Charlann Boxer D.O.   Elbow:  Lateral epicondyle and common extensor tendon origin was mild hypoechoic changes at the insertion with decreased vascularity from previous imaging. Radial head unremarkable and located in annular ligament Medial epicondyle and common flexor tendon origin visualized.  No edema, effusions, or avulsions seen. Ulnar nerve in cubital tunnel unremarkable. Olecranon and triceps insertion visualized and unremarkable without edema, effusion, or avulsion.  No signs olecranon bursitis. Power doppler signal normal.  IMPRESSION: Mild lateral epicondylitis   Neck: Inspection loss of lordosis. No palpable stepoffs. Positive Spurling's maneuver bilaterally with radicular symptoms in the  C7 and C8 distribution per patient Decreased range of motion in all planes Mild weakness in grip strength No sensory change to C4 to T1 Negative Hoffman sign bilaterally Reflexes 1+ of the biceps in the brachioradialis bilaterally     Impression and Recommendations:     This case required medical decision making of moderate complexity. The above documentation has been reviewed and is accurate and complete Lyndal Pulley, DO       Note: This dictation was prepared with Dragon dictation along with smaller phrase technology. Any transcriptional errors that result from this process are unintentional.

## 2018-01-18 ENCOUNTER — Encounter: Payer: Self-pay | Admitting: Family

## 2018-01-18 ENCOUNTER — Ambulatory Visit (INDEPENDENT_AMBULATORY_CARE_PROVIDER_SITE_OTHER): Payer: Medicare HMO | Admitting: Family

## 2018-01-18 ENCOUNTER — Ambulatory Visit (INDEPENDENT_AMBULATORY_CARE_PROVIDER_SITE_OTHER)
Admission: RE | Admit: 2018-01-18 | Discharge: 2018-01-18 | Disposition: A | Discharge status: Home / Self Care | Payer: Medicare HMO | Source: Ambulatory Visit | Attending: Family Medicine | Admitting: Family Medicine

## 2018-01-18 ENCOUNTER — Encounter: Payer: Self-pay | Admitting: Family Medicine

## 2018-01-18 ENCOUNTER — Ambulatory Visit (INDEPENDENT_AMBULATORY_CARE_PROVIDER_SITE_OTHER): Payer: Medicare HMO | Admitting: Family Medicine

## 2018-01-18 ENCOUNTER — Ambulatory Visit: Payer: Self-pay

## 2018-01-18 VITALS — BP 140/90 | HR 92 | Temp 98.4°F | Ht 68.0 in | Wt 237.0 lb

## 2018-01-18 VITALS — BP 140/90 | HR 95 | Ht 68.0 in | Wt 237.0 lb

## 2018-01-18 DIAGNOSIS — M7711 Lateral epicondylitis, right elbow: Secondary | ICD-10-CM

## 2018-01-18 DIAGNOSIS — M25521 Pain in right elbow: Secondary | ICD-10-CM | POA: Diagnosis not present

## 2018-01-18 DIAGNOSIS — M5412 Radiculopathy, cervical region: Secondary | ICD-10-CM | POA: Insufficient documentation

## 2018-01-18 DIAGNOSIS — E782 Mixed hyperlipidemia: Secondary | ICD-10-CM | POA: Diagnosis not present

## 2018-01-18 DIAGNOSIS — I10 Essential (primary) hypertension: Secondary | ICD-10-CM

## 2018-01-18 DIAGNOSIS — Z1211 Encounter for screening for malignant neoplasm of colon: Secondary | ICD-10-CM

## 2018-01-18 DIAGNOSIS — M542 Cervicalgia: Secondary | ICD-10-CM

## 2018-01-18 MED ORDER — AMLODIPINE BESYLATE 10 MG PO TABS: 10.0000 mg | ORAL_TABLET | Freq: Every day | ORAL | 3 refills | Status: DC

## 2018-01-18 MED ORDER — GABAPENTIN 300 MG PO CAPS: ORAL_CAPSULE | ORAL | 3 refills | Status: DC

## 2018-01-18 NOTE — Assessment & Plan Note (Signed)
Cervical radiculopathy.  Discussed icing regimen and home exercises.  Discussed which activities to doing which wants to avoid.  Patient started on gabapentin 300 mg.  Warned of potential side effects.

## 2018-01-18 NOTE — Patient Instructions (Signed)
Good to see you  I really think all of it is coming from your neck  Gabapentin 300mg  at night should help the nerve painand make a big difference 1/2 the crestor daily to see if it is playing a role We will get you set up with an epidural for the neck.  I am hoping you will have improvement.  See em again in 3 weeks AFTER the epidural

## 2018-01-18 NOTE — Assessment & Plan Note (Signed)
Mild overall.  Patient states that he is doing exercises declined formal physical therapy.  Differential includes a cervical radiculopathy that I think is contributing.  Discussed that he does have some mild weakness as well.  Concerned with this.  We will try epidural in the neck.  Warned of potential side effects.  We will follow-up in 3 weeks afterwards

## 2018-01-18 NOTE — Progress Notes (Signed)
[name] is a 62 y.o. male with the following history as recorded in [employer]:  Patient Active Problem List   Diagnosis Date Noted  . Cervical radiculopathy at C8 01/18/2018  . Right lateral epicondylitis 12/21/2017  . Pain in joint, shoulder region 04/23/2015  . Periumbilical abdominal pain 02/13/2015  . Elevated PSA 08/04/2014  . Subacromial bursitis 04/11/2014  . Neck pain on right side 03/05/2014  . Bilateral shoulder pain 03/05/2014  . Recurrent boils 03/05/2014  . Foreign body in stomach 12/31/2013  . Reflux esophagitis 12/31/2013  . Weight loss 05/06/2012  . Incisional hernia 11/23/2011  . Cramp of limb 01/13/2011  . Hyperthyroidism 08/11/2010  . Sebaceous cyst 08/10/2010  . HYPERTROPHY PROSTATE W/UR OBST & OTH LUTS 02/27/2010  . SMOKER 08/05/2009  . 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 HEMORRHOIDS, INTERNAL, WITH BLEEDING 04/26/2008  . Hyperlipidemia 12/27/2007  . LOW BACK PAIN, CHRONIC 11/08/2007  . 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 ANXIETY DISORDER, GENERALIZED 01/25/2007  . DEPRESSION 01/25/2007  . Essential hypertension 01/25/2007  . ALLERGIC RHINITIS 01/25/2007  . SCOLIOSIS NEC 01/25/2007  . Impaired glucose tolerance 01/25/2007    Current Outpatient Medications  Medication Sig Dispense Refill  . acetaminophen (TYLENOL) 500 MG tablet Take 1,000 mg by mouth 2 (two) times daily.    [name] amLODipine (NORVASC) 10 MG tablet Take 1 tablet (10 mg total) by mouth daily. 30 tablet 3  . cyclobenzaprine (FLEXERIL) 10 MG tablet Take 1 tablet (10 mg total) by mouth at bedtime. 30 tablet 3  . fluPHENAZine (PROLIXIN) 10 MG tablet Take 2 tablets (20 mg total) by mouth daily. 60 tablet 0  . fluticasone (FLONASE) 50 MCG/ACT nasal spray Place 2 sprays into both nostrils daily. 16 g 6  . gabapentin (NEURONTIN) 300 MG capsule nightly 30 capsule 3  . KLOR-CON M20 20 MEQ tablet Take 1 tablet (20 mEq total) by mouth daily. 30 tablet 6  . losartan (COZAAR) 100 MG tablet  Take 1 tablet (100 mg total) by mouth daily. 30 tablet 6  . Magnesium 250 MG TABS Take by mouth.    . oxyCODONE-acetaminophen (PERCOCET) 10-325 MG tablet TAKE 1/2 TO 1 TABLET EVERY 8-12 HOURS AS NEEDED FOR PAIN  0  . pantoprazole (PROTONIX) 40 MG tablet Take 1 tablet (40 mg total) by mouth daily. 30 tablet 6  . rosuvastatin (CRESTOR) 10 MG tablet Take 1 tablet (10 mg total) by mouth daily. 30 tablet 6   No current facility-administered medications for this visit.     Allergies: Celecoxib; Iohexol; Metoclopramide hcl; and Wellbutrin [bupropion]  Past Medical History:  Diagnosis Date  . ALLERGIC RHINITIS 01/25/2007  . ANXIETY DISORDER, GENERALIZED 01/25/2007  . Arthritis   . DEPRESSION 01/25/2007  . ESOPHAGITIS 12/28/2007  . Fatty liver 10/09/2010  . GERD 01/25/2007  . GLUCOSE INTOLERANCE, HX OF 01/25/2007  . Heart murmur    hx of   . HEMORRHOIDS, INTERNAL, WITH BLEEDING 04/26/2008  . Hiatal hernia   . HYPERLIPIDEMIA 12/27/2007  . HYPERTENSION, ESSENTIAL NOS 01/25/2007  . Hyperthyroidism 08/11/2010  . HYPERTROPHY PROSTATE W/UR OBST & OTH LUTS 02/27/2010  . 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 LOW BACK PAIN, CHRONIC 11/08/2007  . Pancreatitis   . Rectal abscess   . 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 SCOLIOSIS NEC 01/25/2007  . Sebaceous cyst 08/10/2010  . SMOKER 08/05/2009  . UPPER GASTROINTESTINAL HEMORRHAGE 01/25/2007    Past Surgical History:  Procedure Laterality Date  . ABDOMINAL EXPLORATION SURGERY  1988  .  CHOLECYSTECTOMY  05/20/08   Dr. Zella Richer  . COLONOSCOPY  04/23/2010   normal  . ESOPHAGOGASTRODUODENOSCOPY N/A 12/31/2013   Procedure: ESOPHAGOGASTRODUODENOSCOPY (EGD);  Surgeon: Gatha Mayer, MD;  Location: [facility];  Service: Endoscopy;  Laterality: N/A;  . Excision of scalp lesion  07/2009  . Excision of thyroid mass    . GANGLION CYST EXCISION     right foot x 2  . HERNIA REPAIR     ventral  . INCISIONAL HERNIA REPAIR  12/01/2011   Procedure: LAPAROSCOPIC  INCISIONAL HERNIA;  Surgeon: Harl Bowie, MD;  Location: [facility];  Service: General;  Laterality: N/A;  . SHOULDER SURGERY     right    Family History  Problem Relation Age of Onset  . Hypertension Father   . Hypertension Mother   . Prostate cancer Other   . Colon cancer Neg Hx     Social History   Tobacco Use  . Smoking status: Current Every Day Smoker    Packs/day: 1.50    Years: 30.00    Pack years: 45.00    Types: Cigarettes  . Smokeless tobacco: Never Used  Substance Use Topics  . Alcohol use: No    Subjective:  Patient presents for blood pressure follow-up; was planning to update lipid panel- has not started Crestor as discussed at last office visit; Denies any chest pain, shortness of breath, blurred vision or headache.   Objective:  Vitals:   01/18/18 0845  BP: 140/90  Pulse: 92  Temp: 98.4 F (36.9 C)  TempSrc: Oral  SpO2: 92%  Weight: 237 lb (107.5 kg)  Height: 5\' 8"  (1.727 m)    General: Well developed, well nourished, in no acute distress  Skin : Warm and dry.  Head: Normocephalic and atraumatic  Eyes: Sclera and conjunctiva clear; pupils round and reactive to light; extraocular movements intact  Ears: External normal; canals clear; tympanic membranes normal  Oropharynx: Pink, supple. No suspicious lesions  Neck: Supple without thyromegaly, adenopathy  Lungs: Respirations unlabored; clear to auscultation bilaterally without wheeze, rales, rhonchi  CVS exam: normal rate and regular rhythm.  Neurologic: Alert and oriented; speech intact; face symmetrical; moves all extremities well; CNII-XII intact without focal deficit   Assessment:  1. Essential hypertension   2. Mixed hyperlipidemia   3. Encounter for screening colonoscopy     Plan:  1. Uncontrolled; increase Amlodipine to 10 mg; continue Losartan 100 mg daily; follow-up in 3 months, sooner prn. 2. Start Crestor as prescribed- will plan to update labs at next OV; 3. Refer to GI; ? Date of  last colonoscopy.   Stressed need to quit smoking- not a candidate for medications; okay to try OTC nicotine lozenges.   No follow-ups on file.  Orders Placed This Encounter  Procedures  . Ambulatory referral to Gastroenterology    Referral Priority:   Routine    Referral Type:   Consultation    Referral Reason:   Specialty Services Required    Number of Visits Requested:   1    Requested Prescriptions   Signed Prescriptions Disp Refills  . amLODipine (NORVASC) 10 MG tablet 30 tablet 3    Sig: Take 1 tablet (10 mg total) by mouth daily.

## 2018-01-23 ENCOUNTER — Ambulatory Visit: Payer: Medicare Other | Admitting: Family

## 2018-02-01 ENCOUNTER — Other Ambulatory Visit: Payer: Self-pay | Admitting: Family

## 2018-02-01 ENCOUNTER — Other Ambulatory Visit: Payer: Medicare HMO

## 2018-02-01 MED ORDER — LOSARTAN POTASSIUM 100 MG PO TABS: 100.0000 mg | ORAL_TABLET | Freq: Every day | ORAL | 1 refills | Status: DC

## 2018-02-07 ENCOUNTER — Ambulatory Visit
Admission: RE | Admit: 2018-02-07 | Discharge: 2018-02-07 | Disposition: A | Discharge status: Home / Self Care | Payer: Medicare HMO | Source: Ambulatory Visit | Attending: Family Medicine | Admitting: Family Medicine

## 2018-02-07 DIAGNOSIS — M542 Cervicalgia: Secondary | ICD-10-CM

## 2018-02-07 MED ORDER — IOPAMIDOL (ISOVUE-M 300) INJECTION 61%
1.0000 mL | Freq: Once | INTRAMUSCULAR | Status: AC
  Administered 2018-02-07: 1 mL via EPIDURAL

## 2018-02-07 MED ORDER — TRIAMCINOLONE ACETONIDE 40 MG/ML IJ SUSP (RADIOLOGY)
60.0000 mg | Freq: Once | INTRAMUSCULAR | Status: AC
  Administered 2018-02-07: 60 mg via EPIDURAL

## 2018-02-07 NOTE — Discharge Instructions (Signed)

## 2018-04-05 ENCOUNTER — Ambulatory Visit: Payer: Medicare HMO | Admitting: Family Medicine

## 2018-04-06 ENCOUNTER — Other Ambulatory Visit: Payer: Self-pay | Admitting: Family

## 2018-04-06 ENCOUNTER — Ambulatory Visit: Payer: Self-pay

## 2018-04-06 DIAGNOSIS — C61 Malignant neoplasm of prostate: Secondary | ICD-10-CM | POA: Diagnosis not present

## 2018-04-06 NOTE — Telephone Encounter (Signed)
Call returned to pt.  Reported he took Aleve 1 tab, and ASA 81 mg, 2 tabs about 12:30 PM, and headache is resolved.  Reported his BP of 148/94 @ 1:15 PM.  Stated "I feel a whole lot better."   Due to elevated BP, scheduled appt. 12/20 with PCP.  Care advice given per protocol.  Verb. Understanding. Agrees with plan.

## 2018-04-06 NOTE — Telephone Encounter (Signed)
Called pt. re: complaints.  Reported he went to his Oncologist this morning and was informed his BP was 140/91.    Reported he woke up with headache this morning; rated at 8-9/10.  Also, c/o his eyeballs hurt.  Stated he has this symptoms a lot.  Hasn't taken any pain medication for HA.  Stated he has headaches frequently.  Has no BP monitor at home to recheck BP at this time.  At present, denied blurred vision, chest pain, shortness of breath, weakness of extremities.  Recommended to take Acetaminophen for his headache.  Recommended he recheck his BP and nurse will call him back this afternoon.  Stated he will go to his mother's house and check his BP.  Pt. Requested to call him back after 2:00 PM.           Reason for Disposition . [6] Systolic BP  >= 237 OR Diastolic >= 80 AND [6] taking BP medications    Reported elevated BP this morning while at Oncologist office; 140/81; does not have BP monitor at home to recheck at present time.  Answer Assessment - Initial Assessment Questions 1. LOCATION: "Where does it hurt?"      Front of head and right temple  2. ONSET: "When did the headache start?" (Minutes, hours or days)      Has had headache for years; woke up this morning at 7:30 AM with "splitting headache"  3. PATTERN: "Does the pain come and go, or has it been constant since it started?"     Continuous 4. SEVERITY: "How bad is the pain?" and "What does it keep you from doing?"  (e.g., Scale 1-10; mild, moderate, or severe)   - MILD (1-3): doesn't interfere with normal activities    - MODERATE (4-7): interferes with normal activities or awakens from sleep    - SEVERE (8-10): excruciating pain, unable to do any normal activities        8-9/10 5. RECURRENT SYMPTOM: "Have you ever had headaches before?" If so, ask: "When was the last time?" and "What happened that time?"      Yes, has had headaches for years 6. CAUSE: "What do you think is causing the headache?"    Has ongoing problem with  headaches; thinks the Schizophrenic medication and BP medication causes the headache 7. MIGRAINE: "Have you been diagnosed with migraine headaches?" If so, ask: "Is this headache similar?"      Years ago, diagnosed with headache 8. HEAD INJURY: "Has there been any recent injury to the head?"      denied 9. OTHER SYMPTOMS: "Do you have any other symptoms?" (fever, stiff neck, eye pain, sore throat, cold symptoms)     C/o eye balls hurting that has been present; denied dizziness; elevated BP this morning;  Denied weakness of extremities  Answer Assessment - Initial Assessment Questions 1. BLOOD PRESSURE: "What is the blood pressure?" "Did you take at least two measurements 5 minutes apart?"     140/91 at Oncologist's office today at approx. 9:30 AM   2. ONSET: "When did you take your blood pressure?"    See above  3. HOW: "How did you obtain the blood pressure?" (e.g., visiting nurse, automatic home BP monitor)     Taken at another MD office  4. HISTORY: "Do you have a history of high blood pressure?"     Yes 5. MEDICATIONS: "Are you taking any medications for blood pressure?" "Have you missed any doses recently?"     Yes;  took morning doses today.  6. OTHER SYMPTOMS: "Do you have any symptoms?" (e.g., headache, chest pain, blurred vision, difficulty breathing, weakness)    C/o headache; Stated had chest pain yesterday and the day before, in left shoulder and left neck; denied shortness of breath, denied weakness or blurred vision  Protocols used: HIGH BLOOD PRESSURE-A-AH, HEADACHE-A-AH  Message from Rutherford Nail, NT sent at 04/06/2018 11:27 AM EST   Summary: 140/91 with headache this morning @ 8am   Patient calling and states that this morning at 8:00am he woke up with a horrible headache and his blood pressure was 140/91. States that he took his blood pressure medication, but would like to speak with a nurse.

## 2018-04-07 ENCOUNTER — Ambulatory Visit (INDEPENDENT_AMBULATORY_CARE_PROVIDER_SITE_OTHER): Payer: Medicare HMO | Admitting: Family

## 2018-04-07 ENCOUNTER — Encounter: Payer: Self-pay | Admitting: Family

## 2018-04-07 ENCOUNTER — Ambulatory Visit (INDEPENDENT_AMBULATORY_CARE_PROVIDER_SITE_OTHER)
Admission: RE | Admit: 2018-04-07 | Discharge: 2018-04-07 | Disposition: A | Discharge status: Home / Self Care | Payer: Medicare HMO | Source: Ambulatory Visit | Attending: Family | Admitting: Family

## 2018-04-07 VITALS — BP 144/90 | HR 102 | Temp 97.9°F | Ht 68.0 in | Wt 230.0 lb

## 2018-04-07 DIAGNOSIS — R109 Unspecified abdominal pain: Secondary | ICD-10-CM

## 2018-04-07 DIAGNOSIS — I10 Essential (primary) hypertension: Secondary | ICD-10-CM

## 2018-04-07 DIAGNOSIS — Z720 Tobacco use: Secondary | ICD-10-CM | POA: Diagnosis not present

## 2018-04-07 NOTE — Progress Notes (Signed)
[name] is a 62 y.o. male with the following history as recorded in [employer]:  Patient Active Problem List   Diagnosis Date Noted  . Cervical radiculopathy at C8 01/18/2018  . Right lateral epicondylitis 12/21/2017  . Pain in joint, shoulder region 04/23/2015  . Periumbilical abdominal pain 02/13/2015  . Elevated PSA 08/04/2014  . Subacromial bursitis 04/11/2014  . Neck pain on right side 03/05/2014  . Bilateral shoulder pain 03/05/2014  . Recurrent boils 03/05/2014  . Foreign body in stomach 12/31/2013  . Reflux esophagitis 12/31/2013  . Weight loss 05/06/2012  . Incisional hernia 11/23/2011  . Cramp of limb 01/13/2011  . Hyperthyroidism 08/11/2010  . Sebaceous cyst 08/10/2010  . HYPERTROPHY PROSTATE W/UR OBST & OTH LUTS 02/27/2010  . SMOKER 08/05/2009  . 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 HEMORRHOIDS, INTERNAL, WITH BLEEDING 04/26/2008  . Hyperlipidemia 12/27/2007  . LOW BACK PAIN, CHRONIC 11/08/2007  . 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 ANXIETY DISORDER, GENERALIZED 01/25/2007  . DEPRESSION 01/25/2007  . Essential hypertension 01/25/2007  . ALLERGIC RHINITIS 01/25/2007  . SCOLIOSIS NEC 01/25/2007  . Impaired glucose tolerance 01/25/2007    Current Outpatient Medications  Medication Sig Dispense Refill  . acetaminophen (TYLENOL) 500 MG tablet Take 1,000 mg by mouth 2 (two) times daily.    [name] amLODipine (NORVASC) 10 MG tablet Take 1 tablet (10 mg total) by mouth daily. 30 tablet 3  . cyclobenzaprine (FLEXERIL) 10 MG tablet TAKE 1 TABLET BY MOUTH EVERYDAY AT BEDTIME 30 tablet 0  . fluPHENAZine (PROLIXIN) 10 MG tablet Take 2 tablets (20 mg total) by mouth daily. 60 tablet 0  . fluticasone (FLONASE) 50 MCG/ACT nasal spray Place 2 sprays into both nostrils daily. 16 g 6  . gabapentin (NEURONTIN) 300 MG capsule nightly 30 capsule 3  . KLOR-CON M20 20 MEQ tablet Take 1 tablet (20 mEq total) by mouth daily. 30 tablet 6  . losartan (COZAAR) 100 MG tablet Take 1  tablet (100 mg total) by mouth daily. 90 tablet 1  . Magnesium 250 MG TABS Take by mouth.    . oxyCODONE-acetaminophen (PERCOCET) 10-325 MG tablet TAKE 1/2 TO 1 TABLET EVERY 8-12 HOURS AS NEEDED FOR PAIN  0  . pantoprazole (PROTONIX) 40 MG tablet Take 1 tablet (40 mg total) by mouth daily. 30 tablet 6  . rosuvastatin (CRESTOR) 10 MG tablet Take 1 tablet (10 mg total) by mouth daily. 30 tablet 6   No current facility-administered medications for this visit.     Allergies: Celecoxib; Iohexol; Metoclopramide hcl; and Wellbutrin [bupropion]  Past Medical History:  Diagnosis Date  . ALLERGIC RHINITIS 01/25/2007  . ANXIETY DISORDER, GENERALIZED 01/25/2007  . Arthritis   . DEPRESSION 01/25/2007  . ESOPHAGITIS 12/28/2007  . Fatty liver 10/09/2010  . GERD 01/25/2007  . GLUCOSE INTOLERANCE, HX OF 01/25/2007  . Heart murmur    hx of   . HEMORRHOIDS, INTERNAL, WITH BLEEDING 04/26/2008  . Hiatal hernia   . HYPERLIPIDEMIA 12/27/2007  . HYPERTENSION, ESSENTIAL NOS 01/25/2007  . Hyperthyroidism 08/11/2010  . HYPERTROPHY PROSTATE W/UR OBST & OTH LUTS 02/27/2010  . 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 LOW BACK PAIN, CHRONIC 11/08/2007  . Pancreatitis   . Rectal abscess   . 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 SCOLIOSIS NEC 01/25/2007  . Sebaceous cyst 08/10/2010  . SMOKER 08/05/2009  . UPPER GASTROINTESTINAL HEMORRHAGE 01/25/2007    Past Surgical History:  Procedure Laterality Date  . ABDOMINAL EXPLORATION SURGERY  1988  . CHOLECYSTECTOMY  05/20/08   Dr. Zella Richer  . COLONOSCOPY  04/23/2010   normal  . ESOPHAGOGASTRODUODENOSCOPY N/A 12/31/2013   Procedure: ESOPHAGOGASTRODUODENOSCOPY (EGD);  Surgeon: Gatha Mayer, MD;  Location: [facility];  Service: Endoscopy;  Laterality: N/A;  . Excision of scalp lesion  07/2009  . Excision of thyroid mass    . GANGLION CYST EXCISION     right foot x 2  . HERNIA REPAIR     ventral  . INCISIONAL HERNIA REPAIR  12/01/2011   Procedure: LAPAROSCOPIC INCISIONAL  HERNIA;  Surgeon: Harl Bowie, MD;  Location: [place];  Service: General;  Laterality: N/A;  . SHOULDER SURGERY     right    Family History  Problem Relation Age of Onset  . Hypertension Father   . Hypertension Mother   . Prostate cancer Other   . Colon cancer Neg Hx     Social History   Tobacco Use  . Smoking status: Current Every Day Smoker    Packs/day: 1.50    Years: 30.00    Pack years: 45.00    Types: Cigarettes  . Smokeless tobacco: Never Used  Substance Use Topics  . Alcohol use: No    Subjective:  Follow-up on hypertension; currently on Losartan 100 mg/ Amlodipine 10 mg daily; was worried that his pressure was up yesterday; increased personal stress as his wife is not working- worried about how to pay bills; Known history of abdominal hernia- has had surgery at least 3 x; surgeon will not do surgery until patient quits smoking; patient is adamant that he "can't quit smoking/ does not want to quit smoking." Is concerned that he has not been able to have a bowel movement in 4 days; no prior history of bowel obstruction.     Objective:  Vitals:   04/07/18 1014  BP: (!) 144/90  Pulse: (!) 102  Temp: 97.9 F (36.6 C)  TempSrc: Oral  SpO2: 99%  Weight: 230 lb 0.6 oz (104.3 kg)  Height: 5\' 8"  (1.727 m)    General: Well developed, well nourished, in no acute distress  Skin : Warm and dry.  Head: Normocephalic and atraumatic  Eyes: Sclera and conjunctiva clear; pupils round and reactive to light; extraocular movements intact  Ears: External normal; canals clear; tympanic membranes normal  Oropharynx: Pink, supple. No suspicious lesions  Neck: Supple without thyromegaly, adenopathy  Lungs: Respirations unlabored; clear to auscultation bilaterally without wheeze, rales, rhonchi  CVS exam: normal rate and regular rhythm.  Abdomen: Soft; nontender; nondistended; normoactive bowel sounds; no masses or hepatosplenomegaly  Neurologic: Alert and oriented; speech  intact; face symmetrical; moves all extremities well; CNII-XII intact without focal deficit   Assessment:  1. Abdominal pain, unspecified abdominal location   2. Essential hypertension   3. Tobacco abuse     Plan:  1. Difficult historian- it was difficult to determine exactly what he was worried about today; repeatedly talked about CT from 2015 which raised question of retained foreign body; follow-up testing at the time revealed this was not true; discussed those findings with him today and he said he was sure there was a foreign object in him; offered to update x-ray to rule out obstruction; offered to have him go back to his GI but he defers; 2. ? Control; patient does not want to change medication- feels stress from finances caused his pressure to be up; 3. Stressed need to quit smoking- patient is not interested.   No follow-ups on file.  Orders Placed This Encounter  Procedures  . DG Abd 2 Views    Standing Status:   Future    Number of Occurrences:   1    Standing Expiration Date:   06/09/2019    Order Specific Question:   Reason for Exam (SYMPTOM  OR DIAGNOSIS REQUIRED)    Answer:   abdominal pain    Order Specific Question:   Preferred imaging location?    Answer:   [facility]    Order Specific Question:   Radiology Contrast Protocol - do NOT remove file path    Answer:   [email or web address]    Requested Prescriptions    No prescriptions requested or ordered in this encounter

## 2018-04-12 ENCOUNTER — Emergency Department (HOSPITAL_COMMUNITY): Payer: Medicare HMO

## 2018-04-12 ENCOUNTER — Other Ambulatory Visit: Payer: Self-pay

## 2018-04-12 ENCOUNTER — Emergency Department (HOSPITAL_COMMUNITY)
Admission: EM | Admit: 2018-04-12 | Discharge: 2018-04-12 | Disposition: A | Discharge status: Home / Self Care | Payer: Medicare HMO | Attending: Emergency Medicine | Admitting: Emergency Medicine

## 2018-04-12 DIAGNOSIS — I10 Essential (primary) hypertension: Secondary | ICD-10-CM | POA: Diagnosis not present

## 2018-04-12 DIAGNOSIS — R531 Weakness: Secondary | ICD-10-CM | POA: Insufficient documentation

## 2018-04-12 DIAGNOSIS — R0789 Other chest pain: Secondary | ICD-10-CM | POA: Diagnosis present

## 2018-04-12 DIAGNOSIS — K439 Ventral hernia without obstruction or gangrene: Secondary | ICD-10-CM | POA: Diagnosis not present

## 2018-04-12 DIAGNOSIS — Z79899 Other long term (current) drug therapy: Secondary | ICD-10-CM | POA: Insufficient documentation

## 2018-04-12 DIAGNOSIS — F17210 Nicotine dependence, cigarettes, uncomplicated: Secondary | ICD-10-CM | POA: Diagnosis not present

## 2018-04-12 DIAGNOSIS — R300 Dysuria: Secondary | ICD-10-CM | POA: Diagnosis not present

## 2018-04-12 LAB — BASIC METABOLIC PANEL
Anion gap: 10 (ref 5–15)
BUN: 9 mg/dL (ref 8–23)
CALCIUM: 9.1 mg/dL (ref 8.9–10.3)
CO2: 27 mmol/L (ref 22–32)
CREATININE: 1.32 mg/dL — AB (ref 0.61–1.24)
Chloride: 102 mmol/L (ref 98–111)
GFR calc non Af Amer: 58 mL/min — ABNORMAL LOW (ref >60)
GLUCOSE: 112 mg/dL — AB (ref 70–99)
Potassium: 3 mmol/L — ABNORMAL LOW (ref 3.5–5.1)
Sodium: 139 mmol/L (ref 135–145)

## 2018-04-12 LAB — CBC
HCT: 42.5 % (ref 39.0–52.0)
Hemoglobin: 13.9 g/dL (ref 13.0–17.0)
MCH: 29.4 pg (ref 26.0–34.0)
MCHC: 32.7 g/dL (ref 30.0–36.0)
MCV: 89.9 fL (ref 80.0–100.0)
NRBC: 0 % (ref 0.0–0.2)
PLATELETS: 313 10*3/uL (ref 150–400)
RBC: 4.73 MIL/uL (ref 4.22–5.81)
RDW: 13.8 % (ref 11.5–15.5)
WBC: 11 10*3/uL — ABNORMAL HIGH (ref 4.0–10.5)

## 2018-04-12 LAB — URINALYSIS, ROUTINE W REFLEX MICROSCOPIC
BILIRUBIN URINE: NEGATIVE
Glucose, UA: NEGATIVE mg/dL
HGB URINE DIPSTICK: NEGATIVE
Ketones, ur: NEGATIVE mg/dL
Leukocytes, UA: NEGATIVE
Nitrite: NEGATIVE
PH: 6 (ref 5.0–8.0)
Protein, ur: NEGATIVE mg/dL
SPECIFIC GRAVITY, URINE: 1.002 — AB (ref 1.005–1.030)

## 2018-04-12 LAB — I-STAT TROPONIN, ED: Troponin i, poc: 0 ng/mL (ref 0.00–0.08)

## 2018-04-12 MED ORDER — ALUM & MAG HYDROXIDE-SIMETH 200-200-20 MG/5ML PO SUSP
15.0000 mL | Freq: Once | ORAL | Status: AC
  Administered 2018-04-12: 15 mL via ORAL
  Filled 2018-04-12: qty 30

## 2018-04-12 MED ORDER — POTASSIUM CHLORIDE CRYS ER 20 MEQ PO TBCR
40.0000 meq | EXTENDED_RELEASE_TABLET | Freq: Once | ORAL | Status: AC
  Administered 2018-04-12: 40 meq via ORAL
  Filled 2018-04-12 (×2): qty 2

## 2018-04-12 NOTE — ED Provider Notes (Signed)
[facility] DEPT Provider Note   CSN: [id number] Arrival date & time: 04/12/18  1901     History   Chief Complaint Chief Complaint  Patient presents with  . Chest Pain    HPI [name] is a 62 y.o. male with a PMH of HTN, HLD, Schizophrenia, GERD, and hiatal hernia presents with middle non radiating chest pain onset 6pm. Patient reports pain started while sitting after eating dinner. Patient describes pain as a burning and states pain is the same as his pain with GERD. Patient states he forgot to take his GERD medications today. Patient denies nothing makes symptoms better or worse. Patient states he took aspirin at 4pm for a headache that resolved, but denies taking any other medications. Patient states he has had multiple hernia repairs in the past, but surgeon will not operate due to his tobacco use. Patient reports an episode of vomiting a few weeks ago, but denies any vomiting or nausea today. Patient reports burning with urination a few days ago, but states symptoms have improved. Patient reports chronic diffuse weakness for years. Patient states this is not a new symptom. Denies DOE, SOB, chest tightness or pressure, radiation to left arm, jaw or back, or diaphoresis. Denies recent travel, leg edema/pain, or recent surgeries.   HPI  Past Medical History:  Diagnosis Date  . ALLERGIC RHINITIS 01/25/2007  . ANXIETY DISORDER, GENERALIZED 01/25/2007  . Arthritis   . DEPRESSION 01/25/2007  . ESOPHAGITIS 12/28/2007  . Fatty liver 10/09/2010  . GERD 01/25/2007  . GLUCOSE INTOLERANCE, HX OF 01/25/2007  . Heart murmur    hx of   . HEMORRHOIDS, INTERNAL, WITH BLEEDING 04/26/2008  . Hiatal hernia   . HYPERLIPIDEMIA 12/27/2007  . HYPERTENSION, ESSENTIAL NOS 01/25/2007  . Hyperthyroidism 08/11/2010  . HYPERTROPHY PROSTATE W/UR OBST & OTH LUTS 02/27/2010  . 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 LOW BACK PAIN, CHRONIC 11/08/2007  . Pancreatitis   . Rectal  abscess   . 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 SCOLIOSIS NEC 01/25/2007  . Sebaceous cyst 08/10/2010  . SMOKER 08/05/2009  . UPPER GASTROINTESTINAL HEMORRHAGE 01/25/2007    Patient Active Problem List   Diagnosis Date Noted  . Cervical radiculopathy at C8 01/18/2018  . Right lateral epicondylitis 12/21/2017  . Pain in joint, shoulder region 04/23/2015  . Periumbilical abdominal pain 02/13/2015  . Elevated PSA 08/04/2014  . Subacromial bursitis 04/11/2014  . Neck pain on right side 03/05/2014  . Bilateral shoulder pain 03/05/2014  . Recurrent boils 03/05/2014  . Foreign body in stomach 12/31/2013  . Reflux esophagitis 12/31/2013  . Weight loss 05/06/2012  . Incisional hernia 11/23/2011  . Cramp of limb 01/13/2011  . Hyperthyroidism 08/11/2010  . Sebaceous cyst 08/10/2010  . HYPERTROPHY PROSTATE W/UR OBST & OTH LUTS 02/27/2010  . SMOKER 08/05/2009  . 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 HEMORRHOIDS, INTERNAL, WITH BLEEDING 04/26/2008  . Hyperlipidemia 12/27/2007  . LOW BACK PAIN, CHRONIC 11/08/2007  . 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 ANXIETY DISORDER, GENERALIZED 01/25/2007  . DEPRESSION 01/25/2007  . Essential hypertension 01/25/2007  . ALLERGIC RHINITIS 01/25/2007  . SCOLIOSIS NEC 01/25/2007  . Impaired glucose tolerance 01/25/2007    Past Surgical History:  Procedure Laterality Date  . ABDOMINAL EXPLORATION SURGERY  1988  . CHOLECYSTECTOMY  05/20/08   Dr. Zella Richer  . COLONOSCOPY  04/23/2010   normal  . ESOPHAGOGASTRODUODENOSCOPY N/A 12/31/2013   Procedure: ESOPHAGOGASTRODUODENOSCOPY (EGD);  Surgeon: Gatha Mayer, MD;  Location: [facility];  Service: Endoscopy;  Laterality: N/A;  . Excision of scalp lesion  07/2009  . Excision of thyroid mass    . GANGLION CYST EXCISION     right foot x 2  . HERNIA REPAIR     ventral  . INCISIONAL HERNIA REPAIR  12/01/2011   Procedure: LAPAROSCOPIC INCISIONAL HERNIA;  Surgeon: Harl Bowie, MD;  Location: [facility];  Service: General;  Laterality: N/A;  . SHOULDER SURGERY     right        Home Medications    Prior to Admission medications   Medication Sig Start Date End Date Taking? Authorizing Provider  acetaminophen (TYLENOL) 500 MG tablet Take 1,000 mg by mouth every 6 (six) hours as needed for moderate pain.    Yes [provider]  amLODipine (NORVASC) 10 MG tablet Take 1 tablet (10 mg total) by mouth daily. 01/18/18  Yes Marrian Salvage, FNP  cyclobenzaprine (FLEXERIL) 10 MG tablet TAKE 1 TABLET BY MOUTH EVERYDAY AT BEDTIME Patient taking differently: Take 10 mg by mouth at bedtime.  04/07/18  Yes Marrian Salvage, FNP  fluPHENAZine (PROLIXIN) 10 MG tablet Take 2 tablets (20 mg total) by mouth daily. 10/19/17  Yes Marrian Salvage, FNP  gabapentin (NEURONTIN) 300 MG capsule nightly Patient taking differently: Take 300 mg by mouth at bedtime. nightly 01/18/18  Yes Hulan Saas M, DO  KLOR-CON M20 20 MEQ tablet Take 1 tablet (20 mEq total) by mouth daily. 10/19/17  Yes Marrian Salvage, FNP  losartan (COZAAR) 100 MG tablet Take 1 tablet (100 mg total) by mouth daily. 02/01/18  Yes Marrian Salvage, FNP  naproxen sodium (ALEVE) 220 MG tablet Take 440 mg by mouth daily as needed (pain).   Yes [provider]  pantoprazole (PROTONIX) 40 MG tablet Take 1 tablet (40 mg total) by mouth daily. 10/19/17  Yes Marrian Salvage, FNP  fluticasone [facility]) 50 MCG/ACT nasal spray Place 2 sprays into both nostrils daily. Patient taking differently: Place 2 sprays into both nostrils daily as needed for allergies.  12/23/17   Marrian Salvage, FNP  rosuvastatin (CRESTOR) 10 MG tablet Take 1 tablet (10 mg total) by mouth daily. 11/21/17   Marrian Salvage, FNP    Family History Family History  Problem Relation Age of Onset  . Hypertension Father   . Hypertension Mother   . Prostate cancer Other   . Colon cancer Neg Hx     Social History Social  History   Tobacco Use  . Smoking status: Current Every Day Smoker    Packs/day: 1.50    Years: 30.00    Pack years: 45.00    Types: Cigarettes  . Smokeless tobacco: Never Used  Substance Use Topics  . Alcohol use: No  . Drug use: Yes    Frequency: 14.0 times per week    Types: Marijuana     Allergies   Celecoxib; Iohexol; Metoclopramide hcl; and Wellbutrin [bupropion]   Review of Systems Review of Systems  Constitutional: Negative for chills, diaphoresis and fever.  Respiratory: Negative for cough and shortness of breath.   Cardiovascular: Positive for chest pain. Negative for palpitations and leg swelling.  Gastrointestinal: Negative for abdominal pain, diarrhea, nausea and vomiting.  Endocrine: Negative for cold intolerance and heat intolerance.  Genitourinary: Positive for dysuria. Negative for frequency.  Musculoskeletal: Negative for back pain.  Skin: Negative for rash.  Allergic/Immunologic: Negative for immunocompromised state.  Neurological: Positive for weakness.  Hematological: Negative for  adenopathy.     Physical Exam Updated Vital Signs BP (!) 160/92 (BP Location: Right Arm)   Pulse 80   Temp 98.3 F (36.8 C) (Oral)   Resp 18   SpO2 95%   Physical Exam Vitals signs and nursing note reviewed.  Constitutional:      General: He is not in acute distress.    Appearance: He is well-developed. He is not diaphoretic.  HENT:     Head: Normocephalic and atraumatic.  Neck:     Musculoskeletal: Normal range of motion and neck supple.     Vascular: No JVD.  Cardiovascular:     Rate and Rhythm: Normal rate and regular rhythm.     Pulses: Normal pulses.          Radial pulses are 2+ on the right side and 2+ on the left side.       Dorsalis pedis pulses are 2+ on the right side and 2+ on the left side.     Heart sounds: Normal heart sounds. No murmur. No friction rub. No gallop.   Pulmonary:     Effort: Pulmonary effort is normal. No respiratory distress.       Breath sounds: Normal breath sounds. No wheezing or rales.  Chest:     Chest wall: No tenderness.  Abdominal:     Palpations: Abdomen is soft.     Tenderness: There is no abdominal tenderness.     Hernia: A hernia (Hernia is non tender to palpation.) is present. Hernia is present in the ventral area.  Musculoskeletal: Normal range of motion.     Right lower leg: He exhibits no tenderness. No edema.     Left lower leg: He exhibits no tenderness. No edema.  Skin:    General: Skin is warm.     Capillary Refill: Capillary refill takes less than 2 seconds.     Coloration: Skin is not pale.     Findings: No rash.  Neurological:     General: No focal deficit present.     Mental Status: He is alert and oriented to person, place, and time.     Motor: No weakness.      ED Treatments / Results  Labs (all labs ordered are listed, but only abnormal results are displayed) Labs Reviewed  BASIC METABOLIC PANEL - Abnormal; Notable for the following components:      Result Value   Potassium 3.0 (*)    Glucose, Bld 112 (*)    Creatinine, Ser 1.32 (*)    GFR calc non Af Amer 58 (*)    All other components within normal limits  CBC - Abnormal; Notable for the following components:   WBC 11.0 (*)    All other components within normal limits  URINALYSIS, ROUTINE W REFLEX MICROSCOPIC - Abnormal; Notable for the following components:   Color, Urine COLORLESS (*)    Specific Gravity, Urine 1.002 (*)    All other components within normal limits  URINE CULTURE  I-STAT TROPONIN, ED   BUN  Date Value Ref Range Status  04/12/2018 9 8 - 23 mg/dL Final  10/19/2017 9 6 - 23 mg/dL Final  03/24/2017 <5 (L) 6 - 20 mg/dL Final  09/12/2016 8 6 - 20 mg/dL Final   Creatinine, Ser  Date Value Ref Range Status  04/12/2018 1.32 (H) 0.61 - 1.24 mg/dL Final  10/19/2017 1.16 0.40 - 1.50 mg/dL Final  03/24/2017 1.38 (H) 0.61 - 1.24 mg/dL Final  09/12/2016 1.60 (H) 0.61 -  1.24 mg/dL Final    EKG EKG  Interpretation  Date/Time:  Wednesday April 12 2018 19:31:58 EST Ventricular Rate:  92 PR Interval:    QRS Duration: 80 QT Interval:  358 QTC Calculation: 443 R Axis:   22 Text Interpretation:  Sinus rhythm Baseline wander Otherwise within normal limits Confirmed by Carmin Muskrat [phone]) on 04/12/2018 9:09:09 PM   Radiology Dg Chest 2 View  Result Date: 04/12/2018 CLINICAL DATA:  Chest pain EXAM: CHEST - 2 VIEW COMPARISON:  03/24/2017 FINDINGS: The heart size and mediastinal contours are within normal limits. Both lungs are clear. The visualized skeletal structures are unremarkable. IMPRESSION: No active cardiopulmonary disease. Electronically Signed   By: Inez Catalina M.D.   On: 04/12/2018 20:10    Procedures Procedures (including critical care time)  Medications Ordered in ED Medications  alum & mag hydroxide-simeth (MAALOX/MYLANTA) 200-200-20 MG/5ML suspension 15 mL (15 mLs Oral Given 04/12/18 2106)  potassium chloride SA (K-DUR,KLOR-CON) CR tablet 40 mEq (40 mEq Oral Given 04/12/18 2235)     Initial Impression / Assessment and Plan / ED Course  I have reviewed the triage vital signs and the nursing notes.  Pertinent labs & imaging results that were available during my care of the patient were reviewed by me and considered in my medical decision making (see chart for details).  Clinical Course as of Apr 12 2245  Wed Apr 12, 2018  2013 No active cardiopulmonary disease noted on CXR.  DG Chest 2 View [AH]  2212 Pt reports symptoms have improved with Maalox/Mylanta while in the ER.   [AH]  2223 Hypokalemia noted at 3. Provide PO potassium in the ER.  Potassium(!): 3.0 [AH]    Clinical Course User Index [AH] Arville Lime, PA-C   Patient is to be discharged with recommendation to follow up with PCP in regards to today's hospital visit. Chest pain is not likely of cardiac or pulmonary etiology d/t presentation, VSS, no tracheal deviation, no JVD or new murmur, RRR,  breath sounds equal bilaterally, EKG without acute abnormalities, negative troponin, and negative CXR. Symptoms have improved while in the ER with Maalox/Mylanta. Provided PO potassium due to hypokalemia. Pt has been advised to return to the ED if CP becomes exertional, associated with diaphoresis or nausea, radiates to left jaw/arm, worsens or becomes concerning in any way. Pt appears reliable for follow up and is agreeable to discharge.   Final Clinical Impressions(s) / ED Diagnoses   Final diagnoses:  Atypical chest pain    ED Discharge Orders    None       Julienne Kass 04/12/18 2247    Carmin Muskrat, MD 04/12/18 2257

## 2018-04-12 NOTE — Discharge Instructions (Addendum)
You have been seen today for chest pain. Please read and follow all provided instructions.   1. Medications: usual home medications 2. Treatment: rest, drink plenty of fluids, return to the ED if chest pain becomes exertional, associated with diaphoresis or nausea, radiates to left jaw/arm, worsens or becomes concerning in any way. 3. Follow Up: Please follow up with your primary doctor in 2 days for discussion of your diagnoses and further evaluation after today's visit; if you do not have a primary care doctor use the resource guide provided to find one; Please return to the ER for any new or worsening symptoms. Please obtain all of your results from medical records or have your doctors office obtain the results - share them with your doctor - you should be seen at your doctors office. Call today to arrange your follow up.   Take medications as prescribed. Please review all of the medicines and only take them if you do not have an allergy to them. Return to the emergency room for worsening condition or new concerning symptoms. Follow up with your regular doctor. If you don't have a regular doctor use one of the numbers below to establish a primary care doctor.  Please be aware that if you are taking birth control pills, taking other prescriptions, ESPECIALLY ANTIBIOTICS may make the birth control ineffective - if this is the case, either do not engage in sexual activity or use alternative methods of birth control such as condoms until you have finished the medicine and your family doctor says it is OK to restart them. If you are on a blood thinner such as COUMADIN, be aware that any other medicine that you take may cause the coumadin to either work too much, or not enough - you should have your coumadin level rechecked in next 7 days if this is the case.  ?  It is also a possibility that you have an allergic reaction to any of the medicines that you have been prescribed - Everybody reacts differently to  medications and while MOST people have no trouble with most medicines, you may have a reaction such as nausea, vomiting, rash, swelling, shortness of breath. If this is the case, please stop taking the medicine immediately and contact your physician.  ?  You should return to the ER if you develop severe or worsening symptoms.   Emergency Department Resource Guide 1) Find a Doctor and Pay Out of Pocket Although you won't have to find out who is covered by your insurance plan, it is a good idea to ask around and get recommendations. You will then need to call the office and see if the doctor you have chosen will accept you as a new patient and what types of options they offer for patients who are self-pay. Some doctors offer discounts or will set up payment plans for their patients who do not have insurance, but you will need to ask so you aren't surprised when you get to your appointment.  2) Contact Your Local Health Department Not all health departments have doctors that can see patients for sick visits, but many do, so it is worth a call to see if yours does. If you don't know where your local health department is, you can check in your phone book. The CDC also has a tool to help you locate your state's health department, and many state websites also have listings of all of their local health departments.  3) Find a [employer] If your  illness is not likely to be very severe or complicated, you may want to try a walk in clinic. These are popping up all over the country in pharmacies, drugstores, and shopping centers. They're usually staffed by nurse practitioners or physician assistants that have been trained to treat common illnesses and complaints. They're usually fairly quick and inexpensive. However, if you have serious medical issues or chronic medical problems, these are probably not your best option.  No Primary Care Doctor: Call Health Connect at  [phone] - they can help you locate a  primary care doctor that  accepts your insurance, provides certain services, etc. Physician Referral [phone]  Emergency Department Resource Guide 1) Find a Doctor and Pay Out of Pocket Although you won't have to find out who is covered by your insurance plan, it is a good idea to ask around and get recommendations. You will then need to call the office and see if the doctor you have chosen will accept you as a new patient and what types of options they offer for patients who are self-pay. Some doctors offer discounts or will set up payment plans for their patients who do not have insurance, but you will need to ask so you aren't surprised when you get to your appointment.  2) Contact Your Local Health Department Not all health departments have doctors that can see patients for sick visits, but many do, so it is worth a call to see if yours does. If you don't know where your local health department is, you can check in your phone book. The CDC also has a tool to help you locate your state's health department, and many state websites also have listings of all of their local health departments.  3) Find a [facility] If your illness is not likely to be very severe or complicated, you may want to try a walk in clinic. These are popping up all over the country in pharmacies, drugstores, and shopping centers. They're usually staffed by nurse practitioners or physician assistants that have been trained to treat common illnesses and complaints. They're usually fairly quick and inexpensive. However, if you have serious medical issues or chronic medical problems, these are probably not your best option.  No Primary Care Doctor: Call Health Connect at  [phone] - they can help you locate a primary care doctor that  accepts your insurance, provides certain services, etc. Physician Referral Service- [phone]  Chronic Pain Problems: Organization         Address  Phone   Notes  [facility]  [phone] Patients need to be referred by their primary care doctor.   Medication Assistance: Organization         Address  Phone   Notes  [facility] [address] [place], [place] [address] [phone] --Must be a resident of [facility] -- Must have NO insurance coverage whatsoever (no Medicaid/ Medicare, etc.) -- The pt. MUST have a primary care doctor that directs their care regularly and follows them in the community   [facility]  [phone]   [employer]  [phone]    Agencies that provide inexpensive medical care: Organization         Address  Phone   Notes  [place]  [phone]   [facility]    [phone]   [facility] [place], [place] [address] [phone]  [facility] [address], [place] [phone]   [facility]    [phone]   [facility]    [phone]   [address], [place] Phone:  [phone], Fax:  [phone] Hours of Operation:  9 am - 6 pm, M-F.  Also accepts Medicaid/Medicare and self-pay.  [facility] for [address], [address], [place] Phone: [phone], Fax: [phone]. Hours of Operation:  8:30 am - 5:30 pm, M-F.  Also accepts Medicaid and self-pay.  [facility] [address] [place] Phone: [phone]   [place], [place], [place] [phone], Ext. [phone] Mondays & Thursdays: 7-9 AM.  First 15 patients are seen on a first come, first serve basis.    [facility]:  Organization         Address  Phone   Notes  [facility] [address], [place] [phone] Also accepts self-pay patients.  [facility] [address], [place]  [phone]   [place], [address], [place] [phone]   [facility] [address] [place] [phone]   Lucianne Lei [address], [place]   [phone] Only accepts [place] [facility] [place] patients after they have their name applied to their card.   Self-Pay (no insurance) in [facility]:  Organization         Address  Phone   Notes  Sickle Cell Patients, [facility] [place] [phone]   [facility] [place] [phone]   [facility] [place]  [place], [place], [place] [phone]   [facility]/Dr. Osei-Bonsu  [address], [place] or [address], [place] [phone] Phone number for both [place] and [place] locations is the same.  [facility] [address] [place] [phone]   [facility] [address] [place] or [address] [phone]   [facility] [address] [place] [phone], phone; [phone], fax Sees patients 1st and 3rd Saturday of every month.  Must not qualify for public or private insurance (i.e. Medicaid, Medicare, [place] Health Choice, Veterans' Benefits)  Household income should be no more than 200% of the poverty level The clinic cannot treat you if you are pregnant or think you are pregnant  Sexually transmitted diseases are not treated at the clinic.

## 2018-04-12 NOTE — ED Notes (Signed)
Pt stated that he has 2 hernias and one is a hiatal hernia. Pt stated that while he was vomiting a few weeks ago he felt a sharp pain in his chest where the hernia (that has been previously repaired is). Pain increases with movement, palpation, vomiting and inspiration. Pt reports GERD symptoms that his medicine will improve sometimes.

## 2018-04-12 NOTE — ED Triage Notes (Addendum)
Pt reports having chest pain and generalized weakness onset  X 1  Week . Pt also reports elevated BP at home. Pt is a difficult historian

## 2018-04-13 ENCOUNTER — Emergency Department (HOSPITAL_COMMUNITY)
Admission: EM | Admit: 2018-04-13 | Discharge: 2018-04-13 | Disposition: A | Discharge status: Home / Self Care | Payer: Medicare HMO | Attending: Emergency Medicine | Admitting: Emergency Medicine

## 2018-04-13 DIAGNOSIS — F209 Schizophrenia, unspecified: Secondary | ICD-10-CM | POA: Insufficient documentation

## 2018-04-13 DIAGNOSIS — I10 Essential (primary) hypertension: Secondary | ICD-10-CM | POA: Insufficient documentation

## 2018-04-13 DIAGNOSIS — F17210 Nicotine dependence, cigarettes, uncomplicated: Secondary | ICD-10-CM | POA: Insufficient documentation

## 2018-04-13 DIAGNOSIS — R1013 Epigastric pain: Secondary | ICD-10-CM | POA: Diagnosis not present

## 2018-04-13 DIAGNOSIS — Z5320 Procedure and treatment not carried out because of patient's decision for unspecified reasons: Secondary | ICD-10-CM | POA: Insufficient documentation

## 2018-04-13 DIAGNOSIS — Z79899 Other long term (current) drug therapy: Secondary | ICD-10-CM | POA: Diagnosis not present

## 2018-04-13 DIAGNOSIS — Z8659 Personal history of other mental and behavioral disorders: Secondary | ICD-10-CM

## 2018-04-13 NOTE — ED Notes (Signed)
PA at bedside speaking with pt

## 2018-04-13 NOTE — ED Provider Notes (Signed)
[facility] DEPT Provider Note   CSN: [id number] Arrival date & time: 04/13/18  0441     History   Chief Complaint Chief Complaint  Patient presents with  . Abdominal Pain  . Chest Pain    HPI [name] is a 62 y.o. male.  Patient with history of schizophrenia, HLD, HTN, chronic low back pain, thyroid disease, GERD presents to ED with complaint of epigastric abdominal and lower chest discomfort. He states he feels there is a syringe inside of him in the location of his pain that has been there since 2015. He has a CD disc of a CT done in 2015 that he states shows a foreign object in the upper abdominal area, and he feels this is still inside of him. The patient was seen and evaluated last night in the ED for chest pain, without mention of concern for FB per provider notes.   The history is provided by the patient. No language interpreter was used.  Abdominal Pain    Chest Pain   Associated symptoms include abdominal pain.    Past Medical History:  Diagnosis Date  . ALLERGIC RHINITIS 01/25/2007  . ANXIETY DISORDER, GENERALIZED 01/25/2007  . Arthritis   . DEPRESSION 01/25/2007  . ESOPHAGITIS 12/28/2007  . Fatty liver 10/09/2010  . GERD 01/25/2007  . GLUCOSE INTOLERANCE, HX OF 01/25/2007  . Heart murmur    hx of   . HEMORRHOIDS, INTERNAL, WITH BLEEDING 04/26/2008  . Hiatal hernia   . HYPERLIPIDEMIA 12/27/2007  . HYPERTENSION, ESSENTIAL NOS 01/25/2007  . Hyperthyroidism 08/11/2010  . HYPERTROPHY PROSTATE W/UR OBST & OTH LUTS 02/27/2010  . 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 LOW BACK PAIN, CHRONIC 11/08/2007  . Pancreatitis   . Rectal abscess   . 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 SCOLIOSIS NEC 01/25/2007  . Sebaceous cyst 08/10/2010  . SMOKER 08/05/2009  . UPPER GASTROINTESTINAL HEMORRHAGE 01/25/2007    Patient Active Problem List   Diagnosis Date Noted  . Cervical radiculopathy at C8 01/18/2018  . Right lateral epicondylitis  12/21/2017  . Pain in joint, shoulder region 04/23/2015  . Periumbilical abdominal pain 02/13/2015  . Elevated PSA 08/04/2014  . Subacromial bursitis 04/11/2014  . Neck pain on right side 03/05/2014  . Bilateral shoulder pain 03/05/2014  . Recurrent boils 03/05/2014  . Foreign body in stomach 12/31/2013  . Reflux esophagitis 12/31/2013  . Weight loss 05/06/2012  . Incisional hernia 11/23/2011  . Cramp of limb 01/13/2011  . Hyperthyroidism 08/11/2010  . Sebaceous cyst 08/10/2010  . HYPERTROPHY PROSTATE W/UR OBST & OTH LUTS 02/27/2010  . SMOKER 08/05/2009  . 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 HEMORRHOIDS, INTERNAL, WITH BLEEDING 04/26/2008  . Hyperlipidemia 12/27/2007  . LOW BACK PAIN, CHRONIC 11/08/2007  . 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 ANXIETY DISORDER, GENERALIZED 01/25/2007  . DEPRESSION 01/25/2007  . Essential hypertension 01/25/2007  . ALLERGIC RHINITIS 01/25/2007  . SCOLIOSIS NEC 01/25/2007  . Impaired glucose tolerance 01/25/2007    Past Surgical History:  Procedure Laterality Date  . ABDOMINAL EXPLORATION SURGERY  1988  . CHOLECYSTECTOMY  05/20/08   Dr. Zella Richer  . COLONOSCOPY  04/23/2010   normal  . ESOPHAGOGASTRODUODENOSCOPY N/A 12/31/2013   Procedure: ESOPHAGOGASTRODUODENOSCOPY (EGD);  Surgeon: Gatha Mayer, MD;  Location: [facility];  Service: Endoscopy;  Laterality: N/A;  . Excision of scalp lesion  07/2009  . Excision of thyroid mass    . GANGLION CYST EXCISION     right foot x 2  .  HERNIA REPAIR     ventral  . INCISIONAL HERNIA REPAIR  12/01/2011   Procedure: LAPAROSCOPIC INCISIONAL HERNIA;  Surgeon: Harl Bowie, MD;  Location: [facility];  Service: General;  Laterality: N/A;  . SHOULDER SURGERY     right        Home Medications    Prior to Admission medications   Medication Sig Start Date End Date Taking? Authorizing Provider  acetaminophen (TYLENOL) 500 MG tablet Take 1,000 mg by mouth every 6 (six) hours as needed for  moderate pain.     [provider]  amLODipine (NORVASC) 10 MG tablet Take 1 tablet (10 mg total) by mouth daily. 01/18/18   Marrian Salvage, FNP  cyclobenzaprine (FLEXERIL) 10 MG tablet TAKE 1 TABLET BY MOUTH EVERYDAY AT BEDTIME Patient taking differently: Take 10 mg by mouth at bedtime.  04/07/18   Marrian Salvage, FNP  fluPHENAZine (PROLIXIN) 10 MG tablet Take 2 tablets (20 mg total) by mouth daily. 10/19/17   Marrian Salvage, FNP  fluticasone (FLONASE) 50 MCG/ACT nasal spray Place 2 sprays into both nostrils daily. Patient taking differently: Place 2 sprays into both nostrils daily as needed for allergies.  12/23/17   Marrian Salvage, FNP  gabapentin (NEURONTIN) 300 MG capsule nightly Patient taking differently: Take 300 mg by mouth at bedtime. nightly 01/18/18   Lyndal Pulley, DO  KLOR-CON M20 20 MEQ tablet Take 1 tablet (20 mEq total) by mouth daily. 10/19/17   Marrian Salvage, FNP  losartan (COZAAR) 100 MG tablet Take 1 tablet (100 mg total) by mouth daily. 02/01/18   Marrian Salvage, FNP  naproxen sodium (ALEVE) 220 MG tablet Take 440 mg by mouth daily as needed (pain).    [provider]  pantoprazole (PROTONIX) 40 MG tablet Take 1 tablet (40 mg total) by mouth daily. 10/19/17   Marrian Salvage, FNP  rosuvastatin (CRESTOR) 10 MG tablet Take 1 tablet (10 mg total) by mouth daily. 11/21/17   Marrian Salvage, FNP    Family History Family History  Problem Relation Age of Onset  . Hypertension Father   . Hypertension Mother   . Prostate cancer Other   . Colon cancer Neg Hx     Social History Social History   Tobacco Use  . Smoking status: Current Every Day Smoker    Packs/day: 1.50    Years: 30.00    Pack years: 45.00    Types: Cigarettes  . Smokeless tobacco: Never Used  Substance Use Topics  . Alcohol use: No  . Drug use: Yes    Frequency: 14.0 times per week    Types: Marijuana     Allergies     Celecoxib; Iohexol; Metoclopramide hcl; and Wellbutrin [bupropion]   Review of Systems Review of Systems  Cardiovascular: Positive for chest pain.  Gastrointestinal: Positive for abdominal pain.     Physical Exam Updated Vital Signs BP (!) 139/91 (BP Location: Left Arm)   Pulse (!) 120   Temp 98.5 F (36.9 C) (Oral)   Resp 16   SpO2 100%   Physical Exam Constitutional:      Appearance: He is well-developed.  Neck:     Musculoskeletal: Normal range of motion.  Pulmonary:     Effort: Pulmonary effort is normal.  Musculoskeletal: Normal range of motion.  Skin:    General: Skin is warm and dry.  Neurological:     Mental Status: He is alert and oriented to person, place, and time.  ED Treatments / Results  Labs (all labs ordered are listed, but only abnormal results are displayed) Labs Reviewed - No data to display  EKG None  Radiology Dg Chest 2 View  Result Date: 04/12/2018 CLINICAL DATA:  Chest pain EXAM: CHEST - 2 VIEW COMPARISON:  03/24/2017 FINDINGS: The heart size and mediastinal contours are within normal limits. Both lungs are clear. The visualized skeletal structures are unremarkable. IMPRESSION: No active cardiopulmonary disease. Electronically Signed   By: Inez Catalina M.D.   On: 04/12/2018 20:10    Procedures Procedures (including critical care time)  Medications Ordered in ED Medications - No data to display   Initial Impression / Assessment and Plan / ED Course  I have reviewed the triage vital signs and the nursing notes.  Pertinent labs & imaging results that were available during my care of the patient were reviewed by me and considered in my medical decision making (see chart for details).     The patient presents with c/o epigastric AP and lower central chest pain. He does not give details of duration of symptoms, other symptoms or previous evaluations other than CT scan showing a FB from 2015. He is focused on and insistent that it  be removed.   Chart reviewed. CT 12/2103 shows a "Within the stomach there is a linear low-density structure which contains small locules of gas. The findings raise a question of a linear foreign body. Structure measures approximately 12 cm in length, 0.3 cm in diameter." Subseqent CT scans done (09/10/2015: "No acute abnormality seen within the abdomen or pelvis"; 09/12/2016: "IMPRESSION: Aortic atherosclerosis. Stable mild prostatic enlargement. Stable moderate size fat containing ventral hernia is noted in epigastric region. No other abnormality seen in the abdomen or pelvis."  When this is discussed with the patient during obtaining a history, the patient gets up out of the bed and grabs his belongings, stating that "if we don't want to take the syringe out he will go somewhere that will. And...it won't be in [place]".   Evaluation from previous ED visit within the last 12 hours shows a normal lab panel, including troponin. ACS was not felt to be a concern. The patient is tachycardic tonight but otherwise has normal VS. He does not appear intoxicated, is making no threats against anyone else or against himself. He is not felt to be a danger to himself or others.  Attempts were made to discuss his complaint further but he does not respond. He is told he is welcome to return at anytime.   Final Clinical Impressions(s) / ED Diagnoses   Final diagnoses:  None   1. Epigastric abdominal pain 2. History of schizophrenia  ED Discharge Orders    None       Dennie Bible 02/13/24 [id number]    Delora Fuel, MD 40/34/74 Delrae Rend

## 2018-04-13 NOTE — ED Triage Notes (Signed)
Pt has returned after medical screening done last PM and this AM for epigastric and chest pain. Pt continues to state he feels a foreign object in his chest and brought a computer disc with an image of the syringe the he states remains in his stomach. Mr Mcaulay request the disc be review by the EDP.

## 2018-04-13 NOTE — ED Notes (Addendum)
Pt walked out of the room stating "This is ridiculous. I am going to [facility] for surgery to figure this out." Pt would not sign AMA or let staff take vitals.

## 2018-04-14 LAB — URINE CULTURE: Culture: NO GROWTH

## 2018-04-21 ENCOUNTER — Ambulatory Visit: Payer: Medicare Other | Admitting: Family

## 2018-04-21 IMAGING — CT CT ABD-PELV W/O CM
2 of 4 series · 16 of 46 positions shown, 18 images · non-contrast
Comparison: CT scan of September 04, 2016.

CLINICAL DATA: Acute upper abdominal pain.

EXAM:
CT ABDOMEN AND PELVIS WITHOUT CONTRAST
TECHNIQUE: Multidetector CT imaging of the abdomen and pelvis was performed
following the standard protocol without IV contrast.

[Series 2: abd/pel w/o · axial · non-contrast · 0.94mm/px · z∈[-29,+416]mm · 13 of 101 slices shown, 15 images]
[im 6/101  soft-tissue]
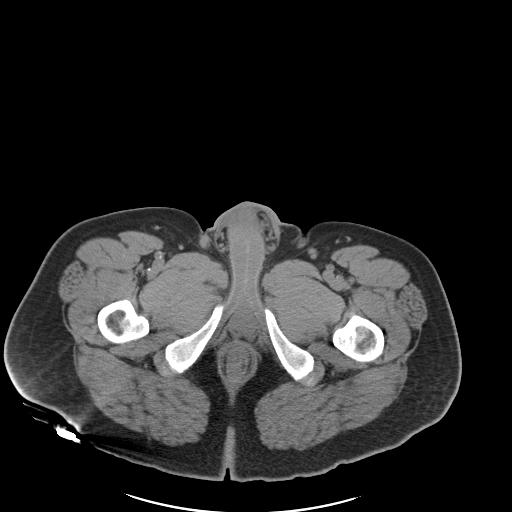
[im 6/101  bone]
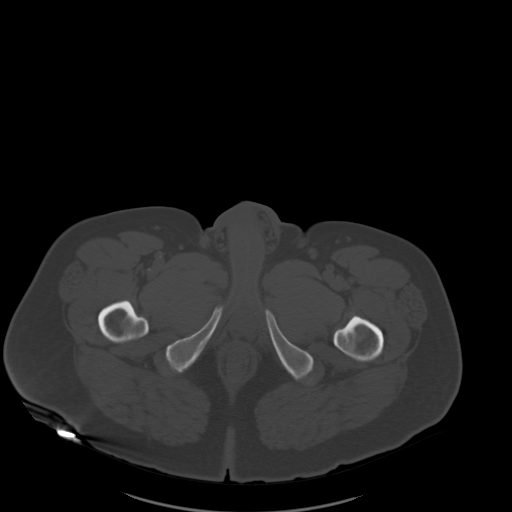
[im 12/101  soft-tissue]
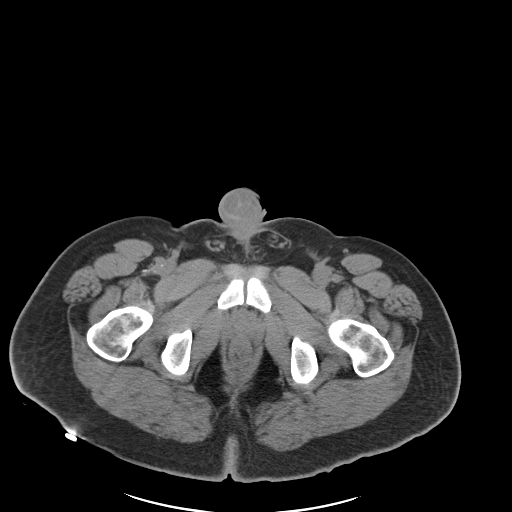
[im 24/101  soft-tissue]
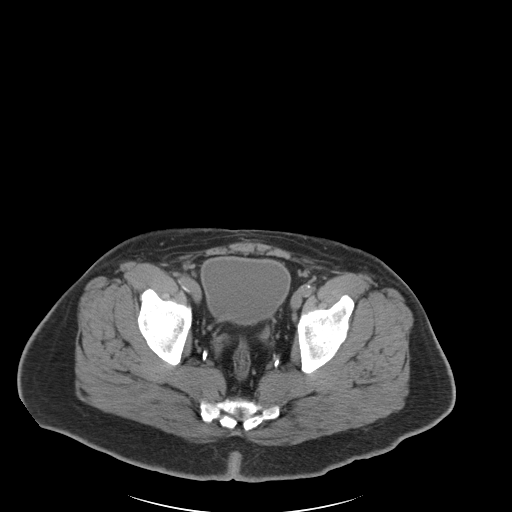
[im 30/101  soft-tissue]
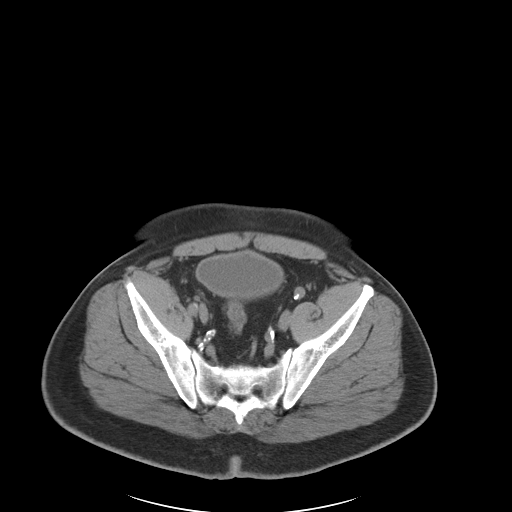
[im 36/101  soft-tissue]
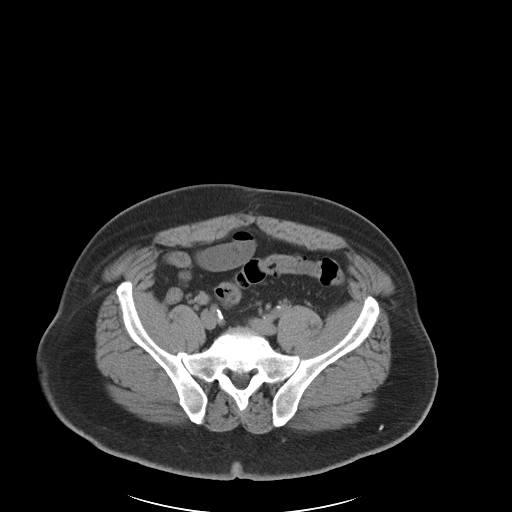
[im 42/101  soft-tissue]
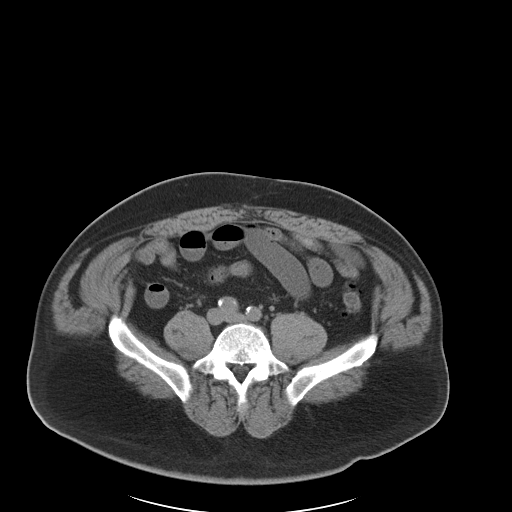
[im 53/101  soft-tissue]
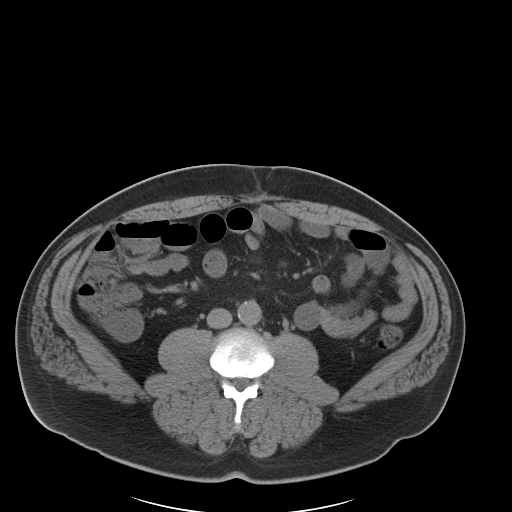
[im 59/101  soft-tissue]
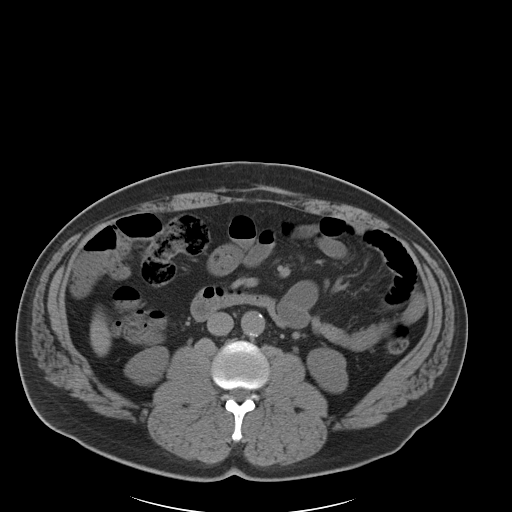
[im 65/101  soft-tissue]
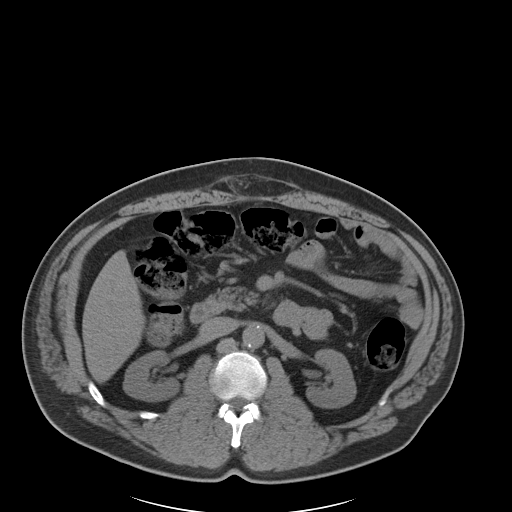
[im 65/101  bone]
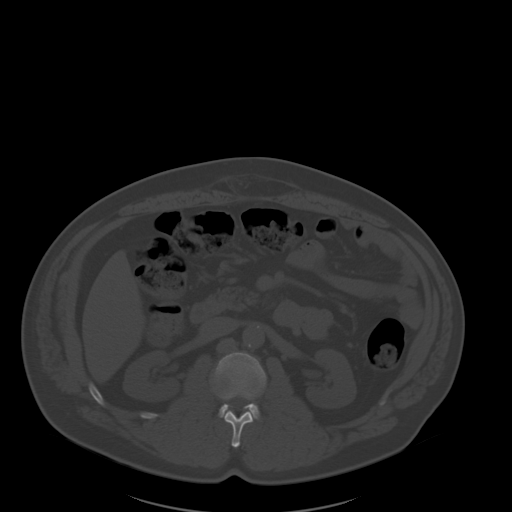
[im 71/101  soft-tissue]
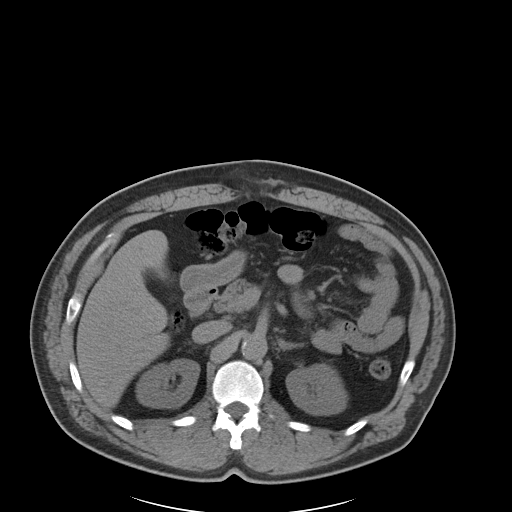
[im 77/101  soft-tissue]
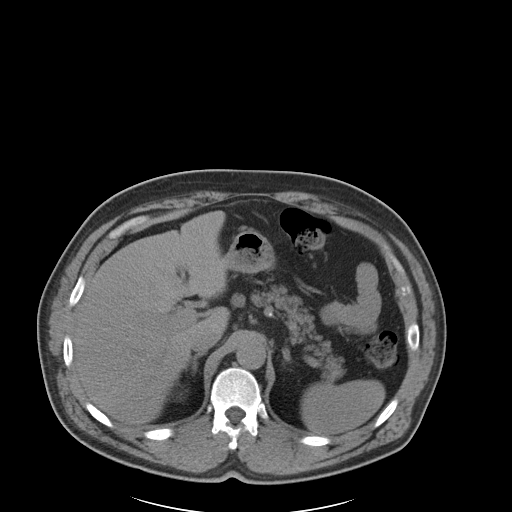
[im 89/101  soft-tissue]
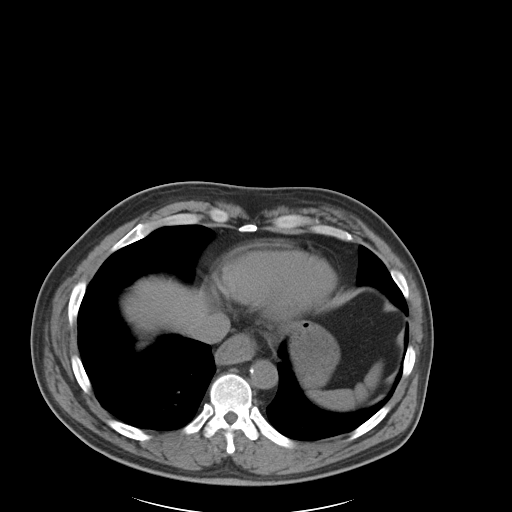
[im 95/101  soft-tissue]
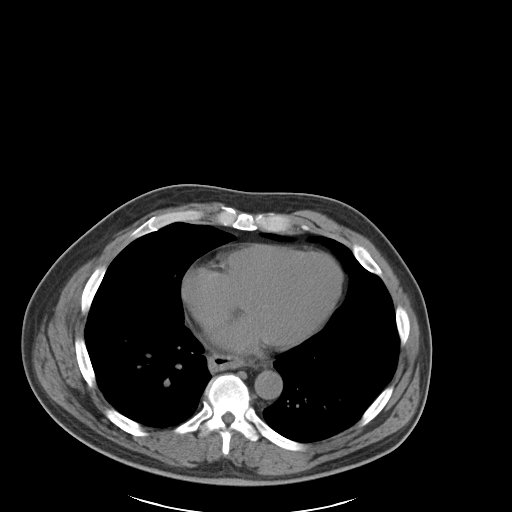

[Series 3: coronal · coronal · 0.84mm/px · 3 of 191 slices shown]
[im 64/191  soft-tissue]
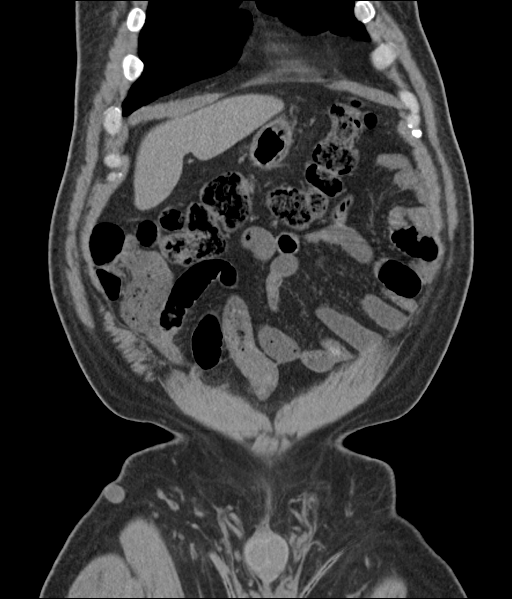
[im 85/191  soft-tissue]
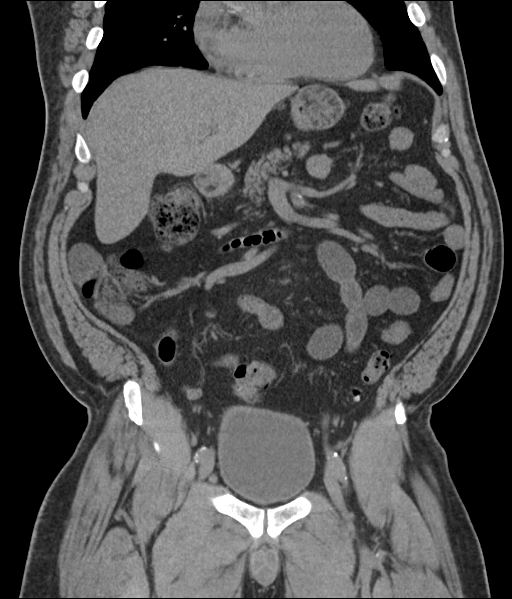
[im 106/191  soft-tissue]
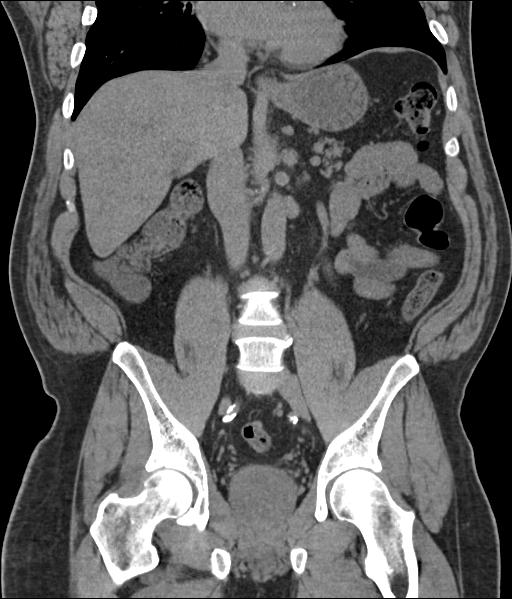

[16 of 46 positions shown; findings below may reference images not displayed]

FINDINGS: Lower chest: No acute abnormality.

Hepatobiliary: No focal liver abnormality is seen. Status post
cholecystectomy. No biliary dilatation.

Pancreas: Unremarkable. No pancreatic ductal dilatation or
surrounding inflammatory changes.

Spleen: Normal in size without focal abnormality.

Adrenals/Urinary Tract: Adrenal glands are unremarkable. Kidneys are
normal, without renal calculi, focal lesion, or hydronephrosis.
Bladder is unremarkable.

Stomach/Bowel: There is no evidence of bowel obstruction. No
inflammatory changes are noted. The appendix is not visualized.

Vascular/Lymphatic: Aortic atherosclerosis. No enlarged abdominal or
pelvic lymph nodes.

Reproductive: Stable mild prostatic enlargement is noted.

Other: Stable moderate size fat containing ventral hernia is seen in
the epigastric region. No abnormal fluid collection is noted. Stable
subcutaneous nodule is seen in anterior abdominal wall in the right
lower quadrant.

Musculoskeletal: No acute or significant osseous findings.
IMPRESSION: Aortic atherosclerosis.

Stable mild prostatic enlargement.

Stable moderate size fat containing ventral hernia is noted in
epigastric region.

No other abnormality seen in the abdomen or pelvis.

## 2018-04-24 ENCOUNTER — Encounter: Payer: Self-pay | Admitting: Family

## 2018-04-24 ENCOUNTER — Ambulatory Visit (INDEPENDENT_AMBULATORY_CARE_PROVIDER_SITE_OTHER): Payer: Medicare HMO | Admitting: Family

## 2018-04-24 VITALS — BP 150/98 | HR 110 | Temp 98.4°F | Ht 68.0 in | Wt 234.1 lb

## 2018-04-24 DIAGNOSIS — E782 Mixed hyperlipidemia: Secondary | ICD-10-CM | POA: Diagnosis not present

## 2018-04-24 DIAGNOSIS — R4689 Other symptoms and signs involving appearance and behavior: Secondary | ICD-10-CM

## 2018-04-24 DIAGNOSIS — F209 Schizophrenia, unspecified: Secondary | ICD-10-CM

## 2018-04-24 DIAGNOSIS — M542 Cervicalgia: Secondary | ICD-10-CM

## 2018-04-24 DIAGNOSIS — E876 Hypokalemia: Secondary | ICD-10-CM

## 2018-04-24 DIAGNOSIS — Z720 Tobacco use: Secondary | ICD-10-CM

## 2018-04-24 DIAGNOSIS — I10 Essential (primary) hypertension: Secondary | ICD-10-CM | POA: Diagnosis not present

## 2018-04-24 MED ORDER — ROSUVASTATIN CALCIUM 10 MG PO TABS: 10.0000 mg | ORAL_TABLET | Freq: Every day | ORAL | 1 refills | Status: DC

## 2018-04-24 NOTE — Progress Notes (Signed)
[name] is a 63 y.o. male with the following history as recorded in Epic[employer]:  Patient Active Problem List   Diagnosis Date Noted  . Cervical radiculopathy at C8 01/18/2018  . Right lateral epicondylitis 12/21/2017  . Pain in joint, shoulder region 04/23/2015  . Periumbilical abdominal pain 02/13/2015  . Elevated PSA 08/04/2014  . Subacromial bursitis 04/11/2014  . Neck pain on right side 03/05/2014  . Bilateral shoulder pain 03/05/2014  . Recurrent boils 03/05/2014  . Foreign body in stomach 12/31/2013  . Reflux esophagitis 12/31/2013  . Weight loss 05/06/2012  . Incisional hernia 11/23/2011  . Cramp of limb 01/13/2011  . Hyperthyroidism 08/11/2010  . Sebaceous cyst 08/10/2010  . HYPERTROPHY PROSTATE W/UR OBST & OTH LUTS 02/27/2010  . SMOKER 08/05/2009  . 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 HEMORRHOIDS, INTERNAL, WITH BLEEDING 04/26/2008  . Hyperlipidemia 12/27/2007  . LOW BACK PAIN, CHRONIC 11/08/2007  . Schizophrenia ([place]) 01/25/2007  . ANXIETY DISORDER, GENERALIZED 01/25/2007  . DEPRESSION 01/25/2007  . Essential hypertension 01/25/2007  . ALLERGIC RHINITIS 01/25/2007  . SCOLIOSIS NEC 01/25/2007  . Impaired glucose tolerance 01/25/2007    Current Outpatient Medications  Medication Sig Dispense Refill  . acetaminophen (TYLENOL) 500 MG tablet Take 1,000 mg by mouth every 6 (six) hours as needed for moderate pain.     [name] amLODipine (NORVASC) 10 MG tablet Take 1 tablet (10 mg total) by mouth daily. 30 tablet 3  . cyclobenzaprine (FLEXERIL) 10 MG tablet TAKE 1 TABLET BY MOUTH EVERYDAY AT BEDTIME (Patient taking differently: Take 10 mg by mouth at bedtime. ) 30 tablet 0  . fluPHENAZine (PROLIXIN) 10 MG tablet Take 2 tablets (20 mg total) by mouth daily. 60 tablet 0  . fluticasone (FLONASE) 50 MCG/ACT nasal spray Place 2 sprays into both nostrils daily. (Patient taking differently: Place 2 sprays into both nostrils daily as needed for allergies. ) 16 g 6  .  gabapentin (NEURONTIN) 300 MG capsule nightly (Patient taking differently: Take 300 mg by mouth at bedtime. nightly) 30 capsule 3  . KLOR-CON M20 20 MEQ tablet Take 1 tablet (20 mEq total) by mouth daily. 30 tablet 6  . losartan (COZAAR) 100 MG tablet Take 1 tablet (100 mg total) by mouth daily. 90 tablet 1  . naproxen sodium (ALEVE) 220 MG tablet Take 440 mg by mouth daily as needed (pain).    . pantoprazole (PROTONIX) 40 MG tablet Take 1 tablet (40 mg total) by mouth daily. 30 tablet 6  . rosuvastatin (CRESTOR) 10 MG tablet Take 1 tablet (10 mg total) by mouth daily. 90 tablet 1  . rosuvastatin (CRESTOR) 10 MG tablet Take 1 tablet (10 mg total) by mouth daily. 90 tablet 1   No current facility-administered medications for this visit.     Allergies: Celecoxib; Iohexol; Metoclopramide hcl; and Wellbutrin [bupropion]  Past Medical History:  Diagnosis Date  . ALLERGIC RHINITIS 01/25/2007  . ANXIETY DISORDER, GENERALIZED 01/25/2007  . Arthritis   . DEPRESSION 01/25/2007  . ESOPHAGITIS 12/28/2007  . Fatty liver 10/09/2010  . GERD 01/25/2007  . GLUCOSE INTOLERANCE, HX OF 01/25/2007  . Heart murmur    hx of   . HEMORRHOIDS, INTERNAL, WITH BLEEDING 04/26/2008  . Hiatal hernia   . HYPERLIPIDEMIA 12/27/2007  . HYPERTENSION, ESSENTIAL NOS 01/25/2007  . Hyperthyroidism 08/11/2010  . HYPERTROPHY PROSTATE W/UR OBST & OTH LUTS 02/27/2010  . 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 LOW BACK PAIN, CHRONIC 11/08/2007  . Pancreatitis   . Rectal abscess   .  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 SCOLIOSIS NEC 01/25/2007  . Sebaceous cyst 08/10/2010  . SMOKER 08/05/2009  . UPPER GASTROINTESTINAL HEMORRHAGE 01/25/2007    Past Surgical History:  Procedure Laterality Date  . ABDOMINAL EXPLORATION SURGERY  1988  . CHOLECYSTECTOMY  05/20/08   Dr. Zella Richer  . COLONOSCOPY  04/23/2010   normal  . ESOPHAGOGASTRODUODENOSCOPY N/A 12/31/2013   Procedure: ESOPHAGOGASTRODUODENOSCOPY (EGD);  Surgeon: Gatha Mayer, MD;   Location: [facility];  Service: Endoscopy;  Laterality: N/A;  . Excision of scalp lesion  07/2009  . Excision of thyroid mass    . GANGLION CYST EXCISION     right foot x 2  . HERNIA REPAIR     ventral  . INCISIONAL HERNIA REPAIR  12/01/2011   Procedure: LAPAROSCOPIC INCISIONAL HERNIA;  Surgeon: Harl Bowie, MD;  Location: [facility];  Service: General;  Laterality: N/A;  . SHOULDER SURGERY     right    Family History  Problem Relation Age of Onset  . Hypertension Father   . Hypertension Mother   . Prostate cancer Other   . Colon cancer Neg Hx     Social History   Tobacco Use  . Smoking status: Current Every Day Smoker    Packs/day: 1.50    Years: 30.00    Pack years: 45.00    Types: Cigarettes  . Smokeless tobacco: Never Used  Substance Use Topics  . Alcohol use: No    Subjective:  6 month follow-up on hypertension/ hyperlipidemia;  Notes he has not been taking his Amlodipine daily; only taking his Losartan; Has not been taking his cholesterol medication; Potassium was low at recent ER visit- admits he is not taking his potassium regularly either;   Very agitated today-complaining of pain in his left neck/ shoulder; known history of pinched nerve in neck; has been working with orthopedist for pain management/ planning to go to their urgent care later today;     Objective:  Vitals:   04/24/18 0851  BP: (!) 150/98  Pulse: (!) 110  Temp: 98.4 F (36.9 C)  TempSrc: Oral  SpO2: 98%  Weight: 234 lb 1.9 oz (106.2 kg)  Height: 5\' 8"  (1.727 m)    General: Well developed, well nourished, in no acute distress  Skin : Warm and dry.  Head: Normocephalic and atraumatic  Lungs: Respirations unlabored; clear to auscultation bilaterally without wheeze, rales, rhonchi  CVS exam: normal rate and regular rhythm.  Neurologic: Alert and oriented; speech intact; face symmetrical; moves all extremities well; CNII-XII intact without focal deficit   Assessment:  1. Essential  hypertension   2. Mixed hyperlipidemia   3. Schizophrenia, unspecified type ([place])   4. Tobacco abuse   5. Neck pain   6. Hypokalemia   7. Non-compliant behavior     Plan:  1. Uncontrolled; not taking medications as prescribed; stressed need to take medications; follow-up in 6 months; 2. Uncontrolled; not taking medications as prescribed; stressed need to take medications; follow-up in 6 months; 3. Encouraged to follow-up with his psychiatrist- seems very anxious and agitated today; 4. Needs to quit smoking- admits he is not interested in quitting; 5. Will not be providing pain medication for this patient- he is encouraged to see his orthopedist; 6. Stressed to take his potassium daily;   Return in about 6 months (around 10/23/2018).  No orders of the defined types were placed in this encounter.   Requested Prescriptions   Signed Prescriptions Disp Refills  . rosuvastatin (CRESTOR) 10  MG tablet 90 tablet 1    Sig: Take 1 tablet (10 mg total) by mouth daily.  . rosuvastatin (CRESTOR) 10 MG tablet 90 tablet 1    Sig: Take 1 tablet (10 mg total) by mouth daily.

## 2018-05-05 ENCOUNTER — Other Ambulatory Visit: Payer: Self-pay | Admitting: Family

## 2018-05-15 ENCOUNTER — Other Ambulatory Visit: Payer: Self-pay | Admitting: Family

## 2018-05-15 MED ORDER — AMLODIPINE BESYLATE 10 MG PO TABS: 10.0000 mg | ORAL_TABLET | Freq: Every day | ORAL | 1 refills | Status: DC

## 2018-05-31 ENCOUNTER — Telehealth: Payer: Self-pay | Admitting: Family

## 2018-05-31 NOTE — Telephone Encounter (Signed)
Copied from [place] [id number]. Topic: Quick Communication - See Telephone Encounter >> May 31, 2018  4:06 PM Rutherford Nail, NT wrote: CRM for notification. See Telephone encounter for: 05/31/18. Patient calling and states that he is needing a prescription for a blood pressure machine sent to the pharmacy. Please advise. [place], [place]

## 2018-06-01 MED ORDER — BLOOD PRESSURE CUFF MISC: 0 refills | Status: DC

## 2018-06-01 NOTE — Telephone Encounter (Signed)
Please clarify where he wants the prescription for the blood pressure cuff; these are usually at local pharmacies- not mail order?

## 2018-06-18 ENCOUNTER — Other Ambulatory Visit: Payer: Self-pay | Admitting: Family Medicine

## 2018-07-13 ENCOUNTER — Other Ambulatory Visit: Payer: Self-pay | Admitting: Family Medicine

## 2018-07-18 ENCOUNTER — Other Ambulatory Visit: Payer: Self-pay | Admitting: Family

## 2018-08-24 ENCOUNTER — Ambulatory Visit: Payer: Self-pay

## 2018-08-24 NOTE — Telephone Encounter (Signed)
Returned call to pt.  Stated his rectum is "real sore", from his biopsy on Monday.  Is asking about using a suppository for possible hemorrhoidal pain.  Stated he had hemorrhoids removed in 1978.  Denied any signs of bleeding at present.  Stated "the bleeding has stopped."  Advised pt. to contact his surgeon re: treatment options for the pain in the rectum, due to recent rectal bx. On 5/4.  Pt. requested the phone number; gave pt. # for [facility] @ [phone]; stated Dr. Louis Meckel is his surgeon.  Pt. agreed to contact the surgeon.     Message from Scherrie Gerlach sent at 08/24/2018 12:35 PM EDT   Pt states he had a biopsy Monday and is still sore. Pt states he believes his hemorrhoids are bothering him from the inside. Would like to know if a suppository might help. He spoke with the surgeon this am and told him he was sore, but forgot to ask about using a suppository.     Reason for Disposition . [1] Caller requesting NON-URGENT health information AND [2] PCP's office is the best resource    Advised to contact his surgeon, Dr. Louis Meckel, at [facility], to get recommendation for pain, due to recent bx. Of rectum.  Answer Assessment - Initial Assessment Questions 1. REASON FOR CALL or QUESTION: "What is your reason for calling today?" or "How can I best help you?" or "What question do you have that I can help answer?"     Pt. asking if it would be okay to use a suppository for the pain in his rectum, since he had a biopsy of the rectum on Monday, 5/4?  Protocols used: INFORMATION ONLY CALL-A-AH

## 2018-08-28 ENCOUNTER — Ambulatory Visit: Payer: Medicare HMO | Admitting: Family

## 2018-08-30 ENCOUNTER — Other Ambulatory Visit: Payer: Self-pay

## 2018-08-30 ENCOUNTER — Emergency Department (HOSPITAL_COMMUNITY)
Admission: EM | Admit: 2018-08-30 | Discharge: 2018-08-30 | Disposition: A | Discharge status: Home / Self Care | Payer: Medicare HMO | Attending: Emergency Medicine | Admitting: Emergency Medicine

## 2018-08-30 ENCOUNTER — Encounter (HOSPITAL_COMMUNITY): Payer: Self-pay

## 2018-08-30 DIAGNOSIS — I10 Essential (primary) hypertension: Secondary | ICD-10-CM | POA: Insufficient documentation

## 2018-08-30 DIAGNOSIS — Z79899 Other long term (current) drug therapy: Secondary | ICD-10-CM | POA: Diagnosis not present

## 2018-08-30 DIAGNOSIS — F17210 Nicotine dependence, cigarettes, uncomplicated: Secondary | ICD-10-CM | POA: Diagnosis not present

## 2018-08-30 DIAGNOSIS — N390 Urinary tract infection, site not specified: Secondary | ICD-10-CM | POA: Insufficient documentation

## 2018-08-30 DIAGNOSIS — R339 Retention of urine, unspecified: Secondary | ICD-10-CM | POA: Insufficient documentation

## 2018-08-30 LAB — URINALYSIS, ROUTINE W REFLEX MICROSCOPIC
Bilirubin Urine: NEGATIVE
Glucose, UA: NEGATIVE mg/dL
Ketones, ur: NEGATIVE mg/dL
Nitrite: NEGATIVE
Protein, ur: NEGATIVE mg/dL
Specific Gravity, Urine: 1.003 — ABNORMAL LOW (ref 1.005–1.030)
pH: 7 (ref 5.0–8.0)

## 2018-08-30 LAB — CBC WITH DIFFERENTIAL/PLATELET
Abs Immature Granulocytes: 0.15 10*3/uL — ABNORMAL HIGH (ref 0.00–0.07)
Basophils Absolute: 0.1 10*3/uL (ref 0.0–0.1)
Basophils Relative: 0 %
Eosinophils Absolute: 0.2 10*3/uL (ref 0.0–0.5)
Eosinophils Relative: 1 %
HCT: 38.1 % — ABNORMAL LOW (ref 39.0–52.0)
Hemoglobin: 12.7 g/dL — ABNORMAL LOW (ref 13.0–17.0)
Immature Granulocytes: 1 %
Lymphocytes Relative: 16 %
Lymphs Abs: 2.3 10*3/uL (ref 0.7–4.0)
MCH: 29.5 pg (ref 26.0–34.0)
MCHC: 33.3 g/dL (ref 30.0–36.0)
MCV: 88.4 fL (ref 80.0–100.0)
Monocytes Absolute: 1.1 10*3/uL — ABNORMAL HIGH (ref 0.1–1.0)
Monocytes Relative: 8 %
Neutro Abs: 10.7 10*3/uL — ABNORMAL HIGH (ref 1.7–7.7)
Neutrophils Relative %: 74 %
Platelets: 379 10*3/uL (ref 150–400)
RBC: 4.31 MIL/uL (ref 4.22–5.81)
RDW: 13.9 % (ref 11.5–15.5)
WBC: 14.5 10*3/uL — ABNORMAL HIGH (ref 4.0–10.5)
nRBC: 0 % (ref 0.0–0.2)

## 2018-08-30 LAB — BASIC METABOLIC PANEL
Anion gap: 10 (ref 5–15)
BUN: 8 mg/dL (ref 8–23)
CO2: 25 mmol/L (ref 22–32)
Calcium: 8.9 mg/dL (ref 8.9–10.3)
Chloride: 101 mmol/L (ref 98–111)
Creatinine, Ser: 1.32 mg/dL — ABNORMAL HIGH (ref 0.61–1.24)
GFR calc Af Amer: >60 mL/min (ref >60)
GFR calc non Af Amer: 58 mL/min — ABNORMAL LOW (ref >60)
Glucose, Bld: 107 mg/dL — ABNORMAL HIGH (ref 70–99)
Potassium: 3.5 mmol/L (ref 3.5–5.1)
Sodium: 136 mmol/L (ref 135–145)

## 2018-08-30 MED ORDER — CEPHALEXIN 500 MG PO CAPS
500.0000 mg | ORAL_CAPSULE | Freq: Once | ORAL | Status: AC
  Administered 2018-08-30: 500 mg via ORAL
  Filled 2018-08-30: qty 1

## 2018-08-30 MED ORDER — CEPHALEXIN 500 MG PO CAPS: 500.0000 mg | ORAL_CAPSULE | Freq: Two times a day (BID) | ORAL | 0 refills | Status: DC

## 2018-08-30 MED ORDER — LIDOCAINE HCL URETHRAL/MUCOSAL 2 % EX PRSY
1.0000 "application " | PREFILLED_SYRINGE | Freq: Once | CUTANEOUS | Status: AC | PRN
  Administered 2018-08-30: 1 via URETHRAL
  Filled 2018-08-30: qty 5

## 2018-08-30 NOTE — ED Triage Notes (Signed)
Pt reports painful dysuria x 4 days. He states that his doctor told him he had an infection, but pt is unsure what kind. Denies fever. He reports pain and burning in his penis at this time. A&Ox4. Ambulatory.

## 2018-08-30 NOTE — ED Provider Notes (Signed)
[facility] DEPT Provider Note   CSN: [id number] Arrival date & time: 08/30/18  0533    History   Chief Complaint Chief Complaint  Patient presents with  . Urinary Retention    HPI [name] is a 63 y.o. male.     Patient presents to the emergency department with a chief complaint of urinary retention.  He states that he recently had prostate biopsy performed.  States that he has had some dysuria since then.  He states that his urologist called him in an antibiotic.  States he has had difficulty urinating since yesterday.  Reports some small hematuria.[name]  He denies any fever, chills, nausea, or vomiting.  He reports some suprapubic abdominal pressure.  States that he denies any other associated symptoms.  Last bowel movement was yesterday.  The history is provided by the patient. No language interpreter was used.    Past Medical History:  Diagnosis Date  . ALLERGIC RHINITIS 01/25/2007  . ANXIETY DISORDER, GENERALIZED 01/25/2007  . Arthritis   . DEPRESSION 01/25/2007  . ESOPHAGITIS 12/28/2007  . Fatty liver 10/09/2010  . GERD 01/25/2007  . GLUCOSE INTOLERANCE, HX OF 01/25/2007  . Heart murmur    hx of   . HEMORRHOIDS, INTERNAL, WITH BLEEDING 04/26/2008  . Hiatal hernia   . HYPERLIPIDEMIA 12/27/2007  . HYPERTENSION, ESSENTIAL NOS 01/25/2007  . Hyperthyroidism 08/11/2010  . HYPERTROPHY PROSTATE W/UR OBST & OTH LUTS 02/27/2010  . 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 LOW BACK PAIN, CHRONIC 11/08/2007  . Pancreatitis   . Rectal abscess   . 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 SCOLIOSIS NEC 01/25/2007  . Sebaceous cyst 08/10/2010  . SMOKER 08/05/2009  . UPPER GASTROINTESTINAL HEMORRHAGE 01/25/2007    Patient Active Problem List   Diagnosis Date Noted  . Cervical radiculopathy at C8 01/18/2018  . Right lateral epicondylitis 12/21/2017  . Pain in joint, shoulder region 04/23/2015  . Periumbilical abdominal pain 02/13/2015  . Elevated PSA  08/04/2014  . Subacromial bursitis 04/11/2014  . Neck pain on right side 03/05/2014  . Bilateral shoulder pain 03/05/2014  . Recurrent boils 03/05/2014  . Foreign body in stomach 12/31/2013  . Reflux esophagitis 12/31/2013  . Weight loss 05/06/2012  . Incisional hernia 11/23/2011  . Cramp of limb 01/13/2011  . Hyperthyroidism 08/11/2010  . Sebaceous cyst 08/10/2010  . HYPERTROPHY PROSTATE W/UR OBST & OTH LUTS 02/27/2010  . SMOKER 08/05/2009  . 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 HEMORRHOIDS, INTERNAL, WITH BLEEDING 04/26/2008  . Hyperlipidemia 12/27/2007  . LOW BACK PAIN, CHRONIC 11/08/2007  . Schizophrenia ([place]) 01/25/2007  . ANXIETY DISORDER, GENERALIZED 01/25/2007  . DEPRESSION 01/25/2007  . Essential hypertension 01/25/2007  . ALLERGIC RHINITIS 01/25/2007  . SCOLIOSIS NEC 01/25/2007  . Impaired glucose tolerance 01/25/2007    Past Surgical History:  Procedure Laterality Date  . ABDOMINAL EXPLORATION SURGERY  1988  . CHOLECYSTECTOMY  05/20/08   Dr. Zella Richer  . COLONOSCOPY  04/23/2010   normal  . ESOPHAGOGASTRODUODENOSCOPY N/A 12/31/2013   Procedure: ESOPHAGOGASTRODUODENOSCOPY (EGD);  Surgeon: Gatha Mayer, MD;  Location: [facility];  Service: Endoscopy;  Laterality: N/A;  . Excision of scalp lesion  07/2009  . Excision of thyroid mass    . GANGLION CYST EXCISION     right foot x 2  . HERNIA REPAIR     ventral  . INCISIONAL HERNIA REPAIR  12/01/2011   Procedure: LAPAROSCOPIC INCISIONAL HERNIA;  Surgeon: Harl Bowie, MD;  Location: [facility];  Service:  General;  Laterality: N/A;  . SHOULDER SURGERY     right        Home Medications    Prior to Admission medications   Medication Sig Start Date End Date Taking? Authorizing Provider  acetaminophen (TYLENOL) 500 MG tablet Take 1,000 mg by mouth every 6 (six) hours as needed for moderate pain.     [provider]  amLODipine (NORVASC) 10 MG tablet Take 1 tablet (10 mg total) by mouth daily. 05/15/18    Marrian Salvage, FNP  Blood Pressure Monitoring (BLOOD PRESSURE CUFF) MISC Use daily as directed to check blood pressure 06/01/18   Marrian Salvage, FNP  cyclobenzaprine (FLEXERIL) 10 MG tablet TAKE 1 TABLET BY MOUTH EVERYDAY AT BEDTIME 07/18/18   Marrian Salvage, FNP  fluPHENAZine (PROLIXIN) 10 MG tablet Take 2 tablets (20 mg total) by mouth daily. 10/19/17   Marrian Salvage, FNP  fluticasone (FLONASE) 50 MCG/ACT nasal spray Place 2 sprays into both nostrils daily. Patient taking differently: Place 2 sprays into both nostrils daily as needed for allergies.  12/23/17   Marrian Salvage, FNP  gabapentin (NEURONTIN) 300 MG capsule TAKE 1 CAPSULE BY MOUTH EVERYDAY AT BEDTIME 07/13/18   Hulan Saas M, DO  KLOR-CON M20 20 MEQ tablet Take 1 tablet (20 mEq total) by mouth daily. 10/19/17   Marrian Salvage, FNP  losartan (COZAAR) 100 MG tablet Take 1 tablet (100 mg total) by mouth daily. 02/01/18   Marrian Salvage, FNP  naproxen sodium (ALEVE) 220 MG tablet Take 440 mg by mouth daily as needed (pain).    [provider]  pantoprazole (PROTONIX) 40 MG tablet Take 1 tablet (40 mg total) by mouth daily. 10/19/17   Marrian Salvage, FNP  rosuvastatin (CRESTOR) 10 MG tablet Take 1 tablet (10 mg total) by mouth daily. 04/24/18   Marrian Salvage, FNP  rosuvastatin (CRESTOR) 10 MG tablet Take 1 tablet (10 mg total) by mouth daily. 04/24/18   Marrian Salvage, FNP    Family History Family History  Problem Relation Age of Onset  . Hypertension Father   . Hypertension Mother   . Prostate cancer Other   . Colon cancer Neg Hx     Social History Social History   Tobacco Use  . Smoking status: Current Every Day Smoker    Packs/day: 1.50    Years: 30.00    Pack years: 45.00    Types: Cigarettes  . Smokeless tobacco: Never Used  Substance Use Topics  . Alcohol use: No  . Drug use: Yes    Frequency: 14.0 times per week    Types: Marijuana      Allergies   Celecoxib; Iohexol; Metoclopramide hcl; and Wellbutrin [bupropion]   Review of Systems Review of Systems  All other systems reviewed and are negative.    Physical Exam Updated Vital Signs BP 140/88 (BP Location: Right Arm)   Pulse 92   Temp 98 F (36.7 C) (Oral)   Resp 17   Ht 5\' 9"  (1.753 m)   Wt 104.3 kg   SpO2 97%   BMI 33.97 kg/m   Physical Exam Vitals signs and nursing note reviewed.  Constitutional:      Appearance: He is well-developed.  HENT:     Head: Normocephalic and atraumatic.  Eyes:     Conjunctiva/sclera: Conjunctivae normal.  Neck:     Musculoskeletal: Neck supple.  Cardiovascular:     Rate and Rhythm: Normal rate and regular rhythm.  Heart sounds: No murmur.  Pulmonary:     Effort: Pulmonary effort is normal. No respiratory distress.     Breath sounds: Normal breath sounds.  Abdominal:     Palpations: Abdomen is soft.     Tenderness: There is no abdominal tenderness.     Comments: Mild suprapubic discomfort  Musculoskeletal: Normal range of motion.  Skin:    General: Skin is warm and dry.  Neurological:     Mental Status: He is alert and oriented to person, place, and time.  Psychiatric:        Mood and Affect: Mood normal.        Behavior: Behavior normal.        Thought Content: Thought content normal.        Judgment: Judgment normal.      ED Treatments / Results  Labs (all labs ordered are listed, but only abnormal results are displayed) Labs Reviewed  CBC WITH DIFFERENTIAL/PLATELET - Abnormal; Notable for the following components:      Result Value   WBC 14.5 (*)    Hemoglobin 12.7 (*)    HCT 38.1 (*)    Neutro Abs 10.7 (*)    Monocytes Absolute 1.1 (*)    Abs Immature Granulocytes 0.15 (*)    All other components within normal limits  BASIC METABOLIC PANEL - Abnormal; Notable for the following components:   Glucose, Bld 107 (*)    Creatinine, Ser 1.32 (*)    GFR calc non Af Amer 58 (*)    All other  components within normal limits  URINE CULTURE  URINALYSIS, ROUTINE W REFLEX MICROSCOPIC    EKG None  Radiology No results found.  Procedures Procedures (including critical care time)  Medications Ordered in ED Medications  lidocaine (XYLOCAINE) 2 % jelly 1 application (1 application Urethral Given 08/30/18 0559)     Initial Impression / Assessment and Plan / ED Course  I have reviewed the triage vital signs and the nursing notes.  Pertinent labs & imaging results that were available during my care of the patient were reviewed by me and considered in my medical decision making (see chart for details).        Patient with urinary retention.  Had prostate biopsy recently, subsequently developed UTI, for which she is being treated with antibiotics by his urologist.  States that he had had difficulty with urination since last night.  Bladder scan shows 920 mL of urine.  Foley catheter inserted with successful decompression of bladder.    Afebrile.  Non-toxic appearing.  Urine culture pending.  Will discharge with keflex.  Recommend urology follow-up.  Final Clinical Impressions(s) / ED Diagnoses   Final diagnoses:  Urinary retention  Urinary tract infection without hematuria, site unspecified    ED Discharge Orders         Ordered    cephALEXin (KEFLEX) 500 MG capsule  2 times daily     08/30/18 0649           Montine Circle, PA-C 08/30/18 4580    Varney Biles, MD 08/31/18 [phone]

## 2018-08-30 NOTE — ED Notes (Signed)
Pt given and verbalized understanding of d/c instructions and need for follow up with pcp and urology. Told to return if s/s worsen. No further distress or questions at time of ambulation out of department.

## 2018-08-30 NOTE — ED Notes (Signed)
Meds given and leg bag applied to pt. Pt educated and verbalized understanding of the use of a leg bag and how to care for himself with a catheter. NAD

## 2018-08-31 LAB — URINE CULTURE: Culture: NO GROWTH

## 2018-09-03 ENCOUNTER — Emergency Department (HOSPITAL_COMMUNITY)
Admission: EM | Admit: 2018-09-03 | Discharge: 2018-09-03 | Disposition: A | Discharge status: Home / Self Care | Payer: Medicare HMO | Attending: Emergency Medicine | Admitting: Emergency Medicine

## 2018-09-03 ENCOUNTER — Encounter (HOSPITAL_COMMUNITY): Payer: Self-pay

## 2018-09-03 ENCOUNTER — Other Ambulatory Visit: Payer: Self-pay

## 2018-09-03 ENCOUNTER — Encounter (HOSPITAL_COMMUNITY): Payer: Self-pay | Admitting: Emergency Medicine

## 2018-09-03 ENCOUNTER — Emergency Department (HOSPITAL_COMMUNITY)
Admission: EM | Admit: 2018-09-03 | Discharge: 2018-09-03 | Disposition: A | Discharge status: Home / Self Care | Payer: Medicare HMO | Source: Home / Self Care | Attending: Emergency Medicine | Admitting: Emergency Medicine

## 2018-09-03 DIAGNOSIS — F17210 Nicotine dependence, cigarettes, uncomplicated: Secondary | ICD-10-CM | POA: Insufficient documentation

## 2018-09-03 DIAGNOSIS — R339 Retention of urine, unspecified: Secondary | ICD-10-CM

## 2018-09-03 DIAGNOSIS — I10 Essential (primary) hypertension: Secondary | ICD-10-CM | POA: Diagnosis not present

## 2018-09-03 DIAGNOSIS — Z466 Encounter for fitting and adjustment of urinary device: Secondary | ICD-10-CM | POA: Diagnosis not present

## 2018-09-03 DIAGNOSIS — Z79899 Other long term (current) drug therapy: Secondary | ICD-10-CM | POA: Diagnosis not present

## 2018-09-03 LAB — URINALYSIS, ROUTINE W REFLEX MICROSCOPIC
Bilirubin Urine: NEGATIVE
Glucose, UA: NEGATIVE mg/dL
Ketones, ur: NEGATIVE mg/dL
Nitrite: NEGATIVE
Protein, ur: NEGATIVE mg/dL
Specific Gravity, Urine: 1.003 — ABNORMAL LOW (ref 1.005–1.030)
pH: 6 (ref 5.0–8.0)

## 2018-09-03 NOTE — ED Notes (Signed)
Pt was able to stand and urinate about 132ml of red urine

## 2018-09-03 NOTE — ED Triage Notes (Addendum)
Patient dropped off by wife.   Patient states urinary catheter is causing him pian and pressure. Patient states catheter has been flowing and is just causing him pressure.    Urinary Cath inserted Tuesday here in ED due to not being able to urinate.  Patient states he wants it out.   Patient states it is suppose to be taken out this Tuesday morning.    A/Ox4 Ambulatory in triage.   Hx. Prostate Cancer.

## 2018-09-03 NOTE — Discharge Instructions (Addendum)
It was our pleasure to provide your ER care today - we hope that you feel better.  Follow up with urologist as planned on Tuesday.  Return to ER right away if worse, new symptoms, unable to void, fevers, severe abdominal or flank pain, other concern.

## 2018-09-03 NOTE — Discharge Instructions (Signed)
Please follow up with your urologist.

## 2018-09-03 NOTE — ED Provider Notes (Signed)
[facility] DEPT Provider Note   CSN: [id number] Arrival date & time: 09/03/18  1940    History   Chief Complaint Chief Complaint  Patient presents with  . Urinary Retention    HPI [name] is a 63 y.o. male past medical history of enlarged prostate, schizophrenia, who presents today for evaluation of abdominal pain.  History was obtained from patient and chart review.  Patient was here earlier this morning and requested that his Foley catheter be removed as it was irritating him.  After that he was able to stand and void.  He reports that since then he has been unable to urinate.  He reports suprapubic pain, however denies any other concerns.   He has an appointment with his urologist on Tuesday.     HPI  Past Medical History:  Diagnosis Date  . ALLERGIC RHINITIS 01/25/2007  . ANXIETY DISORDER, GENERALIZED 01/25/2007  . Arthritis   . DEPRESSION 01/25/2007  . ESOPHAGITIS 12/28/2007  . Fatty liver 10/09/2010  . GERD 01/25/2007  . GLUCOSE INTOLERANCE, HX OF 01/25/2007  . Heart murmur    hx of   . HEMORRHOIDS, INTERNAL, WITH BLEEDING 04/26/2008  . Hiatal hernia   . HYPERLIPIDEMIA 12/27/2007  . HYPERTENSION, ESSENTIAL NOS 01/25/2007  . Hyperthyroidism 08/11/2010  . HYPERTROPHY PROSTATE W/UR OBST & OTH LUTS 02/27/2010  . 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 LOW BACK PAIN, CHRONIC 11/08/2007  . Pancreatitis   . Rectal abscess   . 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 SCOLIOSIS NEC 01/25/2007  . Sebaceous cyst 08/10/2010  . SMOKER 08/05/2009  . UPPER GASTROINTESTINAL HEMORRHAGE 01/25/2007    Patient Active Problem List   Diagnosis Date Noted  . Cervical radiculopathy at C8 01/18/2018  . Right lateral epicondylitis 12/21/2017  . Pain in joint, shoulder region 04/23/2015  . Periumbilical abdominal pain 02/13/2015  . Elevated PSA 08/04/2014  . Subacromial bursitis 04/11/2014  . Neck pain on right side 03/05/2014  . Bilateral shoulder pain  03/05/2014  . Recurrent boils 03/05/2014  . Foreign body in stomach 12/31/2013  . Reflux esophagitis 12/31/2013  . Weight loss 05/06/2012  . Incisional hernia 11/23/2011  . Cramp of limb 01/13/2011  . Hyperthyroidism 08/11/2010  . Sebaceous cyst 08/10/2010  . HYPERTROPHY PROSTATE W/UR OBST & OTH LUTS 02/27/2010  . SMOKER 08/05/2009  . 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 HEMORRHOIDS, INTERNAL, WITH BLEEDING 04/26/2008  . Hyperlipidemia 12/27/2007  . LOW BACK PAIN, CHRONIC 11/08/2007  . Schizophrenia ([place]) 01/25/2007  . ANXIETY DISORDER, GENERALIZED 01/25/2007  . DEPRESSION 01/25/2007  . Essential hypertension 01/25/2007  . ALLERGIC RHINITIS 01/25/2007  . SCOLIOSIS NEC 01/25/2007  . Impaired glucose tolerance 01/25/2007    Past Surgical History:  Procedure Laterality Date  . ABDOMINAL EXPLORATION SURGERY  1988  . CHOLECYSTECTOMY  05/20/08   Dr. Zella Richer  . COLONOSCOPY  04/23/2010   normal  . ESOPHAGOGASTRODUODENOSCOPY N/A 12/31/2013   Procedure: ESOPHAGOGASTRODUODENOSCOPY (EGD);  Surgeon: Gatha Mayer, MD;  Location: [facility];  Service: Endoscopy;  Laterality: N/A;  . Excision of scalp lesion  07/2009  . Excision of thyroid mass    . GANGLION CYST EXCISION     right foot x 2  . HERNIA REPAIR     ventral  . INCISIONAL HERNIA REPAIR  12/01/2011   Procedure: LAPAROSCOPIC INCISIONAL HERNIA;  Surgeon: Harl Bowie, MD;  Location: [facility];  Service: General;  Laterality: N/A;  . SHOULDER SURGERY     right  Home Medications    Prior to Admission medications   Medication Sig Start Date End Date Taking? Authorizing Provider  acetaminophen (TYLENOL) 500 MG tablet Take 1,000 mg by mouth every 6 (six) hours as needed for moderate pain.     [provider]  amLODipine (NORVASC) 10 MG tablet Take 1 tablet (10 mg total) by mouth daily. 05/15/18   Marrian Salvage, FNP  Blood Pressure Monitoring (BLOOD PRESSURE CUFF) MISC Use daily as directed to  check blood pressure 06/01/18   Marrian Salvage, FNP  cephALEXin (KEFLEX) 500 MG capsule Take 1 capsule (500 mg total) by mouth 2 (two) times daily. 08/30/18   Montine Circle, PA-C  cyclobenzaprine (FLEXERIL) 10 MG tablet TAKE 1 TABLET BY MOUTH EVERYDAY AT BEDTIME 07/18/18   Marrian Salvage, FNP  fluPHENAZine (PROLIXIN) 10 MG tablet Take 2 tablets (20 mg total) by mouth daily. 10/19/17   Marrian Salvage, FNP  fluticasone (FLONASE) 50 MCG/ACT nasal spray Place 2 sprays into both nostrils daily. Patient taking differently: Place 2 sprays into both nostrils daily as needed for allergies.  12/23/17   Marrian Salvage, FNP  gabapentin (NEURONTIN) 300 MG capsule TAKE 1 CAPSULE BY MOUTH EVERYDAY AT BEDTIME 07/13/18   Hulan Saas M, DO  KLOR-CON M20 20 MEQ tablet Take 1 tablet (20 mEq total) by mouth daily. 10/19/17   Marrian Salvage, FNP  losartan (COZAAR) 100 MG tablet Take 1 tablet (100 mg total) by mouth daily. 02/01/18   Marrian Salvage, FNP  naproxen sodium (ALEVE) 220 MG tablet Take 440 mg by mouth daily as needed (pain).    [provider]  pantoprazole (PROTONIX) 40 MG tablet Take 1 tablet (40 mg total) by mouth daily. 10/19/17   Marrian Salvage, FNP  rosuvastatin (CRESTOR) 10 MG tablet Take 1 tablet (10 mg total) by mouth daily. 04/24/18   Marrian Salvage, FNP  rosuvastatin (CRESTOR) 10 MG tablet Take 1 tablet (10 mg total) by mouth daily. 04/24/18   Marrian Salvage, FNP    Family History Family History  Problem Relation Age of Onset  . Hypertension Father   . Hypertension Mother   . Prostate cancer Other   . Colon cancer Neg Hx     Social History Social History   Tobacco Use  . Smoking status: Current Every Day Smoker    Packs/day: 1.50    Years: 30.00    Pack years: 45.00    Types: Cigarettes  . Smokeless tobacco: Never Used  Substance Use Topics  . Alcohol use: No  . Drug use: Yes    Frequency: 14.0 times per week     Types: Marijuana     Allergies   Celecoxib; Iohexol; Metoclopramide hcl; and Wellbutrin [bupropion]   Review of Systems Review of Systems  Gastrointestinal: Positive for abdominal pain.  Genitourinary: Positive for decreased urine volume and difficulty urinating. Negative for discharge, penile pain and penile swelling.  Musculoskeletal: Negative for back pain.  Neurological: Negative for weakness.  All other systems reviewed and are negative.    Physical Exam Updated Vital Signs BP (!) 123/96 (BP Location: Left Arm)   Pulse (!) 116   Temp 98 F (36.7 C) (Oral)   Resp 18   Ht 5\' 9"  (1.753 m)   Wt 104.3 kg   SpO2 95%   BMI 33.97 kg/m   Physical Exam Vitals signs and nursing note reviewed.  Constitutional:      General: He is not in acute  distress.    Appearance: He is well-developed. He is not diaphoretic.  HENT:     Head: Normocephalic and atraumatic.     Mouth/Throat:     Mouth: Mucous membranes are moist.  Eyes:     General: No scleral icterus.       Right eye: No discharge.        Left eye: No discharge.     Conjunctiva/sclera: Conjunctivae normal.  Neck:     Musculoskeletal: Normal range of motion.  Cardiovascular:     Rate and Rhythm: Normal rate and regular rhythm.  Pulmonary:     Effort: Pulmonary effort is normal. No respiratory distress.     Breath sounds: No stridor.  Abdominal:     General: There is no distension.     Tenderness: There is no abdominal tenderness.  Genitourinary:    Comments: Deferred.  Musculoskeletal:        General: No deformity.  Skin:    General: Skin is warm and dry.  Neurological:     Mental Status: He is alert.     Motor: No abnormal muscle tone.  Psychiatric:        Mood and Affect: Mood normal.        Behavior: Behavior normal.      ED Treatments / Results  Labs (all labs ordered are listed, but only abnormal results are displayed) Labs Reviewed  URINALYSIS, ROUTINE W REFLEX MICROSCOPIC - Abnormal;  Notable for the following components:      Result Value   Specific Gravity, Urine 1.003 (*)    Hgb urine dipstick MODERATE (*)    Leukocytes,Ua TRACE (*)    Bacteria, UA RARE (*)    All other components within normal limits  URINE CULTURE    EKG None  Radiology No results found.  Procedures Procedures (including critical care time)  Medications Ordered in ED Medications - No data to display   Initial Impression / Assessment and Plan / ED Course  I have reviewed the triage vital signs and the nursing notes.  Pertinent labs & imaging results that were available during my care of the patient were reviewed by me and considered in my medical decision making (see chart for details).       Patient presents today for evaluation of inability to urinate.  He was seen here this morning after he presented requesting that his Foley catheter be removed.  He has been unable to urinate since he went home.  Bladder scan was obtained showing that he had over 500 mL of urine.  Foley catheter was placed and at the time of my evaluation he had put out a 900 mL of urine.  He reports that his abdominal pain had fully resolved and he feels back to baseline.  He denies any pain at this time and is requesting discharge home.  UA obtained showing rare bacteria, trace leukocytes and moderate blood.  He is on antibiotics already and blood is consistent with his recent catheter removal and insertion.  Was seen as a shared visit with Dr. Vanita Panda.  Of note while in room patient's heart rate was 98.    Return precautions were discussed with patient who states their understanding.  At the time of discharge patient denied any unaddressed complaints or concerns.  Patient is agreeable for discharge home.   Final Clinical Impressions(s) / ED Diagnoses   Final diagnoses:  Urinary retention    ED Discharge Orders    None  Ollen Gross 09/03/18 2110    Carmin Muskrat, MD 09/07/18  [phone]

## 2018-09-03 NOTE — ED Provider Notes (Signed)
[facility] DEPT Provider Note   CSN: [id number] Arrival date & time: 09/03/18  8676    History   Chief Complaint Chief Complaint  Patient presents with  . Urinary Cath issues    HPI [name] is a 63 y.o. male.     Patient requests foley catheter be removed. Patient presented 5/13 with urinary retention, indicates he has an enlarged prostate. States that was his first problem w retention, first catheter. Has plan to see urology this Tuesday, but states catheter is aggravating him and he requests it be removed. Patient denies fever or chills. States seems to be making normal amount of urine, foley draining into leg bag. Denies abd or flank pain. No nvd. No scrotal or testicular pain. States otherwise feels health at baseline.   The history is provided by the patient.    Past Medical History:  Diagnosis Date  . ALLERGIC RHINITIS 01/25/2007  . ANXIETY DISORDER, GENERALIZED 01/25/2007  . Arthritis   . DEPRESSION 01/25/2007  . ESOPHAGITIS 12/28/2007  . Fatty liver 10/09/2010  . GERD 01/25/2007  . GLUCOSE INTOLERANCE, HX OF 01/25/2007  . Heart murmur    hx of   . HEMORRHOIDS, INTERNAL, WITH BLEEDING 04/26/2008  . Hiatal hernia   . HYPERLIPIDEMIA 12/27/2007  . HYPERTENSION, ESSENTIAL NOS 01/25/2007  . Hyperthyroidism 08/11/2010  . HYPERTROPHY PROSTATE W/UR OBST & OTH LUTS 02/27/2010  . 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 LOW BACK PAIN, CHRONIC 11/08/2007  . Pancreatitis   . Rectal abscess   . 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 SCOLIOSIS NEC 01/25/2007  . Sebaceous cyst 08/10/2010  . SMOKER 08/05/2009  . UPPER GASTROINTESTINAL HEMORRHAGE 01/25/2007    Patient Active Problem List   Diagnosis Date Noted  . Cervical radiculopathy at C8 01/18/2018  . Right lateral epicondylitis 12/21/2017  . Pain in joint, shoulder region 04/23/2015  . Periumbilical abdominal pain 02/13/2015  . Elevated PSA 08/04/2014  . Subacromial bursitis 04/11/2014  .  Neck pain on right side 03/05/2014  . Bilateral shoulder pain 03/05/2014  . Recurrent boils 03/05/2014  . Foreign body in stomach 12/31/2013  . Reflux esophagitis 12/31/2013  . Weight loss 05/06/2012  . Incisional hernia 11/23/2011  . Cramp of limb 01/13/2011  . Hyperthyroidism 08/11/2010  . Sebaceous cyst 08/10/2010  . HYPERTROPHY PROSTATE W/UR OBST & OTH LUTS 02/27/2010  . SMOKER 08/05/2009  . 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 HEMORRHOIDS, INTERNAL, WITH BLEEDING 04/26/2008  . Hyperlipidemia 12/27/2007  . LOW BACK PAIN, CHRONIC 11/08/2007  . Schizophrenia ([place]) 01/25/2007  . ANXIETY DISORDER, GENERALIZED 01/25/2007  . DEPRESSION 01/25/2007  . Essential hypertension 01/25/2007  . ALLERGIC RHINITIS 01/25/2007  . SCOLIOSIS NEC 01/25/2007  . Impaired glucose tolerance 01/25/2007    Past Surgical History:  Procedure Laterality Date  . ABDOMINAL EXPLORATION SURGERY  1988  . CHOLECYSTECTOMY  05/20/08   Dr. Zella Richer  . COLONOSCOPY  04/23/2010   normal  . ESOPHAGOGASTRODUODENOSCOPY N/A 12/31/2013   Procedure: ESOPHAGOGASTRODUODENOSCOPY (EGD);  Surgeon: Gatha Mayer, MD;  Location: [facility];  Service: Endoscopy;  Laterality: N/A;  . Excision of scalp lesion  07/2009  . Excision of thyroid mass    . GANGLION CYST EXCISION     right foot x 2  . HERNIA REPAIR     ventral  . INCISIONAL HERNIA REPAIR  12/01/2011   Procedure: LAPAROSCOPIC INCISIONAL HERNIA;  Surgeon: Harl Bowie, MD;  Location: [facility];  Service: General;  Laterality: N/A;  . SHOULDER SURGERY  right        Home Medications    Prior to Admission medications   Medication Sig Start Date End Date Taking? Authorizing Provider  acetaminophen (TYLENOL) 500 MG tablet Take 1,000 mg by mouth every 6 (six) hours as needed for moderate pain.     [provider]  amLODipine (NORVASC) 10 MG tablet Take 1 tablet (10 mg total) by mouth daily. 05/15/18   Marrian Salvage, FNP  Blood Pressure  Monitoring (BLOOD PRESSURE CUFF) MISC Use daily as directed to check blood pressure 06/01/18   Marrian Salvage, FNP  cephALEXin (KEFLEX) 500 MG capsule Take 1 capsule (500 mg total) by mouth 2 (two) times daily. 08/30/18   Montine Circle, PA-C  cyclobenzaprine (FLEXERIL) 10 MG tablet TAKE 1 TABLET BY MOUTH EVERYDAY AT BEDTIME 07/18/18   Marrian Salvage, FNP  fluPHENAZine (PROLIXIN) 10 MG tablet Take 2 tablets (20 mg total) by mouth daily. 10/19/17   Marrian Salvage, FNP  fluticasone (FLONASE) 50 MCG/ACT nasal spray Place 2 sprays into both nostrils daily. Patient taking differently: Place 2 sprays into both nostrils daily as needed for allergies.  12/23/17   Marrian Salvage, FNP  gabapentin (NEURONTIN) 300 MG capsule TAKE 1 CAPSULE BY MOUTH EVERYDAY AT BEDTIME 07/13/18   Hulan Saas M, DO  KLOR-CON M20 20 MEQ tablet Take 1 tablet (20 mEq total) by mouth daily. 10/19/17   Marrian Salvage, FNP  losartan (COZAAR) 100 MG tablet Take 1 tablet (100 mg total) by mouth daily. 02/01/18   Marrian Salvage, FNP  naproxen sodium (ALEVE) 220 MG tablet Take 440 mg by mouth daily as needed (pain).    [provider]  pantoprazole (PROTONIX) 40 MG tablet Take 1 tablet (40 mg total) by mouth daily. 10/19/17   Marrian Salvage, FNP  rosuvastatin (CRESTOR) 10 MG tablet Take 1 tablet (10 mg total) by mouth daily. 04/24/18   Marrian Salvage, FNP  rosuvastatin (CRESTOR) 10 MG tablet Take 1 tablet (10 mg total) by mouth daily. 04/24/18   Marrian Salvage, FNP    Family History Family History  Problem Relation Age of Onset  . Hypertension Father   . Hypertension Mother   . Prostate cancer Other   . Colon cancer Neg Hx     Social History Social History   Tobacco Use  . Smoking status: Current Every Day Smoker    Packs/day: 1.50    Years: 30.00    Pack years: 45.00    Types: Cigarettes  . Smokeless tobacco: Never Used  Substance Use Topics  . Alcohol  use: No  . Drug use: Yes    Frequency: 14.0 times per week    Types: Marijuana     Allergies   Celecoxib; Iohexol; Metoclopramide hcl; and Wellbutrin [bupropion]   Review of Systems Review of Systems  Constitutional: Negative for chills and fever.  HENT: Negative for sore throat.   Eyes: Negative for redness.  Respiratory: Negative for cough and shortness of breath.   Cardiovascular: Negative for chest pain.  Gastrointestinal: Negative for abdominal pain, diarrhea and vomiting.  Genitourinary: Negative for flank pain, scrotal swelling and testicular pain.  Musculoskeletal: Negative for back pain and neck pain.  Skin: Negative for rash.  Neurological: Negative for numbness and headaches.  Hematological: Does not bruise/bleed easily.  Psychiatric/Behavioral: Negative for confusion.     Physical Exam Updated Vital Signs BP 139/85 (BP Location: Right Arm)   Pulse (!) 117   Temp 98.8  F (37.1 C) (Oral)   Resp 18   Ht 1.753 m (5\' 9" )   Wt 104.3 kg   SpO2 100%   BMI 33.97 kg/m   Physical Exam Vitals signs and nursing note reviewed.  Constitutional:      Appearance: Normal appearance. He is well-developed.  HENT:     Head: Atraumatic.     Nose: Nose normal.     Mouth/Throat:     Mouth: Mucous membranes are moist.     Pharynx: Oropharynx is clear.  Eyes:     General: No scleral icterus.    Conjunctiva/sclera: Conjunctivae normal.  Neck:     Musculoskeletal: Normal range of motion.     Trachea: No tracheal deviation.  Cardiovascular:     Pulses: Normal pulses.  Pulmonary:     Effort: Pulmonary effort is normal. No accessory muscle usage or respiratory distress.  Abdominal:     General: Bowel sounds are normal. There is no distension.     Palpations: Abdomen is soft. There is no mass.     Tenderness: There is no abdominal tenderness. There is no guarding.  Genitourinary:    Comments: No cva tenderness. Normal external gu exam. No scrotal or testicular swelling  or tenderness. Foley catheter in place. No sign of infection.  Musculoskeletal:        General: No swelling.     Right lower leg: No edema.     Left lower leg: No edema.  Skin:    General: Skin is warm and dry.     Findings: No rash.  Neurological:     Mental Status: He is alert.     Comments: Alert, speech clear. Steady gait.   Psychiatric:        Mood and Affect: Mood normal.      ED Treatments / Results  Labs (all labs ordered are listed, but only abnormal results are displayed) Labs Reviewed - No data to display  EKG None  Radiology No results found.  Procedures Procedures (including critical care time)  Medications Ordered in ED Medications - No data to display   Initial Impression / Assessment and Plan / ED Course  I have reviewed the triage vital signs and the nursing notes.  Pertinent labs & imaging results that were available during my care of the patient were reviewed by me and considered in my medical decision making (see chart for details).  Patient requests foley be removed. I discussed that if we remove, he may experience recurrent urine retention/inability to void, necessitating new catheter. Pt indicates he understands, and persists in requesting foley be removed.   Reviewed nursing notes and prior charts for additional history. Prior urine culture negative.   Pt afebrile.   Currently, hr 88, rr 16. bp normal.   Foley removed by staff.  Return precautions provided.   Pt to keep his urology f/u for Tuesday.     Final Clinical Impressions(s) / ED Diagnoses   Final diagnoses:  None    ED Discharge Orders    None       Lajean Saver, MD 09/03/18 [phone]

## 2018-09-03 NOTE — ED Notes (Signed)
Removed urinary catheter, small amount of blood noted

## 2018-09-03 NOTE — ED Triage Notes (Signed)
Patient complaining he can not urinate. Patient was in here earlier and had catheter taken out. Patient has appt to see urologist Tuesday.

## 2018-09-04 LAB — URINE CULTURE
Culture: NO GROWTH
Special Requests: NORMAL

## 2018-09-09 ENCOUNTER — Other Ambulatory Visit: Payer: Self-pay | Admitting: Family

## 2018-09-10 ENCOUNTER — Other Ambulatory Visit: Payer: Self-pay

## 2018-09-10 ENCOUNTER — Encounter (HOSPITAL_COMMUNITY): Payer: Self-pay | Admitting: Emergency Medicine

## 2018-09-10 ENCOUNTER — Emergency Department (HOSPITAL_COMMUNITY)
Admission: EM | Admit: 2018-09-10 | Discharge: 2018-09-10 | Disposition: A | Discharge status: Home / Self Care | Payer: Medicare HMO | Attending: Emergency Medicine | Admitting: Emergency Medicine

## 2018-09-10 DIAGNOSIS — Y733 Surgical instruments, materials and gastroenterology and urology devices (including sutures) associated with adverse incidents: Secondary | ICD-10-CM | POA: Diagnosis not present

## 2018-09-10 DIAGNOSIS — F17210 Nicotine dependence, cigarettes, uncomplicated: Secondary | ICD-10-CM | POA: Diagnosis not present

## 2018-09-10 DIAGNOSIS — I10 Essential (primary) hypertension: Secondary | ICD-10-CM | POA: Diagnosis not present

## 2018-09-10 DIAGNOSIS — T839XXA Unspecified complication of genitourinary prosthetic device, implant and graft, initial encounter: Secondary | ICD-10-CM | POA: Insufficient documentation

## 2018-09-10 DIAGNOSIS — Z79899 Other long term (current) drug therapy: Secondary | ICD-10-CM | POA: Diagnosis not present

## 2018-09-10 DIAGNOSIS — Z466 Encounter for fitting and adjustment of urinary device: Secondary | ICD-10-CM | POA: Diagnosis present

## 2018-09-10 LAB — URINALYSIS, ROUTINE W REFLEX MICROSCOPIC
Bilirubin Urine: NEGATIVE
Glucose, UA: NEGATIVE mg/dL
Ketones, ur: NEGATIVE mg/dL
Nitrite: NEGATIVE
Protein, ur: 30 mg/dL — AB
RBC / HPF: >50 RBC/hpf — ABNORMAL HIGH (ref 0–5)
Specific Gravity, Urine: 1.004 — ABNORMAL LOW (ref 1.005–1.030)
pH: 8 (ref 5.0–8.0)

## 2018-09-10 MED ORDER — ONDANSETRON 4 MG PO TBDP
4.0000 mg | ORAL_TABLET | Freq: Once | ORAL | Status: AC
  Administered 2018-09-10: 4 mg via ORAL
  Filled 2018-09-10: qty 1

## 2018-09-10 NOTE — ED Provider Notes (Signed)
[facility] DEPT Provider Note   CSN: [id number] Arrival date & time: 09/10/18  0006    History   Chief Complaint Chief Complaint  Patient presents with  . catheter leaking    HPI [name] is a 63 y.o. male.     HPI  This is a 63 year old male with a history of an enlarged prostate, schizophrenia who presents with leaking of his Foley catheter.  Patient was seen and evaluated on May 17.  At that time he had had his catheter removed but it had to be replaced for retention.  Patient reports that today he has had leaking around the catheter and noted bloody urine.  He feels that he needs his catheter removed.  He states "I know I cannot pee."  He is scheduled to have catheter removal later this week.  He denies any back pain, fevers, abdominal pain, nausea, vomiting.  Past Medical History:  Diagnosis Date  . ALLERGIC RHINITIS 01/25/2007  . ANXIETY DISORDER, GENERALIZED 01/25/2007  . Arthritis   . DEPRESSION 01/25/2007  . ESOPHAGITIS 12/28/2007  . Fatty liver 10/09/2010  . GERD 01/25/2007  . GLUCOSE INTOLERANCE, HX OF 01/25/2007  . Heart murmur    hx of   . HEMORRHOIDS, INTERNAL, WITH BLEEDING 04/26/2008  . Hiatal hernia   . HYPERLIPIDEMIA 12/27/2007  . HYPERTENSION, ESSENTIAL NOS 01/25/2007  . Hyperthyroidism 08/11/2010  . HYPERTROPHY PROSTATE W/UR OBST & OTH LUTS 02/27/2010  . 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 LOW BACK PAIN, CHRONIC 11/08/2007  . Pancreatitis   . Rectal abscess   . 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 SCOLIOSIS NEC 01/25/2007  . Sebaceous cyst 08/10/2010  . SMOKER 08/05/2009  . UPPER GASTROINTESTINAL HEMORRHAGE 01/25/2007    Patient Active Problem List   Diagnosis Date Noted  . Cervical radiculopathy at C8 01/18/2018  . Right lateral epicondylitis 12/21/2017  . Pain in joint, shoulder region 04/23/2015  . Periumbilical abdominal pain 02/13/2015  . Elevated PSA 08/04/2014  . Subacromial bursitis 04/11/2014  . Neck  pain on right side 03/05/2014  . Bilateral shoulder pain 03/05/2014  . Recurrent boils 03/05/2014  . Foreign body in stomach 12/31/2013  . Reflux esophagitis 12/31/2013  . Weight loss 05/06/2012  . Incisional hernia 11/23/2011  . Cramp of limb 01/13/2011  . Hyperthyroidism 08/11/2010  . Sebaceous cyst 08/10/2010  . HYPERTROPHY PROSTATE W/UR OBST & OTH LUTS 02/27/2010  . SMOKER 08/05/2009  . 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 HEMORRHOIDS, INTERNAL, WITH BLEEDING 04/26/2008  . Hyperlipidemia 12/27/2007  . LOW BACK PAIN, CHRONIC 11/08/2007  . Schizophrenia ([place]) 01/25/2007  . ANXIETY DISORDER, GENERALIZED 01/25/2007  . DEPRESSION 01/25/2007  . Essential hypertension 01/25/2007  . ALLERGIC RHINITIS 01/25/2007  . SCOLIOSIS NEC 01/25/2007  . Impaired glucose tolerance 01/25/2007    Past Surgical History:  Procedure Laterality Date  . ABDOMINAL EXPLORATION SURGERY  1988  . CHOLECYSTECTOMY  05/20/08   Dr. Zella Richer  . COLONOSCOPY  04/23/2010   normal  . ESOPHAGOGASTRODUODENOSCOPY N/A 12/31/2013   Procedure: ESOPHAGOGASTRODUODENOSCOPY (EGD);  Surgeon: Gatha Mayer, MD;  Location: [facility];  Service: Endoscopy;  Laterality: N/A;  . Excision of scalp lesion  07/2009  . Excision of thyroid mass    . GANGLION CYST EXCISION     right foot x 2  . HERNIA REPAIR     ventral  . INCISIONAL HERNIA REPAIR  12/01/2011   Procedure: LAPAROSCOPIC INCISIONAL HERNIA;  Surgeon: Harl Bowie, MD;  Location: [facility];  Service: General;  Laterality: N/A;  . SHOULDER SURGERY     right        Home Medications    Prior to Admission medications   Medication Sig Start Date End Date Taking? Authorizing Provider  acetaminophen (TYLENOL) 500 MG tablet Take 1,000 mg by mouth every 6 (six) hours as needed for moderate pain.     [provider]  amLODipine (NORVASC) 10 MG tablet Take 1 tablet (10 mg total) by mouth daily. 05/15/18   Marrian Salvage, FNP  Blood Pressure  Monitoring (BLOOD PRESSURE CUFF) MISC Use daily as directed to check blood pressure 06/01/18   Marrian Salvage, FNP  cephALEXin (KEFLEX) 500 MG capsule Take 1 capsule (500 mg total) by mouth 2 (two) times daily. 08/30/18   Montine Circle, PA-C  cyclobenzaprine (FLEXERIL) 10 MG tablet TAKE 1 TABLET BY MOUTH EVERYDAY AT BEDTIME 07/18/18   Marrian Salvage, FNP  fluPHENAZine (PROLIXIN) 10 MG tablet Take 2 tablets (20 mg total) by mouth daily. 10/19/17   Marrian Salvage, FNP  fluticasone (FLONASE) 50 MCG/ACT nasal spray Place 2 sprays into both nostrils daily. Patient taking differently: Place 2 sprays into both nostrils daily as needed for allergies.  12/23/17   Marrian Salvage, FNP  gabapentin (NEURONTIN) 300 MG capsule TAKE 1 CAPSULE BY MOUTH EVERYDAY AT BEDTIME 07/13/18   Hulan Saas M, DO  KLOR-CON M20 20 MEQ tablet Take 1 tablet (20 mEq total) by mouth daily. 10/19/17   Marrian Salvage, FNP  losartan (COZAAR) 100 MG tablet Take 1 tablet (100 mg total) by mouth daily. 02/01/18   Marrian Salvage, FNP  naproxen sodium (ALEVE) 220 MG tablet Take 440 mg by mouth daily as needed (pain).    [provider]  pantoprazole (PROTONIX) 40 MG tablet Take 1 tablet (40 mg total) by mouth daily. 10/19/17   Marrian Salvage, FNP  rosuvastatin (CRESTOR) 10 MG tablet Take 1 tablet (10 mg total) by mouth daily. 04/24/18   Marrian Salvage, FNP  rosuvastatin (CRESTOR) 10 MG tablet Take 1 tablet (10 mg total) by mouth daily. 04/24/18   Marrian Salvage, FNP    Family History Family History  Problem Relation Age of Onset  . Hypertension Father   . Hypertension Mother   . Prostate cancer Other   . Colon cancer Neg Hx     Social History Social History   Tobacco Use  . Smoking status: Current Every Day Smoker    Packs/day: 1.50    Years: 30.00    Pack years: 45.00    Types: Cigarettes  . Smokeless tobacco: Never Used  Substance Use Topics  . Alcohol  use: No  . Drug use: Yes    Frequency: 14.0 times per week    Types: Marijuana     Allergies   Celecoxib; Iohexol; Metoclopramide hcl; and Wellbutrin [bupropion]   Review of Systems Review of Systems  Constitutional: Negative for fever.  Respiratory: Negative for shortness of breath.   Cardiovascular: Negative for chest pain.  Gastrointestinal: Negative for abdominal pain.  Genitourinary: Positive for hematuria. Negative for dysuria and flank pain.       Catheter leakage  All other systems reviewed and are negative.    Physical Exam Updated Vital Signs BP 139/82 (BP Location: Left Arm)   Pulse (!) 112   Temp 98.6 F (37 C) (Oral)   Resp 18   Ht 1.753 m (5\' 9" )   Wt 103.4 kg   SpO2  99%   BMI 33.67 kg/m   Physical Exam Vitals signs and nursing note reviewed.  Constitutional:      Appearance: He is well-developed. He is not ill-appearing.  HENT:     Head: Normocephalic and atraumatic.  Neck:     Musculoskeletal: Neck supple.  Cardiovascular:     Rate and Rhythm: Normal rate and regular rhythm.  Pulmonary:     Effort: Pulmonary effort is normal.     Breath sounds: Normal breath sounds.  Abdominal:     Palpations: Abdomen is soft.     Tenderness: There is no abdominal tenderness. There is no guarding or rebound.  Genitourinary:    Comments: Urinary catheter in place, pink-tinged urine in Foley catheter bag. Lymphadenopathy:     Cervical: No cervical adenopathy.  Skin:    General: Skin is warm and dry.  Neurological:     Mental Status: He is alert and oriented to person, place, and time.  Psychiatric:        Mood and Affect: Mood normal.      ED Treatments / Results  Labs (all labs ordered are listed, but only abnormal results are displayed) Labs Reviewed  URINALYSIS, ROUTINE W REFLEX MICROSCOPIC - Abnormal; Notable for the following components:      Result Value   Color, Urine RED (*)    APPearance CLOUDY (*)    Specific Gravity, Urine 1.004 (*)     Hgb urine dipstick LARGE (*)    Protein, ur 30 (*)    Leukocytes,Ua LARGE (*)    RBC / HPF >50 (*)    Bacteria, UA RARE (*)    All other components within normal limits  URINE CULTURE    EKG None  Radiology No results found.  Procedures Procedures (including critical care time)  Medications Ordered in ED Medications  ondansetron (ZOFRAN-ODT) disintegrating tablet 4 mg (4 mg Oral Given 09/10/18 0246)     Initial Impression / Assessment and Plan / ED Course  I have reviewed the triage vital signs and the nursing notes.  Pertinent labs & imaging results that were available during my care of the patient were reviewed by me and considered in my medical decision making (see chart for details).        Patient presents with concerns for leaking of his Foley catheter.  He is requesting removal.  I discussed with him that last time he had this removed he developed acute urinary retention.  He is adamant to have the Foley catheter removed.  Foley was removed.  He was able to spontaneously void.  There was gross hematuria and 21-50 white cells.  Patient is systemically well-appearing and no symptoms of UTI.  Urine culture was sent.  Recommend close follow-up with urology.  After history, exam, and medical workup I feel the patient has been appropriately medically screened and is safe for discharge home. Pertinent diagnoses were discussed with the patient. Patient was given return precautions.   Final Clinical Impressions(s) / ED Diagnoses   Final diagnoses:  Problem with Foley catheter, initial encounter [facility])    ED Discharge Orders    None       Dina Rich, Barbette Hair, MD 09/10/18 [phone]

## 2018-09-10 NOTE — ED Triage Notes (Signed)
Pt reports that he is having leaking around foley catheter. Pt reports that the catheter was placed last Sunday.

## 2018-09-10 NOTE — ED Notes (Signed)
Pt able to urinate on his own without the catheter inserted. There was about 150 mL output.

## 2018-09-10 NOTE — Discharge Instructions (Addendum)
You were seen today and had your Foley catheter removed.  Follow-up closely with your urologist.  If you develop difficulty urinating or any new or worsening symptoms you should be reevaluated.

## 2018-09-11 LAB — URINE CULTURE: Culture: <10000 — AB

## 2018-10-18 ENCOUNTER — Other Ambulatory Visit: Payer: Self-pay | Admitting: Family

## 2018-10-18 ENCOUNTER — Telehealth: Payer: Self-pay

## 2018-10-18 DIAGNOSIS — I10 Essential (primary) hypertension: Secondary | ICD-10-CM

## 2018-10-18 NOTE — Telephone Encounter (Signed)
Copied from [place] [id number]. Topic: General - Other >> Oct 17, 2018  4:35 PM Mcneil, Ja-Kwan wrote: Reason for CRM: Pt wife stated pt can get a free blood pressure cuff from [facility] but he would need a Rx from pcp. Pt wife requests Rx for bp cuff and would like a call back once the Rx is ready for pick up.

## 2018-10-18 NOTE — Telephone Encounter (Signed)
Will put on your desk; thanks-

## 2018-10-18 NOTE — Telephone Encounter (Signed)
Spoke with patient and info given. He asked that I mail it out so order placed to go out today.

## 2018-10-23 ENCOUNTER — Telehealth: Payer: Self-pay | Admitting: Family

## 2018-10-23 ENCOUNTER — Other Ambulatory Visit: Payer: Self-pay | Admitting: Family

## 2018-10-23 MED ORDER — CYCLOBENZAPRINE HCL 10 MG PO TABS: ORAL_TABLET | ORAL | 0 refills | Status: DC

## 2018-10-23 NOTE — Telephone Encounter (Signed)
Medication Refill - Medication: cyclobenzaprine (FLEXERIL) 10 MG tablet Albuterol inhaler  Has the patient contacted their pharmacy? Yes.   (Agent: If no, request that the patient contact the pharmacy for the refill.) (Agent: If yes, when and what did the pharmacy advise?)  Preferred Pharmacy (with phone number or street name): [id number] - [place], [place]  Agent: Please be advised that RX refills may take up to 3 business days. We ask that you follow-up with your pharmacy.

## 2018-10-24 ENCOUNTER — Other Ambulatory Visit: Payer: Self-pay

## 2018-10-24 ENCOUNTER — Encounter: Payer: Self-pay | Admitting: Family

## 2018-10-24 ENCOUNTER — Ambulatory Visit (INDEPENDENT_AMBULATORY_CARE_PROVIDER_SITE_OTHER): Payer: Medicare HMO | Admitting: Family

## 2018-10-24 ENCOUNTER — Other Ambulatory Visit (INDEPENDENT_AMBULATORY_CARE_PROVIDER_SITE_OTHER): Payer: Medicare HMO

## 2018-10-24 VITALS — BP 130/82 | HR 99 | Temp 98.5°F | Ht 69.0 in | Wt 224.0 lb

## 2018-10-24 DIAGNOSIS — F209 Schizophrenia, unspecified: Secondary | ICD-10-CM

## 2018-10-24 DIAGNOSIS — Z720 Tobacco use: Secondary | ICD-10-CM

## 2018-10-24 DIAGNOSIS — K439 Ventral hernia without obstruction or gangrene: Secondary | ICD-10-CM

## 2018-10-24 DIAGNOSIS — K219 Gastro-esophageal reflux disease without esophagitis: Secondary | ICD-10-CM | POA: Diagnosis not present

## 2018-10-24 DIAGNOSIS — R972 Elevated prostate specific antigen [PSA]: Secondary | ICD-10-CM

## 2018-10-24 DIAGNOSIS — J449 Chronic obstructive pulmonary disease, unspecified: Secondary | ICD-10-CM

## 2018-10-24 DIAGNOSIS — E782 Mixed hyperlipidemia: Secondary | ICD-10-CM

## 2018-10-24 DIAGNOSIS — I10 Essential (primary) hypertension: Secondary | ICD-10-CM

## 2018-10-24 LAB — CBC WITH DIFFERENTIAL/PLATELET
Basophils Absolute: 0.1 10*3/uL (ref 0.0–0.1)
Basophils Relative: 1.5 % (ref 0.0–3.0)
Eosinophils Absolute: 0.1 10*3/uL (ref 0.0–0.7)
Eosinophils Relative: 1.3 % (ref 0.0–5.0)
HCT: 43.2 % (ref 39.0–52.0)
Hemoglobin: 14.6 g/dL (ref 13.0–17.0)
Lymphocytes Relative: 30.1 % (ref 12.0–46.0)
Lymphs Abs: 2.4 10*3/uL (ref 0.7–4.0)
MCHC: 33.7 g/dL (ref 30.0–36.0)
MCV: 87.5 fl (ref 78.0–100.0)
Monocytes Absolute: 0.7 10*3/uL (ref 0.1–1.0)
Monocytes Relative: 8.6 % (ref 3.0–12.0)
Neutro Abs: 4.7 10*3/uL (ref 1.4–7.7)
Neutrophils Relative %: 58.5 % (ref 43.0–77.0)
Platelets: 352 10*3/uL (ref 150.0–400.0)
RBC: 4.94 Mil/uL (ref 4.22–5.81)
RDW: 15.8 % — ABNORMAL HIGH (ref 11.5–15.5)
WBC: 8.1 10*3/uL (ref 4.0–10.5)

## 2018-10-24 LAB — COMPREHENSIVE METABOLIC PANEL
ALT: 9 U/L (ref 0–53)
AST: 12 U/L (ref 0–37)
Albumin: 4.2 g/dL (ref 3.5–5.2)
Alkaline Phosphatase: 78 U/L (ref 39–117)
BUN: 8 mg/dL (ref 6–23)
CO2: 30 mEq/L (ref 19–32)
Calcium: 9.2 mg/dL (ref 8.4–10.5)
Chloride: 102 mEq/L (ref 96–112)
Creatinine, Ser: 1.48 mg/dL (ref 0.40–1.50)
GFR: 58.35 mL/min — ABNORMAL LOW (ref >60.00)
Glucose, Bld: 79 mg/dL (ref 70–99)
Potassium: 3.8 mEq/L (ref 3.5–5.1)
Sodium: 139 mEq/L (ref 135–145)
Total Bilirubin: 0.4 mg/dL (ref 0.2–1.2)
Total Protein: 7.4 g/dL (ref 6.0–8.3)

## 2018-10-24 LAB — LIPID PANEL
Cholesterol: 170 mg/dL (ref 0–200)
HDL: 41.3 mg/dL (ref >39.00)
LDL Cholesterol: 99 mg/dL (ref 0–99)
NonHDL: 128.55
Total CHOL/HDL Ratio: 4
Triglycerides: 148 mg/dL (ref 0.0–149.0)
VLDL: 29.6 mg/dL (ref 0.0–40.0)

## 2018-10-24 MED ORDER — GABAPENTIN 300 MG PO CAPS: ORAL_CAPSULE | ORAL | 2 refills | Status: DC

## 2018-10-24 MED ORDER — FLUTICASONE PROPIONATE 50 MCG/ACT NA SUSP: 2.0000 | Freq: Every day | NASAL | 6 refills | Status: DC

## 2018-10-24 MED ORDER — KLOR-CON M20 20 MEQ PO TBCR: 20.0000 meq | EXTENDED_RELEASE_TABLET | Freq: Every day | ORAL | 2 refills | Status: DC

## 2018-10-24 MED ORDER — ESOMEPRAZOLE MAGNESIUM 40 MG PO CPDR: 40.0000 mg | DELAYED_RELEASE_CAPSULE | Freq: Every day | ORAL | 3 refills | Status: DC

## 2018-10-24 MED ORDER — ROSUVASTATIN CALCIUM 10 MG PO TABS: 10.0000 mg | ORAL_TABLET | Freq: Every day | ORAL | 1 refills | Status: DC

## 2018-10-24 MED ORDER — LOSARTAN POTASSIUM 100 MG PO TABS: 100.0000 mg | ORAL_TABLET | Freq: Every day | ORAL | 2 refills | Status: DC

## 2018-10-24 MED ORDER — AMLODIPINE BESYLATE 10 MG PO TABS: 10.0000 mg | ORAL_TABLET | Freq: Every day | ORAL | 2 refills | Status: DC

## 2018-10-24 MED ORDER — BUDESONIDE-FORMOTEROL FUMARATE 160-4.5 MCG/ACT IN AERO: 2.0000 | INHALATION_SPRAY | Freq: Two times a day (BID) | RESPIRATORY_TRACT | 3 refills | Status: DC

## 2018-10-24 NOTE — Progress Notes (Signed)
[name] is a 63 y.o. male with the following history as recorded in EpicCare:  Patient Active Problem List   Diagnosis Date Noted  . Chronic obstructive pulmonary disease ([place]) 10/24/2018  . Cervical radiculopathy at C8 01/18/2018  . Right lateral epicondylitis 12/21/2017  . Pain in joint, shoulder region 04/23/2015  . Periumbilical abdominal pain 02/13/2015  . Elevated PSA 08/04/2014  . Subacromial bursitis 04/11/2014  . Neck pain on right side 03/05/2014  . Bilateral shoulder pain 03/05/2014  . Recurrent boils 03/05/2014  . Foreign body in stomach 12/31/2013  . Reflux esophagitis 12/31/2013  . Weight loss 05/06/2012  . Incisional hernia 11/23/2011  . Cramp of limb 01/13/2011  . Hyperthyroidism 08/11/2010  . Sebaceous cyst 08/10/2010  . HYPERTROPHY PROSTATE W/UR OBST & OTH LUTS 02/27/2010  . SMOKER 08/05/2009  . 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 HEMORRHOIDS, INTERNAL, WITH BLEEDING 04/26/2008  . Hyperlipidemia 12/27/2007  . LOW BACK PAIN, CHRONIC 11/08/2007  . Schizophrenia ([place]) 01/25/2007  . ANXIETY DISORDER, GENERALIZED 01/25/2007  . DEPRESSION 01/25/2007  . Essential hypertension 01/25/2007  . ALLERGIC RHINITIS 01/25/2007  . SCOLIOSIS NEC 01/25/2007  . Impaired glucose tolerance 01/25/2007    Current Outpatient Medications  Medication Sig Dispense Refill  . acetaminophen (TYLENOL) 500 MG tablet Take 1,000 mg by mouth every 6 (six) hours as needed for moderate pain.     [name] amLODipine (NORVASC) 10 MG tablet Take 1 tablet (10 mg total) by mouth daily. 90 tablet 2  . Blood Pressure Monitoring (BLOOD PRESSURE CUFF) MISC Use daily as directed to check blood pressure 1 each 0  . cyclobenzaprine (FLEXERIL) 10 MG tablet TAKE 1 TABLET BY MOUTH EVERYDAY AT BEDTIME 30 tablet 0  . fluPHENAZine (PROLIXIN) 10 MG tablet Take 2 tablets (20 mg total) by mouth daily. 60 tablet 0  . fluticasone (FLONASE) 50 MCG/ACT nasal spray Place 2 sprays into both nostrils daily. 16 g 6   . gabapentin (NEURONTIN) 300 MG capsule Take as bedtime as directed 90 capsule 2  . KLOR-CON M20 20 MEQ tablet Take 1 tablet (20 mEq total) by mouth daily. 90 tablet 2  . losartan (COZAAR) 100 MG tablet Take 1 tablet (100 mg total) by mouth daily. 90 tablet 2  . naproxen sodium (ALEVE) 220 MG tablet Take 440 mg by mouth daily as needed (pain).    . rosuvastatin (CRESTOR) 10 MG tablet Take 1 tablet (10 mg total) by mouth daily. 90 tablet 1  . tamsulosin (FLOMAX) 0.4 MG CAPS capsule Take 0.4 mg by mouth daily.    . budesonide-formoterol (SYMBICORT) 160-4.5 MCG/ACT inhaler Inhale 2 puffs into the lungs 2 (two) times daily. 1 Inhaler 3  . esomeprazole (NEXIUM) 40 MG capsule Take 1 capsule (40 mg total) by mouth daily. 90 capsule 3   No current facility-administered medications for this visit.     Allergies: Celecoxib, Iohexol, Metoclopramide hcl, and Wellbutrin [bupropion]  Past Medical History:  Diagnosis Date  . ALLERGIC RHINITIS 01/25/2007  . ANXIETY DISORDER, GENERALIZED 01/25/2007  . Arthritis   . DEPRESSION 01/25/2007  . ESOPHAGITIS 12/28/2007  . Fatty liver 10/09/2010  . GERD 01/25/2007  . GLUCOSE INTOLERANCE, HX OF 01/25/2007  . Heart murmur    hx of   . HEMORRHOIDS, INTERNAL, WITH BLEEDING 04/26/2008  . Hiatal hernia   . HYPERLIPIDEMIA 12/27/2007  . HYPERTENSION, ESSENTIAL NOS 01/25/2007  . Hyperthyroidism 08/11/2010  . HYPERTROPHY PROSTATE W/UR OBST & OTH LUTS 02/27/2010  . 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 LOW  BACK PAIN, CHRONIC 11/08/2007  . Pancreatitis   . Rectal abscess   . 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 SCOLIOSIS NEC 01/25/2007  . Sebaceous cyst 08/10/2010  . SMOKER 08/05/2009  . UPPER GASTROINTESTINAL HEMORRHAGE 01/25/2007    Past Surgical History:  Procedure Laterality Date  . ABDOMINAL EXPLORATION SURGERY  1988  . CHOLECYSTECTOMY  05/20/08   Dr. Zella Richer  . COLONOSCOPY  04/23/2010   normal  . ESOPHAGOGASTRODUODENOSCOPY N/A 12/31/2013   Procedure:  ESOPHAGOGASTRODUODENOSCOPY (EGD);  Surgeon: Gatha Mayer, MD;  Location: [facility];  Service: Endoscopy;  Laterality: N/A;  . Excision of scalp lesion  07/2009  . Excision of thyroid mass    . GANGLION CYST EXCISION     right foot x 2  . HERNIA REPAIR     ventral  . INCISIONAL HERNIA REPAIR  12/01/2011   Procedure: LAPAROSCOPIC INCISIONAL HERNIA;  Surgeon: Harl Bowie, MD;  Location: [facility];  Service: General;  Laterality: N/A;  . SHOULDER SURGERY     right    Family History  Problem Relation Age of Onset  . Hypertension Father   . Hypertension Mother   . Prostate cancer Other   . Colon cancer Neg Hx     Social History   Tobacco Use  . Smoking status: Current Every Day Smoker    Packs/day: 1.50    Years: 30.00    Pack years: 45.00    Types: Cigarettes  . Smokeless tobacco: Never Used  Substance Use Topics  . Alcohol use: No    Subjective:  6 month follow-up on chronic care needs including  1) HTN; 2) Hyperlipidemia; 3) GERD; 4) COPD- needs updated inhaler/ had sample of Dulera in 11/2017; Wife is present today; patient and wife both request to update medication list/ patient still confused about what medication to take daily; Denies any chest pain, shortness of breath, blurred vision or headache Not interested in quitting smoking- feels like he becomes more agitated/ hallucinates more when he tries to quit smoking Scheduled to see his orthopedist next week about continuing neck pain/ degenerative changes    Objective:  Vitals:   10/24/18 0858  BP: 130/82  Pulse: 99  Temp: 98.5 F (36.9 C)  TempSrc: Oral  SpO2: 99%  Weight: 224 lb (101.6 kg)  Height: '5\' 9"'$  (1.753 m)    General: Well developed, well nourished, in no acute distress  Skin : Warm and dry.  Head: Normocephalic and atraumatic  Lungs: Respirations unlabored; clear to auscultation bilaterally without wheeze, rales, rhonchi  CVS exam: normal rate and regular rhythm.  Neurologic: Alert and  oriented; speech intact; face symmetrical; moves all extremities well; CNII-XII intact without focal deficit   Assessment:  1. Essential hypertension   2. Mixed hyperlipidemia   3. Tobacco abuse   4. Gastroesophageal reflux disease, esophagitis presence not specified   5. Elevated PSA   6. Schizophrenia, unspecified type ([place])   7. Chronic obstructive pulmonary disease, unspecified COPD type ([place])   8. Ventral hernia without obstruction or gangrene     Plan:  1. Stable; continue same medications; check CBC, CMP; 2. Check lipid panel; stressed need to take his Crestor everyday; 3. Refer for lung cancer screen; 4. Trial of Nexium 40 mg daily; 5. Under care of urology; 6. Under care of psychiatrist; 7. Rx for Symbicort- use as needed; 8. Patient has been told he is not a surgical candidate until he quits smoking;  No follow-ups on file.  Orders Placed This Encounter  Procedures  . CBC w/Diff    Standing Status:   Future    Number of Occurrences:   1    Standing Expiration Date:   10/24/2019  . Comp Met (CMET)    Standing Status:   Future    Number of Occurrences:   1    Standing Expiration Date:   10/24/2019  . Lipid panel    Standing Status:   Future    Number of Occurrences:   1    Standing Expiration Date:   10/24/2019  . Ambulatory Referral for Lung Cancer Scre    Referral Priority:   Routine    Referral Type:   Consultation    Referral Reason:   Specialty Services Required    Number of Visits Requested:   1    Requested Prescriptions   Signed Prescriptions Disp Refills  . esomeprazole (NEXIUM) 40 MG capsule 90 capsule 3    Sig: Take 1 capsule (40 mg total) by mouth daily.  [name] amLODipine (NORVASC) 10 MG tablet 90 tablet 2    Sig: Take 1 tablet (10 mg total) by mouth daily.  [name] gabapentin (NEURONTIN) 300 MG capsule 90 capsule 2    Sig: Take as bedtime as directed  . losartan (COZAAR) 100 MG tablet 90 tablet 2    Sig: Take 1 tablet (100 mg total) by mouth daily.  .  rosuvastatin (CRESTOR) 10 MG tablet 90 tablet 1    Sig: Take 1 tablet (10 mg total) by mouth daily.  [name] KLOR-CON M20 20 MEQ tablet 90 tablet 2    Sig: Take 1 tablet (20 mEq total) by mouth daily.  . budesonide-formoterol (SYMBICORT) 160-4.5 MCG/ACT inhaler 1 Inhaler 3    Sig: Inhale 2 puffs into the lungs 2 (two) times daily.  . fluticasone (FLONASE) 50 MCG/ACT nasal spray 16 g 6    Sig: Place 2 sprays into both nostrils daily.

## 2018-10-31 ENCOUNTER — Institutional Professional Consult (permissible substitution): Payer: Medicare HMO | Admitting: Pulmonary Disease

## 2018-11-06 ENCOUNTER — Telehealth: Payer: Self-pay | Admitting: Pulmonary Disease

## 2018-11-06 NOTE — Telephone Encounter (Signed)
Original call was an appt reminder New pt appt with Dr. Loanne Drilling 7/24. ATC NA

## 2018-11-06 NOTE — Telephone Encounter (Signed)
Called & spoke w/ pt to let him know the original call was an appt reminder with Dr. Loanne Drilling on 11/10/2018 at 10:00 AM. Pt verbalized understanding with no additional questions. Nothing further needed at this time.

## 2018-11-09 NOTE — Progress Notes (Signed)
Subjective:   PATIENT ID: [name] GENDER: male DOB: June 07, 1955, MRN: [id number]   HPI  Chief Complaint  Patient presents with  . Consult    smoker    Reason for Visit: New consult for COPD and active smoker  [name] is a 63 year old male active smoker with schizophrenia, depression/anxiety, hypertension, reflux and allergic rhinitis who presents as a referral from his PCP for active tobacco abuse.  He reports he has been smoking since he was 59.  Smokes 2 packs/day for last 50 years.  Some days he can cut down to a few cigarettes a day but he usually smokes about 1 pack/day.  He is unable to take Chantix due to his schizophrenia and he states he is unwilling to go cold [place].  He does not like the lozenges, and gums or nicotine patch.  His desire for smoking is triggered whenever he has back pain.  He has been told that he has COPD but has never done any breathing tests.  In the last year he has gradually worsening shortness of breath.  He is unable to walk a block in his neighborhood without stopping due to joint pain and dyspnea. Unable to climb a flight of stairs without stopping and gasping for air.  Denies associated cough or wheezing.  Dyspnea worsens with activity.   He was recently started Symbicort two puffs twice a day. He reports he is compliant.  Has not noticed any improvement with inhalers yet.  Social History: Maternal cousin passed away from lung cancer in his 19s, nonsmoker  Environmental exposures: None  I have personally reviewed patient's past medical/family/social history, allergies, current medications.  Past Medical History:  Diagnosis Date  . ALLERGIC RHINITIS 01/25/2007  . ANXIETY DISORDER, GENERALIZED 01/25/2007  . Arthritis   . Chronic kidney disease    stage 3  . DEPRESSION 01/25/2007  . ESOPHAGITIS 12/28/2007  . Fatty liver 10/09/2010  . GERD 01/25/2007  . GLUCOSE INTOLERANCE, HX OF 01/25/2007  . Heart murmur    hx of   .  HEMORRHOIDS, INTERNAL, WITH BLEEDING 04/26/2008  . Hiatal hernia   . HYPERLIPIDEMIA 12/27/2007  . HYPERTENSION, ESSENTIAL NOS 01/25/2007  . Hyperthyroidism 08/11/2010  . HYPERTROPHY PROSTATE W/UR OBST & OTH LUTS 02/27/2010  . 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 LOW BACK PAIN, CHRONIC 11/08/2007  . Pancreatitis   . Prostate cancer ([place])   . Rectal abscess   . 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 SCOLIOSIS NEC 01/25/2007  . Sebaceous cyst 08/10/2010  . SMOKER 08/05/2009  . UPPER GASTROINTESTINAL HEMORRHAGE 01/25/2007     Family History  Problem Relation Age of Onset  . Hypertension Father   . Kidney disease Father   . Hypertension Mother   . Prostate cancer Other   . Colon cancer Neg Hx      Social History   Occupational History  . Occupation: Disabled    Employer: DISABLED  Tobacco Use  . Smoking status: Current Every Day Smoker    Packs/day: 2.00    Years: 49.00    Pack years: 98.00    Types: Cigarettes    Start date: 67  . Smokeless tobacco: Never Used  Substance and Sexual Activity  . Alcohol use: No  . Drug use: Yes    Frequency: 14.0 times per week    Types: Marijuana  . Sexual activity: Not on file    Allergies  Allergen Reactions  . Celecoxib Other (See Comments)  rectal bleeding  . Iohexol Other (See Comments)    Affects nerves: triggers schizophrenia  . Metoclopramide Hcl Other (See Comments)    Affects nerves: triggers schizophrenia  . Wellbutrin [Bupropion] Other (See Comments)    Makes pt smoke heavily     Outpatient Medications Prior to Visit  Medication Sig Dispense Refill  . acetaminophen (TYLENOL) 500 MG tablet Take 1,000 mg by mouth every 6 (six) hours as needed for moderate pain.     [name] amLODipine (NORVASC) 10 MG tablet Take 1 tablet (10 mg total) by mouth daily. 90 tablet 2  . Blood Pressure Monitoring (BLOOD PRESSURE CUFF) MISC Use daily as directed to check blood pressure 1 each 0  . budesonide-formoterol (SYMBICORT) 160-4.5  MCG/ACT inhaler Inhale 2 puffs into the lungs 2 (two) times daily. 1 Inhaler 3  . esomeprazole (NEXIUM) 40 MG capsule Take 1 capsule (40 mg total) by mouth daily. 90 capsule 3  . fluPHENAZine (PROLIXIN) 10 MG tablet Take 2 tablets (20 mg total) by mouth daily. 60 tablet 0  . fluticasone (FLONASE) 50 MCG/ACT nasal spray Place 2 sprays into both nostrils daily. 16 g 6  . gabapentin (NEURONTIN) 300 MG capsule Take as bedtime as directed 90 capsule 2  . KLOR-CON M20 20 MEQ tablet Take 1 tablet (20 mEq total) by mouth daily. 90 tablet 2  . losartan (COZAAR) 100 MG tablet Take 1 tablet (100 mg total) by mouth daily. 90 tablet 2  . naproxen sodium (ALEVE) 220 MG tablet Take 440 mg by mouth daily as needed (pain).    . rosuvastatin (CRESTOR) 10 MG tablet Take 1 tablet (10 mg total) by mouth daily. 90 tablet 1  . tamsulosin (FLOMAX) 0.4 MG CAPS capsule Take 0.4 mg by mouth daily.    . cyclobenzaprine (FLEXERIL) 10 MG tablet TAKE 1 TABLET BY MOUTH EVERYDAY AT BEDTIME 30 tablet 0   No facility-administered medications prior to visit.     Review of Systems  Constitutional: Negative for chills, diaphoresis, fever, malaise/fatigue and weight loss.  HENT: Positive for congestion. Negative for ear pain and sore throat.   Respiratory: Negative for cough, hemoptysis, sputum production, shortness of breath and wheezing.   Cardiovascular: Negative for chest pain, palpitations and leg swelling.  Gastrointestinal: Positive for heartburn. Negative for abdominal pain and nausea.  Genitourinary: Positive for frequency.  Musculoskeletal: Positive for joint pain and myalgias.  Skin: Negative for itching and rash.  Neurological: Negative for dizziness, weakness and headaches.  Endo/Heme/Allergies: Does not bruise/bleed easily.  Psychiatric/Behavioral: Negative for depression. The patient is not nervous/anxious.      Objective:   Vitals:   11/10/18 1008 11/10/18 1009  BP:  126/70  Pulse:  (!) 102  Temp: 98.1  F (36.7 C)   TempSrc: Oral   SpO2:  99%  Weight: 230 lb (104.3 kg)   Height: 5\' 9"  (1.753 m)    SpO2: 99 % O2 Device: None (Room air)  Physical Exam: General: Well-appearing, no acute distress HENT: [place], AT Eyes: EOMI, no scleral icterus Respiratory: Diminished breath sounds bilaterally.  No crackles, wheezing or rales Cardiovascular: RRR, -M/R/G, no JVD GI: BS+, soft, nontender Extremities:-Edema,-tenderness Neuro: AAO x4, CNII-XII grossly intact Skin: Intact, no rashes or bruising Psych: Normal mood, normal affect  Data Reviewed:  Imaging: CXR 04/12/2018-no infiltrate, effusion or edema.  PFT: None on file  Labs: CBC and CMP 10/24/2018-normal WBC 8.1 and Hg 14.6 Imaging, labs and tests noted above have been reviewed independently by me.  Assessment & Plan:   Discussion: 63 year old male active smoker with multiple psychiatric issues including schizophrenia depression and anxiety and suspected COPD who presents as a new consult for the following issues:  Shortness of breath  Tobacco Abuse  Medications: --CONTINUE Symbicort 160-4.5 mcg 2 puffs twice a day --CONTINUE Albuterol 2 puffs as needed  Tests ordered: --ORDER Pulmonary function tests to evaluate for lung disease  STOP SMOKING! Let us know if you decide you are interested cognitive behavior therapy to help with smoking cessation.  Tobacco abuse Patient is an active smoker. We discussed smoking cessation for 10 minutes. We discussed triggers and stressors and ways to deal with them. We discussed barriers to continued smoking and benefits of smoking cessation. Provided patient with information cessation techniques and interventions including [place] quitline.   Health Maintenance Pneumonia 10/19/17 Influenza - Patient declines CT Lung Screen -refer to lung cancer screening clinic.  Patient qualifies for smoking history, age and active smoking within 15 years  Orders Placed This Encounter  Procedures  .  Ambulatory Referral for Lung Cancer Scre    Referral Priority:   Routine    Referral Type:   Consultation    Referral Reason:   Specialty Services Required    Number of Visits Requested:   1  . Pulmonary function test    Standing Status:   Future    Standing Expiration Date:   11/10/2019    Order Specific Question:   Where should this test be performed?    Answer:   [facility]    Order Specific Question:   Full PFT: includes the following: basic spirometry, spirometry pre & post bronchodilator, diffusion capacity (DLCO), lung volumes    Answer:   Full PFT   Meds ordered this encounter  Medications  . albuterol (VENTOLIN HFA) 108 (90 Base) MCG/ACT inhaler    Sig: Inhale 2 puffs into the lungs every 6 (six) hours as needed for wheezing or shortness of breath.    Dispense:  8 g    Refill:  6    Return in about 3 months (around 02/10/2019).  Strang, MD [facility] 11/17/2018 5:16 PM  Office Number [phone]

## 2018-11-10 ENCOUNTER — Other Ambulatory Visit: Payer: Self-pay

## 2018-11-10 ENCOUNTER — Encounter: Payer: Self-pay | Admitting: Pulmonary Disease

## 2018-11-10 ENCOUNTER — Ambulatory Visit: Payer: Medicare HMO | Admitting: Pulmonary Disease

## 2018-11-10 VITALS — BP 126/70 | HR 102 | Temp 98.1°F | Ht 69.0 in | Wt 230.0 lb

## 2018-11-10 DIAGNOSIS — Z122 Encounter for screening for malignant neoplasm of respiratory organs: Secondary | ICD-10-CM

## 2018-11-10 DIAGNOSIS — F17210 Nicotine dependence, cigarettes, uncomplicated: Secondary | ICD-10-CM | POA: Diagnosis not present

## 2018-11-10 DIAGNOSIS — R0602 Shortness of breath: Secondary | ICD-10-CM

## 2018-11-10 MED ORDER — ALBUTEROL SULFATE HFA 108 (90 BASE) MCG/ACT IN AERS: 2.0000 | INHALATION_SPRAY | Freq: Four times a day (QID) | RESPIRATORY_TRACT | 6 refills | Status: DC | PRN

## 2018-11-10 NOTE — Patient Instructions (Signed)
Shortness of breath Tobacco Abuse  Medications: --CONTINUE Symbicort 160-4.5 mcg 2 puffs twice a day --CONTINUE Albuterol 2 puffs as needed  Tests ordered: --ORDER Pulmonary function tests to evaluate for lung disease  STOP SMOKING! Let us know if you decide you are interested cognitive behavior therapy to help with smoking cessation.    Smoking Tobacco Information, Adult Smoking tobacco can be harmful to your health. Tobacco contains a poisonous (toxic), colorless chemical called nicotine. Nicotine is addictive. It changes the brain and can make it hard to stop smoking. Tobacco also has other toxic chemicals that can hurt your body and raise your risk of many cancers. How can smoking tobacco affect me? Smoking tobacco puts you at risk for:  Cancer. Smoking is most commonly associated with lung cancer, but can also lead to cancer in other parts of the body.  Chronic obstructive pulmonary disease (COPD). This is a long-term lung condition that makes it hard to breathe. It also gets worse over time.  High blood pressure (hypertension), heart disease, stroke, or heart attack.  Lung infections, such as pneumonia.  Cataracts. This is when the lenses in the eyes become clouded.  Digestive problems. This may include peptic ulcers, heartburn, and gastroesophageal reflux disease (GERD).  Oral health problems, such as gum disease and tooth loss.  Loss of taste and smell. Smoking can affect your appearance by causing:  Wrinkles.  Yellow or stained teeth, fingers, and fingernails. Smoking tobacco can also affect your social life, because:  It may be challenging to find places to smoke when away from home. Many workplaces, [employer], hotels, and public places are tobacco-free.  Smoking is expensive. This is due to the cost of tobacco and the long-term costs of treating health problems from smoking.  Secondhand smoke may affect those around you. Secondhand smoke can cause lung  cancer, breathing problems, and heart disease. Children of smokers have a higher risk for: ? Sudden infant death syndrome (SIDS). ? Ear infections. ? Lung infections. If you currently smoke tobacco, quitting now can help you:  Lead a longer and healthier life.  Look, smell, breathe, and feel better over time.  Save money.  Protect others from the harms of secondhand smoke. What actions can I take to prevent health problems? Quit smoking   Do not start smoking. Quit if you already do.  Make a plan to quit smoking and commit to it. Look for programs to help you and ask your health care provider for recommendations and ideas.  Set a date and write down all the reasons you want to quit.  Let your friends and family know you are quitting so they can help and support you. Consider finding friends who also want to quit. It can be easier to quit with someone else, so that you can support each other.  Talk with your health care provider about using nicotine replacement medicines to help you quit, such as gum, lozenges, patches, sprays, or pills.  Do not replace cigarette smoking with electronic cigarettes, which are commonly called e-cigarettes. The safety of e-cigarettes is not known, and some may contain harmful chemicals.  If you try to quit but return to smoking, stay positive. It is common to slip up when you first quit, so take it one day at a time.  Be prepared for cravings. When you feel the urge to smoke, chew gum or suck on hard candy. Lifestyle  Stay busy and take care of your body.  Drink enough fluid to keep your  urine pale yellow.  Get plenty of exercise and eat a healthy diet. This can help prevent weight gain after quitting.  Monitor your eating habits. Quitting smoking can cause you to have a larger appetite than when you smoke.  Find ways to relax. Go out with friends or family to a movie or a restaurant where people do not smoke.  Ask your health care provider  about having regular tests (screenings) to check for cancer. This may include blood tests, imaging tests, and other tests.  Find ways to manage your stress, such as meditation, yoga, or exercise. Where to find support To get support to quit smoking, consider:  Asking your health care provider for more information and resources.  Taking classes to learn more about quitting smoking.  Looking for local organizations that offer resources about quitting smoking.  Joining a support group for people who want to quit smoking in your local community.  Calling the smokefree.gov counselor helpline: 1-800-Quit-Now 4803543153) Where to find more information You may find more information about quitting smoking from:  HelpGuide.org: www.helpguide.org  https://hall.com/: smokefree.gov  American Lung Association: www.lung.org Contact a health care provider if you:  Have problems breathing.  Notice that your lips, nose, or fingers turn blue.  Have chest pain.  Are coughing up blood.  Feel faint or you pass out.  Have other health changes that cause you to worry. Summary  Smoking tobacco can negatively affect your health, the health of those around you, your finances, and your social life.  Do not start smoking. Quit if you already do. If you need help quitting, ask your health care provider.  Think about joining a support group for people who want to quit smoking in your local community. There are many effective programs that will help you to quit this behavior. This information is not intended to replace advice given to you by your health care provider. Make sure you discuss any questions you have with your health care provider. Document Released: 04/20/2016 Document Revised: 05/25/2017 Document Reviewed: 04/20/2016 Elsevier Patient Education  2020 [employer].

## 2018-11-13 ENCOUNTER — Other Ambulatory Visit: Payer: Self-pay | Admitting: Family

## 2018-11-18 ENCOUNTER — Encounter: Payer: Self-pay | Admitting: Pulmonary Disease

## 2018-11-20 ENCOUNTER — Telehealth: Payer: Self-pay | Admitting: Pulmonary Disease

## 2018-11-20 DIAGNOSIS — Z122 Encounter for screening for malignant neoplasm of respiratory organs: Secondary | ICD-10-CM

## 2018-11-20 DIAGNOSIS — F17210 Nicotine dependence, cigarettes, uncomplicated: Secondary | ICD-10-CM

## 2018-11-20 DIAGNOSIS — Z87891 Personal history of nicotine dependence: Secondary | ICD-10-CM

## 2018-11-20 NOTE — Telephone Encounter (Signed)
Pt returning call low dose CT.

## 2018-11-20 NOTE — Telephone Encounter (Signed)
[place] x 1 - Advised that I will be back in office on 8/7/200 and will call pt back at that time . Please leave in my box to call pt.

## 2018-11-24 NOTE — Telephone Encounter (Signed)
Spoke with Dianna, pt's wife and scheduled pt for [facility] 12/11/18 2:30 CT ordered Nothing further needed

## 2018-12-11 ENCOUNTER — Ambulatory Visit: Payer: Medicare HMO | Admitting: Acute Care

## 2018-12-11 ENCOUNTER — Other Ambulatory Visit: Payer: Self-pay

## 2018-12-11 ENCOUNTER — Ambulatory Visit
Admission: RE | Admit: 2018-12-11 | Discharge: 2018-12-11 | Disposition: A | Discharge status: Home / Self Care | Payer: Medicare HMO | Source: Ambulatory Visit | Attending: Acute Care | Admitting: Acute Care

## 2018-12-11 ENCOUNTER — Encounter: Payer: Self-pay | Admitting: Acute Care

## 2018-12-11 VITALS — BP 138/80 | HR 94 | Ht 69.0 in | Wt 234.6 lb

## 2018-12-11 DIAGNOSIS — F17210 Nicotine dependence, cigarettes, uncomplicated: Secondary | ICD-10-CM

## 2018-12-11 DIAGNOSIS — Z122 Encounter for screening for malignant neoplasm of respiratory organs: Secondary | ICD-10-CM

## 2018-12-11 DIAGNOSIS — Z87891 Personal history of nicotine dependence: Secondary | ICD-10-CM

## 2018-12-11 NOTE — Progress Notes (Signed)
Shared Decision Making Visit Lung Cancer Screening Program [phone])   Eligibility:  Age 63 y.o.  Pack Years Smoking History Calculation 117 pack year smoking history (# packs/per year x # years smoked)  Recent History of coughing up blood  no  Unexplained weight loss? no ( >Than 15 pounds within the last 6 months )  Prior History Lung / other cancer no (Diagnosis within the last 5 years already requiring surveillance chest CT Scans).  Smoking Status Current Smoker  Former Smokers: Years since quit: NA  Quit Date: NA  Visit Components:  Discussion included one or more decision making aids. yes  Discussion included risk/benefits of screening. yes  Discussion included potential follow up diagnostic testing for abnormal scans. yes  Discussion included meaning and risk of over diagnosis. yes  Discussion included meaning and risk of False Positives. yes  Discussion included meaning of total radiation exposure. yes  Counseling Included:  Importance of adherence to annual lung cancer LDCT screening. yes  Impact of comorbidities on ability to participate in the program. yes  Ability and willingness to under diagnostic treatment. yes  Smoking Cessation Counseling:  Current Smokers:   Discussed importance of smoking cessation. yes  Information about tobacco cessation classes and interventions provided to patient. yes  Patient provided with "ticket" for LDCT Scan. yes  Symptomatic Patient. no  Counseling  Diagnosis Code: Tobacco Use Z72.0  Asymptomatic Patient yes  Counseling (Intermediate counseling: > three minutes counseling) [id number]  Former Smokers:   Discussed the importance of maintaining cigarette abstinence. yes  Diagnosis Code: Personal History of Nicotine Dependence. [id number]  Information about tobacco cessation classes and interventions provided to patient. Yes  Patient provided with "ticket" for LDCT Scan. yes  Written Order for Lung Cancer  Screening with LDCT placed in Epic. Yes (CT Chest Lung Cancer Screening Low Dose W/O CM) [id number] Z12.2-Screening of respiratory organs Z87.891-Personal history of nicotine dependence  BP 138/80 (BP Location: Right Arm, Cuff Size: Normal)   Pulse 94   Ht 5\' 9"  (1.753 m)   Wt 234 lb 9.6 oz (106.4 kg)   SpO2 98%   BMI 34.64 kg/m    I have spent 25 minutes of face to face time with [name] discussing the risks and benefits of lung cancer screening. We viewed a power point together that explained in detail the above noted topics. We paused at intervals to allow for questions to be asked and answered to ensure understanding.We discussed that the single most powerful action that he can take to decrease his risk of developing lung cancer is to quit smoking. We discussed whether or not he is ready to commit to setting a quit date. We discussed options for tools to aid in quitting smoking including nicotine replacement therapy, non-nicotine medications, support groups, Quit Smart classes, and behavior modification. We discussed that often times setting smaller, more achievable goals, such as eliminating 1 cigarette a day for a week and then 2 cigarettes a day for a week can be helpful in slowly decreasing the number of cigarettes smoked. This allows for a sense of accomplishment as well as providing a clinical benefit. I gave him the " Be Stronger Than Your Excuses" card with contact information for community resources, classes, free nicotine replacement therapy, and access to mobile apps, text messaging, and on-line smoking cessation help. I have also given him my card and contact information in the event he needs to contact me. We discussed the time and location of the scan, and that  either [name] Glassman RN or I will call with the results within 24-48 hours of receiving them. I have offered him  a copy of the power point we viewed  as a resource in the event they need reinforcement of the concepts we  discussed today in the office. The patient verbalized understanding of all of  the above and had no further questions upon leaving the office. They have my contact information in the event they have any further questions.  I spent 5 minutes counseling on smoking cessation and the health risks of continued tobacco abuse.  I explained to the patient that there has been a high incidence of coronary artery disease noted on these exams. I explained that this is a non-gated exam therefore degree or severity cannot be determined. This patient is on statin therapy. I have asked the patient to follow-up with their PCP regarding any incidental finding of coronary artery disease and management with diet or medication as their PCP  feels is clinically indicated. The patient verbalized understanding of the above and had no further questions upon completion of the visit.      [name] Spatz, NP 12/11/2018 2:54 PM

## 2018-12-11 NOTE — Patient Instructions (Signed)
Thank you for participating in the [facility] Screening [facility]. It was our pleasure to meet you today. We will call you with the results of your scan within the next few days. Your scan will be assigned a Lung RADS category score by the physicians reading the scans.  This Lung RADS score determines follow up scanning.  See below for description of categories, and follow up screening recommendations. We will be in touch to schedule your follow up screening annually or based on recommendations of our providers. We will fax a copy of your scan results to your Primary Care Physician, or the physician who referred you to the program, to ensure they have the results. Please call the office if you have any questions or concerns regarding your scanning experience or results.  Our office number is [phone]. Please speak with Denise Phelps, RN. She is our Lung Cancer Screening RN. If she is unavailable when you call, please have the office staff send her a message. She will return your call at her earliest convenience. Remember, if your scan is normal, we will scan you annually as long as you continue to meet the criteria for the program. (Age 55-77, Current smoker or smoker who has quit within the last 15 years). If you are a smoker, remember, quitting is the single most powerful action that you can take to decrease your risk of lung cancer and other pulmonary, breathing related problems. We know quitting is hard, and we are here to help.  Please let us know if there is anything we can do to help you meet your goal of quitting. If you are a former smoker, congratulations. We are proud of you! Remain smoke free! Remember you can refer friends or family members through the number above.  We will screen them to make sure they meet criteria for the program. Thank you for helping us take better care of you by participating in Lung Screening.  Lung RADS Categories:  Lung RADS 1: no nodules  or definitely non-concerning nodules.  Recommendation is for a repeat annual scan in 12 months.  Lung RADS 2:  nodules that are non-concerning in appearance and behavior with a very low likelihood of becoming an active cancer. Recommendation is for a repeat annual scan in 12 months.  Lung RADS 3: nodules that are probably non-concerning , includes nodules with a low likelihood of becoming an active cancer.  Recommendation is for a 6-month repeat screening scan. Often noted after an upper respiratory illness. We will be in touch to make sure you have no questions, and to schedule your 6-month scan.  Lung RADS 4 A: nodules with concerning findings, recommendation is most often for a follow up scan in 3 months or additional testing based on our provider's assessment of the scan. We will be in touch to make sure you have no questions and to schedule the recommended 3 month follow up scan.  Lung RADS 4 B:  indicates findings that are concerning. We will be in touch with you to schedule additional diagnostic testing based on our provider's  assessment of the scan.   

## 2018-12-14 ENCOUNTER — Telehealth: Payer: Self-pay | Admitting: Acute Care

## 2018-12-14 DIAGNOSIS — Z87891 Personal history of nicotine dependence: Secondary | ICD-10-CM

## 2018-12-14 DIAGNOSIS — F17210 Nicotine dependence, cigarettes, uncomplicated: Secondary | ICD-10-CM

## 2018-12-14 DIAGNOSIS — Z122 Encounter for screening for malignant neoplasm of respiratory organs: Secondary | ICD-10-CM

## 2018-12-15 NOTE — Telephone Encounter (Signed)
Pt's wife informed of CT results per Eric Form, NP.  Pt's wife verbalized understanding.  Copy sent to PCP.  Order placed for 1 yr f/u CT.

## 2018-12-15 NOTE — Telephone Encounter (Signed)
LMTC x 1  

## 2019-01-01 ENCOUNTER — Other Ambulatory Visit: Payer: Self-pay | Admitting: Family

## 2019-01-01 ENCOUNTER — Telehealth: Payer: Self-pay | Admitting: Family

## 2019-01-01 MED ORDER — CYCLOBENZAPRINE HCL 10 MG PO TABS: ORAL_TABLET | ORAL | 3 refills | Status: DC

## 2019-01-01 NOTE — Telephone Encounter (Signed)
rx refill cyclobenzaprine (FLEXERIL) 10 MG tablet  PHARMACY [id number] [address] [place], [place] [address] [phone] (Phone) [phone] (Fax)

## 2019-01-10 ENCOUNTER — Other Ambulatory Visit: Payer: Self-pay | Admitting: Internal Medicine

## 2019-02-12 ENCOUNTER — Ambulatory Visit: Payer: Medicare HMO | Admitting: Adult Health

## 2019-02-28 ENCOUNTER — Ambulatory Visit (INDEPENDENT_AMBULATORY_CARE_PROVIDER_SITE_OTHER): Payer: Medicare PPO | Admitting: Adult Health

## 2019-02-28 ENCOUNTER — Encounter: Payer: Self-pay | Admitting: Adult Health

## 2019-02-28 ENCOUNTER — Other Ambulatory Visit: Payer: Self-pay

## 2019-02-28 VITALS — BP 122/82 | HR 104 | Temp 98.0°F | Ht 68.7 in | Wt 238.0 lb

## 2019-02-28 DIAGNOSIS — J449 Chronic obstructive pulmonary disease, unspecified: Secondary | ICD-10-CM

## 2019-02-28 DIAGNOSIS — F17200 Nicotine dependence, unspecified, uncomplicated: Secondary | ICD-10-CM | POA: Diagnosis not present

## 2019-02-28 NOTE — Assessment & Plan Note (Signed)
Refer to pharmacy team for smoking cessation Low-dose CT screening August 2020 with benign appearance continue screening program

## 2019-02-28 NOTE — Patient Instructions (Signed)
Continue on Symbicort 2 puffs twice daily, rinse after use Work on cutting back on your smoking Referral to the pharmacy team for smoking cessation counseling Please return for pulmonary function testing. Follow-up with Dr. Loanne Drilling in 4 months and as needed

## 2019-02-28 NOTE — Assessment & Plan Note (Signed)
Suspected COPD and patient with heavy smoking history.  Patient is to return for PFTs.  Continue on Symbicort. Smoking cessation encouraged Recent low-dose CT screening with benign appearance  Plan  Patient Instructions  Continue on Symbicort 2 puffs twice daily, rinse after use Work on cutting back on your smoking Referral to the pharmacy team for smoking cessation counseling Please return for pulmonary function testing. Follow-up with Dr. Loanne Drilling in 4 months and as needed

## 2019-02-28 NOTE — Progress Notes (Signed)
@Patient  ID: [name], male    DOB: 03-06-1956, 63 y.o.   MRN: [id number]  Chief Complaint  Patient presents with  . Follow-up    COPD     Referring provider: Marrian Salvage,*  HPI: 63 year old male active smoker (heavy) seen for pulmonary consult November 10, 2018 for COPD. Medical history is significant for schizophrenia, depression, anxiety, hypertension, reflux, allergic rhinitis  TEST/EVENTS :  CXR 04/12/2018-no infiltrate, effusion or edema.  Low-dose CT chest December 11, 2018 showed a 2.8 mm subpleural nodule in the right upper lobe, mild emphysema, lung RADS 2 benign appearance with recommendations for a follow-up CT in 12 months  02/28/2019 Follow up: COPD  Patient presents for a 48-month follow-up.  Patient was seen in July for a pulmonary consult for COPD.  He has a heavy smoking history.  Currently smoking up to 2 packs a day.  He has smoked for the last 50 years.  He remains on Symbicort twice daily.  He was set up for a low-dose CT screening that showed a benign appearance.  Along with emphysematous changes. He was set up for pulmonary function testing however he did not complete these.  We discussed the importance of returning for this. He agreed.  Says he is not very active due to chronic back pain.  No significant cough or wheezing. Gets winded with heavy activity .  Declines flu shot.     Allergies  Allergen Reactions  . Celecoxib Other (See Comments)     rectal bleeding  . Iohexol Other (See Comments)    Affects nerves: triggers schizophrenia  . Metoclopramide Hcl Other (See Comments)    Affects nerves: triggers schizophrenia  . Wellbutrin [Bupropion] Other (See Comments)    Makes pt smoke heavily    Immunization History  Administered Date(s) Administered  . Pneumococcal Conjugate-13 10/19/2017  . Td 04/19/1992  . Tdap 10/19/2017    Past Medical History:  Diagnosis Date  . ALLERGIC RHINITIS 01/25/2007  . ANXIETY DISORDER, GENERALIZED  01/25/2007  . Arthritis   . Chronic kidney disease    stage 3  . DEPRESSION 01/25/2007  . ESOPHAGITIS 12/28/2007  . Fatty liver 10/09/2010  . GERD 01/25/2007  . GLUCOSE INTOLERANCE, HX OF 01/25/2007  . Heart murmur    hx of   . HEMORRHOIDS, INTERNAL, WITH BLEEDING 04/26/2008  . Hiatal hernia   . HYPERLIPIDEMIA 12/27/2007  . HYPERTENSION, ESSENTIAL NOS 01/25/2007  . Hyperthyroidism 08/11/2010  . HYPERTROPHY PROSTATE W/UR OBST & OTH LUTS 02/27/2010  . 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 LOW BACK PAIN, CHRONIC 11/08/2007  . Pancreatitis   . Prostate cancer ([place])   . Rectal abscess   . 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 SCOLIOSIS NEC 01/25/2007  . Sebaceous cyst 08/10/2010  . SMOKER 08/05/2009  . UPPER GASTROINTESTINAL HEMORRHAGE 01/25/2007    Tobacco History: Social History   Tobacco Use  Smoking Status Current Every Day Smoker  . Packs/day: 2.50  . Years: 49.00  . Pack years: 122.50  . Types: Cigarettes  . Start date: 59  Smokeless Tobacco Never Used   Ready to quit: No Counseling given: Not Answered   Outpatient Medications Prior to Visit  Medication Sig Dispense Refill  . acetaminophen (TYLENOL) 500 MG tablet Take 1,000 mg by mouth every 6 (six) hours as needed for moderate pain.     [name] albuterol (VENTOLIN HFA) 108 (90 Base) MCG/ACT inhaler Inhale 2 puffs into the lungs every 6 (six) hours as needed for wheezing or  shortness of breath. 8 g 6  . amLODipine (NORVASC) 10 MG tablet Take 1 tablet (10 mg total) by mouth daily. 90 tablet 2  . cyclobenzaprine (FLEXERIL) 10 MG tablet 1 tablet po qhs 30 tablet 3  . esomeprazole (NEXIUM) 40 MG capsule Take 1 capsule (40 mg total) by mouth daily. 90 capsule 3  . fluPHENAZine (PROLIXIN) 10 MG tablet Take 2 tablets (20 mg total) by mouth daily. 60 tablet 0  . fluticasone (FLONASE) 50 MCG/ACT nasal spray Place 2 sprays into both nostrils daily. 16 g 6  . gabapentin (NEURONTIN) 300 MG capsule Take as bedtime as directed 90 capsule 2   . KLOR-CON M20 20 MEQ tablet Take 1 tablet (20 mEq total) by mouth daily. 90 tablet 2  . naproxen sodium (ALEVE) 220 MG tablet Take 440 mg by mouth daily as needed (pain).    . rosuvastatin (CRESTOR) 10 MG tablet Take 1 tablet (10 mg total) by mouth daily. 90 tablet 1  . SYMBICORT 160-4.5 MCG/ACT inhaler TAKE 2 PUFFS BY MOUTH TWICE A DAY 30.6 Inhaler 1  . tamsulosin (FLOMAX) 0.4 MG CAPS capsule Take 0.4 mg by mouth daily.    . Blood Pressure Monitoring (BLOOD PRESSURE CUFF) MISC Use daily as directed to check blood pressure (Patient not taking: Reported on 02/28/2019) 1 each 0  . losartan (COZAAR) 100 MG tablet Take 1 tablet (100 mg total) by mouth daily. (Patient not taking: Reported on 02/28/2019) 90 tablet 2   No facility-administered medications prior to visit.      Review of Systems:   Constitutional:   No  weight loss, night sweats,  Fevers, chills, + fatigue, or  lassitude.  HEENT:   No headaches,  Difficulty swallowing,  Tooth/dental problems, or  Sore throat,                No sneezing, itching, ear ache, nasal congestion, post nasal drip,   CV:  No chest pain,  Orthopnea, PND, swelling in lower extremities, anasarca, dizziness, palpitations, syncope.   GI  No heartburn, indigestion, abdominal pain, nausea, vomiting, diarrhea, change in bowel habits, loss of appetite, bloody stools.   Resp:  No excess mucus, no productive cough,  No non-productive cough,  No coughing up of blood.  No change in color of mucus.  No wheezing.  No chest wall deformity  Skin: no rash or lesions.  GU: no dysuria, change in color of urine, no urgency or frequency.  No flank pain, no hematuria   MS:  + chronic back pain    Physical Exam  BP 122/82 (BP Location: Left Arm, Cuff Size: Normal)   Pulse (!) 104   Temp 98 F (36.7 C) (Temporal)   Ht 5' 8.7" (1.745 m) Comment: with shoes on  Wt 238 lb (108 kg)   SpO2 100%   BMI 35.45 kg/m   GEN: A/Ox3; pleasant , NAD, obese per BMI    HEENT:   [place]/AT,    NOSE-clear, THROAT-clear, no lesions, no postnasal drip or exudate noted.  Poor dentition   NECK:  Supple w/ fair ROM; no JVD; normal carotid impulses w/o bruits; no thyromegaly or nodules palpated; no lymphadenopathy.    RESP  Clear  P & A; w/o, wheezes/ rales/ or rhonchi. no accessory muscle use, no dullness to percussion  CARD:  RRR, no m/r/g, tr  peripheral edema, pulses intact, no cyanosis or clubbing.  GI:   Soft & nt; nml bowel sounds; no organomegaly or masses detected.  Musco: Warm bil, no deformities or joint swelling noted.   Neuro: alert, no focal deficits noted.    Skin: Warm, no lesions or rashes    Lab Results:  CBC   BNP No results found for: BNP  ProBNP No results found for: PROBNP  Imaging: No results found.    No flowsheet data found.  No results found for: NITRICOXIDE      Assessment & Plan:   Chronic obstructive pulmonary disease ([place]) Suspected COPD and patient with heavy smoking history.  Patient is to return for PFTs.  Continue on Symbicort. Smoking cessation encouraged Recent low-dose CT screening with benign appearance  Plan  Patient Instructions  Continue on Symbicort 2 puffs twice daily, rinse after use Work on cutting back on your smoking Referral to the pharmacy team for smoking cessation counseling Please return for pulmonary function testing. Follow-up with Dr. Loanne Drilling in 4 months and as needed        SMOKER Refer to pharmacy team for smoking cessation Low-dose CT screening August 2020 with benign appearance continue screening program     Rexene Edison, NP 02/28/2019

## 2019-04-27 ENCOUNTER — Inpatient Hospital Stay (HOSPITAL_COMMUNITY): Admission: RE | Admit: 2019-04-27 | Payer: Medicare PPO | Source: Ambulatory Visit

## 2019-04-30 ENCOUNTER — Emergency Department (HOSPITAL_COMMUNITY): Payer: Medicare HMO

## 2019-04-30 ENCOUNTER — Emergency Department (HOSPITAL_COMMUNITY)
Admission: EM | Admit: 2019-04-30 | Discharge: 2019-04-30 | Disposition: A | Discharge status: Home / Self Care | Payer: Medicare HMO | Attending: Emergency Medicine | Admitting: Emergency Medicine

## 2019-04-30 DIAGNOSIS — I129 Hypertensive chronic kidney disease with stage 1 through stage 4 chronic kidney disease, or unspecified chronic kidney disease: Secondary | ICD-10-CM | POA: Insufficient documentation

## 2019-04-30 DIAGNOSIS — M549 Dorsalgia, unspecified: Secondary | ICD-10-CM

## 2019-04-30 DIAGNOSIS — M545 Low back pain, unspecified: Secondary | ICD-10-CM

## 2019-04-30 DIAGNOSIS — F17210 Nicotine dependence, cigarettes, uncomplicated: Secondary | ICD-10-CM | POA: Insufficient documentation

## 2019-04-30 DIAGNOSIS — G8929 Other chronic pain: Secondary | ICD-10-CM

## 2019-04-30 DIAGNOSIS — M791 Myalgia, unspecified site: Secondary | ICD-10-CM

## 2019-04-30 DIAGNOSIS — Z79899 Other long term (current) drug therapy: Secondary | ICD-10-CM | POA: Insufficient documentation

## 2019-04-30 DIAGNOSIS — N183 Chronic kidney disease, stage 3 unspecified: Secondary | ICD-10-CM | POA: Insufficient documentation

## 2019-04-30 DIAGNOSIS — M542 Cervicalgia: Secondary | ICD-10-CM | POA: Diagnosis not present

## 2019-04-30 DIAGNOSIS — M546 Pain in thoracic spine: Secondary | ICD-10-CM | POA: Diagnosis not present

## 2019-04-30 DIAGNOSIS — R52 Pain, unspecified: Secondary | ICD-10-CM | POA: Diagnosis not present

## 2019-04-30 DIAGNOSIS — I10 Essential (primary) hypertension: Secondary | ICD-10-CM | POA: Diagnosis not present

## 2019-04-30 MED ORDER — DICLOFENAC SODIUM 1 % EX GEL: 2.0000 g | Freq: Four times a day (QID) | CUTANEOUS | 1 refills | Status: DC

## 2019-04-30 MED ORDER — OXYCODONE-ACETAMINOPHEN 5-325 MG PO TABS
1.0000 | ORAL_TABLET | Freq: Once | ORAL | Status: AC
  Administered 2019-04-30: 13:00:00 1 via ORAL
  Filled 2019-04-30: qty 1

## 2019-04-30 MED ORDER — HYDROCODONE-ACETAMINOPHEN 5-325 MG PO TABS: 1.0000 | ORAL_TABLET | ORAL | 0 refills | Status: DC | PRN

## 2019-04-30 NOTE — ED Triage Notes (Signed)
Per EMS- Patient c/o neck and back pain x 40 years.  Patient states he has not had his schizophrenia meds x 2-4 weeks and is noncompliant with BP meds.   Patient states he is here for his neck and back as per EMS.

## 2019-04-30 NOTE — ED Provider Notes (Signed)
[facility] DEPT Provider Note   CSN: [id number] Arrival date & time: 04/30/19  1153     History Chief Complaint  Patient presents with   Neck Pain   Back Pain    [name] is a 64 y.o. male   HPI with significant past medical history of back pain that is been ongoing for 40 years.  States that he is taking all of his medications as prescribed which is inconsistent with what was noted in triage note.  This that he is followed by an orthopedic doctor and states he was able to be seen in the next week.  States that he has been on muscle relaxers in the past which have not helped most recently Flexeril.  Patient states that today his pain was similar to how it has been over the past 40 years however just felt worse.  He has a history of shoulder surgery  Patient states that none of his symptoms are new today and that he came primarily for pain control.  Denies any bowel or bladder incontinence or hesitancy he denies any fevers, chills, numbness saddle anesthesia, weakness in lower extremities or hands.  Does state he has some numbness in his left pinky finger which has been ongoing for 40 years.  Patient does state he has a history of prostate cancer which is not been operated on he states he has had no therapy for this.  Patient denies any night sweats or weight loss.     Past Medical History:  Diagnosis Date   ALLERGIC RHINITIS 01/25/2007   ANXIETY DISORDER, GENERALIZED 01/25/2007   Arthritis    Chronic kidney disease    stage 3   DEPRESSION 01/25/2007   ESOPHAGITIS 12/28/2007   Fatty liver 10/09/2010   GERD 01/25/2007   GLUCOSE INTOLERANCE, HX OF 01/25/2007   Heart murmur    hx of    HEMORRHOIDS, INTERNAL, WITH BLEEDING 04/26/2008   Hiatal hernia    HYPERLIPIDEMIA 12/27/2007   HYPERTENSION, ESSENTIAL NOS 01/25/2007   Hyperthyroidism 08/11/2010   HYPERTROPHY PROSTATE W/UR OBST & OTH LUTS 02/27/2010   Impotence of organic origin  08/05/2009   LOW BACK PAIN, CHRONIC 11/08/2007   Pancreatitis    Prostate cancer ([place])    Rectal abscess    SCHIZOPHRENIA NEC, CHRONIC 01/25/2007   SCOLIOSIS NEC 01/25/2007   Sebaceous cyst 08/10/2010   SMOKER 08/05/2009   UPPER GASTROINTESTINAL HEMORRHAGE 01/25/2007    Patient Active Problem List   Diagnosis Date Noted   Chronic obstructive pulmonary disease ([place]) 10/24/2018   Cervical radiculopathy at C8 01/18/2018   Right lateral epicondylitis 12/21/2017   Pain in joint, shoulder region [id number]   Periumbilical abdominal pain 02/13/2015   Elevated PSA 08/04/2014   Subacromial bursitis 04/11/2014   Neck pain on right side 03/05/2014   Bilateral shoulder pain 03/05/2014   Recurrent boils 03/05/2014   Foreign body in stomach 12/31/2013   Reflux esophagitis 12/31/2013   Weight loss 05/06/2012   Incisional hernia 11/23/2011   Cramp of limb 01/13/2011   Hyperthyroidism 08/11/2010   Sebaceous cyst 08/10/2010   HYPERTROPHY PROSTATE W/UR OBST & OTH LUTS 02/27/2010   SMOKER 08/05/2009   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 Zella Richer   COLONOSCOPY  04/23/2010   normal   ESOPHAGOGASTRODUODENOSCOPY N/A 12/31/2013   Procedure: ESOPHAGOGASTRODUODENOSCOPY (EGD);  Surgeon: Gatha Mayer, MD;  Location: [facility];  Service: Endoscopy;  Laterality: N/A;   Excision of scalp lesion  07/2009   Excision of thyroid mass     GANGLION CYST EXCISION     right foot x 2    HERNIA REPAIR     ventral   INCISIONAL HERNIA REPAIR  12/01/2011   Procedure: LAPAROSCOPIC INCISIONAL HERNIA;  Surgeon: Harl Bowie, MD;  Location: [facility];  Service: General;  Laterality: N/A;   SHOULDER SURGERY     right       Family History  Problem Relation Age of Onset   Hypertension Father    Kidney disease Father    Hypertension Mother    Prostate cancer Other    Colon cancer Neg Hx     Social History   Tobacco Use   Smoking status: Current Every Day Smoker    Packs/day: 2.50    Years: 49.00    Pack years: 122.50    Types: Cigarettes    Start date: 31   Smokeless tobacco: Never Used  Substance Use Topics   Alcohol use: No   Drug use: Yes    Frequency: 14.0 times per week    Types: Marijuana    Home Medications Prior to Admission medications   Medication Sig Start Date End Date Taking? Authorizing Provider  acetaminophen (TYLENOL) 500 MG tablet Take 1,000 mg by mouth every 6 (six) hours as needed for moderate pain.     [provider]  albuterol (VENTOLIN HFA) 108 (90 Base) MCG/ACT inhaler Inhale 2 puffs into the lungs every 6 (six) hours as needed for wheezing or shortness of breath. 11/10/18   Margaretha Seeds, MD  amLODipine (NORVASC) 10 MG tablet Take 1 tablet (10 mg total) by mouth daily. 10/24/18   Marrian Salvage, FNP  Blood Pressure Monitoring (BLOOD PRESSURE CUFF) MISC Use daily as directed to check blood pressure Patient not taking: Reported on 02/28/2019 06/01/18   Marrian Salvage, FNP  cyclobenzaprine (FLEXERIL) 10 MG tablet 1 tablet po qhs 01/01/19   Marrian Salvage, FNP  diclofenac Sodium (VOLTAREN) 1 % GEL Apply 2 g topically 4 (four) times daily. 04/30/19   Tedd Sias, PA  esomeprazole (NEXIUM) 40 MG capsule Take 1 capsule (40 mg total) by mouth daily. 10/24/18   Marrian Salvage, FNP  fluPHENAZine (PROLIXIN) 10 MG tablet Take 2 tablets (20 mg total) by mouth daily. 10/19/17   Marrian Salvage, FNP  fluticasone (FLONASE) 50 MCG/ACT nasal spray Place 2 sprays into both nostrils daily. 10/24/18   Marrian Salvage, FNP  gabapentin (NEURONTIN) 300 MG capsule Take as bedtime as directed 10/24/18   Marrian Salvage, FNP  HYDROcodone-acetaminophen (NORCO/VICODIN) 5-325 MG tablet Take 1 tablet by mouth every 4 (four) hours as needed. 04/30/19   Ramie Erman S, PA  KLOR-CON M20 20 MEQ tablet Take 1 tablet (20 mEq total) by mouth daily. 10/24/18   Marrian Salvage, FNP  losartan (COZAAR) 100 MG tablet Take 1 tablet (100 mg total) by mouth daily. Patient not taking: Reported on 02/28/2019 10/24/18   Marrian Salvage, FNP  naproxen sodium (ALEVE) 220 MG tablet Take 440 mg  by mouth daily as needed (pain).    [provider]  rosuvastatin (CRESTOR) 10 MG tablet Take 1 tablet (10 mg total) by mouth daily. 10/24/18   Marrian Salvage, FNP  SYMBICORT 160-4.5 MCG/ACT inhaler TAKE 2 PUFFS BY MOUTH TWICE A DAY 01/10/19   Marrian Salvage, FNP  tamsulosin (FLOMAX) 0.4 MG CAPS capsule Take 0.4 mg by mouth daily. 09/29/18   [provider]    Allergies    Celecoxib, Iohexol, Metoclopramide hcl, and Wellbutrin [bupropion]  Review of Systems   Review of Systems  Constitutional: Negative for chills and fever.  HENT: Negative for congestion.   Eyes: Negative for pain.  Respiratory: Negative for cough and shortness of breath.   Cardiovascular: Negative for chest pain and leg swelling.  Gastrointestinal: Negative for abdominal pain and vomiting.  Genitourinary: Negative for dysuria.  Musculoskeletal: Positive for back pain, myalgias and neck pain. Negative for neck stiffness.       Shoulder pain bilateral  Skin: Negative for rash.  Neurological: Negative for dizziness and headaches.    Physical Exam Updated Vital Signs BP (!) 149/95 (BP Location: Left Arm)    Pulse 99    Temp 98.3 F (36.8 C) (Oral)    Resp 18    SpO2 100%   Physical Exam Vitals  and nursing note reviewed.  Constitutional:      General: He is not in acute distress.    Comments: Patient 64 year old male appears chronically ill but in no acute distress  HENT:     Head: Normocephalic and atraumatic.     Nose: Nose normal.     Mouth/Throat:     Mouth: Mucous membranes are moist.  Eyes:     General: No scleral icterus. Cardiovascular:     Rate and Rhythm: Normal rate and regular rhythm.     Pulses: Normal pulses.     Heart sounds: Normal heart sounds.     Comments: Bilateral upper and lower extremity pulses 3+. Pulmonary:     Effort: Pulmonary effort is normal. No respiratory distress.     Breath sounds: No wheezing.  Abdominal:     Palpations: Abdomen is soft.     Tenderness: There is no abdominal tenderness.  Musculoskeletal:       Arms:     Cervical back: Normal range of motion. Tenderness present.     Right lower leg: No edema.     Left lower leg: No edema.     Comments: Patient has 5/5 strength in bilateral lower extremities and upper extremities in all joints.  Patient ambulation is within normal limits.  No limitations.  No difficulty with gait with some hesitancy and standing during which time patient questions back but was able to ambulate without pain.  Diffuse midline back pain spanning lumbar and thoracic spine.  Also midline neck tenderness.  No step-off or deformity.  Skin:    General: Skin is warm and dry.     Capillary Refill: Capillary refill takes less than 2 seconds.  Neurological:     Mental Status: He is alert. Mental status is at baseline.     Comments: Patient is ambulatory.  Alert and oriented to self, place, time and event.   Speech is fluent, clear without dysarthria or dysphasia.   Strength 5/5 in upper/lower extremities  Sensation intact in upper/lower extremities   CN I not tested  CN II grossly intact visual fields bilaterally. Did not visualize posterior eye.   CN III, IV, VI PERRLA  and EOMs intact bilaterally  CN V  Intact sensation to sharp and light touch to the face  CN VII facial movements symmetric  CN VIII not tested  CN IX, X no uvula deviation, symmetric rise of soft palate  CN XI 5/5 SCM and trapezius strength bilaterally  CN XII Midline tongue protrusion, symmetric L/R movements   Psychiatric:        Mood and Affect: Mood normal.        Behavior: Behavior normal.     ED Results / Procedures / Treatments   Labs (all labs ordered are listed, but only abnormal results are displayed) Labs Reviewed - No data to display  EKG None  Radiology DG Cervical Spine Complete  Result Date: 04/30/2019 CLINICAL DATA:  Cervicalgia EXAM: CERVICAL SPINE - COMPLETE 4+ VIEW COMPARISON:  None. FINDINGS: Frontal, lateral, open-mouth odontoid, and bilateral oblique views were obtained. There is no fracture or spondylolisthesis. Prevertebral soft tissues and predental space regions are normal. There is moderate disc space narrowing at C6-7. There is slightly milder disc space narrowing at C5-6. Other disc spaces appear unremarkable. There are anterior osteophytes at C5 and to a lesser degree at C6. There is facet hypertrophy with exit foraminal narrowing at C5-6 and C6-7 bilaterally. There is postoperative change in the upper right paratracheal region. There is calcification in each carotid artery. IMPRESSION: Osteoarthritic change at C5-6 and C6-7. No fracture or spondylolisthesis. There are foci of carotid artery calcification bilaterally. Electronically Signed   By: Lowella Grip III M.D.   On: 04/30/2019 13:42   DG Thoracic Spine 2 View  Result Date: 04/30/2019 CLINICAL DATA:  Dorsalgia EXAM: THORACIC SPINE 3 VIEWS COMPARISON:  Chest CT with bony reformats December 11, 2018 FINDINGS: Frontal, lateral, and swimmer's views of the thoracic spine obtained. There is no evident fracture or spondylolisthesis. There is slight disc space narrowing at several levels. No erosive change or paraspinous lesion. Visualized  lungs are clear. IMPRESSION: Slight osteoarthritic change at several levels. No fracture or spondylolisthesis. Electronically Signed   By: Lowella Grip III M.D.   On: 04/30/2019 13:40   DG Lumbar Spine Complete  Result Date: 04/30/2019 CLINICAL DATA:  Low back pain, chronic EXAM: LUMBAR SPINE - COMPLETE 4+ VIEW COMPARISON:  October 04, 2013 FINDINGS: Frontal, lateral, spot lumbosacral lateral, and bilateral oblique views were obtained. There are 5 non-rib-bearing lumbar type vertebral bodies. There is no appreciable fracture or spondylolisthesis. The disc spaces appear unremarkable. No appreciable facet hypertrophy evident. There is aortic atherosclerosis. IMPRESSION: No fracture or spondylolisthesis.  No appreciable arthropathy. Aortic Atherosclerosis (ICD10-I70.0). Electronically Signed   By: Lowella Grip III M.D.   On: 04/30/2019 13:43    Procedures Procedures (including critical care time)  Medications Ordered in ED Medications  oxyCODONE-acetaminophen (PERCOCET/ROXICET) 5-325 MG per tablet 1 tablet (1 tablet Oral Given 04/30/19 1329)    ED Course  I have reviewed the triage vital signs and the nursing notes.  Pertinent labs & imaging results that were available during my care of the patient were reviewed by me and considered in my medical decision making (see chart for details).  Clinical Course as of Apr 29 1414  Mon Apr 30, 2019  1349 DG of L, C, T spine show arthropathy but now acute changes and no evidence of bony mets from prostate.   [WF]    Clinical Course User Index [WF] Tedd Sias, [place]   MDM Rules/Calculators/A&P  Patient with chronic pain in his back with history of radiculopathy presented today with symptoms consistent with his normal pain.  Patient given 1 dose of Percocet in ED.  Physical exam with diffuse muscular tenderness midline back tenderness and neck tenderness.  Patient has history of cancer-prostate cancer that has been  untreated will obtain plain films to assess for mets to spine.  Negative will discharge with short course of Percocet while patient is following up with his orthopedist.  He states he will be able to do this once a week.  PDMP reviewed patient has no recent prescriptions.  Last was over 6 months ago.  Given back exercises and stretches.  Discharged with Voltaren topical gel as well.  I doubt any emergent cause of this patient's back pain  The emergent differential diagnosis for back pain includes but is not limited to fracture, muscle strain, cauda equina, spinal stenosis. DDD, ankylosing spondylitis, acute ligamentous injury, disk herniation, spondylolisthesis, Epidural compression syndrome, metastatic cancer, transverse myelitis, vertebral osteomyelitis, diskitis, kidney stone, pyelonephritis, AAA, Perforated ulcer, Retrocecal appendicitis, pancreatitis, bowel obstruction, retroperitoneal hemorrhage or mass, meningitis.  Most likely acute on chronic back pain.  Patient given strict return precautions.  He is understanding of this plan this time.  He also has good follow-up.   Final Clinical Impression(s) / ED Diagnoses Final diagnoses:  Back pain, unspecified back location, unspecified back pain laterality, unspecified chronicity  Muscle pain  Chronic midline thoracic back pain  Neck pain  Lumbar back pain    Rx / DC Orders ED Discharge Orders         Ordered    HYDROcodone-acetaminophen (NORCO/VICODIN) 5-325 MG tablet  Every 4 hours PRN     04/30/19 1341    diclofenac Sodium (VOLTAREN) 1 % GEL  4 times daily     04/30/19 1413           Pati Gallo [place], [place] 04/30/19 1416    Quintella Reichert, MD 05/01/19 [phone]

## 2019-04-30 NOTE — Discharge Instructions (Addendum)
Please use Voltaren gel as prescribed.  Please use Tylenol for pain.  Please take your medications as prescribed including her blood pressure medication.

## 2019-05-01 DIAGNOSIS — M542 Cervicalgia: Secondary | ICD-10-CM | POA: Diagnosis not present

## 2019-05-01 DIAGNOSIS — M545 Low back pain: Secondary | ICD-10-CM | POA: Diagnosis not present

## 2019-05-08 DIAGNOSIS — M545 Low back pain: Secondary | ICD-10-CM | POA: Diagnosis not present

## 2019-05-08 DIAGNOSIS — M542 Cervicalgia: Secondary | ICD-10-CM | POA: Diagnosis not present

## 2019-05-10 DIAGNOSIS — M545 Low back pain: Secondary | ICD-10-CM | POA: Diagnosis not present

## 2019-05-10 DIAGNOSIS — M5106 Intervertebral disc disorders with myelopathy, lumbar region: Secondary | ICD-10-CM | POA: Diagnosis not present

## 2019-05-10 DIAGNOSIS — M542 Cervicalgia: Secondary | ICD-10-CM | POA: Diagnosis not present

## 2019-05-11 ENCOUNTER — Other Ambulatory Visit: Payer: Self-pay | Admitting: Family

## 2019-05-11 MED ORDER — FLUTICASONE PROPIONATE 50 MCG/ACT NA SUSP: 2.0000 | Freq: Every day | NASAL | 6 refills | Status: DC

## 2019-05-13 ENCOUNTER — Other Ambulatory Visit: Payer: Self-pay | Admitting: Family

## 2019-06-05 DIAGNOSIS — G5622 Lesion of ulnar nerve, left upper limb: Secondary | ICD-10-CM | POA: Diagnosis not present

## 2019-06-05 DIAGNOSIS — M5412 Radiculopathy, cervical region: Secondary | ICD-10-CM | POA: Diagnosis not present

## 2019-06-05 DIAGNOSIS — M542 Cervicalgia: Secondary | ICD-10-CM | POA: Diagnosis not present

## 2019-06-06 ENCOUNTER — Other Ambulatory Visit: Payer: Self-pay

## 2019-06-06 ENCOUNTER — Ambulatory Visit (INDEPENDENT_AMBULATORY_CARE_PROVIDER_SITE_OTHER): Payer: Medicare HMO | Admitting: Family

## 2019-06-06 ENCOUNTER — Encounter: Payer: Self-pay | Admitting: Family

## 2019-06-06 VITALS — BP 146/76 | HR 102 | Temp 98.3°F | Ht 68.7 in | Wt 242.0 lb

## 2019-06-06 DIAGNOSIS — R252 Cramp and spasm: Secondary | ICD-10-CM

## 2019-06-06 DIAGNOSIS — I10 Essential (primary) hypertension: Secondary | ICD-10-CM | POA: Diagnosis not present

## 2019-06-06 DIAGNOSIS — E782 Mixed hyperlipidemia: Secondary | ICD-10-CM | POA: Diagnosis not present

## 2019-06-06 LAB — CBC WITH DIFFERENTIAL/PLATELET
Basophils Absolute: 0 10*3/uL (ref 0.0–0.1)
Basophils Relative: 0.4 % (ref 0.0–3.0)
Eosinophils Absolute: 0.2 10*3/uL (ref 0.0–0.7)
Eosinophils Relative: 1.7 % (ref 0.0–5.0)
HCT: 46.6 % (ref 39.0–52.0)
Hemoglobin: 15.8 g/dL (ref 13.0–17.0)
Lymphocytes Relative: 26.1 % (ref 12.0–46.0)
Lymphs Abs: 2.4 10*3/uL (ref 0.7–4.0)
MCHC: 33.9 g/dL (ref 30.0–36.0)
MCV: 88.2 fl (ref 78.0–100.0)
Monocytes Absolute: 0.8 10*3/uL (ref 0.1–1.0)
Monocytes Relative: 8.7 % (ref 3.0–12.0)
Neutro Abs: 5.9 10*3/uL (ref 1.4–7.7)
Neutrophils Relative %: 63.1 % (ref 43.0–77.0)
Platelets: 262 10*3/uL (ref 150.0–400.0)
RBC: 5.29 Mil/uL (ref 4.22–5.81)
RDW: 14.8 % (ref 11.5–15.5)
WBC: 9.4 10*3/uL (ref 4.0–10.5)

## 2019-06-06 LAB — COMPREHENSIVE METABOLIC PANEL
ALT: 12 U/L (ref 0–53)
AST: 13 U/L (ref 0–37)
Albumin: 4.4 g/dL (ref 3.5–5.2)
Alkaline Phosphatase: 83 U/L (ref 39–117)
BUN: 9 mg/dL (ref 6–23)
CO2: 30 mEq/L (ref 19–32)
Calcium: 10 mg/dL (ref 8.4–10.5)
Chloride: 101 mEq/L (ref 96–112)
Creatinine, Ser: 1.28 mg/dL (ref 0.40–1.50)
GFR: 68.85 mL/min (ref >60.00)
Glucose, Bld: 72 mg/dL (ref 70–99)
Potassium: 4 mEq/L (ref 3.5–5.1)
Sodium: 138 mEq/L (ref 135–145)
Total Bilirubin: 0.5 mg/dL (ref 0.2–1.2)
Total Protein: 7.7 g/dL (ref 6.0–8.3)

## 2019-06-06 LAB — MAGNESIUM: Magnesium: 1.9 mg/dL (ref 1.5–2.5)

## 2019-06-06 LAB — CK: Total CK: 182 U/L (ref 7–232)

## 2019-06-06 LAB — VITAMIN B12: Vitamin B-12: 243 pg/mL (ref 211–911)

## 2019-06-06 MED ORDER — GABAPENTIN 300 MG PO CAPS: ORAL_CAPSULE | ORAL | 3 refills | Status: DC

## 2019-06-06 MED ORDER — ESOMEPRAZOLE MAGNESIUM 40 MG PO CPDR: 40.0000 mg | DELAYED_RELEASE_CAPSULE | Freq: Every day | ORAL | 3 refills | Status: DC

## 2019-06-06 NOTE — Progress Notes (Signed)
[name] is a 64 y.o. male with the following history as recorded in [employer]:  Patient Active Problem List   Diagnosis Date Noted  . Chronic obstructive pulmonary disease ([place]) 10/24/2018  . Cervical radiculopathy at C8 01/18/2018  . Right lateral epicondylitis 12/21/2017  . Pain in joint, shoulder region 04/23/2015  . Periumbilical abdominal pain 02/13/2015  . Elevated PSA 08/04/2014  . Subacromial bursitis 04/11/2014  . Neck pain on right side 03/05/2014  . Bilateral shoulder pain 03/05/2014  . Recurrent boils 03/05/2014  . Foreign body in stomach 12/31/2013  . Reflux esophagitis 12/31/2013  . Weight loss 05/06/2012  . Incisional hernia 11/23/2011  . Cramp of limb 01/13/2011  . Hyperthyroidism 08/11/2010  . Sebaceous cyst 08/10/2010  . HYPERTROPHY PROSTATE W/UR OBST & OTH LUTS 02/27/2010  . SMOKER 08/05/2009  . 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 HEMORRHOIDS, INTERNAL, WITH BLEEDING 04/26/2008  . Hyperlipidemia 12/27/2007  . LOW BACK PAIN, CHRONIC 11/08/2007  . Schizophrenia ([place]) 01/25/2007  . ANXIETY DISORDER, GENERALIZED 01/25/2007  . DEPRESSION 01/25/2007  . Essential hypertension 01/25/2007  . ALLERGIC RHINITIS 01/25/2007  . SCOLIOSIS NEC 01/25/2007  . Impaired glucose tolerance 01/25/2007    Current Outpatient Medications  Medication Sig Dispense Refill  . acetaminophen (TYLENOL) 500 MG tablet Take 1,000 mg by mouth every 6 (six) hours as needed for moderate pain.     [name] albuterol (VENTOLIN HFA) 108 (90 Base) MCG/ACT inhaler Inhale 2 puffs into the lungs every 6 (six) hours as needed for wheezing or shortness of breath. 8 g 6  . amLODipine (NORVASC) 10 MG tablet Take 1 tablet (10 mg total) by mouth daily. 90 tablet 2  . benztropine (COGENTIN) 0.5 MG tablet     . cyclobenzaprine (FLEXERIL) 10 MG tablet TAKE 1 TABLET BY MOUTH EVERYDAY AT BEDTIME 30 tablet 3  . diclofenac Sodium (VOLTAREN) 1 % GEL Apply 2 g topically 4 (four) times daily. 150 g 1  .  esomeprazole (NEXIUM) 40 MG capsule Take 1 capsule (40 mg total) by mouth daily. 90 capsule 3  . fluPHENAZine (PROLIXIN) 10 MG tablet Take 2 tablets (20 mg total) by mouth daily. 60 tablet 0  . fluticasone (FLONASE) 50 MCG/ACT nasal spray Place 2 sprays into both nostrils daily. 16 g 6  . gabapentin (NEURONTIN) 300 MG capsule Take as bedtime as directed 90 capsule 3  . HYDROcodone-acetaminophen (NORCO/VICODIN) 5-325 MG tablet Take 1 tablet by mouth every 4 (four) hours as needed. 8 tablet 0  . KLOR-CON M20 20 MEQ tablet Take 1 tablet (20 mEq total) by mouth daily. 90 tablet 2  . losartan (COZAAR) 100 MG tablet Take 1 tablet (100 mg total) by mouth daily. 90 tablet 2  . naproxen sodium (ALEVE) 220 MG tablet Take 440 mg by mouth daily as needed (pain).    . SYMBICORT 160-4.5 MCG/ACT inhaler TAKE 2 PUFFS BY MOUTH TWICE A DAY 30.6 Inhaler 1  . tamsulosin (FLOMAX) 0.4 MG CAPS capsule Take 0.4 mg by mouth daily.    [name] VALIUM 10 MG tablet Take 10 mg by mouth as directed.    . Blood Pressure Monitoring (BLOOD PRESSURE CUFF) MISC Use daily as directed to check blood pressure (Patient not taking: Reported on 02/28/2019) 1 each 0  . PERCOCET 10-325 MG tablet Take 1 tablet by mouth.    . rosuvastatin (CRESTOR) 10 MG tablet Take 1 tablet (10 mg total) by mouth daily. (Patient not taking: Reported on 06/06/2019) 90 tablet 1   No current facility-administered  medications for this visit.    Allergies: Celecoxib, Iohexol, Metoclopramide hcl, and Wellbutrin [bupropion]  Past Medical History:  Diagnosis Date  . ALLERGIC RHINITIS 01/25/2007  . ANXIETY DISORDER, GENERALIZED 01/25/2007  . Arthritis   . Chronic kidney disease    stage 3  . DEPRESSION 01/25/2007  . ESOPHAGITIS 12/28/2007  . Fatty liver 10/09/2010  . GERD 01/25/2007  . GLUCOSE INTOLERANCE, HX OF 01/25/2007  . Heart murmur    hx of   . HEMORRHOIDS, INTERNAL, WITH BLEEDING 04/26/2008  . Hiatal hernia   . HYPERLIPIDEMIA 12/27/2007  . HYPERTENSION,  ESSENTIAL NOS 01/25/2007  . Hyperthyroidism 08/11/2010  . HYPERTROPHY PROSTATE W/UR OBST & OTH LUTS 02/27/2010  . 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 LOW BACK PAIN, CHRONIC 11/08/2007  . Pancreatitis   . Prostate cancer ([place])   . Rectal abscess   . 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 SCOLIOSIS NEC 01/25/2007  . Sebaceous cyst 08/10/2010  . SMOKER 08/05/2009  . UPPER GASTROINTESTINAL HEMORRHAGE 01/25/2007    Past Surgical History:  Procedure Laterality Date  . ABDOMINAL EXPLORATION SURGERY  1988  . CHOLECYSTECTOMY  05/20/08   Dr. Zella Richer  . COLONOSCOPY  04/23/2010   normal  . ESOPHAGOGASTRODUODENOSCOPY N/A 12/31/2013   Procedure: ESOPHAGOGASTRODUODENOSCOPY (EGD);  Surgeon: Gatha Mayer, MD;  Location: [facility];  Service: Endoscopy;  Laterality: N/A;  . Excision of scalp lesion  07/2009  . Excision of thyroid mass    . GANGLION CYST EXCISION     right foot x 2  . HERNIA REPAIR     ventral  . INCISIONAL HERNIA REPAIR  12/01/2011   Procedure: LAPAROSCOPIC INCISIONAL HERNIA;  Surgeon: Harl Bowie, MD;  Location: [facility];  Service: General;  Laterality: N/A;  . SHOULDER SURGERY     right    Family History  Problem Relation Age of Onset  . Hypertension Father   . Kidney disease Father   . Hypertension Mother   . Prostate cancer Other   . Colon cancer Neg Hx     Social History   Tobacco Use  . Smoking status: Current Every Day Smoker    Packs/day: 2.50    Years: 49.00    Pack years: 122.50    Types: Cigarettes    Start date: 31  . Smokeless tobacco: Never Used  Substance Use Topics  . Alcohol use: No    Subjective:  Patient presents to follow up on chronic care needs:  1) Hypertension- has not taken his medication today;  2) CKD- stage 3; under care of nephrology;  3) Tobacco Abuse- down to 1 ppd from 2 pdd; 4) Saw neurosurgeon yesterday- will most likely need surgery on his neck; scheduled for nerve conduction in early March- neurosurgeon is concerned  about muscle loss in the left hand 5) Muscle cramps- does think he has been taking his Crestor daily;   Objective:  Vitals:   06/06/19 0931  BP: (!) 146/76  Pulse: (!) 102  Temp: 98.3 F (36.8 C)  TempSrc: Oral  SpO2: 99%  Weight: 242 lb (109.8 kg)  Height: 5' 8.7" (1.745 m)    General: Well developed, well nourished, in no acute distress  Skin : Warm and dry.  Head: Normocephalic and atraumatic  Lungs: Respirations unlabored; clear to auscultation bilaterally without wheeze, rales, rhonchi  CVS exam: normal rate and regular rhythm.  Neurologic: Alert and oriented; speech intact; face symmetrical; moves all extremities well; CNII-XII intact without focal deficit   Assessment:  1.  Muscle cramps   2. Essential hypertension   3. Mixed hyperlipidemia     Plan:  1. ? Related to Crestor; hold medication for now; update labs; follow-up to be determined; 2. Patient not taking medication regularly- take medications as prescribed; 3. Hold Crestor for now;  This visit occurred during the SARS-CoV-2 public health emergency.  Safety protocols were in place, including screening questions prior to the visit, additional usage of staff PPE, and extensive cleaning of exam room while observing appropriate contact time as indicated for disinfecting solutions.      No follow-ups on file.  Orders Placed This Encounter  Procedures  . CBC w/Diff  . Comp Met (CMET)  . Magnesium  . B12  . CK (Creatine Kinase)    Requested Prescriptions   Signed Prescriptions Disp Refills  . esomeprazole (NEXIUM) 40 MG capsule 90 capsule 3    Sig: Take 1 capsule (40 mg total) by mouth daily.  [name] gabapentin (NEURONTIN) 300 MG capsule 90 capsule 3    Sig: Take as bedtime as directed

## 2019-06-06 NOTE — Patient Instructions (Signed)
Hold the Rosuvastatin for now; let's see what your labs show and we will decide what to do about the medication;

## 2019-06-22 DIAGNOSIS — G5622 Lesion of ulnar nerve, left upper limb: Secondary | ICD-10-CM | POA: Diagnosis not present

## 2019-06-22 DIAGNOSIS — R200 Anesthesia of skin: Secondary | ICD-10-CM | POA: Insufficient documentation

## 2019-06-22 DIAGNOSIS — M5412 Radiculopathy, cervical region: Secondary | ICD-10-CM | POA: Diagnosis not present

## 2019-06-22 DIAGNOSIS — M4692 Unspecified inflammatory spondylopathy, cervical region: Secondary | ICD-10-CM | POA: Diagnosis not present

## 2019-06-26 DIAGNOSIS — Z6835 Body mass index (BMI) 35.0-35.9, adult: Secondary | ICD-10-CM | POA: Diagnosis not present

## 2019-06-26 DIAGNOSIS — G5622 Lesion of ulnar nerve, left upper limb: Secondary | ICD-10-CM | POA: Diagnosis not present

## 2019-07-02 DIAGNOSIS — G5622 Lesion of ulnar nerve, left upper limb: Secondary | ICD-10-CM | POA: Diagnosis not present

## 2019-07-04 ENCOUNTER — Other Ambulatory Visit: Payer: Self-pay | Admitting: Family

## 2019-07-09 DIAGNOSIS — C61 Malignant neoplasm of prostate: Secondary | ICD-10-CM | POA: Diagnosis not present

## 2019-07-16 DIAGNOSIS — C61 Malignant neoplasm of prostate: Secondary | ICD-10-CM | POA: Diagnosis not present

## 2019-07-16 DIAGNOSIS — N5201 Erectile dysfunction due to arterial insufficiency: Secondary | ICD-10-CM | POA: Diagnosis not present

## 2019-07-16 DIAGNOSIS — R3912 Poor urinary stream: Secondary | ICD-10-CM | POA: Diagnosis not present

## 2019-07-19 ENCOUNTER — Telehealth: Payer: Self-pay | Admitting: Family

## 2019-07-19 NOTE — Telephone Encounter (Signed)
   Patient spouse calling to request prescription for blood pressure monitor

## 2019-07-24 ENCOUNTER — Other Ambulatory Visit: Payer: Self-pay | Admitting: Family

## 2019-07-24 DIAGNOSIS — I10 Essential (primary) hypertension: Secondary | ICD-10-CM

## 2019-07-24 DIAGNOSIS — Z6836 Body mass index (BMI) 36.0-36.9, adult: Secondary | ICD-10-CM | POA: Diagnosis not present

## 2019-07-24 DIAGNOSIS — F200 Paranoid schizophrenia: Secondary | ICD-10-CM | POA: Diagnosis not present

## 2019-07-24 NOTE — Telephone Encounter (Signed)
Spoke with Mrs. Laurance Flatten today and order mailed out per her request.

## 2019-07-24 NOTE — Telephone Encounter (Signed)
Order on your desk

## 2019-08-07 DIAGNOSIS — N183 Chronic kidney disease, stage 3 unspecified: Secondary | ICD-10-CM | POA: Diagnosis not present

## 2019-08-08 DIAGNOSIS — Z720 Tobacco use: Secondary | ICD-10-CM | POA: Diagnosis not present

## 2019-08-08 DIAGNOSIS — E559 Vitamin D deficiency, unspecified: Secondary | ICD-10-CM | POA: Diagnosis not present

## 2019-08-08 DIAGNOSIS — I129 Hypertensive chronic kidney disease with stage 1 through stage 4 chronic kidney disease, or unspecified chronic kidney disease: Secondary | ICD-10-CM | POA: Diagnosis not present

## 2019-08-08 DIAGNOSIS — N1831 Chronic kidney disease, stage 3a: Secondary | ICD-10-CM | POA: Diagnosis not present

## 2019-08-22 ENCOUNTER — Other Ambulatory Visit: Payer: Self-pay | Admitting: Family

## 2019-08-22 MED ORDER — KLOR-CON M20 20 MEQ PO TBCR: 20.0000 meq | EXTENDED_RELEASE_TABLET | Freq: Every day | ORAL | 2 refills | Status: DC

## 2019-09-12 ENCOUNTER — Other Ambulatory Visit: Payer: Self-pay | Admitting: Family

## 2019-09-15 ENCOUNTER — Emergency Department (HOSPITAL_COMMUNITY): Payer: Medicare HMO

## 2019-09-15 ENCOUNTER — Emergency Department (HOSPITAL_COMMUNITY)
Admission: EM | Admit: 2019-09-15 | Discharge: 2019-09-15 | Disposition: A | Discharge status: Home / Self Care | Payer: Medicare HMO | Attending: Emergency Medicine | Admitting: Emergency Medicine

## 2019-09-15 ENCOUNTER — Other Ambulatory Visit: Payer: Self-pay

## 2019-09-15 DIAGNOSIS — Z8546 Personal history of malignant neoplasm of prostate: Secondary | ICD-10-CM | POA: Diagnosis not present

## 2019-09-15 DIAGNOSIS — N183 Chronic kidney disease, stage 3 unspecified: Secondary | ICD-10-CM | POA: Diagnosis not present

## 2019-09-15 DIAGNOSIS — M25522 Pain in left elbow: Secondary | ICD-10-CM | POA: Diagnosis not present

## 2019-09-15 DIAGNOSIS — I10 Essential (primary) hypertension: Secondary | ICD-10-CM | POA: Diagnosis not present

## 2019-09-15 DIAGNOSIS — M7989 Other specified soft tissue disorders: Secondary | ICD-10-CM | POA: Diagnosis not present

## 2019-09-15 DIAGNOSIS — F17210 Nicotine dependence, cigarettes, uncomplicated: Secondary | ICD-10-CM | POA: Insufficient documentation

## 2019-09-15 DIAGNOSIS — Z79899 Other long term (current) drug therapy: Secondary | ICD-10-CM | POA: Diagnosis not present

## 2019-09-15 DIAGNOSIS — L905 Scar conditions and fibrosis of skin: Secondary | ICD-10-CM

## 2019-09-15 DIAGNOSIS — R600 Localized edema: Secondary | ICD-10-CM | POA: Diagnosis not present

## 2019-09-15 DIAGNOSIS — R609 Edema, unspecified: Secondary | ICD-10-CM

## 2019-09-15 DIAGNOSIS — I129 Hypertensive chronic kidney disease with stage 1 through stage 4 chronic kidney disease, or unspecified chronic kidney disease: Secondary | ICD-10-CM | POA: Diagnosis not present

## 2019-09-15 LAB — CBC WITH DIFFERENTIAL/PLATELET
Abs Immature Granulocytes: 0.02 10*3/uL (ref 0.00–0.07)
Basophils Absolute: 0 10*3/uL (ref 0.0–0.1)
Basophils Relative: 1 %
Eosinophils Absolute: 0.1 10*3/uL (ref 0.0–0.5)
Eosinophils Relative: 2 %
HCT: 41.5 % (ref 39.0–52.0)
Hemoglobin: 14.2 g/dL (ref 13.0–17.0)
Immature Granulocytes: 0 %
Lymphocytes Relative: 23 %
Lymphs Abs: 1.8 10*3/uL (ref 0.7–4.0)
MCH: 30.2 pg (ref 26.0–34.0)
MCHC: 34.2 g/dL (ref 30.0–36.0)
MCV: 88.3 fL (ref 80.0–100.0)
Monocytes Absolute: 0.9 10*3/uL (ref 0.1–1.0)
Monocytes Relative: 11 %
Neutro Abs: 5.1 10*3/uL (ref 1.7–7.7)
Neutrophils Relative %: 63 %
Platelets: 273 10*3/uL (ref 150–400)
RBC: 4.7 MIL/uL (ref 4.22–5.81)
RDW: 14.2 % (ref 11.5–15.5)
WBC: 8 10*3/uL (ref 4.0–10.5)
nRBC: 0 % (ref 0.0–0.2)

## 2019-09-15 LAB — COMPREHENSIVE METABOLIC PANEL
ALT: 13 U/L (ref 0–44)
AST: 19 U/L (ref 15–41)
Albumin: 3.6 g/dL (ref 3.5–5.0)
Alkaline Phosphatase: 69 U/L (ref 38–126)
Anion gap: 9 (ref 5–15)
BUN: 8 mg/dL (ref 8–23)
CO2: 27 mmol/L (ref 22–32)
Calcium: 8.7 mg/dL — ABNORMAL LOW (ref 8.9–10.3)
Chloride: 104 mmol/L (ref 98–111)
Creatinine, Ser: 1.12 mg/dL (ref 0.61–1.24)
GFR calc Af Amer: >60 mL/min (ref >60)
GFR calc non Af Amer: >60 mL/min (ref >60)
Glucose, Bld: 100 mg/dL — ABNORMAL HIGH (ref 70–99)
Potassium: 3.8 mmol/L (ref 3.5–5.1)
Sodium: 140 mmol/L (ref 135–145)
Total Bilirubin: 0.7 mg/dL (ref 0.3–1.2)
Total Protein: 6.8 g/dL (ref 6.5–8.1)

## 2019-09-15 NOTE — ED Triage Notes (Signed)
Patient states his right arm and leg started swelling yesterday. Patient says he thinks something could have bitten him. Patient also states his pcp took him off of his diuretic about a year ago. Denies pain

## 2019-09-15 NOTE — Discharge Instructions (Addendum)
You were seen today for leg swelling and elbow swelling.  Your elbow swelling is most likely due to from your surgical scar changes from your most recent surgery.  I want you to come back if you have any overlying warmth redness, fever, chills, pain.  Your leg swelling could be due to a lot of causes.  I want you to follow-up with your PCP about this likely spoke about.  Come back if you have any recurrent swelling, calf tenderness, shortness of breath, chest pain.  All of your labs and chest x-ray and elbow x-ray were reassuring.  Your blood pressure was elevated in the emergency department today as we spoke about.  I want you did talk to your PCP about this as well.  Come back if you have any symptoms as we spoke about that are related to your blood pressure.  Make sure to take your blood pressure medications as directed.

## 2019-09-15 NOTE — ED Provider Notes (Signed)
[facility] DEPT Provider Note   CSN: [id number] Arrival date & time: 09/15/19  0759     History Chief Complaint  Patient presents with  . Leg Swelling  . Arm Swelling    [name] is a 64 y.o. male with pertinent past medical history of chronic kidney disease stage III, hyperlipidemia, hypertension, anxiety that presents to the emergency department today for 2 complaints.  States that he has foot swelling, bilaterally but more on right side that started last night.  Patient states that after he slept the swelling went down.  States that this is not happened before since he was taken off his fluid pill.  Patient states that he was taken off his diuretic last year due to electrolyte imbalances by his nephrologist.  States that he is pretty sedentary, does not get a lot of exercise at home.  States that after he rested his feet, swelling resolved.  No tenderness to calf, paresthesias in leg or foot, trauma to ankle or foot, fevers, chills, chest pain or shortness of breath, nausea, vomiting, abdominal pain.    In addition patient states that he has swelling on his left elbow he had surgery about a month and a half ago for what it seems to be ulnar nerve release.  States that about 2 weeks ago noticed a lump on his elbow that has been increasing in size.  States that he saw his surgeon 2 days ago for this who stated that it was scar tissue.  Wanted this reevaluated.  Denies any injuries to that area, overlying erythema, pain or radiation down the arm.  Patient states that his pinky is numb, this was there before the surgery.  No other paresthesias.  No weakness.  HPI     Past Medical History:  Diagnosis Date  . ALLERGIC RHINITIS 01/25/2007  . ANXIETY DISORDER, GENERALIZED 01/25/2007  . Arthritis   . Chronic kidney disease    stage 3  . DEPRESSION 01/25/2007  . ESOPHAGITIS 12/28/2007  . Fatty liver 10/09/2010  . GERD 01/25/2007  . GLUCOSE INTOLERANCE, HX OF  01/25/2007  . Heart murmur    hx of   . HEMORRHOIDS, INTERNAL, WITH BLEEDING 04/26/2008  . Hiatal hernia   . HYPERLIPIDEMIA 12/27/2007  . HYPERTENSION, ESSENTIAL NOS 01/25/2007  . Hyperthyroidism 08/11/2010  . HYPERTROPHY PROSTATE W/UR OBST & OTH LUTS 02/27/2010  . 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 LOW BACK PAIN, CHRONIC 11/08/2007  . Pancreatitis   . Prostate cancer ([place])   . Rectal abscess   . 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 SCOLIOSIS NEC 01/25/2007  . Sebaceous cyst 08/10/2010  . SMOKER 08/05/2009  . UPPER GASTROINTESTINAL HEMORRHAGE 01/25/2007    Patient Active Problem List   Diagnosis Date Noted  . Chronic obstructive pulmonary disease ([place]) 10/24/2018  . Cervical radiculopathy at C8 01/18/2018  . Right lateral epicondylitis 12/21/2017  . Pain in joint, shoulder region 04/23/2015  . Periumbilical abdominal pain 02/13/2015  . Elevated PSA 08/04/2014  . Subacromial bursitis 04/11/2014  . Neck pain on right side 03/05/2014  . Bilateral shoulder pain 03/05/2014  . Recurrent boils 03/05/2014  . Foreign body in stomach 12/31/2013  . Reflux esophagitis 12/31/2013  . Weight loss 05/06/2012  . Incisional hernia 11/23/2011  . Cramp of limb 01/13/2011  . Hyperthyroidism 08/11/2010  . Sebaceous cyst 08/10/2010  . HYPERTROPHY PROSTATE W/UR OBST & OTH LUTS 02/27/2010  . SMOKER 08/05/2009  . 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 HEMORRHOIDS, INTERNAL,  WITH BLEEDING 04/26/2008  . Hyperlipidemia 12/27/2007  . LOW BACK PAIN, CHRONIC 11/08/2007  . Schizophrenia ([place]) 01/25/2007  . ANXIETY DISORDER, GENERALIZED 01/25/2007  . DEPRESSION 01/25/2007  . Essential hypertension 01/25/2007  . ALLERGIC RHINITIS 01/25/2007  . SCOLIOSIS NEC 01/25/2007  . Impaired glucose tolerance 01/25/2007    Past Surgical History:  Procedure Laterality Date  . ABDOMINAL EXPLORATION SURGERY  1988  . CHOLECYSTECTOMY  05/20/08   Dr. Zella Richer  . COLONOSCOPY  04/23/2010   normal  .  ESOPHAGOGASTRODUODENOSCOPY N/A 12/31/2013   Procedure: ESOPHAGOGASTRODUODENOSCOPY (EGD);  Surgeon: Gatha Mayer, MD;  Location: [facility];  Service: Endoscopy;  Laterality: N/A;  . Excision of scalp lesion  07/2009  . Excision of thyroid mass    . GANGLION CYST EXCISION     right foot x 2  . HERNIA REPAIR     ventral  . INCISIONAL HERNIA REPAIR  12/01/2011   Procedure: LAPAROSCOPIC INCISIONAL HERNIA;  Surgeon: Harl Bowie, MD;  Location: [facility];  Service: General;  Laterality: N/A;  . SHOULDER SURGERY     right       Family History  Problem Relation Age of Onset  . Hypertension Father   . Kidney disease Father   . Hypertension Mother   . Prostate cancer Other   . Colon cancer Neg Hx     Social History   Tobacco Use  . Smoking status: Current Every Day Smoker    Packs/day: 2.50    Years: 49.00    Pack years: 122.50    Types: Cigarettes    Start date: 49  . Smokeless tobacco: Never Used  Substance Use Topics  . Alcohol use: No  . Drug use: Yes    Frequency: 14.0 times per week    Types: Marijuana    Home Medications Prior to Admission medications   Medication Sig Start Date End Date Taking? Authorizing Provider  acetaminophen (TYLENOL) 500 MG tablet Take 1,000 mg by mouth every 6 (six) hours as needed for moderate pain.    Yes [provider]  albuterol (VENTOLIN HFA) 108 (90 Base) MCG/ACT inhaler Inhale 2 puffs into the lungs every 6 (six) hours as needed for wheezing or shortness of breath. 11/10/18  Yes Margaretha Seeds, MD  amLODipine (NORVASC) 10 MG tablet Take 1 tablet (10 mg total) by mouth daily. 10/24/18  Yes Marrian Salvage, FNP  benztropine (COGENTIN) 0.5 MG tablet Take 0.5 mg by mouth 2 (two) times daily.  05/10/19  Yes [provider]  cyclobenzaprine (FLEXERIL) 10 MG tablet TAKE 1 TABLET BY MOUTH EVERYDAY AT BEDTIME Patient taking differently: Take 10 mg by mouth at bedtime.  05/14/19  Yes Marrian Salvage, FNP    diclofenac Sodium (VOLTAREN) 1 % GEL Apply 2 g topically 4 (four) times daily. Patient taking differently: Apply 2 g topically 4 (four) times daily as needed (pain).  04/30/19  Yes Fondaw, Wylder S, PA  esomeprazole (NEXIUM) 40 MG capsule Take 1 capsule (40 mg total) by mouth daily. 06/06/19  Yes Marrian Salvage, FNP  fluPHENAZine (PROLIXIN) 10 MG tablet Take 2 tablets (20 mg total) by mouth daily. 10/19/17  Yes Marrian Salvage, FNP  fluticasone [facility]) 50 MCG/ACT nasal spray Place 2 sprays into both nostrils daily. 05/11/19  Yes Marrian Salvage, FNP  gabapentin (NEURONTIN) 300 MG capsule Take as bedtime as directed Patient taking differently: Take 300 mg by mouth at bedtime. Take as bedtime as directed 06/06/19  Yes Marrian Salvage,  FNP  KLOR-CON M20 20 MEQ tablet Take 1 tablet (20 mEq total) by mouth daily. 08/22/19  Yes Marrian Salvage, FNP  losartan (COZAAR) 100 MG tablet TAKE 1 TABLET BY MOUTH EVERY DAY Patient taking differently: Take 100 mg by mouth daily.  09/12/19  Yes Marrian Salvage, FNP  naproxen sodium (ALEVE) 220 MG tablet Take 440 mg by mouth daily as needed (pain).   Yes [provider]  oxyCODONE-acetaminophen (PERCOCET/ROXICET) 5-325 MG tablet Take 1 tablet by mouth every 6 (six) hours as needed for pain. 08/23/19  Yes [provider]  SYMBICORT 160-4.5 MCG/ACT inhaler TAKE 2 PUFFS BY MOUTH TWICE A DAY Patient taking differently: Inhale 2 puffs into the lungs in the morning and at bedtime.  07/04/19  Yes Marrian Salvage, FNP  tamsulosin (FLOMAX) 0.4 MG CAPS capsule Take 0.4 mg by mouth daily. 09/29/18  Yes [provider]  Blood Pressure Monitoring (BLOOD PRESSURE CUFF) MISC Use daily as directed to check blood pressure Patient not taking: Reported on 02/28/2019 06/01/18   Marrian Salvage, FNP    Allergies    Celecoxib, Iohexol, Metoclopramide hcl, and Wellbutrin [bupropion]  Review of Systems   Review  of Systems  Constitutional: Negative for chills, diaphoresis, fatigue and fever.  HENT: Negative for congestion, sore throat and trouble swallowing.   Eyes: Negative for pain and visual disturbance.  Respiratory: Negative for cough, shortness of breath and wheezing.   Cardiovascular: Positive for leg swelling (subjective, bilaterally more on R). Negative for chest pain and palpitations.  Gastrointestinal: Negative for abdominal distention, abdominal pain, diarrhea, nausea and vomiting.  Genitourinary: Negative for difficulty urinating.  Musculoskeletal: Positive for joint swelling (L elbow). Negative for back pain, neck pain and neck stiffness.  Skin: Negative for pallor.  Neurological: Negative for dizziness, speech difficulty, weakness and headaches.  Psychiatric/Behavioral: Negative for confusion.    Physical Exam Updated Vital Signs BP (!) 155/107   Pulse 95   Temp 97.9 F (36.6 C) (Oral)   Resp 18   Ht 5\' 8"  (1.727 m)   Wt 113.4 kg   SpO2 95%   BMI 38.01 kg/m   Physical Exam Constitutional:      General: He is not in acute distress.    Appearance: Normal appearance. He is not ill-appearing, toxic-appearing or diaphoretic.  HENT:     Mouth/Throat:     Mouth: Mucous membranes are moist.     Pharynx: Oropharynx is clear.  Eyes:     General: No scleral icterus.    Extraocular Movements: Extraocular movements intact.     Pupils: Pupils are equal, round, and reactive to light.  Cardiovascular:     Rate and Rhythm: Normal rate and regular rhythm.     Pulses: Normal pulses.     Heart sounds: Normal heart sounds.  Pulmonary:     Effort: Pulmonary effort is normal. No respiratory distress.     Breath sounds: Normal breath sounds. No stridor. No wheezing, rhonchi or rales.  Chest:     Chest wall: No tenderness.  Abdominal:     General: Abdomen is flat. There is no distension.     Palpations: Abdomen is soft.     Tenderness: There is no abdominal tenderness. There is no  guarding or rebound.  Musculoskeletal:        General: No swelling or tenderness. Normal range of motion.     Right elbow: Normal. No swelling.     Left elbow: Swelling (Swelling medially to elbow, where  incision was made for surgery.  Overlying scar noted over swelling.  Is not warm or tender.  Patient is able to move arm in all directions.  Normal sensation.  Normal cap refill.  Distal distal pulses intact.) present.     Cervical back: Normal range of motion and neck supple. No rigidity.     Right lower leg: No tenderness or bony tenderness. No edema.     Left lower leg: No tenderness or bony tenderness. No edema.     Comments: Patient with no overlying edema on right or left foot or calf.  No tenderness to right calf.  No overlying skin changes.  Pulses intact.  Patient is able to move ankle and leg in all directions.  Normal sensation in toes.  Skin:    General: Skin is warm and dry.     Capillary Refill: Capillary refill takes less than 2 seconds.     Coloration: Skin is not pale.  Neurological:     General: No focal deficit present.     Mental Status: He is alert and oriented to person, place, and time.  Psychiatric:        Mood and Affect: Mood normal.        Behavior: Behavior normal.     ED Results / Procedures / Treatments   Labs (all labs ordered are listed, but only abnormal results are displayed) Labs Reviewed  COMPREHENSIVE METABOLIC PANEL - Abnormal; Notable for the following components:      Result Value   Glucose, Bld 100 (*)    Calcium 8.7 (*)    All other components within normal limits  CBC WITH DIFFERENTIAL/PLATELET    EKG None  Radiology DG Elbow 2 Views Left  Result Date: 09/15/2019 CLINICAL DATA:  Patient states his right arm and leg started swelling yesterday. Patient says he thinks something could have bitten him. Pain medial side of left elbow. Pt. Stated he had surgery on left elbow 06/2019. elbow swelling EXAM: LEFT ELBOW - 2 VIEW COMPARISON:  None.  FINDINGS: No evidence of fracture of the ulna or humerus. The radial head is normal. No joint effusion. No vascular cast scratch the vascular calcifications in the soft tissue. No foreign body identified. IMPRESSION: 1.  No acute osseous abnormality. 2. No foreign body identified. Electronically Signed   By: Suzy Bouchard M.D.   On: 09/15/2019 10:42   DG Chest Portable 1 View  Result Date: 09/15/2019 CLINICAL DATA:  Leg swelling. EXAM: PORTABLE CHEST 1 VIEW COMPARISON:  04/12/2018 FINDINGS: Single view of the chest demonstrates clear lungs. Heart and mediastinum are within normal limits. Trachea is midline. Negative for a pneumothorax. No acute bone abnormality. IMPRESSION: No acute cardiopulmonary disease. Electronically Signed   By: Markus Daft M.D.   On: 09/15/2019 10:37    Procedures Procedures (including critical care time)  Medications Ordered in ED Medications - No data to display  ED Course  I have reviewed the triage vital signs and the nursing notes.  Pertinent labs & imaging results that were available during my care of the patient were reviewed by me and considered in my medical decision making (see chart for details).    MDM Rules/Calculators/A&P                     Michaelangelo Easterly is a 64 y.o. male with pertinent past medical history of chronic kidney disease stage III, hyperlipidemia, hypertension, anxiety that presents to the emergency department today for bilateral leg swelling,  worse on right and left elbow swelling.  Patient with left elbow swelling medially to elbow joint.  No overlying erythema.  No pain.  Patient is able to move elbow in all directions.  No paresthesias down the arm.  Swelling is over surgical scar where patient got surgery 1 month ago.  Surgeon told him 2 days ago that this is from scar tissue.  Will get x-ray at this time. Patient is not in pain. No signs of septic joint.   Bilateral leg edema, worse on right.  Patient without edema today.  Wells DVT  score 0, does not clinically look like he has a DVT at this time.  Patient without any swelling, tenderness, pitting edema, no overlying skin changes.  Most likely dependent edema from sedentary lifestyle, edema resolved after patient rested and elevated feet.  This would not occur if this was a DVT.  We will not get DVT testing at this time.  Denies any shortness of breath, chest pain, vital signs are stable. Discussed option of getting ultrasound study due to unilateral leg swelling, patient and wife states that they would rather do this outpatient if it happens again.  Labs stable, CXR negative, Elbow xray negative. EKG sinus. Pt to be discharged home with close PCP follow up.   Doubt need for further emergent work up at this time. I explained the diagnosis and have given explicit precautions to return to the ER including for any other new or worsening symptoms. The patient understands and accepts the medical plan as it's been dictated and I have answered their questions. Discharge instructions concerning home care and prescriptions have been given. The patient is STABLE and is discharged to home in good condition.  .Patient's blood pressure elevated in the emergency department today. Patient denies headache, change in vision, numbness, weakness, chest pain, dyspnea, dizziness, or lightheadedness therefore doubt hypertensive emergency. Discussed elevated blood pressure with the patient and the need for primary care follow up with potential need to initiate or change antihypertensive medications and or for further evaluation. Discussed return precaution signs/symptoms for hypertensive emergency as listed above with the patient. He/she confirmed understanding.    I discussed this case with my attending physician who cosigned this note including patient's presenting symptoms, physical exam, and planned diagnostics and interventions. Attending physician stated agreement with plan or made changes to plan which  were implemented.   Attending physician assessed patient at bedside.   Final Clinical Impression(s) / ED Diagnoses Final diagnoses:  Dependent edema  Scar tissue    Rx / DC Orders ED Discharge Orders    None       [name] Client, PA-C 09/15/19 2025    Wyvonnia Dusky, MD 09/16/19 1336

## 2019-09-18 ENCOUNTER — Telehealth: Payer: Self-pay | Admitting: Family

## 2019-09-18 DIAGNOSIS — C61 Malignant neoplasm of prostate: Secondary | ICD-10-CM

## 2019-09-18 NOTE — Telephone Encounter (Signed)
Spoke with patient today and info given. 

## 2019-09-18 NOTE — Telephone Encounter (Signed)
     Spouse calling on behalf of patient to request referral to [facility]. Patient was diagnosed with prostate cancer by [facility] per spouse They are seeking a second opinion

## 2019-09-18 NOTE — Telephone Encounter (Signed)
Prostate cancer is managed by urology typically. I can put in the referral as they are requesting but the [facility] may push back on the referral.

## 2019-09-21 NOTE — Telephone Encounter (Signed)
Spoke with patient's wife today. Info has been given. Fax number given of [phone].

## 2019-09-26 DIAGNOSIS — Z6835 Body mass index (BMI) 35.0-35.9, adult: Secondary | ICD-10-CM | POA: Diagnosis not present

## 2019-09-26 DIAGNOSIS — M47812 Spondylosis without myelopathy or radiculopathy, cervical region: Secondary | ICD-10-CM | POA: Insufficient documentation

## 2019-09-26 DIAGNOSIS — I10 Essential (primary) hypertension: Secondary | ICD-10-CM | POA: Diagnosis not present

## 2019-09-26 DIAGNOSIS — M542 Cervicalgia: Secondary | ICD-10-CM | POA: Diagnosis not present

## 2019-10-04 DIAGNOSIS — M47812 Spondylosis without myelopathy or radiculopathy, cervical region: Secondary | ICD-10-CM | POA: Diagnosis not present

## 2019-10-05 ENCOUNTER — Encounter: Payer: Self-pay | Admitting: Medical Oncology

## 2019-10-08 NOTE — Progress Notes (Signed)
Patient referred by primary care for 2nd opinion for prostate cancer treat. Per Dr. Alen Blew, he feels patient would best served by attending the [facility]. I spoke with patient and his wife, to introduce myself as the prostate nurse navigator and discussed my role. I discussed the [facility] and suggested attending the clinic for 2nd opinion. They both agree and he will  be scheduled 7/9, arriving at 8 am. I will mail him a packet of information about the clinic and some  medical forms to be completed. I confirmed his mailing address. Dr. Alen Blew notified of the above.

## 2019-10-12 ENCOUNTER — Other Ambulatory Visit: Payer: Self-pay

## 2019-10-12 ENCOUNTER — Ambulatory Visit (INDEPENDENT_AMBULATORY_CARE_PROVIDER_SITE_OTHER): Payer: Medicare HMO | Admitting: Family

## 2019-10-12 ENCOUNTER — Encounter: Payer: Self-pay | Admitting: Family

## 2019-10-12 VITALS — BP 140/80 | HR 94 | Temp 98.6°F | Wt 245.6 lb

## 2019-10-12 DIAGNOSIS — Z720 Tobacco use: Secondary | ICD-10-CM | POA: Diagnosis not present

## 2019-10-12 DIAGNOSIS — R0989 Other specified symptoms and signs involving the circulatory and respiratory systems: Secondary | ICD-10-CM

## 2019-10-12 DIAGNOSIS — C61 Malignant neoplasm of prostate: Secondary | ICD-10-CM | POA: Diagnosis not present

## 2019-10-12 NOTE — Progress Notes (Signed)
[name] is a 64 y.o. male with the following history as recorded in EpicCare:  Patient Active Problem List   Diagnosis Date Noted  . Chronic obstructive pulmonary disease ([place]) 10/24/2018  . Cervical radiculopathy at C8 01/18/2018  . Right lateral epicondylitis 12/21/2017  . Pain in joint, shoulder region 04/23/2015  . Periumbilical abdominal pain 02/13/2015  . Elevated PSA 08/04/2014  . Subacromial bursitis 04/11/2014  . Neck pain on right side 03/05/2014  . Bilateral shoulder pain 03/05/2014  . Recurrent boils 03/05/2014  . Foreign body in stomach 12/31/2013  . Reflux esophagitis 12/31/2013  . Weight loss 05/06/2012  . Incisional hernia 11/23/2011  . Cramp of limb 01/13/2011  . Hyperthyroidism 08/11/2010  . Sebaceous cyst 08/10/2010  . HYPERTROPHY PROSTATE W/UR OBST & OTH LUTS 02/27/2010  . SMOKER 08/05/2009  . 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 HEMORRHOIDS, INTERNAL, WITH BLEEDING 04/26/2008  . Hyperlipidemia 12/27/2007  . LOW BACK PAIN, CHRONIC 11/08/2007  . Schizophrenia ([place]) 01/25/2007  . ANXIETY DISORDER, GENERALIZED 01/25/2007  . DEPRESSION 01/25/2007  . Essential hypertension 01/25/2007  . ALLERGIC RHINITIS 01/25/2007  . SCOLIOSIS NEC 01/25/2007  . Impaired glucose tolerance 01/25/2007    Current Outpatient Medications  Medication Sig Dispense Refill  . acetaminophen (TYLENOL) 500 MG tablet Take 1,000 mg by mouth every 6 (six) hours as needed for moderate pain.     [name] albuterol (VENTOLIN HFA) 108 (90 Base) MCG/ACT inhaler Inhale 2 puffs into the lungs every 6 (six) hours as needed for wheezing or shortness of breath. 8 g 6  . amLODipine (NORVASC) 10 MG tablet Take 1 tablet (10 mg total) by mouth daily. 90 tablet 2  . benztropine (COGENTIN) 0.5 MG tablet Take 0.5 mg by mouth 2 (two) times daily.     . Blood Pressure Monitoring (BLOOD PRESSURE CUFF) MISC Use daily as directed to check blood pressure 1 each 0  . cyclobenzaprine (FLEXERIL) 10 MG tablet  TAKE 1 TABLET BY MOUTH EVERYDAY AT BEDTIME (Patient taking differently: Take 10 mg by mouth at bedtime. ) 30 tablet 3  . diclofenac Sodium (VOLTAREN) 1 % GEL Apply 2 g topically 4 (four) times daily. (Patient taking differently: Apply 2 g topically 4 (four) times daily as needed (pain). ) 150 g 1  . esomeprazole (NEXIUM) 40 MG capsule Take 1 capsule (40 mg total) by mouth daily. 90 capsule 3  . fluPHENAZine (PROLIXIN) 10 MG tablet Take 2 tablets (20 mg total) by mouth daily. 60 tablet 0  . fluticasone (FLONASE) 50 MCG/ACT nasal spray Place 2 sprays into both nostrils daily. 16 g 6  . gabapentin (NEURONTIN) 300 MG capsule Take as bedtime as directed (Patient taking differently: Take 300 mg by mouth at bedtime. Take as bedtime as directed) 90 capsule 3  . KLOR-CON M20 20 MEQ tablet Take 1 tablet (20 mEq total) by mouth daily. 90 tablet 2  . losartan (COZAAR) 100 MG tablet TAKE 1 TABLET BY MOUTH EVERY DAY (Patient taking differently: Take 100 mg by mouth daily. ) 90 tablet 2  . naproxen sodium (ALEVE) 220 MG tablet Take 440 mg by mouth daily as needed (pain).    [name] oxyCODONE-acetaminophen (PERCOCET/ROXICET) 5-325 MG tablet Take 1 tablet by mouth every 6 (six) hours as needed for pain.    . SYMBICORT 160-4.5 MCG/ACT inhaler TAKE 2 PUFFS BY MOUTH TWICE A DAY (Patient taking differently: Inhale 2 puffs into the lungs in the morning and at bedtime. ) 30.6 Inhaler 1  . tamsulosin (FLOMAX) 0.4  MG CAPS capsule Take 0.4 mg by mouth daily.     No current facility-administered medications for this visit.    Allergies: Celecoxib, Iohexol, Metoclopramide hcl, and Wellbutrin [bupropion]  Past Medical History:  Diagnosis Date  . ALLERGIC RHINITIS 01/25/2007  . ANXIETY DISORDER, GENERALIZED 01/25/2007  . Arthritis   . Chronic kidney disease    stage 3  . DEPRESSION 01/25/2007  . ESOPHAGITIS 12/28/2007  . Fatty liver 10/09/2010  . GERD 01/25/2007  . GLUCOSE INTOLERANCE, HX OF 01/25/2007  . Heart murmur    hx of    . HEMORRHOIDS, INTERNAL, WITH BLEEDING 04/26/2008  . Hiatal hernia   . HYPERLIPIDEMIA 12/27/2007  . HYPERTENSION, ESSENTIAL NOS 01/25/2007  . Hyperthyroidism 08/11/2010  . HYPERTROPHY PROSTATE W/UR OBST & OTH LUTS 02/27/2010  . 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 LOW BACK PAIN, CHRONIC 11/08/2007  . Pancreatitis   . Prostate cancer ([place])   . Rectal abscess   . 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 SCOLIOSIS NEC 01/25/2007  . Sebaceous cyst 08/10/2010  . SMOKER 08/05/2009  . UPPER GASTROINTESTINAL HEMORRHAGE 01/25/2007    Past Surgical History:  Procedure Laterality Date  . ABDOMINAL EXPLORATION SURGERY  1988  . CHOLECYSTECTOMY  05/20/08   Dr. Zella Richer  . COLONOSCOPY  04/23/2010   normal  . ESOPHAGOGASTRODUODENOSCOPY N/A 12/31/2013   Procedure: ESOPHAGOGASTRODUODENOSCOPY (EGD);  Surgeon: Gatha Mayer, MD;  Location: [facility];  Service: Endoscopy;  Laterality: N/A;  . Excision of scalp lesion  07/2009  . Excision of thyroid mass    . GANGLION CYST EXCISION     right foot x 2  . HERNIA REPAIR     ventral  . INCISIONAL HERNIA REPAIR  12/01/2011   Procedure: LAPAROSCOPIC INCISIONAL HERNIA;  Surgeon: Harl Bowie, MD;  Location: [facility];  Service: General;  Laterality: N/A;  . SHOULDER SURGERY     right    Family History  Problem Relation Age of Onset  . Hypertension Father   . Kidney disease Father   . Hypertension Mother   . Prostate cancer Other   . Colon cancer Neg Hx     Social History   Tobacco Use  . Smoking status: Current Every Day Smoker    Packs/day: 2.50    Years: 49.00    Pack years: 122.50    Types: Cigarettes    Start date: 26  . Smokeless tobacco: Never Used  Substance Use Topics  . Alcohol use: No    Subjective:  Accompanied by his wife; notes that he had an injection with his neurosurgeon and was told there "was a blockage" in his neck and needed to follow-up here; per wife, he has actually had an episode of syncope with coughing recently;  knows that he needs to quit smoking but has not had any success with medication treatment/ feels that his anxiety is worse when he tries to quit smoking.  Also overdue for lung cancer CT- did not get his scan done in 2020; would like to be re-scheduled.    Objective:  Vitals:   10/12/19 0850  BP: 140/80  Pulse: 94  Temp: 98.6 F (37 C)  TempSrc: Oral  SpO2: 97%  Weight: 245 lb 9.6 oz (111.4 kg)    General: Well developed, well nourished, in no acute distress  Skin : Warm and dry.  Head: Normocephalic and atraumatic  Lungs: Respirations unlabored;  CVS exam: normal rate and regular rhythm.  Neurologic: Alert and oriented; speech intact; face symmetrical; moves  all extremities well; CNII-XII intact without focal deficit   Assessment:  1. Carotid bruit, unspecified laterality   2. Tobacco abuse   3. Prostate cancer ([place])     Plan:  1. No records are available today for review from neurosurgeon; will go ahead and update bilateral carotid dopplers and refer to vascular surgeon to discuss treatment options; 2. Stressed again the absolute necessity of quitting smoking; refer back to lung cancer screening program through pulmonology; 3. Keep planned follow-up with prostate cancer clinic for early July;   This visit occurred during the SARS-CoV-2 public health emergency.  Safety protocols were in place, including screening questions prior to the visit, additional usage of staff PPE, and extensive cleaning of exam room while observing appropriate contact time as indicated for disinfecting solutions.     No follow-ups on file.  Orders Placed This Encounter  Procedures  . US Carotid Duplex Bilateral    Standing Status:   Future    Standing Expiration Date:   10/11/2020    Order Specific Question:   Reason for exam:    Answer:   bruit noted    Order Specific Question:   Preferred imaging location?    Answer:   [facility]  . Ambulatory Referral for Lung Cancer Scre     Referral Priority:   Routine    Referral Type:   Consultation    Referral Reason:   Specialty Services Required    Number of Visits Requested:   1  . Ambulatory referral to Vascular Surgery    Referral Priority:   Routine    Referral Type:   Surgical    Referral Reason:   Specialty Services Required    Requested Specialty:   Vascular Surgery    Number of Visits Requested:   1    Requested Prescriptions    No prescriptions requested or ordered in this encounter

## 2019-10-15 ENCOUNTER — Encounter: Payer: Self-pay | Admitting: Medical Oncology

## 2019-10-15 ENCOUNTER — Telehealth: Payer: Self-pay | Admitting: Family

## 2019-10-15 NOTE — Telephone Encounter (Signed)
New message:    Pt's spouse is returning a call for assistant. Please advise.

## 2019-10-15 NOTE — Telephone Encounter (Signed)
Called pt spoke w/spouse. Gave her Mickel Baas response. She states she will let him know and take him. Inform her I will cancel the appt that is made for tomorrow.Marland KitchenJohny Chess

## 2019-10-15 NOTE — Telephone Encounter (Signed)
[facility] called over after triaging the patient. Patient has stomach pain at a 8 today but has been a 10 the last couple of days. The triage advise 911 now. Patient has requested and already has an appointment for tomorrow with Valere Dross.  Please follow up with patient on what needs to be done. Thank you.

## 2019-10-15 NOTE — Telephone Encounter (Signed)
I would agree that he needs to go to the ER if he is having 10/10 abdominal pain. He is going to need imaging of his abdomen and I can't always get that set up quickly.

## 2019-10-15 NOTE — Telephone Encounter (Signed)
Tried calling pt bck there was no answer LMOM RTC ASAP.Marland KitchenJohny [name]

## 2019-10-16 ENCOUNTER — Other Ambulatory Visit: Payer: Self-pay

## 2019-10-16 ENCOUNTER — Encounter (HOSPITAL_COMMUNITY): Payer: Self-pay

## 2019-10-16 ENCOUNTER — Emergency Department (HOSPITAL_COMMUNITY)
Admission: EM | Admit: 2019-10-16 | Discharge: 2019-10-16 | Disposition: A | Discharge status: Home / Self Care | Payer: Medicare HMO | Attending: Emergency Medicine | Admitting: Emergency Medicine

## 2019-10-16 ENCOUNTER — Ambulatory Visit: Payer: Medicare HMO | Admitting: Family

## 2019-10-16 DIAGNOSIS — R109 Unspecified abdominal pain: Secondary | ICD-10-CM | POA: Insufficient documentation

## 2019-10-16 DIAGNOSIS — Z5321 Procedure and treatment not carried out due to patient leaving prior to being seen by health care provider: Secondary | ICD-10-CM | POA: Insufficient documentation

## 2019-10-16 LAB — COMPREHENSIVE METABOLIC PANEL
ALT: 19 U/L (ref 0–44)
AST: 15 U/L (ref 15–41)
Albumin: 4.1 g/dL (ref 3.5–5.0)
Alkaline Phosphatase: 80 U/L (ref 38–126)
Anion gap: 9 (ref 5–15)
BUN: 7 mg/dL — ABNORMAL LOW (ref 8–23)
CO2: 31 mmol/L (ref 22–32)
Calcium: 9.4 mg/dL (ref 8.9–10.3)
Chloride: 101 mmol/L (ref 98–111)
Creatinine, Ser: 1.33 mg/dL — ABNORMAL HIGH (ref 0.61–1.24)
GFR calc Af Amer: >60 mL/min (ref >60)
GFR calc non Af Amer: 57 mL/min — ABNORMAL LOW (ref >60)
Glucose, Bld: 90 mg/dL (ref 70–99)
Potassium: 4.1 mmol/L (ref 3.5–5.1)
Sodium: 141 mmol/L (ref 135–145)
Total Bilirubin: 0.5 mg/dL (ref 0.3–1.2)
Total Protein: 7.7 g/dL (ref 6.5–8.1)

## 2019-10-16 LAB — CBC
HCT: 45.5 % (ref 39.0–52.0)
Hemoglobin: 14.8 g/dL (ref 13.0–17.0)
MCH: 29.2 pg (ref 26.0–34.0)
MCHC: 32.5 g/dL (ref 30.0–36.0)
MCV: 89.7 fL (ref 80.0–100.0)
Platelets: 327 10*3/uL (ref 150–400)
RBC: 5.07 MIL/uL (ref 4.22–5.81)
RDW: 14.6 % (ref 11.5–15.5)
WBC: 13.7 10*3/uL — ABNORMAL HIGH (ref 4.0–10.5)
nRBC: 0 % (ref 0.0–0.2)

## 2019-10-16 LAB — LIPASE, BLOOD: Lipase: 27 U/L (ref 11–51)

## 2019-10-16 MED ORDER — SODIUM CHLORIDE 0.9% FLUSH: 3.0000 mL | Freq: Once | INTRAVENOUS | Status: DC

## 2019-10-16 NOTE — ED Triage Notes (Addendum)
Pt presents with c/o abdominal pain and bloating for the past 3 days. Pt reports the pain is cramping in nature and reports he was told to come here by his MD for a scan. Pt has a known abdominal hernia. Pt reports he had a fall last week and is hoping he did not injure the hernia in the fall.

## 2019-10-23 ENCOUNTER — Encounter: Payer: Self-pay | Admitting: Medical Oncology

## 2019-10-24 ENCOUNTER — Encounter: Payer: Self-pay | Admitting: Radiation Oncology

## 2019-10-24 NOTE — Progress Notes (Signed)
GU Location of Tumor / Histology: prostatic adenocarcinoma  If Prostate Cancer, Gleason Score is (3 + 3) and PSA is (5.5) at diagnosis.   [name] was diagnosed in 2016 with a gleason score of 3+3 and psa 5.5. Patient opted for active surveillance. Repeat biopsy in 2017 revealed gleason 3+3 in less cores. Then, biopsy done 08/21/2018 after a psa of 15.50 reveal only one atypical cell and no ca.       Biopsies of prostate (if applicable) revealed:   Past/Anticipated interventions by urology, if any: biopsy, active surveillance, prostate biopsy, prostate biopsy, referral to [facility]  Past/Anticipated interventions by medical oncology, if any: no  Weight changes, if any: no  Bowel/Bladder complaints, if any: IPSS 11. SHIM 10. Reports erectile dysfunction. Denies incontinence. Reports nocturia x 2-3.    Nausea/Vomiting, if any: no  Pain issues, if any:  Reports pain in his neck, back and shoulders.   SAFETY ISSUES:  Prior radiation? no  Pacemaker/ICD? no  Possible current pregnancy? no, male patient  Is the patient on methotrexate? no  Current Complaints / other details:  64 year old male. Resides in [place]. Married. Current everyday smoker. Disabled related to schizophrenia.

## 2019-10-25 ENCOUNTER — Encounter: Payer: Self-pay | Admitting: Medical Oncology

## 2019-10-25 DIAGNOSIS — M47812 Spondylosis without myelopathy or radiculopathy, cervical region: Secondary | ICD-10-CM | POA: Diagnosis not present

## 2019-10-25 DIAGNOSIS — F22 Delusional disorders: Secondary | ICD-10-CM | POA: Diagnosis not present

## 2019-10-25 DIAGNOSIS — I10 Essential (primary) hypertension: Secondary | ICD-10-CM | POA: Diagnosis not present

## 2019-10-25 DIAGNOSIS — M542 Cervicalgia: Secondary | ICD-10-CM | POA: Diagnosis not present

## 2019-10-25 DIAGNOSIS — Z6825 Body mass index (BMI) 25.0-25.9, adult: Secondary | ICD-10-CM | POA: Insufficient documentation

## 2019-10-25 DIAGNOSIS — F200 Paranoid schizophrenia: Secondary | ICD-10-CM | POA: Diagnosis not present

## 2019-10-25 NOTE — Progress Notes (Signed)
Spoke with patient to confirm appointment for [facility] 7/9, arriving at 8 am. I reviewed [place] parking, registration and COVID protocols. I reminded him to bring his completed medical forms. He voiced understanding of the above.

## 2019-10-25 NOTE — Progress Notes (Signed)
Radiation Oncology         [phone] ________________________________  Multidisciplinary Prostate Cancer Clinic  Initial Radiation Oncology Consultation  Name: [name] MRN: [id number]  Date: 10/26/2019  DOB: 1955/05/29  VE:LFYBOF, Marvis Repress, FNP  Ardis Hughs, MD   REFERRING PHYSICIAN: Ardis Hughs, MD  DIAGNOSIS: 64 y.o. gentleman with stage T1c adenocarcinoma of the prostate with a Gleason's score of 3+3 and a PSA of 12    ICD-10-CM   1. Malignant neoplasm of prostate ([place])  C61     HISTORY OF PRESENT ILLNESS::[name] is a 64 y.o. gentleman.  He has been followed by Dr. Louis Meckel for an elevated PSA since at least 2016. He was initially diagnosed with low-volume Gleason 3+3 prostate cancer in 09/2014 with a PSA of 5.5 and elected to proceed in active surveillance. Surveillance biopsy performed in 05/2015 again showed stable, low-volume Gleason 3+3 disease so he continued in active surveillance.  He did not return for follow up with Urology until 03/2018 when his PSA was significantly elevated at 20.20. Repeat PSA in 06/2018 had decreased but remained elevated at 15.5. Therefore, he proceeded with a surveillance biopsy performed in 08/2018 which showed one core of HGPIN and a prostate volume of 105 grams so he elected to proceed in active surveillance.  He did develop acute prostatitis/UTI with AUR following this procedure but this resolved with antibiotics and started on Flomax at that time.  His most recent PSA from 06/2019 was 12 so he has been recommended to continue AS. However, his brother was recently diagnosed with prostate cancer and is getting treated in [place] so he preferred to get a second opinion through the [facility] clinic today.  The patient reviewed the PSA and biopsy results with his urologist and he has kindly been referred today to the multidisciplinary prostate cancer clinic for presentation of pathology and radiology studies in our  conference for discussion of potential radiation treatment options and clinical evaluation.  PREVIOUS RADIATION THERAPY: No  PAST MEDICAL HISTORY:  has a past medical history of ALLERGIC RHINITIS (01/25/2007), ANXIETY DISORDER, GENERALIZED (01/25/2007), Arthritis, Chronic kidney disease, DEPRESSION (01/25/2007), ESOPHAGITIS (12/28/2007), Fatty liver (10/09/2010), GERD (01/25/2007), GLUCOSE INTOLERANCE, HX OF (01/25/2007), Heart murmur, HEMORRHOIDS, INTERNAL, WITH BLEEDING (04/26/2008), Hiatal hernia, HYPERLIPIDEMIA (12/27/2007), HYPERTENSION, ESSENTIAL NOS (01/25/2007), Hyperthyroidism (08/11/2010), HYPERTROPHY PROSTATE W/UR OBST & OTH LUTS (02/27/2010), Impotence of organic origin (08/05/2009), LOW BACK PAIN, CHRONIC (11/08/2007), Pancreatitis, Prostate cancer ([place]), Rectal abscess, SCHIZOPHRENIA NEC, CHRONIC (01/25/2007), SCOLIOSIS NEC (01/25/2007), Sebaceous cyst (08/10/2010), SMOKER (08/05/2009), and UPPER GASTROINTESTINAL HEMORRHAGE (01/25/2007).    PAST SURGICAL HISTORY: Past Surgical History:  Procedure Laterality Date  . ABDOMINAL EXPLORATION SURGERY  1988  . CHOLECYSTECTOMY  05/20/08   Dr. Zella Richer  . COLONOSCOPY  04/23/2010   normal  . ESOPHAGOGASTRODUODENOSCOPY N/A 12/31/2013   Procedure: ESOPHAGOGASTRODUODENOSCOPY (EGD);  Surgeon: Gatha Mayer, MD;  Location: [facility];  Service: Endoscopy;  Laterality: N/A;  . Excision of scalp lesion  07/2009  . Excision of thyroid mass    . GANGLION CYST EXCISION     right foot x 2  . HERNIA REPAIR     ventral  . INCISIONAL HERNIA REPAIR  12/01/2011   Procedure: LAPAROSCOPIC INCISIONAL HERNIA;  Surgeon: Harl Bowie, MD;  Location: [facility];  Service: General;  Laterality: N/A;  . SHOULDER SURGERY     right    FAMILY HISTORY: family history includes Bone cancer in his mother; Hypertension in his father and mother; Kidney disease in his brother  and father; Prostate cancer in an other family member.  SOCIAL HISTORY:  reports that he has been smoking  cigarettes. He started smoking about 50 years ago. He has a 122.50 pack-year smoking history. He has never used smokeless tobacco. He reports current drug use. Frequency: 14.00 times per week. Drug: Marijuana. He reports that he does not drink alcohol.  ALLERGIES: Celecoxib, Iohexol, Ivp dye [iodinated diagnostic agents], Metoclopramide hcl, Ritalin [methylphenidate], and Wellbutrin [bupropion]  MEDICATIONS:  Current Outpatient Medications  Medication Sig Dispense Refill  . amLODipine (NORVASC) 10 MG tablet Take 1 tablet (10 mg total) by mouth daily. 90 tablet 2  . Blood Pressure Monitoring (BLOOD PRESSURE CUFF) MISC Use daily as directed to check blood pressure 1 each 0  . cyclobenzaprine (FLEXERIL) 10 MG tablet TAKE 1 TABLET BY MOUTH EVERYDAY AT BEDTIME (Patient taking differently: Take 10 mg by mouth at bedtime. ) 30 tablet 3  . diclofenac Sodium (VOLTAREN) 1 % GEL Apply 2 g topically 4 (four) times daily. (Patient taking differently: Apply 2 g topically 4 (four) times daily as needed (pain). ) 150 g 1  . esomeprazole (NEXIUM) 40 MG capsule Take 1 capsule (40 mg total) by mouth daily. 90 capsule 3  . fluPHENAZine (PROLIXIN) 10 MG tablet Take 2 tablets (20 mg total) by mouth daily. 60 tablet 0  . fluticasone (FLONASE) 50 MCG/ACT nasal spray Place 2 sprays into both nostrils daily. 16 g 6  . gabapentin (NEURONTIN) 300 MG capsule Take as bedtime as directed (Patient taking differently: Take 300 mg by mouth at bedtime. Take as bedtime as directed) 90 capsule 3  . KLOR-CON M20 20 MEQ tablet Take 1 tablet (20 mEq total) by mouth daily. 90 tablet 2  . losartan (COZAAR) 100 MG tablet TAKE 1 TABLET BY MOUTH EVERY DAY (Patient taking differently: Take 100 mg by mouth daily. ) 90 tablet 2  . SYMBICORT 160-4.5 MCG/ACT inhaler TAKE 2 PUFFS BY MOUTH TWICE A DAY (Patient taking differently: Inhale 2 puffs into the lungs in the morning and at bedtime. ) 30.6 Inhaler 1  . tamsulosin (FLOMAX) 0.4 MG CAPS capsule  Take 0.4 mg by mouth daily.    [name] acetaminophen (TYLENOL) 500 MG tablet Take 1,000 mg by mouth every 6 (six) hours as needed for moderate pain.  (Patient not taking: Reported on 10/26/2019)    . albuterol (VENTOLIN HFA) 108 (90 Base) MCG/ACT inhaler Inhale 2 puffs into the lungs every 6 (six) hours as needed for wheezing or shortness of breath. (Patient not taking: Reported on 10/26/2019) 8 g 6  . benztropine (COGENTIN) 0.5 MG tablet Take 0.5 mg by mouth 2 (two) times daily.  (Patient not taking: Reported on 10/26/2019)    . naproxen sodium (ALEVE) 220 MG tablet Take 440 mg by mouth daily as needed (pain). (Patient not taking: Reported on 10/26/2019)    . oxyCODONE-acetaminophen (PERCOCET/ROXICET) 5-325 MG tablet Take 1 tablet by mouth every 6 (six) hours as needed for pain. (Patient not taking: Reported on 10/26/2019)     No current facility-administered medications for this encounter.    REVIEW OF SYSTEMS:  On review of systems, the patient reports that he is doing well overall. He denies any chest pain, shortness of breath, cough, fevers, chills, night sweats, unintended weight changes. He denies any bowel disturbances, and denies abdominal pain, nausea or vomiting. He denies any new musculoskeletal or joint aches or pains. His IPSS was 11, indicating moderate urinary symptoms. He is taking Flomax daily. His SHIM was  10, indicating he does have moderate erectile dysfunction. A complete review of systems is obtained and is otherwise negative.   PHYSICAL EXAM:  Wt Readings from Last 3 Encounters:  10/26/19 243 lb 9.6 oz (110.5 kg)  10/12/19 245 lb 9.6 oz (111.4 kg)  09/15/19 250 lb (113.4 kg)   Temp Readings from Last 3 Encounters:  10/26/19 98.8 F (37.1 C)  10/12/19 98.6 F (37 C) (Oral)  09/15/19 97.9 F (36.6 C) (Oral)   BP Readings from Last 3 Encounters:  10/26/19 137/83  10/12/19 140/80  09/15/19 (!) 155/107   Pulse Readings from Last 3 Encounters:  10/26/19 94  10/12/19 94  09/15/19  95    /10  In general this is a well appearing African American male in no acute distress. He is alert and oriented x4 and appropriate throughout the examination. HEENT reveals that the patient is normocephalic, atraumatic. EOMs are intact. PERRLA. Skin is intact without any evidence of gross lesions. Cardiopulmonary assessment is negative for acute distress and he exhibits normal effort. Lower extremities are negative for pretibial pitting edema, deep calf tenderness, cyanosis or clubbing.  KPS = 90  100 - Normal; no complaints; no evidence of disease. 90   - Able to carry on normal activity; minor signs or symptoms of disease. 80   - Normal activity with effort; some signs or symptoms of disease. 39   - Cares for self; unable to carry on normal activity or to do active work. 60   - Requires occasional assistance, but is able to care for most of his personal needs. 50   - Requires considerable assistance and frequent medical care. 8   - Disabled; requires special care and assistance. 62   - Severely disabled; hospital admission is indicated although death not imminent. 53   - Very sick; hospital admission necessary; active supportive treatment necessary. 10   - Moribund; fatal processes progressing rapidly. 0     - Dead  Karnofsky DA, Abelmann [place], Craver LS and Burchenal [facility] [phone]) The use of the nitrogen mustards in the palliative treatment of carcinoma: with particular reference to bronchogenic carcinoma Cancer 1 634-56   LABORATORY DATA:  Lab Results  Component Value Date   WBC 13.7 (H) 10/16/2019   HGB 14.8 10/16/2019   HCT 45.5 10/16/2019   MCV 89.7 10/16/2019   PLT 327 10/16/2019   Lab Results  Component Value Date   NA 141 10/16/2019   K 4.1 10/16/2019   CL 101 10/16/2019   CO2 31 10/16/2019   Lab Results  Component Value Date   ALT 19 10/16/2019   AST 15 10/16/2019   ALKPHOS 80 10/16/2019   BILITOT 0.5 10/16/2019     RADIOGRAPHY: No results found.     IMPRESSION/PLAN: 64 y.o. gentleman with Stage T1c adenocarcinoma of the prostate with a PSA of 12 and a Gleason score of 3+3.    We discussed the patient's workup and outlined the nature of prostate cancer in this setting. The patient's T stage, Gleason's score, and PSA put him into the low risk group. Accordingly, he is eligible for a variety of potential treatment options including active surveillance (preferred), brachytherapy, 5.5 weeks of external radiation, or prostatectomy. We discussed the available radiation techniques, and focused on the details and logistics of delivery. The patient is not an ideal candidate for brachytherapy with a prostate volume of 105 cc. We discussed and outlined the risks, benefits, short and long-term effects associated with daily external beam radiotherapy  and compared and contrasted these with prostatectomy. We discussed the role of SpaceOAR in reducing the rectal toxicity associated with radiotherapy.  He was encouraged to ask questions that were answered to his stated satisfaction.  At the end of the conversation the patient is comfortable moving forward with active surveillance based on the consensus recommendation from our multidisciplinary clinic today.  We will share our discussion with Dr. Louis Meckel with recommendation for consideration of repeat prostate MRI in 2022 prior to a repeat surveillance biopsy.  We enjoyed meeting him today and would be more than happy to continue to participate in his care if clinically indicated in the future.     Nicholos Johns, PA-C    Tyler Pita, MD  [facility] Direct Dial: [phone]  Fax: [phone] [email or web address]  Skype  LinkedIn   This document serves as a record of services personally performed by Tyler Pita, MD and Freeman Caldron, PA-C. It was created on their behalf by Wilburn Mylar, a trained medical scribe. The creation of this record is based on the scribe's personal  observations and the provider's statements to them. This document has been checked and approved by the attending provider.

## 2019-10-26 ENCOUNTER — Encounter: Payer: Self-pay | Admitting: Radiation Oncology

## 2019-10-26 ENCOUNTER — Other Ambulatory Visit: Payer: Self-pay

## 2019-10-26 ENCOUNTER — Ambulatory Visit
Admission: RE | Admit: 2019-10-26 | Discharge: 2019-10-26 | Disposition: A | Discharge status: Home / Self Care | Payer: Medicare HMO | Source: Ambulatory Visit | Attending: Radiation Oncology | Admitting: Radiation Oncology

## 2019-10-26 ENCOUNTER — Encounter: Payer: Self-pay | Admitting: Medical Oncology

## 2019-10-26 ENCOUNTER — Encounter: Payer: Self-pay | Admitting: General Practice

## 2019-10-26 ENCOUNTER — Encounter: Payer: Self-pay | Admitting: Urology

## 2019-10-26 VITALS — BP 137/83 | HR 94 | Temp 98.8°F | Resp 20 | Ht 69.0 in | Wt 243.6 lb

## 2019-10-26 DIAGNOSIS — C61 Malignant neoplasm of prostate: Secondary | ICD-10-CM

## 2019-10-26 DIAGNOSIS — N189 Chronic kidney disease, unspecified: Secondary | ICD-10-CM | POA: Diagnosis not present

## 2019-10-26 DIAGNOSIS — F17210 Nicotine dependence, cigarettes, uncomplicated: Secondary | ICD-10-CM | POA: Diagnosis not present

## 2019-10-26 DIAGNOSIS — I129 Hypertensive chronic kidney disease with stage 1 through stage 4 chronic kidney disease, or unspecified chronic kidney disease: Secondary | ICD-10-CM | POA: Insufficient documentation

## 2019-10-26 DIAGNOSIS — F419 Anxiety disorder, unspecified: Secondary | ICD-10-CM | POA: Insufficient documentation

## 2019-10-26 DIAGNOSIS — Z87891 Personal history of nicotine dependence: Secondary | ICD-10-CM | POA: Diagnosis not present

## 2019-10-26 DIAGNOSIS — Z79899 Other long term (current) drug therapy: Secondary | ICD-10-CM | POA: Insufficient documentation

## 2019-10-26 DIAGNOSIS — K760 Fatty (change of) liver, not elsewhere classified: Secondary | ICD-10-CM | POA: Insufficient documentation

## 2019-10-26 DIAGNOSIS — E785 Hyperlipidemia, unspecified: Secondary | ICD-10-CM | POA: Insufficient documentation

## 2019-10-26 DIAGNOSIS — R972 Elevated prostate specific antigen [PSA]: Secondary | ICD-10-CM | POA: Diagnosis not present

## 2019-10-26 DIAGNOSIS — Z8042 Family history of malignant neoplasm of prostate: Secondary | ICD-10-CM | POA: Diagnosis not present

## 2019-10-26 NOTE — Progress Notes (Signed)
[place] Psychosocial Distress Screening Spiritual Care  Met with Javani and his wife [name] in [facility] to introduce [facility] team/resources, reviewing distress screen per protocol.  The patient scored a 3 on the Psychosocial Distress Thermometer which indicates mild distress. Also assessed for distress and other psychosocial needs.   ONCBCN DISTRESS SCREENING 10/26/2019  Screening Type Initial Screening  Distress experienced in past week (1-10) 3  Emotional problem type Nervousness/Anxiety  Physical Problem type Pain;Sexual problems  Referral to support programs Yes   [facility] team/programming resources. Provided empathic listening, normalization of feelings, and Art Kit #2 (mandalas) per family request.  Follow up needed: No. Per couple, no other needs or concerns at this time, but they know to contact Team whenever needed.    [place], [place], [facility] Pager [phone] Voicemail [phone]

## 2019-10-26 NOTE — Consult Note (Signed)
[facility]     10/26/2019   --------------------------------------------------------------------------------   [name]  MRN: [id number]  DOB: [birth year], 64 year old Male  SSN:    PRIMARY CARE:  Evie Lacks. Plotnikov, MD  REFERRING:  Ardis Hughs, MD  PROVIDER:  Louis Meckel, M.D.  TREATING:  Raynelle Bring, M.D.  LOCATION:  [facility] [phone]     --------------------------------------------------------------------------------   CC/HPI: CC: Prostate Cancer   Physician requesting consult: Dr. Walker Kehr  Location of consult: [facility]   [name] is a 64 year old gentleman with a history of schizophrenia, depression, GERD, and hypertension. He has a history of low risk prostate cancer initially diagnosed in April 2016. At that time, he had a PSA of 5.5 and underwent an initial TRUS biopsy of the prostate on 07/27/14 by Dr. Burman Nieves. This demonstrated 3 out of 12 biopsy cores positive for Gleason 3+3=6 adenocarcinoma with 5% or less involvement from each core. He proceeded with active surveillance management under the care of Dr. Louis Meckel although was not uncommonly noted to be noncompliant with recommended follow up. An MRI of the prostate was performed on 05/16/15 and was unremarkable for suspicious lesions. He underwent a confirmatory biopsy on 06/17/15 that indicated 5% of 1 core out of 12 to be positive for Gleason 3+3=6 disease. His PSA increased to 20.2 in December 2019. This was rechecked and had decreased t0 15.5 in March 2020. Another biopsy was performed on 08/21/18 and demonstrated only HGPIN and atypical glands without definite malignancy. He did developed acute prostatitis and urinary retention following that biopsy. His PSA in March 2021 was 12.0. He was referred to Dr. Alen Blew for a 2nd opinion regarding his prostate cancer per his PCP, Dr. Alain Marion. Dr. Alen Blew felt  he would benefit from a visit to the prostate cancer multidisciplinary clinic prompting today's visit.   Family history: He has a brother with prostate cancer that recently underwent treatment in [place].   Imaging studies: None.   PMH: He has a history of schizophrenia, depression, GERD, hypertension.  PSH: Appendectomy, cholecystectomy.   TNM stage: cT1c Nx Mx  PSA: 12.0  Gleason score: 3+3=6  Initial biopsy (07/27/14): 3/12 cores positive  Left: L lateral mid (< 5%), L lateral base (< 5%)  Right: R lateral base (5%)  Prostate volume: 105 cc  PSAD: 0.11   Nomogram  OC disease: 66%  EPE: 27%  SVI: 2%  LNI: 1%  PFS (5 year, 10 year): 90%, 82%   Urinary function: IPSS is 11.  Erectile function: SHIM score is 10.     ALLERGIES: Contrast Dye Reglan TABS    MEDICATIONS: Sildenafil Citrate 100 mg tablet 1 tablet PO Daily  Tamsulosin Hcl 0.4 mg capsule 1 capsule PO Daily  Amlodipine Besylate 10 mg tablet Oral  Bactroban 2 % cream External  Cyclobenzaprine Hcl  Linzess 290 mcg capsule  Rosuvastatin Calcium 10 mg tablet     GU PSH: Prostate Needle Biopsy - 08/21/2018       PSH Notes: Appendectomy, Thyroid Surgery, Gallbladder Surgery  shunt in left side of neck   NON-GU PSH: Appendectomy - 2016 Repair Muscles Hand - 06/18/2019 Surgical Pathology, Gross And Microscopic Examination For Prostate Needle - 08/21/2018     GU PMH: Prostate Cancer - 07/16/2019, - 02/27/2019, - 06/21/2018, - 05/22/2018, - 11/01/2017, - 2018, Patient's PSA and rectal exam are reassuring. Her plans follow-up in 6 months and, -  07/31/2016 (Stable), - 2015-08-01, Prostate cancer, - 2015-08-01 Acute Cystitis/UTI - 09/04/2018, - 08/28/2018, - 08/28/2018 Urinary Retention - 09/04/2018 ED due to arterial insufficiency (Stable) - 07/31/2016, (Stable), The patient is has not tolerated sildenafil because of severe headache. We discussed alternative options including injections with the patient has declined at this time., 07-31-16, August 01, 2015 Urinary Obstruction - 07/31/2016 Straining on Urination, I sent a prescription for tamsulosin to the pharmacy for the patient to try C we can improve his stream and prevent him from straining to void. We'll try for 1 month before deciding whether to stop it or continue it. I sent him 11 refills. 2016-07-31 Weak Urinary Stream - Jul 31, 2016 Elevated PSA, Elevated prostate specific antigen (PSA) - 2015-08-01    NON-GU PMH: Encounter for general adult medical examination without abnormal findings, Encounter for preventive health examination - 2015/08/01 Personal history of other diseases of the circulatory system, History of hypertension - 08-01-2014, History of cardiac murmur, - 08/01/2014 Personal history of other diseases of the musculoskeletal system and connective tissue, History of arthritis - 08-01-2014 Personal history of other mental and behavioral disorders, History of depression - 08-01-2014 Personal history of other specified conditions, History of heartburn - 08-01-2014    FAMILY HISTORY: Death of family member - Runs In Family malignant neoplasm of breast - Runs In Family Primary cancer of bone marrow - Runs In Family renal failure - Runs In Family   SOCIAL HISTORY: Marital Status: Married Preferred Language: English; Ethnicity: Not Hispanic Or Latino; Race: Black or African American Does not drink caffeine.     Notes: Caffeine use, Disabled, Number of children, Alcohol use, Current every day smoker, Married   REVIEW OF SYSTEMS:    GU Review Male:   Patient denies frequent urination, hard to postpone urination, burning/ pain with urination, get up at night to urinate, leakage of urine, stream starts and stops, trouble starting your streams, and have to strain to urinate .  Gastrointestinal (Lower):   Patient denies diarrhea and constipation.  Gastrointestinal (Upper):   Patient denies nausea and vomiting.  Constitutional:   Patient denies fever, night sweats, weight loss, and fatigue.  Skin:   Patient denies skin rash/ lesion  and itching.  Eyes:   Patient denies blurred vision and double vision.  Ears/ Nose/ Throat:   Patient denies sore throat and sinus problems.  Hematologic/Lymphatic:   Patient denies swollen glands and easy bruising.  Cardiovascular:   Patient denies leg swelling and chest pains.  Respiratory:   Patient denies cough and shortness of breath.  Endocrine:   Patient denies excessive thirst.  Musculoskeletal:   Patient denies back pain and joint pain.  Neurological:   Patient denies headaches and dizziness.  Psychologic:   Patient denies depression and anxiety.   VITAL SIGNS: None   MULTI-SYSTEM PHYSICAL EXAMINATION:    Constitutional: Well-nourished. No physical deformities. Normally developed. Good grooming.     Complexity of Data:  Lab Test Review:   PSA  Records Review:   Pathology Reports, Previous Patient Records   07/09/19 06/21/18 04/06/18 10/26/17 04/26/17 02/01/17 07/27/16 01/26/16  PSA  Total PSA 12.00 ng/mL 15.50 ng/mL 20.20 ng/mL 5.79 ng/mL 7.09 ng/mL 10.40 ng/mL 5.08 ng/dl 7.47   Free PSA     0.75 ng/mL  0.59 ng/dl   % Free PSA     11 % PSA  12 %     PROCEDURES: None   ASSESSMENT:      ICD-10 Details  1 GU:  Prostate Cancer - C61    PLAN:            Medications Refill Meds: Tamsulosin Hcl 0.4 mg capsule 1 capsule PO Daily   #90  3 Refill(s)            Document Letter(s):  Created for Patient: Clinical Summary         Notes:   1. Low risk prostate cancer: I had a long detailed discussion with Mr. Quesinberry and his wife today regarding his prostate cancer situation. The patient was counseled about the natural history of prostate cancer and the standard treatment options that are available for prostate cancer. It was explained to him how his age and life expectancy, clinical stage, Gleason score, and PSA affect his prognosis, the decision to proceed with additional staging studies, as well as how that information influences recommended treatment strategies. We discussed  the roles for active surveillance, radiation therapy, surgical therapy, androgen deprivation, as well as ablative therapy options for the treatment of prostate cancer as appropriate to his individual cancer situation. We discussed the risks and benefits of these options with regard to their impact on cancer control and also in terms of potential adverse events, complications, and impact on quality of life particularly related to urinary and sexual function. The patient was encouraged to ask questions throughout the discussion today and all questions were answered to his stated satisfaction. In addition, the patient was provided with and/or directed to appropriate resources and literature for further education about prostate cancer and treatment options.   We specifically discussed his PSA which appears to be not terribly high when considering his prostate size is his PSA density is still low risk below 0.15. Therefore, his situation appears to be much more consistent with low risk disease rather than intermediate risk disease. I do think that ongoing active surveillance management is quite appropriate. Although we discussed options for curative therapy, he understands that the risks certainly may outweigh the benefits in this situation. He feels comfortable with ongoing surveillance and does have scheduled follow-up with Dr. Louis Meckel in the fall.   2. Erectile dysfunction: He did have questions regarding options for management of his erectile function. Apparently, he has not had a good response to PDE 5 inhibitors in the past. We therefore discussed all available treatment options. He will consider his options and further discuss this with Dr. Louis Meckel at his follow-up visit.   Cc: Dr. Cristie Hem Plotnikov  Dr. Burman Nieves  Dr. Tyler Pita         Next Appointment:      Next Appointment: 01/14/2020 10:00 AM    Appointment Type: Laboratory Appointment    Location: [facility] [phone]     Provider: Lab LAB    Reason for Visit: 6 MNTHS PSA-HERRICK      E & M CODES: We spent 38 minutes dedicated to evaluation and management time, including face to face interaction, discussions on coordination of care, documentation, result review, and discussion with others as applicable.

## 2019-10-26 NOTE — Progress Notes (Signed)
                               Care Plan Summary  Name: [name] DOB: [birth year]   Your Medical Team:   Urologist -  Dr. Raynelle Bring, [facility]  Radiation Oncologist - Dr. Tyler Pita, [facility]   Medical Oncologist - Dr. Zola Button, [place]  Recommendations: 1) Continue active surveillance with PSA check and repeat biopsy with Dr. Louis Meckel     * These recommendations are based on information available as of today's consult.      Recommendations may change depending on the results of further tests or exams.   Next Steps: 1) Follow up with Dr. Louis Meckel, 9/27.    When appointments need to be scheduled, you will be contacted by [facility] and/or [facility].  Questions?  Please do not hesitate to call Cira Rue, RN, BSN, OCN at [phone] any questions or concerns.  Shirlean Mylar is your Oncology Nurse Navigator and is available to assist you while you're receiving your medical care at [facility].

## 2019-11-02 ENCOUNTER — Other Ambulatory Visit: Payer: Self-pay | Admitting: Family

## 2019-11-02 ENCOUNTER — Encounter: Payer: Self-pay | Admitting: Medical Oncology

## 2019-11-02 NOTE — Progress Notes (Signed)
Spoke with patient as follow up to [facility]. He states he is doing well and has no questions or concerns at this time. He has chosen active surveillance and has a follow up appointment with Dr. Louis Meckel 9/27. He voiced confirmation. I asked him to call me if I can be of assistance in the future.

## 2019-11-23 ENCOUNTER — Other Ambulatory Visit: Payer: Self-pay | Admitting: *Deleted

## 2019-11-23 DIAGNOSIS — R0989 Other specified symptoms and signs involving the circulatory and respiratory systems: Secondary | ICD-10-CM

## 2019-11-30 ENCOUNTER — Other Ambulatory Visit: Payer: Self-pay | Admitting: Family

## 2019-11-30 MED ORDER — CYCLOBENZAPRINE HCL 10 MG PO TABS: ORAL_TABLET | ORAL | 3 refills | Status: DC

## 2019-11-30 MED ORDER — BUDESONIDE-FORMOTEROL FUMARATE 160-4.5 MCG/ACT IN AERO: 2.0000 | INHALATION_SPRAY | Freq: Two times a day (BID) | RESPIRATORY_TRACT | 1 refills | Status: DC

## 2019-12-10 ENCOUNTER — Ambulatory Visit (INDEPENDENT_AMBULATORY_CARE_PROVIDER_SITE_OTHER): Payer: Medicare HMO | Admitting: Surgery

## 2019-12-10 ENCOUNTER — Other Ambulatory Visit: Payer: Self-pay

## 2019-12-10 ENCOUNTER — Ambulatory Visit (HOSPITAL_COMMUNITY)
Admission: RE | Admit: 2019-12-10 | Discharge: 2019-12-10 | Disposition: A | Discharge status: Home / Self Care | Payer: Medicare HMO | Source: Ambulatory Visit | Attending: Family | Admitting: Family

## 2019-12-10 ENCOUNTER — Encounter: Payer: Self-pay | Admitting: Surgery

## 2019-12-10 VITALS — BP 132/85 | HR 90 | Temp 98.7°F | Resp 20 | Ht 69.0 in | Wt 245.3 lb

## 2019-12-10 DIAGNOSIS — R0989 Other specified symptoms and signs involving the circulatory and respiratory systems: Secondary | ICD-10-CM

## 2019-12-10 DIAGNOSIS — I6522 Occlusion and stenosis of left carotid artery: Secondary | ICD-10-CM

## 2019-12-10 NOTE — Progress Notes (Signed)
[facility]  Patient name: [name] MRN: [id number] DOB: 1955-07-08 Sex: male   REQUESTING PROVIDER:    Jodi Mourning   REASON FOR CONSULT:    Carotid bruit  HISTORY OF PRESENT ILLNESS:   [name] is a 64 y.o. male, who is referred for evaluation of carotid stenosis.  The patient was told at his neurosurgical appointment that he had a blockage.  He denies any symptoms.  He does have some numbness and radicular issues from nerve impingement in the cervical spine, however this is chronic and he does not have any new symptoms.  He also denies amaurosis fugax, slurred speech.  The patient suffers from stage III chronic renal insufficiency.  He states that he has been on a statin in the past but had this discontinued because of leg weakness.  He is medically managed for hypertension.  He is a schizophrenic and current smoker.  PAST MEDICAL HISTORY    Past Medical History:  Diagnosis Date  . ALLERGIC RHINITIS 01/25/2007  . ANXIETY DISORDER, GENERALIZED 01/25/2007  . Arthritis   . Chronic kidney disease    stage 3  . DEPRESSION 01/25/2007  . ESOPHAGITIS 12/28/2007  . Fatty liver 10/09/2010  . GERD 01/25/2007  . GLUCOSE INTOLERANCE, HX OF 01/25/2007  . Heart murmur    hx of   . HEMORRHOIDS, INTERNAL, WITH BLEEDING 04/26/2008  . Hiatal hernia   . HYPERLIPIDEMIA 12/27/2007  . HYPERTENSION, ESSENTIAL NOS 01/25/2007  . Hyperthyroidism 08/11/2010  . HYPERTROPHY PROSTATE W/UR OBST & OTH LUTS 02/27/2010  . 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 LOW BACK PAIN, CHRONIC 11/08/2007  . Pancreatitis   . Prostate cancer ([place])   . Rectal abscess   . 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 SCOLIOSIS NEC 01/25/2007  . Sebaceous cyst 08/10/2010  . SMOKER 08/05/2009  . UPPER GASTROINTESTINAL HEMORRHAGE 01/25/2007     FAMILY HISTORY   Family History  Problem Relation Age of Onset  . Hypertension Father   . Kidney disease  Father   . Hypertension Mother   . Bone cancer Mother   . Prostate cancer Other   . Kidney disease Brother   . Colon cancer Neg Hx     SOCIAL HISTORY:   Social History   Socioeconomic History  . Marital status: Married    Spouse name: Wynn Maudlin  . Number of children: 2  . Years of education: Not on file  . Highest education level: Not on file  Occupational History  . Occupation: Disabled    Employer: DISABLED  Tobacco Use  . Smoking status: Current Every Day Smoker    Packs/day: 2.50    Years: 49.00    Pack years: 122.50    Types: Cigarettes    Start date: 80  . Smokeless tobacco: Never Used  Vaping Use  . Vaping Use: Never used  Substance and Sexual Activity  . Alcohol use: No  . Drug use: Yes    Frequency: 14.0 times per week    Types: Marijuana  . Sexual activity: Yes    Partners: Female  Other Topics Concern  . Not on file  Social History Narrative   HSG disabled due to schizophrenia - 66. stable long-term marriage with children. cared for his father-in-law- passed away in 2022-07-20   Social Determinants of Health   Financial Resource Strain:   . Difficulty of Paying Living Expenses: Not on file  Food Insecurity:   . Worried About Charity fundraiser in  the Last Year: Not on file  . Ran Out of Food in the Last Year: Not on file  Transportation Needs:   . Lack of Transportation (Medical): Not on file  . Lack of Transportation (Non-Medical): Not on file  Physical Activity:   . Days of Exercise per Week: Not on file  . Minutes of Exercise per Session: Not on file  Stress:   . Feeling of Stress : Not on file  Social Connections:   . Frequency of Communication with Friends and Family: Not on file  . Frequency of Social Gatherings with Friends and Family: Not on file  . Attends Religious Services: Not on file  . Active Member of Clubs or Organizations: Not on file  . Attends Archivist Meetings: Not on file  . Marital Status: Not on file  Intimate  Partner Violence:   . Fear of Current or Ex-Partner: Not on file  . Emotionally Abused: Not on file  . Physically Abused: Not on file  . Sexually Abused: Not on file    ALLERGIES:    Allergies  Allergen Reactions  . Celecoxib Other (See Comments)     rectal bleeding  . Iohexol Other (See Comments)    Affects nerves: triggers schizophrenia  . Ivp Dye [Iodinated Diagnostic Agents]   . Metoclopramide Hcl Other (See Comments)    Affects nerves: triggers schizophrenia  . Ritalin [Methylphenidate]   . Wellbutrin [Bupropion] Other (See Comments)    Makes pt smoke heavily    CURRENT MEDICATIONS:    Current Outpatient Medications  Medication Sig Dispense Refill  . acetaminophen (TYLENOL) 500 MG tablet Take 1,000 mg by mouth every 6 (six) hours as needed for moderate pain.     [name] albuterol (VENTOLIN HFA) 108 (90 Base) MCG/ACT inhaler Inhale 2 puffs into the lungs every 6 (six) hours as needed for wheezing or shortness of breath. 8 g 6  . amLODipine (NORVASC) 10 MG tablet Take 1 tablet (10 mg total) by mouth daily. 90 tablet 2  . benztropine (COGENTIN) 0.5 MG tablet Take 0.5 mg by mouth 2 (two) times daily.     . Blood Pressure Monitoring (BLOOD PRESSURE CUFF) MISC Use daily as directed to check blood pressure 1 each 0  . budesonide-formoterol (SYMBICORT) 160-4.5 MCG/ACT inhaler Inhale 2 puffs into the lungs in the morning and at bedtime. TAKE 2 PUFFS BY MOUTH TWICE A DAY 30.6 g 1  . cyclobenzaprine (FLEXERIL) 10 MG tablet TAKE 1 TABLET BY MOUTH EVERYDAY AT BEDTIME 30 tablet 3  . diclofenac Sodium (VOLTAREN) 1 % GEL Apply 2 g topically 4 (four) times daily. (Patient taking differently: Apply 2 g topically 4 (four) times daily as needed (pain). ) 150 g 1  . esomeprazole (NEXIUM) 40 MG capsule Take 1 capsule (40 mg total) by mouth daily. 90 capsule 3  . fluPHENAZine (PROLIXIN) 10 MG tablet Take 2 tablets (20 mg total) by mouth daily. 60 tablet 0  . fluticasone (FLONASE) 50 MCG/ACT nasal  spray SPRAY 2 SPRAYS INTO EACH NOSTRIL EVERY DAY 48 mL 2  . gabapentin (NEURONTIN) 300 MG capsule Take as bedtime as directed (Patient taking differently: Take 300 mg by mouth at bedtime. Take as bedtime as directed) 90 capsule 3  . KLOR-CON M20 20 MEQ tablet Take 1 tablet (20 mEq total) by mouth daily. 90 tablet 2  . losartan (COZAAR) 100 MG tablet TAKE 1 TABLET BY MOUTH EVERY DAY (Patient taking differently: Take 100 mg by mouth daily. ) 90 tablet  2  . naproxen sodium (ALEVE) 220 MG tablet Take 440 mg by mouth daily as needed (pain).     [name] oxyCODONE-acetaminophen (PERCOCET/ROXICET) 5-325 MG tablet Take 1 tablet by mouth every 6 (six) hours as needed for pain.     . tamsulosin (FLOMAX) 0.4 MG CAPS capsule Take 0.4 mg by mouth daily.     No current facility-administered medications for this visit.    REVIEW OF SYSTEMS:   [X]  denotes positive finding, [ ]  denotes negative finding Cardiac  Comments:  Chest pain or chest pressure:    Shortness of breath upon exertion: x   Short of breath when lying flat:    Irregular heart rhythm:        Vascular    Pain in calf, thigh, or hip brought on by ambulation: x   Pain in feet at night that wakes you up from your sleep:     Blood clot in your veins:    Leg swelling:         Pulmonary    Oxygen at home:    Productive cough:     Wheezing:  x       Neurologic    Sudden weakness in arms or legs:     Sudden numbness in arms or legs:     Sudden onset of difficulty speaking or slurred speech:    Temporary loss of vision in one eye:     Problems with dizziness:         Gastrointestinal    Blood in stool:      Vomited blood:         Genitourinary    Burning when urinating:     Blood in urine:        Psychiatric    Major depression:         Hematologic    Bleeding problems:    Problems with blood clotting too easily:        Skin    Rashes or ulcers:        Constitutional    Fever or chills:     PHYSICAL EXAM:   Vitals:    12/10/19 1343 12/10/19 1347  BP: (!) 136/94 132/85  Pulse: 90   Resp: 20   Temp: 98.7 F (37.1 C)   TempSrc: Temporal   SpO2: 94%   Weight: 245 lb 4.8 oz (111.3 kg)   Height: 5\' 9"  (1.753 m)     GENERAL: The patient is a well-nourished male, in no acute distress. The vital signs are documented above. CARDIAC: There is a regular rate and rhythm.  PULMONARY: Nonlabored respirations MUSCULOSKELETAL: There are no major deformities or cyanosis. NEUROLOGIC: No focal weakness or paresthesias are detected. SKIN: There are no ulcers or rashes noted. PSYCHIATRIC: The patient has a normal affect.  STUDIES:   I have reviewed the following duplex: Right Carotid: Velocities in the right ICA are consistent with a 1-39%  stenosis.   Left Carotid: Velocities in the left ICA are consistent with a 40-59%  stenosis.        Non-hemodynamically significant plaque <50% noted in the  CCA. The        ECA appears >50% stenosed.   Vertebrals: Bilateral vertebral arteries demonstrate antegrade flow.  Subclavians: Normal flow hemodynamics were seen in bilateral subclavian        arteries.   ASSESSMENT and PLAN   Asymptomatic left carotid stenosis: I discussed with the patient and his family member that as long as  he remains asymptomatic, surgical correction would be reserved for stenosis greater than 80%.  Medical management is recommended for stenosis less than 80% if he is asymptomatic.  He will need to be on a aspirin, statin.  There is a questionable history of statin allergy however it is not listed.  I will defer to Dr. Valere Dross regarding whether or not to initiate statin therapy or consider treatment with Repatha.  I have the patient scheduled to follow-up again with me in 1 year for repeat carotid ultrasound.   Leia Alf, MD, FACS [facility] [phone] Pager [phone]

## 2019-12-17 ENCOUNTER — Telehealth: Payer: Self-pay | Admitting: Acute Care

## 2019-12-17 ENCOUNTER — Ambulatory Visit
Admission: RE | Admit: 2019-12-17 | Discharge: 2019-12-17 | Disposition: A | Discharge status: Home / Self Care | Payer: Medicare HMO | Source: Ambulatory Visit | Attending: Acute Care | Admitting: Acute Care

## 2019-12-17 ENCOUNTER — Other Ambulatory Visit: Payer: Self-pay

## 2019-12-17 DIAGNOSIS — Z87891 Personal history of nicotine dependence: Secondary | ICD-10-CM

## 2019-12-17 DIAGNOSIS — F17210 Nicotine dependence, cigarettes, uncomplicated: Secondary | ICD-10-CM

## 2019-12-17 DIAGNOSIS — Z122 Encounter for screening for malignant neoplasm of respiratory organs: Secondary | ICD-10-CM

## 2019-12-17 NOTE — Telephone Encounter (Signed)
Received call report from [place]on with [facility] on patient's Low dose CT done on 12/17/19. SG please review the result/impression copied below:  IMPRESSION: 1. Lung-RADS 4A, suspicious. Follow up low-dose chest CT without contrast in 3 months (please use the following order, "CT CHEST LCS NODULE FOLLOW-UP W/O CM") is recommended. Enlargement of a right apical pulmonary nodule, currently volume derived equivalent diameter 5.2 mm. 2. Aortic atherosclerosis (ICD10-I70.0), coronary artery atherosclerosis and emphysema (ICD10-J43.9). 3.  Tiny hiatal hernia. 4. Patent steatosis. 5. These results will be called to the ordering clinician or representative by the Radiologist Assistant, and communication documented in the PACS or [employer].   Please advise, thank you.

## 2019-12-17 NOTE — Telephone Encounter (Signed)
This will be called by the screening program. Thanks so much 

## 2019-12-25 ENCOUNTER — Other Ambulatory Visit: Payer: Self-pay | Admitting: *Deleted

## 2019-12-25 DIAGNOSIS — Z87891 Personal history of nicotine dependence: Secondary | ICD-10-CM

## 2019-12-25 DIAGNOSIS — F17210 Nicotine dependence, cigarettes, uncomplicated: Secondary | ICD-10-CM

## 2019-12-25 NOTE — Progress Notes (Signed)
I have called the patient with the results of his low dose CT. I explained that there is a nodule in the right apex of his lung that is very small, but that we will need to do a 3 month follow up scan on in November 2021 to ensure it is stable. I told him he would get a call closer to the time to get the scan scheduled. He verbalized understanding and had no further questions at the conclusion of the call.  Denise please place order for 3 month follow up, and fax results to PCP . Thanks so much

## 2019-12-25 NOTE — Progress Notes (Signed)
ct 

## 2020-01-14 DIAGNOSIS — C61 Malignant neoplasm of prostate: Secondary | ICD-10-CM | POA: Diagnosis not present

## 2020-01-15 DIAGNOSIS — F200 Paranoid schizophrenia: Secondary | ICD-10-CM | POA: Diagnosis not present

## 2020-01-21 DIAGNOSIS — C61 Malignant neoplasm of prostate: Secondary | ICD-10-CM | POA: Diagnosis not present

## 2020-01-21 DIAGNOSIS — N5201 Erectile dysfunction due to arterial insufficiency: Secondary | ICD-10-CM | POA: Diagnosis not present

## 2020-01-21 DIAGNOSIS — R3912 Poor urinary stream: Secondary | ICD-10-CM | POA: Diagnosis not present

## 2020-02-04 DIAGNOSIS — M7582 Other shoulder lesions, left shoulder: Secondary | ICD-10-CM | POA: Diagnosis not present

## 2020-02-04 DIAGNOSIS — M5412 Radiculopathy, cervical region: Secondary | ICD-10-CM | POA: Diagnosis not present

## 2020-02-10 ENCOUNTER — Other Ambulatory Visit: Payer: Self-pay | Admitting: Family

## 2020-02-28 DIAGNOSIS — S46012A Strain of muscle(s) and tendon(s) of the rotator cuff of left shoulder, initial encounter: Secondary | ICD-10-CM | POA: Diagnosis not present

## 2020-02-28 DIAGNOSIS — M542 Cervicalgia: Secondary | ICD-10-CM | POA: Diagnosis not present

## 2020-03-03 DIAGNOSIS — M25512 Pain in left shoulder: Secondary | ICD-10-CM | POA: Diagnosis not present

## 2020-03-05 DIAGNOSIS — M7582 Other shoulder lesions, left shoulder: Secondary | ICD-10-CM | POA: Diagnosis not present

## 2020-03-08 ENCOUNTER — Other Ambulatory Visit: Payer: Self-pay | Admitting: Family

## 2020-04-01 ENCOUNTER — Other Ambulatory Visit: Payer: Self-pay | Admitting: Family

## 2020-04-08 DIAGNOSIS — F200 Paranoid schizophrenia: Secondary | ICD-10-CM | POA: Diagnosis not present

## 2020-04-14 ENCOUNTER — Telehealth: Payer: Self-pay | Admitting: Family

## 2020-04-14 DIAGNOSIS — Z20818 Contact with and (suspected) exposure to other bacterial communicable diseases: Secondary | ICD-10-CM | POA: Diagnosis not present

## 2020-04-14 NOTE — Telephone Encounter (Signed)
[facility] Report/Call: 04/11/20. ---Caller states that her son is having shortness of breath and coughing. He doesn't have a fever. He did vomit Wednesday after coughing, but not since then.   Advised go to ED now.  I do not see patient went to ED.

## 2020-04-17 ENCOUNTER — Telehealth: Payer: Self-pay | Admitting: Family

## 2020-04-17 NOTE — Telephone Encounter (Signed)
    Patient has tested positive for COVID, seeking advice to help with body aches Currently doing neb treatments/ albuterol.  Call spouse [phone]

## 2020-04-20 NOTE — Telephone Encounter (Signed)
Please call and check on him; need to discuss how CMA handled message?

## 2020-04-21 ENCOUNTER — Other Ambulatory Visit: Payer: Self-pay | Admitting: Family

## 2020-04-21 MED ORDER — ALBUTEROL SULFATE (2.5 MG/3ML) 0.083% IN NEBU
2.5000 mg | INHALATION_SOLUTION | Freq: Four times a day (QID) | RESPIRATORY_TRACT | 1 refills | Status: DC | PRN
Start: 2020-04-21 — End: 2020-06-06

## 2020-05-22 DIAGNOSIS — M5412 Radiculopathy, cervical region: Secondary | ICD-10-CM | POA: Diagnosis not present

## 2020-05-29 ENCOUNTER — Ambulatory Visit: Payer: Medicare HMO | Admitting: Pulmonary Disease

## 2020-05-29 ENCOUNTER — Other Ambulatory Visit: Payer: Self-pay

## 2020-05-29 ENCOUNTER — Encounter: Payer: Self-pay | Admitting: Pulmonary Disease

## 2020-05-29 VITALS — BP 126/80 | HR 114 | Temp 97.9°F | Ht 68.0 in | Wt 243.0 lb

## 2020-05-29 DIAGNOSIS — J449 Chronic obstructive pulmonary disease, unspecified: Secondary | ICD-10-CM

## 2020-05-29 DIAGNOSIS — F17210 Nicotine dependence, cigarettes, uncomplicated: Secondary | ICD-10-CM

## 2020-05-29 DIAGNOSIS — R911 Solitary pulmonary nodule: Secondary | ICD-10-CM | POA: Diagnosis not present

## 2020-05-29 DIAGNOSIS — Z720 Tobacco use: Secondary | ICD-10-CM

## 2020-05-29 MED ORDER — ALBUTEROL SULFATE HFA 108 (90 BASE) MCG/ACT IN AERS: 2.0000 | INHALATION_SPRAY | Freq: Four times a day (QID) | RESPIRATORY_TRACT | 6 refills | Status: DC | PRN

## 2020-05-29 NOTE — Progress Notes (Signed)
Subjective:   PATIENT ID: [name] GENDER: male DOB: Jun 26, 1955, MRN: [id number]   HPI  Chief Complaint  Patient presents with  . Follow-up    Wanting to quit smoking. Inhalers working well. No complaints about breathing.     Reason for Visit: Follow-up COPD and active smoker  [name] is a 65 year old male active smoker with schizophrenia, depression/anxiety, hypertension, reflux and allergic rhinitis who presents for follow-up.  He was last seen by me on 10/2018 and last seen in Pulmonary clinic with NP on 02/2019. He also did not follow-up for his annual lung screening follow-up as recommended in 2021. He has been compliant with his Symbicort. He uses his albuterol sometimes up to twice a day with heavy exertion but most of time his breathing is well-controlled on his Symbicort. Denies shortness of breath, cough or wheezing unless he really exerts himself which is improved with albuterol. He wishes to quit smoking and requested Chantix. However his provider (unknown if Psych or PCP) declined due to his history of schizophrenia which he reports has been well-controlled for years. He is currently on Prolixin and Cogentin. He smokes 1.5-2 packs per day. His pain triggers him to start smoking so that he can relax.   Social History: Maternal cousin passed away from lung cancer in his 40s, nonsmoker Smoking since he was 62.  Smokes 2 packs/day for last 50 years.   I have personally reviewed patient's past medical/family/social history/allergies/current medications.  Past Medical History:  Diagnosis Date  . ALLERGIC RHINITIS 01/25/2007  . ANXIETY DISORDER, GENERALIZED 01/25/2007  . Arthritis   . Chronic kidney disease    stage 3  . DEPRESSION 01/25/2007  . ESOPHAGITIS 12/28/2007  . Fatty liver 10/09/2010  . GERD 01/25/2007  . GLUCOSE INTOLERANCE, HX OF 01/25/2007  . Heart murmur    hx of   . HEMORRHOIDS, INTERNAL, WITH BLEEDING 04/26/2008  . Hiatal hernia   .  HYPERLIPIDEMIA 12/27/2007  . HYPERTENSION, ESSENTIAL NOS 01/25/2007  . Hyperthyroidism 08/11/2010  . HYPERTROPHY PROSTATE W/UR OBST & OTH LUTS 02/27/2010  . 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 LOW BACK PAIN, CHRONIC 11/08/2007  . Pancreatitis   . Prostate cancer ([place])   . Rectal abscess   . 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 SCOLIOSIS NEC 01/25/2007  . Sebaceous cyst 08/10/2010  . SMOKER 08/05/2009  . UPPER GASTROINTESTINAL HEMORRHAGE 01/25/2007     Allergies  Allergen Reactions  . Celecoxib Other (See Comments)     rectal bleeding  . Iohexol Other (See Comments)    Affects nerves: triggers schizophrenia  . Ivp Dye [Iodinated Diagnostic Agents]   . Metoclopramide Hcl Other (See Comments)    Affects nerves: triggers schizophrenia  . Ritalin [Methylphenidate]   . Wellbutrin [Bupropion] Other (See Comments)    Makes pt smoke heavily     Outpatient Medications Prior to Visit  Medication Sig Dispense Refill  . acetaminophen (TYLENOL) 500 MG tablet Take 1,000 mg by mouth every 6 (six) hours as needed for moderate pain.     [name] albuterol (PROVENTIL) (2.5 MG/3ML) 0.083% nebulizer solution Take 3 mLs (2.5 mg total) by nebulization every 6 (six) hours as needed for wheezing or shortness of breath. 150 mL 1  . albuterol (VENTOLIN HFA) 108 (90 Base) MCG/ACT inhaler Inhale 2 puffs into the lungs every 6 (six) hours as needed for wheezing or shortness of breath. 8 g 6  . amLODipine (NORVASC) 10 MG tablet TAKE 1 TABLET BY MOUTH EVERY DAY  90 tablet 2  . benztropine (COGENTIN) 0.5 MG tablet Take 0.5 mg by mouth 2 (two) times daily.     . Blood Pressure Monitoring (BLOOD PRESSURE CUFF) MISC Use daily as directed to check blood pressure 1 each 0  . budesonide-formoterol (SYMBICORT) 160-4.5 MCG/ACT inhaler Inhale 2 puffs into the lungs in the morning and at bedtime. TAKE 2 PUFFS BY MOUTH TWICE A DAY 30.6 g 1  . cyclobenzaprine (FLEXERIL) 10 MG tablet TAKE 1 TABLET BY MOUTH EVERYDAY AT BEDTIME 30  tablet 3  . diclofenac Sodium (VOLTAREN) 1 % GEL Apply 2 g topically 4 (four) times daily. (Patient taking differently: Apply 2 g topically 4 (four) times daily as needed (pain).) 150 g 1  . esomeprazole (NEXIUM) 40 MG capsule TAKE 1 CAPSULE BY MOUTH EVERY DAY 90 capsule 3  . fluPHENAZine (PROLIXIN) 10 MG tablet Take 2 tablets (20 mg total) by mouth daily. 60 tablet 0  . fluticasone (FLONASE) 50 MCG/ACT nasal spray SPRAY 2 SPRAYS INTO EACH NOSTRIL EVERY DAY 48 mL 2  . gabapentin (NEURONTIN) 300 MG capsule TAKE 1 CAPSULE AT BEDTIME AS DIRECTED 90 capsule 3  . KLOR-CON M20 20 MEQ tablet Take 1 tablet (20 mEq total) by mouth daily. 90 tablet 2  . losartan (COZAAR) 100 MG tablet TAKE 1 TABLET BY MOUTH EVERY DAY (Patient taking differently: Take 100 mg by mouth daily.) 90 tablet 2  . naproxen sodium (ALEVE) 220 MG tablet Take 440 mg by mouth daily as needed (pain).     [name] oxyCODONE-acetaminophen (PERCOCET/ROXICET) 5-325 MG tablet Take 1 tablet by mouth every 6 (six) hours as needed for pain.     . tamsulosin (FLOMAX) 0.4 MG CAPS capsule Take 0.4 mg by mouth daily.     No facility-administered medications prior to visit.    Review of Systems  Constitutional: Negative for chills, diaphoresis, fever, malaise/fatigue and weight loss.  HENT: Negative for congestion.   Respiratory: Negative for cough, hemoptysis, sputum production, shortness of breath and wheezing.   Cardiovascular: Negative for chest pain, palpitations and leg swelling.     Objective:   Vitals:   05/29/20 0930  BP: 126/80  Pulse: (!) 114  Temp: 97.9 F (36.6 C)  SpO2: 98%  Weight: 243 lb (110.2 kg)  Height: 5\' 8"  (1.727 m)   SpO2: 98 % O2 Device: None (Room air)  Physical Exam: General: Well-appearing, no acute distress HENT: [place], AT Eyes: EOMI, no scleral icterus Respiratory: Diminished breath sounds bilaterally.  No crackles, wheezing or rales Cardiovascular: RRR, -M/R/G, no  JVD Extremities:-Edema,-tenderness Neuro: AAO x4, CNII-XII grossly intact Skin: Intact, no rashes or bruising Psych: Anxious mood, normal affect  Data Reviewed:  Imaging: CXR 04/12/2018-no infiltrate, effusion or edema. CT Lung Screen 12/17/19 - Mild centrilobular emphysema. Right apical nodule increased from 2.8>5.22mm compared to 11/2018.  PFT: None on file  Labs: CBC and CMP 10/24/2018-normal WBC 8.1 and Hg 14.6 Imaging, labs and tests noted above have been reviewed independently by me.  Imaging, labs and test noted above have been reviewed independently by me.    Assessment & Plan:   Discussion: 65 year old male active smoker with emphysema (no PFTs), schizophrenia, depression and anxiety who presents for follow-up.   Right apical lung nodule - discussed enlarging nodule that warrants further evaluation. Due to the size of the lesion, repeat imaging would be appropriate for subcentimeter nodule. If sampling is required in the future, will need to consider PET to evaluate if he is a surgical  candidate for possible stage I malignancy --ORDER CT Chest Lung Screen  Tobacco abuse --Patient is an active smoker. Advised to reduce smoking to only ONE pack a day --Please CONTACT your psychiatry provider to send records to [place]. Also see if they can call our office to discuss care with me (Dr. Rodman Pickle) regarding safety of medications for smoking cessation --We discussed smoking cessation for >10 minutes. We discussed triggers and stressors and ways to deal with them. We discussed barriers to continued smoking and benefits of smoking cessation. He is not willing to try patches, gum or lozenges. Provided patient with information cessation techniques and interventions including [place] quitline.  COPD --CONTINUE Symbicort 160-4.5 TWO puffs TWICE a day --CONTINUE Albuterol AS NEEDED for shortness of breath or wheezing. Refill ordered  Health Maintenance Immunization History  Administered  Date(s) Administered  . Janssen (J&J) SARS-COV-2 Vaccination 06/29/2019  . Pneumococcal Conjugate-13 10/19/2017  . Td 04/19/1992  . Tdap 10/19/2017  Patient declines influenza vaccine  CT Lung Screen - ordered  Orders Placed This Encounter  Procedures  . CT CHEST LUNG CA SCREEN LOW DOSE W/O CM    First available  243 / no spinal stimulator, body injector or glucose monitor / no needs / Statistician order/ miriam w pt 05/29/2020 no to covid ?s/ miriam Pt aware to call and cancel/reschedule within 24 hrs to avoid $75 fee.    Standing Status:   Future    Standing Expiration Date:   05/29/2021    Order Specific Question:   Reason for Exam (SYMPTOM  OR DIAGNOSIS REQUIRED)    Answer:   right lung nodule, active smoker    Order Specific Question:   Preferred Imaging Location?    Answer:   [id number]   Meds ordered this encounter  Medications  . albuterol (VENTOLIN HFA) 108 (90 Base) MCG/ACT inhaler    Sig: Inhale 2 puffs into the lungs every 6 (six) hours as needed for wheezing or shortness of breath.    Dispense:  8 g    Refill:  6    Return in about 3 months (around 08/26/2020).  I have spent a total time of 32-minutes on the day of the appointment reviewing prior documentation, coordinating care and discussing medical diagnosis and plan with the patient/family. Imaging, labs and tests included in this note have been reviewed and interpreted independently by me.   [place], MD [facility] 05/29/2020 9:49 AM  Office Number [phone]

## 2020-05-29 NOTE — Patient Instructions (Addendum)
Right lung nodule --ORDER CT Chest Lung Screen  Tobacco abuse --Patient is an active smoker. Advised to reduce smoking to only ONE pack a day --Please CONTACT your psychiatry provider to send records to [place]. Also see if they can call our office to discuss care with me (Dr. Rodman Pickle) regarding safety of medications for smoking cessation  COPD --CONTINUE Symbicort 160-4.5 TWO puffs TWICE a day --CONTINUE Albuterol AS NEEDED for shortness of breath or wheezing. Refill ordered  Follow-up with me in 2 months after CT Scan to discuss results     Tobacco Use Disorder Tobacco use disorder (TUD) occurs when a person craves, seeks, and uses tobacco, regardless of the consequences. This disorder can cause problems with mental and physical health. It can affect your ability to have healthy relationships, and it can keep you from meeting your responsibilities at work, home, or school. Tobacco may be:  Smoked as a cigarette or cigar.  Inhaled using e-cigarettes.  Smoked in a pipe or hookah.  Chewed as smokeless tobacco.  Inhaled into the nostrils as snuff. Tobacco products contain a dangerous chemical called nicotine, which is very addictive. Nicotine triggers hormones that make the body feel stimulated and works on areas of the brain that make you feel good. These effects can make it hard for people to quit nicotine. Tobacco contains many other unsafe chemicals that can damage almost every organ in the body. Smoking tobacco also puts others in danger due to fire risk and possible health problems caused by breathing in secondhand smoke. What are the signs or symptoms? Symptoms of TUD may include:  Being unable to slow down or stop your tobacco use.  Spending an abnormal amount of time getting or using tobacco.  Craving tobacco. Cravings may last for up to 6 months after quitting.  Tobacco use that: ? Interferes with your work, school, or home life. ? Interferes with your personal and  social relationships. ? Makes you give up activities that you once enjoyed or found important.  Using tobacco even though you know that it is: ? Dangerous or bad for your health or someone else's health. ? Causing problems in your life.  Needing more and more of the substance to get the same effect (developing tolerance).  Experiencing unpleasant symptoms if you do not use the substance (withdrawal). Withdrawal symptoms may include: ? Depressed, anxious, or irritable mood. ? Difficulty concentrating. ? Increased appetite. ? Restlessness or trouble sleeping.  Using the substance to avoid withdrawal. How is this diagnosed? This condition may be diagnosed based on:  Your current and past tobacco use. Your health care provider may ask questions about how your tobacco use affects your life.  A physical exam. You may be diagnosed with TUD if you have at least two symptoms within a 78-month period. How is this treated? This condition is treated by stopping tobacco use. Many people are unable to quit on their own and need help. Treatment may include:  Nicotine replacement therapy (NRT). NRT provides nicotine without the other harmful chemicals in tobacco. NRT gradually lowers the dosage of nicotine in the body and reduces withdrawal symptoms. NRT is available as: ? Over-the-counter gums, lozenges, and skin patches. ? Prescription mouth inhalers and nasal sprays.  Medicine that acts on the brain to reduce cravings and withdrawal symptoms.  A type of talk therapy that examines your triggers for tobacco use, how to avoid them, and how to cope with cravings (behavioral therapy).  Hypnosis. This may help with withdrawal  symptoms.  Joining a support group for others coping with TUD. The best treatment for TUD is usually a combination of medicine, talk therapy, and support groups. Recovery can be a long process. Many people start using tobacco again after stopping (relapse). If you relapse, it  does not mean that treatment will not work. Follow these instructions at home: Lifestyle  Do not use any products that contain nicotine or tobacco, such as cigarettes and e-cigarettes.  Avoid things that trigger tobacco use as much as you can. Triggers include people and situations that usually cause you to use tobacco.  Avoid drinks that contain caffeine, including coffee. These may worsen some withdrawal symptoms.  Find ways to manage stress. Wanting to smoke may cause stress, and stress can make you want to smoke. Relaxation techniques such as deep breathing, meditation, and yoga may help.  Attend support groups as needed. These groups are an important part of long-term recovery for many people. General instructions  Take over-the-counter and prescription medicines only as told by your health care provider.  Check with your health care provider before taking any new prescription or over-the-counter medicines.  Decide on a friend, family member, or smoking quit-line (such as 1-800-QUIT-NOW in the [place] that you can call or text when you feel the urge to smoke or when you need help coping with cravings.  Keep all follow-up visits as told by your health care provider and therapist. This is important.   Contact a health care provider if:  You are not able to take your medicines as prescribed.  Your symptoms get worse, even with treatment. Summary  Tobacco use disorder (TUD) occurs when a person craves, seeks, and uses tobacco regardless of the consequences.  This condition may be diagnosed based on your current and past tobacco use and a physical exam.  Many people are unable to quit on their own and need help. Recovery can be a long process.  The most effective treatment for TUD is usually a combination of medicine, talk therapy, and support groups. This information is not intended to replace advice given to you by your health care provider. Make sure you discuss any questions you  have with your health care provider. Document Revised: 03/23/2017 Document Reviewed: 03/23/2017 Elsevier Patient Education  2021 [employer].

## 2020-05-31 ENCOUNTER — Encounter: Payer: Self-pay | Admitting: Pulmonary Disease

## 2020-06-05 ENCOUNTER — Other Ambulatory Visit: Payer: Self-pay | Admitting: Family

## 2020-06-05 ENCOUNTER — Other Ambulatory Visit: Payer: Self-pay | Admitting: Pulmonary Disease

## 2020-06-05 DIAGNOSIS — M5412 Radiculopathy, cervical region: Secondary | ICD-10-CM | POA: Diagnosis not present

## 2020-06-05 DIAGNOSIS — M47812 Spondylosis without myelopathy or radiculopathy, cervical region: Secondary | ICD-10-CM | POA: Diagnosis not present

## 2020-06-05 DIAGNOSIS — M4802 Spinal stenosis, cervical region: Secondary | ICD-10-CM | POA: Diagnosis not present

## 2020-06-10 ENCOUNTER — Other Ambulatory Visit: Payer: Self-pay

## 2020-06-10 ENCOUNTER — Telehealth: Payer: Self-pay | Admitting: Family

## 2020-06-10 DIAGNOSIS — M5412 Radiculopathy, cervical region: Secondary | ICD-10-CM | POA: Diagnosis not present

## 2020-06-10 DIAGNOSIS — M542 Cervicalgia: Secondary | ICD-10-CM | POA: Diagnosis not present

## 2020-06-10 MED ORDER — VALSARTAN 160 MG PO TABS
160.0000 mg | ORAL_TABLET | Freq: Every day | ORAL | 0 refills | Status: DC
Start: 2020-06-10 — End: 2020-08-04

## 2020-06-10 NOTE — Telephone Encounter (Signed)
There is a Psychologist, prison and probation services on Losartan; changing to Diovan  (Valsartan) 160 mg daily;

## 2020-06-12 NOTE — Telephone Encounter (Signed)
Pt's wife notified of the medication change & verb understanding.

## 2020-06-13 ENCOUNTER — Ambulatory Visit: Payer: Medicare HMO

## 2020-06-24 ENCOUNTER — Other Ambulatory Visit: Payer: Self-pay | Admitting: Family

## 2020-07-02 ENCOUNTER — Ambulatory Visit
Admission: RE | Admit: 2020-07-02 | Discharge: 2020-07-02 | Disposition: A | Discharge status: Home / Self Care | Payer: Medicare HMO | Source: Ambulatory Visit | Attending: Pulmonary Disease | Admitting: Pulmonary Disease

## 2020-07-02 ENCOUNTER — Other Ambulatory Visit: Payer: Self-pay

## 2020-07-02 DIAGNOSIS — Z87891 Personal history of nicotine dependence: Secondary | ICD-10-CM

## 2020-07-02 DIAGNOSIS — F17210 Nicotine dependence, cigarettes, uncomplicated: Secondary | ICD-10-CM

## 2020-07-02 DIAGNOSIS — R918 Other nonspecific abnormal finding of lung field: Secondary | ICD-10-CM | POA: Diagnosis not present

## 2020-07-03 NOTE — Progress Notes (Signed)
Dr. Loanne Drilling and Dr. Valeta Harms,  This is a patient of [facility] we have been following.  This patient has a 4A nodule. This was seen 11/2019. A 3 month follow up CT as ordered. He did not get this done. We did finally get him in 06/2020. The nodule of concern in the right apex of his lung is slowly growing. In August 2021 it was 5.2 mm , and it is now 6.9 mm. Still too small to PET, and most likely difficult to biopsy. Are you both o.k with waiting another 3 months? I am suspicious that this may be an adeno, but due to size a 3 month follow up may be ok. We will just have to tell him he cannot wait beyond the 3 months this time.  He will also need PFT's , as he has not had any dome that I can see.  Let me know your thoughts. Thanks so much

## 2020-07-07 DIAGNOSIS — N1831 Chronic kidney disease, stage 3a: Secondary | ICD-10-CM | POA: Diagnosis not present

## 2020-07-08 ENCOUNTER — Other Ambulatory Visit (HOSPITAL_COMMUNITY)
Admission: RE | Admit: 2020-07-08 | Discharge: 2020-07-08 | Disposition: A | Discharge status: Home / Self Care | Payer: Medicare HMO | Source: Ambulatory Visit | Attending: Dentistry | Admitting: Dentistry

## 2020-07-08 ENCOUNTER — Telehealth: Payer: Self-pay | Admitting: Pulmonary Disease

## 2020-07-08 ENCOUNTER — Other Ambulatory Visit: Payer: Self-pay | Admitting: *Deleted

## 2020-07-08 DIAGNOSIS — Z01812 Encounter for preprocedural laboratory examination: Secondary | ICD-10-CM | POA: Insufficient documentation

## 2020-07-08 DIAGNOSIS — Z20822 Contact with and (suspected) exposure to covid-19: Secondary | ICD-10-CM | POA: Insufficient documentation

## 2020-07-08 DIAGNOSIS — J449 Chronic obstructive pulmonary disease, unspecified: Secondary | ICD-10-CM

## 2020-07-08 LAB — SARS CORONAVIRUS 2 (TAT 6-24 HRS): SARS Coronavirus 2: NEGATIVE

## 2020-07-08 NOTE — Telephone Encounter (Signed)
Called spoke with patient.  He is scheduled for the 25th at 9am for PFT he is going to [facility] location today at 2:55 for covid testing.   Will route to [place] as FYI

## 2020-07-08 NOTE — Telephone Encounter (Signed)
[place] Telephone Encounter  I have contacted patient to discuss his enlarging right apical lung nodule. In 11/2018 lesion was 2.8 mm>5.48mm in 11/2019. Recent imaging 06/2020 measured 6.110mm. He is aware that we plan for pulmonary function tests and that thoracic surgery will be contacting him. We discussed smoking cessation and he plans to start using nicotine patches since he is not eligible for chantix or wellbutrin on his current medical regimen per psychiatry.   Plan Will arrange for PFTs when next available Staff message sent to thoracic surgery to schedule evaluation  Rodman Pickle, M.D. [facility] 07/08/2020 10:50 AM

## 2020-07-08 NOTE — Telephone Encounter (Signed)
I have called and LM on VM for pt to call back and schedule his PFT for next opening.

## 2020-07-10 NOTE — Progress Notes (Signed)
I have tried calling this patient with the results of his low dose screening CT. There was no answer on either his home phone or his mobile phone. I have left a HIPPA compliant message with the office contact information, and requested that the patient call the office so that we can discuss the results of his scan.   Thanks so much

## 2020-07-11 ENCOUNTER — Other Ambulatory Visit: Payer: Self-pay

## 2020-07-11 ENCOUNTER — Ambulatory Visit (INDEPENDENT_AMBULATORY_CARE_PROVIDER_SITE_OTHER): Payer: Medicare HMO | Admitting: Pulmonary Disease

## 2020-07-11 ENCOUNTER — Other Ambulatory Visit: Payer: Self-pay | Admitting: Pulmonary Disease

## 2020-07-11 DIAGNOSIS — J449 Chronic obstructive pulmonary disease, unspecified: Secondary | ICD-10-CM | POA: Diagnosis not present

## 2020-07-11 LAB — PULMONARY FUNCTION TEST
DL/VA % pred: 101 %
DL/VA: 4.26 ml/min/mmHg/L
DLCO cor % pred: 89 %
DLCO cor: 22.9 ml/min/mmHg
DLCO unc % pred: 89 %
DLCO unc: 22.9 ml/min/mmHg
FEF 25-75 Post: 2.27 L/sec
FEF 25-75 Pre: 1.97 L/sec
FEF2575-%Change-Post: 14 %
FEF2575-%Pred-Post: 87 %
FEF2575-%Pred-Pre: 75 %
FEV1-%Change-Post: 6 %
FEV1-%Pred-Post: 92 %
FEV1-%Pred-Pre: 86 %
FEV1-Post: 2.6 L
FEV1-Pre: 2.43 L
FEV1FVC-%Change-Post: 6 %
FEV1FVC-%Pred-Pre: 89 %
FEV6-%Change-Post: -2 %
FEV6-%Pred-Post: 97 %
FEV6-%Pred-Pre: 99 %
FEV6-Post: 3.42 L
FEV6-Pre: 3.51 L
FEV6FVC-%Pred-Post: 104 %
FEV6FVC-%Pred-Pre: 104 %
FVC-%Change-Post: 0 %
FVC-%Pred-Post: 96 %
FVC-%Pred-Pre: 95 %
FVC-Post: 3.52 L
FVC-Pre: 3.51 L
Post FEV1/FVC ratio: 74 %
Post FEV6/FVC ratio: 100 %
Pre FEV1/FVC ratio: 69 %
Pre FEV6/FVC Ratio: 100 %
RV % pred: 90 %
RV: 1.99 L
TLC % pred: 85 %
TLC: 5.62 L

## 2020-07-11 NOTE — Patient Instructions (Signed)
Full PFT performed today. °

## 2020-07-11 NOTE — Progress Notes (Signed)
Full PFT performed today. Both Pleth and Nitrogen washout performed.

## 2020-07-14 NOTE — Telephone Encounter (Signed)
Per pharmacy rx filled 2/15 but now rx has expired without reason. Rx resent.

## 2020-07-15 ENCOUNTER — Other Ambulatory Visit: Payer: Self-pay

## 2020-07-15 ENCOUNTER — Institutional Professional Consult (permissible substitution) (INDEPENDENT_AMBULATORY_CARE_PROVIDER_SITE_OTHER): Payer: Medicare HMO | Admitting: Thoracic Surgery (Cardiothoracic Vascular Surgery)

## 2020-07-15 ENCOUNTER — Encounter: Payer: Self-pay | Admitting: Thoracic Surgery (Cardiothoracic Vascular Surgery)

## 2020-07-15 VITALS — BP 137/58 | HR 100 | Temp 97.0°F | Resp 20 | Ht 69.0 in | Wt 243.0 lb

## 2020-07-15 DIAGNOSIS — R911 Solitary pulmonary nodule: Secondary | ICD-10-CM

## 2020-07-15 NOTE — Progress Notes (Signed)
[address]       [place] [address]             [phone]                    [facility] Medical Record #[id number] Date of Birth: 04-30-1955  Referring: Garner Nash, DO Primary Care: Marrian Salvage, [place] Primary Cardiologist: No primary care provider on file.  Chief Complaint:    Chief Complaint  Patient presents with  . Lung Lesion    New patient consultation, PFTs 07/11/20, Chest CT 07/03/20    History of Present Illness:    [name] 65 y.o. male presents for surgical evaluation of a 6.9 mm right upper lobe pulmonary nodule that originally was identified during lung cancer screening.  Over the last year there has been interval growth to 5.2 mm.  He is an ongoing smoker who uses about a pack and 1/2/day but has been unable to quit.  He recently tried nicotine patches, but continued to smoke and experienced chest pain.  Regards to his symptoms he occasionally has some chest pain with exertion.  When he walks about a block he experiences some chest pain and shortness of breath along with hip pain.  His weight has been stable and he denies any neurologic symptoms.  He does have a history of schizophrenia, but since being on his medications the symptoms have been stable.    Smoking Hx: He has been smoking since the age of 74   Zubrod Score: At the time of surgery this patient's most appropriate activity status/level should be described as: []     0    Normal activity, no symptoms [x]     1    Restricted in physical strenuous activity but ambulatory, able to do out light work []     2    Ambulatory and capable of self care, unable to do work activities, up and about               >50 % of waking hours                              []     3    Only limited self care, in bed greater than 50% of waking hours []     4    Completely disabled, no self care, confined to bed or chair []     5    Moribund   Past Medical History:   Diagnosis Date  . ALLERGIC RHINITIS 01/25/2007  . ANXIETY DISORDER, GENERALIZED 01/25/2007  . Arthritis   . Chronic kidney disease    stage 3  . DEPRESSION 01/25/2007  . ESOPHAGITIS 12/28/2007  . Fatty liver 10/09/2010  . GERD 01/25/2007  . GLUCOSE INTOLERANCE, HX OF 01/25/2007  . Heart murmur    hx of   . HEMORRHOIDS, INTERNAL, WITH BLEEDING 04/26/2008  . Hiatal hernia   . HYPERLIPIDEMIA 12/27/2007  . HYPERTENSION, ESSENTIAL NOS 01/25/2007  . Hyperthyroidism 08/11/2010  . HYPERTROPHY PROSTATE W/UR OBST & OTH LUTS 02/27/2010  . 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 LOW BACK PAIN, CHRONIC 11/08/2007  . Pancreatitis   . Prostate cancer ([place])   . Rectal abscess   . 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 SCOLIOSIS NEC 01/25/2007  . Sebaceous cyst 08/10/2010  . SMOKER 08/05/2009  . UPPER GASTROINTESTINAL HEMORRHAGE 01/25/2007    Past Surgical  History:  Procedure Laterality Date  . ABDOMINAL EXPLORATION SURGERY  1988  . CHOLECYSTECTOMY  05/20/08   Dr. Zella Richer  . COLONOSCOPY  04/23/2010   normal  . ESOPHAGOGASTRODUODENOSCOPY N/A 12/31/2013   Procedure: ESOPHAGOGASTRODUODENOSCOPY (EGD);  Surgeon: Gatha Mayer, MD;  Location: [facility];  Service: Endoscopy;  Laterality: N/A;  . Excision of scalp lesion  07/2009  . Excision of thyroid mass    . GANGLION CYST EXCISION     right foot x 2  . HERNIA REPAIR     ventral  . INCISIONAL HERNIA REPAIR  12/01/2011   Procedure: LAPAROSCOPIC INCISIONAL HERNIA;  Surgeon: Harl Bowie, MD;  Location: [facility];  Service: General;  Laterality: N/A;  . SHOULDER SURGERY     right    Family History  Problem Relation Age of Onset  . Hypertension Father   . Kidney disease Father   . Hypertension Mother   . Bone cancer Mother   . Prostate cancer Other   . Kidney disease Brother   . Colon cancer Neg Hx      Social History   Tobacco Use  Smoking Status Current Every Day Smoker  . Packs/day: 1.50  . Years: 49.00  . Pack years: 73.50  .  Types: Cigarettes  . Start date: 3  Smokeless Tobacco Never Used  Tobacco Comment   04/29/19    Social History   Substance and Sexual Activity  Alcohol Use No     Allergies  Allergen Reactions  . Celecoxib Other (See Comments)     rectal bleeding  . Iohexol Other (See Comments)    Affects nerves: triggers schizophrenia  . Ivp Dye [Iodinated Diagnostic Agents]   . Metoclopramide Hcl Other (See Comments)    Affects nerves: triggers schizophrenia  . Ritalin [Methylphenidate]   . Wellbutrin [Bupropion] Other (See Comments)    Makes pt smoke heavily    Current Outpatient Medications  Medication Sig Dispense Refill  . acetaminophen (TYLENOL) 500 MG tablet Take 1,000 mg by mouth every 6 (six) hours as needed for moderate pain.     [name] albuterol (PROVENTIL) (2.5 MG/3ML) 0.083% nebulizer solution INHALE 3 ML BY NEBULIZATION EVERY 6 HOURS AS NEEDED FOR WHEEZING OR SHORTNESS OF BREATH 150 mL 1  . albuterol (VENTOLIN HFA) 108 (90 Base) MCG/ACT inhaler TAKE 2 PUFFS BY MOUTH EVERY 6 HOURS AS NEEDED FOR WHEEZE OR SHORTNESS OF BREATH 8.5 each 6  . amLODipine (NORVASC) 10 MG tablet TAKE 1 TABLET BY MOUTH EVERY DAY 90 tablet 2  . benztropine (COGENTIN) 0.5 MG tablet Take 0.5 mg by mouth 2 (two) times daily.     . Blood Pressure Monitoring (BLOOD PRESSURE CUFF) MISC Use daily as directed to check blood pressure 1 each 0  . budesonide-formoterol (SYMBICORT) 160-4.5 MCG/ACT inhaler Inhale 2 puffs into the lungs in the morning and at bedtime. TAKE 2 PUFFS BY MOUTH TWICE A DAY 30.6 g 1  . cyclobenzaprine (FLEXERIL) 10 MG tablet TAKE 1 TABLET BY MOUTH EVERYDAY AT BEDTIME 30 tablet 3  . diclofenac Sodium (VOLTAREN) 1 % GEL Apply 2 g topically 4 (four) times daily. (Patient taking differently: Apply 2 g topically 4 (four) times daily as needed (pain).) 150 g 1  . esomeprazole (NEXIUM) 40 MG capsule TAKE 1 CAPSULE BY MOUTH EVERY DAY 90 capsule 3  . fluPHENAZine (PROLIXIN) 10 MG tablet Take 2 tablets (20  mg total) by mouth daily. 60 tablet 0  . fluticasone (FLONASE) 50 MCG/ACT nasal spray SPRAY 2 SPRAYS  INTO EACH NOSTRIL EVERY DAY 48 mL 2  . gabapentin (NEURONTIN) 300 MG capsule TAKE 1 CAPSULE AT BEDTIME AS DIRECTED 90 capsule 3  . KLOR-CON M20 20 MEQ tablet Take 1 tablet (20 mEq total) by mouth daily. 90 tablet 2  . naproxen sodium (ALEVE) 220 MG tablet Take 440 mg by mouth daily as needed (pain).     [name] oxyCODONE-acetaminophen (PERCOCET/ROXICET) 5-325 MG tablet Take 1 tablet by mouth every 6 (six) hours as needed for pain.     . tamsulosin (FLOMAX) 0.4 MG CAPS capsule Take 0.4 mg by mouth daily.    . valsartan (DIOVAN) 160 MG tablet Take 1 tablet (160 mg total) by mouth daily. 90 tablet 0   No current facility-administered medications for this visit.    Review of Systems  Constitutional: Negative.   Respiratory: Positive for shortness of breath.   Cardiovascular: Positive for chest pain.  Neurological: Positive for loss of consciousness.       Passes out after long coughing spell.  Psychiatric/Behavioral: The patient is nervous/anxious.      PHYSICAL EXAMINATION: BP (!) 137/58 (BP Location: Left Arm, Patient Position: Sitting, Cuff Size: Large)   Pulse 100   Temp (!) 97 F (36.1 C) (Skin)   Resp 20   Ht 5\' 9"  (1.753 m)   Wt 243 lb (110.2 kg)   SpO2 98% Comment: RA  BMI 35.88 kg/m  Physical Exam Constitutional:      Appearance: Normal appearance. He is normal weight.  HENT:     Head: Normocephalic.  Eyes:     Extraocular Movements: Extraocular movements intact.  Cardiovascular:     Rate and Rhythm: Regular rhythm. Tachycardia present.  Pulmonary:     Effort: Pulmonary effort is normal. No respiratory distress.  Abdominal:     General: There is no distension.  Musculoskeletal:        General: Normal range of motion.     Cervical back: Normal range of motion.  Skin:    General: Skin is warm and dry.  Neurological:     General: No focal deficit present.     Mental  Status: He is alert and oriented to person, place, and time.     Diagnostic Studies & Laboratory data:     Recent Radiology Findings:   CT CHEST LCS NODULE F/U W/O CONTRAST  Result Date: 07/03/2020 CLINICAL DATA:  Lung cancer screening. One hundred nineteen pack-year history. Current asymptomatic smoker EXAM: CT CHEST WITHOUT CONTRAST FOR LUNG CANCER SCREENING NODULE FOLLOW-UP TECHNIQUE: Multidetector CT imaging of the chest was performed following the standard protocol without IV contrast. COMPARISON:  12/17/2019 FINDINGS: Cardiovascular: Heart size is within normal limits. Aortic atherosclerosis. Coronary artery atherosclerotic calcifications. Mediastinum/Nodes: No enlarged mediastinal, hilar, or axillary lymph nodes. Thyroid gland, trachea, and esophagus demonstrate no significant findings. Lungs/Pleura: Mild centrilobular emphysema. No pleural effusion, airspace consolidation, or atelectasis. Small scattered lung nodules are again noted. The largest nodule is in the periphery of the right apex with a mean derived diameter of 6.9 mm, image 58/3 and coronal image 245/5. On 12/17/2019 this measured previously 5.2 mm. This continues to exhibit slow interval growth when compared with 12/11/2018 and 12/17/2019. Other small lung nodules are stable. No new lung nodules. Upper Abdomen: No acute abnormality. Musculoskeletal: No chest wall mass or suspicious bone lesions identified. IMPRESSION: 1. Lung-RADS 4A, suspicious. Follow up low-dose chest CT without contrast in 3 months (please use the following order, "CT CHEST LCS NODULE FOLLOW-UP W/O CM") is recommended. Alternatively,  PET may be considered when there is a solid component 102mm or larger. 2. Coronary artery calcifications. Aortic Atherosclerosis (ICD10-I70.0) and Emphysema (ICD10-J43.9). Electronically Signed   By: Kerby Moors M.D.   On: 07/03/2020 09:31       I have independently reviewed the above radiology studies  and reviewed the findings  with the patient.   Recent Lab Findings: Lab Results  Component Value Date   WBC 13.7 (H) 10/16/2019   HGB 14.8 10/16/2019   HCT 45.5 10/16/2019   PLT 327 10/16/2019   GLUCOSE 90 10/16/2019   CHOL 170 10/24/2018   TRIG 148.0 10/24/2018   HDL 41.30 10/24/2018   LDLDIRECT 168.4 03/05/2014   LDLCALC 99 10/24/2018   ALT 19 10/16/2019   AST 15 10/16/2019   NA 141 10/16/2019   K 4.1 10/16/2019   CL 101 10/16/2019   CREATININE 1.33 (H) 10/16/2019   BUN 7 (L) 10/16/2019   CO2 31 10/16/2019   TSH 1.43 10/19/2017   INR 1.05 12/31/2013   HGBA1C 5.7 08/01/2014     PFTs: - FVC: 95% - FEV1: 86% -DLCO: 89%  Problem List: 6.9 mm right upper lobe pulmonary nodule Calcifications on CT.  Exertional dyspnea and angina History of schizophrenia Ongoing smoking.  Assessment / Plan:   This is a 65 year old gentleman with a 6.9 mm right upper lobe pulmonary nodule.  He has multiple medical problems including schizophrenia for which he has been using cigarettes to help with his anxiety for several decades.  He has been unable to stop smoking, and the last time he used nicotine patches he experienced chest pain to combining it with cigarettes.  Based off of the images and the size of the nodule I do not think that head CT would yield useful information, and any type of needle biopsy would be very challenging.  His only option would be a surgical biopsy.  Given his significant ongoing smoking I cannot recommend surgical biopsy at this point.  We will reach out to his psychiatrist to see if there is any techniques to get him off of cigarettes including hypnosis per his suggestion.  I have ordered a repeat CT chest in 3 months and will see him in follow-up.     I  spent 40 minutes with  the patient face to face in counseling and coordination of care.    Lajuana Matte 07/15/2020 2:27 PM

## 2020-07-17 DIAGNOSIS — E559 Vitamin D deficiency, unspecified: Secondary | ICD-10-CM | POA: Diagnosis not present

## 2020-07-17 DIAGNOSIS — I129 Hypertensive chronic kidney disease with stage 1 through stage 4 chronic kidney disease, or unspecified chronic kidney disease: Secondary | ICD-10-CM | POA: Diagnosis not present

## 2020-07-17 DIAGNOSIS — F209 Schizophrenia, unspecified: Secondary | ICD-10-CM | POA: Diagnosis not present

## 2020-07-17 DIAGNOSIS — N1831 Chronic kidney disease, stage 3a: Secondary | ICD-10-CM | POA: Diagnosis not present

## 2020-07-17 NOTE — Progress Notes (Signed)
These results were called to the patient by Dr. Loanne Drilling. He has been seen by thoracic surgery for evaluation of nodules that are small, but increasing in size . He is being referred for hypnosis to see if he can quit smoking. Dr. Kipp Brood feels best option for treatment is surgical resection but only if the patient quits smoking. Langley Gauss, please order 3 month follow up CT if not already done, fax results to PCP ( let her know he has already been seen by surgery) . If CT has been ordered under aother MD name, please make sure I get the results also, so we can continue to follow . Thanks so much

## 2020-07-18 ENCOUNTER — Encounter: Payer: Self-pay | Admitting: *Deleted

## 2020-07-18 ENCOUNTER — Other Ambulatory Visit: Payer: Self-pay | Admitting: *Deleted

## 2020-07-18 DIAGNOSIS — Z87891 Personal history of nicotine dependence: Secondary | ICD-10-CM

## 2020-07-18 DIAGNOSIS — F17210 Nicotine dependence, cigarettes, uncomplicated: Secondary | ICD-10-CM

## 2020-08-01 ENCOUNTER — Other Ambulatory Visit: Payer: Self-pay | Admitting: Family

## 2020-08-04 DIAGNOSIS — R3916 Straining to void: Secondary | ICD-10-CM | POA: Diagnosis not present

## 2020-08-05 ENCOUNTER — Other Ambulatory Visit: Payer: Self-pay | Admitting: Family

## 2020-08-06 ENCOUNTER — Other Ambulatory Visit: Payer: Self-pay | Admitting: Family

## 2020-08-12 ENCOUNTER — Other Ambulatory Visit: Payer: Self-pay

## 2020-08-12 ENCOUNTER — Encounter: Payer: Self-pay | Admitting: Pulmonary Disease

## 2020-08-12 ENCOUNTER — Ambulatory Visit (INDEPENDENT_AMBULATORY_CARE_PROVIDER_SITE_OTHER): Payer: Medicare Other | Admitting: Pulmonary Disease

## 2020-08-12 VITALS — BP 134/86 | HR 103 | Temp 97.7°F | Ht 69.0 in | Wt 250.0 lb

## 2020-08-12 DIAGNOSIS — I2510 Atherosclerotic heart disease of native coronary artery without angina pectoris: Secondary | ICD-10-CM | POA: Diagnosis not present

## 2020-08-12 DIAGNOSIS — Z87891 Personal history of nicotine dependence: Secondary | ICD-10-CM

## 2020-08-12 DIAGNOSIS — J449 Chronic obstructive pulmonary disease, unspecified: Secondary | ICD-10-CM

## 2020-08-12 DIAGNOSIS — I2584 Coronary atherosclerosis due to calcified coronary lesion: Secondary | ICD-10-CM | POA: Diagnosis not present

## 2020-08-12 MED ORDER — ALBUTEROL SULFATE HFA 108 (90 BASE) MCG/ACT IN AERS: INHALATION_SPRAY | RESPIRATORY_TRACT | 6 refills | Status: DC

## 2020-08-12 NOTE — Patient Instructions (Addendum)
Right apical lung nodule - discussed enlarging nodule that warrants further evaluation. Due to the size of the lesion, repeat imaging would be appropriate for subcentimeter nodule. If sampling is required in the future, will need to consider PET to evaluate if he is a surgical candidate for possible stage I malignancy --CT Chest low dose has been ordered by CT surgery --Keep follow-up with CT surgery  Tobacco abuse Patient is an active smoker. Down to 1 ppd We discussed smoking cessation for >10 minutes. We discussed triggers and stressors and ways to deal with them. We discussed barriers to continued smoking and benefits of smoking cessation. Provided patient with information cessation techniques and interventions including [place] quitline.  COPD --CONTINUE Symbicort 160-4.5 TWO puffs TWICE a day --CONTINUE Albuterol AS NEEDED for shortness of breath or wheezing  Coronary artery atherosclerotic calcifications on CT --Refer to Cardiology for medical optimization. Patient states he may have "heart attack" with any procedure  Follow-up in 3 months

## 2020-08-12 NOTE — Progress Notes (Signed)
Subjective:   PATIENT ID: [name] GENDER: male DOB: Aug 05, 1955, MRN: [id number]   HPI  Chief Complaint  Patient presents with  . Follow-up    No complaints. Refills on albuterol inhaler    Reason for Visit: Follow-up COPD, tobacco abuse  [name] is a 65 year old male active smoker with schizophrenia, depression/anxiety, hypertension, reflux and allergic rhinitis who presents for follow-up.  Synopsis. Seen for COPD and tobacco use. He wishes to quit smoking however his provider (unknown if Psych or PCP) declined Chantix due to his history of schizophrenia which he reports has been well-controlled for years. He is currently on Prolixin and Cogentin. He smokes 1.5-2 packs per day. His pain triggers him to start smoking so that he can relax.   08/12/20 Since our last visit, we had discussed his enlarging right apical lung nodule. In 11/2018 lesion was 2.8 mm>5.68mm in 11/2019. Recent imaging 06/2020 measured 6.51mm. He has been evaluated by CTS however surgery was not recommended in setting of active smoker. He continues to smoke 1 ppd and has limited options for cessation as he cannot tolerate nicotine patches. He is going to try the gum. Lozenges give him reflux. He is compliant with his Symbicort. Denies shortness of breath or cough. He occasionally has wheezing and uses his albuterol twice a day. He is not limited in activity except due to back and joint pain. Denies any recent exacerbations since our last visit and has not had any in the last year. He reports that he has a heart problem as well, referring to his calcifications on CT and was previously told by a provider that he would have a heart attack with any procedure. He wishes to be evaluated for this.  Social History: Maternal cousin passed away from lung cancer in his 15s, nonsmoker Smoking since he was 31.  Smokes 2 packs/day for last 50 years.   I have personally reviewed patient's past medical/family/social  history/allergies/current medications.  Past Medical History:  Diagnosis Date  . ALLERGIC RHINITIS 01/25/2007  . ANXIETY DISORDER, GENERALIZED 01/25/2007  . Arthritis   . Chronic kidney disease    stage 3  . DEPRESSION 01/25/2007  . ESOPHAGITIS 12/28/2007  . Fatty liver 10/09/2010  . GERD 01/25/2007  . GLUCOSE INTOLERANCE, HX OF 01/25/2007  . Heart murmur    hx of   . HEMORRHOIDS, INTERNAL, WITH BLEEDING 04/26/2008  . Hiatal hernia   . HYPERLIPIDEMIA 12/27/2007  . HYPERTENSION, ESSENTIAL NOS 01/25/2007  . Hyperthyroidism 08/11/2010  . HYPERTROPHY PROSTATE W/UR OBST & OTH LUTS 02/27/2010  . 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 LOW BACK PAIN, CHRONIC 11/08/2007  . Pancreatitis   . Prostate cancer ([place])   . Rectal abscess   . 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 SCOLIOSIS NEC 01/25/2007  . Sebaceous cyst 08/10/2010  . SMOKER 08/05/2009  . UPPER GASTROINTESTINAL HEMORRHAGE 01/25/2007     Allergies  Allergen Reactions  . Celecoxib Other (See Comments)     rectal bleeding  . Iohexol Other (See Comments)    Affects nerves: triggers schizophrenia  . Ivp Dye [Iodinated Diagnostic Agents]   . Metoclopramide Hcl Other (See Comments)    Affects nerves: triggers schizophrenia  . Ritalin [Methylphenidate]   . Wellbutrin [Bupropion] Other (See Comments)    Makes pt smoke heavily     Outpatient Medications Prior to Visit  Medication Sig Dispense Refill  . acetaminophen (TYLENOL) 500 MG tablet Take 1,000 mg by mouth every 6 (six) hours as needed  for moderate pain.     [name] albuterol (PROVENTIL) (2.5 MG/3ML) 0.083% nebulizer solution INHALE 3 ML BY NEBULIZATION EVERY 6 HOURS AS NEEDED FOR WHEEZING OR SHORTNESS OF BREATH 150 mL 1  . amLODipine (NORVASC) 10 MG tablet TAKE 1 TABLET BY MOUTH EVERY DAY 90 tablet 2  . benztropine (COGENTIN) 0.5 MG tablet Take 0.5 mg by mouth 2 (two) times daily.     . Blood Pressure Monitoring (BLOOD PRESSURE CUFF) MISC Use daily as directed to check blood pressure 1  each 0  . budesonide-formoterol (SYMBICORT) 160-4.5 MCG/ACT inhaler Inhale 2 puffs into the lungs in the morning and at bedtime. TAKE 2 PUFFS BY MOUTH TWICE A DAY 30.6 g 1  . cyclobenzaprine (FLEXERIL) 10 MG tablet TAKE 1 TABLET BY MOUTH EVERYDAY AT BEDTIME 30 tablet 3  . diclofenac Sodium (VOLTAREN) 1 % GEL Apply 2 g topically 4 (four) times daily. (Patient taking differently: Apply 2 g topically 4 (four) times daily as needed (pain).) 150 g 1  . esomeprazole (NEXIUM) 40 MG capsule TAKE 1 CAPSULE BY MOUTH EVERY DAY 90 capsule 3  . fluPHENAZine (PROLIXIN) 10 MG tablet Take 2 tablets (20 mg total) by mouth daily. 60 tablet 0  . fluticasone (FLONASE) 50 MCG/ACT nasal spray SPRAY 2 SPRAYS INTO EACH NOSTRIL EVERY DAY 48 mL 2  . gabapentin (NEURONTIN) 300 MG capsule TAKE 1 CAPSULE AT BEDTIME AS DIRECTED 90 capsule 3  . KLOR-CON M20 20 MEQ tablet Take 1 tablet (20 mEq total) by mouth daily. 90 tablet 2  . naproxen sodium (ALEVE) 220 MG tablet Take 440 mg by mouth daily as needed (pain).     [name] oxyCODONE-acetaminophen (PERCOCET/ROXICET) 5-325 MG tablet Take 1 tablet by mouth every 6 (six) hours as needed for pain.     . tamsulosin (FLOMAX) 0.4 MG CAPS capsule Take 0.4 mg by mouth daily.    . valsartan (DIOVAN) 160 MG tablet TAKE 1 TABLET BY MOUTH EVERY DAY 90 tablet 0  . albuterol (VENTOLIN HFA) 108 (90 Base) MCG/ACT inhaler TAKE 2 PUFFS BY MOUTH EVERY 6 HOURS AS NEEDED FOR WHEEZE OR SHORTNESS OF BREATH 8.5 each 6   No facility-administered medications prior to visit.    Review of Systems  Constitutional: Negative for chills, diaphoresis, fever, malaise/fatigue and weight loss.  HENT: Negative for congestion.   Respiratory: Negative for cough, hemoptysis, sputum production, shortness of breath and wheezing.   Cardiovascular: Negative for chest pain, palpitations and leg swelling.     Objective:   Vitals:   08/12/20 0851  BP: 134/86  Pulse: (!) 103  Temp: 97.7 F (36.5 C)  SpO2: 97%   Weight: 250 lb (113.4 kg)  Height: 5\' 9"  (1.753 m)   SpO2: 97 % O2 Device: None (Room air)  Physical Exam: General: Well-appearing, no acute distress HENT: [place], AT Eyes: EOMI, no scleral icterus Respiratory: Diminished breath sounds bilaterally.  No crackles, wheezing or rales Cardiovascular: RRR, -M/R/G, no JVD Extremities:-Edema,-tenderness Neuro: AAO x4, CNII-XII grossly intact Skin: Intact, no rashes or bruising Psych: Normal mood, normal affect, pressured speech, appropriate content  Data Reviewed:  Imaging: CXR 04/12/2018-no infiltrate, effusion or edema. CT Lung Screen 12/17/19 - Mild centrilobular emphysema. Right apical nodule increased from 2.8>5.35mm compared to 11/2018. CT Chest Lung screen 07/02/20 - Interval growth of right apical nodule to 6.9 mm from 5.2 mm  PFT: 07/11/20 FVC 3.52 (96%) FEV1 2.6 (92%) Ratio 69  TLC 85% DLCO 89% Interpretation: Mild obstructive defect.   Labs: CBC  Component Value Date/Time   WBC 13.7 (H) 10/16/2019 1422   RBC 5.07 10/16/2019 1422   HGB 14.8 10/16/2019 1422   HCT 45.5 10/16/2019 1422   PLT 327 10/16/2019 1422   MCV 89.7 10/16/2019 1422   MCH 29.2 10/16/2019 1422   MCHC 32.5 10/16/2019 1422   RDW 14.6 10/16/2019 1422   LYMPHSABS 1.8 09/15/2019 0958   MONOABS 0.9 09/15/2019 0958   EOSABS 0.1 09/15/2019 0958   BASOSABS 0.0 09/15/2019 0958   BMET    Component Value Date/Time   NA 141 10/16/2019 1422   K 4.1 10/16/2019 1422   CL 101 10/16/2019 1422   CO2 31 10/16/2019 1422   GLUCOSE 90 10/16/2019 1422   BUN 7 (L) 10/16/2019 1422   CREATININE 1.33 (H) 10/16/2019 1422   CALCIUM 9.4 10/16/2019 1422   GFRNONAA 57 (L) 10/16/2019 1422   GFRAA >60 10/16/2019 1422  Mild leukocytosis and AKI noted  Imaging, labs and test noted above have been reviewed independently by me.  Assessment & Plan:   Discussion: 65 year old male active smoker with emphysema, schizophrenia, depression and anxiety who presents for follow-up.  Enlarging right lung nodule however not a surgical candidate due to active tobacco use.  Right apical lung nodule - seen by CT surgery. Not a surgical candidate until he can quit smoking --CT Chest low dose has been ordered by CT surgery --Keep follow-up with CT surgery  Tobacco abuse Patient is an active smoker. Down to 1 ppd We discussed smoking cessation for >10 minutes. We discussed triggers and stressors and ways to deal with them. We discussed barriers to continued smoking and benefits of smoking cessation. Provided patient with information cessation techniques and interventions including [place] quitline.  COPD --CONTINUE Symbicort 160-4.5 TWO puffs TWICE a day --CONTINUE Albuterol AS NEEDED for shortness of breath or wheezing  Coronary artery atherosclerotic calcifications on CT --Refer to Cardiology for medical optimization. Patient states he may have "heart attack" with any procedure  Health Maintenance Immunization History  Administered Date(s) Administered  . Janssen (J&J) SARS-COV-2 Vaccination 06/29/2019  . Pneumococcal Conjugate-13 10/19/2017  . Td 04/19/1992  . Tdap 10/19/2017  Patient declines influenza vaccine  CT Lung Screen - ordered as above  Orders Placed This Encounter  Procedures  . Ambulatory referral to Cardiology    Referral Priority:   Routine    Referral Type:   Consultation    Referral Reason:   Specialty Services Required    Requested Specialty:   Cardiology    Number of Visits Requested:   1   Meds ordered this encounter  Medications  . albuterol (VENTOLIN HFA) 108 (90 Base) MCG/ACT inhaler    Sig: TAKE 2 PUFFS BY MOUTH EVERY 6 HOURS AS NEEDED FOR WHEEZE OR SHORTNESS OF BREATH    Dispense:  8.5 each    Refill:  6    Return in about 3 months (around 11/11/2020).   [place], MD [facility] 08/12/2020 9:40 AM  Office Number [phone]

## 2020-08-15 ENCOUNTER — Encounter: Payer: Self-pay | Admitting: Pulmonary Disease

## 2020-08-15 DIAGNOSIS — I2510 Atherosclerotic heart disease of native coronary artery without angina pectoris: Secondary | ICD-10-CM | POA: Insufficient documentation

## 2020-08-15 DIAGNOSIS — I2584 Coronary atherosclerosis due to calcified coronary lesion: Secondary | ICD-10-CM | POA: Insufficient documentation

## 2020-08-22 ENCOUNTER — Encounter: Payer: Self-pay | Admitting: Family

## 2020-08-22 ENCOUNTER — Ambulatory Visit (INDEPENDENT_AMBULATORY_CARE_PROVIDER_SITE_OTHER): Payer: Medicare Other | Admitting: Family

## 2020-08-22 ENCOUNTER — Other Ambulatory Visit: Payer: Self-pay

## 2020-08-22 VITALS — BP 138/84 | HR 84 | Temp 98.4°F | Ht 69.0 in | Wt 250.6 lb

## 2020-08-22 DIAGNOSIS — Z1211 Encounter for screening for malignant neoplasm of colon: Secondary | ICD-10-CM

## 2020-08-22 DIAGNOSIS — Z0000 Encounter for general adult medical examination without abnormal findings: Secondary | ICD-10-CM | POA: Diagnosis not present

## 2020-08-22 DIAGNOSIS — I10 Essential (primary) hypertension: Secondary | ICD-10-CM

## 2020-08-22 DIAGNOSIS — E782 Mixed hyperlipidemia: Secondary | ICD-10-CM

## 2020-08-22 MED ORDER — ESOMEPRAZOLE MAGNESIUM 40 MG PO CPDR: DELAYED_RELEASE_CAPSULE | ORAL | 3 refills | Status: DC

## 2020-08-22 MED ORDER — DICLOFENAC SODIUM 1 % EX GEL: 2.0000 g | Freq: Four times a day (QID) | CUTANEOUS | 1 refills | Status: DC | PRN

## 2020-08-22 MED ORDER — GABAPENTIN 300 MG PO CAPS: 300.0000 mg | ORAL_CAPSULE | Freq: Two times a day (BID) | ORAL | 3 refills | Status: DC

## 2020-08-22 MED ORDER — GABAPENTIN 300 MG PO CAPS: ORAL_CAPSULE | ORAL | 3 refills | Status: DC

## 2020-08-22 MED ORDER — AMLODIPINE BESYLATE 10 MG PO TABS: 1.0000 | ORAL_TABLET | Freq: Every day | ORAL | 3 refills | Status: DC

## 2020-08-22 MED ORDER — KLOR-CON M20 20 MEQ PO TBCR: 20.0000 meq | EXTENDED_RELEASE_TABLET | Freq: Every day | ORAL | 3 refills | Status: DC

## 2020-08-22 MED ORDER — VALSARTAN 160 MG PO TABS: 160.0000 mg | ORAL_TABLET | Freq: Every day | ORAL | 3 refills | Status: DC

## 2020-08-22 MED ORDER — BUDESONIDE-FORMOTEROL FUMARATE 160-4.5 MCG/ACT IN AERO: 2.0000 | INHALATION_SPRAY | Freq: Two times a day (BID) | RESPIRATORY_TRACT | 1 refills | Status: DC

## 2020-08-22 NOTE — Progress Notes (Signed)
[name] is a 65 y.o. male with the following history as recorded in EpicCare:  Patient Active Problem List   Diagnosis Date Noted  . Coronary artery calcification 08/15/2020  . Nodule of apex of right lung 05/29/2020  . Malignant neoplasm of prostate ([place]) 10/26/2019  . Chronic obstructive pulmonary disease ([place]) 10/24/2018  . Cervical radiculopathy at C8 01/18/2018  . Right lateral epicondylitis 12/21/2017  . Pain in joint, shoulder region 04/23/2015  . Periumbilical abdominal pain 02/13/2015  . Elevated PSA 08/04/2014  . Subacromial bursitis 04/11/2014  . Neck pain on right side 03/05/2014  . Bilateral shoulder pain 03/05/2014  . Recurrent boils 03/05/2014  . Foreign body in stomach 12/31/2013  . Reflux esophagitis 12/31/2013  . Weight loss 05/06/2012  . Incisional hernia 11/23/2011  . Cramp of limb 01/13/2011  . Hyperthyroidism 08/11/2010  . Sebaceous cyst 08/10/2010  . HYPERTROPHY PROSTATE W/UR OBST & OTH LUTS 02/27/2010  . Tobacco abuse 08/05/2009  . 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 HEMORRHOIDS, INTERNAL, WITH BLEEDING 04/26/2008  . Hyperlipidemia 12/27/2007  . LOW BACK PAIN, CHRONIC 11/08/2007  . Schizophrenia ([place]) 01/25/2007  . ANXIETY DISORDER, GENERALIZED 01/25/2007  . DEPRESSION 01/25/2007  . Essential hypertension 01/25/2007  . ALLERGIC RHINITIS 01/25/2007  . SCOLIOSIS NEC 01/25/2007  . Impaired glucose tolerance 01/25/2007    Current Outpatient Medications  Medication Sig Dispense Refill  . acetaminophen (TYLENOL) 500 MG tablet Take 1,000 mg by mouth every 6 (six) hours as needed for moderate pain.     [name] albuterol (PROVENTIL) (2.5 MG/3ML) 0.083% nebulizer solution INHALE 3 ML BY NEBULIZATION EVERY 6 HOURS AS NEEDED FOR WHEEZING OR SHORTNESS OF BREATH 150 mL 1  . albuterol (VENTOLIN HFA) 108 (90 Base) MCG/ACT inhaler TAKE 2 PUFFS BY MOUTH EVERY 6 HOURS AS NEEDED FOR WHEEZE OR SHORTNESS OF BREATH 8.5 each 6  . benztropine (COGENTIN) 0.5 MG tablet  Take 0.5 mg by mouth 2 (two) times daily.     . Blood Pressure Monitoring (BLOOD PRESSURE CUFF) MISC Use daily as directed to check blood pressure 1 each 0  . cyclobenzaprine (FLEXERIL) 10 MG tablet TAKE 1 TABLET BY MOUTH EVERYDAY AT BEDTIME 30 tablet 3  . fluPHENAZine (PROLIXIN) 10 MG tablet Take 2 tablets (20 mg total) by mouth daily. 60 tablet 0  . fluticasone (FLONASE) 50 MCG/ACT nasal spray SPRAY 2 SPRAYS INTO EACH NOSTRIL EVERY DAY 48 mL 2  . naproxen sodium (ALEVE) 220 MG tablet Take 440 mg by mouth daily as needed (pain).     . tamsulosin (FLOMAX) 0.4 MG CAPS capsule Take 0.4 mg by mouth daily.    [name] amLODipine (NORVASC) 10 MG tablet Take 1 tablet (10 mg total) by mouth daily. 90 tablet 3  . budesonide-formoterol (SYMBICORT) 160-4.5 MCG/ACT inhaler Inhale 2 puffs into the lungs in the morning and at bedtime. TAKE 2 PUFFS BY MOUTH TWICE A DAY 30.6 g 1  . diclofenac Sodium (VOLTAREN) 1 % GEL Apply 2 g topically 4 (four) times daily as needed (pain). 150 g 1  . esomeprazole (NEXIUM) 40 MG capsule TAKE 1 CAPSULE BY MOUTH EVERY DAY 90 capsule 3  . gabapentin (NEURONTIN) 300 MG capsule Take 1 capsule (300 mg total) by mouth 2 (two) times daily. 180 capsule 3  . KLOR-CON M20 20 MEQ tablet Take 1 tablet (20 mEq total) by mouth daily. 90 tablet 3  . oxyCODONE-acetaminophen (PERCOCET/ROXICET) 5-325 MG tablet Take 1 tablet by mouth every 6 (six) hours as needed for pain.  (Patient  not taking: Reported on 08/22/2020)    . valsartan (DIOVAN) 160 MG tablet Take 1 tablet (160 mg total) by mouth daily. 90 tablet 3   No current facility-administered medications for this visit.    Allergies: Celecoxib, Iohexol, Ivp dye [iodinated diagnostic agents], Metoclopramide hcl, Ritalin [methylphenidate], and Wellbutrin [bupropion]  Past Medical History:  Diagnosis Date  . ALLERGIC RHINITIS 01/25/2007  . ANXIETY DISORDER, GENERALIZED 01/25/2007  . Arthritis   . Chronic kidney disease    stage 3  . DEPRESSION  01/25/2007  . ESOPHAGITIS 12/28/2007  . Fatty liver 10/09/2010  . GERD 01/25/2007  . GLUCOSE INTOLERANCE, HX OF 01/25/2007  . Heart murmur    hx of   . HEMORRHOIDS, INTERNAL, WITH BLEEDING 04/26/2008  . Hiatal hernia   . HYPERLIPIDEMIA 12/27/2007  . HYPERTENSION, ESSENTIAL NOS 01/25/2007  . Hyperthyroidism 08/11/2010  . HYPERTROPHY PROSTATE W/UR OBST & OTH LUTS 02/27/2010  . 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 LOW BACK PAIN, CHRONIC 11/08/2007  . Pancreatitis   . Prostate cancer ([facility])   . Rectal abscess   . 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 SCOLIOSIS NEC 01/25/2007  . Sebaceous cyst 08/10/2010  . SMOKER 08/05/2009  . UPPER GASTROINTESTINAL HEMORRHAGE 01/25/2007    Past Surgical History:  Procedure Laterality Date  . ABDOMINAL EXPLORATION SURGERY  1988  . CHOLECYSTECTOMY  05/20/08   Dr. Abbey Chatters  . COLONOSCOPY  04/23/2010   normal  . ESOPHAGOGASTRODUODENOSCOPY N/A 12/31/2013   Procedure: ESOPHAGOGASTRODUODENOSCOPY (EGD);  Surgeon: Iva Boop, MD;  Location: [facility];  Service: Endoscopy;  Laterality: N/A;  . Excision of scalp lesion  07/2009  . Excision of thyroid mass    . GANGLION CYST EXCISION     right foot x 2  . HERNIA REPAIR     ventral  . INCISIONAL HERNIA REPAIR  12/01/2011   Procedure: LAPAROSCOPIC INCISIONAL HERNIA;  Surgeon: Shelly Rubenstein, MD;  Location: [facility];  Service: General;  Laterality: N/A;  . SHOULDER SURGERY     right    Family History  Problem Relation Age of Onset  . Hypertension Father   . Kidney disease Father   . Hypertension Mother   . Bone cancer Mother   . Prostate cancer Other   . Kidney disease Brother   . Colon cancer Neg Hx     Social History   Tobacco Use  . Smoking status: Current Every Day Smoker    Packs/day: 1.50    Years: 49.00    Pack years: 73.50    Types: Cigarettes    Start date: 28  . Smokeless tobacco: Never Used  . Tobacco comment: still smoking packp er day 08/12/2020  Substance Use Topics  . Alcohol  use: No    Subjective:  Presents for yearly CPE today; accompanied by his wife today; knows that he needs to quit smoking; Under care of urology for monitoring prostate cancer;  Recent lung cancer CT did show questionable area- too small to biopsy but planning for re-check in 3 months;  Referred to cardiology for evaluation of coronary artery calcification; has been on Crestor in the past but unclear why he stopped taking;  Saw nephrologist recently- labs done there showed stable kidney function;  Sees psychiatrist regularly;   Review of Systems  Constitutional: Negative.   HENT: Negative.   Eyes: Negative.   Respiratory: Negative.   Cardiovascular: Negative.   Gastrointestinal: Negative.   Genitourinary: Negative.   Musculoskeletal: Positive for joint pain and neck pain.  Skin: Negative.   Neurological: Negative.   Endo/Heme/Allergies: Negative.   Psychiatric/Behavioral: Negative.       Objective:  Vitals:   08/22/20 1434  BP: 138/84  Pulse: 84  Temp: 98.4 F (36.9 C)  TempSrc: Oral  SpO2: 98%  Weight: 250 lb 9.6 oz (113.7 kg)  Height: $Remove'5\' 9"'oofeUtr$  (1.753 m)    General: Well developed, well nourished, in no acute distress  Skin : Warm and dry.  Head: Normocephalic and atraumatic  Eyes: Sclera and conjunctiva clear; pupils round and reactive to light; extraocular movements intact  Ears: External normal; canals clear; tympanic membranes normal  Oropharynx: Pink, supple. No suspicious lesions  Neck: Supple without thyromegaly, adenopathy  Lungs: Respirations unlabored; clear to auscultation bilaterally without wheeze, rales, rhonchi  CVS exam: normal rate and regular rhythm.  Abdomen: Soft; nontender; nondistended; normoactive bowel sounds; no masses or hepatosplenomegaly  Musculoskeletal: No deformities; no active joint inflammation  Extremities: No edema, cyanosis, clubbing  Vessels: Symmetric bilaterally  Neurologic: Alert and oriented; speech intact; face symmetrical;  moves all extremities well; CNII-XII intact without focal deficit   Assessment:  1. PE (physical exam), annual   2. Mixed hyperlipidemia   3. Essential hypertension   4. Encounter for screening colonoscopy     Plan:  Refills updated; can try increasing Gabapentin to 300 mg bid; will plan to re-start statin- not sure why Crestor was discontinued;  Stressed the absolute necessity of quitting smoking; Continue with specialists as scheduled;  Referral for colonoscopy updated;   Plan to follow up in 3-4 months;  This visit occurred during the SARS-CoV-2 public health emergency.  Safety protocols were in place, including screening questions prior to the visit, additional usage of staff PPE, and extensive cleaning of exam room while observing appropriate contact time as indicated for disinfecting solutions.      No follow-ups on file.  Orders Placed This Encounter  Procedures  . CBC with Differential/Platelet  . Comp Met (CMET)  . Lipid panel  . Ambulatory referral to Gastroenterology    Referral Priority:   Routine    Referral Type:   Consultation    Referral Reason:   Specialty Services Required    Number of Visits Requested:   1    Requested Prescriptions   Signed Prescriptions Disp Refills  . amLODipine (NORVASC) 10 MG tablet 90 tablet 3    Sig: Take 1 tablet (10 mg total) by mouth daily.  . budesonide-formoterol (SYMBICORT) 160-4.5 MCG/ACT inhaler 30.6 g 1    Sig: Inhale 2 puffs into the lungs in the morning and at bedtime. TAKE 2 PUFFS BY MOUTH TWICE A DAY  . esomeprazole (NEXIUM) 40 MG capsule 90 capsule 3    Sig: TAKE 1 CAPSULE BY MOUTH EVERY DAY  . KLOR-CON M20 20 MEQ tablet 90 tablet 3    Sig: Take 1 tablet (20 mEq total) by mouth daily.  . valsartan (DIOVAN) 160 MG tablet 90 tablet 3    Sig: Take 1 tablet (160 mg total) by mouth daily.  . diclofenac Sodium (VOLTAREN) 1 % GEL 150 g 1    Sig: Apply 2 g topically 4 (four) times daily as needed (pain).  [name] gabapentin  (NEURONTIN) 300 MG capsule 180 capsule 3    Sig: Take 1 capsule (300 mg total) by mouth 2 (two) times daily.

## 2020-09-02 ENCOUNTER — Other Ambulatory Visit: Payer: Self-pay | Admitting: Thoracic Surgery (Cardiothoracic Vascular Surgery)

## 2020-09-02 DIAGNOSIS — R911 Solitary pulmonary nodule: Secondary | ICD-10-CM

## 2020-09-09 ENCOUNTER — Other Ambulatory Visit: Payer: Self-pay

## 2020-09-09 DIAGNOSIS — I6522 Occlusion and stenosis of left carotid artery: Secondary | ICD-10-CM

## 2020-09-16 ENCOUNTER — Other Ambulatory Visit: Payer: Self-pay

## 2020-09-16 ENCOUNTER — Ambulatory Visit (INDEPENDENT_AMBULATORY_CARE_PROVIDER_SITE_OTHER): Payer: Medicare Other | Admitting: Podiatry

## 2020-09-16 ENCOUNTER — Encounter: Payer: Self-pay | Admitting: Podiatry

## 2020-09-16 DIAGNOSIS — M79676 Pain in unspecified toe(s): Secondary | ICD-10-CM

## 2020-09-16 DIAGNOSIS — B351 Tinea unguium: Secondary | ICD-10-CM | POA: Diagnosis not present

## 2020-09-16 NOTE — Progress Notes (Signed)
ROS  Subjective:  Patient ID: [name], male    DOB: [birth year],  MRN: [id number] HPI Chief Complaint  Patient presents with  . Debridement    Trim toenails-hallux bilateral tender  . New Patient (Initial Visit)    65 y.o. male presents with the above complaint.   : Denies fever chills nausea vomiting muscle aches pains calf pain back pain chest pain shortness of breath.  Past Medical History:  Diagnosis Date  . ALLERGIC RHINITIS 01/25/2007  . ANXIETY DISORDER, GENERALIZED 01/25/2007  . Arthritis   . Chronic kidney disease    stage 3  . DEPRESSION 01/25/2007  . ESOPHAGITIS 12/28/2007  . Fatty liver 10/09/2010  . GERD 01/25/2007  . GLUCOSE INTOLERANCE, HX OF 01/25/2007  . Heart murmur    hx of   . HEMORRHOIDS, INTERNAL, WITH BLEEDING 04/26/2008  . Hiatal hernia   . HYPERLIPIDEMIA 12/27/2007  . HYPERTENSION, ESSENTIAL NOS 01/25/2007  . Hyperthyroidism 08/11/2010  . HYPERTROPHY PROSTATE W/UR OBST & OTH LUTS 02/27/2010  . 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 LOW BACK PAIN, CHRONIC 11/08/2007  . Pancreatitis   . Prostate cancer ([place])   . Rectal abscess   . 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 SCOLIOSIS NEC 01/25/2007  . Sebaceous cyst 08/10/2010  . SMOKER 08/05/2009  . UPPER GASTROINTESTINAL HEMORRHAGE 01/25/2007   Past Surgical History:  Procedure Laterality Date  . ABDOMINAL EXPLORATION SURGERY  1988  . CHOLECYSTECTOMY  05/20/08   Dr. Zella Richer  . COLONOSCOPY  04/23/2010   normal  . ESOPHAGOGASTRODUODENOSCOPY N/A 12/31/2013   Procedure: ESOPHAGOGASTRODUODENOSCOPY (EGD);  Surgeon: Gatha Mayer, MD;  Location: [facility];  Service: Endoscopy;  Laterality: N/A;  . Excision of scalp lesion  07/2009  . Excision of thyroid mass    . GANGLION CYST EXCISION     right foot x 2  . HERNIA REPAIR     ventral  . INCISIONAL HERNIA REPAIR  12/01/2011   Procedure: LAPAROSCOPIC INCISIONAL HERNIA;  Surgeon: Harl Bowie, MD;  Location: [facility];  Service: General;  Laterality:  N/A;  . SHOULDER SURGERY     right    Current Outpatient Medications:  .  acetaminophen (TYLENOL) 500 MG tablet, Take 1,000 mg by mouth every 6 (six) hours as needed for moderate pain. , Disp: , Rfl:  .  albuterol (PROVENTIL) (2.5 MG/3ML) 0.083% nebulizer solution, INHALE 3 ML BY NEBULIZATION EVERY 6 HOURS AS NEEDED FOR WHEEZING OR SHORTNESS OF BREATH, Disp: 150 mL, Rfl: 1 .  albuterol (VENTOLIN HFA) 108 (90 Base) MCG/ACT inhaler, TAKE 2 PUFFS BY MOUTH EVERY 6 HOURS AS NEEDED FOR WHEEZE OR SHORTNESS OF BREATH, Disp: 8.5 each, Rfl: 6 .  amLODipine (NORVASC) 10 MG tablet, Take 1 tablet (10 mg total) by mouth daily., Disp: 90 tablet, Rfl: 3 .  benztropine (COGENTIN) 0.5 MG tablet, Take 0.5 mg by mouth 2 (two) times daily. , Disp: , Rfl:  .  Blood Pressure Monitoring (BLOOD PRESSURE CUFF) MISC, Use daily as directed to check blood pressure, Disp: 1 each, Rfl: 0 .  budesonide-formoterol (SYMBICORT) 160-4.5 MCG/ACT inhaler, Inhale 2 puffs into the lungs in the morning and at bedtime. TAKE 2 PUFFS BY MOUTH TWICE A DAY, Disp: 30.6 g, Rfl: 1 .  cyclobenzaprine (FLEXERIL) 10 MG tablet, TAKE 1 TABLET BY MOUTH EVERYDAY AT BEDTIME, Disp: 30 tablet, Rfl: 3 .  diclofenac Sodium (VOLTAREN) 1 % GEL, Apply 2 g topically 4 (four) times daily as needed (pain)., Disp: 150 g, Rfl: 1 .  esomeprazole (NEXIUM) 40 MG capsule, TAKE 1 CAPSULE BY MOUTH EVERY DAY, Disp: 90 capsule, Rfl: 3 .  fluPHENAZine (PROLIXIN) 10 MG tablet, Take 2 tablets (20 mg total) by mouth daily., Disp: 60 tablet, Rfl: 0 .  fluticasone (FLONASE) 50 MCG/ACT nasal spray, SPRAY 2 SPRAYS INTO EACH NOSTRIL EVERY DAY, Disp: 48 mL, Rfl: 2 .  gabapentin (NEURONTIN) 300 MG capsule, Take 1 capsule (300 mg total) by mouth 2 (two) times daily., Disp: 180 capsule, Rfl: 3 .  KLOR-CON M20 20 MEQ tablet, Take 1 tablet (20 mEq total) by mouth daily., Disp: 90 tablet, Rfl: 3 .  losartan (COZAAR) 25 MG tablet, , Disp: , Rfl:  .  naproxen sodium (ALEVE) 220 MG tablet,  Take 440 mg by mouth daily as needed (pain). , Disp: , Rfl:  .  oxyCODONE-acetaminophen (PERCOCET/ROXICET) 5-325 MG tablet, Take 1 tablet by mouth every 6 (six) hours as needed for pain.  (Patient not taking: Reported on 08/22/2020), Disp: , Rfl:  .  rosuvastatin (CRESTOR) 5 MG tablet, , Disp: , Rfl:  .  tamsulosin (FLOMAX) 0.4 MG CAPS capsule, Take 0.4 mg by mouth daily., Disp: , Rfl:  .  valsartan (DIOVAN) 160 MG tablet, Take 1 tablet (160 mg total) by mouth daily., Disp: 90 tablet, Rfl: 3  Allergies  Allergen Reactions  . Celecoxib Other (See Comments)     rectal bleeding  . Iohexol Other (See Comments)    Affects nerves: triggers schizophrenia  . Ivp Dye [Iodinated Diagnostic Agents]   . Metoclopramide Hcl Other (See Comments)    Affects nerves: triggers schizophrenia  . Ritalin [Methylphenidate]   . Wellbutrin [Bupropion] Other (See Comments)    Makes pt smoke heavily   Review of Systems Objective:  There were no vitals filed for this visit.  General: Well developed, nourished, in no acute distress, alert and oriented x3   Dermatological: Skin is warm, dry and supple bilateral. Nails x 10 are thick yellow dystrophic clinically mycotic.; remaining integument appears to demonstrate dry xerotic skin at this time. There are no open sores, no preulcerative lesions, no rash or signs of infection present.  Vascular: Dorsalis Pedis artery and Posterior Tibial artery pedal pulses are 2/4 bilateral with immedate capillary fill time. Pedal hair growth present. No varicosities and no lower extremity edema present bilateral.   Neruologic: Grossly intact via light touch bilateral. Vibratory intact via tuning fork bilateral. Protective threshold with Semmes Wienstein monofilament intact to all pedal sites bilateral. Patellar and Achilles deep tendon reflexes 2+ bilateral. No Babinski or clonus noted bilateral.   Musculoskeletal: No gross boney pedal deformities bilateral. No pain, crepitus, or  limitation noted with foot and ankle range of motion bilateral. Muscular strength 5/5 in all groups tested bilateral.  Gait: Unassisted, Nonantalgic.    Radiographs:  None taken  Assessment & Plan:   Assessment: Pain in limb secondary to onychomycosis.  Plan: Discussed appropriate hydration therapy.  Debrided toenails 1 through 5 bilateral and he will follow-up with Dr. Sharyon Cable or Dr. Adah Perl for routine nail care.     [name] T. [place], [place]

## 2020-10-10 ENCOUNTER — Other Ambulatory Visit: Payer: Medicare Other

## 2020-10-10 ENCOUNTER — Ambulatory Visit: Payer: Medicare Other | Admitting: Thoracic Surgery (Cardiothoracic Vascular Surgery)

## 2020-10-16 DIAGNOSIS — H524 Presbyopia: Secondary | ICD-10-CM | POA: Diagnosis not present

## 2020-10-16 DIAGNOSIS — H04123 Dry eye syndrome of bilateral lacrimal glands: Secondary | ICD-10-CM | POA: Diagnosis not present

## 2020-10-16 DIAGNOSIS — H2513 Age-related nuclear cataract, bilateral: Secondary | ICD-10-CM | POA: Diagnosis not present

## 2020-10-17 ENCOUNTER — Ambulatory Visit (INDEPENDENT_AMBULATORY_CARE_PROVIDER_SITE_OTHER): Payer: Medicare Other | Admitting: Thoracic Surgery (Cardiothoracic Vascular Surgery)

## 2020-10-17 ENCOUNTER — Other Ambulatory Visit: Payer: Self-pay

## 2020-10-17 ENCOUNTER — Ambulatory Visit
Admission: RE | Admit: 2020-10-17 | Discharge: 2020-10-17 | Disposition: A | Discharge status: Home / Self Care | Payer: Medicare Other | Source: Ambulatory Visit | Attending: Thoracic Surgery (Cardiothoracic Vascular Surgery) | Admitting: Thoracic Surgery (Cardiothoracic Vascular Surgery)

## 2020-10-17 VITALS — BP 125/85 | HR 86 | Resp 20 | Ht 69.0 in | Wt 250.0 lb

## 2020-10-17 DIAGNOSIS — J439 Emphysema, unspecified: Secondary | ICD-10-CM | POA: Diagnosis not present

## 2020-10-17 DIAGNOSIS — R911 Solitary pulmonary nodule: Secondary | ICD-10-CM

## 2020-10-17 DIAGNOSIS — I700 Atherosclerosis of aorta: Secondary | ICD-10-CM | POA: Diagnosis not present

## 2020-10-17 NOTE — Progress Notes (Signed)
      [address] CitySuite [address]       [place], [address]             [phone]        [facility] Medical Record #[id number] Date of Birth: 13-Jun-1955  Referring: Garner Nash, DO Primary Care: Marrian Salvage, FNP Primary Cardiologist:None  Reason for visit:   follow-up  History of Present Illness:     65 year old male with a 6 mm right upper lobe pulmonary nodule presents for his 4-month follow-up CT scan.  He continues to smoke, and is unable to stop.  He also has a history of schizophrenia and says that he requires aggressive help with his symptoms.  Physical Exam: BP 125/85   Pulse 86   Resp 20   Ht 5\' 9"  (1.753 m)   Wt 250 lb (113.4 kg)   SpO2 95% Comment: RA  BMI 36.92 kg/m   Alert NAD Regular rate Easy work of breathing     Diagnostic Studies & Laboratory data: IMPRESSION: 1. Unchanged 6 mm subpleural nodule of the posterior right apex. This remains nonspecific, indolent adenocarcinoma not strictly excluded given history of growth, however interval stability reassuring. Recommend return to annual low-dose CT lung cancer screening. 2. Background of diffuse, fine centrilobular nodularity throughout the lungs, consistent with smoking-related respiratory bronchiolitis. 3. Diffuse bilateral bronchial wall thickening, consistent with nonspecific infectious or inflammatory bronchitis. 4. Emphysema. 5. Coronary artery disease.    Assessment / Plan:   65 year old male with 6 mm right upper lobe pulmonary nodule that has been stable at 3 months.  I will refer him to Dr. Valeta Harms for further evaluation as to whether or not this could be biopsied with a new Ion system.  The other option would just be to continue surveillance given his smoking history.   Lajuana Matte 10/17/2020 1:28 PM

## 2020-10-21 NOTE — Progress Notes (Signed)
Cardiology Office Note:    Date:  10/22/2020   ID:  [name], DOB 1955/10/28, MRN [id number]  PCP:  Marrian Salvage, [place]  Cardiologist:  None   Referring MD: Margaretha Seeds, MD   Chief Complaint  Patient presents with   Coronary Artery Disease   Hypertension   Hyperlipidemia     History of Present Illness:    [name] is a 65 y.o. male with a hx of referred due to coronary calcification, tobacco use, primary hypertension, schizophrenia, esophageal reflux, and allergic rhinitis.  Coronary artery calcification noted on a noncoronary CT for lung cancer surveillance.   Denies chest discomfort compatible with angina.  Has chronic shortness of breath likely related to longstanding tobacco abuse since age 25.  Sedentary due to significant lumbar and cervical disc disease.  Does not do physical activity for health purposes.  His legs and back hurts severely if he is up and moving about continuously.  He denies claudication.  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 Zella Richer   COLONOSCOPY  04/23/2010   normal   ESOPHAGOGASTRODUODENOSCOPY N/A 12/31/2013   Procedure: ESOPHAGOGASTRODUODENOSCOPY (EGD);  Surgeon: Gatha Mayer, MD;  Location: [facility];  Service: Endoscopy;  Laterality: N/A;   Excision of scalp lesion  07/2009   Excision of thyroid mass     GANGLION CYST EXCISION     right foot x 2   HERNIA REPAIR     ventral   INCISIONAL HERNIA REPAIR  12/01/2011   Procedure: LAPAROSCOPIC INCISIONAL HERNIA;  Surgeon: Harl Bowie, MD;  Location: [facility];  Service: General;  Laterality: N/A;   SHOULDER SURGERY     right    Current Medications: Current Meds  Medication Sig   acetaminophen (TYLENOL) 500 MG tablet Take 1,000 mg by mouth every 6 (six) hours as needed for moderate pain.    albuterol (PROVENTIL) (2.5 MG/3ML) 0.083% nebulizer solution INHALE 3 ML BY NEBULIZATION EVERY 6 HOURS AS NEEDED FOR WHEEZING OR SHORTNESS OF BREATH   albuterol (VENTOLIN HFA) 108 (90 Base) MCG/ACT inhaler TAKE 2 PUFFS BY MOUTH EVERY 6 HOURS AS NEEDED FOR WHEEZE OR SHORTNESS OF BREATH   amLODipine (NORVASC) 10 MG tablet Take 1 tablet (10 mg total) by mouth daily.   benztropine (COGENTIN) 0.5 MG tablet Take 0.5 mg by mouth 2 (two) times daily.    Blood Pressure Monitoring (BLOOD PRESSURE CUFF) MISC Use daily as directed to check blood pressure   budesonide-formoterol (SYMBICORT) 160-4.5  MCG/ACT inhaler Inhale 2 puffs into the lungs in the morning and at bedtime. TAKE 2 PUFFS BY MOUTH TWICE A DAY   cyclobenzaprine (FLEXERIL) 10 MG tablet TAKE 1 TABLET BY MOUTH EVERYDAY AT BEDTIME   diclofenac Sodium (VOLTAREN) 1 % GEL Apply 2 g topically 4 (four) times daily as needed (pain).   esomeprazole (NEXIUM) 40 MG capsule TAKE 1 CAPSULE BY MOUTH EVERY DAY   fluPHENAZine (PROLIXIN) 10 MG tablet Take 2 tablets (20 mg total) by mouth daily.   fluticasone (FLONASE) 50 MCG/ACT nasal spray SPRAY 2 SPRAYS INTO EACH NOSTRIL EVERY DAY   gabapentin (NEURONTIN) 300 MG capsule Take  1 capsule (300 mg total) by mouth 2 (two) times daily.   KLOR-CON M20 20 MEQ tablet Take 1 tablet (20 mEq total) by mouth daily.   losartan (COZAAR) 25 MG tablet    naproxen sodium (ALEVE) 220 MG tablet Take 440 mg by mouth daily as needed (pain).    tamsulosin (FLOMAX) 0.4 MG CAPS capsule Take 0.4 mg by mouth daily.   valsartan (DIOVAN) 160 MG tablet Take 1 tablet (160 mg total) by mouth daily.     Allergies:   Celecoxib, Iohexol, Ivp dye [iodinated diagnostic agents], Metoclopramide hcl, Ritalin [methylphenidate], and Wellbutrin [bupropion]   Social History   Socioeconomic History   Marital status: Married    Spouse name: Wynn Maudlin   Number of children: 2   Years of education: Not on file   Highest education level: Not on file  Occupational History   Occupation: Disabled    Employer: DISABLED  Tobacco Use   Smoking status: Every Day    Packs/day: 1.50    Years: 49.00    Pack years: 73.50    Types: Cigarettes    Start date: 1971   Smokeless tobacco: Never   Tobacco comments:    still smoking packp er day 08/12/2020  Vaping Use   Vaping Use: Never used  Substance and Sexual Activity   Alcohol use: No   Drug use: Yes    Frequency: 14.0 times per week    Types: Marijuana   Sexual activity: Yes    Partners: Female  Other Topics Concern   Not on file  Social History Narrative   HSG disabled due to schizophrenia - 1983. stable long-term marriage with children. cared for his father-in-law- passed away in 06-29-2022   Social Determinants of Health   Financial Resource Strain: Not on file  Food Insecurity: Not on file  Transportation Needs: Not on file  Physical Activity: Not on file  Stress: Not on file  Social Connections: Not on file     Family History: The patient's family history includes Bone cancer in his mother; Hypertension in his father and mother; Kidney disease in his brother and father; Prostate cancer in an other family member. There is no history of Colon  cancer.  ROS:   Please see the history of present illness.    Schizophrenic.  Bouts of anxiety.  Smokes cigarettes to decrease stress.  Chronic back and neck discomfort.  Abdominal discomfort due to a recurring ventral hernia.  States he has had 27 operations over the years for various musculoskeletal noncardiac issues.  All other systems reviewed and are negative.  EKGs/Labs/Other Studies Reviewed:    The following studies were reviewed today:  CHEST CT 10/2020: IMPRESSION: 1. Unchanged 6 mm subpleural nodule of the posterior right apex. This remains nonspecific, indolent adenocarcinoma not strictly excluded given history of growth, however interval stability reassuring. Recommend  return to annual low-dose CT lung cancer screening. 2. Background of diffuse, fine centrilobular nodularity throughout the lungs, consistent with smoking-related respiratory bronchiolitis. 3. Diffuse bilateral bronchial wall thickening, consistent with nonspecific infectious or inflammatory bronchitis. 4. Emphysema. 5. Coronary artery disease - 3 vessel calcification.   Aortic Atherosclerosis (ICD10-I70.0) and Emphysema (ICD10-J43.9).    CAROTID ATHEROSCLEROSIS 12/10/2019: Summary:  Right Carotid: Velocities in the right ICA are consistent with a 1-39%  stenosis.   Left Carotid: Velocities in the left ICA are consistent with a 40-59%  stenosis.                Non-hemodynamically significant plaque <50% noted in the  CCA. The                ECA appears >50% stenosed.   Vertebrals:  Bilateral vertebral arteries demonstrate antegrade flow.  Subclavians: Normal flow hemodynamics were seen in bilateral subclavian               arteries.   EKG:  EKG sinus rhythm with normal appearance.  Recent Labs: No results found for requested labs within last 8760 hours.  Recent Lipid Panel    Component Value Date/Time   CHOL 170 10/24/2018 0942   TRIG 148.0 10/24/2018 0942   HDL 41.30 10/24/2018 0942    CHOLHDL 4 10/24/2018 0942   VLDL 29.6 10/24/2018 0942   LDLCALC 99 10/24/2018 0942   LDLDIRECT 168.4 03/05/2014 0952    Physical Exam:    VS:  BP 138/82   Pulse 94   Ht 5\' 9"  (1.753 m)   Wt 254 lb 3.2 oz (115.3 kg)   SpO2 97%   BMI 37.54 kg/m     Wt Readings from Last 3 Encounters:  10/22/20 254 lb 3.2 oz (115.3 kg)  10/17/20 250 lb (113.4 kg)  08/22/20 250 lb 9.6 oz (113.7 kg)     GEN: Moderate obesity. No acute distress HEENT: Normal NECK: No JVD. LYMPHATICS: No lymphadenopathy CARDIAC: No murmur. RRR no gallop, or edema. VASCULAR:  Normal Pulses. No bruits. RESPIRATORY:  Clear to auscultation without rales, wheezing or rhonchi  ABDOMEN: Soft, non-tender, non-distended, No pulsatile mass, MUSCULOSKELETAL: No deformity  SKIN: Warm and dry NEUROLOGIC:  Alert and oriented x 3 PSYCHIATRIC:  Normal affect   ASSESSMENT:    1. Coronary artery calcification   2. Mixed hyperlipidemia   3. Essential hypertension   4. Impaired glucose tolerance   5. Tobacco abuse    PLAN:    In order of problems listed above:  No obvious ischemic symptoms but he is sedentary.  Significant risk profile including heavy tobacco use, elevated lipids, history of hypertension, obesity, and family history of vascular disease.  Myocardial perfusion imaging with Lexiscan stress will be done to exclude high risk subset.  Emphasized that the main management strategy is primary prevention with control of risk factors as outlined below.  Needs to seriously consider smoking cessation (we will not be able to do this). Statin therapy for LDL less than 70 Therapy for target blood pressure less than 130/80 mmHg. Follow hemoglobin A1c with target less than 7.  I do not see one readily available in his records. Smoking cessation recommended but highly unlikely as he has a significant psychological dependence.     Overall education and awareness concerning primary risk prevention was discussed in detail:  LDL less than 70, hemoglobin A1c less than 7, blood pressure target less than 130/80 mmHg, >150 minutes of moderate aerobic activity per week, avoidance  of smoking, weight control (via diet and exercise), and continued surveillance/management of/for obstructive sleep apnea.    Medication Adjustments/Labs and Tests Ordered: Current medicines are reviewed at length with the patient today.  Concerns regarding medicines are outlined above.  Orders Placed This Encounter  Procedures   MYOCARDIAL PERFUSION IMAGING   EKG 12-Lead    No orders of the defined types were placed in this encounter.   Patient Instructions  Medication Instructions: Your physician recommends that you continue on your current medications as directed. Please refer to the Current Medication list given to you today.  *If you need a refill on your cardiac medications before your next appointment, please call your pharmacy*  Lab Work: If you have labs (blood work) drawn today and your tests are completely normal, you will receive your results only by: [place] (if you have MyChart) OR A paper copy in the mail If you have any lab test that is abnormal or we need to change your treatment, we will call you to review the results.   Testing/Procedures: Your physician has requested that you have a lexiscan myoview. For further information please visit [email or web address]. Please follow instruction sheet, as given.  Follow-Up: At [facility], you and your health needs are our priority.  As part of our continuing mission to provide you with exceptional heart care, we have created designated Provider Care Teams.  These Care Teams include your primary Cardiologist (physician) and Advanced Practice Providers (APPs -  Physician Assistants and Nurse Practitioners) who all work together to provide you with the care you need, when you need it.  We recommend signing up for the patient portal called "MyChart".  Sign up  information is provided on this After Visit Summary.  MyChart is used to connect with patients for Virtual Visits (Telemedicine).  Patients are able to view lab/test results, encounter notes, upcoming appointments, etc.  Non-urgent messages can be sent to your provider as well.   To learn more about what you can do with MyChart, go to [email or web address].    Your next appointment:   As needed  The format for your next appointment:   In Person  Provider:   You may see Dr. Tamala Julian or one of the following Advanced Practice Providers on your designated Care Team:   Kathyrn Drown, NP   Signed, Sinclair Grooms, MD  10/22/2020 11:23 AM    [place]

## 2020-10-22 ENCOUNTER — Ambulatory Visit (INDEPENDENT_AMBULATORY_CARE_PROVIDER_SITE_OTHER): Payer: Medicare Other | Admitting: Interventional Cardiology

## 2020-10-22 ENCOUNTER — Encounter: Payer: Self-pay | Admitting: Interventional Cardiology

## 2020-10-22 ENCOUNTER — Other Ambulatory Visit: Payer: Self-pay

## 2020-10-22 VITALS — BP 138/82 | HR 94 | Ht 69.0 in | Wt 254.2 lb

## 2020-10-22 DIAGNOSIS — R7302 Impaired glucose tolerance (oral): Secondary | ICD-10-CM

## 2020-10-22 DIAGNOSIS — I10 Essential (primary) hypertension: Secondary | ICD-10-CM

## 2020-10-22 DIAGNOSIS — I2510 Atherosclerotic heart disease of native coronary artery without angina pectoris: Secondary | ICD-10-CM | POA: Diagnosis not present

## 2020-10-22 DIAGNOSIS — I2584 Coronary atherosclerosis due to calcified coronary lesion: Secondary | ICD-10-CM | POA: Diagnosis not present

## 2020-10-22 DIAGNOSIS — Z720 Tobacco use: Secondary | ICD-10-CM | POA: Diagnosis not present

## 2020-10-22 DIAGNOSIS — E782 Mixed hyperlipidemia: Secondary | ICD-10-CM

## 2020-10-22 NOTE — Addendum Note (Signed)
Addended by: Aris Georgia, Merlinda Wrubel L on: 10/22/2020 01:34 PM   Modules accepted: Orders

## 2020-10-22 NOTE — Patient Instructions (Signed)
Medication Instructions: Your physician recommends that you continue on your current medications as directed. Please refer to the Current Medication list given to you today.  *If you need a refill on your cardiac medications before your next appointment, please call your pharmacy*  Lab Work: If you have labs (blood work) drawn today and your tests are completely normal, you will receive your results only by: [place] (if you have MyChart) OR A paper copy in the mail If you have any lab test that is abnormal or we need to change your treatment, we will call you to review the results.   Testing/Procedures: Your physician has requested that you have a lexiscan myoview. For further information please visit [email or web address]. Please follow instruction sheet, as given.  Follow-Up: At [facility], you and your health needs are our priority.  As part of our continuing mission to provide you with exceptional heart care, we have created designated Provider Care Teams.  These Care Teams include your primary Cardiologist (physician) and Advanced Practice Providers (APPs -  Physician Assistants and Nurse Practitioners) who all work together to provide you with the care you need, when you need it.  We recommend signing up for the patient portal called "MyChart".  Sign up information is provided on this After Visit Summary.  MyChart is used to connect with patients for Virtual Visits (Telemedicine).  Patients are able to view lab/test results, encounter notes, upcoming appointments, etc.  Non-urgent messages can be sent to your provider as well.   To learn more about what you can do with MyChart, go to [email or web address].    Your next appointment:   As needed  The format for your next appointment:   In Person  Provider:   You may see Dr. Tamala Julian or one of the following Advanced Practice Providers on your designated Care Team:   Kathyrn Drown, NP

## 2020-10-23 NOTE — Addendum Note (Signed)
Addended by: Belva Crome on: 10/23/2020 03:25 PM   Modules accepted: Orders

## 2020-10-28 ENCOUNTER — Telehealth (HOSPITAL_COMMUNITY): Payer: Self-pay | Admitting: *Deleted

## 2020-10-28 NOTE — Telephone Encounter (Signed)
Patient's wife was returning call. Please advise 

## 2020-10-28 NOTE — Telephone Encounter (Signed)
Left message on voicemail per DPR in reference to upcoming appointment scheduled on  11/03/20 with detailed instructions given per Myocardial Perfusion Study Information Sheet for the test. LM to arrive 15 minutes early, and that it is imperative to arrive on time for appointment to keep from having the test rescheduled. If you need to cancel or reschedule your appointment, please call the office within 24 hours of your appointment. Failure to do so may result in a cancellation of your appointment, and a $50 no show fee. Phone number given for call back for any questions. Kirstie Peri

## 2020-11-03 ENCOUNTER — Ambulatory Visit (HOSPITAL_COMMUNITY): Payer: Medicare Other | Attending: Cardiology

## 2020-11-03 ENCOUNTER — Other Ambulatory Visit: Payer: Self-pay

## 2020-11-03 DIAGNOSIS — Z720 Tobacco use: Secondary | ICD-10-CM | POA: Insufficient documentation

## 2020-11-03 DIAGNOSIS — I2584 Coronary atherosclerosis due to calcified coronary lesion: Secondary | ICD-10-CM | POA: Diagnosis not present

## 2020-11-03 DIAGNOSIS — I2510 Atherosclerotic heart disease of native coronary artery without angina pectoris: Secondary | ICD-10-CM | POA: Insufficient documentation

## 2020-11-03 DIAGNOSIS — E782 Mixed hyperlipidemia: Secondary | ICD-10-CM | POA: Insufficient documentation

## 2020-11-03 DIAGNOSIS — R7302 Impaired glucose tolerance (oral): Secondary | ICD-10-CM | POA: Diagnosis not present

## 2020-11-03 DIAGNOSIS — I10 Essential (primary) hypertension: Secondary | ICD-10-CM | POA: Diagnosis not present

## 2020-11-03 LAB — MYOCARDIAL PERFUSION IMAGING
LV dias vol: 107 mL (ref 62–150)
LV sys vol: 49 mL
Peak HR: 110 {beats}/min
Rest HR: 87 {beats}/min
SDS: 0
SRS: 0
SSS: 0
TID: 1.12

## 2020-11-03 MED ORDER — REGADENOSON 0.4 MG/5ML IV SOLN
0.4000 mg | Freq: Once | INTRAVENOUS | Status: AC
  Administered 2020-11-03: 0.4 mg via INTRAVENOUS

## 2020-11-03 MED ORDER — TECHNETIUM TC 99M TETROFOSMIN IV KIT
31.7000 | PACK | Freq: Once | INTRAVENOUS | Status: AC | PRN
Start: 2020-11-03 — End: 2020-11-03
  Administered 2020-11-03: 31.7 via INTRAVENOUS
  Filled 2020-11-03: qty 32

## 2020-11-03 MED ORDER — TECHNETIUM TC 99M TETROFOSMIN IV KIT
10.9000 | PACK | Freq: Once | INTRAVENOUS | Status: AC | PRN
  Administered 2020-11-03: 10.9 via INTRAVENOUS
  Filled 2020-11-03: qty 11

## 2020-11-04 NOTE — Telephone Encounter (Signed)
Pt's wife is returning call for pt's results from 11/03/20 tests. Please advise pt's wife

## 2020-11-04 NOTE — Telephone Encounter (Signed)
Spoke with wife, DPR on file. Made her aware of results and recommendations.  Wife appreciative for call.

## 2020-11-10 ENCOUNTER — Encounter: Payer: Self-pay | Admitting: Gastroenterology

## 2020-11-15 ENCOUNTER — Other Ambulatory Visit: Payer: Self-pay

## 2020-11-15 ENCOUNTER — Emergency Department (HOSPITAL_COMMUNITY)
Admission: EM | Admit: 2020-11-15 | Discharge: 2020-11-15 | Discharge status: Against Medical Advice | Payer: Medicare Other

## 2020-11-15 NOTE — ED Notes (Signed)
Called x2 for triage, no response, not visualized in lobby or outside the ED.

## 2020-11-16 ENCOUNTER — Ambulatory Visit
Admission: EM | Admit: 2020-11-16 | Discharge: 2020-11-16 | Disposition: A | Discharge status: Home / Self Care | Payer: Medicare Other | Attending: Physician Assistant | Admitting: Physician Assistant

## 2020-11-16 VITALS — BP 155/88 | HR 99 | Temp 97.8°F | Resp 18

## 2020-11-16 DIAGNOSIS — M7989 Other specified soft tissue disorders: Secondary | ICD-10-CM | POA: Diagnosis not present

## 2020-11-16 DIAGNOSIS — Z7689 Persons encountering health services in other specified circumstances: Secondary | ICD-10-CM | POA: Diagnosis not present

## 2020-11-16 DIAGNOSIS — R600 Localized edema: Secondary | ICD-10-CM

## 2020-11-16 LAB — POCT URINALYSIS DIP (MANUAL ENTRY)
Bilirubin, UA: NEGATIVE
Blood, UA: NEGATIVE
Glucose, UA: NEGATIVE mg/dL
Ketones, POC UA: NEGATIVE mg/dL
Leukocytes, UA: NEGATIVE
Nitrite, UA: NEGATIVE
Protein Ur, POC: NEGATIVE mg/dL
Spec Grav, UA: >=1.03 — AB (ref 1.010–1.025)
Urobilinogen, UA: 0.2 E.U./dL
pH, UA: 6.5 (ref 5.0–8.0)

## 2020-11-16 NOTE — ED Provider Notes (Signed)
[facility]    CSN: [id number] Arrival date & time: 11/16/20  1018      History   Chief Complaint Chief Complaint  Patient presents with   Leg Swelling    HPI [name] is a 65 y.o. male.   Patient presents today with a several day history of extremity swelling.  Reports symptoms began after he had nuclear stress test on 11/03/2020 that was reassuring.  Since that time he has had swelling in bilateral hands as well as feet but this is worse in his right leg.  He denies any significant pain.  Denies any known injury.  He denies history of DVT.  Denies any chest pain or shortness of breath.  He denies any recent hospitalization, surgery, immobilization.  He was previously on diuretic medication but this was discontinued at the recommendation of his nephrologist and he has not required use of this medication until recently.  He denies any increased edema consumption or additional medication changes.  He denies any significant pain in leg or associated erythema/warmth to touch.  He denies any recent COVID-19 exposure or illness.  He is able to ambulate unassisted without difficulty.  He does report previous partial thyroidectomy but denies history of hypothyroidism.   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   LOW BACK PAIN, CHRONIC 11/08/2007   Pancreatitis    Prostate cancer ([place])    Rectal abscess    SCHIZOPHRENIA NEC, CHRONIC 01/25/2007    SCOLIOSIS NEC 01/25/2007   Sebaceous cyst 08/10/2010   SMOKER 08/05/2009   UPPER GASTROINTESTINAL HEMORRHAGE 01/25/2007    Patient Active Problem List   Diagnosis Date Noted   Coronary artery calcification 08/15/2020   Nodule of apex of right lung 05/29/2020   Malignant neoplasm of prostate ([place]) 10/26/2019   Body mass index (BMI) 25.0-25.9, adult 10/25/2019   Spondylosis of cervical region without myelopathy or radiculopathy 09/26/2019   Numbness of hand 06/22/2019   Ulnar neuropathy at elbow of left upper extremity 06/05/2019   Chronic obstructive pulmonary disease ([place]) 10/24/2018   Cervical radiculopathy at C8 01/18/2018   Right lateral epicondylitis 12/21/2017   Pain in joint, shoulder region [id number]   Periumbilical abdominal pain 02/13/2015   Elevated PSA 08/04/2014   Subacromial bursitis 04/11/2014   Neck pain on right side 03/05/2014   Bilateral shoulder pain 03/05/2014   Recurrent boils 03/05/2014   Foreign body in stomach 12/31/2013   Reflux esophagitis 12/31/2013   Weight loss 05/06/2012   Incisional hernia 11/23/2011   Cramp of limb 01/13/2011   Hyperthyroidism 08/11/2010   Sebaceous cyst 08/10/2010   HYPERTROPHY PROSTATE W/UR OBST & OTH LUTS 02/27/2010   Tobacco abuse 08/05/2009   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 Zella Richer   COLONOSCOPY  04/23/2010   normal   ESOPHAGOGASTRODUODENOSCOPY N/A 12/31/2013   Procedure: ESOPHAGOGASTRODUODENOSCOPY (EGD);  Surgeon: Gatha Mayer, MD;   Location: [facility];  Service: Endoscopy;  Laterality: N/A;   Excision of scalp lesion  07/2009   Excision of thyroid mass     GANGLION CYST EXCISION     right foot x 2   HERNIA REPAIR     ventral   INCISIONAL HERNIA REPAIR  12/01/2011   Procedure: LAPAROSCOPIC INCISIONAL HERNIA;  Surgeon: Harl Bowie, MD;  Location: [facility];  Service: General;  Laterality: N/A;   SHOULDER SURGERY     right       Home Medications    Prior to Admission medications   Medication Sig Start Date End Date Taking? Authorizing Provider  acetaminophen (TYLENOL) 500 MG tablet Take 1,000 mg by mouth every 6 (six) hours as needed for moderate pain.     [provider]  albuterol (PROVENTIL) (2.5 MG/3ML) 0.083% nebulizer solution INHALE 3 ML BY NEBULIZATION EVERY 6 HOURS AS NEEDED FOR WHEEZING OR SHORTNESS OF BREATH 08/06/20   Marrian Salvage, FNP  albuterol (VENTOLIN HFA) 108 (90 Base) MCG/ACT inhaler TAKE 2 PUFFS BY MOUTH EVERY 6 HOURS AS NEEDED FOR WHEEZE OR SHORTNESS OF BREATH 08/12/20   Margaretha Seeds, MD  amLODipine (NORVASC) 10 MG tablet Take 1 tablet (10 mg total) by mouth daily. 08/22/20   Marrian Salvage, FNP  benztropine (COGENTIN) 0.5 MG tablet Take 0.5 mg by mouth 2 (two) times daily.  05/10/19   [provider]  Blood Pressure Monitoring (BLOOD PRESSURE CUFF) MISC Use daily as directed to check blood pressure 06/01/18   Marrian Salvage, FNP  budesonide-formoterol [facility]) 160-4.5 MCG/ACT inhaler Inhale 2 puffs into the lungs in the morning and at bedtime. TAKE 2 PUFFS BY MOUTH TWICE A DAY 08/22/20   Marrian Salvage, FNP  cyclobenzaprine (FLEXERIL) 10 MG tablet TAKE 1 TABLET BY MOUTH EVERYDAY AT BEDTIME 06/25/20   Marrian Salvage, FNP  diclofenac Sodium (VOLTAREN) 1 % GEL Apply 2 g topically 4 (four) times daily as needed (pain). 08/22/20   Marrian Salvage, FNP  esomeprazole (NEXIUM) 40 MG capsule TAKE 1 CAPSULE BY MOUTH EVERY DAY 08/22/20    Marrian Salvage, FNP  fluPHENAZine (PROLIXIN) 10 MG tablet Take 2 tablets (20 mg total) by mouth daily. 10/19/17   Marrian Salvage, FNP  fluticasone [facility]) 50 MCG/ACT nasal spray SPRAY 2 SPRAYS INTO EACH NOSTRIL EVERY DAY 08/05/20   Marrian Salvage, FNP  gabapentin (NEURONTIN) 300 MG capsule Take 1 capsule (300 mg total) by mouth 2 (two) times daily. 08/22/20   Marrian Salvage, FNP  KLOR-CON M20 20 MEQ tablet Take 1 tablet (20 mEq total) by mouth daily. 08/22/20   Marrian Salvage, FNP  losartan (COZAAR) 25 MG tablet     [provider]  naproxen sodium (ALEVE) 220 MG tablet Take 440 mg by mouth daily as needed (pain).     [provider]  oxyCODONE-acetaminophen (PERCOCET/ROXICET) 5-325 MG tablet Take 1 tablet by mouth every 6 (six) hours as needed for pain. Patient not taking: Reported on 10/22/2020 08/23/19   [provider]  rosuvastatin (CRESTOR)  5 MG tablet     [provider]  tamsulosin (FLOMAX) 0.4 MG CAPS capsule Take 0.4 mg by mouth daily. 09/29/18   [provider]  valsartan (DIOVAN) 160 MG tablet Take 1 tablet (160 mg total) by mouth daily. 08/22/20   Marrian Salvage, FNP    Family History Family History  Problem Relation Age of Onset   Hypertension Father    Kidney disease Father    Hypertension Mother    Bone cancer Mother    Prostate cancer Other    Kidney disease Brother    Colon cancer Neg Hx     Social History Social History   Tobacco Use   Smoking status: Every Day    Packs/day: 1.50    Years: 49.00    Pack years: 73.50    Types: Cigarettes    Start date: 1971   Smokeless tobacco: Never   Tobacco comments:    still smoking packp er day 08/12/2020  Vaping Use   Vaping Use: Never used  Substance Use Topics   Alcohol use: No   Drug use: Yes    Frequency: 14.0 times per week    Types: Marijuana     Allergies   Celecoxib, Iohexol, Ivp dye [iodinated diagnostic agents],  Metoclopramide hcl, Ritalin [methylphenidate], and Wellbutrin [bupropion]   Review of Systems Review of Systems  Constitutional:  Negative for activity change, appetite change, fatigue and fever.  Respiratory:  Negative for cough and shortness of breath.   Cardiovascular:  Positive for leg swelling. Negative for chest pain and palpitations.  Gastrointestinal:  Negative for abdominal pain, diarrhea, nausea and vomiting.  Musculoskeletal:  Negative for arthralgias and myalgias.  Neurological:  Negative for dizziness, light-headedness and headaches.    Physical Exam Triage Vital Signs ED Triage Vitals  Enc Vitals Group     BP 11/16/20 1125 (!) 155/88     Pulse Rate 11/16/20 1125 99     Resp 11/16/20 1125 18     Temp 11/16/20 1125 97.8 F (36.6 C)     Temp Source 11/16/20 1125 Oral     SpO2 11/16/20 1125 95 %     Weight --      Height --      Head Circumference --      Peak Flow --      Pain Score 11/16/20 1126 0     Pain Loc --      Pain Edu? --      Excl. in [place]? --    No data found.  Updated Vital Signs BP (!) 155/88 (BP Location: Left Arm)   Pulse 99   Temp 97.8 F (36.6 C) (Oral)   Resp 18   SpO2 95%   Visual Acuity Right Eye Distance:   Left Eye Distance:   Bilateral Distance:    Right Eye Near:   Left Eye Near:    Bilateral Near:     Physical Exam Vitals reviewed.  Constitutional:      General: He is awake.     Appearance: Normal appearance. He is normal weight. He is not ill-appearing.     Comments: Very pleasant male appears stated age in no acute distress sitting comfortably in exam room  HENT:     Head: Normocephalic and atraumatic.     Mouth/Throat:     Dentition: Abnormal dentition.     Pharynx: Uvula midline. No oropharyngeal exudate or posterior oropharyngeal erythema.  Cardiovascular:     Rate and Rhythm: Normal rate  and regular rhythm.     Heart sounds: Normal heart sounds, S1 normal and S2 normal. No murmur heard. Pulmonary:     Effort:  Pulmonary effort is normal.     Breath sounds: Normal breath sounds. No stridor. No wheezing, rhonchi or rales.     Comments: Clear to auscultation bilaterally Abdominal:     General: Bowel sounds are normal.     Palpations: Abdomen is soft.     Tenderness: There is no abdominal tenderness.     Comments: Benign abdominal exam  Musculoskeletal:     Right lower leg: 2+ Edema present.     Left lower leg: 1+ Edema present.  Neurological:     Mental Status: He is alert.  Psychiatric:        Speech: Speech is delayed and slurred.        Behavior: Behavior is cooperative.     UC Treatments / Results  Labs (all labs ordered are listed, but only abnormal results are displayed) Labs Reviewed  POCT URINALYSIS DIP (MANUAL ENTRY) - Abnormal; Notable for the following components:      Result Value   Spec Grav, UA >=1.030 (*)    All other components within normal limits  CBC  COMPREHENSIVE METABOLIC PANEL  BRAIN NATRIURETIC PEPTIDE  TSH    EKG   Radiology No results found.  Procedures Procedures (including critical care time)  Medications Ordered in UC Medications - No data to display  Initial Impression / Assessment and Plan / UC Course  I have reviewed the triage vital signs and the nursing notes.  Pertinent labs & imaging results that were available during my care of the patient were reviewed by me and considered in my medical decision making (see chart for details).     Given unilateral leg swelling will obtain DVT ultrasound.  Unfortunately, this is unable to be obtained today as it is the weekend but will have patient get this done first thing tomorrow morning.  Basic labs including CBC, CMP, TSH, BNP obtained today-results pending.  Will consider low-dose furosemide if kidney function is normal and DVT study is negative.  Recommend patient use conservative treatment measures including avoiding salt, keeping leg elevated, using compression.  Discussed alarm symptoms or  warrant emergent evaluation.  Strict return precautions given to which patient expressed understanding.   Final Clinical Impressions(s) / UC Diagnoses   Final diagnoses:  Localized edema   Right leg swelling     Discharge Instructions      We will get the ultrasound first thing tomorrow.  We will contact you if your lab work is abnormal.  Avoid salt.  Keep your leg elevated.  Use compression (Ace wrap).  If you develop any additional symptoms including pain, redness, shortness of breath, chest pain you need to go to the emergency room as we discussed.     ED Prescriptions   None    PDMP not reviewed this encounter.   Terrilee Croak, PA-C 11/16/20 1156

## 2020-11-16 NOTE — Discharge Instructions (Addendum)
We will get the ultrasound first thing tomorrow.  We will contact you if your lab work is abnormal.  Avoid salt.  Keep your leg elevated.  Use compression (Ace wrap).  If you develop any additional symptoms including pain, redness, shortness of breath, chest pain you need to go to the emergency room as we discussed.

## 2020-11-16 NOTE — ED Triage Notes (Addendum)
States he had a cardiac stress test last week, since then has noticed bilateral hand and foot swelling. Denies SOB, chest pain, new cough. States his kidney doctor took him off his fluid pills a year ago. He is focused mostly on the right foot due to it swelling worse than other extremities. Denies injury to area.

## 2020-11-17 ENCOUNTER — Ambulatory Visit (HOSPITAL_COMMUNITY)
Admission: RE | Admit: 2020-11-17 | Discharge: 2020-11-17 | Disposition: A | Discharge status: Home / Self Care | Payer: Medicare Other | Source: Ambulatory Visit | Attending: Physician Assistant | Admitting: Physician Assistant

## 2020-11-17 ENCOUNTER — Other Ambulatory Visit: Payer: Self-pay

## 2020-11-17 DIAGNOSIS — M7989 Other specified soft tissue disorders: Secondary | ICD-10-CM | POA: Diagnosis not present

## 2020-11-17 LAB — CBC
Hematocrit: 43.5 % (ref 37.5–51.0)
Hemoglobin: 14.6 g/dL (ref 13.0–17.7)
MCH: 29.3 pg (ref 26.6–33.0)
MCHC: 33.6 g/dL (ref 31.5–35.7)
MCV: 87 fL (ref 79–97)
Platelets: 305 10*3/uL (ref 150–450)
RBC: 4.99 x10E6/uL (ref 4.14–5.80)
RDW: 13.8 % (ref 11.6–15.4)
WBC: 9.2 10*3/uL (ref 3.4–10.8)

## 2020-11-17 LAB — COMPREHENSIVE METABOLIC PANEL
ALT: 15 IU/L (ref 0–44)
AST: 15 IU/L (ref 0–40)
Albumin/Globulin Ratio: 1.3 (ref 1.2–2.2)
Albumin: 4.2 g/dL (ref 3.8–4.8)
Alkaline Phosphatase: 84 IU/L (ref 44–121)
BUN/Creatinine Ratio: 5 — ABNORMAL LOW (ref 10–24)
BUN: 6 mg/dL — ABNORMAL LOW (ref 8–27)
Bilirubin Total: 0.3 mg/dL (ref 0.0–1.2)
CO2: 24 mmol/L (ref 20–29)
Calcium: 9.3 mg/dL (ref 8.6–10.2)
Chloride: 102 mmol/L (ref 96–106)
Creatinine, Ser: 1.31 mg/dL — ABNORMAL HIGH (ref 0.76–1.27)
Globulin, Total: 3.2 g/dL (ref 1.5–4.5)
Glucose: 97 mg/dL (ref 65–99)
Potassium: 3.7 mmol/L (ref 3.5–5.2)
Sodium: 142 mmol/L (ref 134–144)
Total Protein: 7.4 g/dL (ref 6.0–8.5)
eGFR: 61 mL/min/{1.73_m2} (ref >59)

## 2020-11-17 LAB — TSH: TSH: 1.44 u[IU]/mL (ref 0.450–4.500)

## 2020-11-17 LAB — BRAIN NATRIURETIC PEPTIDE

## 2020-11-17 NOTE — Progress Notes (Signed)
Lower extremity venous RT study completed.   Please see CV Proc for preliminary results.   Anival Pasha, RDMS, RVT  

## 2020-11-19 ENCOUNTER — Encounter: Payer: Self-pay | Admitting: Gastroenterology

## 2020-11-20 ENCOUNTER — Other Ambulatory Visit (INDEPENDENT_AMBULATORY_CARE_PROVIDER_SITE_OTHER): Payer: Medicare Other

## 2020-11-20 ENCOUNTER — Ambulatory Visit (INDEPENDENT_AMBULATORY_CARE_PROVIDER_SITE_OTHER)
Admission: RE | Admit: 2020-11-20 | Discharge: 2020-11-20 | Disposition: A | Discharge status: Home / Self Care | Payer: Medicare Other | Source: Ambulatory Visit | Attending: Family | Admitting: Family

## 2020-11-20 ENCOUNTER — Ambulatory Visit (INDEPENDENT_AMBULATORY_CARE_PROVIDER_SITE_OTHER): Payer: Medicare Other | Admitting: Family

## 2020-11-20 ENCOUNTER — Other Ambulatory Visit: Payer: Self-pay

## 2020-11-20 VITALS — BP 112/70 | HR 94 | Temp 98.2°F | Ht 69.0 in | Wt 265.2 lb

## 2020-11-20 DIAGNOSIS — R600 Localized edema: Secondary | ICD-10-CM

## 2020-11-20 DIAGNOSIS — R079 Chest pain, unspecified: Secondary | ICD-10-CM | POA: Diagnosis not present

## 2020-11-20 LAB — BRAIN NATRIURETIC PEPTIDE: Pro B Natriuretic peptide (BNP): 13 pg/mL (ref 0.0–100.0)

## 2020-11-20 MED ORDER — FUROSEMIDE 20 MG PO TABS: 20.0000 mg | ORAL_TABLET | Freq: Every day | ORAL | 0 refills | Status: DC | PRN

## 2020-11-20 NOTE — Progress Notes (Signed)
[name] is a 65 y.o. male with the following history as recorded in EpicCare:  Patient Active Problem List   Diagnosis Date Noted   Coronary artery calcification 08/15/2020   Nodule of apex of right lung 05/29/2020   Malignant neoplasm of prostate ([name]) 10/26/2019   Body mass index (BMI) 25.0-25.9, adult 10/25/2019   Spondylosis of cervical region without myelopathy or radiculopathy 09/26/2019   Numbness of hand 06/22/2019   Ulnar neuropathy at elbow of left upper extremity 06/05/2019   Chronic obstructive pulmonary disease ([name]) 10/24/2018   Cervical radiculopathy at C8 01/18/2018   Right lateral epicondylitis 12/21/2017   Pain in joint, shoulder region [id number]   Periumbilical abdominal pain 02/13/2015   Elevated PSA 08/04/2014   Subacromial bursitis 04/11/2014   Neck pain on right side 03/05/2014   Bilateral shoulder pain 03/05/2014   Recurrent boils 03/05/2014   Foreign body in stomach 12/31/2013   Reflux esophagitis 12/31/2013   Weight loss 05/06/2012   Incisional hernia 11/23/2011   Cramp of limb 01/13/2011   Hyperthyroidism 08/11/2010   Sebaceous cyst 08/10/2010   HYPERTROPHY PROSTATE W/UR OBST & OTH LUTS 02/27/2010   Tobacco abuse 08/05/2009   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      albuterol (PROVENTIL) (2.5 MG/3ML) 0.083% nebulizer solution INHALE 3 ML BY NEBULIZATION EVERY 6 HOURS AS NEEDED FOR WHEEZING OR SHORTNESS OF BREATH 150  mL 1   albuterol (VENTOLIN HFA) 108 (90 Base) MCG/ACT inhaler TAKE 2 PUFFS BY MOUTH EVERY 6 HOURS AS NEEDED FOR WHEEZE OR SHORTNESS OF BREATH 8.5 each 6   amLODipine (NORVASC) 10 MG tablet Take 1 tablet (10 mg total) by mouth daily. 90 tablet 3   benztropine (COGENTIN) 0.5 MG tablet Take 0.5 mg by mouth 2 (two) times daily.      Blood Pressure Monitoring (BLOOD PRESSURE CUFF) MISC Use daily as directed to check blood pressure 1 each 0   budesonide-formoterol (SYMBICORT) 160-4.5 MCG/ACT inhaler Inhale 2 puffs into the lungs in the morning and at bedtime. TAKE 2 PUFFS BY MOUTH TWICE A DAY 30.6 g 1   cyclobenzaprine (FLEXERIL) 10 MG tablet TAKE 1 TABLET BY MOUTH EVERYDAY AT BEDTIME 30 tablet 3   diclofenac Sodium (VOLTAREN) 1 % GEL Apply 2 g topically 4 (four) times daily as needed (pain). 150 g 1   esomeprazole (NEXIUM) 40 MG capsule TAKE 1 CAPSULE BY MOUTH EVERY DAY 90 capsule 3   fluPHENAZine (PROLIXIN) 10 MG tablet Take 2 tablets (20 mg total) by mouth daily. 60 tablet 0   fluticasone (FLONASE) 50 MCG/ACT nasal spray SPRAY 2 SPRAYS INTO EACH NOSTRIL EVERY DAY 48 mL 2   furosemide (LASIX) 20 MG tablet Take 1 tablet (20 mg total) by mouth daily as needed for edema. 30 tablet 0   gabapentin (NEURONTIN) 300 MG capsule Take 1 capsule (300 mg total) by mouth 2 (two) times daily. 180 capsule 3   KLOR-CON M20 20 MEQ tablet Take 1 tablet (20 mEq total) by mouth daily. [place]  tablet 3   naproxen sodium (ALEVE) 220 MG tablet Take 440 mg by mouth daily as needed (pain).      tamsulosin (FLOMAX) 0.4 MG CAPS capsule Take 0.4 mg by mouth daily.     valsartan (DIOVAN) 160 MG tablet Take 1 tablet (160 mg total) by mouth daily. 90 tablet 3   No current facility-administered medications for this visit.    Allergies: Celecoxib, Iohexol, Ivp dye [iodinated diagnostic agents], Metoclopramide hcl, Ritalin [methylphenidate], and Wellbutrin [bupropion]  Past Medical History:  Diagnosis Date   ALLERGIC RHINITIS 01/25/2007    ANXIETY DISORDER, GENERALIZED 01/25/2007   Arthritis    Chronic kidney disease    stage 3   DEPRESSION 01/25/2007   ESOPHAGITIS 12/28/2007   Fatty liver 10/09/2010   GERD 01/25/2007   GLUCOSE INTOLERANCE, HX OF 01/25/2007   Heart murmur    hx of    HEMORRHOIDS, INTERNAL, WITH BLEEDING 04/26/2008   Hiatal hernia    HYPERLIPIDEMIA 12/27/2007   HYPERTENSION, ESSENTIAL NOS 01/25/2007   Hyperthyroidism 08/11/2010   HYPERTROPHY PROSTATE W/UR OBST & OTH LUTS 02/27/2010   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 Zella Richer   COLONOSCOPY  04/23/2010   normal   ESOPHAGOGASTRODUODENOSCOPY N/A 12/31/2013   Procedure: ESOPHAGOGASTRODUODENOSCOPY (EGD);  Surgeon: Gatha Mayer, MD;  Location: [facility];  Service: Endoscopy;  Laterality: N/A;   Excision of scalp lesion  07/2009   Excision of thyroid mass     GANGLION CYST EXCISION     right foot x 2   HERNIA REPAIR     ventral   INCISIONAL HERNIA REPAIR  12/01/2011   Procedure: LAPAROSCOPIC INCISIONAL HERNIA;  Surgeon: Harl Bowie, MD;  Location: [facility];  Service: General;  Laterality: N/A;   SHOULDER SURGERY     right    Family History  Problem Relation Age of Onset   Hypertension Father    Kidney disease Father    Hypertension Mother    Bone cancer Mother    Prostate cancer Other    Kidney disease Brother    Colon cancer Neg Hx     Social History   Tobacco Use   Smoking status: Every Day    Packs/day: 1.50    Years: 49.00    Pack years: 73.50    Types: Cigarettes    Start date: 1971   Smokeless tobacco: Never   Tobacco comments:    still smoking packp  er day 08/12/2020  Substance Use Topics   Alcohol use: No    Subjective:   Follow up on pedal edema- was seen at [facility] week with concerns for right leg swelling; DVT negative; has taken diuretic in the past but this was stopped by nephrology; has gained almost 20 pounds in the past year; has been on Amlodipine 10 years "for years" with no issues.     Objective:  Vitals:   11/20/20 1333  BP: 112/70  Pulse: 94  Temp: 98.2 F (36.8 C)  TempSrc: Oral  SpO2: 97%  Weight: 265 lb 3.2 oz (120.3 kg)  Height: '5\' 9"'$  (1.753 m)  General: Well developed, well nourished, in no acute distress  Skin : Warm and dry.  Head: Normocephalic and atraumatic  Eyes: Sclera and conjunctiva clear; pupils round and reactive to light; extraocular movements intact  Ears: External normal; canals clear; tympanic membranes normal  Oropharynx: Pink, supple. No suspicious lesions  Neck: Supple without thyromegaly, adenopathy  Lungs: Respirations unlabored; wheezing noted in all 4 lobes CVS exam: normal rate and regular rhythm.  Extremities: pedal edema right leg greater than left, swelling noted of hands, no cyanosis, no clubbing  Vessels: Symmetric bilaterally  Neurologic: Alert and oriented; speech intact; face symmetrical; moves all extremities well; CNII-XII intact without focal deficit   Assessment:  1. Pedal edema     Plan:  Update CXR and BNP today; Rx for Lasix 20 mg qd prn for swelling; follow up to be determined.  This visit occurred during the SARS-CoV-2 public health emergency.  Safety protocols were in place, including screening questions prior to the visit, additional usage of staff PPE, and extensive cleaning of exam room while observing appropriate contact time as indicated for disinfecting solutions.    No follow-ups on file.  Orders Placed This Encounter  Procedures   DG Chest 2 View    Standing Status:   Future    Number of Occurrences:   1    Standing Expiration Date:   11/20/2021     Order Specific Question:   Reason for Exam (SYMPTOM  OR DIAGNOSIS REQUIRED)    Answer:   pedal edema    Order Specific Question:   Preferred imaging location?    Answer:   [facility]   Brain natriuretic peptide    Standing Status:   Future    Standing Expiration Date:   11/20/2021    Requested Prescriptions   Signed Prescriptions Disp Refills   furosemide (LASIX) 20 MG tablet 30 tablet 0    Sig: Take 1 tablet (20 mg total) by mouth daily as needed for edema.

## 2020-12-03 ENCOUNTER — Other Ambulatory Visit: Payer: Self-pay | Admitting: *Deleted

## 2020-12-03 DIAGNOSIS — R0989 Other specified symptoms and signs involving the circulatory and respiratory systems: Secondary | ICD-10-CM

## 2020-12-04 ENCOUNTER — Other Ambulatory Visit: Payer: Self-pay | Admitting: Family

## 2020-12-15 ENCOUNTER — Ambulatory Visit (HOSPITAL_COMMUNITY)
Admission: RE | Admit: 2020-12-15 | Discharge: 2020-12-15 | Disposition: A | Discharge status: Home / Self Care | Payer: Medicare Other | Source: Ambulatory Visit | Attending: Surgery | Admitting: Surgery

## 2020-12-15 ENCOUNTER — Other Ambulatory Visit: Payer: Self-pay

## 2020-12-15 ENCOUNTER — Encounter: Payer: Self-pay | Admitting: Physician Assistant

## 2020-12-15 ENCOUNTER — Ambulatory Visit (INDEPENDENT_AMBULATORY_CARE_PROVIDER_SITE_OTHER): Payer: Medicare Other | Admitting: Physician Assistant

## 2020-12-15 VITALS — BP 137/90 | HR 99 | Temp 98.1°F | Resp 20 | Ht 69.0 in | Wt 259.4 lb

## 2020-12-15 DIAGNOSIS — R609 Edema, unspecified: Secondary | ICD-10-CM

## 2020-12-15 DIAGNOSIS — R0989 Other specified symptoms and signs involving the circulatory and respiratory systems: Secondary | ICD-10-CM | POA: Insufficient documentation

## 2020-12-15 DIAGNOSIS — I6522 Occlusion and stenosis of left carotid artery: Secondary | ICD-10-CM

## 2020-12-15 NOTE — Progress Notes (Signed)
History of Present Illness:  Patient is a 65 y.o. year old male who presents for evaluation of asymptomatic carotid stenosis.  The patient denies symptoms of TIA, amaurosis, or stroke.  The patient is currently on 325 mg ASA daily antiplatelet therapy.  He states he had a recent stress test and developed edema after it he had a DVT study which was negative.  He denise rest pain , claudication and non healing wounds.  He states the edema is getting better.  The patient suffers from stage III chronic renal insufficiency.  He states that he has been on a statin in the past but had this discontinued because of leg weakness.  He is medically managed for hypertension.  He is a schizophrenic and current smoker.   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 Zella Richer   COLONOSCOPY  04/23/2010   normal   ESOPHAGOGASTRODUODENOSCOPY N/A 12/31/2013   Procedure: ESOPHAGOGASTRODUODENOSCOPY (EGD);  Surgeon: Gatha Mayer, MD;  Location: [facility];  Service: Endoscopy;  Laterality: N/A;   Excision  of scalp lesion  07/2009   Excision of thyroid mass     GANGLION CYST EXCISION     right foot x 2   HERNIA REPAIR     ventral   INCISIONAL HERNIA REPAIR  12/01/2011   Procedure: LAPAROSCOPIC INCISIONAL HERNIA;  Surgeon: Harl Bowie, MD;  Location: [facility];  Service: General;  Laterality: N/A;   SHOULDER SURGERY     right     Social History Social History   Tobacco Use   Smoking status: Every Day    Packs/day: 1.50    Years: 49.00    Pack years: 73.50    Types: Cigarettes    Start date: 1971   Smokeless tobacco: Never   Tobacco comments:    still smoking packp er day 08/12/2020  Vaping Use   Vaping Use: Never used  Substance Use Topics   Alcohol use: No   Drug use: Yes    Frequency: 14.0 times per week    Types: Marijuana    Family History Family History  Problem Relation Age of Onset   Hypertension Father    Kidney disease Father    Hypertension Mother    Bone cancer Mother    Prostate cancer Other    Kidney disease Brother    Colon cancer Neg Hx     Allergies  Allergies  Allergen Reactions   Celecoxib Other (See Comments)  rectal bleeding   Iohexol Other (See Comments)    Affects nerves: triggers schizophrenia   Ivp Dye [Iodinated Diagnostic Agents]    Metoclopramide Hcl Other (See Comments)    Affects nerves: triggers schizophrenia   Ritalin [Methylphenidate]    Wellbutrin [Bupropion] Other (See Comments)    Makes pt smoke heavily     Current Outpatient Medications  Medication Sig Dispense Refill   acetaminophen (TYLENOL) 500 MG tablet Take 1,000 mg by mouth every 6 (six) hours as needed for moderate pain.      albuterol (PROVENTIL) (2.5 MG/3ML) 0.083% nebulizer solution INHALE 3 ML BY NEBULIZATION EVERY 6 HOURS AS NEEDED FOR WHEEZING OR SHORTNESS OF BREATH 150 mL 1   albuterol (VENTOLIN HFA) 108 (90 Base) MCG/ACT inhaler TAKE 2 PUFFS BY MOUTH EVERY 6 HOURS AS NEEDED FOR WHEEZE OR SHORTNESS OF BREATH 8.5 each 6   amLODipine (NORVASC) 10 MG  tablet Take 1 tablet (10 mg total) by mouth daily. 90 tablet 3   aspirin 325 MG tablet Take 325 mg by mouth daily.     benztropine (COGENTIN) 0.5 MG tablet Take 0.5 mg by mouth 2 (two) times daily.      Blood Pressure Monitoring (BLOOD PRESSURE CUFF) MISC Use daily as directed to check blood pressure 1 each 0   budesonide-formoterol (SYMBICORT) 160-4.5 MCG/ACT inhaler Inhale 2 puffs into the lungs in the morning and at bedtime. TAKE 2 PUFFS BY MOUTH TWICE A DAY 30.6 g 1   cyclobenzaprine (FLEXERIL) 10 MG tablet TAKE 1 TABLET BY MOUTH EVERYDAY AT BEDTIME 30 tablet 3   diclofenac Sodium (VOLTAREN) 1 % GEL Apply 2 g topically 4 (four) times daily as needed (pain). 150 g 1   esomeprazole (NEXIUM) 40 MG capsule TAKE 1 CAPSULE BY MOUTH EVERY DAY 90 capsule 3   fluPHENAZine (PROLIXIN) 10 MG tablet Take 2 tablets (20 mg total) by mouth daily. 60 tablet 0   fluticasone (FLONASE) 50 MCG/ACT nasal spray SPRAY 2 SPRAYS INTO EACH NOSTRIL EVERY DAY 48 mL 2   furosemide (LASIX) 20 MG tablet Take 1 tablet (20 mg total) by mouth daily as needed for edema. 30 tablet 0   gabapentin (NEURONTIN) 300 MG capsule Take 1 capsule (300 mg total) by mouth 2 (two) times daily. 180 capsule 3   KLOR-CON M20 20 MEQ tablet Take 1 tablet (20 mEq total) by mouth daily. 90 tablet 3   naproxen sodium (ALEVE) 220 MG tablet Take 440 mg by mouth daily as needed (pain).      sildenafil (VIAGRA) 100 MG tablet Take 100 mg by mouth daily as needed.     tamsulosin (FLOMAX) 0.4 MG CAPS capsule Take 0.4 mg by mouth daily.     valsartan (DIOVAN) 160 MG tablet Take 1 tablet (160 mg total) by mouth daily. 90 tablet 3   No current facility-administered medications for this visit.    ROS:   General:  No weight loss, Fever, chills  HEENT: No recent headaches, no nasal bleeding, no visual changes, no sore throat  Neurologic: No dizziness, blackouts, seizures. No recent symptoms of stroke or mini- stroke. No recent episodes of slurred speech,  or temporary blindness.  Cardiac: No recent episodes of chest pain/pressure, no shortness of breath at rest.  No shortness of breath with exertion.  Denies history of atrial fibrillation or irregular heartbeat  Vascular: No history of rest pain in feet.  No history of claudication.  No history of non-healing ulcer, No history of DVT   Pulmonary:  No home oxygen, no productive cough, no hemoptysis,  No asthma or wheezing  Musculoskeletal:  '[ ]'$  Arthritis, '[ ]'$  Low back pain,  '[ ]'$  Joint pain  Hematologic:No history of hypercoagulable state.  No history of easy bleeding.  No history of anemia  Gastrointestinal: No hematochezia or melena,  No gastroesophageal reflux, no trouble swallowing  Urinary: '[ ]'$  chronic Kidney disease, '[ ]'$  on HD - '[ ]'$  MWF or '[ ]'$  TTHS, '[ ]'$  Burning with urination, '[ ]'$  Frequent urination, '[ ]'$  Difficulty urinating;   Skin: No rashes  Psychological: No history of anxiety,  No history of depression   Physical Examination  Vitals:   12/15/20 1011 12/15/20 1013  BP: 134/86 137/90  Pulse: 99   Resp: 20   Temp: 98.1 F (36.7 C)   TempSrc: Temporal   SpO2: 96%   Weight: 259 lb 6.4 oz (117.7 kg)   Height: '5\' 9"'$  (1.753 m)     Body mass index is 38.31 kg/m.  General:  Alert and oriented, no acute distress HEENT: Normal Neck: No bruit or JVD Pulmonary: Clear to auscultation bilaterally Cardiac: Regular Rate and Rhythm without murmur Gastrointestinal: Soft, non-tender, non-distended, no mass, no scars Skin: No rash Extremity Pulses:  palpable femoral pulses, feet are warm and well perfused with minimal edema to the ankles.  Doppler signals right DP/PT/Peroneal.  Left PT signal.   Musculoskeletal: No deformity or edema  Neurologic: Upper and lower extremity motor 5/5 and symmetric  DATA:  Right Carotid Findings:  +----------+--------+--------+--------+----------------------+--------+            PSV cm/sEDV cm/sStenosisPlaque Description    Comments   +----------+--------+--------+--------+----------------------+--------+  CCA Prox  88      19                                              +----------+--------+--------+--------+----------------------+--------+  CCA Mid   93      22                                              +----------+--------+--------+--------+----------------------+--------+  CCA Distal66      18                                              +----------+--------+--------+--------+----------------------+--------+  ICA Prox  53      23      1-39%   irregular and calcific          +----------+--------+--------+--------+----------------------+--------+  ICA Mid   74      27                                              +----------+--------+--------+--------+----------------------+--------+  ICA Distal74      30                                              +----------+--------+--------+--------+----------------------+--------+  ECA       90      21                                              +----------+--------+--------+--------+----------------------+--------+   +----------+--------+-------+----------------+-------------------+            PSV cm/sEDV cmsDescribe        Arm Pressure (mmHG)  +----------+--------+-------+----------------+-------------------+  Subclavian120            Multiphasic, WNL                     +----------+--------+-------+----------------+-------------------+   +---------+--------+--+--------+--+---------+  VertebralPSV cm/s35EDV cm/s15Antegrade  +---------+--------+--+--------+--+---------+       Left Carotid Findings:  +----------+--------+--------+--------+------------------+--------+            PSV cm/sEDV cm/sStenosisPlaque DescriptionComments  +----------+--------+--------+--------+------------------+--------+  CCA Prox  139     28                                           +----------+--------+--------+--------+------------------+--------+  CCA Mid   92      21                                          +----------+--------+--------+--------+------------------+--------+  CCA Distal76      18                                          +----------+--------+--------+--------+------------------+--------+  ICA Prox  34      13                                          +----------+--------+--------+--------+------------------+--------+  ICA Mid   119     55      40-59%  calcific                    +----------+--------+--------+--------+------------------+--------+  ICA Distal107     44                                          +----------+--------+--------+--------+------------------+--------+  ECA       93      21                                          +----------+--------+--------+--------+------------------+--------+   +----------+--------+--------+----------------+-------------------+            PSV cm/sEDV cm/sDescribe        Arm Pressure (mmHG)  +----------+--------+--------+----------------+-------------------+  Subclavian130             Multiphasic, WNL                     +----------+--------+--------+----------------+-------------------+   +---------+--------+--+--------+--+---------+  VertebralPSV cm/s45EDV cm/s13Antegrade  +---------+--------+--+--------+--+---------+    Summary:  Right  Carotid: Velocities in the right ICA are consistent with a 1-39%  stenosis.   Left Carotid: Velocities in the left ICA are consistent with a 40-59%  stenosis.   Vertebrals:  Bilateral vertebral arteries demonstrate antegrade flow.  Subclavians: Normal flow hemodynamics were seen in bilateral subclavian               arteries   +---------+---------------+---------+-----------+----------+---------------  ----+  RIGHT    CompressibilityPhasicitySpontaneityPropertiesThrombus Aging         +---------+---------------+---------+-----------+----------+---------------  ----+  CFV      Full           Yes      Yes                                        +---------+---------------+---------+-----------+----------+---------------  ----+  SFJ      Full                                                               +---------+---------------+---------+-----------+----------+---------------  ----+  FV Prox  Full                                                               +---------+---------------+---------+-----------+----------+---------------  ----+  FV Mid   Full                                                               +---------+---------------+---------+-----------+----------+---------------  ----+  FV DistalFull                                                               +---------+---------------+---------+-----------+----------+---------------  ----+  PFV      Full                                                               +---------+---------------+---------+-----------+----------+---------------  ----+  POP      Full           Yes      Yes                                        +---------+---------------+---------+-----------+----------+---------------  ----+  PTV      Full  Not well  visualized  +---------+---------------+---------+-----------+----------+---------------  ----+  PERO     Full                                         Not well  visualized  +---------+---------------+---------+-----------+----------+---------------  ----+  Gastroc  Full                                                               +---------+---------------+---------+-----------+----------+---------------  ----+     +----+---------------+---------+-----------+----------+--------------+  LEFTCompressibilityPhasicitySpontaneityPropertiesThrombus Aging   +----+---------------+---------+-----------+----------+--------------+  CFV Full           Yes      Yes                                  +----+---------------+---------+-----------+----------+--------------+     Summary:  RIGHT:  - There is no evidence of deep vein thrombosis in the lower extremity.  However, portions of this examination were limited- see technologist  comments above.     ASSESSMENT:  Asymptomatic Carotid stenosis Duplex shows no significant changes to his ICA stenosis He has doppler signals in B LE without claudication, rest pain or non healing wounds.  He states the edema is improving.  Plan will be f/u in 1 year for carotid duplex and ABI for a baseline.        Roxy Horseman PA-C [facility] Office: [phone]  MD in clinic [place]

## 2021-01-08 ENCOUNTER — Ambulatory Visit (AMBULATORY_SURGERY_CENTER): Payer: Medicare Other

## 2021-01-08 VITALS — Ht 69.0 in | Wt 259.0 lb

## 2021-01-08 DIAGNOSIS — Z1211 Encounter for screening for malignant neoplasm of colon: Secondary | ICD-10-CM

## 2021-01-08 NOTE — Progress Notes (Signed)
No egg or soy allergy known to patient  No issues known to pt with past sedation with any surgeries or procedures Patient denies ever being told they had issues or difficulty with intubation  No FH of Malignant Hyperthermia Pt is not on diet pills Pt is not on  home 02  Pt is not on blood thinners  Pt denies issues with constipation  No A fib or A flutter  EMMI video to pt or via [place] 19 guidelines implemented in PV today with Pt and RN   Pt is fully vaccinated  for Covid   Miralax prep used  Due to the COVID-19 pandemic we are asking patients to follow certain guidelines.  Pt aware of COVID protocols and [facility] guidelines

## 2021-01-22 ENCOUNTER — Ambulatory Visit (AMBULATORY_SURGERY_CENTER): Payer: Medicare Other | Admitting: Gastroenterology

## 2021-01-22 ENCOUNTER — Encounter: Payer: Self-pay | Admitting: Gastroenterology

## 2021-01-22 ENCOUNTER — Other Ambulatory Visit: Payer: Self-pay

## 2021-01-22 VITALS — BP 133/83 | HR 79 | Temp 98.0°F | Resp 18 | Ht 69.0 in | Wt 259.0 lb

## 2021-01-22 DIAGNOSIS — Z5309 Procedure and treatment not carried out because of other contraindication: Secondary | ICD-10-CM | POA: Diagnosis not present

## 2021-01-22 DIAGNOSIS — Z538 Procedure and treatment not carried out for other reasons: Secondary | ICD-10-CM | POA: Diagnosis not present

## 2021-01-22 DIAGNOSIS — Z1211 Encounter for screening for malignant neoplasm of colon: Secondary | ICD-10-CM | POA: Diagnosis not present

## 2021-01-22 DIAGNOSIS — J449 Chronic obstructive pulmonary disease, unspecified: Secondary | ICD-10-CM | POA: Diagnosis not present

## 2021-01-22 HISTORY — PX: COLONOSCOPY WITH PROPOFOL: SHX5780

## 2021-01-22 MED ORDER — SODIUM CHLORIDE 0.9 % IV SOLN: 500.0000 mL | Freq: Once | INTRAVENOUS | Status: DC

## 2021-01-22 NOTE — Progress Notes (Signed)
Sedate, gd SR, tolerated procedure well, VSS, report to RN 

## 2021-01-22 NOTE — Progress Notes (Signed)
History and Physical:  This patient presents for endoscopic testing for: Encounter Diagnosis  Name Primary?   Special screening for malignant neoplasms, colon Yes    Patient denies chest pain or dyspnea. Last colonoscopy Jan 2012  Screening exam today    Past Medical History: Past Medical History:  Diagnosis Date   ALLERGIC RHINITIS 01/25/2007   ANXIETY DISORDER, GENERALIZED 01/25/2007   Arthritis    Chronic kidney disease    stage 3   COPD (chronic obstructive pulmonary disease) ([place])    DEPRESSION 01/25/2007   ESOPHAGITIS 12/28/2007   Fatty liver 10/09/2010   GERD 01/25/2007   GLUCOSE INTOLERANCE, HX OF 01/25/2007   Heart murmur    hx of    HEMORRHOIDS, INTERNAL, WITH BLEEDING 04/26/2008   Hiatal hernia    HYPERLIPIDEMIA 12/27/2007   HYPERTENSION, ESSENTIAL NOS 01/25/2007   Hyperthyroidism 08/11/2010   HYPERTROPHY PROSTATE W/UR OBST & OTH LUTS 02/27/2010   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 Zella Richer   COLONOSCOPY  04/23/2010   normal   ESOPHAGOGASTRODUODENOSCOPY N/A 12/31/2013   Procedure: ESOPHAGOGASTRODUODENOSCOPY (EGD);  Surgeon: Gatha Mayer, MD;  Location: [facility];  Service: Endoscopy;  Laterality: N/A;   Excision of scalp lesion  07/18/2009   Excision of thyroid mass     GANGLION CYST EXCISION     right foot x 2   HERNIA REPAIR     ventral   INCISIONAL HERNIA REPAIR  12/01/2011   Procedure: LAPAROSCOPIC INCISIONAL HERNIA;  Surgeon: Harl Bowie, MD;  Location: [facility];  Service: General;  Laterality: N/A;   SHOULDER  SURGERY     right   UPPER GASTROINTESTINAL ENDOSCOPY      Allergies: Allergies  Allergen Reactions   Celecoxib Other (See Comments)     rectal bleeding   Iohexol Other (See Comments)    Affects nerves: triggers schizophrenia   Ivp Dye [Iodinated Diagnostic Agents]    Metoclopramide Hcl Other (See Comments)    Affects nerves: triggers schizophrenia   Ritalin [Methylphenidate]    Wellbutrin [Bupropion] Other (See Comments)    Makes pt smoke heavily    Outpatient Meds: Current Outpatient Medications  Medication Sig Dispense Refill   acetaminophen (TYLENOL) 500 MG tablet Take 1,000 mg by mouth every 6 (six) hours as needed for moderate pain.      albuterol (PROVENTIL) (2.5 MG/3ML) 0.083% nebulizer solution INHALE 3 ML BY NEBULIZATION EVERY 6 HOURS AS NEEDED FOR WHEEZING OR SHORTNESS OF BREATH 150 mL 1   albuterol (VENTOLIN HFA) 108 (90 Base) MCG/ACT inhaler TAKE 2 PUFFS BY MOUTH EVERY 6 HOURS AS NEEDED FOR WHEEZE OR SHORTNESS OF BREATH 8.5 each 6   amLODipine (NORVASC) 10 MG tablet Take 1 tablet (10 mg total) by mouth daily. 90 tablet 3   benztropine (COGENTIN) 0.5 MG tablet Take 0.5 mg by mouth 2 (two) times daily.      budesonide-formoterol (SYMBICORT) 160-4.5 MCG/ACT inhaler Inhale 2 puffs into the lungs in the morning and at bedtime. TAKE 2 PUFFS BY MOUTH TWICE A DAY 30.6 g 1   cyclobenzaprine (  FLEXERIL) 10 MG tablet TAKE 1 TABLET BY MOUTH EVERYDAY AT BEDTIME 30 tablet 3   diclofenac Sodium (VOLTAREN) 1 % GEL Apply 2 g topically 4 (four) times daily as needed (pain). 150 g 1   esomeprazole (NEXIUM) 40 MG capsule TAKE 1 CAPSULE BY MOUTH EVERY DAY 90 capsule 3   fluPHENAZine (PROLIXIN) 10 MG tablet Take 2 tablets (20 mg total) by mouth daily. 60 tablet 0   fluticasone (FLONASE) 50 MCG/ACT nasal spray SPRAY 2 SPRAYS INTO EACH NOSTRIL EVERY DAY 48 mL 2   furosemide (LASIX) 20 MG tablet Take 1 tablet (20 mg total) by mouth daily as needed for edema. 30 tablet 0   gabapentin (NEURONTIN)  300 MG capsule Take 1 capsule (300 mg total) by mouth 2 (two) times daily. 180 capsule 3   KLOR-CON M20 20 MEQ tablet Take 1 tablet (20 mEq total) by mouth daily. 90 tablet 3   naproxen sodium (ALEVE) 220 MG tablet Take 440 mg by mouth daily as needed (pain).      tamsulosin (FLOMAX) 0.4 MG CAPS capsule Take 0.4 mg by mouth daily.     valsartan (DIOVAN) 160 MG tablet Take 1 tablet (160 mg total) by mouth daily. 90 tablet 3   aspirin 325 MG tablet Take 325 mg by mouth daily.     Blood Pressure Monitoring (BLOOD PRESSURE CUFF) MISC Use daily as directed to check blood pressure 1 each 0   sildenafil (VIAGRA) 100 MG tablet Take 100 mg by mouth daily as needed.     Current Facility-Administered Medications  Medication Dose Route Frequency Provider Last Rate Last Admin   0.9 %  sodium chloride infusion  500 mL Intravenous Once [name], MD          ___________________________________________________________________ Objective   Exam:  BP 130/78   Pulse 94   Temp 98 F (36.7 C)   Ht 5\' 9"  (1.753 m)   Wt 259 lb (117.5 kg)   SpO2 95%   BMI 38.25 kg/m   CV: RRR without murmur, S1/S2 Resp: clear to auscultation bilaterally, normal RR and effort noted GI: soft, no tenderness, with active bowel sounds.   Assessment: Encounter Diagnosis  Name Primary?   Special screening for malignant neoplasms, colon Yes     Plan: Colonoscopy  The benefits and risks of the planned procedure were described in detail with the patient or (when appropriate) their health care proxy.  Risks were outlined as including, but not limited to, bleeding, infection, perforation, adverse medication reaction leading to cardiac or pulmonary decompensation, pancreatitis (if ERCP).  The limitation of incomplete mucosal visualization was also discussed.  No guarantees or warranties were given.    The patient is appropriate for an endoscopic procedure in the ambulatory setting.   - Wilfrid Lund, MD

## 2021-01-22 NOTE — Progress Notes (Signed)
Vital signs checked by:MO  The patient states no changes in medical or surgical history since pre-visit screening on 01/08/21.

## 2021-01-22 NOTE — Patient Instructions (Signed)
Resume previous diet Continue current medications Repeat colonoscopy in 6 months  YOU HAD AN ENDOSCOPIC PROCEDURE TODAY AT [facility]:   Refer to the procedure report that was given to you for any specific questions about what was found during the examination.  If the procedure report does not answer your questions, please call your gastroenterologist to clarify.  If you requested that your care partner not be given the details of your procedure findings, then the procedure report has been included in a sealed envelope for you to review at your convenience later.  YOU SHOULD EXPECT: Some feelings of bloating in the abdomen. Passage of more gas than usual.  Walking can help get rid of the air that was put into your GI tract during the procedure and reduce the bloating. If you had a lower endoscopy (such as a colonoscopy or flexible sigmoidoscopy) you may notice spotting of blood in your stool or on the toilet paper. If you underwent a bowel prep for your procedure, you may not have a normal bowel movement for a few days.  Please Note:  You might notice some irritation and congestion in your nose or some drainage.  This is from the oxygen used during your procedure.  There is no need for concern and it should clear up in a day or so.  SYMPTOMS TO REPORT IMMEDIATELY:  Following lower endoscopy (colonoscopy or flexible sigmoidoscopy):  Excessive amounts of blood in the stool  Significant tenderness or worsening of abdominal pains  Swelling of the abdomen that is new, acute  Fever of 100F or higher  For urgent or emergent issues, a gastroenterologist can be reached at any hour by calling [phone]. Do not use MyChart messaging for urgent concerns.   DIET:  We do recommend a small meal at first, but then you may proceed to your regular diet.  Drink plenty of fluids but you should avoid alcoholic beverages for 24 hours.  ACTIVITY:  You should plan to take it easy for the rest  of today and you should NOT DRIVE or use heavy machinery until tomorrow (because of the sedation medicines used during the test).    FOLLOW UP: Our staff will call the number listed on your records 48-72 hours following your procedure to check on you and address any questions or concerns that you may have regarding the information given to you following your procedure. If we do not reach you, we will leave a message.  We will attempt to reach you two times.  During this call, we will ask if you have developed any symptoms of COVID 19. If you develop any symptoms (ie: fever, flu-like symptoms, shortness of breath, cough etc.) before then, please call [phone].  If you test positive for Covid 19 in the 2 weeks post procedure, please call and report this information to [place].    If any biopsies were taken you will be contacted by phone or by letter within the next 1-3 weeks.  Please call us at [phone] if you have not heard about the biopsies in 3 weeks.   SIGNATURES/CONFIDENTIALITY: You and/or your care partner have signed paperwork which will be entered into your electronic medical record.  These signatures attest to the fact that that the information above on your After Visit Summary has been reviewed and is understood.  Full responsibility of the confidentiality of this discharge information lies with you and/or your care-partner.

## 2021-01-22 NOTE — Op Note (Signed)
[facility] Patient Name: [name] Procedure Date: 01/22/2021 9:17 AM MRN: [id number] Endoscopist: [place]. Loletha Carrow , MD Age: 65 Referring MD:  Date of Birth: 06/24/1955 Gender: Male Account #: [id number] Procedure:                Colonoscopy Indications:              Screening for colorectal malignant neoplasm                           last colonoscopy Jan 2012 Medicines:                Monitored Anesthesia Care Procedure:                Pre-Anesthesia Assessment:                           - Prior to the procedure, a History and Physical                            was performed, and patient medications and                            allergies were reviewed. The patient's tolerance of                            previous anesthesia was also reviewed. The risks                            and benefits of the procedure and the sedation                            options and risks were discussed with the patient.                            All questions were answered, and informed consent                            was obtained. Prior Anticoagulants: The patient has                            taken no previous anticoagulant or antiplatelet                            agents. ASA Grade Assessment: III - A patient with                            severe systemic disease. After reviewing the risks                            and benefits, the patient was deemed in                            satisfactory condition to undergo the procedure.  After obtaining informed consent, the colonoscope                            was passed under direct vision. Throughout the                            procedure, the patient's blood pressure, pulse, and                            oxygen saturations were monitored continuously. The                            CF [id number] #[id number] was introduced through the anus                            with the intention of advancing to  the cecum. The                            scope was advanced to the ascending colon before                            the procedure was aborted. Medications were given.                            The colonoscopy was performed with difficulty due                            to poor bowel prep, a redundant colon, significant                            looping and the patient's body habitus. The patient                            tolerated the procedure. The quality of the bowel                            preparation was poor. Ascending and transverse                            colon were photographed. The bowel preparation used                            was Miralax. Scope In: 9:27:25 AM Scope Out: 9:34:42 AM Total Procedure Duration: 0 hours 7 minutes 17 seconds  Findings:                 The perianal and digital rectal examinations were                            normal.                           A large amount of liquid semi-solid stool was found  in the entire colon, precluding visualization.                            Other challenges including patient's probable sleep                            apnea, body habitus and abdominal breathing. See                            above - the procedure was aborted after advancement                            to the distal ascending colon Complications:            No immediate complications. Estimated Blood Loss:     Estimated blood loss: none. Impression:               - Preparation of the colon was poor.                           - Stool in the entire examined colon.                           - No specimens collected. Recommendation:           - Patient has a contact number available for                            emergencies. The signs and symptoms of potential                            delayed complications were discussed with the                            patient. Return to normal activities tomorrow.                             Written discharge instructions were provided to the                            patient.                           - Resume previous diet.                           - Continue present medications.                           - Repeat colonoscopy in 6 months for screening                            purposes. Ducolax and split dose golytely for next                            exam. [name] L. Loletha Carrow, MD 01/22/2021 9:47:36 AM This report has  been signed electronically.

## 2021-01-26 ENCOUNTER — Telehealth: Payer: Self-pay

## 2021-01-26 ENCOUNTER — Telehealth: Payer: Self-pay | Admitting: *Deleted

## 2021-01-26 NOTE — Telephone Encounter (Signed)
  Follow up Call-  Call back number 01/22/2021  Post procedure Call Back phone  # [phone]  Permission to leave phone message Yes  Some recent data might be hidden     Patient questions: Mailbox has not been set up.

## 2021-01-26 NOTE — Telephone Encounter (Signed)
No answer, unable to leave a message, B.Quintessa Simmerman RN. 

## 2021-02-02 DIAGNOSIS — R3912 Poor urinary stream: Secondary | ICD-10-CM | POA: Diagnosis not present

## 2021-02-18 ENCOUNTER — Other Ambulatory Visit: Payer: Self-pay | Admitting: Family

## 2021-03-08 ENCOUNTER — Other Ambulatory Visit: Payer: Self-pay | Admitting: Family

## 2021-03-10 ENCOUNTER — Encounter: Payer: Self-pay | Admitting: Family

## 2021-03-10 ENCOUNTER — Ambulatory Visit (INDEPENDENT_AMBULATORY_CARE_PROVIDER_SITE_OTHER): Payer: Medicare Other | Admitting: Family

## 2021-03-10 ENCOUNTER — Ambulatory Visit (HOSPITAL_BASED_OUTPATIENT_CLINIC_OR_DEPARTMENT_OTHER)
Admission: RE | Admit: 2021-03-10 | Discharge: 2021-03-10 | Disposition: A | Discharge status: Home / Self Care | Payer: Medicare Other | Source: Ambulatory Visit | Attending: Family | Admitting: Family

## 2021-03-10 ENCOUNTER — Other Ambulatory Visit: Payer: Self-pay

## 2021-03-10 VITALS — BP 148/70 | HR 95 | Temp 98.0°F | Ht 68.0 in | Wt 254.4 lb

## 2021-03-10 DIAGNOSIS — M545 Low back pain, unspecified: Secondary | ICD-10-CM | POA: Insufficient documentation

## 2021-03-10 DIAGNOSIS — M25551 Pain in right hip: Secondary | ICD-10-CM | POA: Diagnosis not present

## 2021-03-10 DIAGNOSIS — F209 Schizophrenia, unspecified: Secondary | ICD-10-CM

## 2021-03-10 DIAGNOSIS — F411 Generalized anxiety disorder: Secondary | ICD-10-CM | POA: Diagnosis not present

## 2021-03-10 DIAGNOSIS — M25559 Pain in unspecified hip: Secondary | ICD-10-CM | POA: Diagnosis not present

## 2021-03-10 NOTE — Progress Notes (Signed)
[name] is a 65 y.o. male with the following history as recorded in EpicCare:  Patient Active Problem List   Diagnosis Date Noted   Coronary artery calcification 08/15/2020   Nodule of apex of right lung 05/29/2020   Malignant neoplasm of prostate ([place]) 10/26/2019   Body mass index (BMI) 25.0-25.9, adult 10/25/2019   Spondylosis of cervical region without myelopathy or radiculopathy 09/26/2019   Numbness of hand 06/22/2019   Ulnar neuropathy at elbow of left upper extremity 06/05/2019   Chronic obstructive pulmonary disease ([place]) 10/24/2018   Cervical radiculopathy at C8 01/18/2018   Right lateral epicondylitis 12/21/2017   Pain in joint, shoulder region 32/95/1884   Periumbilical abdominal pain 02/13/2015   Elevated PSA 08/04/2014   Subacromial bursitis 04/11/2014   Neck pain on right side 03/05/2014   Bilateral shoulder pain 03/05/2014   Recurrent boils 03/05/2014   Foreign body in stomach 12/31/2013   Reflux esophagitis 12/31/2013   Weight loss 05/06/2012   Incisional hernia 11/23/2011   Cramp of limb 01/13/2011   Hyperthyroidism 08/11/2010   Sebaceous cyst 08/10/2010   HYPERTROPHY PROSTATE W/UR OBST & OTH LUTS 02/27/2010   Tobacco abuse 08/05/2009   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      albuterol (PROVENTIL) (2.5 MG/3ML) 0.083% nebulizer solution INHALE 3 ML BY NEBULIZATION EVERY 6 HOURS AS NEEDED FOR WHEEZING OR SHORTNESS OF BREATH 150  mL 1   albuterol (VENTOLIN HFA) 108 (90 Base) MCG/ACT inhaler TAKE 2 PUFFS BY MOUTH EVERY 6 HOURS AS NEEDED FOR WHEEZE OR SHORTNESS OF BREATH 8.5 each 6   amLODipine (NORVASC) 10 MG tablet Take 1 tablet (10 mg total) by mouth daily. 90 tablet 3   aspirin 325 MG tablet Take 325 mg by mouth daily.     Blood Pressure Monitoring (BLOOD PRESSURE CUFF) MISC Use daily as directed to check blood pressure 1 each 0   cyclobenzaprine (FLEXERIL) 10 MG tablet TAKE 1 TABLET BY MOUTH EVERYDAY AT BEDTIME 30 tablet 3   diclofenac Sodium (VOLTAREN) 1 % GEL Apply 2 g topically 4 (four) times daily as needed (pain). 150 g 1   esomeprazole (NEXIUM) 40 MG capsule TAKE 1 CAPSULE BY MOUTH EVERY DAY 90 capsule 3   fluPHENAZine (PROLIXIN) 10 MG tablet Take 2 tablets (20 mg total) by mouth daily. (Patient taking differently: Take 10 mg by mouth daily.) 60 tablet 0   fluticasone (FLONASE) 50 MCG/ACT nasal spray SPRAY 2 SPRAYS INTO EACH NOSTRIL EVERY DAY 48 mL 2   furosemide (LASIX) 20 MG tablet Take 1 tablet (20 mg total) by mouth daily as needed for edema. 30 tablet 0   gabapentin (NEURONTIN) 300 MG capsule Take 1 capsule (300 mg total) by mouth 2 (two) times daily. 180 capsule 3   KLOR-CON M20 20 MEQ tablet Take 1 tablet (20 mEq total) by mouth daily. 90 tablet 3   naproxen sodium (ALEVE) 220 MG tablet Take 440 mg by mouth daily as needed (pain).      sildenafil (VIAGRA)  100 MG tablet Take 100 mg by mouth daily as needed.     tamsulosin (FLOMAX) 0.4 MG CAPS capsule Take 0.4 mg by mouth daily.     valsartan (DIOVAN) 160 MG tablet Take 1 tablet (160 mg total) by mouth daily. 90 tablet 3   benztropine (COGENTIN) 0.5 MG tablet Take 0.5 mg by mouth 2 (two) times daily.      SYMBICORT 160-4.5 MCG/ACT inhaler INHALE 2 PUFFS INTO THE LUNGS IN THE MORNING AND AT BEDTIME. TAKE 2 PUFFS BY MOUTH TWICE A DAY (Patient not taking: Reported on 03/10/2021) 30.6 each 1   No current facility-administered medications for this visit.     Allergies: Celecoxib, Iohexol, Ivp dye [iodinated diagnostic agents], Metoclopramide hcl, Ritalin [methylphenidate], and Wellbutrin [bupropion]  Past Medical History:  Diagnosis Date   ALLERGIC RHINITIS 01/25/2007   ANXIETY DISORDER, GENERALIZED 01/25/2007   Arthritis    Chronic kidney disease    stage 3   COPD (chronic obstructive pulmonary disease) ([place])    DEPRESSION 01/25/2007   ESOPHAGITIS 12/28/2007   Fatty liver 10/09/2010   GERD 01/25/2007   GLUCOSE INTOLERANCE, HX OF 01/25/2007   Heart murmur    hx of    HEMORRHOIDS, INTERNAL, WITH BLEEDING 04/26/2008   Hiatal hernia    HYPERLIPIDEMIA 12/27/2007   HYPERTENSION, ESSENTIAL NOS 01/25/2007   Hyperthyroidism 08/11/2010   HYPERTROPHY PROSTATE W/UR OBST & OTH LUTS 02/27/2010   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 Zella Richer   COLONOSCOPY  04/23/2010   normal   ESOPHAGOGASTRODUODENOSCOPY N/A 12/31/2013   Procedure: ESOPHAGOGASTRODUODENOSCOPY (EGD);  Surgeon: Gatha Mayer, MD;  Location: [facility];  Service: Endoscopy;  Laterality: N/A;   Excision of scalp lesion  07/18/2009   Excision of thyroid mass     GANGLION CYST EXCISION     right foot x 2   HERNIA REPAIR     ventral   INCISIONAL HERNIA REPAIR  12/01/2011   Procedure: LAPAROSCOPIC INCISIONAL HERNIA;  Surgeon: Harl Bowie, MD;  Location: [facility];  Service: General;  Laterality: N/A;   SHOULDER SURGERY     right   UPPER GASTROINTESTINAL ENDOSCOPY      Family History  Problem Relation Age of Onset   Hypertension Mother    Bone cancer Mother    Hypertension  Father    Kidney disease Father    Bone cancer Brother    Kidney disease Brother    Prostate cancer Other    Colon cancer Neg Hx    Colon polyps Neg Hx    Esophageal cancer Neg Hx    Rectal cancer Neg Hx    Stomach cancer Neg Hx     Social History   Tobacco Use   Smoking status: Every Day    Packs/day: 1.50    Years: 49.00    Pack years: 73.50    Types: Cigarettes    Start date: 39   Smokeless tobacco: Never   Tobacco comments:    Started smoking at age 65,still smoking pack - 1-1/2 packs  per day 01/08/2021  Substance Use Topics  Alcohol use: No    Subjective:  Accompanied by his wife; low back pain/ right hip pain x 2-3 weeks; tripped over his dog; requesting X-ray to make sure nothing is broken;  Also requesting referral to new psychiatrist; current psychiatrist does not participate with insurance;      Objective:  Vitals:   03/10/21 1438  BP: (!) 148/70  Pulse: 95  Temp: 98 F (36.7 C)  TempSrc: Oral  SpO2: 98%  Weight: 254 lb 6.4 oz (115.4 kg)  Height: 5\' 8"  (1.727 m)    General: Well developed, well nourished, in no acute distress  Skin : Warm and dry.  Head: Normocephalic and atraumatic  Lungs: Respirations unlabored; Musculoskeletal: No deformities; no active joint inflammation  Extremities: No edema, cyanosis, clubbing  Vessels: Symmetric bilaterally  Neurologic: Alert and oriented; speech intact; face symmetrical; moves all extremities well; CNII-XII intact without focal deficit   Assessment:  1. Schizophrenia, unspecified type ([place])   2. ANXIETY DISORDER, GENERALIZED   3. Hip pain   4. Low back pain, unspecified back pain laterality, unspecified chronicity, unspecified whether sciatica present     Plan:  & 2. Refer to psychiatrist as requested; 3. & 4. Update lumbar x-ray and right hip X-ray; follow up to be determined;   This visit occurred during the SARS-CoV-2 public health emergency.  Safety protocols were in place, including screening  questions prior to the visit, additional usage of staff PPE, and extensive cleaning of exam room while observing appropriate contact time as indicated for disinfecting solutions.    No follow-ups on file.  Orders Placed This Encounter  Procedures   DG Lumbar Spine Complete    Standing Status:   Future    Number of Occurrences:   1    Standing Expiration Date:   03/10/2022    Order Specific Question:   Reason for Exam (SYMPTOM  OR DIAGNOSIS REQUIRED)    Answer:   low back pain    Order Specific Question:   Preferred imaging location?    Answer:   [facility]   DG HIP UNILAT WITH PELVIS 2-3 VIEWS RIGHT    Standing Status:   Future    Number of Occurrences:   1    Standing Expiration Date:   03/10/2022    Order Specific Question:   Reason for Exam (SYMPTOM  OR DIAGNOSIS REQUIRED)    Answer:   right hip pain    Order Specific Question:   Preferred imaging location?    Answer:   [facility]   Ambulatory referral to Psychiatry    Referral Priority:   Routine    Referral Type:   Psychiatric    Referral Reason:   Specialty Services Required    Requested Specialty:   Psychiatry    Number of Visits Requested:   1    Requested Prescriptions    No prescriptions requested or ordered in this encounter

## 2021-03-16 ENCOUNTER — Other Ambulatory Visit: Payer: Self-pay | Admitting: Family

## 2021-03-17 ENCOUNTER — Telehealth: Payer: Self-pay | Admitting: Family

## 2021-03-17 NOTE — Telephone Encounter (Signed)
There was a referral sent on 03/10/21.   Please assist on the follow up on the referral to psychiatrist.   Is there another spot I should look at for the update?

## 2021-03-17 NOTE — Telephone Encounter (Signed)
Patient's wife called regarding the psychiatrist referral she requested on 11/22. Unable to find referral, Please advice.

## 2021-03-18 NOTE — Telephone Encounter (Signed)
Left detailed message on machine to call [facility] [phone] to get scheduled.

## 2021-05-21 ENCOUNTER — Ambulatory Visit (INDEPENDENT_AMBULATORY_CARE_PROVIDER_SITE_OTHER): Payer: Medicare Other | Admitting: Family

## 2021-05-21 VITALS — BP 132/84 | HR 108 | Temp 98.0°F | Resp 18 | Ht 68.0 in | Wt 254.8 lb

## 2021-05-21 DIAGNOSIS — R2689 Other abnormalities of gait and mobility: Secondary | ICD-10-CM

## 2021-05-21 DIAGNOSIS — R296 Repeated falls: Secondary | ICD-10-CM | POA: Diagnosis not present

## 2021-05-21 DIAGNOSIS — G5603 Carpal tunnel syndrome, bilateral upper limbs: Secondary | ICD-10-CM | POA: Diagnosis not present

## 2021-05-21 DIAGNOSIS — M65331 Trigger finger, right middle finger: Secondary | ICD-10-CM | POA: Diagnosis not present

## 2021-05-21 DIAGNOSIS — E0590 Thyrotoxicosis, unspecified without thyrotoxic crisis or storm: Secondary | ICD-10-CM | POA: Diagnosis not present

## 2021-05-21 NOTE — Progress Notes (Signed)
[name] is a 66 y.o. male with the following history as recorded in EpicCare:  Patient Active Problem List   Diagnosis Date Noted   Coronary artery calcification 08/15/2020   Nodule of apex of right lung 05/29/2020   Malignant neoplasm of prostate ([place]) 10/26/2019   Body mass index (BMI) 25.0-25.9, adult 10/25/2019   Spondylosis of cervical region without myelopathy or radiculopathy 09/26/2019   Numbness of hand 06/22/2019   Ulnar neuropathy at elbow of left upper extremity 06/05/2019   Chronic obstructive pulmonary disease ([place]) 10/24/2018   Cervical radiculopathy at C8 01/18/2018   Right lateral epicondylitis 12/21/2017   Pain in joint, shoulder region 46/28/6381   Periumbilical abdominal pain 02/13/2015   Elevated PSA 08/04/2014   Subacromial bursitis 04/11/2014   Neck pain on right side 03/05/2014   Bilateral shoulder pain 03/05/2014   Recurrent boils 03/05/2014   Foreign body in stomach 12/31/2013   Reflux esophagitis 12/31/2013   Weight loss 05/06/2012   Incisional hernia 11/23/2011   Cramp of limb 01/13/2011   Hyperthyroidism 08/11/2010   Sebaceous cyst 08/10/2010   HYPERTROPHY PROSTATE W/UR OBST & OTH LUTS 02/27/2010   Tobacco abuse 08/05/2009   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   HEMORRHOIDS, INTERNAL, WITH BLEEDING 04/26/2008   Hyperlipidemia 12/27/2007   LOW BACK PAIN, CHRONIC 11/08/2007   Schizophrenia ([place]) 01/25/2007   ANXIETY DISORDER, GENERALIZED 01/25/2007   DEPRESSION 01/25/2007   Essential hypertension 01/25/2007   ALLERGIC RHINITIS 01/25/2007   SCOLIOSIS NEC 01/25/2007   Impaired glucose tolerance 01/25/2007    Current Outpatient Medications  Medication Sig Dispense Refill   acetaminophen (TYLENOL) 500 MG tablet Take 1,000 mg by mouth every 6 (six) hours as needed for moderate pain.      albuterol (PROVENTIL) (2.5 MG/3ML) 0.083% nebulizer solution INHALE 3 ML BY NEBULIZATION EVERY 6 HOURS AS NEEDED FOR WHEEZING OR SHORTNESS OF BREATH 150  mL 1   albuterol (VENTOLIN HFA) 108 (90 Base) MCG/ACT inhaler TAKE 2 PUFFS BY MOUTH EVERY 6 HOURS AS NEEDED FOR WHEEZE OR SHORTNESS OF BREATH 8.5 each 6   amLODipine (NORVASC) 10 MG tablet Take 1 tablet (10 mg total) by mouth daily. 90 tablet 3   aspirin 325 MG tablet Take 325 mg by mouth daily.     benztropine (COGENTIN) 0.5 MG tablet Take 0.5 mg by mouth 2 (two) times daily.      Blood Pressure Monitoring (BLOOD PRESSURE CUFF) MISC Use daily as directed to check blood pressure 1 each 0   cyclobenzaprine (FLEXERIL) 10 MG tablet TAKE 1 TABLET BY MOUTH EVERYDAY AT BEDTIME 30 tablet 3   diclofenac Sodium (VOLTAREN) 1 % GEL Apply 2 g topically 4 (four) times daily as needed (pain). 150 g 1   esomeprazole (NEXIUM) 40 MG capsule TAKE 1 CAPSULE BY MOUTH EVERY DAY 90 capsule 3   fluPHENAZine (PROLIXIN) 10 MG tablet Take 2 tablets (20 mg total) by mouth daily. (Patient taking differently: Take 10 mg by mouth daily.) 60 tablet 0   fluticasone (FLONASE) 50 MCG/ACT nasal spray SPRAY 2 SPRAYS INTO EACH NOSTRIL EVERY DAY 48 mL 2   furosemide (LASIX) 20 MG tablet Take 1 tablet (20 mg total) by mouth daily as needed for edema. 30 tablet 0   gabapentin (NEURONTIN) 300 MG capsule Take 1 capsule (300 mg total) by mouth 2 (two) times daily. 180 capsule 3   KLOR-CON M20 20 MEQ tablet Take 1 tablet (20 mEq total) by mouth daily. 90 tablet 3   naproxen sodium (ALEVE)  220 MG tablet Take 440 mg by mouth daily as needed (pain).      sildenafil (VIAGRA) 100 MG tablet Take 100 mg by mouth daily as needed.     SYMBICORT 160-4.5 MCG/ACT inhaler INHALE 2 PUFFS INTO THE LUNGS IN THE MORNING AND AT BEDTIME. TAKE 2 PUFFS BY MOUTH TWICE A DAY 30.6 each 1   tamsulosin (FLOMAX) 0.4 MG CAPS capsule Take 0.4 mg by mouth daily.     valsartan (DIOVAN) 160 MG tablet Take 1 tablet (160 mg total) by mouth daily. 90 tablet 3   No current facility-administered medications for this visit.    Allergies: Celecoxib, Iohexol, Ivp dye  [iodinated contrast media], Metoclopramide hcl, Ritalin [methylphenidate], and Wellbutrin [bupropion]  Past Medical History:  Diagnosis Date   ALLERGIC RHINITIS 01/25/2007   ANXIETY DISORDER, GENERALIZED 01/25/2007   Arthritis    Chronic kidney disease    stage 3   COPD (chronic obstructive pulmonary disease) ([place])    DEPRESSION 01/25/2007   ESOPHAGITIS 12/28/2007   Fatty liver 10/09/2010   GERD 01/25/2007   GLUCOSE INTOLERANCE, HX OF 01/25/2007   Heart murmur    hx of    HEMORRHOIDS, INTERNAL, WITH BLEEDING 04/26/2008   Hiatal hernia    HYPERLIPIDEMIA 12/27/2007   HYPERTENSION, ESSENTIAL NOS 01/25/2007   Hyperthyroidism 08/11/2010   HYPERTROPHY PROSTATE W/UR OBST & OTH LUTS 02/27/2010   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 Zella Richer   COLONOSCOPY  04/23/2010   normal   ESOPHAGOGASTRODUODENOSCOPY N/A 12/31/2013   Procedure: ESOPHAGOGASTRODUODENOSCOPY (EGD);  Surgeon: Gatha Mayer, MD;  Location: [facility];  Service: Endoscopy;  Laterality: N/A;   Excision of scalp lesion  07/18/2009   Excision of thyroid mass     GANGLION CYST EXCISION     right foot x 2   HERNIA REPAIR     ventral   INCISIONAL HERNIA REPAIR  12/01/2011   Procedure: LAPAROSCOPIC INCISIONAL HERNIA;  Surgeon: Harl Bowie, MD;  Location: [facility];  Service: General;  Laterality: N/A;   SHOULDER SURGERY     right   UPPER GASTROINTESTINAL ENDOSCOPY      Family History  Problem Relation Age of Onset   Hypertension Mother    Bone cancer Mother    Hypertension Father    Kidney disease Father    Bone  cancer Brother    Kidney disease Brother    Prostate cancer Other    Colon cancer Neg Hx    Colon polyps Neg Hx    Esophageal cancer Neg Hx    Rectal cancer Neg Hx    Stomach cancer Neg Hx     Social History   Tobacco Use   Smoking status: Every Day    Packs/day: 1.50    Years: 49.00    Pack years: 73.50    Types: Cigarettes    Start date: 47   Smokeless tobacco: Never   Tobacco comments:    Started smoking at age 47,still smoking pack - 1-1/2 packs  per day 01/08/2021  Substance Use Topics   Alcohol use: No  Subjective:  Accompanied by wife; Concerned that finger has been "locking up"- not moving like it should; also mentions that at some point in the past, he was told that he had carpal tunnel syndrome but notes he was never referred for surgery.  He also mentions that he feels like he has been more off balance lately/ "feel like my legs are going out from me." Using cane for stability;      Objective:  Vitals:   05/21/21 1551  BP: 132/84  Pulse: (!) 108  Resp: 18  Temp: 98 F (36.7 C)  TempSrc: Oral  SpO2: 98%  Weight: 254 lb 12.8 oz (115.6 kg)  Height: $Remove'5\' 8"'[id number]$  (1.727 m)    General: Well developed, well nourished, in no acute distress  Skin : Warm and dry.  Head: Normocephalic and atraumatic  Lungs: Respirations unlabored; clear to auscultation bilaterally without wheeze, rales, rhonchi  CVS exam: normal rate and regular rhythm.  Abdomen: Soft; nontender; nondistended; normoactive bowel sounds; no masses or hepatosplenomegaly  Musculoskeletal: No deformities; no active joint inflammation  Extremities: No edema, cyanosis, clubbing  Vessels: Symmetric bilaterally  Neurologic: Alert and oriented; speech intact; face symmetrical; moves all extremities well; CNII-XII intact without focal deficit   Assessment:  1. Trigger middle finger of right hand   2. Bilateral carpal tunnel syndrome   3. Frequent falls   4. Balance problem     Plan:  Refer to  orthopedist; Refer to orthopedist; Refer to neurology; Update labs today; refer to neurology;   As patient was leaving office, he came back and noted that he is scheduled to have 10 teeth removed next week "under anesthesia." He wanted to make sure that his heart "sounded okay" to undergo the procedure. I explained to him that his heart was beating faster than normal today but did sound normal rhythm; however, I explained to him that me listening to his heart today does not constitute surgical clearance for dental procedure. He expresses understanding.  This visit occurred during the SARS-CoV-2 public health emergency.  Safety protocols were in place, including screening questions prior to the visit, additional usage of staff PPE, and extensive cleaning of exam room while observing appropriate contact time as indicated for disinfecting solutions.    No follow-ups on file.  Orders Placed This Encounter  Procedures   CBC with Differential/Platelet   Comp Met (CMET)   TSH   B12   Magnesium   Ambulatory referral to Orthopedic Surgery    Referral Priority:   Routine    Referral Type:   Surgical    Referral Reason:   Specialty Services Required    Requested Specialty:   Orthopedic Surgery    Number of Visits Requested:   1   Ambulatory referral to Neurology    Referral Priority:   Routine    Referral Type:   Consultation    Referral Reason:   Specialty Services Required    Requested Specialty:   Neurology    Number of Visits Requested:   1    Requested Prescriptions    No prescriptions requested or ordered in this encounter

## 2021-05-22 LAB — COMPREHENSIVE METABOLIC PANEL
ALT: 14 U/L (ref 0–53)
AST: 14 U/L (ref 0–37)
Albumin: 4.3 g/dL (ref 3.5–5.2)
Alkaline Phosphatase: 78 U/L (ref 39–117)
BUN: 7 mg/dL (ref 6–23)
CO2: 28 mEq/L (ref 19–32)
Calcium: 9.7 mg/dL (ref 8.4–10.5)
Chloride: 102 mEq/L (ref 96–112)
Creatinine, Ser: 1.25 mg/dL (ref 0.40–1.50)
GFR: 60.98 mL/min (ref >60.00)
Glucose, Bld: 96 mg/dL (ref 70–99)
Potassium: 4.1 mEq/L (ref 3.5–5.1)
Sodium: 138 mEq/L (ref 135–145)
Total Bilirubin: 0.4 mg/dL (ref 0.2–1.2)
Total Protein: 7.5 g/dL (ref 6.0–8.3)

## 2021-05-22 LAB — CBC WITH DIFFERENTIAL/PLATELET
Basophils Absolute: 0.1 10*3/uL (ref 0.0–0.1)
Basophils Relative: 1 % (ref 0.0–3.0)
Eosinophils Absolute: 0.2 10*3/uL (ref 0.0–0.7)
Eosinophils Relative: 2.4 % (ref 0.0–5.0)
HCT: 42.5 % (ref 39.0–52.0)
Hemoglobin: 14.3 g/dL (ref 13.0–17.0)
Lymphocytes Relative: 29.8 % (ref 12.0–46.0)
Lymphs Abs: 2.8 10*3/uL (ref 0.7–4.0)
MCHC: 33.6 g/dL (ref 30.0–36.0)
MCV: 87.2 fl (ref 78.0–100.0)
Monocytes Absolute: 0.7 10*3/uL (ref 0.1–1.0)
Monocytes Relative: 7.6 % (ref 3.0–12.0)
Neutro Abs: 5.6 10*3/uL (ref 1.4–7.7)
Neutrophils Relative %: 59.2 % (ref 43.0–77.0)
Platelets: 309 10*3/uL (ref 150.0–400.0)
RBC: 4.88 Mil/uL (ref 4.22–5.81)
RDW: 14.8 % (ref 11.5–15.5)
WBC: 9.4 10*3/uL (ref 4.0–10.5)

## 2021-05-22 LAB — VITAMIN B12: Vitamin B-12: 230 pg/mL (ref 211–911)

## 2021-05-22 LAB — TSH: TSH: 1.73 u[IU]/mL (ref 0.35–5.50)

## 2021-05-22 LAB — MAGNESIUM: Magnesium: 1.9 mg/dL (ref 1.5–2.5)

## 2021-05-26 DIAGNOSIS — M549 Dorsalgia, unspecified: Secondary | ICD-10-CM | POA: Diagnosis not present

## 2021-05-26 DIAGNOSIS — Z743 Need for continuous supervision: Secondary | ICD-10-CM | POA: Diagnosis not present

## 2021-05-27 ENCOUNTER — Other Ambulatory Visit: Payer: Self-pay

## 2021-05-27 ENCOUNTER — Encounter (HOSPITAL_COMMUNITY): Payer: Self-pay

## 2021-05-27 ENCOUNTER — Emergency Department (HOSPITAL_COMMUNITY)
Admission: EM | Admit: 2021-05-27 | Discharge: 2021-05-27 | Disposition: A | Discharge status: Home / Self Care | Payer: Medicare Other | Attending: Emergency Medicine | Admitting: Emergency Medicine

## 2021-05-27 DIAGNOSIS — G8929 Other chronic pain: Secondary | ICD-10-CM | POA: Insufficient documentation

## 2021-05-27 DIAGNOSIS — M545 Low back pain, unspecified: Secondary | ICD-10-CM | POA: Insufficient documentation

## 2021-05-27 DIAGNOSIS — Z5321 Procedure and treatment not carried out due to patient leaving prior to being seen by health care provider: Secondary | ICD-10-CM | POA: Diagnosis not present

## 2021-05-27 NOTE — ED Triage Notes (Signed)
Pt BIB EMS. Pt reports with chronic back pain that has gotten more intense over the past 2 days. Pt states that the pain radiates down his legs.

## 2021-06-02 ENCOUNTER — Other Ambulatory Visit: Payer: Self-pay

## 2021-06-02 ENCOUNTER — Ambulatory Visit (INDEPENDENT_AMBULATORY_CARE_PROVIDER_SITE_OTHER): Payer: Medicare Other | Admitting: Orthopedic Surgery

## 2021-06-02 ENCOUNTER — Encounter: Payer: Self-pay | Admitting: Orthopedic Surgery

## 2021-06-02 DIAGNOSIS — M65331 Trigger finger, right middle finger: Secondary | ICD-10-CM | POA: Diagnosis not present

## 2021-06-02 MED ORDER — LIDOCAINE HCL 1 % IJ SOLN
1.0000 mL | INTRAMUSCULAR | Status: AC | PRN
  Administered 2021-06-02: 1 mL

## 2021-06-02 MED ORDER — BETAMETHASONE SOD PHOS & ACET 6 (3-3) MG/ML IJ SUSP
6.0000 mg | INTRAMUSCULAR | Status: AC | PRN
  Administered 2021-06-02: 6 mg via INTRA_ARTICULAR

## 2021-06-02 NOTE — Progress Notes (Signed)
Office Visit Note   Patient: [name]           Date of Birth: [birth year]           MRN: [id number] Visit Date: 06/02/2021              Requested by: Marrian Salvage, [address] [place],  [place] [address] PCP: Marrian Salvage, FNP   Assessment & Plan: Visit Diagnoses:  1. Trigger finger, right middle finger     Plan: We discussed the diagnosis, prognosis, non-operative and operative treatment options for trigger finger.  After our discussion, the patient would like to proceed with corticosteroid injection.  We reviewed the risks and benefits of conservative management.  The patient expressed understanding of the reasoning and strategy going forward.  All patient questions and concerns were addressed.    Follow-Up Instructions: No follow-ups on file.   Orders:  No orders of the defined types were placed in this encounter.  No orders of the defined types were placed in this encounter.     Procedures: Hand/UE Inj: R long A1 for trigger finger on 06/02/2021 1:55 PM Indications: tendon swelling and therapeutic Details: 25 G needle, volar approach Medications: 1 mL lidocaine 1 %; 6 mg betamethasone acetate-betamethasone sodium phosphate 6 (3-3) MG/ML Outcome: tolerated well, no immediate complications Consent was given by the patient. Immediately prior to procedure a time out was called to verify the correct patient, procedure, equipment, support staff and site/side marked as required. Patient was prepped and draped in the usual sterile fashion.      Clinical Data: No additional findings.   Subjective: Chief Complaint  Patient presents with   Right Middle Finger - New Patient (Initial Visit)    Is a 66 year old right-hand-dominant male who presents with pain and triggering of the right middle finger for the last 3 to 4 weeks.  He denies any trauma to his hand.  He denies any triggering of any other digits.  He notes that he  will occasionally make a fist and have difficulty unlocking the middle finger.  He is able to unlock the finger without using the contralateral hand.  He denies any numbness or paresthesias in his hand.  He has no symptoms today.   Review of Systems   Objective: Vital Signs: BP 138/80 (BP Location: Right Arm, Patient Position: Sitting, Cuff Size: Large)    Pulse 100    Ht 5\' 9"  (1.753 m)    Wt 248 lb 12.8 oz (112.9 kg)    SpO2 95%    BMI 36.74 kg/m   Physical Exam Constitutional:      Appearance: Normal appearance.  Cardiovascular:     Rate and Rhythm: Normal rate.     Pulses: Normal pulses.  Pulmonary:     Effort: Pulmonary effort is normal.  Skin:    General: Skin is warm and dry.     Capillary Refill: Capillary refill takes less than 2 seconds.  Neurological:     Mental Status: He is alert.    Right Hand Exam   Tenderness  Right hand tenderness location: Mildly TTP at middle finger A1 pulley.  Other  Erythema: absent Sensation: normal Pulse: present  Comments:  Mild palpable triggering of middle finger.  No triggering of other digits.      Specialty Comments:  No specialty comments available.  Imaging: No results found.   PMFS History: Patient Active Problem List   Diagnosis Date Noted  Trigger finger, right middle finger 06/02/2021   Coronary artery calcification 08/15/2020   Nodule of apex of right lung 05/29/2020   Malignant neoplasm of prostate ([place]) 10/26/2019   Body mass index (BMI) 25.0-25.9, adult 10/25/2019   Spondylosis of cervical region without myelopathy or radiculopathy 09/26/2019   Numbness of hand 06/22/2019   Ulnar neuropathy at elbow of left upper extremity 06/05/2019   Chronic obstructive pulmonary disease ([place]) 10/24/2018   Cervical radiculopathy at C8 01/18/2018   Right lateral epicondylitis 12/21/2017   Pain in joint, shoulder region 41/74/0814   Periumbilical abdominal pain 02/13/2015   Elevated PSA 08/04/2014   Subacromial  bursitis 04/11/2014   Neck pain on right side 03/05/2014   Bilateral shoulder pain 03/05/2014   Recurrent boils 03/05/2014   Foreign body in stomach 12/31/2013   Reflux esophagitis 12/31/2013   Weight loss 05/06/2012   Incisional hernia 11/23/2011   Cramp of limb 01/13/2011   Hyperthyroidism 08/11/2010   Sebaceous cyst 08/10/2010   HYPERTROPHY PROSTATE W/UR OBST & OTH LUTS 02/27/2010   Tobacco abuse 08/05/2009   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   HEMORRHOIDS, INTERNAL, WITH BLEEDING 04/26/2008   Hyperlipidemia 12/27/2007   LOW BACK PAIN, CHRONIC 11/08/2007   Schizophrenia ([place]) 01/25/2007   ANXIETY DISORDER, GENERALIZED 01/25/2007   DEPRESSION 01/25/2007   Essential hypertension 01/25/2007   ALLERGIC RHINITIS 01/25/2007   SCOLIOSIS NEC 01/25/2007   Impaired glucose tolerance 01/25/2007   Past Medical History:  Diagnosis Date   ALLERGIC RHINITIS 01/25/2007   ANXIETY DISORDER, GENERALIZED 01/25/2007   Arthritis    Chronic kidney disease    stage 3   COPD (chronic obstructive pulmonary disease) ([place])    DEPRESSION 01/25/2007   ESOPHAGITIS 12/28/2007   Fatty liver 10/09/2010   GERD 01/25/2007   GLUCOSE INTOLERANCE, HX OF 01/25/2007   Heart murmur    hx of    HEMORRHOIDS, INTERNAL, WITH BLEEDING 04/26/2008   Hiatal hernia    HYPERLIPIDEMIA 12/27/2007   HYPERTENSION, ESSENTIAL NOS 01/25/2007   Hyperthyroidism 08/11/2010   HYPERTROPHY PROSTATE W/UR OBST & OTH LUTS 02/27/2010   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   LOW BACK PAIN, CHRONIC 11/08/2007   Pancreatitis    Prostate cancer ([facility])    Rectal abscess    SCHIZOPHRENIA NEC, CHRONIC 01/25/2007   SCOLIOSIS NEC 01/25/2007   Sebaceous cyst 08/10/2010   SMOKER 08/05/2009   UPPER GASTROINTESTINAL HEMORRHAGE 01/25/2007    Family History  Problem Relation Age of Onset   Hypertension Mother    Bone cancer Mother    Hypertension Father    Kidney disease Father    Bone cancer Brother    Kidney disease Brother     Prostate cancer Other    Colon cancer Neg Hx    Colon polyps Neg Hx    Esophageal cancer Neg Hx    Rectal cancer Neg Hx    Stomach cancer Neg Hx     Past Surgical History:  Procedure Laterality Date   ABDOMINAL EXPLORATION SURGERY  04/19/1986   CHOLECYSTECTOMY  05/20/2008   Dr. Zella Richer   COLONOSCOPY  04/23/2010   normal   ESOPHAGOGASTRODUODENOSCOPY N/A 12/31/2013   Procedure: ESOPHAGOGASTRODUODENOSCOPY (EGD);  Surgeon: Gatha Mayer, MD;  Location: [facility];  Service: Endoscopy;  Laterality: N/A;   Excision of scalp lesion  07/18/2009   Excision of thyroid mass     GANGLION CYST EXCISION     right foot x 2   HERNIA REPAIR     ventral   INCISIONAL HERNIA  REPAIR  12/01/2011   Procedure: LAPAROSCOPIC INCISIONAL HERNIA;  Surgeon: Harl Bowie, MD;  Location: [facility];  Service: General;  Laterality: N/A;   SHOULDER SURGERY     right   UPPER GASTROINTESTINAL ENDOSCOPY     Social History   Occupational History   Occupation: Disabled    Employer: DISABLED  Tobacco Use   Smoking status: Every Day    Packs/day: 1.50    Years: 49.00    Pack years: 73.50    Types: Cigarettes    Start date: 1971   Smokeless tobacco: Never   Tobacco comments:    Started smoking at age 62,still smoking pack - 1-1/2 packs  per day 01/08/2021  Vaping Use   Vaping Use: Never used  Substance and Sexual Activity   Alcohol use: No   Drug use: Yes    Frequency: 14.0 times per week    Types: Marijuana    Comment: 01/19/2021   Sexual activity: Yes    Partners: Female

## 2021-06-08 ENCOUNTER — Encounter: Payer: Self-pay | Admitting: Psychiatry

## 2021-06-08 ENCOUNTER — Other Ambulatory Visit: Payer: Self-pay

## 2021-06-08 ENCOUNTER — Ambulatory Visit (INDEPENDENT_AMBULATORY_CARE_PROVIDER_SITE_OTHER): Payer: Medicare Other | Admitting: Psychiatry

## 2021-06-08 VITALS — BP 176/94 | HR 105 | Temp 97.8°F | Wt 253.2 lb

## 2021-06-08 DIAGNOSIS — F209 Schizophrenia, unspecified: Secondary | ICD-10-CM

## 2021-06-08 DIAGNOSIS — F17200 Nicotine dependence, unspecified, uncomplicated: Secondary | ICD-10-CM | POA: Diagnosis not present

## 2021-06-08 DIAGNOSIS — F419 Anxiety disorder, unspecified: Secondary | ICD-10-CM

## 2021-06-08 DIAGNOSIS — I10 Essential (primary) hypertension: Secondary | ICD-10-CM | POA: Diagnosis not present

## 2021-06-08 MED ORDER — FLUPHENAZINE HCL 10 MG PO TABS: 10.0000 mg | ORAL_TABLET | Freq: Every day | ORAL | 1 refills | Status: DC

## 2021-06-08 MED ORDER — HYDROXYZINE HCL 25 MG PO TABS: 12.5000 mg | ORAL_TABLET | Freq: Every day | ORAL | 2 refills | Status: DC | PRN

## 2021-06-08 MED ORDER — BENZTROPINE MESYLATE 0.5 MG PO TABS: 0.5000 mg | ORAL_TABLET | Freq: Two times a day (BID) | ORAL | 0 refills | Status: DC

## 2021-06-08 NOTE — Patient Instructions (Signed)
Hydroxyzine Capsules or Tablets What is this medication? HYDROXYZINE (hye [place] i zeen) treats the symptoms of allergies and allergic reactions. It may also be used to treat anxiety or cause drowsiness before a procedure. It works by blocking histamine, a substance released by the body during an allergic reaction. It belongs to a group of medications called antihistamines. This medicine may be used for other purposes; ask your health care provider or pharmacist if you have questions. COMMON BRAND NAME(S): ANX, Atarax, Rezine, Vistaril What should I tell my care team before I take this medication? They need to know if you have any of these conditions: Glaucoma Heart disease History of irregular heartbeat Kidney disease Liver disease Lung or breathing disease, like asthma Stomach or intestine problems Thyroid disease Trouble passing urine An unusual or allergic reaction to hydroxyzine, cetirizine, other medications, foods, dyes or preservatives Pregnant or trying to get pregnant Breast-feeding How should I use this medication? Take this medication by mouth with a full glass of water. Follow the directions on the prescription label. You may take this medication with food or on an empty stomach. Take your medication at regular intervals. Do not take your medication more often than directed. Talk to your care team regarding the use of this medication in children. Special care may be needed. While this medication may be prescribed for children as young as 1 years of age for selected conditions, precautions do apply. Patients over 15 years old may have a stronger reaction and need a smaller dose. Overdosage: If you think you have taken too much of this medicine contact a poison control center or emergency room at once. NOTE: This medicine is only for you. Do not share this medicine with others. What if I miss a dose? If you miss a dose, take it as soon as you can. If it is almost time for your next  dose, take only that dose. Do not take double or extra doses. What may interact with this medication? Do not take this medication with any of the following: Cisapride Dronedarone Pimozide Thioridazine This medication may also interact with the following: Alcohol Antihistamines for allergy, cough, and cold Atropine Barbiturate medications for sleep or seizures, like phenobarbital Certain antibiotics like erythromycin or clarithromycin Certain medications for anxiety or sleep Certain medications for bladder problems like oxybutynin, tolterodine Certain medications for depression or psychotic disturbances Certain medications for irregular heart beat Certain medications for Parkinson's disease like benztropine, trihexyphenidyl Certain medications for seizures like phenobarbital, primidone Certain medications for stomach problems like dicyclomine, hyoscyamine Certain medications for travel sickness like scopolamine Ipratropium Narcotic medications for pain Other medications that prolong the QT interval (which can cause an abnormal heart rhythm) like dofetilide This list may not describe all possible interactions. Give your health care provider a list of all the medicines, herbs, non-prescription drugs, or dietary supplements you use. Also tell them if you smoke, drink alcohol, or use illegal drugs. Some items may interact with your medicine. What should I watch for while using this medication? Tell your care team if your symptoms do not improve. You may get drowsy or dizzy. Do not drive, use machinery, or do anything that needs mental alertness until you know how this medication affects you. Do not stand or sit up quickly, especially if you are an older patient. This reduces the risk of dizzy or fainting spells. Alcohol may interfere with the effect of this medication. Avoid alcoholic drinks. Your mouth may get dry. Chewing sugarless gum or sucking hard  candy, and drinking plenty of water may  help. Contact your care team if the problem does not go away or is severe. This medication may cause dry eyes and blurred vision. If you wear contact lenses you may feel some discomfort. Lubricating drops may help. See your eye care specialist if the problem does not go away or is severe. If you are receiving skin tests for allergies, tell your care team you are using this medication. What side effects may I notice from receiving this medication? Side effects that you should report to your care team as soon as possible: Allergic reactions--skin rash, itching, hives, swelling of the face, lips, tongue, or throat Heart rhythm changes--fast or irregular heartbeat, dizziness, feeling faint or lightheaded, chest pain, trouble breathing Side effects that usually do not require medical attention (report to your care team if they continue or are bothersome): Confusion Drowsiness Dry mouth Hallucinations Headache This list may not describe all possible side effects. Call your doctor for medical advice about side effects. You may report side effects to FDA at 1-800-FDA-1088. Where should I keep my medication? Keep out of the reach of children and pets. Store at room temperature between 15 and 30 degrees C (59 and 86 degrees F). Keep container tightly closed. Throw away any unused medication after the expiration date. NOTE: This sheet is a summary. It may not cover all possible information. If you have questions about this medicine, talk to your doctor, pharmacist, or health care provider.  2022 [employer]/Gold Standard (2020-06-18 00:00:00)

## 2021-06-08 NOTE — Progress Notes (Signed)
Psychiatric Initial Adult Assessment   Patient Identification: [name] MRN:  [id number] Date of Evaluation:  06/08/2021 Referral Source: Ms.Laura Scheryl Darter FNP Chief Complaint:   Chief Complaint  Patient presents with   Establish Care: 66 year old African-American male, married, with history of schizophrenia, multiple medical problems, presented to establish care for medication management.   Visit Diagnosis:    ICD-10-CM   1. Schizophrenia, unspecified type ([place])  F20.9 fluPHENAZine (PROLIXIN) 10 MG tablet    benztropine (COGENTIN) 0.5 MG tablet    2. Anxiety disorder, unspecified type  F41.9 hydrOXYzine (ATARAX) 25 MG tablet    3. Tobacco use disorder  F17.200     4. Elevated blood pressure reading in office with diagnosis of hypertension  I10       History of Present Illness:  [name] is a 66 year old African-American male, on disability, lives in [place] with his wife, has a history of schizophrenia, essential hypertension, coronary artery disease, prostate cancer, chronic pain, history of multiple surgeries, presented to establish care.  Patient appeared to be pleasant, cooperative.  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  Patient reports he was diagnosed with schizophrenia and was placed on medications.  Patient has had trials of multiple medications in the past and did fairly well on Navane for a while.  Currently on Prolixin, 10 mg p.o. daily.  Patient reports in the past when he was noncompliant on his medications he has had several inpatient mental health admissions.  Currently compliant, takes it religiously.  Hence has been staying stable.  Last hospitalization was 10 years ago.  Does have some anxiety symptoms on and off, struggles with pain, arthralgia which also makes him anxious.  Reports he smokes cannabis off and on last use was 2 to 3 weeks ago.  Believes it relaxes him.   Agreeable to adding low-dose hydroxyzine as needed for his anxiety.  Does report a previous history of auditory hallucinations-violent voices, his own.  Currently denies it.  Denies any other perceptual disturbances.  Denies any symptoms of depression.  Reports sleep is good.  Denies any suicidality or homicidality.  Per collateral information from wife patient is currently stable.  He does have multiple medical problems including a diagnosis of prostate cancer.  Patient is currently under the care of his providers for the same.     Associated Signs/Symptoms: Depression Symptoms:  Denies (Hypo) Manic Symptoms:   Denies Anxiety Symptoms:   unspecified Psychotic Symptoms:   History of hallucinations PTSD Symptoms: Negative  Past Psychiatric History: Patient reports a previous diagnosis of schizophrenia.  Does report multiple inpatient mental health admissions in the past.  [facility]  Reports they are no longer in practice and hence decided to transfer his care.  Patient also reports being under the care of [facility] as well as [facility] in the past.  Denies suicide attempts.  Does report multiple inpatient mental health admissions-[place], [place], [place].  Most recent was 10 years ago.  Previous Psychotropic Medications: Yes Navane, Haldol, Prolixin  Substance Abuse History in the last 12 months:  Yes.  Does report using cannabis on and off.  Did not elaborate further.  Last use was 3 weeks ago.  Reports it helps with pain.  Consequences of Substance Abuse: Negative  Past Medical History:  Past Medical History:  Diagnosis Date  ALLERGIC RHINITIS 01/25/2007   ANXIETY DISORDER, GENERALIZED 01/25/2007   Arthritis    Chronic kidney disease    stage 3   COPD (chronic obstructive pulmonary disease) ([place])    DEPRESSION 01/25/2007   ESOPHAGITIS 12/28/2007   Fatty liver 10/09/2010   GERD  01/25/2007   GLUCOSE INTOLERANCE, HX OF 01/25/2007   Heart murmur    hx of    HEMORRHOIDS, INTERNAL, WITH BLEEDING 04/26/2008   Hiatal hernia    HYPERLIPIDEMIA 12/27/2007   HYPERTENSION, ESSENTIAL NOS 01/25/2007   Hyperthyroidism 08/11/2010   HYPERTROPHY PROSTATE W/UR OBST & OTH LUTS 02/27/2010   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 Zella Richer   COLONOSCOPY  04/23/2010   normal   ESOPHAGOGASTRODUODENOSCOPY N/A 12/31/2013   Procedure: ESOPHAGOGASTRODUODENOSCOPY (EGD);  Surgeon: Gatha Mayer, MD;  Location: [facility];  Service: Endoscopy;  Laterality: N/A;   Excision of scalp lesion  07/18/2009   Excision of thyroid mass     GANGLION CYST EXCISION     right foot x 2   HERNIA REPAIR     ventral   INCISIONAL HERNIA REPAIR  12/01/2011   Procedure: LAPAROSCOPIC INCISIONAL HERNIA;  Surgeon: Harl Bowie, MD;  Location: [facility];  Service: General;  Laterality: N/A;   SHOULDER SURGERY     right   UPPER GASTROINTESTINAL ENDOSCOPY      Family Psychiatric History: As noted below.  Son-schizophrenia.  Family History:  Family History  Problem Relation Age of Onset   Hypertension Mother    Bone cancer Mother    Hypertension Father    Kidney disease Father    Bone cancer Brother    Kidney disease Brother    Mental illness Maternal Grandmother    Prostate cancer Other    Mental illness Son    Colon cancer Neg Hx    Colon polyps Neg Hx    Esophageal cancer Neg Hx    Rectal cancer Neg Hx    Stomach cancer Neg Hx     Social History:   Social History   Socioeconomic History    Marital status: Married    Spouse name: [name]   Number of children: 2   Years of education: Not on file   Highest education level: Not on file  Occupational History   Occupation: Disabled    Employer: DISABLED  Tobacco Use   Smoking status: Every Day    Packs/day: 1.50    Years: 49.00    Pack years: 73.50    Types: Cigarettes    Start date: 1971   Smokeless tobacco: Never   Tobacco comments:    Started smoking at age 70,still smoking pack - 1-1/2 packs  per day 01/08/2021  Vaping Use   Vaping Use: Never used  Substance and Sexual Activity   Alcohol use: No   Drug use: Yes    Frequency: 1.0 times per week    Types: Marijuana    Comment: 01/19/2021   Sexual activity: Yes    Partners: Female  Other Topics Concern   Not on file  Social History Narrative  HSG disabled due to schizophrenia - 1983. stable long-term marriage with children. cared for his father-in-law- passed away in 07/22/22   Social Determinants of Health   Financial Resource Strain: Not on file  Food Insecurity: Not on file  Transportation Needs: Not on file  Physical Activity: Not on file  Stress: Not on file  Social Connections: Not on file    Additional Social History: Patient was born in [place].  Patient reports he had a good childhood.  His parents were married.  Patient has a brother and a sister.  Patient graduated 12th grade.  Patient is currently disabled.  Patient has been married to his wife since the past 2 years.  Patient has a 59 year old daughter and a 11 year old son.  Patient currently lives in [place].  Allergies:   Allergies  Allergen Reactions   Celecoxib Other (See Comments)     rectal bleeding   Iohexol Other (See Comments)    Affects nerves: triggers schizophrenia   Ivp Dye [Iodinated Contrast Media]    Metoclopramide Hcl Other (See Comments)    Affects nerves: triggers schizophrenia   Ritalin [Methylphenidate]    Wellbutrin [Bupropion] Other (See Comments)    Makes pt smoke  heavily    Metabolic Disorder Labs: Lab Results  Component Value Date   HGBA1C 5.7 08/01/2014   No results found for: PROLACTIN Lab Results  Component Value Date   CHOL 170 10/24/2018   TRIG 148.0 10/24/2018   HDL 41.30 10/24/2018   CHOLHDL 4 10/24/2018   VLDL 29.6 10/24/2018   LDLCALC 99 10/24/2018   LDLCALC 110 (H) 10/19/2017   Lab Results  Component Value Date   TSH 1.73 05/21/2021    Therapeutic Level Labs: No results found for: LITHIUM No results found for: CBMZ No results found for: VALPROATE  Current Medications: Current Outpatient Medications  Medication Sig Dispense Refill   acetaminophen (TYLENOL) 500 MG tablet Take 1,000 mg by mouth every 6 (six) hours as needed for moderate pain.      albuterol (PROVENTIL) (2.5 MG/3ML) 0.083% nebulizer solution INHALE 3 ML BY NEBULIZATION EVERY 6 HOURS AS NEEDED FOR WHEEZING OR SHORTNESS OF BREATH 150 mL 1   albuterol (VENTOLIN HFA) 108 (90 Base) MCG/ACT inhaler TAKE 2 PUFFS BY MOUTH EVERY 6 HOURS AS NEEDED FOR WHEEZE OR SHORTNESS OF BREATH 8.5 each 6   amLODipine (NORVASC) 10 MG tablet Take 1 tablet (10 mg total) by mouth daily. 90 tablet 3   aspirin 325 MG tablet Take 325 mg by mouth daily.     Blood Pressure Monitoring (BLOOD PRESSURE CUFF) MISC Use daily as directed to check blood pressure 1 each 0   cyclobenzaprine (FLEXERIL) 10 MG tablet TAKE 1 TABLET BY MOUTH EVERYDAY AT BEDTIME 30 tablet 3   diclofenac Sodium (VOLTAREN) 1 % GEL Apply 2 g topically 4 (four) times daily as needed (pain). 150 g 1   esomeprazole (NEXIUM) 40 MG capsule TAKE 1 CAPSULE BY MOUTH EVERY DAY 90 capsule 3   fluticasone (FLONASE) 50 MCG/ACT nasal spray SPRAY 2 SPRAYS INTO EACH NOSTRIL EVERY DAY 48 mL 2   furosemide (LASIX) 20 MG tablet Take 1 tablet (20 mg total) by mouth daily as needed for edema. 30 tablet 0   gabapentin (NEURONTIN) 300 MG capsule Take 1 capsule (300 mg total) by mouth 2 (two) times daily. 180 capsule 3   hydrOXYzine (ATARAX) 25  MG tablet Take 0.5-1 tablets (12.5-25 mg total) by mouth daily as needed for anxiety. Please limit use 30  tablet 2   KLOR-CON M20 20 MEQ tablet Take 1 tablet (20 mEq total) by mouth daily. 90 tablet 3   naproxen sodium (ALEVE) 220 MG tablet Take 440 mg by mouth daily as needed (pain).      sildenafil (VIAGRA) 100 MG tablet Take 100 mg by mouth daily as needed.     SYMBICORT 160-4.5 MCG/ACT inhaler INHALE 2 PUFFS INTO THE LUNGS IN THE MORNING AND AT BEDTIME. TAKE 2 PUFFS BY MOUTH TWICE A DAY 30.6 each 1   tamsulosin (FLOMAX) 0.4 MG CAPS capsule Take 0.4 mg by mouth daily.     valsartan (DIOVAN) 160 MG tablet Take 1 tablet (160 mg total) by mouth daily. 90 tablet 3   benztropine (COGENTIN) 0.5 MG tablet Take 1 tablet (0.5 mg total) by mouth 2 (two) times daily. As needed for side effects of Prolixin 180 tablet 0   fluPHENAZine (PROLIXIN) 10 MG tablet Take 1 tablet (10 mg total) by mouth daily. 90 tablet 1   No current facility-administered medications for this visit.    Musculoskeletal: Strength & Muscle Tone: within normal limits Gait & Station: normal Patient leans: N/A  Psychiatric Specialty Exam: Review of Systems  Musculoskeletal:  Positive for arthralgias, back pain and neck pain.  Psychiatric/Behavioral:  The patient is nervous/anxious.   All other systems reviewed and are negative.  Blood pressure (!) 176/94, pulse (!) 105, temperature 97.8 F (36.6 C), temperature source Temporal, weight 253 lb 3.2 oz (114.9 kg).Body mass index is 37.39 kg/m.  General Appearance: Casual  Eye Contact:  Fair  Speech:  Clear and Coherent  Volume:  Normal  Mood:  Anxious  Affect:  Congruent  Thought Process:  Goal Directed and Descriptions of Associations: Intact  Orientation:  Full (Time, Place, and Person)  Thought Content:  Logical  Suicidal Thoughts:  No  Homicidal Thoughts:  No  Memory:  Immediate;   Fair Recent;   Fair Remote;   Fair  Judgement:  Fair  Insight:  Fair  Psychomotor  Activity:  Normal  Concentration:  Concentration: Fair and Attention Span: Fair  Recall:  [employer] of Knowledge:Fair  Language: Fair  Akathisia:  No  Handed:  Right  AIMS (if indicated):  done,0  Assets:  Communication Skills Desire for Improvement Housing Intimacy Social Support  ADL's:  Intact  Cognition: WNL  Sleep:  Fair   Screenings: [place] Office Visit from 06/08/2021 in [facility] Total Score 0      GAD-7    [place] Visit from 06/08/2021 in [facility]  Total GAD-7 Score 0      PHQ2-9    [place] Visit from 06/08/2021 in [facility] Office Visit from 11/20/2020 in [facility] [place] Visit from 08/22/2020 in [facility] [place]  PHQ-2 Total Score 0 0 0  PHQ-9 Total Score 0 -- --      [facility] ED from 05/27/2021 in [facility] DEPT ED from 11/16/2020 in [facility] No Risk No Risk       Assessment and Plan: Cordel Drewes is a 65 year old African-American male on disability, history of schizophrenia, hypertension, coronary artery disease, prostate cancer, chronic pain, married, lives in [place], presented to establish care.  Patient is currently stable on the Prolixin.  Discussed plan as noted  below.  Plan Schizophrenia-stable Continue Prolixin 10 mg p.o. daily Benztropine 0.5 mg p.o. twice daily as needed   Anxiety disorder unspecified-unstable Patient is not interested in psychotherapy when this was recommended. We will start hydroxyzine 12.5-25 mg p.o. daily as needed for anxiety attacks  Tobacco use disorder-unstable Provided counseling for 2 minutes. We will continue to ask advice assess in future sessions.  Elevated blood pressure reading-patient with  elevated blood pressure reading in session today.  Patient reports he did not take his antihypertensive medication prior to coming into the office.  Patient encouraged to stay compliant on his medication and monitor his blood pressure at home. We will continue to monitor his blood pressure in session.    Collateral information obtained from wife-Diane as noted above.  Patient advised to sign an ROI to obtain medical records from his previous psychiatrist.  Reviewed labs-discussed low vitamin B12-dated 05/21/2021-patient to follow up with primary care provider.  May benefit from replacement Reviewed and discussed TSH-dated 05/21/2021-within normal limits. Reviewed and discussed CMP-within normal limits CBC with differential-within normal limits.    Collaboration of Care: Patient refused AEB discussed referral to a therapist-patient is not interested.  Patient/Guardian was advised Release of Information must be obtained prior to any record release in order to collaborate their care with an outside provider. Patient/Guardian was advised if they have not already done so to contact the registration department to sign all necessary forms in order for [place] to release information regarding their care.   Consent: Patient/Guardian gives verbal consent for treatment and assignment of benefits for services provided during this visit. Patient/Guardian expressed understanding and agreed to proceed.   This note was generated in part or whole with voice recognition software. Voice recognition is usually quite accurate but there are transcription errors that can and very often do occur. I apologize for any typographical errors that were not detected and corrected.     Ursula Alert, MD 2/20/20233:37 PM

## 2021-06-30 ENCOUNTER — Other Ambulatory Visit: Payer: Self-pay | Admitting: Psychiatry

## 2021-06-30 DIAGNOSIS — F419 Anxiety disorder, unspecified: Secondary | ICD-10-CM

## 2021-07-13 DIAGNOSIS — N1831 Chronic kidney disease, stage 3a: Secondary | ICD-10-CM | POA: Diagnosis not present

## 2021-07-14 ENCOUNTER — Telehealth: Payer: Self-pay | Admitting: Neurology

## 2021-07-14 ENCOUNTER — Encounter: Payer: Self-pay | Admitting: Neurology

## 2021-07-14 ENCOUNTER — Ambulatory Visit (INDEPENDENT_AMBULATORY_CARE_PROVIDER_SITE_OTHER): Payer: Medicare Other | Admitting: Neurology

## 2021-07-14 VITALS — BP 132/82 | HR 102 | Ht 69.0 in | Wt 250.0 lb

## 2021-07-14 DIAGNOSIS — M5442 Lumbago with sciatica, left side: Secondary | ICD-10-CM

## 2021-07-14 DIAGNOSIS — M5441 Lumbago with sciatica, right side: Secondary | ICD-10-CM | POA: Diagnosis not present

## 2021-07-14 DIAGNOSIS — M542 Cervicalgia: Secondary | ICD-10-CM | POA: Diagnosis not present

## 2021-07-14 DIAGNOSIS — R29898 Other symptoms and signs involving the musculoskeletal system: Secondary | ICD-10-CM | POA: Insufficient documentation

## 2021-07-14 DIAGNOSIS — R269 Unspecified abnormalities of gait and mobility: Secondary | ICD-10-CM | POA: Diagnosis not present

## 2021-07-14 DIAGNOSIS — G8929 Other chronic pain: Secondary | ICD-10-CM

## 2021-07-14 NOTE — Progress Notes (Signed)
? ?Chief Complaint  ?Patient presents with  ? New Patient (Initial Visit)  ?  Rm 13. Accompanied by wife, [name]. ?NP internal referral for Frequent falls, balance problem. ?C/o pain in back and hip. States he can walk approx half a block, then legs feel like rubber. C/o lack of sensation in bottom of feet.  ? ? ? ? ?ASSESSMENT AND PLAN ? ?Chino Ceci is a 66 y.o. male   ?Gait abnormality, ?Worsening chronic low back pain radiating pain to right lower extremity ?Worsening neck pain, left hand numbness, weakness ? He has mild left hand finger abduction weakness, brisk bilateral patellar reflex, absent ankle reflex. ? Differentiation diagnosis include lumbar radiculopathy, spinal stenosis with superimposed cervical spondylitic myelopathy ? MRI of lumbar, cervical spine ? ? ?DIAGNOSTIC DATA (LABS, IMAGING, TESTING) ?- I reviewed patient records, labs, notes, testing and imaging myself where available. ? ? ?MEDICAL HISTORY: ? ?[name], is a 66 year old male, seen in request by his primary care nurse practitioner   Marrian Salvage, for evaluation of gait abnormality, worsening low back and neck pain, initial evaluation was on July 14, 2021. ? ?I reviewed and summarized the referring note. PMHX ?HTN ?GERD ?COPD, smoke 1/2 ppd ?Schizophrenia ?Obesity ?Multiple abdominal surgery for hernia ?Prostate Cancer,  ?Hx of left ulnar decompression ? 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 ?He fell 3 months ago, landed on his right hip, since then, he had worsening low back pain, radiating pain to right hip, lower extremity, he used to be able to walk blocks in his neighborhood, now off to half block, he will get short winded, bilateral lower extremity weakness, could not continue, ? ?He also had a history of chronic neck pain, radiating  pain to bilateral shoulder, recent worsening radiating pain to right shoulder, he had a history of left ulnar decompression few years back, without resolving his left fourth and fifth finger paresthesia, worsening left hand muscle weakness ? ?He had a history of prostate cancer, postsurgical infection per patient, since then, he had frequent urination, urgency, denies bowel incontinence ? ?X-ray of pelvic and right hip showed mild degenerative changes without acute abnormality ? ?Personally reviewed x-ray of lumbar November 2022, mild degenerative changes ?Laboratory evaluation in 2023, normal CMP CBC, B12 TSH ? ? ?PHYSICAL EXAM: ?  ?Vitals:  ? 07/14/21 1029  ?BP: 132/82  ?Pulse: (!) 102  ?Weight: 250 lb (113.4 kg)  ?Height: '5\' 9"'$  (1.753 m)  ? ?Not recorded ?  ? ? ?Body mass index is 36.92 kg/m?. ? ?PHYSICAL EXAMNIATION: ? ?Gen: NAD, conversant, well nourised, well groomed                     ?Cardiovascular: Regular rate rhythm, no peripheral edema, warm, nontender. ?Eyes: Conjunctivae clear without exudates or hemorrhage ?Neck: Supple, no carotid bruits. ?Pulmonary: Clear to auscultation bilaterally  ? ?NEUROLOGICAL EXAM: ? ?MENTAL STATUS: Obese,  ?Speech/cognition: ?  mildly slurred endentured speech, oriented to history taking and casual conversation ?  ?CRANIAL NERVES: ?CN II: Visual fields are full to confrontation. Pupils are round equal and briskly reactive to light. ?CN III, IV, VI: extraocular movement are normal. No ptosis. ?CN V: Facial sensation is intact to light touch ?CN VII: Face is symmetric with normal eye closure  ?CN VIII: Hearing is normal to causal conversation. ?  CN IX, X: Phonation is normal. ?CN XI: Head turning and shoulder shrug are intact ? ?MOTOR: Mild left hand intrinsic muscle atrophy, mild finger abduction, left fourth and fifth finger flexion weakness, well-healed left medial forearm scar. ? ?REFLEXES: ?Reflexes are symmetric at the 1/1 biceps, 2/2 triceps, 3/3 knees, and absent at  ankles. Plantar responses are flexor. ? ?SENSORY: ?Intact to light touch, pinprick and vibratory sensation are intact in fingers and toes. ? ?COORDINATION: ?There is no trunk or limb dysmetria noted. ? ?GAIT/STANCE: He needs push-up to get up from seated position, mildly antalgic, ? ?REVIEW OF SYSTEMS:  ?Full 14 system review of systems performed and notable only for as above ?All other review of systems were negative. ? ? ?ALLERGIES: ?Allergies  ?Allergen Reactions  ? Celecoxib Other (See Comments)  ?   rectal bleeding  ? Iohexol Other (See Comments)  ?  Affects nerves: triggers schizophrenia  ? Ivp Dye [Iodinated Contrast Media]   ? Metoclopramide Hcl Other (See Comments)  ?  Affects nerves: triggers schizophrenia  ? Ritalin [Methylphenidate]   ? Wellbutrin [Bupropion] Other (See Comments)  ?  Makes pt smoke heavily  ? ? ?HOME MEDICATIONS: ?Current Outpatient Medications  ?Medication Sig Dispense Refill  ? acetaminophen (TYLENOL) 500 MG tablet Take 1,000 mg by mouth every 6 (six) hours as needed for moderate pain.     ? albuterol (PROVENTIL) (2.5 MG/3ML) 0.083% nebulizer solution INHALE 3 ML BY NEBULIZATION EVERY 6 HOURS AS NEEDED FOR WHEEZING OR SHORTNESS OF BREATH 150 mL 1  ? albuterol (VENTOLIN HFA) 108 (90 Base) MCG/ACT inhaler TAKE 2 PUFFS BY MOUTH EVERY 6 HOURS AS NEEDED FOR WHEEZE OR SHORTNESS OF BREATH 8.5 each 6  ? amLODipine (NORVASC) 10 MG tablet Take 1 tablet (10 mg total) by mouth daily. 90 tablet 3  ? aspirin 325 MG tablet Take 325 mg by mouth daily.    ? benztropine (COGENTIN) 0.5 MG tablet Take 1 tablet (0.5 mg total) by mouth 2 (two) times daily. As needed for side effects of Prolixin 180 tablet 0  ? Blood Pressure Monitoring (BLOOD PRESSURE CUFF) MISC Use daily as directed to check blood pressure 1 each 0  ? cyclobenzaprine (FLEXERIL) 10 MG tablet TAKE 1 TABLET BY MOUTH EVERYDAY AT BEDTIME 30 tablet 3  ? diclofenac Sodium (VOLTAREN) 1 % GEL Apply 2 g topically 4 (four) times daily as needed  (pain). 150 g 1  ? esomeprazole (NEXIUM) 40 MG capsule TAKE 1 CAPSULE BY MOUTH EVERY DAY 90 capsule 3  ? fluPHENAZine (PROLIXIN) 10 MG tablet Take 1 tablet (10 mg total) by mouth daily. 90 tablet 1  ? fluticasone (FLONASE) 50 MCG/ACT nasal spray SPRAY 2 SPRAYS INTO EACH NOSTRIL EVERY DAY 48 mL 2  ? furosemide (LASIX) 20 MG tablet Take 1 tablet (20 mg total) by mouth daily as needed for edema. 30 tablet 0  ? gabapentin (NEURONTIN) 300 MG capsule Take 1 capsule (300 mg total) by mouth 2 (two) times daily. 180 capsule 3  ? hydrOXYzine (ATARAX) 25 MG tablet TAKE 0.5-1 TABLETS (12.5-25 MG TOTAL) BY MOUTH DAILY AS NEEDED FOR ANXIETY. PLEASE LIMIT USE 90 tablet 0  ? KLOR-CON M20 20 MEQ tablet Take 1 tablet (20 mEq total) by mouth daily. 90 tablet 3  ? naproxen sodium (ALEVE) 220 MG tablet Take 440 mg by mouth daily as needed (pain).     ? sildenafil (VIAGRA) 100 MG tablet Take 100 mg by mouth daily as needed.    ? SYMBICORT 160-4.5  MCG/ACT inhaler INHALE 2 PUFFS INTO THE LUNGS IN THE MORNING AND AT BEDTIME. TAKE 2 PUFFS BY MOUTH TWICE A DAY 30.6 each 1  ? tamsulosin (FLOMAX) 0.4 MG CAPS capsule Take 0.4 mg by mouth daily.    ? valsartan (DIOVAN) 160 MG tablet Take 1 tablet (160 mg total) by mouth daily. 90 tablet 3  ? ?No current facility-administered medications for this visit.  ? ? ?PAST MEDICAL HISTORY: ?Past Medical History:  ?Diagnosis Date  ? ALLERGIC RHINITIS 01/25/2007  ? ANXIETY DISORDER, GENERALIZED 01/25/2007  ? Arthritis   ? Chronic kidney disease   ? stage 3  ? COPD (chronic obstructive pulmonary disease) ([place])   ? DEPRESSION 01/25/2007  ? ESOPHAGITIS 12/28/2007  ? Fatty liver 10/09/2010  ? GERD 01/25/2007  ? GLUCOSE INTOLERANCE, HX OF 01/25/2007  ? Heart murmur   ? hx of   ? HEMORRHOIDS, INTERNAL, WITH BLEEDING 04/26/2008  ? Hiatal hernia   ? HYPERLIPIDEMIA 12/27/2007  ? HYPERTENSION, ESSENTIAL NOS 01/25/2007  ? Hyperthyroidism 08/11/2010  ? HYPERTROPHY PROSTATE W/UR OBST & OTH LUTS 02/27/2010  ? Impotence of  organic origin 08/05/2009  ? LOW BACK PAIN, CHRONIC 11/08/2007  ? Pancreatitis   ? Prostate cancer ([place])   ? Rectal abscess   ? 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 SCOLIOSIS NEC 01/25/2007  ? Sebaceous

## 2021-07-14 NOTE — Telephone Encounter (Signed)
[employer] medicare order sent to [facility], [facility] they will reach out to the patient to schedule.  

## 2021-07-22 ENCOUNTER — Other Ambulatory Visit: Payer: Self-pay | Admitting: Pulmonary Disease

## 2021-07-23 ENCOUNTER — Telehealth: Payer: Self-pay | Admitting: Family

## 2021-07-23 DIAGNOSIS — I129 Hypertensive chronic kidney disease with stage 1 through stage 4 chronic kidney disease, or unspecified chronic kidney disease: Secondary | ICD-10-CM | POA: Diagnosis not present

## 2021-07-23 DIAGNOSIS — N1831 Chronic kidney disease, stage 3a: Secondary | ICD-10-CM | POA: Diagnosis not present

## 2021-07-23 DIAGNOSIS — E559 Vitamin D deficiency, unspecified: Secondary | ICD-10-CM | POA: Diagnosis not present

## 2021-07-23 NOTE — Telephone Encounter (Signed)
Left message for patient to call back and schedule Medicare Annual Wellness Visit (AWV). Please offer to do virtually or by telephone.  Left office number and my jabber #[phone]. ? ?Due for AWVI ? ?Please schedule at anytime with Nurse Health Advisor. ?  ?

## 2021-07-27 ENCOUNTER — Other Ambulatory Visit: Payer: Self-pay | Admitting: Family

## 2021-07-29 ENCOUNTER — Other Ambulatory Visit: Payer: Medicare Other

## 2021-08-04 ENCOUNTER — Ambulatory Visit (INDEPENDENT_AMBULATORY_CARE_PROVIDER_SITE_OTHER): Payer: Medicare Other

## 2021-08-04 ENCOUNTER — Ambulatory Visit: Payer: Medicare Other

## 2021-08-04 DIAGNOSIS — Z0000 Encounter for general adult medical examination without abnormal findings: Secondary | ICD-10-CM

## 2021-08-04 NOTE — Patient Instructions (Signed)
[name] , ?Thank you for taking time to come for your Medicare Wellness Visit. I appreciate your ongoing commitment to your health goals. Please review the following plan we discussed and let me know if I can assist you in the future.  ? ?Screening recommendations/referrals: ?Colonoscopy: 10/19/17 due 10/20/27 ?Recommended yearly ophthalmology/optometry visit for glaucoma screening and checkup ?Recommended yearly dental visit for hygiene and checkup ? ?Vaccinations: ?Influenza vaccine: Due-May obtain vaccine at your local pharmacy.  ?Pneumococcal vaccine: Due-May obtain vaccine at your local pharmacy.  ?Tdap vaccine: up to date ?Shingles vaccine: Due-May obtain vaccine at your local pharmacy.    ?Covid-19: Due-May obtain vaccine at your local pharmacy.  ? ?Advanced directives: no ? ?Conditions/risks identified: see problem list  ? ?Next appointment: Follow up in one year for your annual wellness visit  ? ?Preventive Care 40-64 Years, Male ?Preventive care refers to lifestyle choices and visits with your health care provider that can promote health and wellness. ?What does preventive care include? ?A yearly physical exam. This is also called an annual well check. ?Dental exams once or twice a year. ?Routine eye exams. Ask your health care provider how often you should have your eyes checked. ?Personal lifestyle choices, including: ?Daily care of your teeth and gums. ?Regular physical activity. ?Eating a healthy diet. ?Avoiding tobacco and drug use. ?Limiting alcohol use. ?Practicing safe sex. ?Taking low-dose aspirin every day starting at age 98. ?What happens during an annual well check? ?The services and screenings done by your health care provider during your annual well check will depend on your age, overall health, lifestyle risk factors, and family history of disease. ?Counseling  ?Your health care provider may ask you questions about your: ?Alcohol use. ?Tobacco use. ?Drug use. ?Emotional well-being. ?Home and  relationship well-being. ?Sexual activity. ?Eating habits. ?Work and work Statistician. ?Screening  ?You may have the following tests or measurements: ?Height, weight, and BMI. ?Blood pressure. ?Lipid and cholesterol levels. These may be checked every 5 years, or more frequently if you are over 34 years old. ?Skin check. ?Lung cancer screening. You may have this screening every year starting at age 18 if you have a 30-pack-year history of smoking and currently smoke or have quit within the past 15 years. ?Fecal occult blood test (FOBT) of the stool. You may have this test every year starting at age 51. ?Flexible sigmoidoscopy or colonoscopy. You may have a sigmoidoscopy every 5 years or a colonoscopy every 10 years starting at age 55. ?Prostate cancer screening. Recommendations will vary depending on your family history and other risks. ?Hepatitis C blood test. ?Hepatitis B blood test. ?Sexually transmitted disease (STD) testing. ?Diabetes screening. This is done by checking your blood sugar (glucose) after you have not eaten for a while (fasting). You may have this done every 1-3 years. ?Discuss your test results, treatment options, and if necessary, the need for more tests with your health care provider. ?Vaccines  ?Your health care provider may recommend certain vaccines, such as: ?Influenza vaccine. This is recommended every year. ?Tetanus, diphtheria, and acellular pertussis (Tdap, Td) vaccine. You may need a Td booster every 10 years. ?Zoster vaccine. You may need this after age 41. ?Pneumococcal 13-valent conjugate (PCV13) vaccine. You may need this if you have certain conditions and have not been vaccinated. ?Pneumococcal polysaccharide (PPSV23) vaccine. You may need one or two doses if you smoke cigarettes or if you have certain conditions. ?Talk to your health care provider about which screenings and vaccines you need and  how often you need them. ?This information is not intended to replace advice given to  you by your health care provider. Make sure you discuss any questions you have with your health care provider. ?Document Released: 05/02/2015 Document Revised: 12/24/2015 Document Reviewed: 02/04/2015 ?Elsevier Interactive Patient Education ? 2017 [employer]. ? ?Fall Prevention in the Home ?Falls can cause injuries. They can happen to people of all ages. There are many things you can do to make your home safe and to help prevent falls. ?What can I do on the outside of my home? ?Regularly fix the edges of walkways and driveways and fix any cracks. ?Remove anything that might make you trip as you walk through a door, such as a raised step or threshold. ?Trim any bushes or trees on the path to your home. ?Use bright outdoor lighting. ?Clear any walking paths of anything that might make someone trip, such as rocks or tools. ?Regularly check to see if handrails are loose or broken. Make sure that both sides of any steps have handrails. ?Any raised decks and porches should have guardrails on the edges. ?Have any leaves, snow, or ice cleared regularly. ?Use sand or salt on walking paths during winter. ?Clean up any spills in your garage right away. This includes oil or grease spills. ?What can I do in the bathroom? ?Use night lights. ?Install grab bars by the toilet and in the tub and shower. Do not use towel bars as grab bars. ?Use non-skid mats or decals in the tub or shower. ?If you need to sit down in the shower, use a plastic, non-slip stool. ?Keep the floor dry. Clean up any water that spills on the floor as soon as it happens. ?Remove soap buildup in the tub or shower regularly. ?Attach bath mats securely with double-sided non-slip rug tape. ?Do not have throw rugs and other things on the floor that can make you trip. ?What can I do in the bedroom? ?Use night lights. ?Make sure that you have a light by your bed that is easy to reach. ?Do not use any sheets or blankets that are too big for your bed. They should not  hang down onto the floor. ?Have a firm chair that has side arms. You can use this for support while you get dressed. ?Do not have throw rugs and other things on the floor that can make you trip. ?What can I do in the kitchen? ?Clean up any spills right away. ?Avoid walking on wet floors. ?Keep items that you use a lot in easy-to-reach places. ?If you need to reach something above you, use a strong step stool that has a grab bar. ?Keep electrical cords out of the way. ?Do not use floor polish or wax that makes floors slippery. If you must use wax, use non-skid floor wax. ?Do not have throw rugs and other things on the floor that can make you trip. ?What can I do with my stairs? ?Do not leave any items on the stairs. ?Make sure that there are handrails on both sides of the stairs and use them. Fix handrails that are broken or loose. Make sure that handrails are as long as the stairways. ?Check any carpeting to make sure that it is firmly attached to the stairs. Fix any carpet that is loose or worn. ?Avoid having throw rugs at the top or bottom of the stairs. If you do have throw rugs, attach them to the floor with carpet tape. ?Make sure that you have  a light switch at the top of the stairs and the bottom of the stairs. If you do not have them, ask someone to add them for you. ?What else can I do to help prevent falls? ?Wear shoes that: ?Do not have high heels. ?Have rubber bottoms. ?Are comfortable and fit you well. ?Are closed at the toe. Do not wear sandals. ?If you use a stepladder: ?Make sure that it is fully opened. Do not climb a closed stepladder. ?Make sure that both sides of the stepladder are locked into place. ?Ask someone to hold it for you, if possible. ?Clearly mark and make sure that you can see: ?Any grab bars or handrails. ?First and last steps. ?Where the edge of each step is. ?Use tools that help you move around (mobility aids) if they are needed. These  include: ?Canes. ?Walkers. ?Scooters. ?Crutches. ?Turn on the lights when you go into a dark area. Replace any light bulbs as soon as they burn out. ?Set up your furniture so you have a clear path. Avoid moving your furniture around. ?If any of

## 2021-08-04 NOTE — Progress Notes (Addendum)
? ?Subjective:  ? [name] is a 66 y.o. male who presents for an Initial Medicare Annual Wellness Visit. ? ?Review of Systems    ? ?Cardiac Risk Factors include: advanced age (>34mn, >>77women);hypertension;dyslipidemia ? ?   ?Objective:  ?  ?Today's Vitals  ? 08/04/21 1348  ?PainSc: 7   ? ?There is no height or weight on file to calculate BMI. ? ? ?  08/04/2021  ?  1:46 PM 05/27/2021  ? 12:16 AM 10/26/2019  ?  9:39 AM 10/16/2019  ?  1:20 PM 04/30/2019  ? 12:08 PM 09/10/2018  ? 12:21 AM 09/03/2018  ?  7:57 PM  ?Advanced Directives  ?Does Patient Have a Medical Advance Directive? No No No No No No No  ?Would patient like information on creating a medical advance directive? No - Patient declined No - Patient declined No - Patient declined No - Patient declined No - Patient declined  No - Patient declined  ? ? ?Current Medications (verified) ?Outpatient Encounter Medications as of 08/04/2021  ?Medication Sig  ? acetaminophen (TYLENOL) 500 MG tablet Take 1,000 mg by mouth every 6 (six) hours as needed for moderate pain.   ? albuterol (PROVENTIL) (2.5 MG/3ML) 0.083% nebulizer solution INHALE 3 ML BY NEBULIZATION EVERY 6 HOURS AS NEEDED FOR WHEEZING OR SHORTNESS OF BREATH  ? albuterol (VENTOLIN HFA) 108 (90 Base) MCG/ACT inhaler TAKE 2 PUFFS BY MOUTH EVERY 6 HOURS AS NEEDED FOR WHEEZE OR SHORTNESS OF BREATH  ? amLODipine (NORVASC) 10 MG tablet Take 1 tablet (10 mg total) by mouth daily.  ? aspirin 325 MG tablet Take 325 mg by mouth daily.  ? benztropine (COGENTIN) 0.5 MG tablet Take 1 tablet (0.5 mg total) by mouth 2 (two) times daily. As needed for side effects of Prolixin  ? Blood Pressure Monitoring (BLOOD PRESSURE CUFF) MISC Use daily as directed to check blood pressure  ? cyclobenzaprine (FLEXERIL) 10 MG tablet TAKE 1 TABLET BY MOUTH EVERYDAY AT BEDTIME  ? diclofenac Sodium (VOLTAREN) 1 % GEL Apply 2 g topically 4 (four) times daily as needed (pain).  ? esomeprazole (NEXIUM) 40 MG capsule TAKE 1 CAPSULE BY MOUTH EVERY  DAY  ? fluPHENAZine (PROLIXIN) 10 MG tablet Take 1 tablet (10 mg total) by mouth daily.  ? fluticasone (FLONASE) 50 MCG/ACT nasal spray SPRAY 2 SPRAYS INTO EACH NOSTRIL EVERY DAY  ? furosemide (LASIX) 20 MG tablet Take 1 tablet (20 mg total) by mouth daily as needed for edema.  ? gabapentin (NEURONTIN) 300 MG capsule Take 1 capsule (300 mg total) by mouth 2 (two) times daily.  ? hydrOXYzine (ATARAX) 25 MG tablet TAKE 0.5-1 TABLETS (12.5-25 MG TOTAL) BY MOUTH DAILY AS NEEDED FOR ANXIETY. PLEASE LIMIT USE  ? KLOR-CON M20 20 MEQ tablet Take 1 tablet (20 mEq total) by mouth daily.  ? naproxen sodium (ALEVE) 220 MG tablet Take 440 mg by mouth daily as needed (pain).   ? sildenafil (VIAGRA) 100 MG tablet Take 100 mg by mouth daily as needed.  ? SYMBICORT 160-4.5 MCG/ACT inhaler INHALE 2 PUFFS INTO THE LUNGS IN THE MORNING AND AT BEDTIME. TAKE 2 PUFFS BY MOUTH TWICE A DAY  ? tamsulosin (FLOMAX) 0.4 MG CAPS capsule Take 0.4 mg by mouth daily.  ? valsartan (DIOVAN) 160 MG tablet Take 1 tablet (160 mg total) by mouth daily.  ? ?No facility-administered encounter medications on file as of 08/04/2021.  ? ? ?Allergies (verified) ?Celecoxib, Iohexol, Ivp dye [iodinated contrast media], Metoclopramide hcl, Ritalin [methylphenidate], and  Wellbutrin [bupropion]  ? ?History: ?Past Medical History:  ?Diagnosis Date  ? ALLERGIC RHINITIS 01/25/2007  ? ANXIETY DISORDER, GENERALIZED 01/25/2007  ? Arthritis   ? Chronic kidney disease   ? stage 3  ? COPD (chronic obstructive pulmonary disease) ([place])   ? DEPRESSION 01/25/2007  ? ESOPHAGITIS 12/28/2007  ? Fatty liver 10/09/2010  ? GERD 01/25/2007  ? GLUCOSE INTOLERANCE, HX OF 01/25/2007  ? Heart murmur   ? hx of   ? HEMORRHOIDS, INTERNAL, WITH BLEEDING 04/26/2008  ? Hiatal hernia   ? HYPERLIPIDEMIA 12/27/2007  ? HYPERTENSION, ESSENTIAL NOS 01/25/2007  ? Hyperthyroidism 08/11/2010  ? HYPERTROPHY PROSTATE W/UR OBST & OTH LUTS 02/27/2010  ? 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 LOW BACK PAIN,  CHRONIC 11/08/2007  ? Pancreatitis   ? Prostate cancer ([place])   ? Rectal abscess   ? 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 SCOLIOSIS NEC 01/25/2007  ? Sebaceous cyst 08/10/2010  ? SMOKER 08/05/2009  ? UPPER GASTROINTESTINAL HEMORRHAGE 01/25/2007  ? ?Past Surgical History:  ?Procedure Laterality Date  ? ABDOMINAL EXPLORATION SURGERY  04/19/1986  ? CHOLECYSTECTOMY  05/20/2008  ? Dr. Zella Richer  ? COLONOSCOPY  04/23/2010  ? normal  ? ESOPHAGOGASTRODUODENOSCOPY N/A 12/31/2013  ? Procedure: ESOPHAGOGASTRODUODENOSCOPY (EGD);  Surgeon: Gatha Mayer, MD;  Location: [facility];  Service: Endoscopy;  Laterality: N/A;  ? Excision of scalp lesion  07/18/2009  ? Excision of thyroid mass    ? GANGLION CYST EXCISION    ? right foot x 2  ? HERNIA REPAIR    ? ventral  ? INCISIONAL HERNIA REPAIR  12/01/2011  ? Procedure: LAPAROSCOPIC INCISIONAL HERNIA;  Surgeon: Harl Bowie, MD;  Location: [facility];  Service: General;  Laterality: N/A;  ? SHOULDER SURGERY    ? right  ? UPPER GASTROINTESTINAL ENDOSCOPY    ? ?Family History  ?Problem Relation Age of Onset  ? Hypertension Mother   ? Bone cancer Mother   ? Hypertension Father   ? Kidney disease Father   ? Bone cancer Brother   ? Kidney disease Brother   ? Mental illness Maternal Grandmother   ? Prostate cancer Other   ? Mental illness Son   ? Colon cancer Neg Hx   ? Colon polyps Neg Hx   ? Esophageal cancer Neg Hx   ? Rectal cancer Neg Hx   ? Stomach cancer Neg Hx   ? ?Social History  ? ?Socioeconomic History  ? Marital status: Married  ?  Spouse name: [name]  ? Number of children: 2  ? Years of education: Not on file  ? Highest education level: Not on file  ?Occupational History  ? Occupation: Disabled  ?  Employer: DISABLED  ?Tobacco Use  ? Smoking status: Every Day  ?  Packs/day: 1.50  ?  Years: 49.00  ?  Pack years: 73.50  ?  Types: Cigarettes  ?  Start date: 2  ? Smokeless tobacco: Never  ? Tobacco comments:  ?  Started smoking at age 62,still smoking pack - 1-1/2 packs   per day 01/08/2021  ?Vaping Use  ? Vaping Use: Never used  ?Substance and Sexual Activity  ? Alcohol use: No  ? Drug use: Yes  ?  Frequency: 1.0 times per week  ?  Types: Marijuana  ?  Comment: 01/19/2021  ? Sexual activity: Yes  ?  Partners: Female  ?Other Topics Concern  ? Not on file  ?Social History Narrative  ? HSG disabled due to schizophrenia -  63. stable long-term marriage with children. cared for his father-in-law- passed away in 2022-07-02  ? ?Social Determinants of Health  ? ?Financial Resource Strain: Not on file  ?Food Insecurity: Not on file  ?Transportation Needs: Not on file  ?Physical Activity: Not on file  ?Stress: Not on file  ?Social Connections: Not on file  ? ? ?Tobacco Counseling ?Ready to quit: Not Answered ?Counseling given: Not Answered ?Tobacco comments: Started smoking at age 27,still smoking pack - 1-1/2 packs  per day 01/08/2021 ? ? ?Clinical Intake: ? ?Pre-visit preparation completed: Yes ? ?Pain : 0-10 ?Pain Score: 7  ?Pain Type: Chronic pain ?Pain Location: Back ?Pain Descriptors / Indicators: Aching, Sore ?Pain Onset: More than a month ago ?Pain Frequency: Constant ? ?  ? ?Nutritional Risks: None ?Diabetes: No ? ?How often do you need to have someone help you when you read instructions, pamphlets, or other written materials from your doctor or pharmacy?: 1 - Never ? ?Diabetic?No ? ?Interpreter Needed?: No ? ?Information entered by :: Angelita Harnack ? ? ?Activities of Daily Living ? ?  08/04/2021  ?  1:53 PM  ?In your present state of health, do you have any difficulty performing the following activities:  ?Hearing? 0  ?Vision? 0  ?Difficulty concentrating or making decisions? 0  ?Walking or climbing stairs? 1  ?Comment sometimes  ?Dressing or bathing? 0  ?Doing errands, shopping? 0  ?Preparing Food and eating ? N  ?Using the Toilet? N  ?In the past six months, have you accidently leaked urine? N  ?Do you have problems with loss of bowel control? N  ?Managing your Medications? N  ?Managing  your Finances? N  ?Housekeeping or managing your Housekeeping? N  ? ? ?Patient Care Team: ?Marrian Salvage, FNP as PCP - General (Internal Medicine) ?Rosamaria Lints, MD (Inactive) as Refer

## 2021-08-08 ENCOUNTER — Ambulatory Visit
Admission: RE | Admit: 2021-08-08 | Discharge: 2021-08-08 | Disposition: A | Discharge status: Home / Self Care | Payer: Medicare Other | Source: Ambulatory Visit | Attending: Neurology | Admitting: Neurology

## 2021-08-08 DIAGNOSIS — M542 Cervicalgia: Secondary | ICD-10-CM | POA: Diagnosis not present

## 2021-08-08 DIAGNOSIS — R269 Unspecified abnormalities of gait and mobility: Secondary | ICD-10-CM

## 2021-08-08 DIAGNOSIS — G8929 Other chronic pain: Secondary | ICD-10-CM

## 2021-08-08 DIAGNOSIS — M5442 Lumbago with sciatica, left side: Secondary | ICD-10-CM | POA: Diagnosis not present

## 2021-08-08 DIAGNOSIS — M5441 Lumbago with sciatica, right side: Secondary | ICD-10-CM | POA: Diagnosis not present

## 2021-08-08 DIAGNOSIS — R29898 Other symptoms and signs involving the musculoskeletal system: Secondary | ICD-10-CM

## 2021-08-10 ENCOUNTER — Telehealth: Payer: Self-pay | Admitting: Neurology

## 2021-08-10 DIAGNOSIS — M4802 Spinal stenosis, cervical region: Secondary | ICD-10-CM

## 2021-08-10 DIAGNOSIS — R269 Unspecified abnormalities of gait and mobility: Secondary | ICD-10-CM

## 2021-08-10 DIAGNOSIS — M48061 Spinal stenosis, lumbar region without neurogenic claudication: Secondary | ICD-10-CM

## 2021-08-10 NOTE — Telephone Encounter (Signed)
Left message for a return call

## 2021-08-10 NOTE — Telephone Encounter (Signed)
Please call patient, MRI of lumbar spine showed mild degenerative changes, there is no evidence of nerve root or spinal cord compression ? ?MRI of cervical spine showed mild degenerative changes, grade stenosis at C4-5 level, no evidence of cord signal abnormality, C6-7 level, severe bilateral foraminal narrowing, potential compression of C7 nerve roots, C5-6, moderate left moderately severe right foraminal narrowing, ? ?If he has worsening pain, gait abnormality, may consider physical therapy referral, otherwise keep follow-up appointment ? ? ? ? ?IMPRESSION: This MRI of the lumbar spine without contrast shows the following: ?1.  Mild degenerative changes at L1-L2 and L2-L3 that do not lead to spinal stenosis or nerve root compression. ?2.  Minimal spinal stenosis at L3-L4 and moderate bilateral foraminal narrowing.  The degenerative changes encroach upon the L3 nerve root without causing nerve root compression. ?3.  Degenerative changes at L4-L5 cause moderate right greater than left bilateral foraminal narrowing.  The degenerative changes encroach upon the L4 nerve root without causing nerve root compression. ?  ?IMPRESSION: This MRI of the cervical spine without contrast shows the following: ?1.  At C2-C3, and C3-C4, there is mild spinal stenosis due to central disc protrusions but no significant foraminal narrowing and no nerve root compression. ?2.  At C4-C5, there is moderate spinal stenosis due to a central disc protrusion that also indents the thecal sac.  There does not appear to be any nerve root compression. ?3.  At C5-C6, there is mild spinal stenosis due to degenerative changes that also cause moderate left and moderately severe right bilateral foraminal narrowing.  There is potential for right C6 nerve root compression. ?4.  At C6-C7, there are bilateral disc protrusions and other degenerative change causing mild spinal stenosis and moderately severe bilateral foraminal narrowing with potential to  compress the C7 nerve roots. ?5.  The spinal cord appears normal. ?  ?

## 2021-08-11 NOTE — Addendum Note (Signed)
Addended by: Noberto Retort C on: 08/11/2021 11:52 AM ? ? Modules accepted: Orders ? ?

## 2021-08-11 NOTE — Telephone Encounter (Signed)
Orders placed for PT. He would still like to keep pending appt to further review results with Dr. Krista Blue.  ?

## 2021-08-11 NOTE — Telephone Encounter (Signed)
Pt called back and stated he changed his mind and would like to be referred to receive PT. Please advise. ?

## 2021-08-11 NOTE — Telephone Encounter (Signed)
I spoke to the patient and provided his MRI results. He declined the PT referral, stating he exercises at home. I explained PT would be a more focused plan based on his identified needs (discomfort, gait). He is not completely against it but wishes to see Dr. Krista Blue in an office visit first. He would like to discuss the MRI scans and treatment plan in more detail. His appt has been moved to 08/26/21. His wife will also attend. ?

## 2021-08-11 NOTE — Telephone Encounter (Signed)
I spoke to the patient's wife. Provided the results below. She is in agreement with him attending PT and having an earlier follow up. ?

## 2021-08-11 NOTE — Telephone Encounter (Signed)
Pt's wife would like a call from the nurse to discuss results. Pt was half asleep this morning and did not fully understand results. ?

## 2021-08-12 ENCOUNTER — Ambulatory Visit: Payer: Medicare Other | Attending: Neurology

## 2021-08-12 VITALS — BP 125/85 | HR 93

## 2021-08-12 DIAGNOSIS — R262 Difficulty in walking, not elsewhere classified: Secondary | ICD-10-CM | POA: Insufficient documentation

## 2021-08-12 DIAGNOSIS — R2681 Unsteadiness on feet: Secondary | ICD-10-CM | POA: Diagnosis not present

## 2021-08-12 DIAGNOSIS — R269 Unspecified abnormalities of gait and mobility: Secondary | ICD-10-CM | POA: Diagnosis not present

## 2021-08-12 DIAGNOSIS — M4802 Spinal stenosis, cervical region: Secondary | ICD-10-CM | POA: Insufficient documentation

## 2021-08-12 DIAGNOSIS — M6281 Muscle weakness (generalized): Secondary | ICD-10-CM | POA: Insufficient documentation

## 2021-08-12 DIAGNOSIS — R2689 Other abnormalities of gait and mobility: Secondary | ICD-10-CM | POA: Insufficient documentation

## 2021-08-12 DIAGNOSIS — M48061 Spinal stenosis, lumbar region without neurogenic claudication: Secondary | ICD-10-CM | POA: Insufficient documentation

## 2021-08-12 NOTE — Therapy (Signed)
?OUTPATIENT PHYSICAL THERAPY NEURO EVALUATION ? ? ?Patient Name: [name] ?MRN: [id number] ?DOB:01-Jan-1956, 66 y.o., male ?Today's Date: 08/12/2021 ? ?PCP: Marrian Salvage, [place] ?REFERRING PROVIDER: Marcial Pacas, MD ? ? PT End of Session - 08/12/21 0901   ? ? Visit Number 1   ? Number of Visits 13   ? Date for PT Re-Evaluation 09/25/21   ? Authorization Type [employer] Medicare (10th Visit PN)   ? Progress Note Due on Visit 10   ? PT Start Time [phone]   ? PT Stop Time 0949   ? PT Time Calculation (min) 43 min   ? Activity Tolerance Patient tolerated treatment well   ? Behavior During Therapy [facility] for tasks assessed/performed   ? ?  ?  ? ?  ? ? ?Past Medical History:  ?Diagnosis Date  ? ALLERGIC RHINITIS 01/25/2007  ? ANXIETY DISORDER, GENERALIZED 01/25/2007  ? Arthritis   ? Chronic kidney disease   ? stage 3  ? COPD (chronic obstructive pulmonary disease) ([place])   ? DEPRESSION 01/25/2007  ? ESOPHAGITIS 12/28/2007  ? Fatty liver 10/09/2010  ? GERD 01/25/2007  ? GLUCOSE INTOLERANCE, HX OF 01/25/2007  ? Heart murmur   ? hx of   ? HEMORRHOIDS, INTERNAL, WITH BLEEDING 04/26/2008  ? Hiatal hernia   ? HYPERLIPIDEMIA 12/27/2007  ? HYPERTENSION, ESSENTIAL NOS 01/25/2007  ? Hyperthyroidism 08/11/2010  ? HYPERTROPHY PROSTATE W/UR OBST & OTH LUTS 02/27/2010  ? 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 LOW BACK PAIN, CHRONIC 11/08/2007  ? Pancreatitis   ? Prostate cancer ([place])   ? Rectal abscess   ? 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 SCOLIOSIS NEC 01/25/2007  ? Sebaceous cyst 08/10/2010  ? SMOKER 08/05/2009  ? UPPER GASTROINTESTINAL HEMORRHAGE 01/25/2007  ? ?Past Surgical History:  ?Procedure Laterality Date  ? ABDOMINAL EXPLORATION SURGERY  04/19/1986  ? CHOLECYSTECTOMY  05/20/2008  ? Dr. Zella Richer  ? COLONOSCOPY  04/23/2010  ? normal  ? ESOPHAGOGASTRODUODENOSCOPY N/A 12/31/2013  ? Procedure: ESOPHAGOGASTRODUODENOSCOPY (EGD);  Surgeon: Gatha Mayer, MD;  Location: [facility];  Service: Endoscopy;  Laterality: N/A;  ?  Excision of scalp lesion  07/18/2009  ? Excision of thyroid mass    ? GANGLION CYST EXCISION    ? right foot x 2  ? HERNIA REPAIR    ? ventral  ? INCISIONAL HERNIA REPAIR  12/01/2011  ? Procedure: LAPAROSCOPIC INCISIONAL HERNIA;  Surgeon: Harl Bowie, MD;  Location: [facility];  Service: General;  Laterality: N/A;  ? SHOULDER SURGERY    ? right  ? UPPER GASTROINTESTINAL ENDOSCOPY    ? ?Patient Active Problem List  ? Diagnosis Date Noted  ? Gait abnormality 07/14/2021  ? Chronic bilateral low back pain with bilateral sciatica 07/14/2021  ? Neck pain 07/14/2021  ? Left hand weakness 07/14/2021  ? Trigger finger, right middle finger 06/02/2021  ? Coronary artery calcification 08/15/2020  ? Nodule of apex of right lung 05/29/2020  ? Malignant neoplasm of prostate ([place]) 10/26/2019  ? Body mass index (BMI) 25.0-25.9, adult 10/25/2019  ? Spondylosis of cervical region without myelopathy or radiculopathy 09/26/2019  ? Numbness of hand 06/22/2019  ? Ulnar neuropathy at elbow of left upper extremity 06/05/2019  ? Chronic obstructive pulmonary disease ([place]) 10/24/2018  ? Cervical radiculopathy at C8 01/18/2018  ? Right lateral epicondylitis 12/21/2017  ? Pain in joint, shoulder region 04/23/2015  ? Periumbilical abdominal pain 02/13/2015  ? Elevated PSA 08/04/2014  ? Subacromial bursitis 04/11/2014  ? Neck pain on right  side 03/05/2014  ? Bilateral shoulder pain 03/05/2014  ? Recurrent boils 03/05/2014  ? Foreign body in stomach 12/31/2013  ? Reflux esophagitis 12/31/2013  ? Weight loss 05/06/2012  ? Incisional hernia 11/23/2011  ? Cramp of limb 01/13/2011  ? Hyperthyroidism 08/11/2010  ? Sebaceous cyst 08/10/2010  ? HYPERTROPHY PROSTATE W/UR OBST & OTH LUTS 02/27/2010  ? Tobacco use disorder 08/05/2009  ? 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 HEMORRHOIDS, INTERNAL, WITH BLEEDING 04/26/2008  ? Hyperlipidemia 12/27/2007  ? LOW BACK PAIN, CHRONIC 11/08/2007  ? Schizophrenia ([place]) 01/25/2007  ? Anxiety disorder 01/25/2007   ? DEPRESSION 01/25/2007  ? Elevated blood pressure reading in office with diagnosis of hypertension 01/25/2007  ? ALLERGIC RHINITIS 01/25/2007  ? SCOLIOSIS NEC 01/25/2007  ? Impaired glucose tolerance 01/25/2007  ? ? ?ONSET DATE: 08/11/2021 (Referral Date) ? ?REFERRING DIAG: R26.9 (ICD-10-CM) - Gait abnormality ?M48.02 (ICD-10-CM) - Cervical spinal stenosis ?M48.061 (ICD-10-CM) - Spinal stenosis of lumbar region, unspecified whether neurogenic claudication present ? ?THERAPY DIAG:  ?Other abnormalities of gait and mobility ? ?Difficulty in walking, not elsewhere classified ? ?Unsteadiness on feet ? ?Muscle weakness (generalized) ? ?SUBJECTIVE:  ?                                                                                                                                                                                           ? ?SUBJECTIVE STATEMENT: ?Patient reports that he has had a very bad back for many years. Reports that he starts walking approx half a block he notices the pain starts to increase, and then feel like both legs begin to get weak on him and fatigued. Reports some numbness/tingling in the right hip area. Patient reports that he feels very imbalanced when he is walking. Has had three falls in the past few months. Not currently using a cane or walker, reports he has tried using a cane but doesn't feel like it has gotten that bad to need it.  ? ?Pt accompanied by: self ? ?PERTINENT HISTORY: Anxiety, Arthritis, COPD, CKD, Depression, GERD, Heart Murmur, HTN, HLD, Hyperthyroidism, Low Back Pain, Pancreatitis, Prostate Cancer, Schizophrenia ? ?PAIN:  ?Are you having pain? No ? ?Today's Vitals  ? 08/12/21 0917  ?BP: 125/85  ?Pulse: 93  ? ?There is no height or weight on file to calculate BMI. ? ?PRECAUTIONS: Fall ? ?FALLS: Has patient fallen in last 6 months? Yes. Number of falls 3 ? ?LIVING ENVIRONMENT: ?Lives with: lives with their spouse and lives with their son ?Lives in: House/apartment ?Stairs:  No ?Has following equipment at home: Single point cane ? ?PLOF:  On disability ? ?PATIENT GOALS: Improve  the Balance, Improve the Pain, and Improve the Walking ? ?OBJECTIVE:  ? DIAGNOSTIC FINDINGS:  ? ?This MRI of the lumbar spine without contrast shows the following: ?1.  Mild degenerative changes at L1-L2 and L2-L3 that do not lead to spinal stenosis or nerve root compression. ?2.  Minimal spinal stenosis at L3-L4 and moderate bilateral foraminal narrowing.  The degenerative changes encroach upon the L3 nerve root without causing nerve root compression. ?3.  Degenerative changes at L4-L5 cause moderate right greater than left bilateral foraminal narrowing.  The degenerative changes encroach upon the L4 nerve root without causing nerve root compression. ? ?COGNITION: ?Overall cognitive status: Within functional limits for tasks assessed ?  ?SENSATION: ?Appears intact; but reports decreased sensation on RLE > LLE.  ? ?COORDINATION: ? WNL ? ?POSTURE: rounded shoulders and forward head ? ?MMT:   ? ?MMT Right ?08/12/2021 Left ?08/12/2021  ?Hip flexion 4-/5 4/5  ?Hip extension    ?Hip abduction 4/5 4/5  ?Hip adduction    ?Hip internal rotation    ?Hip external rotation    ?Knee flexion 4/5 4+/5  ?Knee extension 4/5 4+/5  ?Ankle dorsiflexion 4/5 4/5  ?Ankle plantarflexion    ?Ankle inversion    ?Ankle eversion    ?(Blank rows = not tested) ? ? ?TRANSFERS: ?Assistive device utilized: None  ?Sit to stand: CGA ?Stand to sit: CGA ? ?STAIRS: ? Level of Assistance: SBA ? Stair Negotiation Technique: Alternating Pattern  with Bilateral Rails ? Number of Stairs: 8  ? Height of Stairs: 6  ?Comments: mild unsteadiness with descent; require use of rails ? ?GAIT: ?Gait pattern: step through pattern, antalgic, and lateral lean- Left ?Distance walked: 115 ft; plus additional gait with completion of 6MWT ?Assistive device utilized: None ?Level of assistance: SBA and CGA ?Comments: 1-2 instances of imbalance noted, most notable with quick  changes in direction ?Gait Speed: 10.60 seconds = 3.09 ft/sec ? ?FUNCTIONAL TESTs:  ?5 times sit to stand: 12.97 seconds without UE support; decreased control with descent ?Timed up and go (TUG): 10.59

## 2021-08-17 ENCOUNTER — Ambulatory Visit: Payer: Medicare Other | Attending: Neurology | Admitting: Physical Therapy

## 2021-08-17 ENCOUNTER — Encounter: Payer: Self-pay | Admitting: Physical Therapy

## 2021-08-17 DIAGNOSIS — R262 Difficulty in walking, not elsewhere classified: Secondary | ICD-10-CM | POA: Insufficient documentation

## 2021-08-17 DIAGNOSIS — R2689 Other abnormalities of gait and mobility: Secondary | ICD-10-CM | POA: Diagnosis not present

## 2021-08-17 DIAGNOSIS — M6281 Muscle weakness (generalized): Secondary | ICD-10-CM | POA: Diagnosis not present

## 2021-08-17 DIAGNOSIS — R2681 Unsteadiness on feet: Secondary | ICD-10-CM | POA: Diagnosis not present

## 2021-08-17 NOTE — Therapy (Signed)
?OUTPATIENT PHYSICAL THERAPY TREATMENT NOTE ? ? ?Patient Name: [name] ?MRN: [id number] ?DOB:May 31, 1955, 66 y.o., male ?Today's Date: 08/17/2021 ? ?PCP: Marrian Salvage, [place] ?REFERRING PROVIDER: Marcial Pacas, MD ? ?END OF SESSION:  ? PT End of Session - 08/17/21 1021   ? ? Visit Number 2   ? Number of Visits 13   ? Date for PT Re-Evaluation 09/25/21   ? Authorization Type [employer] Medicare (10th Visit PN)   ? Progress Note Due on Visit 10   ? PT Start Time 1016   ? PT Stop Time 1100   ? PT Time Calculation (min) 44 min   ? Equipment Utilized During Treatment Gait belt   ? Activity Tolerance Patient tolerated treatment well   ? Behavior During Therapy [facility] for tasks assessed/performed   ? ?  ?  ? ?  ? ? ?Past Medical History:  ?Diagnosis Date  ? ALLERGIC RHINITIS 01/25/2007  ? ANXIETY DISORDER, GENERALIZED 01/25/2007  ? Arthritis   ? Chronic kidney disease   ? stage 3  ? COPD (chronic obstructive pulmonary disease) ([place])   ? DEPRESSION 01/25/2007  ? ESOPHAGITIS 12/28/2007  ? Fatty liver 10/09/2010  ? GERD 01/25/2007  ? GLUCOSE INTOLERANCE, HX OF 01/25/2007  ? Heart murmur   ? hx of   ? HEMORRHOIDS, INTERNAL, WITH BLEEDING 04/26/2008  ? Hiatal hernia   ? HYPERLIPIDEMIA 12/27/2007  ? HYPERTENSION, ESSENTIAL NOS 01/25/2007  ? Hyperthyroidism 08/11/2010  ? HYPERTROPHY PROSTATE W/UR OBST & OTH LUTS 02/27/2010  ? 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 LOW BACK PAIN, CHRONIC 11/08/2007  ? Pancreatitis   ? Prostate cancer ([place])   ? Rectal abscess   ? 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 SCOLIOSIS NEC 01/25/2007  ? Sebaceous cyst 08/10/2010  ? SMOKER 08/05/2009  ? UPPER GASTROINTESTINAL HEMORRHAGE 01/25/2007  ? ?Past Surgical History:  ?Procedure Laterality Date  ? ABDOMINAL EXPLORATION SURGERY  04/19/1986  ? CHOLECYSTECTOMY  05/20/2008  ? Dr. Zella Richer  ? COLONOSCOPY  04/23/2010  ? normal  ? ESOPHAGOGASTRODUODENOSCOPY N/A 12/31/2013  ? Procedure: ESOPHAGOGASTRODUODENOSCOPY (EGD);  Surgeon: Gatha Mayer, MD;   Location: [facility];  Service: Endoscopy;  Laterality: N/A;  ? Excision of scalp lesion  07/18/2009  ? Excision of thyroid mass    ? GANGLION CYST EXCISION    ? right foot x 2  ? HERNIA REPAIR    ? ventral  ? INCISIONAL HERNIA REPAIR  12/01/2011  ? Procedure: LAPAROSCOPIC INCISIONAL HERNIA;  Surgeon: Harl Bowie, MD;  Location: [facility];  Service: General;  Laterality: N/A;  ? SHOULDER SURGERY    ? right  ? UPPER GASTROINTESTINAL ENDOSCOPY    ? ?Patient Active Problem List  ? Diagnosis Date Noted  ? Gait abnormality 07/14/2021  ? Chronic bilateral low back pain with bilateral sciatica 07/14/2021  ? Neck pain 07/14/2021  ? Left hand weakness 07/14/2021  ? Trigger finger, right middle finger 06/02/2021  ? Coronary artery calcification 08/15/2020  ? Nodule of apex of right lung 05/29/2020  ? Malignant neoplasm of prostate ([place]) 10/26/2019  ? Body mass index (BMI) 25.0-25.9, adult 10/25/2019  ? Spondylosis of cervical region without myelopathy or radiculopathy 09/26/2019  ? Numbness of hand 06/22/2019  ? Ulnar neuropathy at elbow of left upper extremity 06/05/2019  ? Chronic obstructive pulmonary disease ([place]) 10/24/2018  ? Cervical radiculopathy at C8 01/18/2018  ? Right lateral epicondylitis 12/21/2017  ? Pain in joint, shoulder region 04/23/2015  ? Periumbilical abdominal pain 02/13/2015  ? Elevated  PSA 08/04/2014  ? Subacromial bursitis 04/11/2014  ? Neck pain on right side 03/05/2014  ? Bilateral shoulder pain 03/05/2014  ? Recurrent boils 03/05/2014  ? Foreign body in stomach 12/31/2013  ? Reflux esophagitis 12/31/2013  ? Weight loss 05/06/2012  ? Incisional hernia 11/23/2011  ? Cramp of limb 01/13/2011  ? Hyperthyroidism 08/11/2010  ? Sebaceous cyst 08/10/2010  ? HYPERTROPHY PROSTATE W/UR OBST & OTH LUTS 02/27/2010  ? Tobacco use disorder 08/05/2009  ? 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 HEMORRHOIDS, INTERNAL, WITH BLEEDING 04/26/2008  ? Hyperlipidemia 12/27/2007  ? LOW BACK PAIN, CHRONIC 11/08/2007   ? Schizophrenia ([place]) 01/25/2007  ? Anxiety disorder 01/25/2007  ? DEPRESSION 01/25/2007  ? Elevated blood pressure reading in office with diagnosis of hypertension 01/25/2007  ? ALLERGIC RHINITIS 01/25/2007  ? SCOLIOSIS NEC 01/25/2007  ? Impaired glucose tolerance 01/25/2007  ? ? ?REFERRING DIAG: R26.9 (ICD-10-CM) - Gait abnormality M48.02 (ICD-10-CM) - Cervical spinal stenosis M48.061 (ICD-10-CM) - Spinal stenosis of lumbar region, unspecified whether neurogenic claudication present  ? ?THERAPY DIAG:  ?Other abnormalities of gait and mobility ? ?Difficulty in walking, not elsewhere classified ? ?Unsteadiness on feet ? ?Muscle weakness (generalized) ? ?PERTINENT HISTORY: Anxiety, Arthritis, COPD, CKD, Depression, GERD, Heart Murmur, HTN, HLD, Hyperthyroidism, Low Back Pain, Pancreatitis, Prostate Cancer, Schizophrenia ? ?PRECAUTIONS: Fall ? ?SUBJECTIVE: Nothing new to report, no falls. ? ?PAIN:  ?Are you having pain? Yes: NPRS scale: 7/10 ?Pain location: Midline cervical ?Pain description: achy ?Aggravating factors: sleeping ?Relieving factors: vick's vapor rub ? ? ?OBJECTIVE:  ?TODAY'S TREATMENT: ?Initiated HEP: ?Supine Bridges 2x8, cued for slow pace and within comfortable ROM ?STS no UE support 2x10 ?Heel raises at EOM 2x12, no claudication pain ?Marches 2x20 each LE-edu on modifiying exercise to perform continuous marching for various increments of time instead of counting each march (ex. 30sec per set) ?Standing in corner feet together EO x30sec>FT EC <10sec w/ significant sway, modified w/ chair in front and single fingertip support on back of chair x30 sec, 2x30sec EC no UE support. ?Encouraged pt to use 30sec rest between sets to prevent cramping. ? ?24,135' continuously no AD, no reports of claudication pain in calf, mild R hip and low back discomfort.  Ambulates w/ good speed, intermittent heel scuff, cued for heel strike w/ difficulty maintaining.  Various gait distances around gym.  He demos adequate DF  with poor posterior foot clearance. ? ?PATIENT EDUCATION: ?Education details: Initial HEP, modifications to set-up for safety.  Edu on walking program w/ wife 5-59mns at a time, 2-3x daily. ?Person educated: Patient ?Education method: Explanation ?Education comprehension: verbalized understanding ?  ?  ?HOME EXERCISE PROGRAM: ?Access Code: [email or web address]?URL: [email or web address] ?Date: 08/17/2021 ?Prepared by: MElease Etienne? ?Exercises ?- Supine Bridge  - 1 x daily - 7 x weekly - 2 sets - 8 reps ?- Sit to Stand  - 1 x daily - 7 x weekly - 2 sets - 10 reps ?- Standing Heel Raise  - 1 x daily - 7 x weekly - 2 sets - 12 reps ?- Standing Hip Flexion March  - 1 x daily - 7 x weekly - 2 sets - 20 reps ?- Corner Balance Feet Together With Eyes Closed  - 1 x daily - 7 x weekly - 2 sets - 1 reps - 30 seconds hold ?  ?  ?  ?GOALS: ?Goals reviewed with patient? Yes ?  ?SHORT TERM GOALS: Target date: 09/04/2021 ?  ?Pt will be independent with initial HEP for  improved strength and balance  ?Baseline: no HEP established ?Goal status: INITIAL ?  ?2.  Pt will report completion of daily walking program >/= 10 minutes at one time for improved activity tolernace ?Baseline: 5 mins ?Goal status: INITIAL ?  ?3.  Pt will improve FGA to >/= 23/30 to demonstrate improved balance and reduced fall risk ?Baseline: 20/30  ?Goal status: INITIAL ?  ?4.  Pt will improve 5x STS to </= 10 secs to demo improved functional LE strength and balance  ?Baseline: 12.97 secs ?Goal status: INITIAL ?  ?  ?LONG TERM GOALS: Target date: 09/25/2021 ?  ?Pt will be independent with final HEP for improved balance/strength  ?Baseline: no HeP established ?Goal status: INITIAL ?  ?2.  Pt will improve FGA to >/= 26/30 to demonstrate improved balance and reduced fall risk ?Baseline: 20/30 ?Goal status: INITIAL ?  ?3.  Pt will improve gait speed to >/= 3.4 ft/sec to demonstrate improved community ambulation ?Baseline: 3.09 ft/sec ?Goal status: INITIAL ?  ?4.   Pt will be able to ambulate >/= 1700 ft with 6MWT with reports of </= 3/10 pain ?Baseline: 1572 ft, 5/10 pain ?Goal status: INITIAL ?  ?5.  Pt will report ability to ambulate >/= 20 minutes daily for

## 2021-08-21 ENCOUNTER — Ambulatory Visit: Payer: Medicare Other | Admitting: Physical Therapy

## 2021-08-26 ENCOUNTER — Ambulatory Visit (INDEPENDENT_AMBULATORY_CARE_PROVIDER_SITE_OTHER): Payer: Medicare Other | Admitting: Neurology

## 2021-08-26 ENCOUNTER — Encounter: Payer: Self-pay | Admitting: Neurology

## 2021-08-26 VITALS — BP 127/85 | HR 91 | Ht 69.0 in | Wt 244.5 lb

## 2021-08-26 DIAGNOSIS — M542 Cervicalgia: Secondary | ICD-10-CM

## 2021-08-26 DIAGNOSIS — R29898 Other symptoms and signs involving the musculoskeletal system: Secondary | ICD-10-CM

## 2021-08-26 DIAGNOSIS — G5622 Lesion of ulnar nerve, left upper limb: Secondary | ICD-10-CM | POA: Insufficient documentation

## 2021-08-26 DIAGNOSIS — R202 Paresthesia of skin: Secondary | ICD-10-CM | POA: Insufficient documentation

## 2021-08-26 MED ORDER — CELECOXIB 50 MG PO CAPS: 50.0000 mg | ORAL_CAPSULE | Freq: Two times a day (BID) | ORAL | 6 refills | Status: DC | PRN

## 2021-08-26 NOTE — Progress Notes (Signed)
? ?Chief Complaint  ?Patient presents with  ? Follow-up  ?  Rm 15. Accompanied by wife. ?early follow up for review of MRI results.  ? ? ? ? ?ASSESSMENT AND PLAN ? ?[name] is a 66 y.o. male   ?Gait abnormality, ?Worsening chronic low back pain radiating pain to right lower extremity ?Worsening neck pain,  ?History of left ulnar transposition nerve surgery across the elbow, with residual left hand weakness,4th and 5th finger numbness ? Personally reviewed MRI of lumbar multilevel degenerative changes no significant foraminal canal stenosis ? MRI of cervical spine, multilevel degenerative changes no canal stenosis, C5-6, C6-7, severe right by foraminal narrowing, ? But patient denies significant pain, mild residual left hand finger abduction weakness, sensory loss at fourth and fifth fingers, he would benefit physical therapy, neck stretching exercise ? Celebrex 50 mg as needed ?  ? ?DIAGNOSTIC DATA (LABS, IMAGING, TESTING) ?- I reviewed patient records, labs, notes, testing and imaging myself where available. ? ? ?MEDICAL HISTORY: ? ?[name], is a 66 year old male, seen in request by his primary care nurse practitioner   Marrian Salvage, for evaluation of gait abnormality, worsening low back and neck pain, initial evaluation was on July 14, 2021. ? ?I reviewed and summarized the referring note. PMHX ?HTN ?GERD ?COPD, smoke 1/2 ppd ?Schizophrenia ?Obesity ?Multiple abdominal surgery for hernia ?Prostate Cancer,  ?Hx of left ulnar decompression ? 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 ?He fell 3 months ago, landed on his right hip, since then, he had worsening low back pain, radiating pain to right hip, lower extremity, he used to be able to walk blocks in his neighborhood, now off to half block, he will get short  winded, bilateral lower extremity weakness, could not continue, ? ?He also had a history of chronic neck pain, radiating pain to bilateral shoulder, recent worsening radiating pain to right shoulder, he had a history of left ulnar decompression few years back, without resolving his left fourth and fifth finger paresthesia, worsening left hand muscle weakness ? ?He had a history of prostate cancer, postsurgical infection per patient, since then, he had frequent urination, urgency, denies bowel incontinence ? ?X-ray of pelvic and right hip showed mild degenerative changes without acute abnormality ? ?Personally reviewed x-ray of lumbar November 2022, mild degenerative changes ?Laboratory evaluation in 2023, normal CMP CBC, B12 TSH ? ?Update Aug 26, 2021 ?He is accompanied by his wife at today's clinical visit, continued complaints of intermittent neck pain, low back pain, low back pain has limited his activity level, he tends to sit down most of the time ? ?Personally reviewed MRI of lumbar spine mild degenerative changes no significant canal foraminal narrowing ? ?MRI of cervical spine multilevel degenerative changes, no significant canal stenosis, at C5-6, C6-7, evident of severe bilateral foraminal narrowing, ? ?He is taking gabapentin 300 mg twice a day with limited help, ? ?History of left ulnar trans elbow trans-positional surgery, with mild residual left finger abduction weakness, left fourth and fifth finger numbness ? ? ?PHYSICAL EXAM: ?  ?Vitals:  ? 08/26/21 1100  ?BP: 127/85  ?Pulse: 91  ?Weight: 244 lb 8 oz (110.9 kg)  ?Height: '5\' 9"'$  (1.753 m)  ? ?Not recorded ?  ? ? ?Body mass index is 36.11 kg/m?. ? ?PHYSICAL EXAMNIATION: ? ?Gen: NAD, conversant, well  nourised, well groomed                     ?Cardiovascular: Regular rate rhythm, no peripheral edema, warm, nontender. ?Eyes: Conjunctivae clear without exudates or hemorrhage ?Neck: Supple, no carotid bruits. ?Pulmonary: Clear to auscultation bilaterally   ? ?NEUROLOGICAL EXAM: ? ?MENTAL STATUS: Obese,  ?Speech/cognition: ?  mildly slurred endentured speech, oriented to history taking and casual conversation ?  ?CRANIAL NERVES: ?CN II: Visual fields are full to confrontation. Pupils are round equal and briskly reactive to light. ?CN III, IV, VI: extraocular movement are normal. No ptosis. ?CN V: Facial sensation is intact to light touch ?CN VII: Face is symmetric with normal eye closure  ?CN VIII: Hearing is normal to causal conversation. ?CN IX, X: Phonation is normal. ?CN XI: Head turning and shoulder shrug are intact ? ?MOTOR: Mild left hand intrinsic muscle atrophy, mild finger abduction, left fourth and fifth finger flexion weakness, well-healed left medial forearm scar. ? ?REFLEXES: ?Reflexes are symmetric at the 1/1 biceps, 2/2 triceps, 3/3 knees, and absent at ankles. Plantar responses are flexor. ? ?SENSORY: ?Intact to light touch, pinprick and vibratory sensation are intact in fingers and toes. ? ?COORDINATION: ?There is no trunk or limb dysmetria noted. ? ?GAIT/STANCE: He needs push-up to get up from seated position, mildly antalgic, ? ?REVIEW OF SYSTEMS:  ?Full 14 system review of systems performed and notable only for as above ?All other review of systems were negative. ? ? ?ALLERGIES: ?Allergies  ?Allergen Reactions  ? Celecoxib Other (See Comments)  ?   rectal bleeding  ? Iohexol Other (See Comments)  ?  Affects nerves: triggers schizophrenia  ? Ivp Dye [Iodinated Contrast Media]   ? Metoclopramide Hcl Other (See Comments)  ?  Affects nerves: triggers schizophrenia  ? Ritalin [Methylphenidate]   ? Wellbutrin [Bupropion] Other (See Comments)  ?  Makes pt smoke heavily  ? ? ?HOME MEDICATIONS: ?Current Outpatient Medications  ?Medication Sig Dispense Refill  ? acetaminophen (TYLENOL) 500 MG tablet Take 1,000 mg by mouth every 6 (six) hours as needed for moderate pain.     ? albuterol (PROVENTIL) (2.5 MG/3ML) 0.083% nebulizer solution INHALE 3 ML BY  NEBULIZATION EVERY 6 HOURS AS NEEDED FOR WHEEZING OR SHORTNESS OF BREATH 150 mL 1  ? albuterol (VENTOLIN HFA) 108 (90 Base) MCG/ACT inhaler TAKE 2 PUFFS BY MOUTH EVERY 6 HOURS AS NEEDED FOR WHEEZE OR SHORTNESS OF BREATH 8.5 each 6  ? amLODipine (NORVASC) 10 MG tablet Take 1 tablet (10 mg total) by mouth daily. 90 tablet 3  ? aspirin 325 MG tablet Take 325 mg by mouth daily.    ? benztropine (COGENTIN) 0.5 MG tablet Take 1 tablet (0.5 mg total) by mouth 2 (two) times daily. As needed for side effects of Prolixin 180 tablet 0  ? Blood Pressure Monitoring (BLOOD PRESSURE CUFF) MISC Use daily as directed to check blood pressure 1 each 0  ? cyclobenzaprine (FLEXERIL) 10 MG tablet TAKE 1 TABLET BY MOUTH EVERYDAY AT BEDTIME 30 tablet 3  ? diclofenac Sodium (VOLTAREN) 1 % GEL Apply 2 g topically 4 (four) times daily as needed (pain). 150 g 1  ? esomeprazole (NEXIUM) 40 MG capsule TAKE 1 CAPSULE BY MOUTH EVERY DAY 90 capsule 3  ? fluPHENAZine (PROLIXIN) 10 MG tablet Take 1 tablet (10 mg total) by mouth daily. 90 tablet 1  ? fluticasone (FLONASE) 50 MCG/ACT nasal spray SPRAY 2 SPRAYS INTO EACH NOSTRIL EVERY DAY 48 mL 2  ?  furosemide (LASIX) 20 MG tablet Take 1 tablet (20 mg total) by mouth daily as needed for edema. 30 tablet 0  ? gabapentin (NEURONTIN) 300 MG capsule Take 1 capsule (300 mg total) by mouth 2 (two) times daily. 180 capsule 3  ? hydrOXYzine (ATARAX) 25 MG tablet TAKE 0.5-1 TABLETS (12.5-25 MG TOTAL) BY MOUTH DAILY AS NEEDED FOR ANXIETY. PLEASE LIMIT USE 90 tablet 0  ? KLOR-CON M20 20 MEQ tablet Take 1 tablet (20 mEq total) by mouth daily. 90 tablet 3  ? naproxen sodium (ALEVE) 220 MG tablet Take 440 mg by mouth daily as needed (pain).     ? sildenafil (VIAGRA) 100 MG tablet Take 100 mg by mouth daily as needed.    ? SYMBICORT 160-4.5 MCG/ACT inhaler INHALE 2 PUFFS INTO THE LUNGS IN THE MORNING AND AT BEDTIME. TAKE 2 PUFFS BY MOUTH TWICE A DAY 30.6 each 1  ? tamsulosin (FLOMAX) 0.4 MG CAPS capsule Take 0.4 mg by  mouth daily.    ? valsartan (DIOVAN) 160 MG tablet Take 1 tablet (160 mg total) by mouth daily. 90 tablet 3  ? ?No current facility-administered medications for this visit.  ? ? ?PAST MEDICAL HISTORY: ?Past Med

## 2021-08-28 ENCOUNTER — Ambulatory Visit: Payer: Medicare Other | Admitting: Physical Therapy

## 2021-08-31 ENCOUNTER — Ambulatory Visit: Payer: Medicare Other | Admitting: Physical Therapy

## 2021-09-01 ENCOUNTER — Encounter: Payer: Self-pay | Admitting: Gastroenterology

## 2021-09-02 ENCOUNTER — Ambulatory Visit: Payer: Medicare Other | Admitting: Physical Therapy

## 2021-09-07 ENCOUNTER — Ambulatory Visit: Payer: Medicare Other | Admitting: Physical Therapy

## 2021-09-08 ENCOUNTER — Other Ambulatory Visit: Payer: Self-pay

## 2021-09-08 ENCOUNTER — Telehealth: Payer: Self-pay | Admitting: Family

## 2021-09-08 ENCOUNTER — Encounter: Payer: Self-pay | Admitting: Gastroenterology

## 2021-09-08 MED ORDER — ESOMEPRAZOLE MAGNESIUM 40 MG PO CPDR: DELAYED_RELEASE_CAPSULE | ORAL | 3 refills | Status: DC

## 2021-09-08 NOTE — Telephone Encounter (Signed)
Refill sent.

## 2021-09-08 NOTE — Telephone Encounter (Signed)
Medication:   esomeprazole (NEXIUM) 40 MG capsule [[id number]]   Has the patient contacted their pharmacy? No. (If no, request that the patient contact the pharmacy for the refill.) (If yes, when and what did the pharmacy advise?)  Preferred Pharmacy (with phone number or street name):   [id number] [address] Phone:  [phone] Fax:  [phone]  Agent: Please be advised that RX refills may take up to 3 business days. We ask that you follow-up with your pharmacy.

## 2021-09-09 ENCOUNTER — Ambulatory Visit: Payer: Medicare Other

## 2021-09-15 ENCOUNTER — Encounter: Payer: Self-pay | Admitting: Psychiatry

## 2021-09-15 ENCOUNTER — Ambulatory Visit (INDEPENDENT_AMBULATORY_CARE_PROVIDER_SITE_OTHER): Payer: Medicare Other | Admitting: Psychiatry

## 2021-09-15 VITALS — BP 147/82 | HR 99 | Temp 98.8°F | Wt 247.2 lb

## 2021-09-15 DIAGNOSIS — I10 Essential (primary) hypertension: Secondary | ICD-10-CM

## 2021-09-15 DIAGNOSIS — F203 Undifferentiated schizophrenia: Secondary | ICD-10-CM | POA: Diagnosis not present

## 2021-09-15 DIAGNOSIS — F17200 Nicotine dependence, unspecified, uncomplicated: Secondary | ICD-10-CM | POA: Diagnosis not present

## 2021-09-15 DIAGNOSIS — F209 Schizophrenia, unspecified: Secondary | ICD-10-CM

## 2021-09-15 DIAGNOSIS — F418 Other specified anxiety disorders: Secondary | ICD-10-CM | POA: Diagnosis not present

## 2021-09-15 MED ORDER — HYDROXYZINE HCL 25 MG PO TABS: 12.5000 mg | ORAL_TABLET | Freq: Every day | ORAL | 0 refills | Status: DC | PRN

## 2021-09-15 MED ORDER — BENZTROPINE MESYLATE 0.5 MG PO TABS: 0.5000 mg | ORAL_TABLET | Freq: Two times a day (BID) | ORAL | 0 refills | Status: DC

## 2021-09-15 NOTE — Progress Notes (Unsigned)
[facility] MD OP Progress Note  09/15/2021 3:34 PM [name]  MRN:  [id number]  Chief Complaint:  Chief Complaint  Patient presents with   Follow-up: 66 year old African-American male, married, has a history of schizophrenia, anxiety disorder unspecified, multiple medical problems presented for medication management.   HPI: [name] is a 67 year old African-American male on disability, lives in [place] with his wife, has a history of schizophrenia, essential hypertension, coronary artery disease, prostate cancer, chronic pain, history of multiple surgeries, presented for follow-up appointment.  Patient today reports he is currently compliant on the Prolixin.  Patient reports hallucinations continues to be in remission.  Does report a history of delusions when he felt he could read other people's mind, currently denies it.  Denies any other perceptual disturbances.  Reports sleep as good.  He does have prostate cancer and has to wake up in the middle of the night to urinate.  However is able to fall back asleep quickly.  Patient is currently trying to cut back on smoking cigarettes.  Does have nicotine gum as available.  Has not been using it.  Agreeable to give it a try again.  Denies suicidality or homicidality.  Reports anxiety symptoms as improving.  Uses the hydroxyzine as needed.  Patient reports appetite is fair.  He is trying to lose weight.  Interested in exercising more.  However does struggle with back pain, neck pain.  Does have exercises that he needs to practice at home.  Currently on Celebrex for pain which helps.  Denies any other concerns today.  Visit Diagnosis:    ICD-10-CM   1. Undifferentiated schizophrenia ([place])  F20.3 benztropine (COGENTIN) 0.5 MG tablet    2. Other specified anxiety disorders  F41.8 hydrOXYzine (ATARAX) 25 MG tablet   generalized anxiety not occurring more days than not    3. Tobacco use disorder  F17.200     4. Elevated blood pressure  reading in office with diagnosis of hypertension  I10       Past Psychiatric History: Reviewed past psychiatric history from progress note on 06/08/2021.  Multiple medication trials including Navane, Haldol, Prolixin.  Multiple inpatient behavioral health admissions in the past.  Past Medical History:  Past Medical History:  Diagnosis Date   ALLERGIC RHINITIS 01/25/2007   ANXIETY DISORDER, GENERALIZED 01/25/2007   Arthritis    Chronic kidney disease    stage 3   COPD (chronic obstructive pulmonary disease) ([place])    DEPRESSION 01/25/2007   ESOPHAGITIS 12/28/2007   Fatty liver 10/09/2010   GERD 01/25/2007   GLUCOSE INTOLERANCE, HX OF 01/25/2007   Heart murmur    hx of    HEMORRHOIDS, INTERNAL, WITH BLEEDING 04/26/2008   Hiatal hernia    HYPERLIPIDEMIA 12/27/2007   HYPERTENSION, ESSENTIAL NOS 01/25/2007   Hyperthyroidism 08/11/2010   HYPERTROPHY PROSTATE W/UR OBST & OTH LUTS 02/27/2010   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 Zella Richer   COLONOSCOPY  04/23/2010   normal   ESOPHAGOGASTRODUODENOSCOPY N/A 12/31/2013   Procedure: ESOPHAGOGASTRODUODENOSCOPY (EGD);  Surgeon: Gatha Mayer, MD;  Location: [facility];  Service: Endoscopy;  Laterality: N/A;   Excision of scalp lesion  07/18/2009   Excision of thyroid mass     GANGLION CYST EXCISION     right foot x 2   HERNIA REPAIR     ventral   INCISIONAL HERNIA REPAIR  12/01/2011   Procedure: LAPAROSCOPIC INCISIONAL HERNIA;  Surgeon: Harl Bowie, MD;  Location: [facility];  Service: General;  Laterality: N/A;   SHOULDER SURGERY      right   UPPER GASTROINTESTINAL ENDOSCOPY      Family Psychiatric History: Reviewed family psychiatric history from progress note on 06/08/2021.  Family History:  Family History  Problem Relation Age of Onset   Hypertension Mother    Bone cancer Mother    Hypertension Father    Kidney disease Father    Bone cancer Brother    Kidney disease Brother    Mental illness Maternal Grandmother    Prostate cancer Other    Mental illness Son    Colon cancer Neg Hx    Colon polyps Neg Hx    Esophageal cancer Neg Hx    Rectal cancer Neg Hx    Stomach cancer Neg Hx     Social History: Reviewed social history from progress note on 06/08/2021. Social History   Socioeconomic History   Marital status: Married    Spouse name: [name]   Number of children: 2   Years of education: Not on file   Highest education level: Not on file  Occupational History   Occupation: Disabled    Employer: DISABLED  Tobacco Use   Smoking status: Every Day    Packs/day: 1.50    Years: 49.00    Pack years: 73.50    Types: Cigarettes    Start date: 1971   Smokeless tobacco: Never   Tobacco comments:    Started smoking at age 79,still smoking pack - 1-1/2 packs  per day 01/08/2021  Vaping Use   Vaping Use: Never used  Substance and Sexual Activity   Alcohol use: No   Drug use: Yes    Frequency: 1.0 times per week    Types: Marijuana    Comment: 01/19/2021   Sexual activity: Yes    Partners: Female  Other Topics Concern   Not on file  Social History Narrative   HSG disabled due to schizophrenia - 1983. stable long-term marriage with children. cared for his father-in-law- passed away in 07/08/22   Social Determinants of Health   Financial Resource Strain: Low Risk    Difficulty of Paying Living Expenses: Not hard at all  Food Insecurity: No Food Insecurity   Worried About Charity fundraiser in the Last Year: Never true   Arboriculturist in the Last Year: Never true  Transportation Needs: No  Transportation Needs   Lack of Transportation (Medical): No   Lack of Transportation (Non-Medical): No  Physical Activity: Inactive   Days of Exercise per Week: 0 days   Minutes of Exercise per Session: 0 min  Stress: No Stress Concern Present   Feeling of Stress : Not at all  Social Connections: Moderately Integrated   Frequency of Communication with Friends and Family: More than three times a week   Frequency of Social Gatherings with Friends and Family: Twice a week   Attends Religious Services: More than 4 times per year   Active Member of [employer] or Organizations: No   Attends Archivist Meetings: Never   Marital Status: Married    Allergies:  Allergies  Allergen Reactions   Celecoxib Other (See Comments)     rectal bleeding   Iohexol Other (See Comments)    Affects nerves: triggers schizophrenia   Ivp Dye [Iodinated Contrast Media]    Metoclopramide Hcl Other (See Comments)    Affects nerves: triggers schizophrenia   Ritalin [Methylphenidate]    Wellbutrin [Bupropion] Other (See Comments)    Makes pt smoke heavily    Metabolic Disorder Labs: Lab Results  Component Value Date   HGBA1C 5.7 08/01/2014   No results found for: PROLACTIN Lab Results  Component Value Date   CHOL 170 10/24/2018   TRIG 148.0 10/24/2018   HDL 41.30 10/24/2018   CHOLHDL 4 10/24/2018   VLDL 29.6 10/24/2018   LDLCALC 99 10/24/2018   LDLCALC 110 (H) 10/19/2017   Lab Results  Component Value Date   TSH 1.73 05/21/2021   TSH 1.440 11/16/2020    Therapeutic Level Labs: No results found for: LITHIUM No results found for: VALPROATE No components found for:  CBMZ  Current Medications: Current Outpatient Medications  Medication Sig Dispense Refill   acetaminophen (TYLENOL) 500 MG tablet Take 1,000 mg by mouth every 6 (six) hours as needed for moderate pain.      albuterol (PROVENTIL) (2.5 MG/3ML) 0.083% nebulizer solution INHALE 3 ML BY NEBULIZATION EVERY 6 HOURS AS NEEDED FOR  WHEEZING OR SHORTNESS OF BREATH 150 mL 1   albuterol (VENTOLIN HFA) 108 (90 Base) MCG/ACT inhaler TAKE 2 PUFFS BY MOUTH EVERY 6 HOURS AS NEEDED FOR WHEEZE OR SHORTNESS OF BREATH 8.5 each 6   amLODipine (NORVASC) 10 MG tablet Take 1 tablet (10 mg total) by mouth daily. 90 tablet 3   aspirin 325 MG tablet Take 325 mg by mouth daily.     Blood Pressure Monitoring (BLOOD PRESSURE CUFF) MISC Use daily as directed to check blood pressure 1 each 0   celecoxib (CELEBREX) 50 MG capsule Take 1 capsule (50 mg total) by mouth 2 (two) times daily as needed for pain. 60 capsule 6   cyclobenzaprine (FLEXERIL) 10 MG tablet TAKE 1 TABLET BY MOUTH EVERYDAY AT BEDTIME 30 tablet 3   diclofenac Sodium (VOLTAREN) 1 % GEL Apply 2 g topically 4 (four) times daily as needed (pain). 150 g 1   esomeprazole (NEXIUM) 40 MG capsule TAKE 1 CAPSULE BY MOUTH EVERY DAY 90 capsule 3   fluPHENAZine (PROLIXIN) 10 MG tablet Take 1 tablet (10 mg total) by mouth daily. 90 tablet 1   fluticasone (FLONASE) 50 MCG/ACT nasal spray SPRAY 2 SPRAYS INTO EACH NOSTRIL EVERY DAY 48 mL 2   furosemide (LASIX) 20 MG tablet Take 1 tablet (20 mg total) by mouth daily as needed for edema. 30 tablet 0   gabapentin (NEURONTIN) 300 MG capsule Take 1 capsule (300 mg total) by mouth 2 (two) times daily. 180 capsule 3   KLOR-CON M20 20 MEQ tablet Take 1 tablet (20 mEq total) by mouth daily. 90 tablet 3   naproxen sodium (ALEVE) 220 MG tablet Take 440 mg by mouth daily as needed (pain).      sildenafil (VIAGRA) 100 MG tablet Take 100 mg by mouth daily as needed.     SYMBICORT 160-4.5 MCG/ACT inhaler INHALE 2 PUFFS INTO THE LUNGS IN THE MORNING AND AT BEDTIME. TAKE 2 PUFFS BY MOUTH TWICE A DAY 30.6 each 1   tamsulosin (FLOMAX) 0.4 MG CAPS capsule Take 0.4 mg by mouth daily.     valsartan (DIOVAN) 160 MG tablet Take 1 tablet (160  mg total) by mouth daily. 90 tablet 3   benztropine (COGENTIN) 0.5 MG tablet Take 1 tablet (0.5 mg total) by mouth 2 (two) times  daily. As needed for side effects of Prolixin 180 tablet 0   hydrOXYzine (ATARAX) 25 MG tablet Take 0.5-1 tablets (12.5-25 mg total) by mouth daily as needed for anxiety. Please limit use 90 tablet 0   No current facility-administered medications for this visit.     Musculoskeletal: Strength & Muscle Tone: within normal limits Gait & Station: normal Patient leans: N/A  Psychiatric Specialty Exam: Review of Systems  Psychiatric/Behavioral:  Negative for agitation, behavioral problems, confusion, decreased concentration, dysphoric mood, hallucinations, self-injury, sleep disturbance and suicidal ideas. The patient is not nervous/anxious and is not hyperactive.   All other systems reviewed and are negative.  Blood pressure (!) 147/82, pulse 99, temperature 98.8 F (37.1 C), temperature source Temporal, weight 247 lb 3.2 oz (112.1 kg).Body mass index is 36.51 kg/m.  General Appearance: Casual  Eye Contact:  Fair  Speech:  Clear and Coherent  Volume:  Normal  Mood:  Euthymic  Affect:  Congruent  Thought Process:  Goal Directed and Descriptions of Associations: Intact  Orientation:  Full (Time, Place, and Person)  Thought Content: Logical   Suicidal Thoughts:  No  Homicidal Thoughts:  No  Memory:  Immediate;   Fair Recent;   Fair Remote;   Fair  Judgement:  Fair  Insight:  Fair  Psychomotor Activity:  Normal  Concentration:  Concentration: Fair and Attention Span: Fair  Recall:  [employer] of Knowledge: Fair  Language: Fair  Akathisia:  No  Handed:  Right  AIMS (if indicated): done  Assets:  Communication Skills Desire for Improvement Housing Social Support  ADL's:  Intact  Cognition: WNL  Sleep:  Fair   Screenings: [facility] [place] Office Visit from 09/15/2021 in [facility] Office Visit from 06/08/2021 in [facility] Total Score 0 0      GAD-7    [place] Visit from 06/08/2021 in  [facility]  Total GAD-7 Score 0      PHQ2-9    [place] Visit from 09/15/2021 in [facility] from 08/04/2021 in [facility] [place] Visit from 06/08/2021 in [facility] Visit from 11/20/2020 in [facility] at [place] Visit from 08/22/2020 in [facility] at [place]  PHQ-2 Total Score 0 0 0 0 0  PHQ-9 Total Score -- -- 0 -- --      [facility] ED from 05/27/2021 in [facility] from 11/16/2020 in [facility] at [place] No Risk No Risk        Assessment and Plan: Ramces Shomaker is a 66 year old African-American male, on disability, history of schizophrenia, hypertension, coronary artery disease, prostate cancer, chronic pain, married, lives in [place] was evaluated in office today for a follow-up appointment.  Patient is currently stable.  Plan Schizophrenia-stable Prolixin 10 mg p.o. daily Benztropine 0.5 mg p.o. twice daily as needed  Other specified anxiety disorder-generalized anxiety not occurring more days than not-improving Hydroxyzine 12.5-25 mg daily as needed for anxiety attacks.  Tobacco use disorder-unstable Provided counseling for 2 minutes.  Patient does have nicotine gum as available.  Agreeable to start using it.  Elevated  blood pressure reading-unstable Blood pressure was repeated, downtrending in session today.  Advised to monitor closely and follow up with primary care provider as needed  Follow-up in clinic in 3 months or sooner if needed.  This note was generated in part or whole with voice recognition software. Voice recognition is usually quite accurate but there are transcription errors that can and very often do occur. I apologize for any typographical  errors that were not detected and corrected.     Ursula Alert, MD 09/16/2021, 12:59 PM

## 2021-09-16 ENCOUNTER — Ambulatory Visit: Payer: Medicare Other

## 2021-09-18 ENCOUNTER — Ambulatory Visit: Payer: Medicare Other

## 2021-09-21 ENCOUNTER — Ambulatory Visit: Payer: Medicare Other

## 2021-09-21 ENCOUNTER — Other Ambulatory Visit: Payer: Self-pay | Admitting: Thoracic Surgery (Cardiothoracic Vascular Surgery)

## 2021-09-21 DIAGNOSIS — R911 Solitary pulmonary nodule: Secondary | ICD-10-CM

## 2021-09-23 ENCOUNTER — Ambulatory Visit: Payer: Medicare Other | Admitting: Physical Therapy

## 2021-09-24 ENCOUNTER — Other Ambulatory Visit: Payer: Self-pay | Admitting: Family

## 2021-09-30 ENCOUNTER — Ambulatory Visit (AMBULATORY_SURGERY_CENTER): Payer: Self-pay | Admitting: *Deleted

## 2021-09-30 VITALS — Ht 69.0 in | Wt 242.0 lb

## 2021-09-30 DIAGNOSIS — Z1211 Encounter for screening for malignant neoplasm of colon: Secondary | ICD-10-CM

## 2021-09-30 MED ORDER — PEG 3350-KCL-NA BICARB-NACL 420 G PO SOLR: 4000.0000 mL | Freq: Once | ORAL | 0 refills | Status: AC

## 2021-09-30 NOTE — Progress Notes (Signed)
Patient and wife is here in-person for PV. Patient denies any allergies to eggs or soy. Patient denies any problems with anesthesia/sedation. Patient is not on any oxygen at home. Patient is not taking any diet/weight loss medications or blood thinners. Patient is aware of our care-partner policy. Patient notified to use Good-Rx for prescription.   EMMI education assigned to the patient for the procedure, sent to [facility].

## 2021-10-22 ENCOUNTER — Encounter: Payer: Self-pay | Admitting: Gastroenterology

## 2021-10-28 ENCOUNTER — Encounter: Payer: Self-pay | Admitting: Gastroenterology

## 2021-10-28 ENCOUNTER — Ambulatory Visit (AMBULATORY_SURGERY_CENTER): Payer: Medicare Other | Admitting: Gastroenterology

## 2021-10-28 VITALS — BP 145/85 | HR 82 | Temp 98.0°F | Resp 18 | Ht 69.0 in | Wt 242.0 lb

## 2021-10-28 DIAGNOSIS — K621 Rectal polyp: Secondary | ICD-10-CM

## 2021-10-28 DIAGNOSIS — D122 Benign neoplasm of ascending colon: Secondary | ICD-10-CM

## 2021-10-28 DIAGNOSIS — Z1211 Encounter for screening for malignant neoplasm of colon: Secondary | ICD-10-CM | POA: Diagnosis not present

## 2021-10-28 DIAGNOSIS — D128 Benign neoplasm of rectum: Secondary | ICD-10-CM

## 2021-10-28 NOTE — Progress Notes (Signed)
History and Physical:  This patient presents for endoscopic testing for: Encounter Diagnosis  Name Primary?   Special screening for malignant neoplasms, colon Yes    66 year old man with colon cancer screening.  Plan for screening colonoscopy October 2022, but procedure aborted for poor preparation. Denies chronic constipation, recent change in bowel habits or rectal bleeding.  Patient is otherwise without complaints or active issues today.   Past Medical History: Past Medical History:  Diagnosis Date   ALLERGIC RHINITIS 01/25/2007   ANXIETY DISORDER, GENERALIZED 01/25/2007   Arthritis    Chronic kidney disease    stage 3   COPD (chronic obstructive pulmonary disease) ([place])    DEPRESSION 01/25/2007   ESOPHAGITIS 12/28/2007   Fatty liver 10/09/2010   GERD 01/25/2007   GLUCOSE INTOLERANCE, HX OF 01/25/2007   Heart murmur    hx of    HEMORRHOIDS, INTERNAL, WITH BLEEDING 04/26/2008   Hiatal hernia    HYPERLIPIDEMIA 12/27/2007   HYPERTENSION, ESSENTIAL NOS 01/25/2007   Hyperthyroidism 08/11/2010   HYPERTROPHY PROSTATE W/UR OBST & OTH LUTS 02/27/2010   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 Zella Richer   COLONOSCOPY  04/23/2010   normal   COLONOSCOPY WITH PROPOFOL  01/22/2021   poor prep[   ESOPHAGOGASTRODUODENOSCOPY N/A 12/31/2013   Procedure: ESOPHAGOGASTRODUODENOSCOPY (EGD);  Surgeon: Gatha Mayer, MD;  Location: [facility];  Service: Endoscopy;  Laterality: N/A;   Excision of scalp lesion  07/18/2009   Excision of thyroid mass     GANGLION CYST  EXCISION     right foot x 2   HERNIA REPAIR     ventral   INCISIONAL HERNIA REPAIR  12/01/2011   Procedure: LAPAROSCOPIC INCISIONAL HERNIA;  Surgeon: Harl Bowie, MD;  Location: [facility];  Service: General;  Laterality: N/A;   SHOULDER SURGERY     right   UPPER GASTROINTESTINAL ENDOSCOPY      Allergies: Allergies  Allergen Reactions   Celecoxib Other (See Comments)     rectal bleeding   Iohexol Other (See Comments)    Affects nerves: triggers schizophrenia   Ivp Dye [Iodinated Contrast Media]    Metoclopramide Hcl Other (See Comments)    Affects nerves: triggers schizophrenia   Ritalin [Methylphenidate]    Wellbutrin [Bupropion] Other (See Comments)    Makes pt smoke heavily    Outpatient Meds: Current Outpatient Medications  Medication Sig Dispense Refill   albuterol (PROVENTIL) (2.5 MG/3ML) 0.083% nebulizer solution INHALE 3 ML BY NEBULIZATION EVERY 6 HOURS AS NEEDED FOR WHEEZING OR SHORTNESS OF BREATH 150 mL 1   albuterol (VENTOLIN HFA) 108 (90 Base) MCG/ACT inhaler TAKE 2 PUFFS BY MOUTH EVERY 6 HOURS AS NEEDED FOR WHEEZE OR SHORTNESS OF BREATH 8.5 each 6   amLODipine (NORVASC) 10 MG tablet Take 1 tablet (10 mg total) by mouth daily. 90 tablet 3   aspirin 325 MG tablet Take 325 mg by mouth daily.     benztropine (COGENTIN) 0.5 MG tablet Take 1 tablet (0.5 mg total) by mouth 2 (two) times daily. As needed for side effects of Prolixin  180 tablet 0   celecoxib (CELEBREX) 50 MG capsule Take 1 capsule (50 mg total) by mouth 2 (two) times daily as needed for pain. 60 capsule 6   Cyanocobalamin (VITAMIN B-12 PO) Take by mouth.     cyclobenzaprine (FLEXERIL) 10 MG tablet TAKE 1 TABLET BY MOUTH EVERYDAY AT BEDTIME 30 tablet 3   diclofenac Sodium (VOLTAREN) 1 % GEL Apply 2 g topically 4 (four) times daily as needed (pain). 150 g 1   esomeprazole (NEXIUM) 40 MG capsule TAKE 1 CAPSULE BY MOUTH EVERY DAY 90 capsule 3   fluPHENAZine (PROLIXIN) 10 MG tablet Take 1 tablet (10 mg total)  by mouth daily. 90 tablet 1   gabapentin (NEURONTIN) 300 MG capsule Take 1 capsule (300 mg total) by mouth 2 (two) times daily. 180 capsule 3   hydrOXYzine (ATARAX) 25 MG tablet Take 0.5-1 tablets (12.5-25 mg total) by mouth daily as needed for anxiety. Please limit use 90 tablet 0   KLOR-CON M20 20 MEQ tablet TAKE 1 TABLET BY MOUTH EVERY DAY 90 tablet 3   SYMBICORT 160-4.5 MCG/ACT inhaler INHALE 2 PUFFS INTO THE LUNGS IN THE MORNING AND AT BEDTIME. TAKE 2 PUFFS BY MOUTH TWICE A DAY 30.6 each 1   tamsulosin (FLOMAX) 0.4 MG CAPS capsule Take 0.4 mg by mouth daily.     valsartan (DIOVAN) 160 MG tablet Take 1 tablet (160 mg total) by mouth daily. 90 tablet 3   VITAMIN D PO Take by mouth.     acetaminophen (TYLENOL) 500 MG tablet Take 1,000 mg by mouth every 6 (six) hours as needed for moderate pain.      Blood Pressure Monitoring (BLOOD PRESSURE CUFF) MISC Use daily as directed to check blood pressure 1 each 0   fluticasone (FLONASE) 50 MCG/ACT nasal spray SPRAY 2 SPRAYS INTO EACH NOSTRIL EVERY DAY 48 mL 2   naproxen sodium (ALEVE) 220 MG tablet Take 440 mg by mouth daily as needed (pain).      sildenafil (VIAGRA) 100 MG tablet Take 100 mg by mouth daily as needed.     No current facility-administered medications for this visit.      ___________________________________________________________________ Objective   Exam:  BP 127/72   Pulse 92   Temp 98 F (36.7 C)   Ht '5\' 9"'$  (1.753 m)   Wt 242 lb (109.8 kg)   SpO2 96%   BMI 35.74 kg/m   CV: RRR without murmur, S1/S2 Resp: clear to auscultation bilaterally, normal RR and effort noted GI: soft, no tenderness, with active bowel sounds.   Assessment: Encounter Diagnosis  Name Primary?   Special screening for malignant neoplasms, colon Yes     Plan: Colonoscopy  The benefits and risks of the planned procedure were described in detail with the patient or (when appropriate) their health care proxy.  Risks were outlined as  including, but not limited to, bleeding, infection, perforation, adverse medication reaction leading to cardiac or pulmonary decompensation, pancreatitis (if ERCP).  The limitation of incomplete mucosal visualization was also discussed.  No guarantees or warranties were given.    The patient is appropriate for an endoscopic procedure in the ambulatory setting.   - Wilfrid Lund, MD

## 2021-10-28 NOTE — Progress Notes (Signed)
Pt's states no medical or surgical changes since previsit or office visit. 

## 2021-10-28 NOTE — Progress Notes (Signed)
Pt non-responsive, VVS, Report to RN  °

## 2021-10-28 NOTE — Op Note (Signed)
[facility] Patient Name: [name] Procedure Date: 10/28/2021 10:24 AM MRN: [id number] Endoscopist: [place]. Loletha Carrow , MD Age: 66 Referring MD:  Date of Birth: 06/10/1955 Gender: Male Account #: [id number] Procedure:                Colonoscopy Indications:              Screening for colorectal malignant neoplasm,                           (aborted colonoscopy attempt Oct 2022 due to poor                            prep)                           No polyps 2012 Medicines:                Monitored Anesthesia Care Procedure:                Pre-Anesthesia Assessment:                           - Prior to the procedure, a History and Physical                            was performed, and patient medications and                            allergies were reviewed. The patient's tolerance of                            previous anesthesia was also reviewed. The risks                            and benefits of the procedure and the sedation                            options and risks were discussed with the patient.                            All questions were answered, and informed consent                            was obtained. Prior Anticoagulants: The patient has                            taken no previous anticoagulant or antiplatelet                            agents. ASA Grade Assessment: III - A patient with                            severe systemic disease. After reviewing the risks  and benefits, the patient was deemed in                            satisfactory condition to undergo the procedure.                           After obtaining informed consent, the colonoscope                            was passed under direct vision. Throughout the                            procedure, the patient's blood pressure, pulse, and                            oxygen saturations were monitored continuously. The                            CF [id number]  #[id number] was introduced through the anus                            and advanced to the the cecum, identified by                            appendiceal orifice and ileocecal valve. The                            colonoscopy was performed with difficulty due to a                            redundant colon and significant looping. Successful                            completion of the procedure was aided by using                            manual pressure and straightening and shortening                            the scope to obtain bowel loop reduction. The                            patient tolerated the procedure well. The quality                            of the bowel preparation was generally good (some                            scatered fibrous debris that could not be                            completely cleared). The ileocecal valve,  appendiceal orifice, and rectum were photographed.                            The bowel preparation used was 2 day Suprep/Miralax. Scope In: 10:43:30 AM Scope Out: 11:04:27 AM Scope Withdrawal Time: 0 hours 13 minutes 25 seconds  Total Procedure Duration: 0 hours 20 minutes 57 seconds  Findings:                 The perianal and digital rectal examinations were                            normal.                           Repeat examination of right colon under NBI                            performed.                           A 5 mm polyp was found in the distal ascending                            colon. The polyp was flat. The polyp was removed                            with a cold snare. Resection and retrieval were                            complete.                           Repeat examination of right colon under NBI                            performed.                           The colon (entire examined portion) was                            significantly redundant.                           A 2 mm polyp was  found in the rectum. The polyp was                            flat. The polyp was removed with a cold biopsy                            forceps. Resection and retrieval were complete.                           Internal hemorrhoids were found.  Retroflexion in the rectum was not performed due to                            anatomy.                           The exam was otherwise without abnormality. Complications:            No immediate complications. Estimated Blood Loss:     Estimated blood loss was minimal. Impression:               - One 5 mm polyp in the distal ascending colon,                            removed with a cold snare. Resected and retrieved.                           - Redundant colon.                           - One 2 mm polyp in the rectum, removed with a cold                            biopsy forceps. Resected and retrieved.                           - Internal hemorrhoids.                           - The examination was otherwise normal. Recommendation:           - Patient has a contact number available for                            emergencies. The signs and symptoms of potential                            delayed complications were discussed with the                            patient. Return to normal activities tomorrow.                            Written discharge instructions were provided to the                            patient.                           - Resume previous diet.                           - Continue present medications.                           - Await pathology results.                           -  Repeat colonoscopy is recommended for                            surveillance. The colonoscopy date will be                            determined after pathology results from today's                            exam become available for review. Hae Ahlers L. Loletha Carrow, MD 10/28/2021 11:10:53 AM This report has been signed  electronically.

## 2021-10-28 NOTE — Progress Notes (Signed)
Called to room to assist during endoscopic procedure.  Patient ID and intended procedure confirmed with present staff. Received instructions for my participation in the procedure from the performing physician.  

## 2021-10-28 NOTE — Patient Instructions (Signed)
Handouts provided on polyps and hemorrhoids.   YOU HAD AN ENDOSCOPIC PROCEDURE TODAY AT THE [facility]:   Refer to the procedure report that was given to you for any specific questions about what was found during the examination.  If the procedure report does not answer your questions, please call your gastroenterologist to clarify.  If you requested that your care partner not be given the details of your procedure findings, then the procedure report has been included in a sealed envelope for you to review at your convenience later.  YOU SHOULD EXPECT: Some feelings of bloating in the abdomen. Passage of more gas than usual.  Walking can help get rid of the air that was put into your GI tract during the procedure and reduce the bloating. If you had a lower endoscopy (such as a colonoscopy or flexible sigmoidoscopy) you may notice spotting of blood in your stool or on the toilet paper. If you underwent a bowel prep for your procedure, you may not have a normal bowel movement for a few days.  Please Note:  You might notice some irritation and congestion in your nose or some drainage.  This is from the oxygen used during your procedure.  There is no need for concern and it should clear up in a day or so.  SYMPTOMS TO REPORT IMMEDIATELY:  Following lower endoscopy (colonoscopy or flexible sigmoidoscopy):  Excessive amounts of blood in the stool  Significant tenderness or worsening of abdominal pains  Swelling of the abdomen that is new, acute  Fever of 100F or higher  For urgent or emergent issues, a gastroenterologist can be reached at any hour by calling [phone]. Do not use MyChart messaging for urgent concerns.    DIET:  We do recommend a small meal at first, but then you may proceed to your regular diet.  Drink plenty of fluids but you should avoid alcoholic beverages for 24 hours.  ACTIVITY:  You should plan to take it easy for the rest of today and you should NOT DRIVE  or use heavy machinery until tomorrow (because of the sedation medicines used during the test).    FOLLOW UP: Our staff will call the number listed on your records the next business day following your procedure.  We will call around 7:15- 8:00 am to check on you and address any questions or concerns that you may have regarding the information given to you following your procedure. If we do not reach you, we will leave a message.  If you develop any symptoms (ie: fever, flu-like symptoms, shortness of breath, cough etc.) before then, please call [phone].  If you test positive for Covid 19 in the 2 weeks post procedure, please call and report this information to us.    If any biopsies were taken you will be contacted by phone or by letter within the next 1-3 weeks.  Please call us at [phone] if you have not heard about the biopsies in 3 weeks.    SIGNATURES/CONFIDENTIALITY: You and/or your care partner have signed paperwork which will be entered into your electronic medical record.  These signatures attest to the fact that that the information above on your After Visit Summary has been reviewed and is understood.  Full responsibility of the confidentiality of this discharge information lies with you and/or your care-partner.  

## 2021-10-29 ENCOUNTER — Telehealth: Payer: Self-pay

## 2021-10-29 ENCOUNTER — Ambulatory Visit (INDEPENDENT_AMBULATORY_CARE_PROVIDER_SITE_OTHER): Payer: Medicare Other | Admitting: Orthopedic Surgery

## 2021-10-29 DIAGNOSIS — M65331 Trigger finger, right middle finger: Secondary | ICD-10-CM

## 2021-10-29 MED ORDER — BETAMETHASONE SOD PHOS & ACET 6 (3-3) MG/ML IJ SUSP
6.0000 mg | INTRAMUSCULAR | Status: AC | PRN
  Administered 2021-10-29: 6 mg via INTRA_ARTICULAR

## 2021-10-29 MED ORDER — LIDOCAINE HCL 1 % IJ SOLN
1.0000 mL | INTRAMUSCULAR | Status: AC | PRN
  Administered 2021-10-29: 1 mL

## 2021-10-29 NOTE — Progress Notes (Signed)
Office Visit Note   Patient: [name]           Date of Birth: 04-Nov-1955           MRN: [id number] Visit Date: 10/29/2021              Requested by: Marrian Salvage, [address] [place],  [place] [address] PCP: Marrian Salvage, FNP   Assessment & Plan: Visit Diagnoses:  1. Trigger finger, right middle finger     Plan: Patient presents with recurrent right middle triggering.  He underwent corticosteroid injection on 06/02/2021 with around 4 months of significant symptom relief.  He would like a repeat injection today.  Discussed that this were to fail, our next step would likely be A1 pulley release.  He is in agreement and will see me again as needed.  Follow-Up Instructions: No follow-ups on file.   Orders:  No orders of the defined types were placed in this encounter.  No orders of the defined types were placed in this encounter.     Procedures: Hand/UE Inj: R long A1 for trigger finger on 10/29/2021 1:29 PM Indications: tendon swelling and therapeutic Details: 25 G needle, volar approach Medications: 1 mL lidocaine 1 %; 6 mg betamethasone acetate-betamethasone sodium phosphate 6 (3-3) MG/ML Outcome: tolerated well, no immediate complications Procedure, treatment alternatives, risks and benefits explained, specific risks discussed. Consent was given by the patient. Patient was prepped and draped in the usual sterile fashion.       Clinical Data: No additional findings.   Subjective: Chief Complaint  Patient presents with   Right Middle Finger - Follow-up    Would like another injection in it    This is a 66 year old right-hand-dominant male who presents for follow-up of right middle finger triggering.  He was last seen in the mid February at which time he underwent corticosteroid injection into the right middle finger A1 pulley.  He had significant relief for at least 4 months.  The triggering has returned over the last  several weeks.  It started to bother him again.  He is able to actively unlock the finger from a flexed position.  He denies issue elsewhere in the hand or other triggering.    Review of Systems   Objective: Vital Signs: There were no vitals taken for this visit.  Physical Exam Constitutional:      Appearance: Normal appearance.  Cardiovascular:     Rate and Rhythm: Normal rate.     Pulses: Normal pulses.  Pulmonary:     Effort: Pulmonary effort is normal.  Skin:    General: Skin is warm and dry.     Capillary Refill: Capillary refill takes less than 2 seconds.  Neurological:     Mental Status: He is alert.     Right Hand Exam   Tenderness  Right hand tenderness location: TTP over middle finger A1 pulley.  Other  Erythema: absent Sensation: normal Pulse: present  Comments:  Palpable and visible triggering of right middle finger.       Specialty Comments:  No specialty comments available.  Imaging: No results found.   PMFS History: Patient Active Problem List   Diagnosis Date Noted   Paresthesia 08/26/2021   Ulnar neuropathy at elbow, left 08/26/2021   Gait abnormality 07/14/2021   Chronic bilateral low back pain with bilateral sciatica 07/14/2021   Neck pain 07/14/2021   Left hand weakness 07/14/2021   Trigger finger, right middle  finger 06/02/2021   Coronary artery calcification 08/15/2020   Nodule of apex of right lung 05/29/2020   Malignant neoplasm of prostate ([place]) 10/26/2019   Body mass index (BMI) 25.0-25.9, adult 10/25/2019   Spondylosis of cervical region without myelopathy or radiculopathy 09/26/2019   Numbness of hand 06/22/2019   Ulnar neuropathy at elbow of left upper extremity 06/05/2019   Chronic obstructive pulmonary disease ([place]) 10/24/2018   Cervical radiculopathy at C8 01/18/2018   Right lateral epicondylitis 12/21/2017   Pain in joint, shoulder region 93/79/0240   Periumbilical abdominal pain 02/13/2015   Elevated PSA  08/04/2014   Subacromial bursitis 04/11/2014   Neck pain on right side 03/05/2014   Bilateral shoulder pain 03/05/2014   Recurrent boils 03/05/2014   Foreign body in stomach 12/31/2013   Reflux esophagitis 12/31/2013   Weight loss 05/06/2012   Incisional hernia 11/23/2011   Cramp of limb 01/13/2011   Hyperthyroidism 08/11/2010   Sebaceous cyst 08/10/2010   HYPERTROPHY PROSTATE W/UR OBST & OTH LUTS 02/27/2010   Tobacco use disorder 08/05/2009   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   HEMORRHOIDS, INTERNAL, WITH BLEEDING 04/26/2008   Hyperlipidemia 12/27/2007   LOW BACK PAIN, CHRONIC 11/08/2007   Schizophrenia ([place]) 01/25/2007   Anxiety disorder 01/25/2007   DEPRESSION 01/25/2007   Elevated blood pressure reading in office with diagnosis of hypertension 01/25/2007   ALLERGIC RHINITIS 01/25/2007   SCOLIOSIS NEC 01/25/2007   Impaired glucose tolerance 01/25/2007   Past Medical History:  Diagnosis Date   ALLERGIC RHINITIS 01/25/2007   ANXIETY DISORDER, GENERALIZED 01/25/2007   Arthritis    Chronic kidney disease    stage 3   COPD (chronic obstructive pulmonary disease) ([place])    DEPRESSION 01/25/2007   ESOPHAGITIS 12/28/2007   Fatty liver 10/09/2010   GERD 01/25/2007   GLUCOSE INTOLERANCE, HX OF 01/25/2007   Heart murmur    hx of    HEMORRHOIDS, INTERNAL, WITH BLEEDING 04/26/2008   Hiatal hernia    HYPERLIPIDEMIA 12/27/2007   HYPERTENSION, ESSENTIAL NOS 01/25/2007   Hyperthyroidism 08/11/2010   HYPERTROPHY PROSTATE W/UR OBST & OTH LUTS 02/27/2010   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   LOW BACK PAIN, CHRONIC 11/08/2007   Pancreatitis    Prostate cancer ([place])    Rectal abscess    SCHIZOPHRENIA NEC, CHRONIC 01/25/2007   SCOLIOSIS NEC 01/25/2007   Sebaceous cyst 08/10/2010   SMOKER 08/05/2009   UPPER GASTROINTESTINAL HEMORRHAGE 01/25/2007    Family History  Problem Relation Age of Onset   Hypertension Mother    Bone cancer Mother    Hypertension Father     Kidney disease Father    Prostate cancer Brother    Bone cancer Brother    Kidney disease Brother    Mental illness Maternal Grandmother    Mental illness Son    Prostate cancer Other    Colon cancer Neg Hx    Colon polyps Neg Hx    Esophageal cancer Neg Hx    Rectal cancer Neg Hx    Stomach cancer Neg Hx     Past Surgical History:  Procedure Laterality Date   ABDOMINAL EXPLORATION SURGERY  04/19/1986   CHOLECYSTECTOMY  05/20/2008   Dr. Zella Richer   COLONOSCOPY  04/23/2010   normal   COLONOSCOPY WITH PROPOFOL  01/22/2021   poor prep[   ESOPHAGOGASTRODUODENOSCOPY N/A 12/31/2013   Procedure: ESOPHAGOGASTRODUODENOSCOPY (EGD);  Surgeon: Gatha Mayer, MD;  Location: [facility];  Service: Endoscopy;  Laterality: N/A;   Excision of scalp lesion  07/18/2009  Excision of thyroid mass     GANGLION CYST EXCISION     right foot x 2   HERNIA REPAIR     ventral   INCISIONAL HERNIA REPAIR  12/01/2011   Procedure: LAPAROSCOPIC INCISIONAL HERNIA;  Surgeon: Harl Bowie, MD;  Location: [facility];  Service: General;  Laterality: N/A;   SHOULDER SURGERY     right   UPPER GASTROINTESTINAL ENDOSCOPY     Social History   Occupational History   Occupation: Disabled    Employer: DISABLED  Tobacco Use   Smoking status: Every Day    Packs/day: 1.50    Years: 49.00    Total pack years: 73.50    Types: Cigarettes    Start date: 1971   Smokeless tobacco: Never   Tobacco comments:    Started smoking at age 63,still smoking pack - 1-1/2 packs  per day 01/08/2021  Vaping Use   Vaping Use: Never used  Substance and Sexual Activity   Alcohol use: No   Drug use: Yes    Frequency: 1.0 times per week    Types: Marijuana    Comment: last used 10/23/21   Sexual activity: Yes    Partners: Female

## 2021-10-29 NOTE — Telephone Encounter (Signed)
No answer on follow up call. 

## 2021-11-03 ENCOUNTER — Emergency Department (HOSPITAL_COMMUNITY): Payer: Medicare Other

## 2021-11-03 ENCOUNTER — Other Ambulatory Visit: Payer: Self-pay

## 2021-11-03 ENCOUNTER — Emergency Department (HOSPITAL_COMMUNITY)
Admission: EM | Admit: 2021-11-03 | Discharge: 2021-11-04 | Disposition: A | Discharge status: Home / Self Care | Payer: Medicare Other | Attending: Emergency Medicine | Admitting: Emergency Medicine

## 2021-11-03 ENCOUNTER — Encounter (HOSPITAL_COMMUNITY): Payer: Self-pay

## 2021-11-03 ENCOUNTER — Ambulatory Visit
Admission: EM | Admit: 2021-11-03 | Discharge: 2021-11-03 | Disposition: A | Discharge status: Home / Self Care | Payer: Medicare Other | Attending: Physician Assistant | Admitting: Physician Assistant

## 2021-11-03 ENCOUNTER — Encounter: Payer: Self-pay | Admitting: Emergency Medicine

## 2021-11-03 DIAGNOSIS — D72829 Elevated white blood cell count, unspecified: Secondary | ICD-10-CM | POA: Insufficient documentation

## 2021-11-03 DIAGNOSIS — Z79899 Other long term (current) drug therapy: Secondary | ICD-10-CM | POA: Insufficient documentation

## 2021-11-03 DIAGNOSIS — R112 Nausea with vomiting, unspecified: Secondary | ICD-10-CM | POA: Insufficient documentation

## 2021-11-03 DIAGNOSIS — R109 Unspecified abdominal pain: Secondary | ICD-10-CM | POA: Insufficient documentation

## 2021-11-03 DIAGNOSIS — K449 Diaphragmatic hernia without obstruction or gangrene: Secondary | ICD-10-CM | POA: Diagnosis not present

## 2021-11-03 DIAGNOSIS — Z7951 Long term (current) use of inhaled steroids: Secondary | ICD-10-CM | POA: Diagnosis not present

## 2021-11-03 DIAGNOSIS — K5730 Diverticulosis of large intestine without perforation or abscess without bleeding: Secondary | ICD-10-CM | POA: Diagnosis not present

## 2021-11-03 DIAGNOSIS — I10 Essential (primary) hypertension: Secondary | ICD-10-CM | POA: Insufficient documentation

## 2021-11-03 DIAGNOSIS — J449 Chronic obstructive pulmonary disease, unspecified: Secondary | ICD-10-CM | POA: Insufficient documentation

## 2021-11-03 LAB — COMPREHENSIVE METABOLIC PANEL
ALT: 22 U/L (ref 0–44)
AST: 16 U/L (ref 15–41)
Albumin: 4.1 g/dL (ref 3.5–5.0)
Alkaline Phosphatase: 80 U/L (ref 38–126)
Anion gap: 11 (ref 5–15)
BUN: 14 mg/dL (ref 8–23)
CO2: 26 mmol/L (ref 22–32)
Calcium: 9.7 mg/dL (ref 8.9–10.3)
Chloride: 100 mmol/L (ref 98–111)
Creatinine, Ser: 1.25 mg/dL — ABNORMAL HIGH (ref 0.61–1.24)
GFR, Estimated: >60 mL/min (ref >60)
Glucose, Bld: 113 mg/dL — ABNORMAL HIGH (ref 70–99)
Potassium: 3.1 mmol/L — ABNORMAL LOW (ref 3.5–5.1)
Sodium: 137 mmol/L (ref 135–145)
Total Bilirubin: 0.8 mg/dL (ref 0.3–1.2)
Total Protein: 8.2 g/dL — ABNORMAL HIGH (ref 6.5–8.1)

## 2021-11-03 LAB — CBC WITH DIFFERENTIAL/PLATELET
Abs Immature Granulocytes: 0.09 10*3/uL — ABNORMAL HIGH (ref 0.00–0.07)
Basophils Absolute: 0.1 10*3/uL (ref 0.0–0.1)
Basophils Relative: 0 %
Eosinophils Absolute: 0 10*3/uL (ref 0.0–0.5)
Eosinophils Relative: 0 %
HCT: 49 % (ref 39.0–52.0)
Hemoglobin: 16.9 g/dL (ref 13.0–17.0)
Immature Granulocytes: 1 %
Lymphocytes Relative: 18 %
Lymphs Abs: 2.9 10*3/uL (ref 0.7–4.0)
MCH: 29.2 pg (ref 26.0–34.0)
MCHC: 34.5 g/dL (ref 30.0–36.0)
MCV: 84.6 fL (ref 80.0–100.0)
Monocytes Absolute: 1.3 10*3/uL — ABNORMAL HIGH (ref 0.1–1.0)
Monocytes Relative: 8 %
Neutro Abs: 11.2 10*3/uL — ABNORMAL HIGH (ref 1.7–7.7)
Neutrophils Relative %: 73 %
Platelets: 358 10*3/uL (ref 150–400)
RBC: 5.79 MIL/uL (ref 4.22–5.81)
RDW: 14.5 % (ref 11.5–15.5)
WBC: 15.5 10*3/uL — ABNORMAL HIGH (ref 4.0–10.5)
nRBC: 0 % (ref 0.0–0.2)

## 2021-11-03 LAB — LIPASE, BLOOD: Lipase: 24 U/L (ref 11–51)

## 2021-11-03 MED ORDER — ONDANSETRON 8 MG PO TBDP
8.0000 mg | ORAL_TABLET | Freq: Once | ORAL | Status: AC
  Administered 2021-11-03: 8 mg via ORAL

## 2021-11-03 MED ORDER — LIDOCAINE VISCOUS HCL 2 % MT SOLN
OROMUCOSAL | Status: AC
  Filled 2021-11-03: qty 15

## 2021-11-03 MED ORDER — OXYCODONE-ACETAMINOPHEN 5-325 MG PO TABS
1.0000 | ORAL_TABLET | Freq: Once | ORAL | Status: AC
  Administered 2021-11-03: 1 via ORAL

## 2021-11-03 MED ORDER — MAGNESIUM HYDROXIDE 400 MG/5ML PO SUSP
ORAL | Status: AC
  Filled 2021-11-03: qty 30

## 2021-11-03 MED ORDER — OXYCODONE-ACETAMINOPHEN 5-325 MG PO TABS
ORAL_TABLET | ORAL | Status: AC
  Filled 2021-11-03: qty 1

## 2021-11-03 MED ORDER — ONDANSETRON 4 MG PO TBDP
4.0000 mg | ORAL_TABLET | Freq: Once | ORAL | Status: AC
  Administered 2021-11-03: 4 mg via ORAL

## 2021-11-03 MED ORDER — ALUM & MAG HYDROXIDE-SIMETH 200-200-20 MG/5ML PO SUSP
ORAL | Status: AC
  Filled 2021-11-03: qty 30

## 2021-11-03 MED ORDER — ALUM & MAG HYDROXIDE-SIMETH 200-200-20 MG/5ML PO SUSP
30.0000 mL | Freq: Once | ORAL | Status: AC
  Administered 2021-11-03: 30 mL via ORAL

## 2021-11-03 MED ORDER — ONDANSETRON 8 MG PO TBDP
ORAL_TABLET | ORAL | Status: AC
  Filled 2021-11-03: qty 1

## 2021-11-03 NOTE — ED Provider Notes (Incomplete)
[facility] DEPT Provider Note   CSN: [id number] Arrival date & time: 11/03/21  1941     History {Add pertinent medical, surgical, social history, OB history to HPI:1} Chief Complaint  Patient presents with   Abdominal Pain    [name] is a 66 y.o. male.  HPI  Medical history including GERD, hemorrhoids, hypertension, COPD, schizophrenia, pancreatitis presents with complaints of stomach pain nausea vomiting.  Patient states this all started after he got his colonoscopy 07/12, states that he has been unable to tolerate p.o., he states he has pain mainly in his epigastric region, does not radiate, remains constant, denies melena or hematochezia and denies hematemesis or coffee-ground emesis, states he has had some chills without fevers.  Surgical history includes hernia repair, cholecystectomy, endoscopy as well as colonoscopy.  I reviewed patient's chart colonoscopy is unremarkable, had internal hemorrhoids as well as polyps which were noncancerous, was seen by his gastroenterologist Dr. About 3 years ago has a history of chronic nausea and vomiting,Unclear etiology.   Home Medications Prior to Admission medications   Medication Sig Start Date End Date Taking? Authorizing Provider  acetaminophen (TYLENOL) 500 MG tablet Take 1,000 mg by mouth every 6 (six) hours as needed for moderate pain.     [provider]  albuterol (PROVENTIL) (2.5 MG/3ML) 0.083% nebulizer solution INHALE 3 ML BY NEBULIZATION EVERY 6 HOURS AS NEEDED FOR WHEEZING OR SHORTNESS OF BREATH 03/09/21   Marrian Salvage, FNP  albuterol (VENTOLIN HFA) 108 (90 Base) MCG/ACT inhaler TAKE 2 PUFFS BY MOUTH EVERY 6 HOURS AS NEEDED FOR WHEEZE OR SHORTNESS OF BREATH 07/22/21   Margaretha Seeds, MD  amLODipine (NORVASC) 10 MG tablet Take 1 tablet (10 mg total) by mouth daily. 08/22/20   Marrian Salvage, FNP  aspirin 325 MG tablet Take 325 mg by mouth daily.    [provider]  benztropine (COGENTIN) 0.5 MG tablet Take 1 tablet (0.5 mg total) by mouth 2 (two) times daily. As needed for side effects of Prolixin 09/15/21   Ursula Alert, MD  Blood Pressure Monitoring (BLOOD PRESSURE CUFF) MISC Use daily as directed to check blood pressure 06/01/18   Marrian Salvage, FNP  celecoxib (CELEBREX) 50 MG capsule Take 1 capsule (50 mg total) by mouth 2 (two) times daily as needed for pain. 08/26/21   Marcial Pacas, MD  Cyanocobalamin (VITAMIN B-12 PO) Take by mouth.    [provider]  cyclobenzaprine (FLEXERIL) 10 MG tablet TAKE 1 TABLET BY MOUTH EVERYDAY AT BEDTIME 07/27/21   Marrian Salvage, FNP  diclofenac Sodium (VOLTAREN) 1 % GEL Apply 2 g topically 4 (four) times daily as needed (pain). 08/22/20   Marrian Salvage, FNP  esomeprazole (NEXIUM) 40 MG capsule TAKE 1 CAPSULE BY MOUTH EVERY DAY 09/08/21   Marrian Salvage, FNP  fluPHENAZine (PROLIXIN) 10 MG tablet Take 1 tablet (10 mg total) by mouth daily. 06/08/21   Ursula Alert, MD  fluticasone (FLONASE) 50 MCG/ACT nasal spray SPRAY 2 SPRAYS INTO EACH NOSTRIL EVERY DAY 03/17/21   Marrian Salvage, FNP  gabapentin (NEURONTIN) 300 MG capsule Take 1 capsule (300 mg total) by mouth 2 (two) times daily. 08/22/20   Marrian Salvage, FNP  hydrOXYzine (ATARAX) 25 MG tablet Take 0.5-1 tablets (12.5-25 mg total) by mouth daily as needed for anxiety. Please limit use 09/15/21   Ursula Alert, MD  KLOR-CON M20 20 MEQ tablet TAKE 1 TABLET BY MOUTH EVERY DAY 09/24/21   Valere Dross,  Marvis Repress, FNP  naproxen sodium (ALEVE) 220 MG tablet Take 440 mg by mouth daily as needed (pain).     [provider]  sildenafil (VIAGRA) 100 MG tablet Take 100 mg by mouth daily as needed. 10/28/20   [provider]  SYMBICORT 160-4.5 MCG/ACT inhaler INHALE 2 PUFFS INTO THE LUNGS IN THE MORNING AND AT BEDTIME. TAKE 2 PUFFS BY MOUTH TWICE A DAY 09/24/21   Marrian Salvage, FNP  tamsulosin  (FLOMAX) 0.4 MG CAPS capsule Take 0.4 mg by mouth daily. 09/29/18   [provider]  valsartan (DIOVAN) 160 MG tablet Take 1 tablet (160 mg total) by mouth daily. 08/22/20   Marrian Salvage, FNP  VITAMIN D PO Take by mouth.    [provider]      Allergies    Celecoxib, Iohexol, Ivp dye [iodinated contrast media], Metoclopramide hcl, Ritalin [methylphenidate], and Wellbutrin [bupropion]    Review of Systems   Review of Systems  Constitutional:  Negative for chills and fever.  Respiratory:  Negative for shortness of breath.   Cardiovascular:  Negative for chest pain.  Gastrointestinal:  Positive for abdominal pain, nausea and vomiting.  Neurological:  Negative for headaches.    Physical Exam Updated Vital Signs BP (!) 157/101   Pulse (!) 104   Temp 98.9 F (37.2 C) (Core)   Resp 19   SpO2 100%  Physical Exam Vitals and nursing note reviewed.  Constitutional:      General: He is not in acute distress.    Appearance: He is not ill-appearing.  HENT:     Head: Normocephalic and atraumatic.     Nose: No congestion.  Eyes:     Conjunctiva/sclera: Conjunctivae normal.  Cardiovascular:     Rate and Rhythm: Normal rate and regular rhythm.     Pulses: Normal pulses.     Heart sounds: No murmur heard.    No friction rub. No gallop.  Pulmonary:     Effort: No respiratory distress.     Breath sounds: No wheezing, rhonchi or rales.  Abdominal:     General: There is no distension.     Palpations: Abdomen is soft.     Tenderness: There is abdominal tenderness. There is no right CVA tenderness or left CVA tenderness.     Comments: Abdomen nondistended, ventral hernia present, easily reducible, stomach is soft with very minimal tenderness, mainly around the ventral hernia, no guarding rebound tenderness or peritoneal sign negative Murphy sign McBurney point.  Musculoskeletal:     Right lower leg: No edema.     Left lower leg: No edema.  Skin:    General: Skin  is warm and dry.  Neurological:     Mental Status: He is alert.  Psychiatric:        Mood and Affect: Mood normal.     ED Results / Procedures / Treatments   Labs (all labs ordered are listed, but only abnormal results are displayed) Labs Reviewed  CBC WITH DIFFERENTIAL/PLATELET - Abnormal; Notable for the following components:      Result Value   WBC 15.5 (*)    Neutro Abs 11.2 (*)    Monocytes Absolute 1.3 (*)    Abs Immature Granulocytes 0.09 (*)    All other components within normal limits  COMPREHENSIVE METABOLIC PANEL - Abnormal; Notable for the following components:   Potassium 3.1 (*)    Glucose, Bld 113 (*)    Creatinine, Ser 1.25 (*)    Total Protein  8.2 (*)    All other components within normal limits  LIPASE, BLOOD  URINALYSIS, ROUTINE W REFLEX MICROSCOPIC    EKG None  Radiology CT ABDOMEN PELVIS WO CONTRAST  Result Date: 11/03/2021 CLINICAL DATA:  Abdominal pain, emesis, and nausea following colonoscopy 5 days ago. EXAM: CT ABDOMEN AND PELVIS WITHOUT CONTRAST TECHNIQUE: Multidetector CT imaging of the abdomen and pelvis was performed following the standard protocol without IV contrast. RADIATION DOSE REDUCTION: This exam was performed according to the departmental dose-optimization program which includes automated exposure control, adjustment of the mA and/or kV according to patient size and/or use of iterative reconstruction technique. COMPARISON:  09/12/2016. FINDINGS: Lower chest: The heart is normal in size and there is a small pericardial effusion. Multi-vessel coronary artery calcifications are noted. Minimal atelectasis is noted at the lung bases. Hepatobiliary: No focal liver abnormality is seen. Status post cholecystectomy. No biliary dilatation. Pancreas: Unremarkable. No pancreatic ductal dilatation or surrounding inflammatory changes. Spleen: Normal in size without focal abnormality. Adrenals/Urinary Tract: Adrenal glands are unremarkable. Kidneys are  normal, without renal calculi, focal lesion, or hydronephrosis. Bladder is unremarkable. Stomach/Bowel: There is a small hiatal hernia. The stomach is otherwise within normal limits. No bowel obstruction, free air, or pneumatosis. A few scattered diverticula are present along the colon without evidence of diverticulitis. There is fatty infiltration of the walls of the colon and rectum suggesting chronic inflammatory changes. No surrounding inflammatory changes are identified. Examination of the bowel is limited due to respiratory motion artifact on multiple series. The appendix is not visualized on exam. Vascular/Lymphatic: Aortic atherosclerosis with mild aneurysmal dilatation of the distal abdominal aorta measuring 2.8 cm. No abdominal or pelvic lymphadenopathy by size criteria. Reproductive: The prostate gland is markedly enlarged with a lobular contour. Other: No abdominopelvic ascites. Fat containing ventral abdominal wall hernias are noted. Musculoskeletal: Degenerative changes are present in the thoracolumbar spine. No acute osseous abnormality. IMPRESSION: 1. No acute intra-abdominal process. 2. Diverticulosis without diverticulitis. Chronic inflammatory changes of the walls of the colon and rectum. 3. Enlarged prostate gland. 4. 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 Follow-up is recommended every 5 years for surveillance. 5. Coronary artery calcifications. Electronically Signed   By: Brett Fairy M.D.   On: 11/03/2021 20:23    Procedures Procedures  {Document cardiac monitor, telemetry assessment procedure when appropriate:1}  Medications Ordered in ED Medications  ondansetron (ZOFRAN-ODT) disintegrating tablet 8 mg (has no administration in time range)  oxyCODONE-acetaminophen (PERCOCET/ROXICET) 5-325 MG per tablet 1 tablet (has no administration in time range)  alum & mag hydroxide-simeth (MAALOX/MYLANTA) 200-200-20 MG/5ML suspension  30 mL (has no administration in time range)    ED Course/ Medical Decision Making/ A&P                           Medical Decision Making Risk OTC drugs. Prescription drug management.   This patient presents to the ED for concern of abdominal pain, this involves an extensive number of treatment options, and is a complaint that carries with it a high risk of complications and morbidity.  The differential diagnosis includes bowel perforation, diverticulitis, volvulus, bowel obstruction,     Additional history obtained:  Additional history obtained from N/A External records from outside source obtained and reviewed including urgent care notes, GI notes   Co morbidities that complicate the patient evaluation  Multiple abdominal surgeries  Social Determinants of Health:  Schizophrenia    Lab  Tests:  I Ordered, and personally interpreted labs.  The pertinent results include: CBC shows leukocytosis 15.5, CMP shows potassium 3.1, glucose 113, creatinine 1.25, protein 8.2, lipase 24   Imaging Studies ordered:  I ordered imaging studies including CT abdomen pelvis without contrast I independently visualized and interpreted imaging which showed negative for acute findings, diverticulosis without diverticulitis, chronic inflammatory changes of the colon and rectum, enlarged prostate, 2.8 cm abdominal aortic aneurysm I agree with the radiologist interpretation   Cardiac Monitoring:  The patient was maintained on a cardiac monitor.  I personally viewed and interpreted the cardiac monitored which showed an underlying rhythm of: N/A   Medicines ordered and prescription drug management:  I ordered medication including Zofran, oxycodone, GI cocktail I have reviewed the patients home medicines and have made adjustments as needed  Critical Interventions:  ***   Reevaluation:  Presents with nausea vomiting stomach pain, triage obtain basic lab work-up and imaging which I  personally reviewed, similar findings including white count of 15.5, and a potassium of 3.1, and imaging which was unremarkable, patient benign physical exam, will provide him with pain medication, antiemetics and reassess.    Consultations Obtained:  I requested consultation with the ***,  and discussed lab and imaging findings as well as pertinent plan - they recommend: ***    Test Considered:  ***    Rule out   Low suspicion for pancreatitis as lipase is within normal limits.  Low suspicion for bowel obstruction, perforated bowel, volvulus, complicated diverticulitis, kidney stone, pyelo-, CT imaging is all negative these findings.  I have low suspicion for incarcerated hernia as hernias easily reducible.  I have low suspicion for AAA there is no pulsating mass my exam, low suspicion for dissection as presentation atypical etiology.  Low suspicion for mesenteric ischemia as he is nontoxic-appearing vital signs reassuring would expect significant amount of pain as well as risk factors i.e. atrial fibrillation, or clotting disorders.    Dispostion and problem list  After consideration of the diagnostic results and the patients response to treatment, I feel that the patent would benefit from ***.       {Document critical care time when appropriate:1} {Document review of labs and clinical decision tools ie heart score, Chads2Vasc2 etc:1}  {Document your independent review of radiology images, and any outside records:1} {Document your discussion with family members, caretakers, and with consultants:1} {Document social determinants of health affecting pt's care:1} {Document your decision making why or why not admission, treatments were needed:1} Final Clinical Impression(s) / ED Diagnoses Final diagnoses:  None    Rx / DC Orders ED Discharge Orders     None

## 2021-11-03 NOTE — ED Notes (Signed)
Asked pt for urine pt state he cannot provide it at the moment

## 2021-11-03 NOTE — ED Notes (Signed)
Patient is being discharged from the Urgent Care and sent to the Emergency Department via POV . Per K .[place], [place], patient is in need of higher level of care due to limited resources, severe abdominal pain post colooscopy. Patient is aware and verbalizes understanding of plan of care.  Vitals:   11/03/21 1907  BP: (!) 148/88  Pulse: (!) 104  Resp: 16  Temp: 98 F (36.7 C)  SpO2: 98%

## 2021-11-03 NOTE — ED Triage Notes (Signed)
Reports having bowel movement today.  Has abdominal pain, difficulty passing urine.  Patient reports having colonoscopy last week and has been vomiting since the day after procedure

## 2021-11-03 NOTE — ED Notes (Signed)
Patient is eating and drinking with no nauseas or vomiting

## 2021-11-03 NOTE — ED Notes (Addendum)
Informed pt that I am still waiting for urine he responded "I have prostate cancer and the urine will not come out". Provider made aware

## 2021-11-03 NOTE — ED Triage Notes (Signed)
Patient seen today at urgent care and they said he needed an "IV to make him feel better." Stated his hernia, back and stomach are hurting. Hernia is above his belly button, which is where his pain is.

## 2021-11-03 NOTE — ED Provider Notes (Signed)
[facility]    CSN: [id number] Arrival date & time: 11/03/21  1902      History   Chief Complaint Chief Complaint  Patient presents with   Abdominal Pain    HPI [name] is a 66 y.o. male.   Complains of abdominal pain since having a colonoscopy on 7/12.  Patient reports he has vomited multiple times.  Patient complains of severe pain.  Patient vomiting on arrival to urgent care.  Patient is tearful and obviously uncomfortable  The history is provided by the patient. No language interpreter was used.  Abdominal Pain Pain location:  Generalized Pain quality: stabbing   Pain radiates to:  Does not radiate Pain severity:  Severe Onset quality:  Gradual Duration:  5 days Timing:  Constant Progression:  Worsening Chronicity:  New Relieved by:  Nothing Worsened by:  Nothing Associated symptoms: nausea and vomiting     Past Medical History:  Diagnosis Date   ALLERGIC RHINITIS 01/25/2007   ANXIETY DISORDER, GENERALIZED 01/25/2007   Arthritis    Chronic kidney disease    stage 3   COPD (chronic obstructive pulmonary disease) ([place])    DEPRESSION 01/25/2007   ESOPHAGITIS 12/28/2007   Fatty liver 10/09/2010   GERD 01/25/2007   GLUCOSE INTOLERANCE, HX OF 01/25/2007   Heart murmur    hx of    HEMORRHOIDS, INTERNAL, WITH BLEEDING 04/26/2008   Hiatal hernia    HYPERLIPIDEMIA 12/27/2007   HYPERTENSION, ESSENTIAL NOS 01/25/2007   Hyperthyroidism 08/11/2010   HYPERTROPHY PROSTATE W/UR OBST & OTH LUTS 02/27/2010   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   LOW BACK PAIN, CHRONIC 11/08/2007   Pancreatitis    Prostate cancer ([place])    Rectal abscess    SCHIZOPHRENIA NEC, CHRONIC 01/25/2007   SCOLIOSIS NEC 01/25/2007   Sebaceous cyst 08/10/2010   SMOKER 08/05/2009   UPPER GASTROINTESTINAL HEMORRHAGE 01/25/2007    Patient Active Problem List   Diagnosis Date Noted   Paresthesia 08/26/2021   Ulnar neuropathy at elbow, left 08/26/2021   Gait abnormality  07/14/2021   Chronic bilateral low back pain with bilateral sciatica 07/14/2021   Neck pain 07/14/2021   Left hand weakness 07/14/2021   Trigger finger, right middle finger 06/02/2021   Coronary artery calcification 08/15/2020   Nodule of apex of right lung 05/29/2020   Malignant neoplasm of prostate ([place]) 10/26/2019   Body mass index (BMI) 25.0-25.9, adult 10/25/2019   Spondylosis of cervical region without myelopathy or radiculopathy 09/26/2019   Numbness of hand 06/22/2019   Ulnar neuropathy at elbow of left upper extremity 06/05/2019   Chronic obstructive pulmonary disease ([place]) 10/24/2018   Cervical radiculopathy at C8 01/18/2018   Right lateral epicondylitis 12/21/2017   Pain in joint, shoulder region 73/53/2992   Periumbilical abdominal pain 02/13/2015   Elevated PSA 08/04/2014   Subacromial bursitis 04/11/2014   Neck pain on right side 03/05/2014   Bilateral shoulder pain 03/05/2014   Recurrent boils 03/05/2014   Foreign body in stomach 12/31/2013   Reflux esophagitis 12/31/2013   Weight loss 05/06/2012   Incisional hernia 11/23/2011   Cramp of limb 01/13/2011   Hyperthyroidism 08/11/2010   Sebaceous cyst 08/10/2010   HYPERTROPHY PROSTATE W/UR OBST & OTH LUTS 02/27/2010   Tobacco use disorder 08/05/2009   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 Zella Richer   COLONOSCOPY  04/23/2010   normal   COLONOSCOPY WITH PROPOFOL  01/22/2021   poor prep[    ESOPHAGOGASTRODUODENOSCOPY N/A 12/31/2013   Procedure: ESOPHAGOGASTRODUODENOSCOPY (EGD);  Surgeon: Gatha Mayer, MD;  Location: [facility];  Service: Endoscopy;  Laterality: N/A;   Excision of scalp lesion  07/18/2009   Excision of thyroid mass     GANGLION CYST EXCISION     right foot x 2   HERNIA REPAIR     ventral   INCISIONAL HERNIA REPAIR  12/01/2011   Procedure: LAPAROSCOPIC INCISIONAL HERNIA;  Surgeon: Harl Bowie, MD;  Location: [facility];  Service: General;  Laterality: N/A;   SHOULDER SURGERY     right   UPPER GASTROINTESTINAL ENDOSCOPY         Home Medications    Prior to Admission medications   Medication Sig Start Date End Date Taking? Authorizing Provider  acetaminophen (TYLENOL) 500 MG tablet Take 1,000 mg by mouth every 6 (six) hours as needed for moderate pain.     [provider]  albuterol (PROVENTIL) (2.5 MG/3ML) 0.083% nebulizer solution INHALE 3 ML BY NEBULIZATION EVERY 6 HOURS AS NEEDED FOR WHEEZING OR SHORTNESS OF BREATH 03/09/21   Marrian Salvage, FNP  albuterol (VENTOLIN HFA) 108 (90 Base) MCG/ACT inhaler TAKE 2 PUFFS BY MOUTH EVERY 6 HOURS AS NEEDED FOR WHEEZE OR SHORTNESS OF BREATH 07/22/21   Margaretha Seeds, MD  amLODipine (NORVASC) 10 MG tablet Take 1 tablet (10 mg total) by mouth daily. 08/22/20   Marrian Salvage, FNP  aspirin 325 MG tablet Take 325 mg by mouth daily.    [provider]  benztropine (COGENTIN) 0.5 MG tablet Take 1 tablet (0.5 mg total) by mouth 2 (two) times daily. As needed for side effects of Prolixin 09/15/21   Ursula Alert, MD  Blood Pressure Monitoring (BLOOD PRESSURE CUFF) MISC Use daily as directed to check blood pressure 06/01/18   Marrian Salvage, FNP  celecoxib (CELEBREX) 50 MG capsule Take 1 capsule (50 mg total) by mouth 2 (two) times daily as needed for pain. 08/26/21   Marcial Pacas, MD  Cyanocobalamin (VITAMIN B-12 PO) Take by mouth.    [provider]  cyclobenzaprine  (FLEXERIL) 10 MG tablet TAKE 1 TABLET BY MOUTH EVERYDAY AT BEDTIME 07/27/21   Marrian Salvage, FNP  diclofenac Sodium (VOLTAREN) 1 % GEL Apply 2 g topically 4 (four) times daily as needed (pain). 08/22/20   Marrian Salvage, FNP  esomeprazole (NEXIUM) 40 MG capsule TAKE 1 CAPSULE BY MOUTH EVERY DAY 09/08/21   Marrian Salvage, FNP  fluPHENAZine (PROLIXIN) 10 MG tablet Take 1 tablet (10 mg total) by mouth daily. 06/08/21   Ursula Alert, MD  fluticasone (FLONASE) 50 MCG/ACT nasal spray SPRAY 2 SPRAYS INTO EACH NOSTRIL EVERY DAY 03/17/21   Marrian Salvage, FNP  gabapentin (NEURONTIN) 300 MG capsule Take 1 capsule (300 mg total) by mouth 2 (two) times daily. 08/22/20   Marrian Salvage, FNP  hydrOXYzine (ATARAX) 25 MG tablet Take 0.5-1 tablets (12.5-25 mg total) by mouth daily as needed for anxiety. Please limit use 09/15/21   Ursula Alert, MD  KLOR-CON M20 20  MEQ tablet TAKE 1 TABLET BY MOUTH EVERY DAY 09/24/21   Marrian Salvage, FNP  naproxen sodium (ALEVE) 220 MG tablet Take 440 mg by mouth daily as needed (pain).     [provider]  sildenafil (VIAGRA) 100 MG tablet Take 100 mg by mouth daily as needed. 10/28/20   [provider]  SYMBICORT 160-4.5 MCG/ACT inhaler INHALE 2 PUFFS INTO THE LUNGS IN THE MORNING AND AT BEDTIME. TAKE 2 PUFFS BY MOUTH TWICE A DAY 09/24/21   Marrian Salvage, FNP  tamsulosin (FLOMAX) 0.4 MG CAPS capsule Take 0.4 mg by mouth daily. 09/29/18   [provider]  valsartan (DIOVAN) 160 MG tablet Take 1 tablet (160 mg total) by mouth daily. 08/22/20   Marrian Salvage, FNP  VITAMIN D PO Take by mouth.    [provider]    Family History Family History  Problem Relation Age of Onset   Hypertension Mother    Bone cancer Mother    Hypertension Father    Kidney disease Father    Prostate cancer Brother    Bone cancer Brother    Kidney disease Brother    Mental illness Maternal Grandmother     Mental illness Son    Prostate cancer Other    Colon cancer Neg Hx    Colon polyps Neg Hx    Esophageal cancer Neg Hx    Rectal cancer Neg Hx    Stomach cancer Neg Hx     Social History Social History   Tobacco Use   Smoking status: Every Day    Packs/day: 1.50    Years: 49.00    Total pack years: 73.50    Types: Cigarettes    Start date: 1971   Smokeless tobacco: Never   Tobacco comments:    Started smoking at age 4,still smoking pack - 1-1/2 packs  per day 01/08/2021  Vaping Use   Vaping Use: Never used  Substance Use Topics   Alcohol use: No   Drug use: Yes    Frequency: 1.0 times per week    Types: Marijuana    Comment: last used 10/23/21     Allergies   Celecoxib, Iohexol, Ivp dye [iodinated contrast media], Metoclopramide hcl, Ritalin [methylphenidate], and Wellbutrin [bupropion]   Review of Systems Review of Systems  Gastrointestinal:  Positive for abdominal pain, nausea and vomiting.  All other systems reviewed and are negative.    Physical Exam Triage Vital Signs ED Triage Vitals  Enc Vitals Group     BP 11/03/21 1907 (!) 148/88     Pulse Rate 11/03/21 1907 (!) 104     Resp 11/03/21 1907 16     Temp 11/03/21 1907 98 F (36.7 C)     Temp Source 11/03/21 1907 Oral     SpO2 11/03/21 1907 98 %     Weight --      Height --      Head Circumference --      Peak Flow --      Pain Score 11/03/21 1917 10     Pain Loc --      Pain Edu? --      Excl. in [place]? --    No data found.  Updated Vital Signs BP (!) 148/88 (BP Location: Right Arm)   Pulse (!) 104   Temp 98 F (36.7 C) (Oral)   Resp 16   SpO2 98%   Visual Acuity Right Eye Distance:   Left Eye Distance:  Bilateral Distance:    Right Eye Near:   Left Eye Near:    Bilateral Near:     Physical Exam Vitals and nursing note reviewed.  Constitutional:      General: He is not in acute distress.    Appearance: He is well-developed.  HENT:     Head: Normocephalic and atraumatic.  Eyes:      Conjunctiva/sclera: Conjunctivae normal.  Cardiovascular:     Rate and Rhythm: Tachycardia present.     Heart sounds: No murmur heard. Pulmonary:     Effort: Pulmonary effort is normal. No respiratory distress.     Breath sounds: Normal breath sounds.  Abdominal:     General: Abdomen is protuberant. There is distension.     Palpations: Abdomen is soft.     Tenderness: There is generalized abdominal tenderness.     Hernia: A hernia is present.  Musculoskeletal:        General: No swelling.     Cervical back: Neck supple.  Skin:    General: Skin is warm and dry.     Capillary Refill: Capillary refill takes less than 2 seconds.  Neurological:     General: No focal deficit present.     Mental Status: He is alert.  Psychiatric:        Mood and Affect: Mood normal.      UC Treatments / Results  Labs (all labs ordered are listed, but only abnormal results are displayed) Labs Reviewed - No data to display  EKG   Radiology No results found.  Procedures Procedures (including critical care time)  Medications Ordered in UC Medications  ondansetron (ZOFRAN-ODT) disintegrating tablet 4 mg (4 mg Oral Given 11/03/21 1922)    Initial Impression / Assessment and Plan / UC Course  I have reviewed the triage vital signs and the nursing notes.  Pertinent labs & imaging results that were available during my care of the patient were reviewed by me and considered in my medical decision making (see chart for details).     MDM: Patient is given Zofran for vomiting.  Discussed with patient and wife the limitations of urgent care.  I have advised patient that he needs to proceed to the emergency department for further evaluation.  I offered Ms. transport patient's wife is comfortable taking him to the hospital Final Clinical Impressions(s) / UC Diagnoses   Final diagnoses:  Continuous severe abdominal pain   Discharge Instructions   None    ED Prescriptions   None    PDMP  not reviewed this encounter.   Fransico Meadow, [place] 11/03/21 1929

## 2021-11-03 NOTE — ED Provider Triage Note (Signed)
Emergency Medicine Provider Triage Evaluation Note  [name] , a 66 y.o. male  was evaluated in triage.  Pt complains of generalized abdominal pain, persistent nausea, vomiting after having colonoscopy 5 days ago.  He denies any blood in his stool.  Bowel movement 2 days ago which is small.  He is unsure if he is passing flatus.  No fever, chest pain, shortness of breath  Review of Systems  Positive: Abdominal pain, nausea and vomiting Negative:   Physical Exam  BP (!) 157/101   Pulse (!) 104   Temp 98.9 F (37.2 C) (Core)   Resp 19   SpO2 100%  Gen:   Awake, no distress   Resp:  Normal effort  MSK:   Moves extremities without difficulty  ABD:  Large midline abdominal incision with ventral hernia soft, reducible Other:    Medical Decision Making  Medically screening exam initiated at 7:56 PM.  Appropriate orders placed.  [name] was informed that the remainder of the evaluation will be completed by another provider, this initial triage assessment does not replace that evaluation, and the importance of remaining in the ED until their evaluation is complete.  Abd pain, N/V   [name] A, PA-C 11/03/21 1957

## 2021-11-03 NOTE — ED Notes (Signed)
Notified provider of patient

## 2021-11-04 LAB — URINALYSIS, ROUTINE W REFLEX MICROSCOPIC
Bilirubin Urine: NEGATIVE
Glucose, UA: NEGATIVE mg/dL
Hgb urine dipstick: NEGATIVE
Ketones, ur: NEGATIVE mg/dL
Leukocytes,Ua: NEGATIVE
Nitrite: NEGATIVE
Protein, ur: NEGATIVE mg/dL
Specific Gravity, Urine: 1.013 (ref 1.005–1.030)
pH: 6 (ref 5.0–8.0)

## 2021-11-04 MED ORDER — DICYCLOMINE HCL 20 MG PO TABS: 20.0000 mg | ORAL_TABLET | Freq: Two times a day (BID) | ORAL | 0 refills | Status: DC | PRN

## 2021-11-04 MED ORDER — ONDANSETRON HCL 4 MG PO TABS: 4.0000 mg | ORAL_TABLET | Freq: Four times a day (QID) | ORAL | 0 refills | Status: DC

## 2021-11-04 NOTE — Discharge Instructions (Addendum)
Nausea vomiting-given you Zofran please use as needed for nausea, I recommend a bland diet, please continue with your acid pills, have also given you Bentyl for stomach spasms.  I recommend they follow-up with your GI doctor Difficulty with urination-please follow-up with [facility] for further evaluation. Abdominal aortic aneurysm-on your scan it shows you have a abdominal aortic aneurysm as 2.8 cm in size, recommends that you have a repeat scan of this in the next 5 years for reevaluation please follow your PCP for further assessment.  Come back to the emergency department if you develop chest pain, shortness of breath, severe abdominal pain, uncontrolled nausea, vomiting, diarrhea.

## 2021-11-06 ENCOUNTER — Telehealth: Payer: Medicare Other | Admitting: Thoracic Surgery (Cardiothoracic Vascular Surgery)

## 2021-11-06 ENCOUNTER — Ambulatory Visit
Admission: RE | Admit: 2021-11-06 | Discharge: 2021-11-06 | Disposition: A | Discharge status: Home / Self Care | Payer: Medicare Other | Source: Ambulatory Visit | Attending: Thoracic Surgery (Cardiothoracic Vascular Surgery) | Admitting: Thoracic Surgery (Cardiothoracic Vascular Surgery)

## 2021-11-06 ENCOUNTER — Encounter: Payer: Self-pay | Admitting: Gastroenterology

## 2021-11-06 DIAGNOSIS — I700 Atherosclerosis of aorta: Secondary | ICD-10-CM | POA: Diagnosis not present

## 2021-11-06 DIAGNOSIS — R911 Solitary pulmonary nodule: Secondary | ICD-10-CM | POA: Diagnosis not present

## 2021-11-09 ENCOUNTER — Ambulatory Visit: Payer: Medicare Other | Admitting: Adult Health

## 2021-11-13 ENCOUNTER — Ambulatory Visit (INDEPENDENT_AMBULATORY_CARE_PROVIDER_SITE_OTHER): Payer: Medicare Other | Admitting: Thoracic Surgery (Cardiothoracic Vascular Surgery)

## 2021-11-13 DIAGNOSIS — R911 Solitary pulmonary nodule: Secondary | ICD-10-CM | POA: Diagnosis not present

## 2021-11-13 NOTE — Progress Notes (Signed)
     [address]       [place] [address]             [phone]       Patient: Home Provider: Office Consent for Telemedicine visit obtained.  Today's visit was completed via a real-time telehealth (see specific modality noted below). The patient/authorized person provided oral consent at the time of the visit to engage in a telemedicine encounter with the present provider at [facility]. The patient/authorized person was informed of the potential benefits, limitations, and risks of telemedicine. The patient/authorized person expressed understanding that the laws that protect confidentiality also apply to telemedicine. The patient/authorized person acknowledged understanding that telemedicine does not provide emergency services and that he or she would need to call 911 or proceed to the nearest hospital for help if such a need arose.   Total time spent in the clinical discussion 10 minutes.  Telehealth Modality: Phone visit (audio only)  I had a telephone visit with [name].  He was being followed for pulmonary nodules that was found incidentally.  The most recent CT scan shows resolution of all of these nodules.  He continues to smoke and thus he has received a referral for lung cancer screening.

## 2021-11-15 ENCOUNTER — Other Ambulatory Visit: Payer: Self-pay | Admitting: Psychiatry

## 2021-11-15 ENCOUNTER — Other Ambulatory Visit: Payer: Self-pay | Admitting: Family

## 2021-11-15 DIAGNOSIS — F418 Other specified anxiety disorders: Secondary | ICD-10-CM

## 2021-11-19 ENCOUNTER — Ambulatory Visit (INDEPENDENT_AMBULATORY_CARE_PROVIDER_SITE_OTHER): Payer: Medicare Other | Admitting: Family

## 2021-11-19 ENCOUNTER — Ambulatory Visit (HOSPITAL_BASED_OUTPATIENT_CLINIC_OR_DEPARTMENT_OTHER)
Admission: RE | Admit: 2021-11-19 | Discharge: 2021-11-19 | Disposition: A | Discharge status: Home / Self Care | Payer: Medicare Other | Source: Ambulatory Visit | Attending: Family | Admitting: Family

## 2021-11-19 VITALS — BP 110/70 | HR 95 | Temp 97.9°F | Resp 18 | Ht 68.0 in | Wt 237.0 lb

## 2021-11-19 DIAGNOSIS — E782 Mixed hyperlipidemia: Secondary | ICD-10-CM | POA: Diagnosis not present

## 2021-11-19 DIAGNOSIS — R109 Unspecified abdominal pain: Secondary | ICD-10-CM | POA: Diagnosis not present

## 2021-11-19 NOTE — Progress Notes (Signed)
[name] is a 66 y.o. male with the following history as recorded in Epic[employer]:  Patient Active Problem List   Diagnosis Date Noted   Paresthesia 08/26/2021   Ulnar neuropathy at elbow, left 08/26/2021   Gait abnormality 07/14/2021   Chronic bilateral low back pain with bilateral sciatica 07/14/2021   Neck pain 07/14/2021   Left hand weakness 07/14/2021   Trigger finger, right middle finger 06/02/2021   Coronary artery calcification 08/15/2020   Nodule of apex of right lung 05/29/2020   Malignant neoplasm of prostate ([place]) 10/26/2019   Body mass index (BMI) 25.0-25.9, adult 10/25/2019   Spondylosis of cervical region without myelopathy or radiculopathy 09/26/2019   Numbness of hand 06/22/2019   Ulnar neuropathy at elbow of left upper extremity 06/05/2019   Chronic obstructive pulmonary disease ([place]) 10/24/2018   Cervical radiculopathy at C8 01/18/2018   Right lateral epicondylitis 12/21/2017   Pain in joint, shoulder region 39/06/90   Periumbilical abdominal pain 02/13/2015   Elevated PSA 08/04/2014   Subacromial bursitis 04/11/2014   Neck pain on right side 03/05/2014   Bilateral shoulder pain 03/05/2014   Recurrent boils 03/05/2014   Foreign body in stomach 12/31/2013   Reflux esophagitis 12/31/2013   Weight loss 05/06/2012   Incisional hernia 11/23/2011   Cramp of limb 01/13/2011   Hyperthyroidism 08/11/2010   Sebaceous cyst 08/10/2010   HYPERTROPHY PROSTATE W/UR OBST & OTH LUTS 02/27/2010   Tobacco use disorder 08/05/2009   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      albuterol (PROVENTIL) (2.5 MG/3ML) 0.083% nebulizer solution INHALE 3 ML BY NEBULIZATION EVERY 6 HOURS AS NEEDED FOR WHEEZING OR SHORTNESS OF BREATH (Patient taking differently: Take 3 mLs by nebulization every 6 (six) hours as needed for wheezing or shortness of breath.) 150 mL 1   albuterol (VENTOLIN HFA) 108 (90 Base) MCG/ACT inhaler TAKE 2 PUFFS BY MOUTH EVERY 6 HOURS AS NEEDED FOR WHEEZE OR SHORTNESS OF BREATH (Patient taking differently: Inhale 2 puffs into the lungs every 6 (six) hours as needed for wheezing or shortness of breath.) 8.5 each 6   amLODipine (NORVASC) 10 MG tablet Take 1 tablet (10 mg total) by mouth daily. 90 tablet 3   aspirin 325 MG tablet Take 325 mg by mouth daily.     benztropine (COGENTIN) 0.5 MG tablet Take 1 tablet (0.5 mg total) by mouth 2 (two) times daily. As needed for side effects of Prolixin 180 tablet 0   Blood Pressure Monitoring (BLOOD PRESSURE CUFF) MISC Use daily as directed to check blood pressure 1 each 0   celecoxib (CELEBREX) 50 MG capsule Take 1 capsule (50 mg total) by mouth 2 (two) times daily as needed for pain. 60 capsule 6   Cyanocobalamin (VITAMIN B-12 PO) Take 1 tablet by mouth daily.     cyclobenzaprine (FLEXERIL) 10 MG tablet TAKE 1 TABLET BY MOUTH EVERYDAY AT BEDTIME 30 tablet 3   diclofenac Sodium (VOLTAREN) 1 % GEL Apply  2 g topically 4 (four) times daily as needed (pain). 150 g 1   dicyclomine (BENTYL) 20 MG tablet Take 1 tablet (20 mg total) by mouth 2 (two) times daily as needed for spasms. 20 tablet 0   esomeprazole (NEXIUM) 40 MG capsule TAKE 1 CAPSULE BY MOUTH EVERY DAY (Patient taking differently: Take 40 mg by mouth daily.) 90 capsule 3   fluPHENAZine (PROLIXIN) 10 MG tablet Take 1 tablet (10 mg total) by mouth daily. 90 tablet 1   fluticasone (FLONASE) 50 MCG/ACT  nasal spray SPRAY 2 SPRAYS INTO EACH NOSTRIL EVERY DAY (Patient taking differently: Place 2 sprays into both nostrils daily.) 48 mL 2   gabapentin (NEURONTIN) 300 MG capsule Take 1 capsule (300 mg total) by mouth 2 (two) times daily. 180 capsule 3   hydrOXYzine (ATARAX) 25 MG tablet Take 0.5-1 tablets (12.5-25 mg total) by mouth daily as needed for anxiety. Please limit use 90 tablet 0   KLOR-CON M20 20 MEQ tablet TAKE 1 TABLET BY MOUTH EVERY DAY (Patient taking differently: Take 20 mEq by mouth daily.) 90 tablet 3   ondansetron (ZOFRAN) 4 MG tablet Take 1 tablet (4 mg total) by mouth every 6 (six) hours. 12 tablet 0   sildenafil (VIAGRA) 100 MG tablet Take 100 mg by mouth daily as needed for erectile dysfunction.     SYMBICORT 160-4.5 MCG/ACT inhaler INHALE 2 PUFFS INTO THE LUNGS IN THE MORNING AND AT BEDTIME. TAKE 2 PUFFS BY MOUTH TWICE A DAY (Patient taking differently: Inhale 2 puffs into the lungs in the morning and at bedtime.) 30.6 each 1   tamsulosin (FLOMAX) 0.4 MG CAPS capsule Take 0.4 mg by mouth daily.     valsartan (DIOVAN) 160 MG tablet Take 1 tablet (160 mg total) by mouth daily. 90 tablet 3   VITAMIN D PO Take 1 tablet by mouth daily.     No current facility-administered medications for this visit.    Allergies: Celecoxib, Iohexol, Ivp dye [iodinated contrast media], Metoclopramide hcl, Ritalin [methylphenidate], and Wellbutrin [bupropion]  Past Medical History:  Diagnosis Date   ALLERGIC RHINITIS 01/25/2007   ANXIETY DISORDER, GENERALIZED 01/25/2007   Arthritis    Chronic kidney disease    stage 3   COPD (chronic obstructive pulmonary disease) ([place])    DEPRESSION 01/25/2007   ESOPHAGITIS 12/28/2007   Fatty liver 10/09/2010   GERD 01/25/2007   GLUCOSE INTOLERANCE, HX OF 01/25/2007   Heart murmur    hx of    HEMORRHOIDS, INTERNAL, WITH BLEEDING 04/26/2008   Hiatal hernia    HYPERLIPIDEMIA 12/27/2007   HYPERTENSION, ESSENTIAL NOS 01/25/2007   Hyperthyroidism 08/11/2010    HYPERTROPHY PROSTATE W/UR OBST & OTH LUTS 02/27/2010   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 Zella Richer   COLONOSCOPY  04/23/2010   normal   COLONOSCOPY WITH PROPOFOL  01/22/2021   poor prep[   ESOPHAGOGASTRODUODENOSCOPY N/A 12/31/2013   Procedure: ESOPHAGOGASTRODUODENOSCOPY (EGD);  Surgeon: Gatha Mayer, MD;  Location: [facility];  Service: Endoscopy;  Laterality: N/A;   Excision of scalp lesion  07/18/2009   Excision of thyroid  mass     GANGLION CYST EXCISION     right foot x 2   HERNIA REPAIR     ventral   INCISIONAL HERNIA REPAIR  12/01/2011   Procedure: LAPAROSCOPIC INCISIONAL HERNIA;  Surgeon: Shelly Rubenstein, MD;  Location: [facility];  Service: General;  Laterality: N/A;   SHOULDER SURGERY     right   UPPER GASTROINTESTINAL ENDOSCOPY      Family History  Problem Relation Age of Onset   Hypertension Mother    Bone cancer Mother    Hypertension Father    Kidney disease Father    Prostate cancer Brother    Bone cancer Brother    Kidney disease Brother    Mental illness Maternal Grandmother    Mental illness Son    Prostate cancer Other    Colon cancer Neg Hx    Colon polyps Neg Hx    Esophageal cancer Neg Hx    Rectal cancer Neg Hx    Stomach cancer Neg Hx     Social History   Tobacco Use   Smoking status: Every Day    Packs/day: 1.50    Years: 49.00    Total pack years: 73.50    Types: Cigarettes    Start date: 46   Smokeless tobacco: Never   Tobacco comments:    Started smoking at age 47,still smoking pack - 1-1/2 packs  per day 01/08/2021  Substance Use  Topics   Alcohol use: No    Subjective:   Colonoscopy was done October 28, 2021; notes that he had problems with vomiting for 1-2 days after colonoscopy;vomiting has now resolved and continued with persisting abdominal pain/ constipation;  Seen at ER with symptoms- had normal CT; no blockage noted; Has not let GI know regarding persisting symptoms;  Known history of ventral wall hernia but is not a surgical candidate due to continued smoking;     Objective:  Vitals:   11/19/21 1433  BP: 110/70  Pulse: 95  Resp: 18  Temp: 97.9 F (36.6 C)  TempSrc: Temporal  SpO2: 95%  Weight: 237 lb (107.5 kg)  Height: 5\' 8"  (1.727 m)    General: Well developed, well nourished, in no acute distress  Skin : Warm and dry.  Head: Normocephalic and atraumatic  Lungs: Respirations unlabored; clear to auscultation bilaterally without wheeze, rales, rhonchi  CVS exam: normal rate and regular rhythm.  Abdomen: Soft; nontender; nondistended; normoactive bowel sounds; no masses or hepatosplenomegaly  Neurologic: Alert and oriented; speech intact; face symmetrical; moves all extremities well; CNII-XII intact without focal deficit   Assessment:  1. Abdominal pain, unspecified abdominal location   2. Mixed hyperlipidemia     Plan:  Suspect constipation; to consider trial of Linzess if imaging is normal; will update labs today- CBC was elevated at ER visit 2 weeks ago; update X-ray today to rule out another obstruction; encouraged to follow up with his GI is X-ray is normal;  Patient is unsure why he has not been taking his Rosuvastatin; same conversation in 2022 but patient did not get labs done in 2022 as requested; will re-check lipid panel today and determine dosage of Crestor that is needed.   No follow-ups on file.  Orders Placed This Encounter  Procedures   DG Abd 2 Views    Standing Status:   Future    Number of Occurrences:   1    Standing Expiration Date:   11/20/2022    Order Specific  Question:  Reason for Exam (SYMPTOM  OR DIAGNOSIS REQUIRED)    Answer:   constipation/ abdominal pain    Order Specific Question:   Preferred imaging location?    Answer:   [facility]   CBC with Differential/Platelet   Comp Met (CMET)   Lipid panel    Requested Prescriptions    No prescriptions requested or ordered in this encounter

## 2021-11-20 ENCOUNTER — Other Ambulatory Visit: Payer: Self-pay | Admitting: Family

## 2021-11-20 ENCOUNTER — Telehealth: Payer: Self-pay | Admitting: Family

## 2021-11-20 ENCOUNTER — Other Ambulatory Visit: Payer: Self-pay

## 2021-11-20 LAB — CBC WITH DIFFERENTIAL/PLATELET
Basophils Absolute: 0.1 10*3/uL (ref 0.0–0.1)
Basophils Relative: 0.9 % (ref 0.0–3.0)
Eosinophils Absolute: 0.1 10*3/uL (ref 0.0–0.7)
Eosinophils Relative: 1.4 % (ref 0.0–5.0)
HCT: 42.9 % (ref 39.0–52.0)
Hemoglobin: 14.3 g/dL (ref 13.0–17.0)
Lymphocytes Relative: 17 % (ref 12.0–46.0)
Lymphs Abs: 1.7 10*3/uL (ref 0.7–4.0)
MCHC: 33.4 g/dL (ref 30.0–36.0)
MCV: 87.1 fl (ref 78.0–100.0)
Monocytes Absolute: 0.7 10*3/uL (ref 0.1–1.0)
Monocytes Relative: 6.9 % (ref 3.0–12.0)
Neutro Abs: 7.2 10*3/uL (ref 1.4–7.7)
Neutrophils Relative %: 73.8 % (ref 43.0–77.0)
Platelets: 300 10*3/uL (ref 150.0–400.0)
RBC: 4.92 Mil/uL (ref 4.22–5.81)
RDW: 14.8 % (ref 11.5–15.5)
WBC: 9.8 10*3/uL (ref 4.0–10.5)

## 2021-11-20 LAB — COMPREHENSIVE METABOLIC PANEL
ALT: 9 U/L (ref 0–53)
AST: 8 U/L (ref 0–37)
Albumin: 4 g/dL (ref 3.5–5.2)
Alkaline Phosphatase: 84 U/L (ref 39–117)
BUN: 6 mg/dL (ref 6–23)
CO2: 31 mEq/L (ref 19–32)
Calcium: 9.3 mg/dL (ref 8.4–10.5)
Chloride: 101 mEq/L (ref 96–112)
Creatinine, Ser: 1.41 mg/dL (ref 0.40–1.50)
GFR: 52.59 mL/min — ABNORMAL LOW (ref >60.00)
Glucose, Bld: 91 mg/dL (ref 70–99)
Potassium: 3.8 mEq/L (ref 3.5–5.1)
Sodium: 140 mEq/L (ref 135–145)
Total Bilirubin: 0.4 mg/dL (ref 0.2–1.2)
Total Protein: 6.7 g/dL (ref 6.0–8.3)

## 2021-11-20 LAB — LIPID PANEL
Cholesterol: 214 mg/dL — ABNORMAL HIGH (ref 0–200)
HDL: 38.6 mg/dL — ABNORMAL LOW (ref >39.00)
NonHDL: 175.73
Total CHOL/HDL Ratio: 6
Triglycerides: 234 mg/dL — ABNORMAL HIGH (ref 0.0–149.0)
VLDL: 46.8 mg/dL — ABNORMAL HIGH (ref 0.0–40.0)

## 2021-11-20 LAB — LDL CHOLESTEROL, DIRECT: Direct LDL: 140 mg/dL

## 2021-11-20 MED ORDER — GABAPENTIN 300 MG PO CAPS: 300.0000 mg | ORAL_CAPSULE | Freq: Two times a day (BID) | ORAL | 3 refills | Status: DC

## 2021-11-20 MED ORDER — ROSUVASTATIN CALCIUM 10 MG PO TABS
10.0000 mg | ORAL_TABLET | Freq: Every day | ORAL | 3 refills | Status: DC
Start: 2021-11-20 — End: 2022-11-05

## 2021-11-20 MED ORDER — AMLODIPINE BESYLATE 10 MG PO TABS: 10.0000 mg | ORAL_TABLET | Freq: Every day | ORAL | 3 refills | Status: DC

## 2021-11-20 NOTE — Telephone Encounter (Signed)
Patient forgot to request refills on the following prescriptions. His wife called in today to request the refills on his behalf.    Medication: gabapentin (NEURONTIN) 300 MG capsule [[id number]]   amLODipine (NORVASC) 10 MG tablet [[id number]]  Has the patient contacted their pharmacy? No. (If no, request that the patient contact the pharmacy for the refill.) (If yes, when and what did the pharmacy advise?)  Preferred Pharmacy (with phone number or street name):  [id number] [id number] Phone:  [phone] Fax:  [phone]  Agent: Please be advised that RX refills may take up to 3 business days. We ask that you follow-up with your pharmacy.

## 2021-11-30 ENCOUNTER — Other Ambulatory Visit: Payer: Self-pay | Admitting: *Deleted

## 2021-11-30 DIAGNOSIS — Z87891 Personal history of nicotine dependence: Secondary | ICD-10-CM

## 2021-11-30 DIAGNOSIS — F17210 Nicotine dependence, cigarettes, uncomplicated: Secondary | ICD-10-CM

## 2021-11-30 DIAGNOSIS — Z122 Encounter for screening for malignant neoplasm of respiratory organs: Secondary | ICD-10-CM

## 2021-12-08 ENCOUNTER — Encounter: Payer: Self-pay | Admitting: Gastroenterology

## 2021-12-08 ENCOUNTER — Ambulatory Visit (INDEPENDENT_AMBULATORY_CARE_PROVIDER_SITE_OTHER): Payer: Medicare Other | Admitting: Gastroenterology

## 2021-12-08 VITALS — BP 132/80 | HR 78 | Ht 68.0 in | Wt 233.0 lb

## 2021-12-08 DIAGNOSIS — K5909 Other constipation: Secondary | ICD-10-CM | POA: Diagnosis not present

## 2021-12-08 DIAGNOSIS — K439 Ventral hernia without obstruction or gangrene: Secondary | ICD-10-CM | POA: Diagnosis not present

## 2021-12-08 NOTE — Patient Instructions (Signed)
If you are age 65 or older, your body mass index should be between 23-30. Your Body mass index is 35.43 kg/m. If this is out of the aforementioned range listed, please consider follow up with your Primary Care Provider.  If you are age 92 or younger, your body mass index should be between 19-25. Your Body mass index is 35.43 kg/m. If this is out of the aformentioned range listed, please consider follow up with your Primary Care Provider.   ________________________________________________________  The [facility] providers would like to encourage you to use [facility] to communicate with providers for non-urgent requests or questions.  Due to long hold times on the telephone, sending your provider a message by [facility] may be a faster and more efficient way to get a response.  Please allow 48 business hours for a response.  Please remember that this is for non-urgent requests.  _______________________________________________________   Follow up as needed.  It was a pleasure to see you today!  Thank you for trusting me with your gastrointestinal care!

## 2021-12-08 NOTE — Progress Notes (Signed)
[facility] Progress Note  Chief Complaint: Chronic constipation  Subjective  History: [name] follows up after his screening colonoscopy on 10/28/2021.  Redundant colon causing challenging scope passage to the cecum.  Diminutive tubular adenoma and diminutive hyperplastic polyp removed.  Prep generally good with some residual scattered fibrous debris.  5-year recall recommended. (Aborted colonoscopy in October 2022 due to poor prep.  He received a 2-day bowel prep for the more recent exam) ___________________ He was in the ED 11/03/2021 complaining of abdominal pain nausea and vomiting persisting since his colonoscopy.  He had tenderness around ventral hernia and otherwise negative exam.  Labs and imaging unrevealing.  He saw primary care on 11/19/2021 with resolution of nausea and vomiting, persistent constipation and they recommended a trial of Linzess.  [name] was here with his wife [name], and says the nausea and vomiting resolved.  He still tends toward constipation and has considered starting MiraLAX. He still has tenderness over his abdominal wall hernia and understands that surgery declined to fix it because he is still smoking.  ROS: Cardiovascular:  no chest pain Respiratory: no dyspnea Reports mood stable The patient's Past Medical, Family and Social History were reviewed and are on file in the EMR.  Objective:  Med list reviewed  Current Outpatient Medications:    acetaminophen (TYLENOL) 500 MG tablet, Take 1,000 mg by mouth every 6 (six) hours as needed for moderate pain. , Disp: , Rfl:    albuterol (PROVENTIL) (2.5 MG/3ML) 0.083% nebulizer solution, INHALE 3 ML BY NEBULIZATION EVERY 6 HOURS AS NEEDED FOR WHEEZING OR SHORTNESS OF BREATH (Patient taking differently: Take 3 mLs by nebulization every 6 (six) hours as needed for wheezing or shortness of breath.), Disp: 150 mL, Rfl: 1   albuterol (VENTOLIN HFA) 108 (90 Base) MCG/ACT inhaler, TAKE 2 PUFFS BY MOUTH EVERY  6 HOURS AS NEEDED FOR WHEEZE OR SHORTNESS OF BREATH (Patient taking differently: Inhale 2 puffs into the lungs every 6 (six) hours as needed for wheezing or shortness of breath.), Disp: 8.5 each, Rfl: 6   amLODipine (NORVASC) 10 MG tablet, Take 1 tablet (10 mg total) by mouth daily., Disp: 90 tablet, Rfl: 3   aspirin 325 MG tablet, Take 325 mg by mouth daily., Disp: , Rfl:    benztropine (COGENTIN) 0.5 MG tablet, Take 1 tablet (0.5 mg total) by mouth 2 (two) times daily. As needed for side effects of Prolixin, Disp: 180 tablet, Rfl: 0   Blood Pressure Monitoring (BLOOD PRESSURE CUFF) MISC, Use daily as directed to check blood pressure, Disp: 1 each, Rfl: 0   celecoxib (CELEBREX) 50 MG capsule, Take 1 capsule (50 mg total) by mouth 2 (two) times daily as needed for pain., Disp: 60 capsule, Rfl: 6   Cyanocobalamin (VITAMIN B-12 PO), Take 1 tablet by mouth daily., Disp: , Rfl:    cyclobenzaprine (FLEXERIL) 10 MG tablet, TAKE 1 TABLET BY MOUTH EVERYDAY AT BEDTIME, Disp: 30 tablet, Rfl: 3   diclofenac Sodium (VOLTAREN) 1 % GEL, Apply 2 g topically 4 (four) times daily as needed (pain)., Disp: 150 g, Rfl: 1   dicyclomine (BENTYL) 20 MG tablet, Take 1 tablet (20 mg total) by mouth 2 (two) times daily as needed for spasms., Disp: 20 tablet, Rfl: 0   esomeprazole (NEXIUM) 40 MG capsule, TAKE 1 CAPSULE BY MOUTH EVERY DAY (Patient taking differently: Take 40 mg by mouth daily.), Disp: 90 capsule, Rfl: 3   fluPHENAZine (PROLIXIN) 10 MG tablet, Take 1 tablet (10 mg total)  by mouth daily., Disp: 90 tablet, Rfl: 1   fluticasone (FLONASE) 50 MCG/ACT nasal spray, SPRAY 2 SPRAYS INTO EACH NOSTRIL EVERY DAY (Patient taking differently: Place 2 sprays into both nostrils daily.), Disp: 48 mL, Rfl: 2   gabapentin (NEURONTIN) 300 MG capsule, Take 1 capsule (300 mg total) by mouth 2 (two) times daily., Disp: 180 capsule, Rfl: 3   hydrOXYzine (ATARAX) 25 MG tablet, Take 0.5-1 tablets (12.5-25 mg total) by mouth daily as needed  for anxiety. Please limit use, Disp: 90 tablet, Rfl: 0   KLOR-CON M20 20 MEQ tablet, TAKE 1 TABLET BY MOUTH EVERY DAY (Patient taking differently: Take 20 mEq by mouth daily.), Disp: 90 tablet, Rfl: 3   ondansetron (ZOFRAN) 4 MG tablet, Take 1 tablet (4 mg total) by mouth every 6 (six) hours., Disp: 12 tablet, Rfl: 0   rosuvastatin (CRESTOR) 10 MG tablet, Take 1 tablet (10 mg total) by mouth daily., Disp: 90 tablet, Rfl: 3   sildenafil (VIAGRA) 100 MG tablet, Take 100 mg by mouth daily as needed for erectile dysfunction., Disp: , Rfl:    SYMBICORT 160-4.5 MCG/ACT inhaler, INHALE 2 PUFFS INTO THE LUNGS IN THE MORNING AND AT BEDTIME. TAKE 2 PUFFS BY MOUTH TWICE A DAY (Patient taking differently: Inhale 2 puffs into the lungs in the morning and at bedtime.), Disp: 30.6 each, Rfl: 1   tamsulosin (FLOMAX) 0.4 MG CAPS capsule, Take 0.4 mg by mouth daily., Disp: , Rfl:    valsartan (DIOVAN) 160 MG tablet, Take 1 tablet (160 mg total) by mouth daily., Disp: 90 tablet, Rfl: 3   VITAMIN D PO, Take 1 tablet by mouth daily., Disp: , Rfl:    Vital signs in last 24 hrs: Vitals:   12/08/21 1359  BP: 132/80  Pulse: 78  SpO2: 96%   Wt Readings from Last 3 Encounters:  12/08/21 233 lb (105.7 kg)  11/19/21 237 lb (107.5 kg)  10/28/21 242 lb (109.8 kg)    Physical Exam   Cardiac: Regular without murmur,  no peripheral edema Pulm: clear to auscultation bilaterally, normal RR and effort noted Abdomen: soft, + tenderness over mid to upper abdomen hiatal hernia (more pronounced when standing), with active bowel sounds. No guarding or palpable hepatosplenomegaly. Skin; warm and dry, no jaundice or rash  Labs:     Latest Ref Rng & Units 11/19/2021    3:19 PM 11/03/2021    8:11 PM 05/21/2021    4:19 PM  CBC  WBC 4.0 - 10.5 K/uL 9.8  15.5  9.4   Hemoglobin 13.0 - 17.0 g/dL 14.3  16.9  14.3   Hematocrit 39.0 - 52.0 % 42.9  49.0  42.5   Platelets 150.0 - 400.0 K/uL 300.0  358  309.0       Latest Ref Rng &  Units 11/19/2021    3:19 PM 11/03/2021    8:11 PM 05/21/2021    4:19 PM  CMP  Glucose 70 - 99 mg/dL 91  113  96   BUN 6 - 23 mg/dL '6  14  7   '$ Creatinine 0.40 - 1.50 mg/dL 1.41  1.25  1.25   Sodium 135 - 145 mEq/L 140  137  138   Potassium 3.5 - 5.1 mEq/L 3.8  3.1  4.1   Chloride 96 - 112 mEq/L 101  100  102   CO2 19 - 32 mEq/L '31  26  28   '$ Calcium 8.4 - 10.5 mg/dL 9.3  9.7  9.7   Total  Protein 6.0 - 8.3 g/dL 6.7  8.2  7.5   Total Bilirubin 0.2 - 1.2 mg/dL 0.4  0.8  0.4   Alkaline Phos 39 - 117 U/L 84  80  78   AST 0 - 37 U/L '8  16  14   '$ ALT 0 - 53 U/L '9  22  14     '$ ___________________________________________ Radiologic studies: CLINICAL DATA:  Lung nodule   EXAM: CT CHEST WITHOUT CONTRAST   TECHNIQUE: Multidetector CT imaging of the chest was performed following the standard protocol without IV contrast.   RADIATION DOSE REDUCTION: This exam was performed according to the departmental dose-optimization program which includes automated exposure control, adjustment of the mA and/or kV according to patient size and/or use of iterative reconstruction technique.   COMPARISON:  10/17/2020   FINDINGS: Cardiovascular: Coronary, aortic arch, and branch vessel atherosclerotic vascular disease.   Mediastinum/Nodes: No pathologic adenopathy.   Lungs/Pleura: Prior nodule of the right posterior apex has essentially resolved and requires no further follow up. 2-3 mm subpleural nodules in the right upper lobe anteriorly on images 61 and 75 of series 8 are stable and require no follow up.   Bilateral airway thickening noted.   Upper Abdomen: Cholecystectomy. Abdominal aortic atherosclerosis with some atherosclerotic calcification in the celiac trunk and proximal SMA.   Musculoskeletal: Mild thoracic spondylosis.   IMPRESSION: 1. Prior small subpleural bandlike nodule shown on previous exams has essentially resolved. No clinically significant nodularity in this location or  elsewhere. 2. Airway thickening is present, suggesting bronchitis or reactive airways disease. 3. Aortic Atherosclerosis (ICD10-I70.0). Coronary and systemic atherosclerosis.     Electronically Signed   By: Van Clines M.D.   On: 11/06/2021 14:39 _________________   CLINICAL DATA:  Constipation and abdominal pain   EXAM: ABDOMEN - 2 VIEW   COMPARISON:  CT abdomen and pelvis 11/03/2021   FINDINGS: The bowel gas pattern is normal. Moderate stool in the ascending colon. There is no evidence of free air. No radio-opaque calculi or other significant radiographic abnormality is seen. Cholecystectomy clips.   IMPRESSION: Unremarkable abdominal radiographs.     Electronically Signed   By: Placido Sou M.D.   On: 11/19/2021 15:52   ____________________________________________ Other:   _____________________________________________ Assessment & Plan  Assessment: Encounter Diagnoses  Name Primary?   Chronic constipation Yes   Ventral hernia without obstruction or gangrene    Chronic constipation likely multifactorial with redundant colon anatomy, insufficient dietary fiber and activity, medication side effects.  I would prefer not to start additional prescription medicine on him due to his polypharmacy.  Half a capful of MiraLAX a day, increasing dose and frequency if needed, seems the safest route for him.  He will see me as needed Nelida Meuse III

## 2021-12-11 ENCOUNTER — Telehealth: Payer: Self-pay | Admitting: Pulmonary Disease

## 2021-12-11 NOTE — Telephone Encounter (Signed)
Please schedule patient for routine follow-up with me or NP when next available. Last vist >1 year. Notified by his insurance regarding frequent albuterol refills monthly from March to August 2023.

## 2021-12-15 NOTE — Telephone Encounter (Signed)
LMTCB for appt with JE or NP

## 2021-12-16 ENCOUNTER — Ambulatory Visit (INDEPENDENT_AMBULATORY_CARE_PROVIDER_SITE_OTHER): Payer: Medicare Other | Admitting: Psychiatry

## 2021-12-16 ENCOUNTER — Encounter: Payer: Self-pay | Admitting: Psychiatry

## 2021-12-16 VITALS — BP 132/87 | HR 103 | Temp 98.5°F | Wt 235.0 lb

## 2021-12-16 DIAGNOSIS — F203 Undifferentiated schizophrenia: Secondary | ICD-10-CM

## 2021-12-16 DIAGNOSIS — F418 Other specified anxiety disorders: Secondary | ICD-10-CM

## 2021-12-16 DIAGNOSIS — F17200 Nicotine dependence, unspecified, uncomplicated: Secondary | ICD-10-CM

## 2021-12-16 MED ORDER — BENZTROPINE MESYLATE 0.5 MG PO TABS: 0.5000 mg | ORAL_TABLET | Freq: Two times a day (BID) | ORAL | 1 refills | Status: DC

## 2021-12-16 MED ORDER — FLUPHENAZINE HCL 10 MG PO TABS
10.0000 mg | ORAL_TABLET | Freq: Every day | ORAL | 1 refills | Status: DC
Start: 2021-12-16 — End: 2022-05-17

## 2021-12-16 MED ORDER — HYDROXYZINE HCL 25 MG PO TABS: 12.5000 mg | ORAL_TABLET | Freq: Every day | ORAL | 1 refills | Status: DC | PRN

## 2021-12-16 NOTE — Progress Notes (Unsigned)
[name] MD OP Progress Note  12/16/2021 2:56 PM [name]  MRN:  [id number]  Chief Complaint:  Chief Complaint  Patient presents with   Follow-up: 66 year old African-American male, married, has a history of schizophrenia, presented for medication management.   HPI: [name] is a 66 year old African-American male on disability, lives in [place] with his wife, has a history of schizophrenia, essential hypertension, coronary artery disease, prostate cancer, chronic pain, history of multiple surgeries, presented for follow-up appointment.  Patient today reports he is currently struggling with back pain, has appointment scheduled with orthopedic surgeon.  The pain does affect his daily functioning and also his mood.  Patient however reports he has been trying to make use of his coping strategies and hence his mood has been stable.  He reports that as long as he takes his psychotropic medications he feels good.  Patient currently denies any hallucinations.  Patient denies any paranoia.  Did not appear to be preoccupied with any delusions.  Patient reports sleep as good.  Denies any suicidality, homicidality or perceptual disturbances.  Denies side effects to the Prolixin.  Has good support system from his wife.  He enjoys listening to the radio as well as is religious and that keeps him going.  Not interested in smoking cessation.  Visit Diagnosis:    ICD-10-CM   1. Undifferentiated schizophrenia (HCC)  F20.3 hydrOXYzine (ATARAX) 25 MG tablet    fluPHENAZine (PROLIXIN) 10 MG tablet    benztropine (COGENTIN) 0.5 MG tablet    2. Other specified anxiety disorders  F41.8 hydrOXYzine (ATARAX) 25 MG tablet    fluPHENAZine (PROLIXIN) 10 MG tablet   Generalized anxiety not occurring more days than not    3. Tobacco use disorder  F17.200       Past Psychiatric History: Past psychiatric history from progress note on 06/08/2021.  Multiple medication trials including Navane, Haldol,  Prolixin, multiple inpatient behavioral health admissions in the past.  Past Medical History:  Past Medical History:  Diagnosis Date   ALLERGIC RHINITIS 01/25/2007   ANXIETY DISORDER, GENERALIZED 01/25/2007   Arthritis    Chronic kidney disease    stage 3   COPD (chronic obstructive pulmonary disease) ([place])    DEPRESSION 01/25/2007   ESOPHAGITIS 12/28/2007   Fatty liver 10/09/2010   GERD 01/25/2007   GLUCOSE INTOLERANCE, HX OF 01/25/2007   Heart murmur    hx of    HEMORRHOIDS, INTERNAL, WITH BLEEDING 04/26/2008   Hiatal hernia    HYPERLIPIDEMIA 12/27/2007   HYPERTENSION, ESSENTIAL NOS 01/25/2007   Hyperthyroidism 08/11/2010   HYPERTROPHY PROSTATE W/UR OBST & OTH LUTS 02/27/2010   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 Zella Richer   COLONOSCOPY  04/23/2010   normal   COLONOSCOPY WITH PROPOFOL  01/22/2021   poor prep[   ESOPHAGOGASTRODUODENOSCOPY N/A 12/31/2013   Procedure: ESOPHAGOGASTRODUODENOSCOPY (EGD);  Surgeon: Gatha Mayer, MD;  Location: [facility];  Service: Endoscopy;  Laterality: N/A;   Excision of scalp lesion  07/18/2009   Excision of thyroid mass     GANGLION CYST EXCISION     right foot  x 2   HERNIA REPAIR     ventral   INCISIONAL HERNIA REPAIR  12/01/2011   Procedure: LAPAROSCOPIC INCISIONAL HERNIA;  Surgeon: Harl Bowie, MD;  Location: [facility];  Service: General;  Laterality: N/A;   SHOULDER SURGERY     right   UPPER GASTROINTESTINAL ENDOSCOPY      Family Psychiatric History: Reviewed family psychiatric history from progress note on 06/08/2021.  Family  History:  Family History  Problem Relation Age of Onset   Hypertension Mother    Bone cancer Mother    Hypertension Father    Kidney disease Father    Prostate cancer Brother    Bone cancer Brother    Kidney disease Brother    Mental illness Maternal Grandmother    Mental illness Son    Prostate cancer Other    Colon cancer Neg Hx    Colon polyps Neg Hx    Esophageal cancer Neg Hx    Rectal cancer Neg Hx    Stomach cancer Neg Hx     Social History: Reviewed social history from progress note on 06/08/2021. Social History   Socioeconomic History   Marital status: Married    Spouse name: [name]   Number of children: 2   Years of education: Not on file   Highest education level: Not on file  Occupational History   Occupation: Disabled    Employer: DISABLED  Tobacco Use   Smoking status: Every Day    Packs/day: 1.50    Years: 49.00    Total pack years: 73.50    Types: Cigarettes    Start date: 1971   Smokeless tobacco: Never   Tobacco comments:    Started smoking at age 71,still smoking pack - 1-1/2 packs  per day 01/08/2021  Vaping Use   Vaping Use: Never used  Substance and Sexual Activity   Alcohol use: No   Drug use: Yes    Frequency: 1.0 times per week    Types: Marijuana    Comment: last used 10/23/21   Sexual activity: Yes    Partners: Female  Other Topics Concern   Not on file  Social History Narrative   HSG disabled due to schizophrenia - 1983. stable long-term marriage with children. cared for his father-in-law- passed away in 2022-07-08   Social Determinants of Health   Financial Resource Strain: Low Risk  (08/04/2021)   Overall Financial Resource Strain (CARDIA)    Difficulty of Paying Living Expenses: Not hard at all  Food Insecurity: No Food Insecurity (08/04/2021)   Hunger Vital Sign    Worried About Running Out of Food in the Last Year: Never true    Ran Out of Food in the Last Year: Never true  Transportation Needs: No Transportation Needs (08/04/2021)    PRAPARE - Hydrologist (Medical): No    Lack of Transportation (Non-Medical): No  Physical Activity: Inactive (08/04/2021)   Exercise Vital Sign    Days of Exercise per Week: 0 days    Minutes of Exercise per Session: 0 min  Stress: No Stress Concern Present (08/04/2021)   [place]    Feeling of Stress : Not at all  Social Connections: Moderately Integrated (08/04/2021)   Social Connection and Isolation Panel [NHANES]    Frequency of Communication with Friends and Family: More than three times a week    Frequency of Social Gatherings with Friends and Family: Twice a week  Attends Religious Services: More than 4 times per year    Active Member of Clubs or Organizations: No    Attends Archivist Meetings: Never    Marital Status: Married    Allergies:  Allergies  Allergen Reactions   Celecoxib Other (See Comments)     rectal bleeding   Iohexol Other (See Comments)    Affects nerves: triggers schizophrenia   Ivp Dye [Iodinated Contrast Media]    Metoclopramide Hcl Other (See Comments)    Affects nerves: triggers schizophrenia   Ritalin [Methylphenidate]    Wellbutrin [Bupropion] Other (See Comments)    Makes pt smoke heavily    Metabolic Disorder Labs: Lab Results  Component Value Date   HGBA1C 5.7 08/01/2014   No results found for: "PROLACTIN" Lab Results  Component Value Date   CHOL 214 (H) 11/19/2021   TRIG 234.0 (H) 11/19/2021   HDL 38.60 (L) 11/19/2021   CHOLHDL 6 11/19/2021   VLDL 46.8 (H) 11/19/2021   LDLCALC 99 10/24/2018   LDLCALC 110 (H) 10/19/2017   Lab Results  Component Value Date   TSH 1.73 05/21/2021   TSH 1.440 11/16/2020    Therapeutic Level Labs: No results found for: "LITHIUM" No results found for: "VALPROATE" No results found for: "CBMZ"  Current Medications: Current Outpatient Medications  Medication Sig Dispense Refill    acetaminophen (TYLENOL) 500 MG tablet Take 1,000 mg by mouth every 6 (six) hours as needed for moderate pain.      albuterol (PROVENTIL) (2.5 MG/3ML) 0.083% nebulizer solution INHALE 3 ML BY NEBULIZATION EVERY 6 HOURS AS NEEDED FOR WHEEZING OR SHORTNESS OF BREATH (Patient taking differently: Take 3 mLs by nebulization every 6 (six) hours as needed for wheezing or shortness of breath.) 150 mL 1   albuterol (VENTOLIN HFA) 108 (90 Base) MCG/ACT inhaler TAKE 2 PUFFS BY MOUTH EVERY 6 HOURS AS NEEDED FOR WHEEZE OR SHORTNESS OF BREATH (Patient taking differently: Inhale 2 puffs into the lungs every 6 (six) hours as needed for wheezing or shortness of breath.) 8.5 each 6   amLODipine (NORVASC) 10 MG tablet Take 1 tablet (10 mg total) by mouth daily. 90 tablet 3   aspirin 325 MG tablet Take 325 mg by mouth daily.     Blood Pressure Monitoring (BLOOD PRESSURE CUFF) MISC Use daily as directed to check blood pressure 1 each 0   celecoxib (CELEBREX) 50 MG capsule Take 1 capsule (50 mg total) by mouth 2 (two) times daily as needed for pain. 60 capsule 6   Cyanocobalamin (VITAMIN B-12 PO) Take 1 tablet by mouth daily.     cyclobenzaprine (FLEXERIL) 10 MG tablet TAKE 1 TABLET BY MOUTH EVERYDAY AT BEDTIME 30 tablet 3   diclofenac Sodium (VOLTAREN) 1 % GEL Apply 2 g topically 4 (four) times daily as needed (pain). 150 g 1   dicyclomine (BENTYL) 20 MG tablet Take 1 tablet (20 mg total) by mouth 2 (two) times daily as needed for spasms. 20 tablet 0   esomeprazole (NEXIUM) 40 MG capsule TAKE 1 CAPSULE BY MOUTH EVERY DAY (Patient taking differently: Take 40 mg by mouth daily.) 90 capsule 3   fluticasone (FLONASE) 50 MCG/ACT nasal spray SPRAY 2 SPRAYS INTO EACH NOSTRIL EVERY DAY (Patient taking differently: Place 2 sprays into both nostrils daily.) 48 mL 2   gabapentin (NEURONTIN) 300 MG capsule Take 1 capsule (300 mg total) by mouth 2 (two) times daily. 180 capsule 3   KLOR-CON M20 20 MEQ tablet TAKE 1 TABLET BY MOUTH  EVERY  DAY (Patient taking differently: Take 20 mEq by mouth daily.) 90 tablet 3   ondansetron (ZOFRAN) 4 MG tablet Take 1 tablet (4 mg total) by mouth every 6 (six) hours. 12 tablet 0   rosuvastatin (CRESTOR) 10 MG tablet Take 1 tablet (10 mg total) by mouth daily. 90 tablet 3   sildenafil (VIAGRA) 100 MG tablet Take 100 mg by mouth daily as needed for erectile dysfunction.     SYMBICORT 160-4.5 MCG/ACT inhaler INHALE 2 PUFFS INTO THE LUNGS IN THE MORNING AND AT BEDTIME. TAKE 2 PUFFS BY MOUTH TWICE A DAY (Patient taking differently: Inhale 2 puffs into the lungs in the morning and at bedtime.) 30.6 each 1   tamsulosin (FLOMAX) 0.4 MG CAPS capsule Take 0.4 mg by mouth daily.     valsartan (DIOVAN) 160 MG tablet Take 1 tablet (160 mg total) by mouth daily. 90 tablet 3   VITAMIN D PO Take 1 tablet by mouth daily.     benztropine (COGENTIN) 0.5 MG tablet Take 1 tablet (0.5 mg total) by mouth 2 (two) times daily. As needed for side effects of Prolixin 180 tablet 1   fluPHENAZine (PROLIXIN) 10 MG tablet Take 1 tablet (10 mg total) by mouth daily. 90 tablet 1   hydrOXYzine (ATARAX) 25 MG tablet Take 0.5-1 tablets (12.5-25 mg total) by mouth daily as needed for anxiety. Please limit use 90 tablet 1   No current facility-administered medications for this visit.     Musculoskeletal: Strength & Muscle Tone: within normal limits Gait & Station: normal Patient leans: N/A  Psychiatric Specialty Exam: Review of Systems  Musculoskeletal:  Positive for back pain.  Psychiatric/Behavioral: Negative.    All other systems reviewed and are negative.   Blood pressure 132/87, pulse (!) 103, temperature 98.5 F (36.9 C), temperature source Temporal, weight 235 lb (106.6 kg).Body mass index is 35.73 kg/m.  General Appearance: Casual  Eye Contact:  Fair  Speech:  Clear and Coherent  Volume:  Normal  Mood:  Euthymic  Affect:  Congruent  Thought Process:  Goal Directed and Descriptions of Associations: Intact   Orientation:  Full (Time, Place, and Person)  Thought Content: Logical   Suicidal Thoughts:  No  Homicidal Thoughts:  No  Memory:  Immediate;   Good Recent;   Fair Remote;   Fair  Judgement:  Fair  Insight:  Fair  Psychomotor Activity:  Normal  Concentration:  Concentration: Fair and Attention Span: Fair  Recall:  [employer] of Knowledge: Fair  Language: Fair  Akathisia:  No  Handed:  Right  AIMS (if indicated): done  Assets:  Communication Skills Desire for Improvement Housing Social Support  ADL's:  Intact  Cognition: WNL  Sleep:  Fair   Screenings: Administrator, Civil Service Office Visit from 12/16/2021 in [place] Office Visit from 09/15/2021 in [place] Office Visit from 06/08/2021 in [facility] Total Score 0 0 0      [place] Visit from 12/16/2021 in [place] Office Visit from 06/08/2021 in [facility]  Total GAD-7 Score 4 0      PHQ2-9    [place] Visit from 12/16/2021 in [facility] Visit from 09/15/2021 in [facility] from 08/04/2021 in [facility] Visit from 06/08/2021 in [facility] [place] Office Visit  from 11/20/2020 in [facility] [employer]  PHQ-2 Total Score 0 0 0 0 0  PHQ-9 Total Score -- -- -- 0 --      [place] Visit from 12/16/2021 in [facility] Most recent reading at 12/16/2021  2:27 PM ED from 11/03/2021 in [facility] [place] DEPT Most recent reading at 11/03/2021  7:51 PM ED from 11/03/2021 in [facility]  Most recent reading at 11/03/2021  7:19 PM  C-SSRS RISK CATEGORY No Risk No Risk No Risk         Assessment and Plan: [name] is a 66 year old African-American male on disability, history of schizophrenia, hypertension, coronary artery disease, prostate cancer, chronic pain, married, lives in [place] was evaluated in office today.  Patient is currently stable.  Plan Schizophrenia-stable Prolixin 10 mg p.o. daily Benztropine 0.5 mg p.o. twice daily as needed  Other specified anxiety disorder-generalized anxiety not occurring more days than not-stable Hydroxyzine 12.5-25 mg p.o. daily as needed for anxiety attacks.  Tobacco use disorder-unstable Patient is not ready to quit.  Follow-up in clinic in 4 months or sooner if needed.   This note was generated in part or whole with voice recognition software. Voice recognition is usually quite accurate but there are transcription errors that can and very often do occur. I apologize for any typographical errors that were not detected and corrected.      Ursula Alert, MD 12/17/2021, 7:01 PM

## 2021-12-22 ENCOUNTER — Ambulatory Visit: Payer: Medicare Other | Admitting: Physician Assistant

## 2021-12-31 ENCOUNTER — Other Ambulatory Visit: Payer: Self-pay

## 2021-12-31 ENCOUNTER — Emergency Department (HOSPITAL_COMMUNITY)
Admission: EM | Admit: 2021-12-31 | Discharge: 2021-12-31 | Disposition: A | Discharge status: Home / Self Care | Payer: Medicare Other | Attending: Emergency Medicine | Admitting: Emergency Medicine

## 2021-12-31 ENCOUNTER — Encounter (HOSPITAL_COMMUNITY): Payer: Self-pay | Admitting: Emergency Medicine

## 2021-12-31 ENCOUNTER — Emergency Department (HOSPITAL_COMMUNITY): Payer: Medicare Other

## 2021-12-31 DIAGNOSIS — M5441 Lumbago with sciatica, right side: Secondary | ICD-10-CM | POA: Diagnosis not present

## 2021-12-31 DIAGNOSIS — G8929 Other chronic pain: Secondary | ICD-10-CM

## 2021-12-31 DIAGNOSIS — M545 Low back pain, unspecified: Secondary | ICD-10-CM | POA: Diagnosis not present

## 2021-12-31 DIAGNOSIS — M25551 Pain in right hip: Secondary | ICD-10-CM | POA: Diagnosis not present

## 2021-12-31 DIAGNOSIS — Z8546 Personal history of malignant neoplasm of prostate: Secondary | ICD-10-CM | POA: Insufficient documentation

## 2021-12-31 DIAGNOSIS — Z7982 Long term (current) use of aspirin: Secondary | ICD-10-CM | POA: Insufficient documentation

## 2021-12-31 DIAGNOSIS — Z79899 Other long term (current) drug therapy: Secondary | ICD-10-CM | POA: Diagnosis not present

## 2021-12-31 DIAGNOSIS — N189 Chronic kidney disease, unspecified: Secondary | ICD-10-CM | POA: Insufficient documentation

## 2021-12-31 DIAGNOSIS — E039 Hypothyroidism, unspecified: Secondary | ICD-10-CM | POA: Diagnosis not present

## 2021-12-31 MED ORDER — LIDOCAINE 5 % EX PTCH
1.0000 | MEDICATED_PATCH | CUTANEOUS | Status: DC
Start: 2021-12-31 — End: 2021-12-31
  Administered 2021-12-31: 1 via TRANSDERMAL
  Filled 2021-12-31: qty 1

## 2021-12-31 MED ORDER — DEXAMETHASONE SODIUM PHOSPHATE 10 MG/ML IJ SOLN
10.0000 mg | Freq: Once | INTRAMUSCULAR | Status: AC
Start: 2021-12-31 — End: 2021-12-31
  Administered 2021-12-31: 10 mg via INTRAMUSCULAR
  Filled 2021-12-31: qty 1

## 2021-12-31 MED ORDER — ACETAMINOPHEN 325 MG PO TABS
650.0000 mg | ORAL_TABLET | Freq: Once | ORAL | Status: AC
  Administered 2021-12-31: 650 mg via ORAL
  Filled 2021-12-31: qty 2

## 2021-12-31 NOTE — ED Triage Notes (Signed)
Pt reports fall 3 months ago, reports pain along right hip and lower back, radiaiting to right leg. NAD at present.

## 2021-12-31 NOTE — Discharge Instructions (Addendum)
Please return to the ED with any new symptoms such as inability to control your bowel or bladder, inability to bear weight on lower extremities, numbness in your groin Please follow-up with your PCP for further management Please read attached guide concerning chronic back pain as well as sciatica Please continue taking muscle relaxers at home with Tylenol

## 2021-12-31 NOTE — ED Provider Notes (Signed)
[facility] EMERGENCY DEPARTMENT Provider Note   CSN: [id number] Arrival date & time: 12/31/21  1157     History  Chief Complaint  Patient presents with   Back Pain   Hip Pain    [name] is a 65 y.o. male with extensive medical history to include fatty liver, hypothyroidism, prostate cancer, GI bleed, esophagitis, chronic low back pain, CKD.  Patient presents to ED for evaluation of right-sided hip pain and low back pain.  Patient reports that 3 months ago he had a ground-level fall onto his right side.  Patient reports that since this time he has had intermittent pain that comes and goes described as aching, throbbing.  The patient reports he has been taking gabapentin, Tylenol at home without relief.  The patient states that back pain got so bad last night he can barely sleep.  Patient does have a diagnosis of chronic back pain in his chart.  The patient denies any red flag symptoms of low back pain, denies fevers, nausea or vomiting, lightheadedness dizziness or weakness.   Back Pain Associated symptoms: no fever, no numbness and no weakness   Hip Pain       Home Medications Prior to Admission medications   Medication Sig Start Date End Date Taking? Authorizing Provider  acetaminophen (TYLENOL) 500 MG tablet Take 1,000 mg by mouth every 6 (six) hours as needed for moderate pain.     [provider]  albuterol (PROVENTIL) (2.5 MG/3ML) 0.083% nebulizer solution INHALE 3 ML BY NEBULIZATION EVERY 6 HOURS AS NEEDED FOR WHEEZING OR SHORTNESS OF BREATH Patient taking differently: Take 3 mLs by nebulization every 6 (six) hours as needed for wheezing or shortness of breath. 03/09/21   Marrian Salvage, FNP  albuterol (VENTOLIN HFA) 108 (90 Base) MCG/ACT inhaler TAKE 2 PUFFS BY MOUTH EVERY 6 HOURS AS NEEDED FOR WHEEZE OR SHORTNESS OF BREATH Patient taking differently: Inhale 2 puffs into the lungs every 6 (six) hours as needed for wheezing or  shortness of breath. 07/22/21   Margaretha Seeds, MD  amLODipine (NORVASC) 10 MG tablet Take 1 tablet (10 mg total) by mouth daily. 11/20/21   Marrian Salvage, FNP  aspirin 325 MG tablet Take 325 mg by mouth daily.    [provider]  benztropine (COGENTIN) 0.5 MG tablet Take 1 tablet (0.5 mg total) by mouth 2 (two) times daily. As needed for side effects of Prolixin 12/16/21   Ursula Alert, MD  Blood Pressure Monitoring (BLOOD PRESSURE CUFF) MISC Use daily as directed to check blood pressure 06/01/18   Marrian Salvage, FNP  celecoxib (CELEBREX) 50 MG capsule Take 1 capsule (50 mg total) by mouth 2 (two) times daily as needed for pain. 08/26/21   Marcial Pacas, MD  Cyanocobalamin (VITAMIN B-12 PO) Take 1 tablet by mouth daily.    [provider]  cyclobenzaprine (FLEXERIL) 10 MG tablet TAKE 1 TABLET BY MOUTH EVERYDAY AT BEDTIME 11/16/21   Marrian Salvage, FNP  diclofenac Sodium (VOLTAREN) 1 % GEL Apply 2 g topically 4 (four) times daily as needed (pain). 08/22/20   Marrian Salvage, FNP  dicyclomine (BENTYL) 20 MG tablet Take 1 tablet (20 mg total) by mouth 2 (two) times daily as needed for spasms. 11/04/21   Marcello Fennel, PA-C  esomeprazole (NEXIUM) 40 MG capsule TAKE 1 CAPSULE BY MOUTH EVERY DAY Patient taking differently: Take 40 mg by mouth daily. 09/08/21   Marrian Salvage, FNP  fluPHENAZine (PROLIXIN)  10 MG tablet Take 1 tablet (10 mg total) by mouth daily. 12/16/21   Ursula Alert, MD  fluticasone (FLONASE) 50 MCG/ACT nasal spray SPRAY 2 SPRAYS INTO EACH NOSTRIL EVERY DAY Patient taking differently: Place 2 sprays into both nostrils daily. 03/17/21   Marrian Salvage, FNP  gabapentin (NEURONTIN) 300 MG capsule Take 1 capsule (300 mg total) by mouth 2 (two) times daily. 11/20/21   Marrian Salvage, FNP  hydrOXYzine (ATARAX) 25 MG tablet Take 0.5-1 tablets (12.5-25 mg total) by mouth daily as needed for anxiety. Please limit use 12/16/21    Eappen, Ria Clock, MD  KLOR-CON M20 20 MEQ tablet TAKE 1 TABLET BY MOUTH EVERY DAY Patient taking differently: Take 20 mEq by mouth daily. 09/24/21   Marrian Salvage, FNP  ondansetron (ZOFRAN) 4 MG tablet Take 1 tablet (4 mg total) by mouth every 6 (six) hours. 11/04/21   Marcello Fennel, PA-C  rosuvastatin (CRESTOR) 10 MG tablet Take 1 tablet (10 mg total) by mouth daily. 11/20/21   Marrian Salvage, FNP  sildenafil (VIAGRA) 100 MG tablet Take 100 mg by mouth daily as needed for erectile dysfunction. 10/28/20   [provider]  SYMBICORT 160-4.5 MCG/ACT inhaler INHALE 2 PUFFS INTO THE LUNGS IN THE MORNING AND AT BEDTIME. TAKE 2 PUFFS BY MOUTH TWICE A DAY Patient taking differently: Inhale 2 puffs into the lungs in the morning and at bedtime. 09/24/21   Marrian Salvage, FNP  tamsulosin (FLOMAX) 0.4 MG CAPS capsule Take 0.4 mg by mouth daily. 09/29/18   [provider]  valsartan (DIOVAN) 160 MG tablet Take 1 tablet (160 mg total) by mouth daily. 08/22/20   Marrian Salvage, FNP  VITAMIN D PO Take 1 tablet by mouth daily.    [provider]      Allergies    Celecoxib, Iohexol, Ivp dye [iodinated contrast media], Metoclopramide hcl, Ritalin [methylphenidate], and Wellbutrin [bupropion]    Review of Systems   Review of Systems  Constitutional:  Negative for fever.  Gastrointestinal:  Negative for nausea and vomiting.  Musculoskeletal:  Positive for arthralgias and back pain.  Neurological:  Negative for dizziness, weakness, light-headedness and numbness.  All other systems reviewed and are negative.   Physical Exam Updated Vital Signs BP (!) 136/95 (BP Location: Right Arm)   Pulse (!) 104   Temp 98 F (36.7 C)   Resp 18   Ht '5\' 8"'$  (1.727 m)   Wt 104.3 kg   SpO2 99%   BMI 34.97 kg/m  Physical Exam Vitals and nursing note reviewed.  Constitutional:      General: He is not in acute distress.    Appearance: Normal appearance. He is not  ill-appearing, toxic-appearing or diaphoretic.  HENT:     Head: Normocephalic and atraumatic.     Nose: Nose normal. No congestion.     Mouth/Throat:     Mouth: Mucous membranes are moist.     Pharynx: Oropharynx is clear.  Eyes:     Extraocular Movements: Extraocular movements intact.     Conjunctiva/sclera: Conjunctivae normal.     Pupils: Pupils are equal, round, and reactive to light.  Cardiovascular:     Rate and Rhythm: Normal rate and regular rhythm.  Pulmonary:     Effort: Pulmonary effort is normal.     Breath sounds: Normal breath sounds. No wheezing.  Abdominal:     General: Abdomen is flat. Bowel sounds are normal.     Palpations: Abdomen is soft.  Tenderness: There is no abdominal tenderness.  Musculoskeletal:     Cervical back: Normal range of motion and neck supple. No tenderness.     Lumbar back: Tenderness present.     Right hip: Normal.     Comments: Patient with right-sided paraspinal lumbar tenderness.  Centralized spine palpated without findings of deformity, crepitus or step-off.  There is no centralized spinal pain.  There is no overlying skin change.  Patient right hip examined without any findings of deformity.  Patient has the ability to flex and extend his hip, abductor, adductor.  The patient is able to bear weight on his right foot.  No shortening or rotation.  Skin:    General: Skin is warm and dry.  Neurological:     General: No focal deficit present.     Mental Status: He is alert and oriented to person, place, and time.     GCS: GCS eye subscore is 4. GCS verbal subscore is 5. GCS motor subscore is 6.     Cranial Nerves: Cranial nerves 2-12 are intact. No cranial nerve deficit.     Sensory: Sensation is intact. No sensory deficit.     Motor: Motor function is intact. No weakness.     Coordination: Coordination is intact. Heel to [facility] Test normal.     ED Results / Procedures / Treatments   Labs (all labs ordered are listed, but only  abnormal results are displayed) Labs Reviewed - No data to display  EKG None  Radiology DG Hip Unilat W or Wo Pelvis 2-3 Views Right  Result Date: 12/31/2021 CLINICAL DATA:  Low back and RIGHT hip pain radiating to RIGHT leg, fell 3 months ago EXAM: DG HIP (WITH OR WITHOUT PELVIS) 2-3V RIGHT COMPARISON:  03/10/2021 FINDINGS: Osseous mineralization normal. Hip and SI joint spaces preserved. No acute fracture, dislocation, or bone destruction. Scattered vascular calcifications. IMPRESSION: No osseous abnormalities. Electronically Signed   By: Lavonia Dana M.D.   On: 12/31/2021 12:48   DG Lumbar Spine Complete  Result Date: 12/31/2021 CLINICAL DATA:  Low back and RIGHT hip pain radiating to RIGHT leg, fell 3 months ago EXAM: [place] 4+ VIEW COMPARISON:  03/10/2021 FINDINGS: Five non-rib-bearing lumbar vertebra. Osseous mineralization normal. Vertebral body and disc space heights maintained. Tiny superior endplate spurs at L4 and L5. No fracture, subluxation, bone destruction, or spondylolysis. SI joints preserved. Mild atherosclerotic calcifications aorta and iliac arteries. Surgical clips RIGHT upper quadrant likely reflect prior cholecystectomy. IMPRESSION: Minimal degenerative disc disease changes lumbar spine. No acute abnormalities. Aortic Atherosclerosis (ICD10-I70.0). Electronically Signed   By: Lavonia Dana M.D.   On: 12/31/2021 12:46    Procedures Procedures   Medications Ordered in ED Medications  lidocaine (LIDODERM) 5 % 1 patch (1 patch Transdermal Patch Applied 12/31/21 1320)  dexamethasone (DECADRON) injection 10 mg (10 mg Intramuscular Given 12/31/21 1320)  acetaminophen (TYLENOL) tablet 650 mg (650 mg Oral Given 12/31/21 1320)    ED Course/ Medical Decision Making/ A&P                           Medical Decision Making Risk OTC drugs. Prescription drug management.   66 year old now presents to ED for evaluation.  Please see HPI for further details.  Patient  reports 3 months of right-sided low back pain, right hip pain.  The patient is afebrile and nontachycardic.  The patient and sounds are clear bilaterally, he is not hypoxic.  Patient abdomen  soft and compressible throughout.  The patient does not have any overlying skin change about his lumbar spinal area.  There is no centralized spinal tenderness or step-off, deformity.  Patient does have paraspinal tenderness to the right.  No red flag symptoms.  The patient CT scan of his lumbar spine shows minimal degenerative disc disease.  No acute changes seen.  The patient right hip x-ray does not show any acute fracture or dislocation.  I agree with radiologist or potation.  Patient was treated with Lidoderm patch, Tylenol, Decadron shot.  Patient reports that he does not have diabetes.  During work-up, patient nurse alerted me that the patient was wishing to be discharged at this time.  Apparently the patient has a family obligation that he must attend.  The patient will be discharged home and advised to follow-up with his PCP.  The patient was given return precautions which he voiced understanding with.  The patient had all of his questions answered to satisfaction.  The patient stable at this time for discharge home.  Final Clinical Impression(s) / ED Diagnoses Final diagnoses:  Chronic right-sided low back pain with right-sided sciatica    Rx / DC Orders ED Discharge Orders     None         Lawana Chambers 12/31/21 1334    Elgie Congo, MD 12/31/21 2013

## 2021-12-31 NOTE — ED Provider Triage Note (Signed)
Emergency Medicine Provider Triage Evaluation Note  [name] , a 66 y.o. male  was evaluated in triage.  Pt complains of low back pain and right-sided hip pain x3 months.  Happened when he fell 3 months ago and landed on the fireplace.  Denies any prodromal symptoms, he did not hit his head.  He is able to walk but has significant pain on the right lower extremity, worse with movement.  Denies any saddle anesthesia, bilateral lower extremity weakness, urinary tension or fecal incontinence.  No previous surgeries to the low back.  Not on blood thinners..  Review of Systems  Per HPI3  Physical Exam  BP (!) 136/95 (BP Location: Right Arm)   Pulse (!) 104   Temp 98 F (36.7 C)   Resp 18   SpO2 99%  Gen:   Awake, no distress   Resp:  Normal effort  MSK:   Right hip tenderness but tolerates passive ROM.  Paraspinal tenderness, worse to right Other:  Cranial nerves II through XII grossly intact, lower extremity strength symmetric bilaterally, upper extremity strength symmetric bilaterally  Medical Decision Making  Medically screening exam initiated at 12:05 PM.  Appropriate orders placed.  [name] was informed that the remainder of the evaluation will be completed by another provider, this initial triage assessment does not replace that evaluation, and the importance of remaining in the ED until their evaluation is complete.     Sherrill Raring, PA-C 12/31/21 1206

## 2022-01-22 ENCOUNTER — Other Ambulatory Visit: Payer: Self-pay | Admitting: Pulmonary Disease

## 2022-02-15 ENCOUNTER — Other Ambulatory Visit: Payer: Self-pay | Admitting: Family

## 2022-02-28 ENCOUNTER — Other Ambulatory Visit: Payer: Self-pay | Admitting: Family

## 2022-03-14 ENCOUNTER — Other Ambulatory Visit: Payer: Self-pay | Admitting: Family

## 2022-04-06 ENCOUNTER — Ambulatory Visit (INDEPENDENT_AMBULATORY_CARE_PROVIDER_SITE_OTHER): Payer: Medicare Other | Admitting: Psychiatry

## 2022-04-06 ENCOUNTER — Encounter: Payer: Self-pay | Admitting: Psychiatry

## 2022-04-06 VITALS — BP 128/82 | HR 98 | Ht 68.0 in | Wt 245.0 lb

## 2022-04-06 DIAGNOSIS — F17200 Nicotine dependence, unspecified, uncomplicated: Secondary | ICD-10-CM | POA: Diagnosis not present

## 2022-04-06 DIAGNOSIS — F203 Undifferentiated schizophrenia: Secondary | ICD-10-CM | POA: Diagnosis not present

## 2022-04-06 DIAGNOSIS — F418 Other specified anxiety disorders: Secondary | ICD-10-CM

## 2022-04-06 NOTE — Patient Instructions (Addendum)
[facility]  [employer] / [place] / [employer] [address], [place] [id number] [phone]  [place] [place] [address], [place], [place] [address]  ~2.1 mi [phone]

## 2022-04-06 NOTE — Progress Notes (Signed)
[facility] MD OP Progress Note  04/06/2022 1:47 PM [name]  MRN:  [id number]  Chief Complaint:  Chief Complaint  Patient presents with   Follow-up   Medication Refill   Schizophrenia   HPI: [name] is a 66 year old African-American male on disability, lives in [place] with his wife, has a history of schizophrenia, essential hypertension, coronary artery disease, prostate cancer, chronic pain, history of multiple surgeries was evaluated in office today.  Patient today reports he is anxious about the fact that his car has broken down.  His wife dropped him off to the appointment today.  He would like to get it repaired however has financial issues.  That does worry him.  However he is coping okay.  Denies any depressive symptoms.  Reports sleep is good.  Reports appetite is fair.  Currently denies any hallucinations.  Compliant on his medications like Prolixin.  Denies side effects.  Reports he is currently cutting back on smoking however is not interested in any patches on medications to help with that.  Denies any suicidality or homicidality.  Denies any other concerns today.  Visit Diagnosis:    ICD-10-CM   1. Undifferentiated schizophrenia ([place])  F20.3     2. Other specified anxiety disorders  F41.8    Generalized anxiety not occurring more days than not.    3. Tobacco use disorder  F17.200       Past Psychiatric History: Reviewed past psychiatric history from progress note on 06/08/2021.  Multiple medication trials in the past including Navane, Haldol, Prolixin.  Patient has had multiple inpatient behavioral health admissions in the past.  Past Medical History:  Past Medical History:  Diagnosis Date   ALLERGIC RHINITIS 01/25/2007   ANXIETY DISORDER, GENERALIZED 01/25/2007   Arthritis    Chronic kidney disease    stage 3   COPD (chronic obstructive pulmonary disease) ([place])    DEPRESSION 01/25/2007   ESOPHAGITIS 12/28/2007   Fatty liver 10/09/2010   GERD  01/25/2007   GLUCOSE INTOLERANCE, HX OF 01/25/2007   Heart murmur    hx of    HEMORRHOIDS, INTERNAL, WITH BLEEDING 04/26/2008   Hiatal hernia    HYPERLIPIDEMIA 12/27/2007   HYPERTENSION, ESSENTIAL NOS 01/25/2007   Hyperthyroidism 08/11/2010   HYPERTROPHY PROSTATE W/UR OBST & OTH LUTS 02/27/2010   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 Zella Richer   COLONOSCOPY  04/23/2010   normal   COLONOSCOPY WITH PROPOFOL  01/22/2021   poor prep[   ESOPHAGOGASTRODUODENOSCOPY N/A 12/31/2013   Procedure: ESOPHAGOGASTRODUODENOSCOPY (EGD);  Surgeon: Gatha Mayer, MD;  Location: [facility];  Service: Endoscopy;  Laterality: N/A;   Excision of scalp lesion  07/18/2009   Excision of thyroid mass     GANGLION CYST EXCISION     right foot x 2   HERNIA REPAIR     ventral   INCISIONAL HERNIA REPAIR  12/01/2011   Procedure: LAPAROSCOPIC INCISIONAL HERNIA;  Surgeon: Harl Bowie, MD;  Location: [facility];  Service: General;  Laterality: N/A;   SHOULDER SURGERY     right   UPPER GASTROINTESTINAL ENDOSCOPY  Family Psychiatric History: Reviewed family psychiatric history from progress note on 06/08/2021.  Family History:  Family History  Problem Relation Age of Onset   Hypertension Mother    Bone cancer Mother    Hypertension Father    Kidney disease Father    Prostate cancer Brother    Bone cancer Brother    Kidney disease Brother    Mental illness Maternal Grandmother    Mental illness Son    Prostate cancer Other    Colon cancer Neg Hx    Colon polyps Neg Hx    Esophageal cancer Neg Hx     Rectal cancer Neg Hx    Stomach cancer Neg Hx     Social History: Reviewed social history from progress note on 06/08/2021. Social History   Socioeconomic History   Marital status: Married    Spouse name: [name]   Number of children: 2   Years of education: Not on file   Highest education level: Not on file  Occupational History   Occupation: Disabled    Employer: DISABLED  Tobacco Use   Smoking status: Every Day    Packs/day: 1.50    Years: 49.00    Total pack years: 73.50    Types: Cigarettes    Start date: 1971   Smokeless tobacco: Never   Tobacco comments:    Started smoking at age 69,still smoking pack - 1-1/2 packs  per day 01/08/2021  Vaping Use   Vaping Use: Never used  Substance and Sexual Activity   Alcohol use: No   Drug use: Yes    Frequency: 1.0 times per week    Types: Marijuana    Comment: last used 10/23/21   Sexual activity: Yes    Partners: Female  Other Topics Concern   Not on file  Social History Narrative   HSG disabled due to schizophrenia - 1983. stable long-term marriage with children. cared for his father-in-law- passed away in 07-25-22   Social Determinants of Health   Financial Resource Strain: Low Risk  (08/04/2021)   Overall Financial Resource Strain (CARDIA)    Difficulty of Paying Living Expenses: Not hard at all  Food Insecurity: No Food Insecurity (08/04/2021)   Hunger Vital Sign    Worried About Running Out of Food in the Last Year: Never true    Ran Out of Food in the Last Year: Never true  Transportation Needs: No Transportation Needs (08/04/2021)   PRAPARE - Hydrologist (Medical): No    Lack of Transportation (Non-Medical): No  Physical Activity: Inactive (08/04/2021)   Exercise Vital Sign    Days of Exercise per Week: 0 days    Minutes of Exercise per Session: 0 min  Stress: No Stress Concern Present (08/04/2021)   [place]    Feeling of  Stress : Not at all  Social Connections: Moderately Integrated (08/04/2021)   Social Connection and Isolation Panel [NHANES]    Frequency of Communication with Friends and Family: More than three times a week    Frequency of Social Gatherings with Friends and Family: Twice a week    Attends Religious Services: More than 4 times per year    Active Member of [employer] or Organizations: No    Attends Archivist Meetings: Never    Marital Status: Married    Allergies:  Allergies  Allergen Reactions   Celecoxib Other (See Comments)     rectal bleeding  Iohexol Other (See Comments)    Affects nerves: triggers schizophrenia   Ivp Dye [Iodinated Contrast Media]    Metoclopramide Hcl Other (See Comments)    Affects nerves: triggers schizophrenia   Ritalin [Methylphenidate]    Wellbutrin [Bupropion] Other (See Comments)    Makes pt smoke heavily    Metabolic Disorder Labs: Lab Results  Component Value Date   HGBA1C 5.7 08/01/2014   No results found for: "PROLACTIN" Lab Results  Component Value Date   CHOL 214 (H) 11/19/2021   TRIG 234.0 (H) 11/19/2021   HDL 38.60 (L) 11/19/2021   CHOLHDL 6 11/19/2021   VLDL 46.8 (H) 11/19/2021   LDLCALC 99 10/24/2018   LDLCALC 110 (H) 10/19/2017   Lab Results  Component Value Date   TSH 1.73 05/21/2021   TSH 1.440 11/16/2020    Therapeutic Level Labs: No results found for: "LITHIUM" No results found for: "VALPROATE" No results found for: "CBMZ"  Current Medications: Current Outpatient Medications  Medication Sig Dispense Refill   acetaminophen (TYLENOL) 500 MG tablet Take 1,000 mg by mouth every 6 (six) hours as needed for moderate pain.      albuterol (PROVENTIL) (2.5 MG/3ML) 0.083% nebulizer solution INHALE 3 ML BY NEBULIZATION EVERY 6 HOURS AS NEEDED FOR WHEEZING OR SHORTNESS OF BREATH (Patient taking differently: Take 3 mLs by nebulization every 6 (six) hours as needed for wheezing or shortness of breath.) 150 mL 1    albuterol (VENTOLIN HFA) 108 (90 Base) MCG/ACT inhaler Inhale 2 puffs into the lungs every 6 (six) hours as needed for wheezing or shortness of breath. 8.5 each 6   amLODipine (NORVASC) 10 MG tablet Take 1 tablet (10 mg total) by mouth daily. 90 tablet 3   aspirin 325 MG tablet Take 325 mg by mouth daily.     benztropine (COGENTIN) 0.5 MG tablet Take 1 tablet (0.5 mg total) by mouth 2 (two) times daily. As needed for side effects of Prolixin 180 tablet 1   Blood Pressure Monitoring (BLOOD PRESSURE CUFF) MISC Use daily as directed to check blood pressure 1 each 0   celecoxib (CELEBREX) 50 MG capsule Take 1 capsule (50 mg total) by mouth 2 (two) times daily as needed for pain. 60 capsule 6   Cyanocobalamin (VITAMIN B-12 PO) Take 1 tablet by mouth daily.     cyclobenzaprine (FLEXERIL) 10 MG tablet TAKE 1 TABLET BY MOUTH EVERYDAY AT BEDTIME 30 tablet 3   diclofenac Sodium (VOLTAREN) 1 % GEL Apply 2 g topically 4 (four) times daily as needed (pain). 150 g 1   dicyclomine (BENTYL) 20 MG tablet Take 1 tablet (20 mg total) by mouth 2 (two) times daily as needed for spasms. 20 tablet 0   esomeprazole (NEXIUM) 40 MG capsule TAKE 1 CAPSULE BY MOUTH EVERY DAY (Patient taking differently: Take 40 mg by mouth daily.) 90 capsule 3   fluPHENAZine (PROLIXIN) 10 MG tablet Take 1 tablet (10 mg total) by mouth daily. 90 tablet 1   fluticasone (FLONASE) 50 MCG/ACT nasal spray SPRAY 2 SPRAYS INTO EACH NOSTRIL EVERY DAY 48 mL 2   gabapentin (NEURONTIN) 300 MG capsule Take 1 capsule (300 mg total) by mouth 2 (two) times daily. 180 capsule 3   hydrOXYzine (ATARAX) 25 MG tablet Take 0.5-1 tablets (12.5-25 mg total) by mouth daily as needed for anxiety. Please limit use 90 tablet 1   KLOR-CON M20 20 MEQ tablet TAKE 1 TABLET BY MOUTH EVERY DAY (Patient taking differently: Take 20 mEq by mouth daily.) 90  tablet 3   ondansetron (ZOFRAN) 4 MG tablet Take 1 tablet (4 mg total) by mouth every 6 (six) hours. 12 tablet 0    rosuvastatin (CRESTOR) 10 MG tablet Take 1 tablet (10 mg total) by mouth daily. 90 tablet 3   sildenafil (VIAGRA) 100 MG tablet Take 100 mg by mouth daily as needed for erectile dysfunction.     SYMBICORT 160-4.5 MCG/ACT inhaler INHALE 2 PUFFS INTO THE LUNGS IN THE MORNING AND AT BEDTIME. TAKE 2 PUFFS BY MOUTH TWICE A DAY 30.6 each 1   tamsulosin (FLOMAX) 0.4 MG CAPS capsule Take 0.4 mg by mouth daily.     valsartan (DIOVAN) 160 MG tablet Take 1 tablet (160 mg total) by mouth daily. 90 tablet 1   VITAMIN D PO Take 1 tablet by mouth daily.     No current facility-administered medications for this visit.     Musculoskeletal: Strength & Muscle Tone: within normal limits Gait & Station: normal Patient leans: N/A  Psychiatric Specialty Exam: Review of Systems  Psychiatric/Behavioral:  The patient is nervous/anxious.   All other systems reviewed and are negative.   Blood pressure 128/82, pulse 98, height '5\' 8"'$  (1.727 m), weight 245 lb (111.1 kg).Body mass index is 37.25 kg/m.  General Appearance: Casual  Eye Contact:  Good  Speech:  Normal Rate  Volume:  Normal  Mood:  Anxious  Affect:  Appropriate  Thought Process:  Goal Directed and Descriptions of Associations: Intact  Orientation:  Full (Time, Place, and Person)  Thought Content: Logical   Suicidal Thoughts:  No  Homicidal Thoughts:  No  Memory:  Immediate;   Fair Recent;   Fair Remote;   Fair  Judgement:  Fair  Insight:  Fair  Psychomotor Activity:  Normal  Concentration:  Concentration: Good and Attention Span: Fair  Recall:  [employer] of Knowledge: Fair  Language: Fair  Akathisia:  No  Handed:  Right  AIMS (if indicated): done  Assets:  Communication Skills Desire for Improvement Housing Intimacy Social Support Talents/Skills  ADL's:  Intact  Cognition: WNL  Sleep:  Fair   Screenings: Administrator, Civil Service Office Visit from 04/06/2022 in [facility] Office Visit from 12/16/2021  in [facility] Office Visit from 09/15/2021 in [facility] Office Visit from 06/08/2021 in [facility] Total Score 0 0 0 0      [facility] Visit from 12/16/2021 in [facility] Office Visit from 06/08/2021 in [facility]  Total GAD-7 Score 4 0      PHQ2-9    [facility] Visit from 12/16/2021 in [facility] Visit from 09/15/2021 in [facility] from 08/04/2021 in [facility] Visit from 06/08/2021 in [facility] Visit from 11/20/2020 in [facility] at [facility] [place]  PHQ-2 Total Score 0 0 0 0 0  PHQ-9 Total Score -- -- -- 0 --      [facility] Visit from 04/06/2022 in [facility] ED from 12/31/2021 in [facility] Office Visit from 12/16/2021 in [facility] No Risk No Risk No Risk        Assessment and Plan: [name] is a 66 year old African-American male on disability, history of schizophrenia, hypertension, multiple  medical problems was evaluated in office today.  Patient although anxious currently coping well currently stable on medications.  Plan as noted below.  Plan Schizophrenia-stable Prolixin 10 mg p.o. daily Benztropine 0.5 mg p.o. twice daily as needed  Other specify anxiety disorder-generalized anxiety not occurring more days than not-stable Hydroxyzine 12.5-25 mg p.o. daily as needed for anxiety attacks  Tobacco use disorder-improving Provided counseling for 1 minute.  Patient is not interested in medications for smoking cessation.  Follow-up in clinic in 4 to 5 months or sooner if  needed.  This note was generated in part or whole with voice recognition software. Voice recognition is usually quite accurate but there are transcription errors that can and very often do occur. I apologize for any typographical errors that were not detected and corrected.      Ursula Alert, MD 04/06/2022, 1:47 PM

## 2022-04-21 ENCOUNTER — Other Ambulatory Visit: Payer: Self-pay | Admitting: *Deleted

## 2022-04-21 DIAGNOSIS — R0989 Other specified symptoms and signs involving the circulatory and respiratory systems: Secondary | ICD-10-CM

## 2022-04-21 DIAGNOSIS — R609 Edema, unspecified: Secondary | ICD-10-CM

## 2022-04-26 ENCOUNTER — Ambulatory Visit (INDEPENDENT_AMBULATORY_CARE_PROVIDER_SITE_OTHER)
Admission: RE | Admit: 2022-04-26 | Discharge: 2022-04-26 | Disposition: A | Discharge status: Home / Self Care | Payer: Medicare Other | Source: Ambulatory Visit | Attending: Surgery | Admitting: Surgery

## 2022-04-26 ENCOUNTER — Ambulatory Visit (INDEPENDENT_AMBULATORY_CARE_PROVIDER_SITE_OTHER): Payer: Medicare Other | Admitting: Physician Assistant

## 2022-04-26 ENCOUNTER — Ambulatory Visit (HOSPITAL_COMMUNITY)
Admission: RE | Admit: 2022-04-26 | Discharge: 2022-04-26 | Disposition: A | Discharge status: Home / Self Care | Payer: Medicare Other | Source: Ambulatory Visit | Attending: Surgery | Admitting: Surgery

## 2022-04-26 VITALS — BP 158/100 | HR 87 | Temp 98.2°F | Resp 20 | Ht 68.0 in | Wt 242.4 lb

## 2022-04-26 DIAGNOSIS — I739 Peripheral vascular disease, unspecified: Secondary | ICD-10-CM | POA: Diagnosis not present

## 2022-04-26 DIAGNOSIS — R609 Edema, unspecified: Secondary | ICD-10-CM

## 2022-04-26 DIAGNOSIS — I6523 Occlusion and stenosis of bilateral carotid arteries: Secondary | ICD-10-CM

## 2022-04-26 DIAGNOSIS — R0989 Other specified symptoms and signs involving the circulatory and respiratory systems: Secondary | ICD-10-CM | POA: Insufficient documentation

## 2022-04-26 LAB — VAS US ABI WITH/WO TBI
Left ABI: 0.79
Right ABI: 0.84

## 2022-04-26 NOTE — Progress Notes (Signed)
Established Carotid Patient   History of Present Illness   [name] is a 67 y.o. ([birth year]) male who presents for surveillance of carotid artery stenosis.  He has no history of TIA or stroke. He has been medically managed with ASA 325 mg and statin.  At follow-up today, he denies any recent changes to his health.  He is still compliant with all of his medications.  He still experiences chronic neck, back, and hip pain.  He denies any rest pain, claudication, or wounds of the lower extremities.  He denies any sudden weakness/numbness, slurred speech, or amaurosis fugax.  Current Outpatient Medications  Medication Sig Dispense Refill   acetaminophen (TYLENOL) 500 MG tablet Take 1,000 mg by mouth every 6 (six) hours as needed for moderate pain.      albuterol (PROVENTIL) (2.5 MG/3ML) 0.083% nebulizer solution INHALE 3 ML BY NEBULIZATION EVERY 6 HOURS AS NEEDED FOR WHEEZING OR SHORTNESS OF BREATH (Patient taking differently: Take 3 mLs by nebulization every 6 (six) hours as needed for wheezing or shortness of breath.) 150 mL 1   albuterol (VENTOLIN HFA) 108 (90 Base) MCG/ACT inhaler Inhale 2 puffs into the lungs every 6 (six) hours as needed for wheezing or shortness of breath. 8.5 each 6   amLODipine (NORVASC) 10 MG tablet Take 1 tablet (10 mg total) by mouth daily. 90 tablet 3   aspirin 325 MG tablet Take 325 mg by mouth daily.     benztropine (COGENTIN) 0.5 MG tablet Take 1 tablet (0.5 mg total) by mouth 2 (two) times daily. As needed for side effects of Prolixin 180 tablet 1   Blood Pressure Monitoring (BLOOD PRESSURE CUFF) MISC Use daily as directed to check blood pressure 1 each 0   celecoxib (CELEBREX) 50 MG capsule Take 1 capsule (50 mg total) by mouth 2 (two) times daily as needed for pain. 60 capsule 6   Cyanocobalamin (VITAMIN B-12 PO) Take 1 tablet by mouth daily.     cyclobenzaprine (FLEXERIL) 10 MG tablet TAKE 1 TABLET BY MOUTH EVERYDAY AT BEDTIME 30 tablet 3   diclofenac  Sodium (VOLTAREN) 1 % GEL Apply 2 g topically 4 (four) times daily as needed (pain). 150 g 1   dicyclomine (BENTYL) 20 MG tablet Take 1 tablet (20 mg total) by mouth 2 (two) times daily as needed for spasms. 20 tablet 0   esomeprazole (NEXIUM) 40 MG capsule TAKE 1 CAPSULE BY MOUTH EVERY DAY (Patient taking differently: Take 40 mg by mouth daily.) 90 capsule 3   fluPHENAZine (PROLIXIN) 10 MG tablet Take 1 tablet (10 mg total) by mouth daily. 90 tablet 1   fluticasone (FLONASE) 50 MCG/ACT nasal spray SPRAY 2 SPRAYS INTO EACH NOSTRIL EVERY DAY 48 mL 2   gabapentin (NEURONTIN) 300 MG capsule Take 1 capsule (300 mg total) by mouth 2 (two) times daily. 180 capsule 3   hydrOXYzine (ATARAX) 25 MG tablet Take 0.5-1 tablets (12.5-25 mg total) by mouth daily as needed for anxiety. Please limit use 90 tablet 1   KLOR-CON M20 20 MEQ tablet TAKE 1 TABLET BY MOUTH EVERY DAY (Patient taking differently: Take 20 mEq by mouth daily.) 90 tablet 3   ondansetron (ZOFRAN) 4 MG tablet Take 1 tablet (4 mg total) by mouth every 6 (six) hours. 12 tablet 0   rosuvastatin (CRESTOR) 10 MG tablet Take 1 tablet (10 mg total) by mouth daily. 90 tablet 3   sildenafil (VIAGRA) 100 MG tablet Take 100 mg by mouth daily as needed  for erectile dysfunction.     SYMBICORT 160-4.5 MCG/ACT inhaler INHALE 2 PUFFS INTO THE LUNGS IN THE MORNING AND AT BEDTIME. TAKE 2 PUFFS BY MOUTH TWICE A DAY 30.6 each 1   tamsulosin (FLOMAX) 0.4 MG CAPS capsule Take 0.4 mg by mouth daily.     valsartan (DIOVAN) 160 MG tablet Take 1 tablet (160 mg total) by mouth daily. 90 tablet 1   VITAMIN D PO Take 1 tablet by mouth daily.     No current facility-administered medications for this visit.    REVIEW OF SYSTEMS (negative unless checked):   Cardiac:  '[]'$  Chest pain or chest pressure? '[]'$  Shortness of breath upon activity? '[]'$  Shortness of breath when lying flat? '[]'$  Irregular heart rhythm?  Vascular:  '[]'$  Pain in calf, thigh, or hip brought on by  walking? '[]'$  Pain in feet at night that wakes you up from your sleep? '[]'$  Blood clot in your veins? '[]'$  Leg swelling?  Pulmonary:  '[]'$  Oxygen at home? '[]'$  Productive cough? '[]'$  Wheezing?  Neurologic:  '[]'$  Sudden weakness in arms or legs? '[]'$  Sudden numbness in arms or legs? '[]'$  Sudden onset of difficult speaking or slurred speech? '[]'$  Temporary loss of vision in one eye? '[]'$  Problems with dizziness?  Gastrointestinal:  '[]'$  Blood in stool? '[]'$  Vomited blood?  Genitourinary:  '[]'$  Burning when urinating? '[]'$  Blood in urine?  Psychiatric:  '[]'$  Major depression  Hematologic:  '[]'$  Bleeding problems? '[]'$  Problems with blood clotting?  Dermatologic:  '[]'$  Rashes or ulcers?  Constitutional:  '[]'$  Fever or chills?  Ear/Nose/Throat:  '[]'$  Change in hearing? '[]'$  Nose bleeds? '[]'$  Sore throat?  Musculoskeletal:  '[]'$  Back pain? '[]'$  Joint pain? '[]'$  Muscle pain?   Physical Examination   Vitals:   04/26/22 1538 04/26/22 1541  BP: (!) 152/94 (!) 158/100  Pulse: 87   Resp: 20   Temp: 98.2 F (36.8 C)   TempSrc: Temporal   SpO2: 98%   Weight: 242 lb 6.4 oz (110 kg)   Height: '5\' 8"'$  (1.727 m)    Body mass index is 36.86 kg/m.  General:  WDWN in NAD; vital signs documented above Gait: Not observed HENT: WNL, normocephalic Pulmonary: normal non-labored breathing Cardiac: regular HR, without carotid bruit Abdomen: soft, NT, no masses Skin: without rashes Vascular Exam/Pulses: Palpable DP pulses  Extremities: without ischemic changes, without gangrene , without cellulitis; without open wounds;  Musculoskeletal: no muscle wasting or atrophy  Neurologic: A&O X 3;  No focal weakness or paresthesias are detected Psychiatric:  The pt has Normal affect.  Non-Invasive Vascular Imaging   B Carotid Duplex (04/26/2022):  R ICA stenosis:  1-39% R VA:  patent and antegrade L ICA stenosis:  40-59% L VA:  patent and antegrade  ABI  (04/26/2022): +-------+-----------+-----------+------------+------------+  ABI/TBIToday's ABIToday's TBIPrevious ABIPrevious TBI  +-------+-----------+-----------+------------+------------+  Right 0.84       0.75                                 +-------+-----------+-----------+------------+------------+  Left  0.79       0.77                                 +-------+-----------+-----------+------------+------------+    Medical Decision Making   Eyden Dobie is a 67 y.o. male who presents for surveillance of known carotid artery stenosis  Based on the patient's vascular studies,  his bilateral carotid artery stenosis is unchanged.  His left ICA stenosis is 40 to 59% and right is 1 to 39% He denies any stroke or TIA-like symptoms.  He still takes his ASA '325mg'$  daily and statin He got baseline ABIs today due to hip pain when walking.  His ABIs demonstrate moderate arterial disease bilaterally with right ABI of 0.84 and left of 0.79.  He denies any rest pain, claudication, or nonhealing wounds of lower extremities.  When discussing his hip pain, this was discovered to be a chronic issue along with neck and back pain. We will continue to monitor his PAD and carotid artery stenosis.  He can follow-up with our office in 1 year with repeat carotid artery duplex study and ABIs    Vicente Serene PA-C [facility]: [facility] MD: Trula Slade

## 2022-05-05 ENCOUNTER — Other Ambulatory Visit: Payer: Self-pay | Admitting: Urology

## 2022-05-05 DIAGNOSIS — C61 Malignant neoplasm of prostate: Secondary | ICD-10-CM

## 2022-05-15 ENCOUNTER — Other Ambulatory Visit: Payer: Self-pay | Admitting: Psychiatry

## 2022-05-15 DIAGNOSIS — F203 Undifferentiated schizophrenia: Secondary | ICD-10-CM

## 2022-05-15 DIAGNOSIS — F418 Other specified anxiety disorders: Secondary | ICD-10-CM

## 2022-05-26 ENCOUNTER — Ambulatory Visit
Admission: RE | Admit: 2022-05-26 | Discharge: 2022-05-26 | Disposition: A | Discharge status: Home / Self Care | Payer: 59 | Source: Ambulatory Visit | Attending: Urology | Admitting: Urology

## 2022-05-26 ENCOUNTER — Other Ambulatory Visit: Payer: Medicare Other

## 2022-05-26 DIAGNOSIS — C61 Malignant neoplasm of prostate: Secondary | ICD-10-CM

## 2022-05-26 MED ORDER — GADOPICLENOL 0.5 MMOL/ML IV SOLN
10.0000 mL | Freq: Once | INTRAVENOUS | Status: AC | PRN
  Administered 2022-05-26: 10 mL via INTRAVENOUS

## 2022-06-13 ENCOUNTER — Other Ambulatory Visit: Payer: Self-pay | Admitting: Psychiatry

## 2022-06-13 DIAGNOSIS — F418 Other specified anxiety disorders: Secondary | ICD-10-CM

## 2022-06-13 DIAGNOSIS — F203 Undifferentiated schizophrenia: Secondary | ICD-10-CM

## 2022-07-12 ENCOUNTER — Other Ambulatory Visit: Payer: Self-pay | Admitting: Family

## 2022-07-12 ENCOUNTER — Other Ambulatory Visit: Payer: Self-pay | Admitting: Neurology

## 2022-07-14 ENCOUNTER — Other Ambulatory Visit: Payer: Self-pay | Admitting: Pulmonary Disease

## 2022-07-21 DIAGNOSIS — H2513 Age-related nuclear cataract, bilateral: Secondary | ICD-10-CM | POA: Diagnosis not present

## 2022-07-21 DIAGNOSIS — H524 Presbyopia: Secondary | ICD-10-CM | POA: Diagnosis not present

## 2022-07-21 DIAGNOSIS — H52223 Regular astigmatism, bilateral: Secondary | ICD-10-CM | POA: Diagnosis not present

## 2022-07-27 ENCOUNTER — Other Ambulatory Visit: Payer: Self-pay | Admitting: Family

## 2022-07-28 ENCOUNTER — Telehealth: Payer: Self-pay | Admitting: Family

## 2022-07-28 DIAGNOSIS — N183 Chronic kidney disease, stage 3 unspecified: Secondary | ICD-10-CM | POA: Diagnosis not present

## 2022-07-28 NOTE — Telephone Encounter (Signed)
Copied from CRM [id number]. Topic: Medicare AWV >> Jul 28, 2022  1:04 PM Payton Doughty wrote: Reason for CRM: Called patient to schedule Medicare Annual Wellness Visit (AWV). Left message for patient to call back and schedule Medicare Annual Wellness Visit (AWV).  Last date of AWV: 08/04/21  Please schedule an appointment at any time with Kandis Cocking, LPN .  If any questions, please contact me.  Thank you ,  Verlee Rossetti; Care Guide Ambulatory Clinical Support [place] [facility] Direct Dial: [phone]

## 2022-08-03 DIAGNOSIS — I129 Hypertensive chronic kidney disease with stage 1 through stage 4 chronic kidney disease, or unspecified chronic kidney disease: Secondary | ICD-10-CM | POA: Diagnosis not present

## 2022-08-03 DIAGNOSIS — N1831 Chronic kidney disease, stage 3a: Secondary | ICD-10-CM | POA: Diagnosis not present

## 2022-08-03 DIAGNOSIS — Z720 Tobacco use: Secondary | ICD-10-CM | POA: Diagnosis not present

## 2022-08-03 DIAGNOSIS — G8929 Other chronic pain: Secondary | ICD-10-CM | POA: Diagnosis not present

## 2022-08-03 DIAGNOSIS — E559 Vitamin D deficiency, unspecified: Secondary | ICD-10-CM | POA: Diagnosis not present

## 2022-08-08 ENCOUNTER — Emergency Department (HOSPITAL_BASED_OUTPATIENT_CLINIC_OR_DEPARTMENT_OTHER)
Admission: EM | Admit: 2022-08-08 | Discharge: 2022-08-08 | Disposition: A | Discharge status: Home / Self Care | Payer: 59 | Attending: Emergency Medicine | Admitting: Emergency Medicine

## 2022-08-08 ENCOUNTER — Emergency Department (HOSPITAL_BASED_OUTPATIENT_CLINIC_OR_DEPARTMENT_OTHER): Payer: 59

## 2022-08-08 ENCOUNTER — Other Ambulatory Visit: Payer: Self-pay

## 2022-08-08 ENCOUNTER — Encounter (HOSPITAL_BASED_OUTPATIENT_CLINIC_OR_DEPARTMENT_OTHER): Payer: Self-pay

## 2022-08-08 DIAGNOSIS — J449 Chronic obstructive pulmonary disease, unspecified: Secondary | ICD-10-CM | POA: Diagnosis not present

## 2022-08-08 DIAGNOSIS — N189 Chronic kidney disease, unspecified: Secondary | ICD-10-CM | POA: Insufficient documentation

## 2022-08-08 DIAGNOSIS — E039 Hypothyroidism, unspecified: Secondary | ICD-10-CM | POA: Insufficient documentation

## 2022-08-08 DIAGNOSIS — R109 Unspecified abdominal pain: Secondary | ICD-10-CM | POA: Diagnosis not present

## 2022-08-08 DIAGNOSIS — I129 Hypertensive chronic kidney disease with stage 1 through stage 4 chronic kidney disease, or unspecified chronic kidney disease: Secondary | ICD-10-CM | POA: Insufficient documentation

## 2022-08-08 DIAGNOSIS — K469 Unspecified abdominal hernia without obstruction or gangrene: Secondary | ICD-10-CM | POA: Insufficient documentation

## 2022-08-08 DIAGNOSIS — K439 Ventral hernia without obstruction or gangrene: Secondary | ICD-10-CM

## 2022-08-08 LAB — COMPREHENSIVE METABOLIC PANEL
ALT: 15 U/L (ref 0–44)
AST: 16 U/L (ref 15–41)
Albumin: 3.8 g/dL (ref 3.5–5.0)
Alkaline Phosphatase: 76 U/L (ref 38–126)
Anion gap: 9 (ref 5–15)
BUN: 7 mg/dL — ABNORMAL LOW (ref 8–23)
CO2: 27 mmol/L (ref 22–32)
Calcium: 8.8 mg/dL — ABNORMAL LOW (ref 8.9–10.3)
Chloride: 101 mmol/L (ref 98–111)
Creatinine, Ser: 1.29 mg/dL — ABNORMAL HIGH (ref 0.61–1.24)
GFR, Estimated: >60 mL/min (ref >60)
Glucose, Bld: 121 mg/dL — ABNORMAL HIGH (ref 70–99)
Potassium: 3.5 mmol/L (ref 3.5–5.1)
Sodium: 137 mmol/L (ref 135–145)
Total Bilirubin: 0.4 mg/dL (ref 0.3–1.2)
Total Protein: 7.2 g/dL (ref 6.5–8.1)

## 2022-08-08 LAB — CBC
HCT: 40.6 % (ref 39.0–52.0)
Hemoglobin: 13.7 g/dL (ref 13.0–17.0)
MCH: 28.8 pg (ref 26.0–34.0)
MCHC: 33.7 g/dL (ref 30.0–36.0)
MCV: 85.5 fL (ref 80.0–100.0)
Platelets: 310 10*3/uL (ref 150–400)
RBC: 4.75 MIL/uL (ref 4.22–5.81)
RDW: 14.6 % (ref 11.5–15.5)
WBC: 12 10*3/uL — ABNORMAL HIGH (ref 4.0–10.5)
nRBC: 0 % (ref 0.0–0.2)

## 2022-08-08 LAB — URINALYSIS, ROUTINE W REFLEX MICROSCOPIC
Bilirubin Urine: NEGATIVE
Glucose, UA: NEGATIVE mg/dL
Hgb urine dipstick: NEGATIVE
Ketones, ur: NEGATIVE mg/dL
Leukocytes,Ua: NEGATIVE
Nitrite: NEGATIVE
Protein, ur: NEGATIVE mg/dL
Specific Gravity, Urine: 1.015 (ref 1.005–1.030)
pH: 7 (ref 5.0–8.0)

## 2022-08-08 LAB — LIPASE, BLOOD: Lipase: 23 U/L (ref 11–51)

## 2022-08-08 LAB — LACTIC ACID, PLASMA: Lactic Acid, Venous: 1.5 mmol/L (ref 0.5–1.9)

## 2022-08-08 MED ORDER — MORPHINE SULFATE (PF) 4 MG/ML IV SOLN
4.0000 mg | Freq: Once | INTRAVENOUS | Status: AC
  Administered 2022-08-08: 4 mg via INTRAVENOUS
  Filled 2022-08-08: qty 1

## 2022-08-08 MED ORDER — ONDANSETRON HCL 4 MG/2ML IJ SOLN
4.0000 mg | Freq: Once | INTRAMUSCULAR | Status: AC | PRN
  Administered 2022-08-08: 4 mg via INTRAVENOUS
  Filled 2022-08-08: qty 2

## 2022-08-08 MED ORDER — DICYCLOMINE HCL 10 MG PO CAPS
10.0000 mg | ORAL_CAPSULE | Freq: Once | ORAL | Status: AC
  Administered 2022-08-08: 10 mg via ORAL
  Filled 2022-08-08: qty 1

## 2022-08-08 MED ORDER — OXYCODONE-ACETAMINOPHEN 5-325 MG PO TABS
1.0000 | ORAL_TABLET | Freq: Once | ORAL | Status: AC
  Administered 2022-08-08: 1 via ORAL
  Filled 2022-08-08: qty 1

## 2022-08-08 MED ORDER — DICYCLOMINE HCL 20 MG PO TABS: 20.0000 mg | ORAL_TABLET | Freq: Two times a day (BID) | ORAL | 0 refills | Status: DC

## 2022-08-08 MED ORDER — ONDANSETRON 4 MG PO TBDP: 4.0000 mg | ORAL_TABLET | Freq: Three times a day (TID) | ORAL | 0 refills | Status: DC | PRN

## 2022-08-08 MED ORDER — LACTATED RINGERS IV BOLUS
1000.0000 mL | Freq: Once | INTRAVENOUS | Status: AC
  Administered 2022-08-08: 1000 mL via INTRAVENOUS

## 2022-08-08 NOTE — ED Triage Notes (Signed)
Pt has ABD pain with N/V - pain is around his umbilical hernia which he states is a lot worse today.  He has had hernia for approx 15 years and has had surgeries on it before.

## 2022-08-08 NOTE — ED Provider Notes (Signed)
[place] [facility] Provider Note   CSN: [id number] Arrival date & time: 08/08/22  1918     History  Chief Complaint  Patient presents with   Abdominal Pain    Nefi Mollica is a 67 y.o. male.  With PMH of thyroid disease, HTN, HLD, COPD,CKD,  history of incisional hernia repair who presents with pain at hernia site associated with vomiting earlier today.  Patient said he was developing some discomfort around his hernia site last night and felt like it was larger and unable to be reduced followed by episodes of nausea and nonbloody nonbilious emesis.  He has been having normal bowel movements and passing gas.  Since this started he has been laying down his hernia has being able to be reduced and is soft again.  He is no longer having significant pain.  He has had no fevers, no chills, no chest pain, no shortness of breath, no URI symptoms and no urinary symptoms.  He cannot get it fixed because he will not stop smoking.  He does not wear belly binders.   Abdominal Pain      Home Medications Prior to Admission medications   Medication Sig Start Date End Date Taking? Authorizing Provider  dicyclomine (BENTYL) 20 MG tablet Take 1 tablet (20 mg total) by mouth 2 (two) times daily. 08/08/22  Yes Mardene Sayer, MD  acetaminophen (TYLENOL) 500 MG tablet Take 1,000 mg by mouth every 6 (six) hours as needed for moderate pain.     [provider]  albuterol (PROVENTIL) (2.5 MG/3ML) 0.083% nebulizer solution INHALE 3 ML BY NEBULIZATION EVERY 6 HOURS AS NEEDED FOR WHEEZING OR SHORTNESS OF BREATH 07/27/22   Olive Bass, FNP  albuterol (VENTOLIN HFA) 108 (90 Base) MCG/ACT inhaler TAKE 2 PUFFS BY MOUTH EVERY 6 HOURS AS NEEDED FOR WHEEZE OR SHORTNESS OF BREATH 07/14/22   Luciano Cutter, MD  amLODipine (NORVASC) 10 MG tablet Take 1 tablet (10 mg total) by mouth daily. 11/20/21   Olive Bass, FNP  aspirin 325 MG tablet Take 325 mg  by mouth daily.    [provider]  benztropine (COGENTIN) 0.5 MG tablet Take 1 tablet (0.5 mg total) by mouth 2 (two) times daily. As needed for side effects of Prolixin 12/16/21   Jomarie Longs, MD  Blood Pressure Monitoring (BLOOD PRESSURE CUFF) MISC Use daily as directed to check blood pressure 06/01/18   Olive Bass, FNP  celecoxib (CELEBREX) 50 MG capsule Take 1 capsule (50 mg total) by mouth 2 (two) times daily as needed for pain. 08/26/21   Levert Feinstein, MD  Cyanocobalamin (VITAMIN B-12 PO) Take 1 tablet by mouth daily.    [provider]  cyclobenzaprine (FLEXERIL) 10 MG tablet TAKE 1 TABLET BY MOUTH EVERYDAY AT BEDTIME 07/15/22   Worthy Rancher B, FNP  diclofenac Sodium (VOLTAREN) 1 % GEL Apply 2 g topically 4 (four) times daily as needed (pain). 08/22/20   Olive Bass, FNP  dicyclomine (BENTYL) 20 MG tablet Take 1 tablet (20 mg total) by mouth 2 (two) times daily as needed for spasms. 11/04/21   Carroll Sage, PA-C  esomeprazole (NEXIUM) 40 MG capsule TAKE 1 CAPSULE BY MOUTH EVERY DAY Patient taking differently: Take 40 mg by mouth daily. 09/08/21   Olive Bass, FNP  fluPHENAZine (PROLIXIN) 10 MG tablet TAKE 1 TABLET BY MOUTH EVERY DAY 05/17/22   Jomarie Longs, MD  fluticasone (FLONASE) 50 MCG/ACT nasal spray SPRAY  2 SPRAYS INTO EACH NOSTRIL EVERY DAY 02/16/22   Olive Bass, FNP  gabapentin (NEURONTIN) 300 MG capsule Take 1 capsule (300 mg total) by mouth 2 (two) times daily. 11/20/21   Olive Bass, FNP  hydrOXYzine (ATARAX) 25 MG tablet Take 0.5-1 tablets (12.5-25 mg total) by mouth daily as needed for anxiety. Please limit use 06/13/22 09/11/22  Hisada, Barbee Cough, MD  KLOR-CON M20 20 MEQ tablet TAKE 1 TABLET BY MOUTH EVERY DAY Patient taking differently: Take 20 mEq by mouth daily. 09/24/21   Olive Bass, FNP  ondansetron (ZOFRAN) 4 MG tablet Take 1 tablet (4 mg total) by mouth every 6 (six) hours. 11/04/21    Carroll Sage, PA-C  ondansetron (ZOFRAN-ODT) 4 MG disintegrating tablet Take 1 tablet (4 mg total) by mouth every 8 (eight) hours as needed. 08/08/22   Mardene Sayer, MD  rosuvastatin (CRESTOR) 10 MG tablet Take 1 tablet (10 mg total) by mouth daily. 11/20/21   Olive Bass, FNP  sildenafil (VIAGRA) 100 MG tablet Take 100 mg by mouth daily as needed for erectile dysfunction. 10/28/20   [provider]  SYMBICORT 160-4.5 MCG/ACT inhaler INHALE 2 PUFFS INTO THE LUNGS IN THE MORNING AND AT BEDTIME. TAKE 2 PUFFS BY MOUTH TWICE A DAY 02/16/22   Olive Bass, FNP  tamsulosin (FLOMAX) 0.4 MG CAPS capsule Take 0.4 mg by mouth daily. 09/29/18   [provider]  valsartan (DIOVAN) 160 MG tablet Take 1 tablet (160 mg total) by mouth daily. 03/01/22   Olive Bass, FNP  VITAMIN D PO Take 1 tablet by mouth daily.    [provider]      Allergies    Celecoxib, Iohexol, Ivp dye [iodinated contrast media], Metoclopramide hcl, Ritalin [methylphenidate], and Wellbutrin [bupropion]    Review of Systems   Review of Systems  Gastrointestinal:  Positive for abdominal pain.    Physical Exam Updated Vital Signs BP (!) 146/99 (BP Location: Left Arm)   Pulse 95   Temp 98.6 F (37 C) (Oral)   Resp 17   Ht  (1.753 m)   Wt 112 kg   SpO2 90%   BMI 36.48 kg/m  Physical Exam Constitutional: Alert and oriented. Well appearing and in no distress. Eyes: Conjunctivae are normal. ENT      Head: Normocephalic and atraumatic. Cardiovascular: Regular rate and rhythm Respiratory: Normal respiratory effort. Breath sounds are normal.  O2 sat 100 on RA Gastrointestinal: Obese and soft.  Midline abdominal surgical scar/ex lap scar clean dry and intact.  There is a ventral abdominal wall hernia at the superior site of previous surgical scar that is easily reducible and soft.  There is no external skin changes.  Present.  No pain present on abdominal exam.   Abdominal exam is reassuring and not peritonitic. Musculoskeletal: Normal range of motion in all extremities. Neurologic: Normal speech and language. No gross focal neurologic deficits are appreciated. Skin: Skin is warm, dry and intact. No rash noted. Psychiatric: Mood and affect are normal. Speech and behavior are normal.  ED Results / Procedures / Treatments   Labs (all labs ordered are listed, but only abnormal results are displayed) Labs Reviewed  COMPREHENSIVE METABOLIC PANEL - Abnormal; Notable for the following components:      Result Value   Glucose, Bld 121 (*)    BUN 7 (*)    Creatinine, Ser 1.29 (*)    Calcium 8.8 (*)    All other components within normal  limits  CBC - Abnormal; Notable for the following components:   WBC 12.0 (*)    All other components within normal limits  LIPASE, BLOOD  LACTIC ACID, PLASMA  URINALYSIS, ROUTINE W REFLEX MICROSCOPIC    EKG None  Radiology CT ABDOMEN PELVIS WO CONTRAST  Result Date: 08/08/2022 CLINICAL DATA:  Pain around known umbilical hernia. EXAM: CT ABDOMEN AND PELVIS WITHOUT CONTRAST TECHNIQUE: Multidetector CT imaging of the abdomen and pelvis was performed following the standard protocol without IV contrast. RADIATION DOSE REDUCTION: This exam was performed according to the departmental dose-optimization program which includes automated exposure control, adjustment of the mA and/or kV according to patient size and/or use of iterative reconstruction technique. COMPARISON:  11/03/2021. FINDINGS: Lower chest: Linear opacity left lung bases likely scar or atelectasis. No pleural effusion. Small pericardial effusion is similar to previous. Coronary artery calcifications are seen. Small hiatal hernia. Hepatobiliary: Previous cholecystectomy. No focal liver abnormality on this noncontrast exam. Pancreas: Moderate global atrophy of the pancreas, unchanged from previous. Spleen: Normal in size without focal abnormality. Adrenals/Urinary  Tract: Adrenal glands are preserved. No abnormal calcifications are seen within either kidney nor along the course of either ureter. Preserved contours of the urinary bladder. Stomach/Bowel: On this non oral contrast exam, large bowel has a normal course and caliber. Moderate colonic stool. Few left-sided colonic diverticula. The appendix is not clearly seen in the right lower quadrant but no pericecal stranding or fluid. Stomach is nondilated. Small bowel is nondilated. Vascular/Lymphatic: Scattered vascular calcifications are seen along the aorta and branch vessels. Significant areas of stenosis suggested along the internal iliac vessels in particular. Minimal ectasia of the infrarenal abdominal aorta measuring up to 2.8 cm, similar to previous. Preserved IVC. No specific abnormal lymph node enlargement identified in the abdomen and pelvis. Reproductive: Enlarged lobular prostate with mass effect along the bladder. Please correlate with recent prostate MRI from 05/26/2022 describing suspicious lesions. Other: Rectus muscle diastasis identified epigastric with fat containing hernia. Additional focus near the umbilicus. Musculoskeletal: Mild degenerative changes along the spine pelvis. IMPRESSION: No bowel obstruction, free air or free fluid. Few colonic diverticula. Stable epigastric fat containing midline abdominal wall hernia with rectus muscle diastasis. Enlarged prostate with lobular contours. Please correlate with findings of the prostate MRI of 05/26/2022 describing suspicious lesions. Minimal ectasia of the abdominal aorta without aneurysm measuring up to 2.8 cm in diameter. Recommend follow-up ultrasound every 5 years. This recommendation follows ACR consensus guidelines: White Paper of the ACR Incidental Findings Committee II on Vascular Findings. J Am Coll Radiol 2013; 10:789-794. Electronically Signed   By: Karen Kays M.D.   On: 08/08/2022 20:46    Procedures Procedures    Medications Ordered in  ED Medications  ondansetron [facility]) injection 4 mg (4 mg Intravenous Given 08/08/22 2042)  lactated ringers bolus 1,000 mL (0 mLs Intravenous Stopped 08/08/22 2156)  morphine (PF) 4 MG/ML injection 4 mg (4 mg Intravenous Given 08/08/22 2113)  oxyCODONE-acetaminophen (PERCOCET/ROXICET) 5-325 MG per tablet 1 tablet (1 tablet Oral Given 08/08/22 2206)  dicyclomine (BENTYL) capsule 10 mg (10 mg Oral Given 08/08/22 2206)    ED Course/ Medical Decision Making/ A&P                             Medical Decision Making  Sourish Allender is a 67 y.o. male.  With PMH of thyroid disease, HTN, HLD, COPD,CKD,  history of incisional hernia repair who presents with pain at  hernia site associated with vomiting earlier today.    Based on patient's history and presentation, consider incarcerated hernia, strangulated hernia, bowel obstruction, gastroenteritis.  His physical exam was very reassuring.  His abdominal exam was benign with no localizing tenderness it was not peritonitic and he has an easily reducible abdominal wall hernia at the superior site of his previous surgical site.  Reassuring exam with reducible hernia no skin changes and normal lactate 1.5 with a CTAP without contrast with no concerning findings no bowel obstruction which I personally reviewed, doubt strangulated or incarcerated hernia especially with as discussed above.  His symptoms could be related to a mild gastroenteritis.  He had a mildly elevated white blood cell count 12.0.  Creatinine 1.29 at baseline.  No acute electrolyte abnormalities.  Lipase 23 within normal limits no concern for pancreatitis.  No transaminitis.  UA with no evidence of UTI.  Patient feeling better after medications here.  Hernia remains reducible.  Provided abdominal binder and advised follow-up with surgeon as well as continued supportive care for symptoms with Zofran and Bentyl.  Return precautions discussed.    Amount and/or Complexity of Data Reviewed Labs:  ordered. Radiology: ordered.  Risk Prescription drug management.      Final Clinical Impression(s) / ED Diagnoses Final diagnoses:  Abdominal wall hernia    Rx / DC Orders ED Discharge Orders          Ordered    ondansetron (ZOFRAN-ODT) 4 MG disintegrating tablet  Every 8 hours PRN,   Status:  Discontinued        08/08/22 2217    ondansetron (ZOFRAN-ODT) 4 MG disintegrating tablet  Every 8 hours PRN        08/08/22 2247    dicyclomine (BENTYL) 20 MG tablet  2 times daily        08/08/22 2247              Mardene Sayer, MD 08/09/22 [id number]

## 2022-08-08 NOTE — Discharge Instructions (Addendum)
You have been seen in the Emergency Department (ED)  today for nausea and vomiting with pain.  Your work up today has not shown a clear cause for your symptoms, but they may be due to a viral infection. You have been prescribed Zofran; please use as prescribed as needed for your nausea.  You can take the Bentyl as needed for abdominal cramping.  Continue to wear the Ace wrap's to help support your abdominal hernia.  You may need to buy a abdominal binder at a medical supply store.  You need to discuss your recurrent hernia with your surgeon.  Follow up with your doctor as soon as possible, ideally within one week, regarding today's emergent visit and your symptoms of nausea/vomiting.   Return to the Emergency Department (ED)  if you develop severe abdominal pain, a firm irreducible abdominal hernia, abdominal skin changes around your hernia site, bloody vomiting, bloody diarrhea, if you are unable to tolerate fluids due to vomiting, or if you develop other symptoms that concern you.

## 2022-08-17 ENCOUNTER — Ambulatory Visit (INDEPENDENT_AMBULATORY_CARE_PROVIDER_SITE_OTHER): Payer: 59 | Admitting: Psychiatry

## 2022-08-17 ENCOUNTER — Encounter: Payer: Self-pay | Admitting: Psychiatry

## 2022-08-17 VITALS — BP 116/76 | HR 96 | Temp 98.5°F | Ht 69.0 in | Wt 249.4 lb

## 2022-08-17 DIAGNOSIS — F17200 Nicotine dependence, unspecified, uncomplicated: Secondary | ICD-10-CM

## 2022-08-17 DIAGNOSIS — F418 Other specified anxiety disorders: Secondary | ICD-10-CM

## 2022-08-17 DIAGNOSIS — F203 Undifferentiated schizophrenia: Secondary | ICD-10-CM | POA: Diagnosis not present

## 2022-08-17 MED ORDER — HYDROXYZINE HCL 25 MG PO TABS: 12.5000 mg | ORAL_TABLET | Freq: Every day | ORAL | 0 refills | Status: DC | PRN

## 2022-08-17 NOTE — Progress Notes (Signed)
BH MD OP Progress Note  08/17/2022 2:30 PM [name]  MRN:  [id number]  Chief Complaint:  Chief Complaint  Patient presents with   Follow-up   Hallucinations   Medication Refill   HPI: [name] is a 67 year old African-American male on disability, lives in [place] with his wife, has a history of schizophrenia, essential hypertension, coronary artery disease, prostate cancer, chronic pain, history of multiple surgeries was evaluated in office today.  Patient today reports he is currently struggling with shoulder pain, initially it was the right shoulder however recently it has been his left shoulder.  He is planning to get that evaluated.  The pain does have an impact on his sleep at night.  However he is not interested in starting a sleep medication.  He reports he is managing okay and will let writer know if he is interested.  Patient denies any depression symptoms.  He is anxious about his health although managing well.  Denies any hallucinations.  Did not appear to be preoccupied with any delusions, paranoia.  Patient denies any suicidality, homicidality.  Patient is compliant on medications like Prolixin.  Uses the hydroxyzine as needed.  Continues to smoke cigarettes, not interested in quitting.  Patient denies any other concerns today.  Visit Diagnosis:    ICD-10-CM   1. Undifferentiated schizophrenia (HCC)  F20.3 hydrOXYzine (ATARAX) 25 MG tablet    2. Other specified anxiety disorders  F41.8    generalized anxiety not occurring more days than not    3. Tobacco use disorder  F17.200       Past Psychiatric History: I have reviewed past psychiatric history from progress note on 06/08/2021.  Multiple medication trials in the past including Navane, Prolixin.  Patient has had multiple inpatient behavioral health admissions in the past.  Past Medical History:  Past Medical History:  Diagnosis Date   ALLERGIC RHINITIS 01/25/2007   ANXIETY DISORDER, GENERALIZED  01/25/2007   Arthritis    Chronic kidney disease    stage 3   COPD (chronic obstructive pulmonary disease) (HCC)    DEPRESSION 01/25/2007   ESOPHAGITIS 12/28/2007   Fatty liver 10/09/2010   GERD 01/25/2007   GLUCOSE INTOLERANCE, HX OF 01/25/2007   Heart murmur    hx of    HEMORRHOIDS, INTERNAL, WITH BLEEDING 04/26/2008   Hiatal hernia    HYPERLIPIDEMIA 12/27/2007   HYPERTENSION, ESSENTIAL NOS 01/25/2007   Hyperthyroidism 08/11/2010   HYPERTROPHY PROSTATE W/UR OBST & OTH LUTS 02/27/2010   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 Abbey Chatters   COLONOSCOPY  04/23/2010   normal   COLONOSCOPY WITH PROPOFOL  01/22/2021   poor prep[   ESOPHAGOGASTRODUODENOSCOPY N/A 12/31/2013   Procedure: ESOPHAGOGASTRODUODENOSCOPY (EGD);  Surgeon: Iva Boop, MD;  Location: [facility];  Service: Endoscopy;  Laterality: N/A;   Excision of scalp lesion  07/18/2009   Excision of thyroid mass     GANGLION CYST EXCISION     right foot x 2   HERNIA REPAIR     ventral   INCISIONAL HERNIA REPAIR  12/01/2011   Procedure: LAPAROSCOPIC INCISIONAL HERNIA;  Surgeon: Shelly Rubenstein, MD;  Location: [facility] OR;  Service: General;  Laterality: N/A;   SHOULDER SURGERY     right   UPPER GASTROINTESTINAL ENDOSCOPY      Family Psychiatric History: Reviewed family psychiatric history from progress note on 06/08/2021.  Family History:  Family History  Problem Relation Age of Onset   Hypertension Mother    Bone cancer Mother    Hypertension Father    Kidney disease Father    Prostate cancer Brother    Bone cancer  Brother    Kidney disease Brother    Mental illness Maternal Grandmother    Mental illness Son    Prostate cancer Other    Colon cancer Neg Hx    Colon polyps Neg Hx    Esophageal cancer Neg Hx    Rectal cancer Neg Hx    Stomach cancer Neg Hx     Social History: Reviewed social history from progress note on 06/08/2021. Social History   Socioeconomic History   Marital status: Married    Spouse name: [name]   Number of children: 2   Years of education: Not on file   Highest education level: Not on file  Occupational History   Occupation: Disabled    Employer: DISABLED  Tobacco Use   Smoking status: Every Day    Packs/day: 1.50    Years: 49.00    Additional pack years: 0.00    Total pack years: 73.50    Types: Cigarettes    Start date: 23[birth year]1969    Passive exposure: Never   Smokeless tobacco: Never   Tobacco comments:    Started smoking at age 50,still smoking pack - 1-1/2 packs  per day 01/08/2021  Vaping Use   Vaping Use: Never used  Substance and Sexual Activity   Alcohol use: No   Drug use: Yes    Frequency: 1.0 times per week    Types: Marijuana    Comment: last used 10/23/21   Sexual activity: Yes    Partners: Female  Other Topics Concern   Not on file  Social History Narrative   HSG disabled due to schizophrenia - September 23, 1981. stable long-term marriage with children. cared for his father-in-law- passed away in Sep 24, 2022   Social Determinants of Health   Financial Resource Strain: Low Risk  (08/04/2021)   Overall Financial Resource Strain (CARDIA)    Difficulty of Paying Living Expenses: Not hard at all  Food Insecurity: No Food Insecurity (08/04/2021)   Hunger Vital Sign    Worried About Running Out of Food in the Last Year: Never true    Ran Out of Food in the Last Year: Never true  Transportation Needs: No Transportation Needs (08/04/2021)   PRAPARE - Administrator, Civil Service (Medical): No    Lack of Transportation (Non-Medical): No  Physical Activity:  Inactive (08/04/2021)   Exercise Vital Sign    Days of Exercise per Week: 0 days    Minutes of Exercise per Session: 0 min  Stress: No Stress Concern Present (08/04/2021)   [employer] of Occupational Health - Occupational Stress Questionnaire    Feeling of Stress : Not at all  Social Connections: Moderately Integrated (08/04/2021)   Social Connection and Isolation Panel [NHANES]    Frequency of Communication with Friends and Family: More than three times a week    Frequency of Social Gatherings with Friends and Family: Twice a week    Attends Religious Services: More than 4 times per year  Active Member of Clubs or Organizations: No    Attends Banker Meetings: Never    Marital Status: Married    Allergies:  Allergies  Allergen Reactions   Celecoxib Other (See Comments)     rectal bleeding   Iohexol Other (See Comments)    Affects nerves: triggers schizophrenia   Ivp Dye [Iodinated Contrast Media]    Metoclopramide Hcl Other (See Comments)    Affects nerves: triggers schizophrenia   Ritalin [Methylphenidate]    Wellbutrin [Bupropion] Other (See Comments)    Makes pt smoke heavily    Metabolic Disorder Labs: Lab Results  Component Value Date   HGBA1C 5.7 08/01/2014   No results found for: "PROLACTIN" Lab Results  Component Value Date   CHOL 214 (H) 11/19/2021   TRIG 234.0 (H) 11/19/2021   HDL 38.60 (L) 11/19/2021   CHOLHDL 6 11/19/2021   VLDL 46.8 (H) 11/19/2021   LDLCALC 99 10/24/2018   LDLCALC 110 (H) 10/19/2017   Lab Results  Component Value Date   TSH 1.73 05/21/2021   TSH 1.440 11/16/2020    Therapeutic Level Labs: No results found for: "LITHIUM" No results found for: "VALPROATE" No results found for: "CBMZ"  Current Medications: Current Outpatient Medications  Medication Sig Dispense Refill   acetaminophen (TYLENOL) 500 MG tablet Take 1,000 mg by mouth every 6 (six) hours as needed for moderate pain.      albuterol (PROVENTIL)  (2.5 MG/3ML) 0.083% nebulizer solution INHALE 3 ML BY NEBULIZATION EVERY 6 HOURS AS NEEDED FOR WHEEZING OR SHORTNESS OF BREATH 150 mL 0   albuterol (VENTOLIN HFA) 108 (90 Base) MCG/ACT inhaler TAKE 2 PUFFS BY MOUTH EVERY 6 HOURS AS NEEDED FOR WHEEZE OR SHORTNESS OF BREATH 8.5 each 6   amLODipine (NORVASC) 10 MG tablet Take 1 tablet (10 mg total) by mouth daily. 90 tablet 3   aspirin 325 MG tablet Take 325 mg by mouth daily.     benztropine (COGENTIN) 0.5 MG tablet Take 1 tablet (0.5 mg total) by mouth 2 (two) times daily. As needed for side effects of Prolixin 180 tablet 1   Blood Pressure Monitoring (BLOOD PRESSURE CUFF) MISC Use daily as directed to check blood pressure 1 each 0   celecoxib (CELEBREX) 50 MG capsule Take 1 capsule (50 mg total) by mouth 2 (two) times daily as needed for pain. 60 capsule 6   Cyanocobalamin (VITAMIN B-12 PO) Take 1 tablet by mouth daily.     cyclobenzaprine (FLEXERIL) 10 MG tablet TAKE 1 TABLET BY MOUTH EVERYDAY AT BEDTIME 30 tablet 3   diclofenac Sodium (VOLTAREN) 1 % GEL Apply 2 g topically 4 (four) times daily as needed (pain). 150 g 1   dicyclomine (BENTYL) 20 MG tablet Take 1 tablet (20 mg total) by mouth 2 (two) times daily as needed for spasms. 20 tablet 0   dicyclomine (BENTYL) 20 MG tablet Take 1 tablet (20 mg total) by mouth 2 (two) times daily. 20 tablet 0   esomeprazole (NEXIUM) 40 MG capsule TAKE 1 CAPSULE BY MOUTH EVERY DAY (Patient taking differently: Take 40 mg by mouth daily.) 90 capsule 3   fluPHENAZine (PROLIXIN) 10 MG tablet TAKE 1 TABLET BY MOUTH EVERY DAY 90 tablet 1   fluticasone (FLONASE) 50 MCG/ACT nasal spray SPRAY 2 SPRAYS INTO EACH NOSTRIL EVERY DAY 48 mL 2   gabapentin (NEURONTIN) 300 MG capsule Take 1 capsule (300 mg total) by mouth 2 (two) times daily. 180 capsule 3   KLOR-CON M20 20 MEQ tablet  TAKE 1 TABLET BY MOUTH EVERY DAY (Patient taking differently: Take 20 mEq by mouth daily.) 90 tablet 3   ondansetron (ZOFRAN) 4 MG tablet Take 1  tablet (4 mg total) by mouth every 6 (six) hours. 12 tablet 0   ondansetron (ZOFRAN-ODT) 4 MG disintegrating tablet Take 1 tablet (4 mg total) by mouth every 8 (eight) hours as needed. 20 tablet 0   rosuvastatin (CRESTOR) 10 MG tablet Take 1 tablet (10 mg total) by mouth daily. 90 tablet 3   sildenafil (VIAGRA) 100 MG tablet Take 100 mg by mouth daily as needed for erectile dysfunction.     SYMBICORT 160-4.5 MCG/ACT inhaler INHALE 2 PUFFS INTO THE LUNGS IN THE MORNING AND AT BEDTIME. TAKE 2 PUFFS BY MOUTH TWICE A DAY 30.6 each 1   tamsulosin (FLOMAX) 0.4 MG CAPS capsule Take 0.4 mg by mouth daily.     valsartan (DIOVAN) 160 MG tablet Take 1 tablet (160 mg total) by mouth daily. 90 tablet 1   VITAMIN D PO Take 1 tablet by mouth daily.     hydrOXYzine (ATARAX) 25 MG tablet Take 0.5-1 tablets (12.5-25 mg total) by mouth daily as needed for anxiety. Please limit use 90 tablet 0   No current facility-administered medications for this visit.     Musculoskeletal: Strength & Muscle Tone: within normal limits Gait & Station: normal Patient leans: N/A  Psychiatric Specialty Exam: Review of Systems  Musculoskeletal:        Left shoulder pain  Psychiatric/Behavioral:  Positive for sleep disturbance. The patient is nervous/anxious.   All other systems reviewed and are negative.   Blood pressure 116/76, pulse 96, temperature 98.5 F (36.9 C), temperature source Skin, height 5\' 9"  (1.753 m), weight 249 lb 6.4 oz (113.1 kg).Body mass index is 36.83 kg/m.  General Appearance: Casual  Eye Contact:  Fair  Speech:  Clear and Coherent  Volume:  Normal  Mood:  Anxious  Affect:  Appropriate  Thought Process:  Goal Directed and Descriptions of Associations: Intact  Orientation:  Full (Time, Place, and Person)  Thought Content: Logical   Suicidal Thoughts:  No  Homicidal Thoughts:  No  Memory:  Immediate;   Fair Recent;   Fair Remote;   Fair  Judgement:  Fair  Insight:  Fair  Psychomotor  Activity:  Normal  Concentration:  Concentration: Fair and Attention Span: Fair  Recall:  [employer] of Knowledge: Fair  Language: Fair  Akathisia:  No  Handed:  Right  AIMS (if indicated): done  Assets:  Communication Skills Desire for Improvement Housing Social Support  ADL's:  Intact  Cognition: WNL  Sleep:   restless due to need to urinate at night as well as shoulder pain   Screenings: AIMS    Loss adjuster, chartered Office Visit from 08/17/2022 in [facility] Office Visit from 04/06/2022 in [facility] Office Visit from 12/16/2021 in [facility] Office Visit from 09/15/2021 in [facility] Office Visit from 06/08/2021 in [facility]  AIMS Total Score 0 0 0 0 0      GAD-7    Flowsheet Row Office Visit from 12/16/2021 in [facility] Office Visit from 06/08/2021 in [facility]  Total GAD-7 Score 4 0      PHQ2-9    Flowsheet Row Office Visit from 04/06/2022 in [facility] Office Visit from 12/16/2021 in [facility] Office Visit from 09/15/2021 in [facility] Clinical Support from 08/04/2021 in [facility] Office Visit from 06/08/2021 in [facility]  PHQ-2 Total Score 0 0 0 0 0  PHQ-9 Total Score 0 -- -- -- 0      Flowsheet Row Office Visit from 08/17/2022 in [facility] ED from 08/08/2022 in [facility] at [facility] Office Visit from 04/06/2022 in [facility]  C-SSRS RISK CATEGORY No Risk No Risk No  Risk        Assessment and Plan: [name] is a 67 year old African-American male on disability, history of schizophrenia, hypertension, multiple medical problems was evaluated in office today.  Patient is currently struggling with shoulder pain although reports mood symptoms are stable.  Plan as noted below.  Plan Schizophrenia-stable Prolixin 10 mg p.o. daily Benztropine 0.5 mg p.o. twice daily as needed.  Advised to limit use.  Other specified anxiety disorder-generalized anxiety not occurring more days than not-stable Hydroxyzine 12.5-25 mg p.o. daily as needed for anxiety attacks Patient not interested in medication changes, not interested in medications for sleep at this time.  Will recommend managing his pain.  Tobacco use disorder-unstable Patient not willing to quit.  Patient encouraged to follow-up with provider for evaluation of his pain.   Consent: Patient/Guardian gives verbal consent for treatment and assignment of benefits for services provided during this visit. Patient/Guardian expressed understanding and agreed to proceed.   Follow-up in clinic in 3 to 4 months or sooner if needed.  This note was generated in part or whole with voice recognition software. Voice recognition is usually quite accurate but there are transcription errors that can and very often do occur. I apologize for any typographical errors that were not detected and corrected.    Jomarie Longs, MD 08/17/2022, 2:30 PM

## 2022-08-20 ENCOUNTER — Ambulatory Visit (INDEPENDENT_AMBULATORY_CARE_PROVIDER_SITE_OTHER): Payer: 59 | Admitting: Family

## 2022-08-20 ENCOUNTER — Encounter: Payer: Self-pay | Admitting: Family

## 2022-08-20 VITALS — BP 146/94 | HR 95 | Resp 19 | Ht 69.0 in | Wt 248.6 lb

## 2022-08-20 DIAGNOSIS — R413 Other amnesia: Secondary | ICD-10-CM

## 2022-08-20 DIAGNOSIS — M542 Cervicalgia: Secondary | ICD-10-CM | POA: Diagnosis not present

## 2022-08-20 DIAGNOSIS — R7989 Other specified abnormal findings of blood chemistry: Secondary | ICD-10-CM

## 2022-08-20 DIAGNOSIS — R7309 Other abnormal glucose: Secondary | ICD-10-CM | POA: Diagnosis not present

## 2022-08-20 DIAGNOSIS — R5383 Other fatigue: Secondary | ICD-10-CM | POA: Diagnosis not present

## 2022-08-20 DIAGNOSIS — G8929 Other chronic pain: Secondary | ICD-10-CM | POA: Diagnosis not present

## 2022-08-20 DIAGNOSIS — I77811 Abdominal aortic ectasia: Secondary | ICD-10-CM

## 2022-08-20 NOTE — Progress Notes (Signed)
[name] is a 67 y.o. male with the following history as recorded in [employer]:  Patient Active Problem List   Diagnosis Date Noted   Paresthesia 08/26/2021   Ulnar neuropathy at elbow, left 08/26/2021   Gait abnormality 07/14/2021   Chronic bilateral low back pain with bilateral sciatica 07/14/2021   Neck pain 07/14/2021   Left hand weakness 07/14/2021   Trigger finger, right middle finger 06/02/2021   Coronary artery calcification 08/15/2020   Nodule of apex of right lung 05/29/2020   Malignant neoplasm of prostate (HCC) 10/26/2019   Body mass index (BMI) 25.0-25.9, adult 10/25/2019   Spondylosis of cervical region without myelopathy or radiculopathy 09/26/2019   Numbness of hand 06/22/2019   Ulnar neuropathy at elbow of left upper extremity 06/05/2019   Chronic obstructive pulmonary disease (HCC) 10/24/2018   Cervical radiculopathy at C8 01/18/2018   Right lateral epicondylitis 12/21/2017   Pain in joint, shoulder region 04/23/2015   Periumbilical abdominal pain 02/13/2015   Elevated PSA 08/04/2014   Subacromial bursitis 04/11/2014   Neck pain on right side 03/05/2014   Bilateral shoulder pain 03/05/2014   Recurrent boils 03/05/2014   Foreign body in stomach 12/31/2013   Reflux esophagitis 12/31/2013   Weight loss 05/06/2012   Incisional hernia 11/23/2011   Cramp of limb 01/13/2011   Hyperthyroidism 08/11/2010   Sebaceous cyst 08/10/2010   HYPERTROPHY PROSTATE W/UR OBST & OTH LUTS 02/27/2010   Tobacco use disorder 08/05/2009   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      albuterol (PROVENTIL) (2.5 MG/3ML) 0.083% nebulizer solution INHALE 3 ML BY NEBULIZATION EVERY 6 HOURS AS NEEDED FOR WHEEZING OR SHORTNESS OF BREATH 150 mL 0   albuterol (VENTOLIN HFA) 108 (90 Base) MCG/ACT inhaler TAKE 2 PUFFS BY MOUTH EVERY 6 HOURS AS NEEDED FOR WHEEZE OR SHORTNESS OF BREATH 8.5 each 6   amLODipine (NORVASC) 10 MG tablet Take 1 tablet (10 mg total) by mouth daily. 90 tablet 3   aspirin 325 MG tablet Take 325 mg by mouth daily.     benztropine (COGENTIN) 0.5 MG tablet Take 1 tablet (0.5 mg total) by mouth 2 (two) times daily. As needed for side effects of Prolixin 180 tablet 1   Blood Pressure Monitoring (BLOOD PRESSURE CUFF) MISC Use daily as directed to check blood pressure 1 each 0   Cyanocobalamin (VITAMIN B-12 PO) Take 1 tablet by mouth daily.     cyclobenzaprine (FLEXERIL) 10 MG tablet TAKE 1 TABLET BY MOUTH EVERYDAY AT BEDTIME 30 tablet 3   diclofenac Sodium (VOLTAREN) 1 % GEL Apply 2 g topically 4 (four) times daily as needed (pain). 150 g 1   dicyclomine (BENTYL) 20 MG tablet Take 1 tablet (20 mg total) by mouth 2 (two) times daily as needed for spasms. 20 tablet 0   dicyclomine (BENTYL) 20 MG tablet Take 1 tablet (20 mg total) by mouth 2 (two) times daily. 20 tablet 0   esomeprazole (NEXIUM) 40 MG  capsule TAKE 1 CAPSULE BY MOUTH EVERY DAY (Patient taking differently: Take 40 mg by mouth daily.) 90 capsule 3   fluPHENAZine (PROLIXIN) 10 MG tablet TAKE 1 TABLET BY MOUTH EVERY DAY 90 tablet 1   fluticasone (FLONASE) 50 MCG/ACT nasal spray SPRAY 2 SPRAYS INTO EACH NOSTRIL EVERY DAY 48 mL 2   gabapentin (NEURONTIN) 300 MG capsule Take 1 capsule (300 mg total) by mouth 2 (two) times daily. 180 capsule 3   hydrOXYzine (ATARAX) 25 MG tablet Take 0.5-1 tablets (12.5-25 mg total) by mouth daily as needed for  anxiety. Please limit use 90 tablet 0   KLOR-CON M20 20 MEQ tablet TAKE 1 TABLET BY MOUTH EVERY DAY (Patient taking differently: Take 20 mEq by mouth daily.) 90 tablet 3   ondansetron (ZOFRAN) 4 MG tablet Take 1 tablet (4 mg total) by mouth every 6 (six) hours. 12 tablet 0   ondansetron (ZOFRAN-ODT) 4 MG disintegrating tablet Take 1 tablet (4 mg total) by mouth every 8 (eight) hours as needed. 20 tablet 0   rosuvastatin (CRESTOR) 10 MG tablet Take 1 tablet (10 mg total) by mouth daily. 90 tablet 3   sildenafil (VIAGRA) 100 MG tablet Take 100 mg by mouth daily as needed for erectile dysfunction.     SYMBICORT 160-4.5 MCG/ACT inhaler INHALE 2 PUFFS INTO THE LUNGS IN THE MORNING AND AT BEDTIME. TAKE 2 PUFFS BY MOUTH TWICE A DAY 30.6 each 1   tamsulosin (FLOMAX) 0.4 MG CAPS capsule Take 0.4 mg by mouth daily.     valsartan (DIOVAN) 160 MG tablet Take 1 tablet (160 mg total) by mouth daily. 90 tablet 1   VITAMIN D PO Take 1 tablet by mouth daily.     celecoxib (CELEBREX) 50 MG capsule Take 1 capsule (50 mg total) by mouth 2 (two) times daily as needed for pain. (Patient not taking: Reported on 08/20/2022) 60 capsule 6   No current facility-administered medications for this visit.    Allergies: Celecoxib, Iohexol, Ivp dye [iodinated contrast media], Metoclopramide hcl, Ritalin [methylphenidate], and Wellbutrin [bupropion]  Past Medical History:  Diagnosis Date   ALLERGIC RHINITIS 01/25/2007   ANXIETY DISORDER, GENERALIZED 01/25/2007   Arthritis    Chronic kidney disease    stage 3   COPD (chronic obstructive pulmonary disease) (HCC)    DEPRESSION 01/25/2007   ESOPHAGITIS 12/28/2007   Fatty liver 10/09/2010   GERD 01/25/2007   GLUCOSE INTOLERANCE, HX OF 01/25/2007   Heart murmur    hx of    HEMORRHOIDS, INTERNAL, WITH BLEEDING 04/26/2008   Hiatal hernia    HYPERLIPIDEMIA 12/27/2007   HYPERTENSION, ESSENTIAL NOS 01/25/2007   Hyperthyroidism 08/11/2010   HYPERTROPHY PROSTATE W/UR OBST & OTH  LUTS 02/27/2010   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 Abbey Chatters   COLONOSCOPY  04/23/2010   normal   COLONOSCOPY WITH PROPOFOL  01/22/2021   poor prep[   ESOPHAGOGASTRODUODENOSCOPY N/A 12/31/2013   Procedure: ESOPHAGOGASTRODUODENOSCOPY (EGD);  Surgeon: Iva Boop, MD;  Location: [facility];  Service: Endoscopy;  Laterality: N/A;   Excision of scalp lesion  07/18/2009  Excision of thyroid mass     GANGLION CYST EXCISION     right foot x 2   HERNIA REPAIR     ventral   INCISIONAL HERNIA REPAIR  12/01/2011   Procedure: LAPAROSCOPIC INCISIONAL HERNIA;  Surgeon: Shelly Rubenstein, MD;  Location: [facility];  Service: General;  Laterality: N/A;   SHOULDER SURGERY     right   UPPER GASTROINTESTINAL ENDOSCOPY      Family History  Problem Relation Age of Onset   Hypertension Mother    Bone cancer Mother    Hypertension Father    Kidney disease Father    Prostate cancer Brother    Bone cancer Brother    Kidney disease Brother    Mental illness Maternal Grandmother    Mental illness Son    Prostate cancer Other    Colon cancer Neg Hx    Colon polyps Neg Hx    Esophageal cancer Neg Hx    Rectal cancer Neg Hx    Stomach cancer Neg Hx     Social History   Tobacco Use   Smoking status: Every Day    Packs/day: 1.50    Years: 49.00    Additional pack years: 0.00    Total pack years: 73.50    Types: Cigarettes    Start date: 1971    Passive exposure: Never   Smokeless tobacco: Never   Tobacco comments:    Started smoking at age 25,still smoking pack - 1-1/2 packs  per day  01/08/2021  Substance Use Topics   Alcohol use: No    Subjective:  Accompanied by wife; concerned about chronic neck pain radiating into upper arms/ shoulder; has seen ortho in the past and was getting injections; Also concerned about worsening memory changes- has been taking OTC B12 as discussed last year;  Would like to review recent CT results from last ER visit;    Objective:  Vitals:   08/20/22 1444  BP: (!) 146/94  Pulse: 95  Resp: 19  SpO2: 96%  Weight: 248 lb 9.6 oz (112.8 kg)  Height: 5\' 9"  (1.753 m)    General: Well developed, well nourished, in no acute distress  Skin : Warm and dry.  Head: Normocephalic and atraumatic  Lungs: Respirations unlabored; clear to auscultation bilaterally without wheeze, rales, rhonchi  CVS exam: normal rate and regular rhythm.  Musculoskeletal: No deformities; no active joint inflammation  Extremities: No edema, cyanosis, clubbing  Vessels: Symmetric bilaterally  Neurologic: Alert and oriented; speech intact; face symmetrical; moves all extremities well; CNII-XII intact without focal deficit   Assessment:  1. Chronic neck pain   2. Low vitamin B12 level   3. Other fatigue   4. Elevated glucose   5. Memory changes   6. Abdominal aortic ectasia (HCC)     Plan:  Update cervical MRI; will plan to refer back to neurosurgery for pain management options;  Check B12 level; Check TSH today; Check Hgba1c; Refer to neurology per patient request;  No aneurysm noted; will update ultrasound  No follow-ups on file.  Orders Placed This Encounter  Procedures   MR Cervical Spine Wo Contrast    Standing Status:   Future    Standing Expiration Date:   08/20/2023    Order Specific Question:   What is the patient's sedation requirement?    Answer:   No Sedation    Order Specific Question:   Does the patient have a pacemaker or implanted devices?    Answer:  No    Order Specific Question:   Preferred imaging location?    Answer:   [facility] (table limit-550lbs)   B12   Hemoglobin A1c   TSH   Ambulatory referral to Neurology    Referral Priority:   Routine    Referral Type:   Consultation    Referral Reason:   Specialty Services Required    Requested Specialty:   Neurology    Number of Visits Requested:   1    Requested Prescriptions    No prescriptions requested or ordered in this encounter

## 2022-08-21 ENCOUNTER — Other Ambulatory Visit: Payer: Self-pay | Admitting: Family

## 2022-08-21 LAB — HEMOGLOBIN A1C
Hgb A1c MFr Bld: 5.9 % of total Hgb — ABNORMAL HIGH (ref <5.7)
Mean Plasma Glucose: 123 mg/dL
eAG (mmol/L): 6.8 mmol/L

## 2022-08-21 LAB — TSH: TSH: 1.98 mIU/L (ref 0.40–4.50)

## 2022-08-21 LAB — VITAMIN B12: Vitamin B-12: 510 pg/mL (ref 200–1100)

## 2022-08-23 ENCOUNTER — Other Ambulatory Visit: Payer: Self-pay | Admitting: Family

## 2022-08-23 MED ORDER — VALSARTAN 320 MG PO TABS: 320.0000 mg | ORAL_TABLET | Freq: Every day | ORAL | 1 refills | Status: DC

## 2022-09-08 ENCOUNTER — Encounter (HOSPITAL_BASED_OUTPATIENT_CLINIC_OR_DEPARTMENT_OTHER): Payer: Self-pay

## 2022-09-08 ENCOUNTER — Emergency Department (HOSPITAL_BASED_OUTPATIENT_CLINIC_OR_DEPARTMENT_OTHER)
Admission: EM | Admit: 2022-09-08 | Discharge: 2022-09-08 | Disposition: A | Discharge status: Home / Self Care | Payer: 59 | Attending: Emergency Medicine | Admitting: Emergency Medicine

## 2022-09-08 ENCOUNTER — Emergency Department (HOSPITAL_BASED_OUTPATIENT_CLINIC_OR_DEPARTMENT_OTHER): Payer: 59

## 2022-09-08 ENCOUNTER — Other Ambulatory Visit: Payer: Self-pay

## 2022-09-08 DIAGNOSIS — Z79899 Other long term (current) drug therapy: Secondary | ICD-10-CM | POA: Diagnosis not present

## 2022-09-08 DIAGNOSIS — M542 Cervicalgia: Secondary | ICD-10-CM | POA: Diagnosis not present

## 2022-09-08 DIAGNOSIS — J449 Chronic obstructive pulmonary disease, unspecified: Secondary | ICD-10-CM | POA: Insufficient documentation

## 2022-09-08 DIAGNOSIS — R079 Chest pain, unspecified: Secondary | ICD-10-CM | POA: Diagnosis not present

## 2022-09-08 DIAGNOSIS — R0789 Other chest pain: Secondary | ICD-10-CM | POA: Insufficient documentation

## 2022-09-08 DIAGNOSIS — I10 Essential (primary) hypertension: Secondary | ICD-10-CM | POA: Diagnosis not present

## 2022-09-08 DIAGNOSIS — M79641 Pain in right hand: Secondary | ICD-10-CM | POA: Insufficient documentation

## 2022-09-08 LAB — COMPREHENSIVE METABOLIC PANEL
ALT: 16 U/L (ref 0–44)
AST: 20 U/L (ref 15–41)
Albumin: 4 g/dL (ref 3.5–5.0)
Alkaline Phosphatase: 66 U/L (ref 38–126)
Anion gap: 10 (ref 5–15)
BUN: 6 mg/dL — ABNORMAL LOW (ref 8–23)
CO2: 27 mmol/L (ref 22–32)
Calcium: 9.3 mg/dL (ref 8.9–10.3)
Chloride: 100 mmol/L (ref 98–111)
Creatinine, Ser: 1.37 mg/dL — ABNORMAL HIGH (ref 0.61–1.24)
GFR, Estimated: 57 mL/min — ABNORMAL LOW (ref >60)
Glucose, Bld: 104 mg/dL — ABNORMAL HIGH (ref 70–99)
Potassium: 3.7 mmol/L (ref 3.5–5.1)
Sodium: 137 mmol/L (ref 135–145)
Total Bilirubin: 0.5 mg/dL (ref 0.3–1.2)
Total Protein: 7.1 g/dL (ref 6.5–8.1)

## 2022-09-08 LAB — CBC
HCT: 43 % (ref 39.0–52.0)
Hemoglobin: 14.3 g/dL (ref 13.0–17.0)
MCH: 28.4 pg (ref 26.0–34.0)
MCHC: 33.3 g/dL (ref 30.0–36.0)
MCV: 85.3 fL (ref 80.0–100.0)
Platelets: 305 10*3/uL (ref 150–400)
RBC: 5.04 MIL/uL (ref 4.22–5.81)
RDW: 14.6 % (ref 11.5–15.5)
WBC: 8.4 10*3/uL (ref 4.0–10.5)
nRBC: 0 % (ref 0.0–0.2)

## 2022-09-08 LAB — TROPONIN I (HIGH SENSITIVITY): Troponin I (High Sensitivity): 4 ng/L (ref <18)

## 2022-09-08 MED ORDER — OXYCODONE HCL 5 MG PO TABS
5.0000 mg | ORAL_TABLET | Freq: Once | ORAL | Status: AC
  Administered 2022-09-08: 5 mg via ORAL
  Filled 2022-09-08: qty 1

## 2022-09-08 MED ORDER — DICLOFENAC SODIUM 1 % EX GEL
2.0000 g | Freq: Once | CUTANEOUS | Status: AC
  Administered 2022-09-08: 2 g via TOPICAL
  Filled 2022-09-08: qty 100

## 2022-09-08 NOTE — Discharge Instructions (Signed)
You can apply the diclofenac gel to your hand up to four times daily

## 2022-09-08 NOTE — ED Triage Notes (Addendum)
Pt reports LT sided neck pain that radiates down to LT elbow. States he has bad discs (4-7) and takes muscle relaxers and Tylenol with no relief. Reports just seen recently by PCP for same and he has MRI scheduled. Pt also endorses CP, no N/V/D. PT also reports that he thinks he got bit by something on his RT hand.

## 2022-09-08 NOTE — ED Provider Notes (Signed)
EMERGENCY DEPARTMENT AT [facility] Provider Note   CSN: [id number] Arrival date & time: 09/08/22  0224     History  Chief Complaint  Patient presents with   Neck Pain   Hand Pain   Chest Pain    [name] is a 67 y.o. male.  The history is provided by the patient.  Neck Pain Associated symptoms: chest pain   Hand Pain Associated symptoms include chest pain.  Chest Pain [name] is a 67 y.o. male who presents to the Emergency Department complaining of hand pain.  He presents to the emergency department complaining of burning pain to the right hypothenar eminence that started 2 days ago.  He states that he thinks he got stung or bit by something.  Pain is overall worsening and he has swelling to that area.  There is no dorsal pain.  He also on review of systems reports several days of left-sided chest/axillary pain that is what he calls related to his chronic pinched nerve in his neck that is getting followed up as an outpatient.  He has chronic pain in his left neck and left upper extremity and is followed by PCP and neurosurgery with plan for MRI in June.  No fevers, difficulty breathing, nausea, vomiting.  He has a history of COPD, schizophrenia, hyperlipidemia hypertension.     Home Medications Prior to Admission medications   Medication Sig Start Date End Date Taking? Authorizing Provider  acetaminophen (TYLENOL) 500 MG tablet Take 1,000 mg by mouth every 6 (six) hours as needed for moderate pain.     [provider]  albuterol (PROVENTIL) (2.5 MG/3ML) 0.083% nebulizer solution INHALE 3 ML BY NEBULIZATION EVERY 6 HOURS AS NEEDED FOR WHEEZING OR SHORTNESS OF BREATH 07/27/22   Olive Bass, FNP  albuterol (VENTOLIN HFA) 108 (90 Base) MCG/ACT inhaler TAKE 2 PUFFS BY MOUTH EVERY 6 HOURS AS NEEDED FOR WHEEZE OR SHORTNESS OF BREATH 07/14/22   Luciano Cutter, MD  amLODipine (NORVASC) 10 MG tablet Take 1 tablet (10 mg total) by  mouth daily. 11/20/21   Olive Bass, FNP  aspirin 325 MG tablet Take 325 mg by mouth daily.    [provider]  benztropine (COGENTIN) 0.5 MG tablet Take 1 tablet (0.5 mg total) by mouth 2 (two) times daily. As needed for side effects of Prolixin 12/16/21   Jomarie Longs, MD  Blood Pressure Monitoring (BLOOD PRESSURE CUFF) MISC Use daily as directed to check blood pressure 06/01/18   Olive Bass, FNP  celecoxib (CELEBREX) 50 MG capsule Take 1 capsule (50 mg total) by mouth 2 (two) times daily as needed for pain. Patient not taking: Reported on 08/20/2022 08/26/21   Levert Feinstein, MD  Cyanocobalamin (VITAMIN B-12 PO) Take 1 tablet by mouth daily.    [provider]  cyclobenzaprine (FLEXERIL) 10 MG tablet TAKE 1 TABLET BY MOUTH EVERYDAY AT BEDTIME 07/15/22   Worthy Rancher B, FNP  diclofenac Sodium (VOLTAREN) 1 % GEL Apply 2 g topically 4 (four) times daily as needed (pain). 08/22/20   Olive Bass, FNP  dicyclomine (BENTYL) 20 MG tablet Take 1 tablet (20 mg total) by mouth 2 (two) times daily as needed for spasms. 11/04/21   Carroll Sage, PA-C  dicyclomine (BENTYL) 20 MG tablet Take 1 tablet (20 mg total) by mouth 2 (two) times daily. 08/08/22   Mardene Sayer, MD  esomeprazole (NEXIUM) 40 MG capsule TAKE 1 CAPSULE BY MOUTH EVERY DAY Patient  taking differently: Take 40 mg by mouth daily. 09/08/21   Olive Bass, FNP  fluPHENAZine (PROLIXIN) 10 MG tablet TAKE 1 TABLET BY MOUTH EVERY DAY 05/17/22   Jomarie Longs, MD  fluticasone (FLONASE) 50 MCG/ACT nasal spray SPRAY 2 SPRAYS INTO EACH NOSTRIL EVERY DAY 02/16/22   Olive Bass, FNP  gabapentin (NEURONTIN) 300 MG capsule Take 1 capsule (300 mg total) by mouth 2 (two) times daily. 11/20/21   Olive Bass, FNP  hydrOXYzine (ATARAX) 25 MG tablet Take 0.5-1 tablets (12.5-25 mg total) by mouth daily as needed for anxiety. Please limit use 08/17/22 11/15/22  Eappen, Levin Bacon, MD   KLOR-CON M20 20 MEQ tablet TAKE 1 TABLET BY MOUTH EVERY DAY Patient taking differently: Take 20 mEq by mouth daily. 09/24/21   Olive Bass, FNP  ondansetron (ZOFRAN) 4 MG tablet Take 1 tablet (4 mg total) by mouth every 6 (six) hours. 11/04/21   Carroll Sage, PA-C  ondansetron (ZOFRAN-ODT) 4 MG disintegrating tablet Take 1 tablet (4 mg total) by mouth every 8 (eight) hours as needed. 08/08/22   Mardene Sayer, MD  rosuvastatin (CRESTOR) 10 MG tablet Take 1 tablet (10 mg total) by mouth daily. 11/20/21   Olive Bass, FNP  sildenafil (VIAGRA) 100 MG tablet Take 100 mg by mouth daily as needed for erectile dysfunction. 10/28/20   [provider]  SYMBICORT 160-4.5 MCG/ACT inhaler INHALE 2 PUFFS INTO THE LUNGS IN THE MORNING AND AT BEDTIME. TAKE 2 PUFFS BY MOUTH TWICE A DAY 02/16/22   Olive Bass, FNP  tamsulosin (FLOMAX) 0.4 MG CAPS capsule Take 0.4 mg by mouth daily. 09/29/18   [provider]  valsartan (DIOVAN) 320 MG tablet Take 1 tablet (320 mg total) by mouth daily. 08/23/22   Olive Bass, FNP  VITAMIN D PO Take 1 tablet by mouth daily.    [provider]      Allergies    Celecoxib, Iohexol, Ivp dye [iodinated contrast media], Metoclopramide hcl, Ritalin [methylphenidate], and Wellbutrin [bupropion]    Review of Systems   Review of Systems  Cardiovascular:  Positive for chest pain.  Musculoskeletal:  Positive for neck pain.  All other systems reviewed and are negative.   Physical Exam Updated Vital Signs BP 123/76   Pulse 90   Temp 98 F (36.7 C) (Oral)   Resp 16   Ht 5\' 9"  (1.753 m)   Wt 117 kg   SpO2 99%   BMI 38.10 kg/m  Physical Exam Vitals and nursing note reviewed.  Constitutional:      Appearance: He is well-developed.  HENT:     Head: Normocephalic and atraumatic.  Cardiovascular:     Rate and Rhythm: Normal rate and regular rhythm.     Heart sounds: No murmur heard. Pulmonary:      Effort: Pulmonary effort is normal. No respiratory distress.     Breath sounds: Normal breath sounds.  Abdominal:     Palpations: Abdomen is soft.     Tenderness: There is no abdominal tenderness. There is no guarding or rebound.     Comments: Midline abdominal hernia that is soft and nontender.  Musculoskeletal:        General: No tenderness.     Comments: Soft tissue swelling throughout the right hand, greatest over the palmar ulnar aspect without any overlying erythema.  Range of motion is intact throughout the wrist, digits.  2+ radial and DP pulses bilaterally.  Skin:    General: Skin  is warm and dry.  Neurological:     Mental Status: He is alert and oriented to person, place, and time.     Comments: 5 out of 5 grip strength in the right upper extremity with sensation to light touch intact throughout the right upper extremity  Psychiatric:        Behavior: Behavior normal.     ED Results / Procedures / Treatments   Labs (all labs ordered are listed, but only abnormal results are displayed) Labs Reviewed  COMPREHENSIVE METABOLIC PANEL - Abnormal; Notable for the following components:      Result Value   Glucose, Bld 104 (*)    BUN 6 (*)    Creatinine, Ser 1.37 (*)    GFR, Estimated 57 (*)    All other components within normal limits  CBC  TROPONIN I (HIGH SENSITIVITY)    EKG EKG Interpretation  Date/Time:  Wednesday Sep 08 2022 02:42:54 EDT Ventricular Rate:  93 PR Interval:  166 QRS Duration: 84 QT Interval:  353 QTC Calculation: 439 R Axis:   2 Text Interpretation: Sinus rhythm Confirmed by Tilden Fossa [phone]) on 09/08/2022 3:35:03 AM  Radiology DG Chest 2 View  Result Date: 09/08/2022 CLINICAL DATA:  Chest pain, left-sided neck pain. EXAM: CHEST - 2 VIEW COMPARISON:  07/21/2020. FINDINGS: The heart size and mediastinal contours are within normal limits. There is atherosclerotic calcification of the aorta. No consolidation, effusion, or pneumothorax. No acute  osseous abnormality. Surgical clips are present in the superior mediastinum. IMPRESSION: No active cardiopulmonary disease. Electronically Signed   By: Thornell Sartorius M.D.   On: 09/08/2022 03:12    Procedures Procedures    Medications Ordered in ED Medications  diclofenac Sodium (VOLTAREN) 1 % topical gel 2 g (2 g Topical Given 09/08/22 0342)  oxyCODONE (Oxy IR/ROXICODONE) immediate release tablet 5 mg (5 mg Oral Given 09/08/22 0410)    ED Course/ Medical Decision Making/ A&P                             Medical Decision Making Amount and/or Complexity of Data Reviewed Labs: ordered. Radiology: ordered.  Risk Prescription drug management.   Patient with history of COPD, prostate cancer, cervical disc disease, schizophrenia here for evaluation of right hand burning and swelling, he thinks he may have been bit by something but cannot confirm this.  On examination he does have soft tissue swelling throughout the hand but no erythema or evidence of soft tissue infection.  Range of motion is intact throughout the digits with 5 out of 5 strength in the right upper extremity.  Current clinical picture is not consistent with DVT, radiculopathy, infection.  Patient does of note today he has left upper chest/axillary pain that is in keeping with the pain from his left neck and shoulder.  There is no evidence of zoster in this area.  Symptoms have been ongoing for several days.  Current clinical picture is not consistent with ACS, PE, dissection.  Will treat his hand pain with Voltaren gel, elevation with outpatient follow-up and return precautions.       Final Clinical Impression(s) / ED Diagnoses Final diagnoses:  Right hand pain  Atypical chest pain    Rx / DC Orders ED Discharge Orders     None         Tilden Fossa, MD 09/08/22 [phone]

## 2022-09-12 ENCOUNTER — Other Ambulatory Visit: Payer: Self-pay | Admitting: Family

## 2022-09-21 ENCOUNTER — Other Ambulatory Visit: Payer: Self-pay | Admitting: Family

## 2022-10-05 ENCOUNTER — Ambulatory Visit
Admission: RE | Admit: 2022-10-05 | Discharge: 2022-10-05 | Disposition: A | Discharge status: Home / Self Care | Payer: 59 | Source: Ambulatory Visit | Attending: Family | Admitting: Family

## 2022-10-05 DIAGNOSIS — M47812 Spondylosis without myelopathy or radiculopathy, cervical region: Secondary | ICD-10-CM | POA: Diagnosis not present

## 2022-10-05 DIAGNOSIS — G8929 Other chronic pain: Secondary | ICD-10-CM

## 2022-10-05 DIAGNOSIS — M50221 Other cervical disc displacement at C4-C5 level: Secondary | ICD-10-CM | POA: Diagnosis not present

## 2022-10-07 ENCOUNTER — Encounter: Payer: Self-pay | Admitting: Family

## 2022-10-07 ENCOUNTER — Ambulatory Visit (INDEPENDENT_AMBULATORY_CARE_PROVIDER_SITE_OTHER): Payer: 59 | Admitting: Family

## 2022-10-07 VITALS — BP 114/71 | HR 92 | Temp 97.9°F | Ht 69.0 in | Wt 249.4 lb

## 2022-10-07 DIAGNOSIS — G5601 Carpal tunnel syndrome, right upper limb: Secondary | ICD-10-CM

## 2022-10-07 MED ORDER — PREDNISONE 20 MG PO TABS: 20.0000 mg | ORAL_TABLET | Freq: Every day | ORAL | 0 refills | Status: DC

## 2022-10-07 NOTE — Progress Notes (Signed)
[name] is a 67 y.o. male with the following history as recorded in [employer]:  Patient Active Problem List   Diagnosis Date Noted   Paresthesia 08/26/2021   Ulnar neuropathy at elbow, left 08/26/2021   Gait abnormality 07/14/2021   Chronic bilateral low back pain with bilateral sciatica 07/14/2021   Neck pain 07/14/2021   Left hand weakness 07/14/2021   Trigger finger, right middle finger 06/02/2021   Coronary artery calcification 08/15/2020   Nodule of apex of right lung 05/29/2020   Malignant neoplasm of prostate (HCC) 10/26/2019   Body mass index (BMI) 25.0-25.9, adult 10/25/2019   Spondylosis of cervical region without myelopathy or radiculopathy 09/26/2019   Numbness of hand 06/22/2019   Ulnar neuropathy at elbow of left upper extremity 06/05/2019   Chronic obstructive pulmonary disease (HCC) 10/24/2018   Cervical radiculopathy at C8 01/18/2018   Right lateral epicondylitis 12/21/2017   Pain in joint, shoulder region 04/23/2015   Periumbilical abdominal pain 02/13/2015   Elevated PSA 08/04/2014   Subacromial bursitis 04/11/2014   Neck pain on right side 03/05/2014   Bilateral shoulder pain 03/05/2014   Recurrent boils 03/05/2014   Foreign body in stomach 12/31/2013   Reflux esophagitis 12/31/2013   Weight loss 05/06/2012   Incisional hernia 11/23/2011   Cramp of limb 01/13/2011   Hyperthyroidism 08/11/2010   Sebaceous cyst 08/10/2010   HYPERTROPHY PROSTATE W/UR OBST & OTH LUTS 02/27/2010   Tobacco use disorder 08/05/2009   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      albuterol (PROVENTIL) (2.5 MG/3ML) 0.083% nebulizer solution INHALE 3 ML BY NEBULIZATION EVERY 6 HOURS AS NEEDED FOR WHEEZING OR SHORTNESS OF BREATH 150 mL 0   albuterol (VENTOLIN HFA) 108 (90 Base) MCG/ACT inhaler TAKE 2 PUFFS BY MOUTH EVERY 6 HOURS AS NEEDED FOR WHEEZE OR SHORTNESS OF BREATH 8.5 each 6   amLODipine (NORVASC) 10 MG tablet Take 1 tablet (10 mg total) by mouth daily. 90 tablet 3   aspirin 325 MG tablet Take 325 mg by mouth daily.     benztropine (COGENTIN) 0.5 MG tablet Take 1 tablet (0.5 mg total) by mouth 2 (two) times daily. As needed for side effects of Prolixin 180 tablet 1   Blood Pressure Monitoring (BLOOD PRESSURE CUFF) MISC Use daily as directed to check blood pressure 1 each 0   Cyanocobalamin (VITAMIN B-12 PO) Take 1 tablet by mouth daily.     cyclobenzaprine (FLEXERIL) 10 MG tablet TAKE 1 TABLET BY MOUTH EVERYDAY AT BEDTIME 30 tablet 3   diclofenac Sodium (VOLTAREN) 1 % GEL Apply 2 g topically 4 (four) times daily as needed (pain). 150 g 1   dicyclomine (BENTYL) 20 MG tablet Take 1 tablet (20 mg total) by mouth 2 (two) times daily as needed for spasms. 20 tablet 0   dicyclomine (BENTYL) 20 MG tablet Take 1 tablet (20 mg total) by mouth 2 (two) times daily. 20 tablet 0   esomeprazole (NEXIUM) 40 MG  capsule TAKE 1 CAPSULE BY MOUTH EVERY DAY (Patient taking differently: Take 40 mg by mouth daily.) 90 capsule 3   fluPHENAZine (PROLIXIN) 10 MG tablet TAKE 1 TABLET BY MOUTH EVERY DAY 90 tablet 1   fluticasone (FLONASE) 50 MCG/ACT nasal spray Place 2 sprays into both nostrils daily. 48 mL 0   gabapentin (NEURONTIN) 300 MG capsule Take 1 capsule (300 mg total) by mouth 2 (two) times daily. 180 capsule 3   hydrOXYzine (ATARAX) 25 MG tablet Take 0.5-1 tablets (12.5-25 mg total) by mouth daily as needed for  anxiety. Please limit use 90 tablet 0   KLOR-CON M20 20 MEQ tablet TAKE 1 TABLET BY MOUTH EVERY DAY (Patient taking differently: Take 20 mEq by mouth daily.) 90 tablet 3   ondansetron (ZOFRAN) 4 MG tablet Take 1 tablet (4 mg total) by mouth every 6 (six) hours. 12 tablet 0   ondansetron (ZOFRAN-ODT) 4 MG disintegrating tablet Take 1 tablet (4 mg total) by mouth every 8 (eight) hours as needed. 20 tablet 0   rosuvastatin (CRESTOR) 10 MG tablet Take 1 tablet (10 mg total) by mouth daily. 90 tablet 3   sildenafil (VIAGRA) 100 MG tablet Take 100 mg by mouth daily as needed for erectile dysfunction.     SYMBICORT 160-4.5 MCG/ACT inhaler INHALE 2 PUFFS INTO THE LUNGS IN THE MORNING AND 2 PUFFS AT BEDTIME 30.6 each 1   tamsulosin (FLOMAX) 0.4 MG CAPS capsule Take 0.4 mg by mouth daily.     valsartan (DIOVAN) 320 MG tablet Take 1 tablet (320 mg total) by mouth daily. 90 tablet 1   VITAMIN D PO Take 1 tablet by mouth daily.     celecoxib (CELEBREX) 50 MG capsule Take 1 capsule (50 mg total) by mouth 2 (two) times daily as needed for pain. (Patient not taking: Reported on 08/20/2022) 60 capsule 6   No current facility-administered medications for this visit.    Allergies: Celecoxib, Iohexol, Ivp dye [iodinated contrast media], Metoclopramide hcl, Ritalin [methylphenidate], and Wellbutrin [bupropion]  Past Medical History:  Diagnosis Date   ALLERGIC RHINITIS 01/25/2007   ANXIETY DISORDER, GENERALIZED 01/25/2007   Arthritis    Chronic kidney disease    stage 3   COPD (chronic obstructive pulmonary disease) (HCC)    DEPRESSION 01/25/2007   ESOPHAGITIS 12/28/2007   Fatty liver 10/09/2010   GERD 01/25/2007   GLUCOSE INTOLERANCE, HX OF 01/25/2007   Heart murmur    hx of    HEMORRHOIDS, INTERNAL, WITH BLEEDING 04/26/2008   Hiatal hernia    HYPERLIPIDEMIA 12/27/2007   HYPERTENSION, ESSENTIAL NOS 01/25/2007   Hyperthyroidism 08/11/2010   HYPERTROPHY PROSTATE W/UR OBST & OTH LUTS 02/27/2010   Impotence  of organic origin 08/05/2009   LOW BACK PAIN, CHRONIC 11/08/2007   Pancreatitis    Prostate cancer (HCC)    Rectal abscess    SCHIZOPHRENIA NEC, 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 Abbey Chatters   COLONOSCOPY  04/23/2010   normal   COLONOSCOPY WITH PROPOFOL  01/22/2021   poor prep[   ESOPHAGOGASTRODUODENOSCOPY N/A 12/31/2013   Procedure: ESOPHAGOGASTRODUODENOSCOPY (EGD);  Surgeon: Iva Boop, MD;  Location: [facility];  Service: Endoscopy;  Laterality: N/A;   Excision of scalp lesion  07/18/2009   Excision of thyroid mass  GANGLION CYST EXCISION     right foot x 2   HERNIA REPAIR     ventral   INCISIONAL HERNIA REPAIR  12/01/2011   Procedure: LAPAROSCOPIC INCISIONAL HERNIA;  Surgeon: Shelly Rubenstein, MD;  Location: [facility] OR;  Service: General;  Laterality: N/A;   SHOULDER SURGERY     right   UPPER GASTROINTESTINAL ENDOSCOPY      Family History  Problem Relation Age of Onset   Hypertension Mother    Bone cancer Mother    Hypertension Father    Kidney disease Father    Prostate cancer Brother    Bone cancer Brother    Kidney disease Brother    Mental illness Maternal Grandmother    Mental illness Son    Prostate cancer Other    Colon cancer Neg Hx    Colon polyps Neg Hx    Esophageal cancer Neg Hx    Rectal cancer Neg Hx    Stomach cancer Neg Hx     Social History   Tobacco Use   Smoking status: Every Day    Packs/day: 1.50    Years: 49.00    Additional pack years: 0.00    Total pack years: 73.50    Types: Cigarettes    Start date: 39    Passive exposure: Never   Smokeless tobacco: Never   Tobacco comments:    Started smoking at age 55,still smoking pack - 1-1/2 packs  per day 01/08/2021  Substance Use Topics    Alcohol use: No    Subjective:   Chronic burning of right hand; "feels like it is on fire"; was seen at ER almost a month ago and given Rx for Voltaren gel; notes that he was told he had carpal tunnel in the past but was not able to follow up for surgery;    Objective:  Vitals:   10/07/22 1035  BP: 114/71  Pulse: 92  Temp: 97.9 F (36.6 C)  TempSrc: Oral  SpO2: 96%  Weight: 249 lb 6.4 oz (113.1 kg)  Height: 5\' 9"  (1.753 m)    General: Well developed, well nourished, in no acute distress  Skin : Warm and dry.  Head: Normocephalic and atraumatic  Lungs: Respirations unlabored;  Musculoskeletal: No deformities; no active joint inflammation  Extremities: No edema, cyanosis, clubbing  Vessels: Symmetric bilaterally  Neurologic: Alert and oriented; speech intact; face symmetrical; moves all extremities well; CNII-XII intact without focal deficit   Assessment:  1. Carpal tunnel syndrome of right wrist     Plan:  Trial of Prednisone 20 mg every day x 5 days; (cannot due NSAIDs due to kidney disease); refer to orthopedics to discuss further treatment options;   No follow-ups on file.  Orders Placed This Encounter  Procedures   Ambulatory referral to Orthopedics    Referral Priority:   Routine    Referral Type:   Consultation    Number of Visits Requested:   1    Requested Prescriptions    No prescriptions requested or ordered in this encounter

## 2022-10-12 DIAGNOSIS — G5601 Carpal tunnel syndrome, right upper limb: Secondary | ICD-10-CM | POA: Diagnosis not present

## 2022-10-22 ENCOUNTER — Other Ambulatory Visit: Payer: Self-pay | Admitting: Family

## 2022-10-22 ENCOUNTER — Telehealth: Payer: Self-pay | Admitting: *Deleted

## 2022-10-22 DIAGNOSIS — G8929 Other chronic pain: Secondary | ICD-10-CM

## 2022-10-22 DIAGNOSIS — G5621 Lesion of ulnar nerve, right upper limb: Secondary | ICD-10-CM | POA: Insufficient documentation

## 2022-10-22 NOTE — Telephone Encounter (Signed)
He is going to [facility] but it is for his hand.  Wife would like to see someone at [facility] if possible.  Can they see them over there or do you want him to see a neurosurgeon?

## 2022-10-22 NOTE — Telephone Encounter (Signed)
-----   Message from Olive Bass, FNP sent at 10/19/2022  2:40 PM EDT ----- The neurologist in August is to follow up on their concerns for his memory changes. The herniated disc would need to be with an orthopedist. I thought he already had one that he was working with for back issues. That is who he needs to see about this.  ----- Message ----- From: Judieth Keens, CMA Sent: 10/18/2022  11:34 AM EDT To: Olive Bass, FNP   ----- Message ----- From: Olive Bass, FNP Sent: 10/18/2022   9:55 AM EDT To: Judieth Keens, CMA  He does have a herniated disc in his neck- I thought he was planning to see specialist to follow up on this. Can you verify if he is scheduled to see neurosurgery?

## 2022-10-29 ENCOUNTER — Other Ambulatory Visit: Payer: Self-pay | Admitting: Family

## 2022-10-29 DIAGNOSIS — G5601 Carpal tunnel syndrome, right upper limb: Secondary | ICD-10-CM | POA: Insufficient documentation

## 2022-11-02 DIAGNOSIS — G5601 Carpal tunnel syndrome, right upper limb: Secondary | ICD-10-CM | POA: Diagnosis not present

## 2022-11-02 DIAGNOSIS — G5621 Lesion of ulnar nerve, right upper limb: Secondary | ICD-10-CM | POA: Diagnosis not present

## 2022-11-05 ENCOUNTER — Other Ambulatory Visit: Payer: Self-pay | Admitting: Family

## 2022-11-08 ENCOUNTER — Ambulatory Visit: Payer: Self-pay | Admitting: Licensed Clinical Social Worker

## 2022-11-08 NOTE — Patient Outreach (Signed)
  Care Coordination  Initial Visit Note   11/08/2022 Name: [name] MRN: [id number] DOB: 1956/03/19  [name] is a 67 y.o. year old male who sees [name] Bass, FNP for primary care. I spoke with  [name] by phone today.  What matters to the patients health and wellness today?    Patient reports no concerns or needs from Care Coordination team with health and wellness related to physical or mental heath. . Reports wife assist with managing his care.    Goals Addressed             This Visit's Progress    COMPLETED: Care coordination/ Advance Directive No follow up required       Activities and task to complete in order to accomplish goals.   Keep all upcoming appointment discussed today Continue with compliance of taking medication prescribed by Doctor Complete Advance Directive packet, per your request I am mailing you a packet to complete Have advance directive notarized and provide a copy to provider office         SDOH assessments and interventions completed:  Yes  SDOH Interventions Today    Flowsheet Row Most Recent Value  SDOH Interventions   Food Insecurity Interventions Intervention Not Indicated  Housing Interventions Intervention Not Indicated  Utilities Interventions Intervention Not Indicated  Financial Strain Interventions Intervention Not Indicated  Health Literacy Interventions Intervention Not Indicated       Care Coordination Interventions:  Yes, provided  Interventions Today    Flowsheet Row Most Recent Value  Chronic Disease   Chronic disease during today's visit Hypertension (HTN)  General Interventions   General Interventions Discussed/Reviewed General Interventions Reviewed  [Reviewed Care Coordination program]  Exercise Interventions   Exercise Discussed/Reviewed Exercise Reviewed, Physical Activity  [will start silver sneaker soon]  Education Interventions   Education Provided Provided Education  Mental Health  Interventions   Mental Health Discussed/Reviewed Mental Health Reviewed  [reports no concerns, takes meds daily and f/u with psychiatry]  Nutrition Interventions   Nutrition Discussed/Reviewed Nutrition Reviewed  [no needs]  Pharmacy Interventions   Pharmacy Dicussed/Reviewed Pharmacy Topics Reviewed  [no needs]  Advanced Directive Interventions   Advanced Directives Discussed/Reviewed Advanced Directives Discussed, Provided resource for acquiring and filling out documents       Follow up plan: No further intervention required.   Encounter Outcome:  Pt. Visit Completed   [name] Hines, LCSW Social Work Care Coordination  [facility] [name] [employer] [phone]

## 2022-11-08 NOTE — Patient Instructions (Signed)
Social Work Visit Information  Thank you for taking time to visit with me today. Please don't hesitate to contact me if I can be of assistance to you.   Following are the goals we discussed today:   Goals Addressed             This Visit's Progress    COMPLETED: Care coordination/ Advance Directive No follow up required       Activities and task to complete in order to accomplish goals.   Keep all upcoming appointment discussed today Continue with compliance of taking medication prescribed by Doctor Complete Advance Directive packet, per your request I am mailing you a packet to complete Have advance directive notarized and provide a copy to provider office          Patient does not require continued follow-up by social work. They will contact the office if needed  Please call the care guide team at [phone] if you need to cancel or reschedule your appointment.   If you or anyone you know are experiencing a Mental Health or Behavioral Health Crisis or need someone to talk to, please call the Suicide and Crisis Lifeline: 988 call the [place] National Suicide Prevention Lifeline: (819) 316-7212 or TTY: (305)195-3841 TTY 952-785-9482) to talk to a trained counselor call 1-800-273-TALK (toll free, 24 hour hotline) go to [facility] [address], [place] [phone])   Patient verbalizes understanding of instructions and care plan provided today and agrees to view in MyChart. Active MyChart status and patient understanding of how to access instructions and care plan via MyChart confirmed with patient.       Sammuel Hines, LCSW Social Work Care Coordination  [facility] Emmie Niemann [employer] [phone]

## 2022-11-10 ENCOUNTER — Other Ambulatory Visit: Payer: 59

## 2022-11-12 ENCOUNTER — Inpatient Hospital Stay: Admission: RE | Admit: 2022-11-12 | Payer: 59 | Source: Ambulatory Visit

## 2022-11-15 ENCOUNTER — Ambulatory Visit (HOSPITAL_COMMUNITY): Admission: RE | Admit: 2022-11-15 | Payer: 59 | Source: Home / Self Care | Admitting: Orthopedic Surgery

## 2022-11-15 ENCOUNTER — Other Ambulatory Visit: Payer: Self-pay | Admitting: Family

## 2022-11-15 ENCOUNTER — Encounter (HOSPITAL_COMMUNITY): Admission: RE | Payer: Self-pay | Source: Home / Self Care

## 2022-11-15 SURGERY — ULNAR NERVE DECOMPRESSION/TRANSPOSITION
Anesthesia: Regional | Laterality: Right

## 2022-11-16 ENCOUNTER — Ambulatory Visit (INDEPENDENT_AMBULATORY_CARE_PROVIDER_SITE_OTHER): Payer: 59 | Admitting: Emergency Medicine

## 2022-11-16 ENCOUNTER — Encounter: Payer: Self-pay | Admitting: Emergency Medicine

## 2022-11-16 VITALS — Ht 68.0 in | Wt 249.0 lb

## 2022-11-16 DIAGNOSIS — Z0000 Encounter for general adult medical examination without abnormal findings: Secondary | ICD-10-CM | POA: Diagnosis not present

## 2022-11-16 NOTE — Patient Instructions (Addendum)
[name] , Thank you for taking time to come for your Medicare Wellness Visit. I appreciate your ongoing commitment to your health goals. Please review the following plan we discussed and let me know if I can assist you in the future.   Referrals/Orders/Follow-Ups/Clinician Recommendations: Call to reschedule the low dose lung CT scan  This is a list of the screening recommended for you and due dates:  Health Maintenance  Topic Date Due   Zoster (Shingles) Vaccine (1 of 2) Never done   Pneumonia Vaccine (2 of 2 - PPSV23 or PCV20) 12/14/2017   COVID-19 Vaccine (3 - 2023-24 season) 12/18/2021   Screening for Lung Cancer  11/07/2022   Flu Shot  11/18/2022   Medicare Annual Wellness Visit  11/16/2023   Colon Cancer Screening  10/29/2026   DTaP/Tdap/Td vaccine (3 - Td or Tdap) 10/20/2027   Hepatitis C Screening  Completed   HIV Screening  Completed   HPV Vaccine  Aged Out    Advanced directives: Information on Advanced Care Planning can be found at [facility] Advance Health Care Directives Advance Health Care Directives ([email or web address]  Please remember to complete your forms and bring to doctor's office to be scanned to your chart.  Next Medicare Annual Wellness Visit scheduled for next year: Yes, 11/18/23 @ 9:40am  Preventive Care 65 Years and Older, Male Preventive care refers to lifestyle choices and visits with your health care provider that can promote health and wellness. What does preventive care include? A yearly physical exam. This is also called an annual well check. Dental exams once or twice a year. Routine eye exams. Ask your health care provider how often you should have your eyes checked. Personal lifestyle choices, including: Daily care of your teeth and gums. Regular physical activity. Eating a healthy diet. Avoiding tobacco and drug use. Limiting alcohol use. Practicing safe sex. Taking low-dose aspirin every day. Taking vitamin and mineral  supplements as recommended by your health care provider. What happens during an annual well check? The services and screenings done by your health care provider during your annual well check will depend on your age, overall health, lifestyle risk factors, and family history of disease. Counseling  Your health care provider may ask you questions about your: Alcohol use. Tobacco use. Drug use. Emotional well-being. Home and relationship well-being. Sexual activity. Eating habits. History of falls. Memory and ability to understand (cognition). Work and work Astronomer. Reproductive health. Screening  You may have the following tests or measurements: Height, weight, and BMI. Blood pressure. Lipid and cholesterol levels. These may be checked every 5 years, or more frequently if you are over 51 years old. Skin check. Lung cancer screening. You may have this screening every year starting at age 3 if you have a 30-pack-year history of smoking and currently smoke or have quit within the past 15 years. Fecal occult blood test (FOBT) of the stool. You may have this test every year starting at age 50. Flexible sigmoidoscopy or colonoscopy. You may have a sigmoidoscopy every 5 years or a colonoscopy every 10 years starting at age 80. Hepatitis C blood test. Hepatitis B blood test. Sexually transmitted disease (STD) testing. Diabetes screening. This is done by checking your blood sugar (glucose) after you have not eaten for a while (fasting). You may have this done every 1-3 years. Bone density scan. This is done to screen for osteoporosis. You may have this done starting at age 33. Mammogram. This may be done every  1-2 years. Talk to your health care provider about how often you should have regular mammograms. Talk with your health care provider about your test results, treatment options, and if necessary, the need for more tests. Vaccines  Your health care provider may recommend certain  vaccines, such as: Influenza vaccine. This is recommended every year. Tetanus, diphtheria, and acellular pertussis (Tdap, Td) vaccine. You may need a Td booster every 10 years. Zoster vaccine. You may need this after age 25. Pneumococcal 13-valent conjugate (PCV13) vaccine. One dose is recommended after age 53. Pneumococcal polysaccharide (PPSV23) vaccine. One dose is recommended after age 95. Talk to your health care provider about which screenings and vaccines you need and how often you need them. This information is not intended to replace advice given to you by your health care provider. Make sure you discuss any questions you have with your health care provider. Document Released: 05/02/2015 Document Revised: 12/24/2015 Document Reviewed: 02/04/2015 Elsevier Interactive Patient Education  2017 [employer].  Fall Prevention in the Home Falls can cause injuries. They can happen to people of all ages. There are many things you can do to make your home safe and to help prevent falls. What can I do on the outside of my home? Regularly fix the edges of walkways and driveways and fix any cracks. Remove anything that might make you trip as you walk through a door, such as a raised step or threshold. Trim any bushes or trees on the path to your home. Use bright outdoor lighting. Clear any walking paths of anything that might make someone trip, such as rocks or tools. Regularly check to see if handrails are loose or broken. Make sure that both sides of any steps have handrails. Any raised decks and porches should have guardrails on the edges. Have any leaves, snow, or ice cleared regularly. Use sand or salt on walking paths during winter. Clean up any spills in your garage right away. This includes oil or grease spills. What can I do in the bathroom? Use night lights. Install grab bars by the toilet and in the tub and shower. Do not use towel bars as grab bars. Use non-skid mats or decals in  the tub or shower. If you need to sit down in the shower, use a plastic, non-slip stool. Keep the floor dry. Clean up any water that spills on the floor as soon as it happens. Remove soap buildup in the tub or shower regularly. Attach bath mats securely with double-sided non-slip rug tape. Do not have throw rugs and other things on the floor that can make you trip. What can I do in the bedroom? Use night lights. Make sure that you have a light by your bed that is easy to reach. Do not use any sheets or blankets that are too big for your bed. They should not hang down onto the floor. Have a firm chair that has side arms. You can use this for support while you get dressed. Do not have throw rugs and other things on the floor that can make you trip. What can I do in the kitchen? Clean up any spills right away. Avoid walking on wet floors. Keep items that you use a lot in easy-to-reach places. If you need to reach something above you, use a strong step stool that has a grab bar. Keep electrical cords out of the way. Do not use floor polish or wax that makes floors slippery. If you must use wax, use non-skid  floor wax. Do not have throw rugs and other things on the floor that can make you trip. What can I do with my stairs? Do not leave any items on the stairs. Make sure that there are handrails on both sides of the stairs and use them. Fix handrails that are broken or loose. Make sure that handrails are as long as the stairways. Check any carpeting to make sure that it is firmly attached to the stairs. Fix any carpet that is loose or worn. Avoid having throw rugs at the top or bottom of the stairs. If you do have throw rugs, attach them to the floor with carpet tape. Make sure that you have a light switch at the top of the stairs and the bottom of the stairs. If you do not have them, ask someone to add them for you. What else can I do to help prevent falls? Wear shoes that: Do not have high  heels. Have rubber bottoms. Are comfortable and fit you well. Are closed at the toe. Do not wear sandals. If you use a stepladder: Make sure that it is fully opened. Do not climb a closed stepladder. Make sure that both sides of the stepladder are locked into place. Ask someone to hold it for you, if possible. Clearly mark and make sure that you can see: Any grab bars or handrails. First and last steps. Where the edge of each step is. Use tools that help you move around (mobility aids) if they are needed. These include: Canes. Walkers. Scooters. Crutches. Turn on the lights when you go into a dark area. Replace any light bulbs as soon as they burn out. Set up your furniture so you have a clear path. Avoid moving your furniture around. If any of your floors are uneven, fix them. If there are any pets around you, be aware of where they are. Review your medicines with your doctor. Some medicines can make you feel dizzy. This can increase your chance of falling. Ask your doctor what other things that you can do to help prevent falls. This information is not intended to replace advice given to you by your health care provider. Make sure you discuss any questions you have with your health care provider. Document Released: 01/30/2009 Document Revised: 09/11/2015 Document Reviewed: 05/10/2014 Elsevier Interactive Patient Education  2017 [employer].

## 2022-11-16 NOTE — Progress Notes (Signed)
Subjective:   [name] is a 67 y.o. male who presents for Medicare Annual/Subsequent preventive examination.  Visit Complete: Virtual  I connected with  [name] on 11/16/22 by a audio enabled telemedicine application and verified that I am speaking with the correct person using two identifiers.  Patient Location: Home  Provider Location: Home Office  I discussed the limitations of evaluation and management by telemedicine. The patient expressed understanding and agreed to proceed.  Vital Signs: Unable to obtain new vitals due to this being a telehealth visit.   Review of Systems     Cardiac Risk Factors include: advanced age (>51men, >7 women);male gender;dyslipidemia;hypertension     Objective:    Today's Vitals   11/16/22 1028 11/16/22 1029  Weight: 249 lb (112.9 kg)   Height: 5\' 8"  (1.727 m)   PainSc:  6    Body mass index is 37.86 kg/m.     11/16/2022   10:40 AM 09/08/2022    2:37 AM 08/08/2022    7:34 PM 12/31/2021   12:17 PM 11/03/2021    7:50 PM 08/12/2021    9:03 AM 08/04/2021    1:46 PM  Advanced Directives  Does Patient Have a Medical Advance Directive? No No No No No No No  Would patient like information on creating a medical advance directive? No - Patient declined   No - Patient declined No - Patient declined No - Patient declined No - Patient declined    Current Medications (verified) Outpatient Encounter Medications as of 11/16/2022  Medication Sig   acetaminophen (TYLENOL) 500 MG tablet Take 1,000 mg by mouth every 6 (six) hours as needed for moderate pain.    albuterol (PROVENTIL) (2.5 MG/3ML) 0.083% nebulizer solution INHALE 3 ML BY NEBULIZATION EVERY 6 HOURS AS NEEDED FOR WHEEZING OR SHORTNESS OF BREATH   albuterol (VENTOLIN HFA) 108 (90 Base) MCG/ACT inhaler TAKE 2 PUFFS BY MOUTH EVERY 6 HOURS AS NEEDED FOR WHEEZE OR SHORTNESS OF BREATH   amLODipine (NORVASC) 10 MG tablet TAKE 1 TABLET BY MOUTH EVERY DAY   benztropine (COGENTIN) 0.5 MG  tablet Take 1 tablet (0.5 mg total) by mouth 2 (two) times daily. As needed for side effects of Prolixin   Blood Pressure Monitoring (BLOOD PRESSURE CUFF) MISC Use daily as directed to check blood pressure   Cyanocobalamin (VITAMIN B-12 PO) Take 1 tablet by mouth daily.   cyclobenzaprine (FLEXERIL) 10 MG tablet TAKE 1 TABLET BY MOUTH EVERYDAY AT BEDTIME   diclofenac Sodium (VOLTAREN) 1 % GEL Apply 2 g topically 4 (four) times daily as needed (pain).   dicyclomine (BENTYL) 20 MG tablet Take 1 tablet (20 mg total) by mouth 2 (two) times daily as needed for spasms.   esomeprazole (NEXIUM) 40 MG capsule TAKE 1 CAPSULE BY MOUTH EVERY DAY   fluPHENAZine (PROLIXIN) 10 MG tablet TAKE 1 TABLET BY MOUTH EVERY DAY   fluticasone (FLONASE) 50 MCG/ACT nasal spray Place 2 sprays into both nostrils daily.   gabapentin (NEURONTIN) 300 MG capsule Take 1 capsule (300 mg total) by mouth 2 (two) times daily.   KLOR-CON M20 20 MEQ tablet TAKE 1 TABLET BY MOUTH EVERY DAY   rosuvastatin (CRESTOR) 10 MG tablet TAKE 1 TABLET BY MOUTH EVERY DAY   sildenafil (VIAGRA) 100 MG tablet Take 100 mg by mouth daily as needed for erectile dysfunction.   SYMBICORT 160-4.5 MCG/ACT inhaler INHALE 2 PUFFS INTO THE LUNGS IN THE MORNING AND 2 PUFFS AT BEDTIME   tamsulosin (FLOMAX) 0.4 MG CAPS  capsule Take 0.4 mg by mouth daily.   valsartan (DIOVAN) 320 MG tablet Take 1 tablet (320 mg total) by mouth daily.   VITAMIN D PO Take 1 tablet by mouth daily.   No facility-administered encounter medications on file as of 11/16/2022.    Allergies (verified) Iohexol, Ivp dye [iodinated contrast media], Metoclopramide hcl, Ritalin [methylphenidate], and Wellbutrin [bupropion]   History: Past Medical History:  Diagnosis Date   ALLERGIC RHINITIS 01/25/2007   ANXIETY DISORDER, GENERALIZED 01/25/2007   Arthritis    Chronic kidney disease    stage 3   COPD (chronic obstructive pulmonary disease) (HCC)    DEPRESSION 01/25/2007   ESOPHAGITIS  12/28/2007   Fatty liver 10/09/2010   GERD 01/25/2007   GLUCOSE INTOLERANCE, HX OF 01/25/2007   Heart murmur    hx of    HEMORRHOIDS, INTERNAL, WITH BLEEDING 04/26/2008   Hiatal hernia    HYPERLIPIDEMIA 12/27/2007   HYPERTENSION, ESSENTIAL NOS 01/25/2007   Hyperthyroidism 08/11/2010   HYPERTROPHY PROSTATE W/UR OBST & OTH LUTS 02/27/2010   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 Abbey Chatters   COLONOSCOPY  04/23/2010   normal   COLONOSCOPY WITH PROPOFOL  01/22/2021   poor prep[   ESOPHAGOGASTRODUODENOSCOPY N/A 12/31/2013   Procedure: ESOPHAGOGASTRODUODENOSCOPY (EGD);  Surgeon: Iva Boop, MD;  Location: [facility];  Service: Endoscopy;  Laterality: N/A;   Excision of scalp lesion  07/18/2009   Excision of thyroid mass     GANGLION CYST EXCISION     right foot x 2   HERNIA REPAIR     ventral   INCISIONAL HERNIA REPAIR  12/01/2011   Procedure: LAPAROSCOPIC INCISIONAL HERNIA;  Surgeon: Shelly Rubenstein, MD;  Location: [facility] OR;  Service: General;  Laterality: N/A;   SHOULDER SURGERY     right   UPPER GASTROINTESTINAL ENDOSCOPY     Family History  Problem Relation Age of Onset   Hypertension Mother    Bone cancer Mother    Hypertension Father    Kidney disease Father    Prostate cancer Brother    Bone cancer Brother    Kidney disease Brother    Mental illness Maternal Grandmother    Mental illness Son    Prostate cancer Other    Colon cancer Neg Hx    Colon polyps Neg Hx    Esophageal cancer Neg Hx    Rectal cancer Neg Hx    Stomach cancer Neg Hx    Social History    Socioeconomic History   Marital status: Married    Spouse name: [name]   Number of children: 2   Years of education: Not on file   Highest education level: 11th grade  Occupational History   Occupation: Disabled    Employer: DISABLED  Tobacco Use   Smoking status: Every Day    Current packs/day: 1.50    Average packs/day: 1.5 packs/day for 53.6 years (80.4 ttl pk-yrs)    Types: Cigarettes    Start date: 1971    Passive exposure: Never   Smokeless tobacco: Never   Tobacco comments:    Started  smoking at age 47,still smoking pack - 1-1/2 packs  per day 01/08/2021  Vaping Use   Vaping status: Never Used  Substance and Sexual Activity   Alcohol use: No   Drug use: Yes    Frequency: 1.0 times per week    Types: Marijuana    Comment: last used 10/23/21   Sexual activity: Yes    Partners: Female  Other Topics Concern   Not on file  Social History Narrative   HSG disabled due to schizophrenia - 1983. stable long-term marriage with children. cared for his father-in-law- passed away in 2022/11/27   Social Determinants of Health   Financial Resource Strain: Low Risk  (11/16/2022)   Overall Financial Resource Strain (CARDIA)    Difficulty of Paying Living Expenses: Not hard at all  Food Insecurity: No Food Insecurity (11/16/2022)   Hunger Vital Sign    Worried About Running Out of Food in the Last Year: Never true    Ran Out of Food in the Last Year: Never true  Transportation Needs: No Transportation Needs (11/16/2022)   PRAPARE - Administrator, Civil Service (Medical): No    Lack of Transportation (Non-Medical): No  Physical Activity: Inactive (11/16/2022)   Exercise Vital Sign    Days of Exercise per Week: 0 days    Minutes of Exercise per Session: 0 min  Stress: No Stress Concern Present (11/16/2022)   [employer] of Occupational Health - Occupational Stress Questionnaire    Feeling of Stress : Only a little  Social Connections: Moderately Isolated (11/16/2022)    Social Connection and Isolation Panel [NHANES]    Frequency of Communication with Friends and Family: More than three times a week    Frequency of Social Gatherings with Friends and Family: Twice a week    Attends Religious Services: Never    Database administrator or Organizations: No    Attends Engineer, structural: Never    Marital Status: Married    Tobacco Counseling Ready to quit: Not Answered Counseling given: Not Answered Tobacco comments: Started smoking at age 40,still smoking pack - 1-1/2 packs  per day 01/08/2021   Clinical Intake:  Pre-visit preparation completed: Yes  Pain : 0-10 Pain Score: 6  Pain Type: Chronic pain Pain Location: Back Pain Descriptors / Indicators: Aching     BMI - recorded: 37.86 Nutritional Status: BMI > 30  Obese Nutritional Risks: None Diabetes: No  How often do you need to have someone help you when you read instructions, pamphlets, or other written materials from your doctor or pharmacy?: 4 - Often What is the last grade level you completed in school?: 11  Interpreter Needed?: No  Information entered by :: Tora Kindred, CMA   Activities of Daily Living    11/16/2022   10:32 AM  In your present state of health, do you have any difficulty performing the following activities:  Hearing? 0  Vision? 0  Difficulty concentrating or making decisions? 0  Walking or climbing stairs? 0  Dressing or bathing? 0  Doing errands, shopping? 0  Preparing Food and eating ? N  Using the Toilet? N  In the past six months, have you accidently leaked urine? N  Do you have problems with loss of bowel control? N  Managing your Medications? Y  Comment wife fills pill box  Managing your Finances? N  Housekeeping or managing your Housekeeping? N    Patient Care Team: Olive Bass, FNP as PCP - General (  Internal Medicine) Martina Sinner, MD (Inactive) as Referring Physician (Psychiatry) Loreta Ave, MD (Inactive) as  Consulting Physician (Orthopedic Surgery)  Indicate any recent Medical Services you may have received from other than [facility] providers in the past year (date may be approximate).     Assessment:   This is a routine wellness examination for Blease.  Hearing/Vision screen Hearing Screening - Comments:: Denies hearing loss  Dietary issues and exercise activities discussed:     Goals Addressed               This Visit's Progress     Exercise 3x per week (30 min per time) (pt-stated)        Join [employer] at the gym      Depression Screen    11/16/2022   10:43 AM 08/20/2022    2:52 PM 04/06/2022    2:20 PM 12/16/2021    2:27 PM 09/15/2021    3:03 PM 08/04/2021    1:52 PM 08/04/2021    1:47 PM  PHQ 2/9 Scores  PHQ - 2 Score 0 0    0 0  PHQ- 9 Score 0 0          Information is confidential and restricted. Go to Review Flowsheets to unlock data.     Fall Risk    11/16/2022   10:46 AM 08/20/2022    2:52 PM 08/04/2021    1:46 PM 03/10/2021    2:44 PM 11/20/2020    1:36 PM  Fall Risk   Falls in the past year? 1 1 1 1  0  Number falls in past yr: 1 1 1  0 0  Injury with Fall? 1 1 1 1  0  Risk for fall due to : Impaired balance/gait;History of fall(s) History of fall(s) History of fall(s) Impaired balance/gait;History of fall(s) No Fall Risks  Follow up Falls prevention discussed;Education provided;Falls evaluation completed Falls evaluation completed Falls evaluation completed Falls evaluation completed;Falls prevention discussed Falls evaluation completed    MEDICARE RISK AT HOME:  Medicare Risk at Home - 11/16/22 1048     Any stairs in or around the home? No    If so, are there any without handrails? No    Home free of loose throw rugs in walkways, pet beds, electrical cords, etc? Yes    Adequate lighting in your home to reduce risk of falls? Yes    Life alert? No    Use of a cane, walker or w/c? No    Grab bars in the bathroom? Yes    Shower chair or bench in  shower? No    Elevated toilet seat or a handicapped toilet? Yes             TIMED UP AND GO:  Was the test performed?  No    Cognitive Function:        11/16/2022   10:49 AM 08/04/2021    1:57 PM  6CIT Screen  What Year? 0 points 0 points  What month? 0 points 0 points  What time? 0 points 0 points  Count back from 20 0 points 0 points  Months in reverse 4 points 4 points  Repeat phrase 0 points 2 points  Total Score 4 points 6 points    Immunizations Immunization History  Administered Date(s) Administered   Janssen (J&J) SARS-COV-2 Vaccination 06/29/2019   PFIZER Comirnaty(Gray Top)Covid-19 Tri-Sucrose Vaccine 08/05/2020   Pneumococcal Conjugate-13 10/19/2017   Td 04/19/1992   Tdap 10/19/2017    TDAP  status: Up to date  Flu Vaccine status: Declined, Education has been provided regarding the importance of this vaccine but patient still declined. Advised may receive this vaccine at local pharmacy or Health Dept. Aware to provide a copy of the vaccination record if obtained from local pharmacy or Health Dept. Verbalized acceptance and understanding.  Pneumococcal vaccine status: Completed during today's visit.  Covid-19 vaccine status: Declined, Education has been provided regarding the importance of this vaccine but patient still declined. Advised may receive this vaccine at local pharmacy or Health Dept.or vaccine clinic. Aware to provide a copy of the vaccination record if obtained from local pharmacy or Health Dept. Verbalized acceptance and understanding.  Qualifies for Shingles Vaccine? Yes   Zostavax completed No   Shingrix Completed?: No.    Education has been provided regarding the importance of this vaccine. Patient has been advised to call insurance company to determine out of pocket expense if they have not yet received this vaccine. Advised may also receive vaccine at local pharmacy or Health Dept. Verbalized acceptance and understanding.  Screening  Tests Health Maintenance  Topic Date Due   Zoster Vaccines- Shingrix (1 of 2) Never done   Pneumonia Vaccine 38+ Years old (2 of 2 - PPSV23 or PCV20) 12/14/2017   COVID-19 Vaccine (3 - 2023-24 season) 12/18/2021   Lung Cancer Screening  11/07/2022   INFLUENZA VACCINE  11/18/2022   Medicare Annual Wellness (AWV)  11/16/2023   Colonoscopy  10/29/2026   DTaP/Tdap/Td (3 - Td or Tdap) 10/20/2027   Hepatitis C Screening  Completed   HIV Screening  Completed   HPV VACCINES  Aged Out    Health Maintenance  Health Maintenance Due  Topic Date Due   Zoster Vaccines- Shingrix (1 of 2) Never done   Pneumonia Vaccine 58+ Years old (2 of 2 - PPSV23 or PCV20) 12/14/2017   COVID-19 Vaccine (3 - 2023-24 season) 12/18/2021   Lung Cancer Screening  11/07/2022    Colorectal cancer screening: Type of screening: Colonoscopy. Completed 10/28/21. Repeat every 5 years  Lung Cancer Screening: (Low Dose CT Chest recommended if Age 77-80 years, 20 pack-year currently smoking OR have quit w/in 15years.) does qualify.   Lung Cancer Screening Referral: order placed 11/30/21. Was scheduled and patient had to cancel due to illness. Will call to reschedule  Additional Screening:  Hepatitis C Screening: does qualify; Completed 10/19/17  Vision Screening: Recommended annual ophthalmology exams for early detection of glaucoma and other disorders of the eye.  Dental Screening: Recommended annual dental exams for proper oral hygiene. Patient has dentures.    Community Resource Referral / Chronic Care Management: CRR required this visit?  No   CCM required this visit?  No     Plan:     I have personally reviewed and noted the following in the patient's chart:   Medical and social history Use of alcohol, tobacco or illicit drugs  Current medications and supplements including opioid prescriptions. Patient is not currently taking opioid prescriptions. Functional ability and status Nutritional  status Physical activity Advanced directives List of other physicians Hospitalizations, surgeries, and ER visits in previous 12 months Vitals Screenings to include cognitive, depression, and falls Referrals and appointments  In addition, I have reviewed and discussed with patient certain preventive protocols, quality metrics, and best practice recommendations. A written personalized care plan for preventive services as well as general preventive health recommendations were provided to patient.     Tora Kindred, [facility]   11/16/2022  After Visit Summary: (MyChart) Due to this being a telephonic visit, the after visit summary with patients personalized plan was offered to patient via MyChart   Nurse Notes: 6 CIT Score - 4 Patient will call to reschedule low dose lung CT scan

## 2022-11-24 ENCOUNTER — Other Ambulatory Visit: Payer: Self-pay | Admitting: Neurology

## 2022-11-24 ENCOUNTER — Other Ambulatory Visit: Payer: Self-pay | Admitting: Psychiatry

## 2022-11-24 ENCOUNTER — Other Ambulatory Visit: Payer: Self-pay | Admitting: Family

## 2022-11-24 DIAGNOSIS — F203 Undifferentiated schizophrenia: Secondary | ICD-10-CM

## 2022-11-24 NOTE — Telephone Encounter (Signed)
Discontinued by: Olive Bass, FNP on 10/07/2022 11:00  Is is safe for he pt to resume taking the celebrex?

## 2022-11-27 ENCOUNTER — Other Ambulatory Visit: Payer: Self-pay | Admitting: Psychiatry

## 2022-11-27 DIAGNOSIS — F203 Undifferentiated schizophrenia: Secondary | ICD-10-CM

## 2022-11-27 DIAGNOSIS — F418 Other specified anxiety disorders: Secondary | ICD-10-CM

## 2022-11-30 ENCOUNTER — Institutional Professional Consult (permissible substitution): Payer: Medicare Other | Admitting: Neurology

## 2022-12-04 ENCOUNTER — Other Ambulatory Visit: Payer: Self-pay | Admitting: Psychiatry

## 2022-12-04 DIAGNOSIS — F203 Undifferentiated schizophrenia: Secondary | ICD-10-CM

## 2022-12-13 DIAGNOSIS — R3912 Poor urinary stream: Secondary | ICD-10-CM | POA: Diagnosis not present

## 2022-12-14 ENCOUNTER — Encounter: Payer: Self-pay | Admitting: Psychiatry

## 2022-12-14 ENCOUNTER — Other Ambulatory Visit: Payer: Self-pay | Admitting: Family

## 2022-12-14 ENCOUNTER — Ambulatory Visit (INDEPENDENT_AMBULATORY_CARE_PROVIDER_SITE_OTHER): Payer: 59 | Admitting: Psychiatry

## 2022-12-14 VITALS — BP 126/89 | HR 96 | Temp 98.4°F | Ht 68.0 in | Wt 254.2 lb

## 2022-12-14 DIAGNOSIS — F418 Other specified anxiety disorders: Secondary | ICD-10-CM | POA: Diagnosis not present

## 2022-12-14 DIAGNOSIS — F17200 Nicotine dependence, unspecified, uncomplicated: Secondary | ICD-10-CM | POA: Diagnosis not present

## 2022-12-14 DIAGNOSIS — F203 Undifferentiated schizophrenia: Secondary | ICD-10-CM

## 2022-12-14 DIAGNOSIS — G4700 Insomnia, unspecified: Secondary | ICD-10-CM

## 2022-12-14 MED ORDER — TRAZODONE HCL 50 MG PO TABS
25.0000 mg | ORAL_TABLET | Freq: Every day | ORAL | 0 refills | Status: DC
Start: 2022-12-14 — End: 2023-03-11

## 2022-12-14 NOTE — Progress Notes (Unsigned)
BH MD OP Progress Note  12/14/2022 2:31 PM [name]  MRN:  [id number]  Chief Complaint:  Chief Complaint  Patient presents with   Follow-up   Insomnia   Hallucinations   Medication Refill   HPI: [name] is a 67 year old African-American male on disability, lives in [place] with his wife, has a history of schizophrenia, essential hypertension, coronary artery disease, prostate cancer, chronic pain, history of multiple surgeries was evaluated in the office today.  Patient today reports he is currently struggling with pain, he has pain of his elbow, right shoulder and back.  Patient reports he has reached out to his primary care provider for muscle relaxant since he ran out recently.  That definitely helped.  The pain does keep him awake at night.  He hence has been struggling with sleep.  Patient reports he does feel anxious when he is in a lot of pain otherwise doing well with his mood.  Denies any significant depression symptoms.  Patient reports appetite is fair.  He however has been worried about his weight gain.  He is motivated to start working on his diet and maybe start walking more.  Patient denies any suicidality, homicidality or perceptual disturbances.  Patient denies any other concerns today.  Visit Diagnosis:    ICD-10-CM   1. Undifferentiated schizophrenia (HCC)  F20.3     2. Other specified anxiety disorders  F41.8    Generalized anxiety not occurring more days than not    3. Insomnia, unspecified type  G47.00 traZODone (DESYREL) 50 MG tablet    4. Tobacco use disorder  F17.200       Past Psychiatric History: I have reviewed past psychiatric history from progress note on 06/08/2021.  Multiple medication trials in the past including Navane, Prolixin.  Multiple inpatient behavioral health admissions in the past.  Past Medical History:  Past Medical History:  Diagnosis Date   ALLERGIC RHINITIS 01/25/2007   ANXIETY DISORDER, GENERALIZED 01/25/2007    Arthritis    Chronic kidney disease    stage 3   COPD (chronic obstructive pulmonary disease) (HCC)    DEPRESSION 01/25/2007   ESOPHAGITIS 12/28/2007   Fatty liver 10/09/2010   GERD 01/25/2007   GLUCOSE INTOLERANCE, HX OF 01/25/2007   Heart murmur    hx of    HEMORRHOIDS, INTERNAL, WITH BLEEDING 04/26/2008   Hiatal hernia    HYPERLIPIDEMIA 12/27/2007   HYPERTENSION, ESSENTIAL NOS 01/25/2007   Hyperthyroidism 08/11/2010   HYPERTROPHY PROSTATE W/UR OBST & OTH LUTS 02/27/2010   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 Abbey Chatters   COLONOSCOPY  04/23/2010   normal   COLONOSCOPY WITH PROPOFOL  01/22/2021   poor prep[   ESOPHAGOGASTRODUODENOSCOPY N/A 12/31/2013   Procedure: ESOPHAGOGASTRODUODENOSCOPY (EGD);  Surgeon: Iva Boop, MD;  Location: [facility];  Service: Endoscopy;  Laterality: N/A;   Excision of scalp lesion  07/18/2009   Excision of thyroid mass     GANGLION CYST EXCISION     right foot x 2   HERNIA REPAIR     ventral   INCISIONAL HERNIA REPAIR  12/01/2011   Procedure: LAPAROSCOPIC INCISIONAL HERNIA;  Surgeon: Shelly Rubenstein, MD;  Location: [facility];  Service: General;  Laterality: N/A;   SHOULDER SURGERY     right   UPPER GASTROINTESTINAL ENDOSCOPY      Family Psychiatric History: Reviewed family psychiatric history from progress note on 06/08/2021.  Family History:  Family History  Problem Relation Age of Onset   Hypertension Mother    Bone cancer Mother    Hypertension Father    Kidney disease Father    Prostate cancer Brother    Bone cancer Brother     Kidney disease Brother    Mental illness Maternal Grandmother    Mental illness Son    Prostate cancer Other    Colon cancer Neg Hx    Colon polyps Neg Hx    Esophageal cancer Neg Hx    Rectal cancer Neg Hx    Stomach cancer Neg Hx     Social History: Reviewed social history from progress note on 06/08/2021. Social History   Socioeconomic History   Marital status: Married    Spouse name: [name]   Number of children: 2   Years of education: Not on file   Highest education level: 11th grade  Occupational History   Occupation: Disabled    Employer: DISABLED  Tobacco Use   Smoking status: Every Day    Current packs/day: 1.50    Average packs/day: 1.5 packs/day for 53.7 years (80.5 ttl pk-yrs)    Types: Cigarettes    Start date: 1971    Passive exposure: Never   Smokeless tobacco: Never   Tobacco comments:    Started smoking at age 45,still smoking pack - 1-1/2 packs  per day 01/08/2021  Vaping Use   Vaping status: Never Used  Substance and Sexual Activity   Alcohol use: No   Drug use: Yes    Frequency: 1.0 times per week    Types: Marijuana    Comment: last used 10/23/21   Sexual activity: Yes    Partners: Female  Other Topics Concern   Not on file  Social History Narrative   HSG disabled due to schizophrenia - 1983. stable long-term marriage with children. cared for his father-in-law- passed away in 01-11-23   Social Determinants of Health   Financial Resource Strain: Low Risk  (11/16/2022)   Overall Financial Resource Strain (CARDIA)    Difficulty of Paying Living Expenses: Not hard at all  Food Insecurity: No Food Insecurity (11/16/2022)   Hunger Vital Sign    Worried About Running Out of Food in the Last Year: Never true    Ran Out of Food in the Last Year: Never true  Transportation Needs: No Transportation Needs (11/16/2022)   PRAPARE - Administrator, Civil Service (Medical): No    Lack of Transportation (Non-Medical): No  Physical Activity: Inactive  (11/16/2022)   Exercise Vital Sign    Days of Exercise per Week: 0 days    Minutes of Exercise per Session: 0 min  Stress: No Stress Concern Present (11/16/2022)   [employer] of Occupational Health - Occupational Stress Questionnaire    Feeling of Stress : Only a little  Social Connections: Moderately Isolated (11/16/2022)   Social Connection and Isolation Panel [NHANES]    Frequency of Communication with Friends and Family: More than three times a week    Frequency of Social Gatherings with Friends and Family: Twice a week    Attends Religious Services: Never    Active  Member of Clubs or Organizations: No    Attends Banker Meetings: Never    Marital Status: Married    Allergies:  Allergies  Allergen Reactions   Iohexol Other (See Comments)    Affects nerves: triggers schizophrenia   Ivp Dye [Iodinated Contrast Media]      triggers schizophrenia   Metoclopramide Hcl Other (See Comments)    Affects nerves: triggers schizophrenia   Ritalin [Methylphenidate]      triggers schizophrenia   Wellbutrin [Bupropion] Other (See Comments)    Makes pt smoke heavily    Metabolic Disorder Labs: Lab Results  Component Value Date   HGBA1C 5.9 (H) 08/20/2022   MPG 123 08/20/2022   No results found for: "PROLACTIN" Lab Results  Component Value Date   CHOL 214 (H) 11/19/2021   TRIG 234.0 (H) 11/19/2021   HDL 38.60 (L) 11/19/2021   CHOLHDL 6 11/19/2021   VLDL 46.8 (H) 11/19/2021   LDLCALC 99 10/24/2018   LDLCALC 110 (H) 10/19/2017   Lab Results  Component Value Date   TSH 1.98 08/20/2022   TSH 1.73 05/21/2021    Therapeutic Level Labs: No results found for: "LITHIUM" No results found for: "VALPROATE" No results found for: "CBMZ"  Current Medications: Current Outpatient Medications  Medication Sig Dispense Refill   acetaminophen (TYLENOL) 500 MG tablet Take 1,000 mg by mouth every 6 (six) hours as needed for moderate pain.      albuterol (PROVENTIL) (2.5  MG/3ML) 0.083% nebulizer solution INHALE 3 ML BY NEBULIZATION EVERY 6 HOURS AS NEEDED FOR WHEEZING OR SHORTNESS OF BREATH 150 mL 0   albuterol (VENTOLIN HFA) 108 (90 Base) MCG/ACT inhaler TAKE 2 PUFFS BY MOUTH EVERY 6 HOURS AS NEEDED FOR WHEEZE OR SHORTNESS OF BREATH 8.5 each 6   amLODipine (NORVASC) 10 MG tablet TAKE 1 TABLET BY MOUTH EVERY DAY 90 tablet 3   benztropine (COGENTIN) 0.5 MG tablet TAKE 1 TABLET (0.5 MG TOTAL) BY MOUTH 2 (TWO) TIMES DAILY. AS NEEDED FOR SIDE EFFECTS OF PROLIXIN 180 tablet 1   Blood Pressure Monitoring (BLOOD PRESSURE CUFF) MISC Use daily as directed to check blood pressure 1 each 0   celecoxib (CELEBREX) 50 MG capsule TAKE 1 CAPSULE (50 MG TOTAL) BY MOUTH 2 (TWO) TIMES DAILY AS NEEDED FOR PAIN. 60 capsule 6   Cyanocobalamin (VITAMIN B-12 PO) Take 1 tablet by mouth daily.     cyclobenzaprine (FLEXERIL) 10 MG tablet TAKE 1 TABLET BY MOUTH EVERYDAY AT BEDTIME 30 tablet 3   diclofenac Sodium (VOLTAREN) 1 % GEL Apply 2 g topically 4 (four) times daily as needed (pain). 150 g 1   dicyclomine (BENTYL) 20 MG tablet Take 1 tablet (20 mg total) by mouth 2 (two) times daily as needed for spasms. 20 tablet 0   esomeprazole (NEXIUM) 40 MG capsule TAKE 1 CAPSULE BY MOUTH EVERY DAY 90 capsule 3   fluPHENAZine (PROLIXIN) 10 MG tablet TAKE 1 TABLET BY MOUTH EVERY DAY 90 tablet 1   fluticasone (FLONASE) 50 MCG/ACT nasal spray SPRAY 2 SPRAYS INTO EACH NOSTRIL EVERY DAY 48 mL 0   gabapentin (NEURONTIN) 300 MG capsule TAKE 1 CAPSULE BY MOUTH TWICE A DAY 180 capsule 3   hydrOXYzine (ATARAX) 25 MG tablet TAKE 0.5-1 TABLETS (12.5-25 MG TOTAL) BY MOUTH DAILY AS NEEDED FOR ANXIETY. PLEASE LIMIT USE 90 tablet 0   KLOR-CON M20 20 MEQ tablet TAKE 1 TABLET BY MOUTH EVERY DAY 90 tablet 3   rosuvastatin (CRESTOR) 10 MG tablet TAKE 1 TABLET  BY MOUTH EVERY DAY 90 tablet 3   sildenafil (VIAGRA) 100 MG tablet Take 100 mg by mouth daily as needed for erectile dysfunction.     tamsulosin (FLOMAX) 0.4 MG  CAPS capsule Take 0.4 mg by mouth daily.     traZODone (DESYREL) 50 MG tablet Take 0.5-1 tablets (25-50 mg total) by mouth at bedtime. For sleep 90 tablet 0   valsartan (DIOVAN) 320 MG tablet Take 1 tablet (320 mg total) by mouth daily. 90 tablet 1   VITAMIN D PO Take 1 tablet by mouth daily.     SYMBICORT 160-4.5 MCG/ACT inhaler INHALE 2 PUFFS INTO THE LUNGS IN THE MORNING AND 2 PUFFS AT BEDTIME 30.6 each 1   No current facility-administered medications for this visit.     Musculoskeletal: Strength & Muscle Tone: within normal limits Gait & Station: normal Patient leans: N/A  Psychiatric Specialty Exam: Review of Systems  Psychiatric/Behavioral:  Positive for sleep disturbance. The patient is nervous/anxious.     Blood pressure 126/89, pulse 96, temperature 98.4 F (36.9 C), temperature source Skin, height 5\' 8"  (1.727 m), weight 254 lb 3.2 oz (115.3 kg).Body mass index is 38.65 kg/m.  General Appearance: Fairly Groomed  Eye Contact:  Fair  Speech:  Clear and Coherent  Volume:  Normal  Mood:  Anxious  Affect:  Congruent  Thought Process:  Goal Directed and Descriptions of Associations: Intact  Orientation:  Full (Time, Place, and Person)  Thought Content: Logical   Suicidal Thoughts:  No  Homicidal Thoughts:  No  Memory:  Immediate;   Fair Recent;   Fair Remote;   Fair  Judgement:  Fair  Insight:  Fair  Psychomotor Activity:  Normal  Concentration:  Concentration: Fair and Attention Span: Fair  Recall:  [employer] of Knowledge: Fair  Language: Fair  Akathisia:  No  Handed:  Right  AIMS (if indicated): done  Assets:  Communication Skills Desire for Improvement Housing Social Support Transportation  ADL's:  Intact  Cognition: WNL  Sleep:  Poor   Screenings: Geneticist, molecular Office Visit from 12/14/2022 in [facility] Office Visit from 08/17/2022 in [facility] Office Visit  from 04/06/2022 in [facility] Office Visit from 12/16/2021 in [facility] Office Visit from 09/15/2021 in [facility]  AIMS Total Score 0 0 0 0 0      GAD-7    Flowsheet Row Office Visit from 12/14/2022 in [facility] Office Visit from 12/16/2021 in [facility] Office Visit from 06/08/2021 in [facility]  Total GAD-7 Score 3 4 0      PHQ2-9    Flowsheet Row Office Visit from 12/14/2022 in [facility] Clinical Support from 11/16/2022 in [facility] at [facility] Office Visit from 08/20/2022 in [facility] at [facility] Office Visit from 04/06/2022 in [facility] Office Visit from 12/16/2021 in [facility]  PHQ-2 Total Score 0 0 0 0 0  PHQ-9 Total Score 3 0 0 0 --      Flowsheet Row Office Visit from 12/14/2022 in [facility] ED from 09/08/2022 in [facility] Emergency Department at [facility] Office Visit from 08/17/2022 in [facility]  C-SSRS RISK CATEGORY No Risk No Risk No Risk        Assessment and Plan: Ebert Bloodsworth is a 67 year old African-American male on disability, history of schizophrenia, hypertension, multiple medical problems was evaluated in the office today.  Patient currently with pain, does struggle with sleep, will benefit from medication management for the same, plan as noted below.  Plan Schizophrenia-stable Prolixin 10 mg p.o. daily Benztropine 0.5 mg p.o. twice daily as needed.  Patient to limit use.  Other specified anxiety disorder-generalized anxiety not  occurring more days than not Hydroxyzine 12.5-25 mg p.o. daily as needed for anxiety attacks   Insomnia-unspecified Patient will need sufficient pain management Start trazodone 25-50 mg p.o. nightly as needed  Tobacco use disorder-unstable Patient not willing to quit.  Review TSH-08/20/2022-within normal limits.  Collaboration of Care: Collaboration of Care: Primary Care Provider AEB patient to contact primary care provider patient in pain.  Patient/Guardian was advised Release of Information must be obtained prior to any record release in order to collaborate their care with an outside provider. Patient/Guardian was advised if they have not already done so to contact the registration department to sign all necessary forms in order for [place] to release information regarding their care.   Consent: Patient/Guardian gives verbal consent for treatment and assignment of benefits for services provided during this visit. Patient/Guardian expressed understanding and agreed to proceed.   Follow-up in clinic in 3 months or sooner if needed.  This note was generated in part or whole with voice recognition software. Voice recognition is usually quite accurate but there are transcription errors that can and very often do occur. I apologize for any typographical errors that were not detected and corrected.    Jomarie Longs, MD 12/15/2022, 12:13 PM

## 2022-12-14 NOTE — Patient Instructions (Signed)
Trazodone Tablets What is this medication? TRAZODONE (TRAZ oh done) treats depression. It increases the amount of serotonin in the brain, a hormone that helps regulate mood. This medicine may be used for other purposes; ask your health care provider or pharmacist if you have questions. COMMON BRAND NAME(S): Desyrel What should I tell my care team before I take this medication? They need to know if you have any of these conditions: Bipolar disorder Bleeding disorder Glaucoma Heart disease, or previous heart attack Irregular heartbeat or rhythm Kidney disease Liver disease Low levels of sodium in the blood Suicidal thoughts, plans, or attempt by you or a family member An unusual or allergic reaction to trazodone, other medications, foods, dyes, or preservatives Pregnant or trying to get pregnant Breastfeeding How should I use this medication? Take this medication by mouth with a glass of water. Take it as directed on the prescription label at the same time every day. Take this medication shortly after a meal or a light snack. Keep taking this medication unless your care team tells you to stop. Stopping it too quickly can cause serious side effects. It can also make your condition worse. A special MedGuide will be given to you by the pharmacist with each prescription and refill. Be sure to read this information carefully each time. Talk to your care team about the use of this medication in children. Special care may be needed. Overdosage: If you think you have taken too much of this medicine contact a poison control center or emergency room at once. NOTE: This medicine is only for you. Do not share this medicine with others. What if I miss a dose? If you miss a dose, take it as soon as you can. If it is almost time for your next dose, take only that dose. Do not take double or extra doses. What may interact with this medication? Do not take this medication with any of the following: Certain  medications for fungal infections, such as fluconazole, itraconazole, ketoconazole, posaconazole, voriconazole Cisapride Dronedarone Linezolid MAOIs, such as Carbex, Eldepryl, Marplan, Nardil, and Parnate Mesoridazine Methylene blue (injected into a vein) Pimozide Saquinavir Thioridazine This medication may also interact with the following: Alcohol Antiviral medications for HIV or AIDS Aspirin and aspirin-like medications Barbiturates, such as phenobarbital Certain medications for blood pressure, heart disease, irregular heart beat Certain medications for mental health conditions Certain medications for migraine headache, such as almotriptan, eletriptan, frovatriptan, naratriptan, rizatriptan, sumatriptan, zolmitriptan Certain medications for seizures, such as carbamazepine and phenytoin Certain medications for sleep Certain medications that treat or prevent blood clots, such as dalteparin, enoxaparin, warfarin Digoxin Fentanyl Lithium NSAIDS, medications for pain and inflammation, such as ibuprofen or naproxen Other medications that cause heart rhythm changes Rasagiline Supplements, such as St. John's wort, kava kava, valerian Tramadol Tryptophan This list may not describe all possible interactions. Give your health care provider a list of all the medicines, herbs, non-prescription drugs, or dietary supplements you use. Also tell them if you smoke, drink alcohol, or use illegal drugs. Some items may interact with your medicine. What should I watch for while using this medication? Visit your care team for regular checks on your progress. Tell your care team if your symptoms do not start to get better or if they get worse. Because it may take several weeks to see the full effects of this medication, it is important to continue your treatment as prescribed by your care team. Watch for new or worsening thoughts of suicide or depression. This   includes sudden changes in mood, behaviors,  or thoughts. These changes can happen at any time but are more common in the beginning of treatment or after a change in dose. Call your care team right away if you experience these thoughts or worsening depression. This medication may cause mood and behavior changes, such as anxiety, nervousness, irritability, hostility, restlessness, excitability, hyperactivity, or trouble sleeping. These changes can happen at any time but are more common in the beginning of treatment or after a change in dose. Call your care team right away if you notice any of these symptoms. This medication may affect your coordination, reaction time, or judgment. Do not drive or operate machinery until you know how this medication affects you. Sit up or stand slowly to reduce the risk of dizzy or fainting spells. Drinking alcohol with this medication can increase the risk of these side effects. This medication may cause dry eyes and blurred vision. If you wear contact lenses you may feel some discomfort. Lubricating drops may help. See your care team if the problem does not go away or is severe. Your mouth may get dry. Chewing sugarless gum or sucking hard candy and drinking plenty of water may help. Contact your care team if the problem does not go away or is severe. What side effects may I notice from receiving this medication? Side effects that you should report to your care team as soon as possible: Allergic reactions--skin rash, itching, hives, swelling of the face, lips, tongue, or throat Bleeding--bloody or Theroux, tar-like stools, red or dark brown urine, vomiting blood or brown material that looks like coffee grounds, small, red or purple spots on skin, unusual bleeding or bruising Heart rhythm changes--fast or irregular heartbeat, dizziness, feeling faint or lightheaded, chest pain, trouble breathing Low blood pressure--dizziness, feeling faint or lightheaded, blurry vision Low sodium level--muscle weakness, fatigue,  dizziness, headache, confusion Prolonged or painful erection Serotonin syndrome--irritability, confusion, fast or irregular heartbeat, muscle stiffness, twitching muscles, sweating, high fever, seizures, chills, vomiting, diarrhea Sudden eye pain or change in vision such as blurry vision, seeing halos around lights, vision loss Thoughts of suicide or self-harm, worsening mood, feelings of depression Side effects that usually do not require medical attention (report to your care team if they continue or are bothersome): Change in sex drive or performance Constipation Dizziness Drowsiness Dry mouth This list may not describe all possible side effects. Call your doctor for medical advice about side effects. You may report side effects to FDA at 1-800-FDA-1088. Where should I keep my medication? Keep out of the reach of children and pets. Store at room temperature between 15 and 30 degrees C (59 to 86 degrees F). Protect from light. Keep container tightly closed. Throw away any unused medication after the expiration date. NOTE: This sheet is a summary. It may not cover all possible information. If you have questions about this medicine, talk to your doctor, pharmacist, or health care provider.  2024 Elsevier/Gold Standard (2022-04-01 00:00:00)  

## 2022-12-21 ENCOUNTER — Telehealth: Payer: Self-pay | Admitting: Family

## 2022-12-21 MED ORDER — CYCLOBENZAPRINE HCL 10 MG PO TABS: ORAL_TABLET | ORAL | 3 refills | Status: DC

## 2022-12-21 NOTE — Addendum Note (Signed)
Addended by: Judieth Keens on: 12/21/2022 04:37 PM   Modules accepted: Orders

## 2022-12-21 NOTE — Telephone Encounter (Signed)
Prescription Request  12/21/2022  Is this a "Controlled Substance" medicine? No  LOV: 10/07/2022  What is the name of the medication or equipment? cyclobenzaprine (FLEXERIL) 10 MG table  Have you contacted your pharmacy to request a refill? No   Which pharmacy would you like this sent to?  [id number] [place], [place] - [address] [place] [address] Phone: [phone] Fax: [phone]    Patient notified that their request is being sent to the clinical staff for review and that they should receive a response within 2 business days.   Please advise at [facility] [phone]

## 2022-12-21 NOTE — Telephone Encounter (Signed)
Rx has been sent in. 

## 2022-12-21 NOTE — Telephone Encounter (Signed)
Pt is aware.  

## 2022-12-31 ENCOUNTER — Other Ambulatory Visit: Payer: Self-pay

## 2022-12-31 ENCOUNTER — Encounter (HOSPITAL_BASED_OUTPATIENT_CLINIC_OR_DEPARTMENT_OTHER): Payer: Self-pay | Admitting: Emergency Medicine

## 2022-12-31 ENCOUNTER — Emergency Department (HOSPITAL_BASED_OUTPATIENT_CLINIC_OR_DEPARTMENT_OTHER)
Admission: EM | Admit: 2022-12-31 | Discharge: 2022-12-31 | Disposition: A | Discharge status: Home / Self Care | Payer: 59 | Attending: Emergency Medicine | Admitting: Emergency Medicine

## 2022-12-31 DIAGNOSIS — M5136 Other intervertebral disc degeneration, lumbar region: Secondary | ICD-10-CM | POA: Diagnosis not present

## 2022-12-31 DIAGNOSIS — Z79899 Other long term (current) drug therapy: Secondary | ICD-10-CM | POA: Insufficient documentation

## 2022-12-31 DIAGNOSIS — M545 Low back pain, unspecified: Secondary | ICD-10-CM | POA: Diagnosis not present

## 2022-12-31 DIAGNOSIS — G8929 Other chronic pain: Secondary | ICD-10-CM | POA: Insufficient documentation

## 2022-12-31 MED ORDER — METHOCARBAMOL 750 MG PO TABS: 750.0000 mg | ORAL_TABLET | Freq: Three times a day (TID) | ORAL | 0 refills | Status: DC | PRN

## 2022-12-31 MED ORDER — TRAMADOL HCL 50 MG PO TABS
50.0000 mg | ORAL_TABLET | Freq: Once | ORAL | Status: AC
  Administered 2022-12-31: 50 mg via ORAL
  Filled 2022-12-31: qty 1

## 2022-12-31 MED ORDER — TRAMADOL HCL 50 MG PO TABS: 50.0000 mg | ORAL_TABLET | Freq: Four times a day (QID) | ORAL | 0 refills | Status: AC | PRN

## 2022-12-31 NOTE — ED Notes (Signed)
Patient unable to provide urine sample at this time. Collection cup and clean catch instruction given to patient

## 2022-12-31 NOTE — ED Notes (Addendum)
Reviewed discharge instructions, medications, and home care with pt. Pt verbalized understanding and had no further questions. Pt exited ED without complications. Pt has a ride home and discharged with family.

## 2022-12-31 NOTE — ED Triage Notes (Signed)
Back pain x 10 days. Getting worse,  Some numbness to both legs denies incontinent  No relief with celebrex at home

## 2022-12-31 NOTE — ED Provider Notes (Signed)
[place] EMERGENCY DEPARTMENT AT [facility] Provider Note   CSN: [id number] Arrival date & time: 12/31/22  1407     History  Chief Complaint  Patient presents with   Back Pain    [name] is a 67 y.o. male.  Pt c/o low back pain 'for past 46 years'.  Denies recent injury, fall or strain. Dull pain to lower back, bilaterally, occasionally radiates to hip, but predominant pain is in lower back. Denies saddle area or leg numbness. No weakness. No problems with normal bowel/bladder function. No fever, chills or sweats. Has tried otc meds without relief. Hx ddd. Denies prior low back surgery. No 'anterior' pain, no abd or pelvic pain.   The history is provided by the patient and medical records.  Back Pain Associated symptoms: no abdominal pain, no chest pain, no dysuria, no fever, no headaches, no numbness and no weakness        Home Medications Prior to Admission medications   Medication Sig Start Date End Date Taking? Authorizing Provider  acetaminophen (TYLENOL) 500 MG tablet Take 1,000 mg by mouth every 6 (six) hours as needed for moderate pain.     [provider]  albuterol (PROVENTIL) (2.5 MG/3ML) 0.083% nebulizer solution INHALE 3 ML BY NEBULIZATION EVERY 6 HOURS AS NEEDED FOR WHEEZING OR SHORTNESS OF BREATH 07/27/22   Olive Bass, FNP  albuterol (VENTOLIN HFA) 108 (90 Base) MCG/ACT inhaler TAKE 2 PUFFS BY MOUTH EVERY 6 HOURS AS NEEDED FOR WHEEZE OR SHORTNESS OF BREATH 07/14/22   Luciano Cutter, MD  amLODipine (NORVASC) 10 MG tablet TAKE 1 TABLET BY MOUTH EVERY DAY 11/05/22   Olive Bass, FNP  benztropine (COGENTIN) 0.5 MG tablet TAKE 1 TABLET (0.5 MG TOTAL) BY MOUTH 2 (TWO) TIMES DAILY. AS NEEDED FOR SIDE EFFECTS OF PROLIXIN 11/24/22   Myrlene Broker, MD  Blood Pressure Monitoring (BLOOD PRESSURE CUFF) MISC Use daily as directed to check blood pressure 06/01/18   Olive Bass, FNP  celecoxib (CELEBREX) 50 MG capsule TAKE  1 CAPSULE (50 MG TOTAL) BY MOUTH 2 (TWO) TIMES DAILY AS NEEDED FOR PAIN. 11/25/22   Levert Feinstein, MD  Cyanocobalamin (VITAMIN B-12 PO) Take 1 tablet by mouth daily.    [provider]  cyclobenzaprine (FLEXERIL) 10 MG tablet TAKE 1 TABLET BY MOUTH EVERYDAY AT BEDTIME 12/21/22   Olive Bass, FNP  diclofenac Sodium (VOLTAREN) 1 % GEL Apply 2 g topically 4 (four) times daily as needed (pain). 08/22/20   Olive Bass, FNP  dicyclomine (BENTYL) 20 MG tablet Take 1 tablet (20 mg total) by mouth 2 (two) times daily as needed for spasms. 11/04/21   Carroll Sage, PA-C  esomeprazole (NEXIUM) 40 MG capsule TAKE 1 CAPSULE BY MOUTH EVERY DAY 10/29/22   Olive Bass, FNP  fluPHENAZine (PROLIXIN) 10 MG tablet TAKE 1 TABLET BY MOUTH EVERY DAY 11/28/22   Jomarie Longs, MD  fluticasone (FLONASE) 50 MCG/ACT nasal spray SPRAY 2 SPRAYS INTO EACH NOSTRIL EVERY DAY 11/24/22   Olive Bass, FNP  gabapentin (NEURONTIN) 300 MG capsule TAKE 1 CAPSULE BY MOUTH TWICE A DAY 11/24/22   Olive Bass, FNP  hydrOXYzine (ATARAX) 25 MG tablet TAKE 0.5-1 TABLETS (12.5-25 MG TOTAL) BY MOUTH DAILY AS NEEDED FOR ANXIETY. PLEASE LIMIT USE 12/06/22 03/06/23  Jomarie Longs, MD  KLOR-CON M20 20 MEQ tablet TAKE 1 TABLET BY MOUTH EVERY DAY 10/22/22   Olive Bass, FNP  rosuvastatin (CRESTOR) 10 MG tablet  TAKE 1 TABLET BY MOUTH EVERY DAY 11/05/22   Olive Bass, FNP  sildenafil (VIAGRA) 100 MG tablet Take 100 mg by mouth daily as needed for erectile dysfunction. 10/28/20   [provider]  SYMBICORT 160-4.5 MCG/ACT inhaler INHALE 2 PUFFS INTO THE LUNGS IN THE MORNING AND 2 PUFFS AT BEDTIME 12/15/22   Margaree Mackintosh, MD  tamsulosin (FLOMAX) 0.4 MG CAPS capsule Take 0.4 mg by mouth daily. 09/29/18   [provider]  traZODone (DESYREL) 50 MG tablet Take 0.5-1 tablets (25-50 mg total) by mouth at bedtime. For sleep 12/14/22   Jomarie Longs, MD  valsartan (DIOVAN)  320 MG tablet Take 1 tablet (320 mg total) by mouth daily. 08/23/22   Olive Bass, FNP  VITAMIN D PO Take 1 tablet by mouth daily.    [provider]      Allergies    Iohexol, Ivp dye [iodinated contrast media], Metoclopramide hcl, Ritalin [methylphenidate], and Wellbutrin [bupropion]    Review of Systems   Review of Systems  Constitutional:  Negative for chills and fever.  Respiratory:  Negative for shortness of breath.   Cardiovascular:  Negative for chest pain.  Gastrointestinal:  Negative for abdominal pain, nausea and vomiting.  Genitourinary:  Negative for dysuria and hematuria.  Musculoskeletal:  Positive for back pain. Negative for neck pain.  Skin:  Negative for rash.  Neurological:  Negative for weakness, numbness and headaches.  Hematological:  Does not bruise/bleed easily.    Physical Exam Updated Vital Signs BP 116/80 (BP Location: Left Arm)   Pulse 98   Temp 98.1 F (36.7 C)   Resp 18   SpO2 99%  Physical Exam Vitals and nursing note reviewed.  Constitutional:      Appearance: Normal appearance. He is well-developed.  HENT:     Head: Atraumatic.     Nose: Nose normal.     Mouth/Throat:     Mouth: Mucous membranes are moist.  Eyes:     General: No scleral icterus.    Conjunctiva/sclera: Conjunctivae normal.  Neck:     Trachea: No tracheal deviation.  Cardiovascular:     Rate and Rhythm: Normal rate.     Pulses: Normal pulses.  Pulmonary:     Effort: Pulmonary effort is normal. No accessory muscle usage or respiratory distress.  Abdominal:     General: There is no distension.     Palpations: Abdomen is soft. There is no mass.     Tenderness: There is no abdominal tenderness.  Genitourinary:    Comments: No cva tenderness. Musculoskeletal:        General: No swelling.     Cervical back: Neck supple.     Comments: Bilateral lower back muscular tenderness. No skin changes, lesions, or soft tissue swelling noted in area of pain. CTLS  spine, non tender, aligned, no step off. Bil legs of normal color and warmth. Intact distal pulses. No leg swelling.    Skin:    General: Skin is warm and dry.     Findings: No rash.  Neurological:     Mental Status: He is alert.     Comments: Alert, speech clear. Motor/sens grossly intact bil. Bil legs nvi, motor 5/5, sens grossly intact. Straight leg raise neg.   Psychiatric:        Mood and Affect: Mood normal.     ED Results / Procedures / Treatments   Labs (all labs ordered are listed, but only abnormal results are displayed) Labs Reviewed  URINALYSIS, ROUTINE W REFLEX MICROSCOPIC    EKG None  Radiology No results found.  Procedures Procedures    Medications Ordered in ED Medications  traMADol (ULTRAM) tablet 50 mg (has no administration in time range)    ED Course/ Medical Decision Making/ A&P                                 Medical Decision Making Problems Addressed: Acute bilateral low back pain without sciatica: acute illness or injury with systemic symptoms that poses a threat to life or bodily functions Chronic bilateral low back pain without sciatica: chronic illness or injury with exacerbation, progression, or side effects of treatment that poses a threat to life or bodily functions Degenerative disc disease, lumbar: chronic illness or injury with exacerbation, progression, or side effects of treatment that poses a threat to life or bodily functions  Amount and/or Complexity of Data Reviewed External Data Reviewed: radiology and notes. Labs: ordered. Radiology:  Decision-making details documented in ED Course.  Risk Prescription drug management.   Ultram po.  Reviewed nursing notes and prior charts for additional history. Prior lumbar imaging reviewed/interpreted by me  - pt with prior lumbar MRI with DDD/degen changes.   Pt currently appears stable for d/c.   Rec pcp/back specialty f/u.  Return precautions provided.           Final  Clinical Impression(s) / ED Diagnoses Final diagnoses:  None    Rx / DC Orders ED Discharge Orders     None         Cathren Laine, MD 12/31/22 1516

## 2022-12-31 NOTE — Discharge Instructions (Signed)
It was our pleasure to provide your ER care today - we hope that you feel better.  Avoid bending at waist or heavy lifting > 20 lbs for  the next week. Try heat therapy to sore area.   Take acetaminophen as need for pain. You may also take ultram as need for pain, and robaxin as need for muscle pain/spasm - no driving when taking ultram or robaxin.   Follow up with primary care doctor in the next couple weeks. Also follow up with back specialist in the next few weeks - call office to arrange appointment.   Return to ER if worse, new symptoms, fevers, severe/intractable pain, numbness/weakness, or other concern.

## 2023-01-18 ENCOUNTER — Ambulatory Visit: Payer: 59 | Admitting: Family

## 2023-01-31 ENCOUNTER — Telehealth: Payer: Self-pay

## 2023-01-31 NOTE — Telephone Encounter (Signed)
Transition Care Management Unsuccessful Follow-up Telephone Call  Date of discharge and from where:  12/31/2022 [facility]  Attempts:  1st Attempt  Reason for unsuccessful TCM follow-up call:  Unable to leave message  Rawan Riendeau [facility]  [facility], [facility] Guide Direct Dial: [phone]  Website: [email or web address]

## 2023-02-03 ENCOUNTER — Telehealth: Payer: Self-pay

## 2023-02-03 NOTE — Telephone Encounter (Signed)
Transition Care Management Unsuccessful Follow-up Telephone Call  Date of discharge and from where:  12/31/2022 [facility]  Attempts:  2nd Attempt  Reason for unsuccessful TCM follow-up call:  Left voice message  Fara Worthy [facility]  [facility] Guide Direct Dial: [phone]  Website: [email or web address]

## 2023-02-04 ENCOUNTER — Other Ambulatory Visit: Payer: Self-pay | Admitting: Pulmonary Disease

## 2023-02-04 ENCOUNTER — Telehealth: Payer: Self-pay | Admitting: Family

## 2023-02-04 NOTE — Telephone Encounter (Signed)
Pt needs refill on his arthritis cream- diclofenac sodium (voltaren). Please send to [facility] on [address]

## 2023-02-08 ENCOUNTER — Other Ambulatory Visit: Payer: Self-pay | Admitting: Family

## 2023-02-08 MED ORDER — DICLOFENAC SODIUM 1 % EX GEL: 2.0000 g | Freq: Four times a day (QID) | CUTANEOUS | 0 refills | Status: AC | PRN

## 2023-02-16 ENCOUNTER — Other Ambulatory Visit: Payer: Self-pay | Admitting: Family

## 2023-03-11 ENCOUNTER — Other Ambulatory Visit: Payer: Self-pay | Admitting: Psychiatry

## 2023-03-11 DIAGNOSIS — G4700 Insomnia, unspecified: Secondary | ICD-10-CM

## 2023-03-16 ENCOUNTER — Ambulatory Visit (INDEPENDENT_AMBULATORY_CARE_PROVIDER_SITE_OTHER): Payer: 59 | Admitting: Psychiatry

## 2023-03-16 ENCOUNTER — Encounter: Payer: Self-pay | Admitting: Psychiatry

## 2023-03-16 VITALS — BP 122/82 | HR 100 | Temp 98.0°F | Ht 68.0 in | Wt 253.8 lb

## 2023-03-16 DIAGNOSIS — F418 Other specified anxiety disorders: Secondary | ICD-10-CM | POA: Diagnosis not present

## 2023-03-16 DIAGNOSIS — F203 Undifferentiated schizophrenia: Secondary | ICD-10-CM

## 2023-03-16 DIAGNOSIS — F17200 Nicotine dependence, unspecified, uncomplicated: Secondary | ICD-10-CM

## 2023-03-16 DIAGNOSIS — G4701 Insomnia due to medical condition: Secondary | ICD-10-CM

## 2023-03-16 MED ORDER — VARENICLINE TARTRATE 0.5 MG PO TABS
0.5000 mg | ORAL_TABLET | Freq: Two times a day (BID) | ORAL | 3 refills | Status: DC
Start: 2023-03-16 — End: 2023-06-16

## 2023-03-16 NOTE — Patient Instructions (Signed)
Varenicline Tablets What is this medication? VARENICLINE (var e NI kleen) helps you quit smoking. It reduces cravings for nicotine, the addictive substance found in tobacco. It is most effective when used in combination with a stop-smoking program. This medicine may be used for other purposes; ask your health care provider or pharmacist if you have questions. COMMON BRAND NAME(S): Chantix What should I tell my care team before I take this medication? They need to know if you have any of these conditions: Heart disease Frequently drink alcohol Kidney disease Mental health condition On hemodialysis Seizures History of stroke Suicidal thoughts, plans, or attempt by you or a family member An unusual or allergic reaction to varenicline, other medications, foods, dyes, or preservatives Pregnant or trying to get pregnant Breast-feeding How should I use this medication? Take this medication by mouth after eating. Take with a full glass of water. Follow the directions on the prescription label. Take your doses at regular intervals. Do not take your medication more often than directed. There are 3 ways you can use this medication to help you quit smoking; talk to your care team to decide which plan is right for you: 1) you can choose a quit date and start this medication 1 week before the quit date, or, 2) you can start taking this medication before you choose a quit date, and then pick a quit date between day 8 and 35 days of treatment, or, 3) if you are not sure that you are able or willing to quit smoking right away, start taking this medication and slowly decrease the amount you smoke as directed by your care team with the goal of being cigarette-free by week 12 of treatment. Stick to your plan; ask about support groups or other ways to help you remain cigarette-free. If you are motivated to quit smoking and did not succeed during a previous attempt with this medication for reasons other than side  effects, or if you returned to smoking after this treatment, speak with your care team about whether another course of this medication may be right for you. A special MedGuide will be given to you by the pharmacist with each prescription and refill. Be sure to read this information carefully each time. Talk to your care team about the use of this medication in children. This medication is not approved for use in children. Overdosage: If you think you have taken too much of this medicine contact a poison control center or emergency room at once. NOTE: This medicine is only for you. Do not share this medicine with others. What if I miss a dose? If you miss a dose, take it as soon as you can. If it is almost time for your next dose, take only that dose. Do not take double or extra doses. What may interact with this medication? Alcohol Insulin Other medications used to help people quit smoking Theophylline Warfarin This list may not describe all possible interactions. Give your health care provider a list of all the medicines, herbs, non-prescription drugs, or dietary supplements you use. Also tell them if you smoke, drink alcohol, or use illegal drugs. Some items may interact with your medicine. What should I watch for while using this medication? It is okay if you do not succeed at your attempt to quit and have a cigarette. You can still continue your quit attempt and keep using this medication as directed. Just throw away your cigarettes and get back to your quit plan. Talk to your care team  before using other treatments to quit smoking. Using this medication with other treatments to quit smoking may increase the risk for side effects compared to using a treatment alone. This medication may affect your coordination, reaction time, or judgment. Do not drive or operate machinery until you know how this medication affects you. Sit up or stand slowly to reduce the risk of dizzy or fainting  spells. Decrease the number of alcoholic beverages that you drink during treatment with this medication until you know if this medication affects your ability to tolerate alcohol. Some people have experienced increased drunkenness (intoxication), unusual or sometimes aggressive behavior, or no memory of things that have happened (amnesia) during treatment with this medication. You may do unusual sleep behaviors or activities you do not remember the day after taking this medication. Activities include driving, making or eating food, talking on the phone, sexual activity, or sleep walking. Stop taking this medication and call your care team right away if you find out you have done activities like this. Patients and their families should watch out for new or worsening depression or thoughts of suicide. Also watch out for sudden changes in feelings such as feeling anxious, agitated, panicky, irritable, hostile, aggressive, impulsive, severely restless, overly excited and hyperactive, or not being able to sleep. If this happens, call your care team. If you have diabetes, and you quit smoking, the effects of insulin may be increased. You may need to reduce your insulin dose. Check with your care team about how you should adjust your insulin dose. What side effects may I notice from receiving this medication? Side effects that you should report to your care team as soon as possible: Allergic reactions or angioedema--skin rash, itching or hives, swelling of the face, eyes, lips, tongue, arms, or legs, trouble swallowing or breathing Heart attack--pain or tightness in the chest, shoulders, arms, or jaw, nausea, shortness of breath, cold or clammy skin, feeling faint or lightheaded Mood and behavior changes--anxiety, nervousness, confusion, hallucinations, irritability, hostility, thoughts of suicide or self-harm, worsening mood, feelings of depression Redness, blistering, peeling, or loosening of the skin,  including inside the mouth Stroke--sudden numbness or weakness of the face, arm, or leg, trouble speaking, confusion, trouble walking, loss of balance or coordination, dizziness, severe headache, change in vision Seizures Side effects that usually do not require medical attention (report to your care team if they continue or are bothersome): Constipation Drowsiness Gas Nausea Trouble sleeping Upset stomach Vivid dreams or nightmares Vomiting This list may not describe all possible side effects. Call your doctor for medical advice about side effects. You may report side effects to FDA at 1-800-FDA-1088. Where should I keep my medication? Keep out of the reach of children and pets. Store at room temperature between 15 and 30 degrees C (59 and 86 degrees F). Throw away any unused medication after the expiration date. NOTE: This sheet is a summary. It may not cover all possible information. If you have questions about this medicine, talk to your doctor, pharmacist, or health care provider.  2024 Elsevier/Gold Standard (2021-02-26 00:00:00)

## 2023-03-16 NOTE — Progress Notes (Signed)
BH MD OP Progress Note  03/16/2023 11:31 AM [name]  MRN:  [id number]  Chief Complaint:  Chief Complaint  Patient presents with   Follow-up   Schizophrenia   Anxiety   Insomnia   Medication Refill   HPI: [name] is a 67 year old African-American male, on disability, lives in [place] with his wife, has a history of schizophrenia, tobacco use disorder, essential hypertension, coronary artery disease, prostate cancer, chronic pain, history of multiple surgeries was evaluated in office today.   The patient, a 67 year old individual with a history of schizophrenia presents with ongoing pain. Despite the discomfort, he prefers to manage the pain with ibuprofen, expressing a reluctance to use stronger, potentially addictive medications such as Percocet and tramadol. He reports that previous orthopedic consultations and MRI scans have indicated issues with the neck and back, but no definitive treatment plan has been established.  His sleep has been generally good, with the use of a muscle relaxant , Flexeril as needed. However, he reports that combining the muscle relaxant with the trazodone that was prescribed for sleep last visit results in excessive daytime sleepiness, leading to a preference to take one or the other, but not both. The sleeping pill is used approximately two to three times a week, sometimes at a half dose, sometimes a full dose, with satisfactory results.  His schizophrenia is well-managed with daily medication, Prolixin, with no recent hallucinations, paranoia, or suicidal ideation reported.   He expresses a desire to quit smoking, currently consuming a pack to a pack and a half of cigarettes daily. He is open to trying Chantix to aid in smoking cessation, despite previous concerns about potential side effects.  His diet primarily consists of home-cooked meals, with a high intake of soda, which he believes is contributing to weight gain. He expresses a  willingness to try sparkling water as a healthier alternative.   Patient is currently compliant on all prescribed medications, denies side effects.  Reports wife is supportive and he looks forward to Thanksgiving holiday.   Visit Diagnosis:    ICD-10-CM   1. Undifferentiated schizophrenia (HCC)  F20.3     2. Other specified anxiety disorders  F41.8    Generalized anxiety not occurring more days than not    3. Insomnia due to medical condition  G47.01    pain, mood    4. Tobacco use disorder  F17.200 varenicline (CHANTIX) 0.5 MG tablet      Past Psychiatric History: I have reviewed past psychiatric history from progress note on 06/08/2021.  Multiple medication trials in the past including Navane, Prolixin.  Multiple inpatient behavioral health admissions in the past.  Past Medical History:  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 Abbey Chatters   COLONOSCOPY  04/23/2010   normal   COLONOSCOPY  WITH PROPOFOL  01/22/2021   poor prep[   ESOPHAGOGASTRODUODENOSCOPY N/A 12/31/2013   Procedure: ESOPHAGOGASTRODUODENOSCOPY (EGD);  Surgeon: Iva Boop, MD;  Location: [facility];  Service: Endoscopy;  Laterality: N/A;   Excision of scalp lesion  07/18/2009   Excision of thyroid mass     GANGLION CYST EXCISION     right foot x 2   HERNIA REPAIR     ventral   INCISIONAL HERNIA REPAIR  12/01/2011   Procedure: LAPAROSCOPIC INCISIONAL HERNIA;  Surgeon: Shelly Rubenstein, MD;  Location: [facility];  Service: General;  Laterality: N/A;   SHOULDER SURGERY     right   UPPER GASTROINTESTINAL ENDOSCOPY      Family Psychiatric History: I have reviewed family psychiatric history from progress note on 06/08/2021.  Family History:  Family History  Problem Relation Age of Onset   Hypertension Mother    Bone cancer Mother    Hypertension Father    Kidney disease Father    Prostate cancer Brother    Bone cancer Brother    Kidney disease Brother    Mental illness Maternal Grandmother    Mental illness Son    Prostate cancer Other    Colon cancer Neg Hx    Colon polyps Neg Hx    Esophageal cancer Neg Hx    Rectal cancer Neg Hx    Stomach cancer Neg Hx     Social History: I have reviewed social history from progress note on 06/08/2021. Social History   Socioeconomic History   Marital status: Married    Spouse name: [name]   Number of children: 2   Years of education: Not on file   Highest education level: 11th grade  Occupational History   Occupation: Disabled    Employer: DISABLED  Tobacco Use   Smoking status: Every Day    Current packs/day: 1.50    Average packs/day: 1.5 packs/day for 53.9 years (80.9 ttl pk-yrs)    Types: Cigarettes    Start date: 1971    Passive exposure: Never   Smokeless tobacco: Never   Tobacco comments:    Started smoking at age 89,still smoking pack - 1-1/2 packs  per day 01/08/2021  Vaping Use   Vaping status: Never Used  Substance and Sexual  Activity   Alcohol use: No   Drug use: Yes    Frequency: 1.0 times per week    Types: Marijuana    Comment: last used 10/23/21   Sexual activity: Yes    Partners: Female  Other Topics Concern   Not on file  Social History Narrative   HSG disabled due to schizophrenia - 1983. stable long-term marriage with children. cared for his father-in-law- passed away in 04/01/2023   Social Determinants of Health   Financial Resource Strain: Low Risk  (11/16/2022)   Overall Financial Resource Strain (CARDIA)    Difficulty of Paying Living Expenses: Not hard at all  Food Insecurity: No Food Insecurity (11/16/2022)   Hunger Vital Sign    Worried About Running Out of Food in the Last Year: Never true    Ran Out of Food in the Last Year: Never  true  Transportation Needs: No Transportation Needs (11/16/2022)   PRAPARE - Administrator, Civil Service (Medical): No    Lack of Transportation (Non-Medical): No  Physical Activity: Inactive (11/16/2022)   Exercise Vital Sign    Days of Exercise per Week: 0 days    Minutes of Exercise per Session: 0 min  Stress: No Stress Concern Present (11/16/2022)   [employer] of Occupational Health - Occupational Stress Questionnaire    Feeling of Stress : Only a little  Social Connections: Moderately Isolated (11/16/2022)   Social Connection and Isolation Panel [NHANES]    Frequency of Communication with Friends and Family: More than three times a week    Frequency of Social Gatherings with Friends and Family: Twice a week    Attends Religious Services: Never    Database administrator or Organizations: No    Attends Banker Meetings: Never    Marital Status: Married    Allergies:  Allergies  Allergen Reactions   Iohexol Other (See Comments)    Affects nerves: triggers schizophrenia   Ivp Dye [Iodinated Contrast Media]      triggers schizophrenia   Metoclopramide Hcl Other (See Comments)    Affects nerves: triggers schizophrenia    Ritalin [Methylphenidate]      triggers schizophrenia   Wellbutrin [Bupropion] Other (See Comments)    Makes pt smoke heavily    Metabolic Disorder Labs: Lab Results  Component Value Date   HGBA1C 5.9 (H) 08/20/2022   MPG 123 08/20/2022   No results found for: "PROLACTIN" Lab Results  Component Value Date   CHOL 214 (H) 11/19/2021   TRIG 234.0 (H) 11/19/2021   HDL 38.60 (L) 11/19/2021   CHOLHDL 6 11/19/2021   VLDL 46.8 (H) 11/19/2021   LDLCALC 99 10/24/2018   LDLCALC 110 (H) 10/19/2017   Lab Results  Component Value Date   TSH 1.98 08/20/2022   TSH 1.73 05/21/2021    Therapeutic Level Labs: No results found for: "LITHIUM" No results found for: "VALPROATE" No results found for: "CBMZ"  Current Medications: Current Outpatient Medications  Medication Sig Dispense Refill   acetaminophen (TYLENOL) 500 MG tablet Take 1,000 mg by mouth every 6 (six) hours as needed for moderate pain.      albuterol (PROVENTIL) (2.5 MG/3ML) 0.083% nebulizer solution INHALE 3 ML BY NEBULIZATION EVERY 6 HOURS AS NEEDED FOR WHEEZING OR SHORTNESS OF BREATH 150 mL 0   albuterol (VENTOLIN HFA) 108 (90 Base) MCG/ACT inhaler TAKE 2 PUFFS BY MOUTH EVERY 6 HOURS AS NEEDED FOR WHEEZE OR SHORTNESS OF BREATH 8.5 each 6   amLODipine (NORVASC) 10 MG tablet TAKE 1 TABLET BY MOUTH EVERY DAY 90 tablet 3   benztropine (COGENTIN) 0.5 MG tablet TAKE 1 TABLET (0.5 MG TOTAL) BY MOUTH 2 (TWO) TIMES DAILY. AS NEEDED FOR SIDE EFFECTS OF PROLIXIN 180 tablet 1   Blood Pressure Monitoring (BLOOD PRESSURE CUFF) MISC Use daily as directed to check blood pressure 1 each 0   celecoxib (CELEBREX) 50 MG capsule TAKE 1 CAPSULE (50 MG TOTAL) BY MOUTH 2 (TWO) TIMES DAILY AS NEEDED FOR PAIN. 60 capsule 6   Cyanocobalamin (VITAMIN B-12 PO) Take 1 tablet by mouth daily.     cyclobenzaprine (FLEXERIL) 10 MG tablet TAKE 1 TABLET BY MOUTH EVERYDAY AT BEDTIME 30 tablet 3   diclofenac Sodium (VOLTAREN) 1 % GEL Apply 2 g topically 4 (four)  times daily as needed (pain). 150 g 0   dicyclomine (BENTYL) 20 MG tablet  Take 1 tablet (20 mg total) by mouth 2 (two) times daily as needed for spasms. 20 tablet 0   esomeprazole (NEXIUM) 40 MG capsule TAKE 1 CAPSULE BY MOUTH EVERY DAY 90 capsule 3   fluPHENAZine (PROLIXIN) 10 MG tablet TAKE 1 TABLET BY MOUTH EVERY DAY 90 tablet 1   fluticasone (FLONASE) 50 MCG/ACT nasal spray SPRAY 2 SPRAYS INTO EACH NOSTRIL EVERY DAY 48 mL 0   gabapentin (NEURONTIN) 300 MG capsule TAKE 1 CAPSULE BY MOUTH TWICE A DAY 180 capsule 3   KLOR-CON M20 20 MEQ tablet TAKE 1 TABLET BY MOUTH EVERY DAY 90 tablet 3   rosuvastatin (CRESTOR) 10 MG tablet TAKE 1 TABLET BY MOUTH EVERY DAY 90 tablet 3   sildenafil (VIAGRA) 100 MG tablet Take 100 mg by mouth daily as needed for erectile dysfunction.     SYMBICORT 160-4.5 MCG/ACT inhaler INHALE 2 PUFFS INTO THE LUNGS IN THE MORNING AND 2 PUFFS AT BEDTIME 30.6 each 1   traMADol (ULTRAM) 50 MG tablet Take 1 tablet (50 mg total) by mouth every 6 (six) hours as needed. 15 tablet 0   traZODone (DESYREL) 50 MG tablet TAKE 0.5-1 TABLETS (25-50 MG TOTAL) BY MOUTH AT BEDTIME. FOR SLEEP 90 tablet 3   valsartan (DIOVAN) 320 MG tablet TAKE 1 TABLET BY MOUTH EVERY DAY 90 tablet 1   varenicline (CHANTIX) 0.5 MG tablet Take 1 tablet (0.5 mg total) by mouth 2 (two) times daily. 60 tablet 3   VITAMIN D PO Take 1 tablet by mouth daily.     No current facility-administered medications for this visit.     Musculoskeletal: Strength & Muscle Tone: within normal limits Gait & Station: normal Patient leans: N/A  Psychiatric Specialty Exam: Review of Systems  Psychiatric/Behavioral:  Positive for sleep disturbance (improving).     Blood pressure 122/82, pulse 100, temperature 98 F (36.7 C), temperature source Temporal, height 5\' 8"  (1.727 m), weight 253 lb 12.8 oz (115.1 kg), SpO2 91%.Body mass index is 38.59 kg/m.  General Appearance: Casual  Eye Contact:  Fair  Speech:  Normal Rate   Volume:  Normal  Mood:  Euthymic  Affect:  Appropriate  Thought Process:  Goal Directed and Descriptions of Associations: Intact  Orientation:  Full (Time, Place, and Person)  Thought Content: Logical   Suicidal Thoughts:  No  Homicidal Thoughts:  No  Memory:  Immediate;   Fair Recent;   Fair Remote;   Fair  Judgement:  Fair  Insight:  Fair  Psychomotor Activity:  Normal  Concentration:  Concentration: Fair and Attention Span: Fair  Recall:  [employer] of Knowledge: Fair  Language: Fair  Akathisia:  No  Handed:  Right  AIMS (if indicated): done  Assets:  Desire for Improvement Housing Intimacy Social Support Transportation  ADL's:  Intact  Cognition: WNL  Sleep:   improving   Screenings: Geneticist, molecular Office Visit from 03/16/2023 in [facility] Office Visit from 12/14/2022 in [facility] Office Visit from 08/17/2022 in [facility] Office Visit from 04/06/2022 in [facility] Office Visit from 12/16/2021 in [facility]  AIMS Total Score 0 0 0 0 0      GAD-7    Flowsheet Row Office Visit from 03/16/2023 in [facility] Office Visit from 12/14/2022 in [facility] Office Visit from 12/16/2021  in [facility] Office Visit from 06/08/2021 in [facility]  Total GAD-7 Score 0 3 4 0      PHQ2-9    Flowsheet Row Office Visit from 03/16/2023 in [facility] Office Visit from 12/14/2022 in [facility] Clinical Support from 11/16/2022 in [facility] at [facility] Office Visit from 08/20/2022 in [facility]  Primary Care at [facility] Office Visit from 04/06/2022 in [facility]  PHQ-2 Total Score 2 0 0 0 0  PHQ-9 Total Score 4 3 0 0 0      Flowsheet Row Office Visit from 03/16/2023 in [facility] ED from 12/31/2022 in [facility] Emergency Department at [facility] Office Visit from 12/14/2022 in [facility]  C-SSRS RISK CATEGORY No Risk No Risk No Risk        Assessment and Plan: Terius Zasada is a 67 year old African-American male on disability, history of schizophrenia, hypertension, multiple medical problems was evaluated in office today.  Patient is currently stable with regards to his mood symptoms improving with regards to sleep although continues to have pain which is chronic, patient interested in smoking cessation today, discussed plan as noted below.  Plan   Schizophrenia-able Chronic schizophrenia, well-managed with current medication regimen. No recent hallucinations, paranoia, or suicidal ideation. Reports good adherence to medication. -Prolixin 10 mg p.o. daily -Benztropine 0.5 mg p.o. twice daily as needed for side effects of Prolixin. -Continue current medication regimen  Anxiety Disorder-stable Chronic anxiety disorder, well-managed. No recent exacerbations reported. -Hydroxyzine 12.5-25 mg p.o. daily as needed for severe anxiety attacks.  Insomnia-improving Intermittent sleep disturbances. Uses a muscle relaxant or sleep medication but not both simultaneously to avoid excessive sedation. Takes sleep medication 2-3 times a week, sometimes a whole pill, sometimes half. -Trazodone 25-50 mg p.o. nightly as needed - Avoid concurrent use of muscle relaxant and sleep medication  Tobacco Use Disorder-unstable Chronic tobacco use, approximately 1.5 packs per day. Expresses a desire to quit smoking. Discussed risks of varenicline (Chantix),  including depression, suicidal thoughts, and seizures. No history of seizures. Informed about the need for monitoring by self and spouse. - Prescribe varenicline 0.5 mg twice daily - Monitor for side effects, including depression and suicidal thoughts - Educate patient and spouse on potential side effects and the importance of monitoring  Discussed the impact of diet and soda consumption on weight. Suggested trying sparkling water as a healthier alternative. - Encourage reduction of soda intake - Suggest trying sparkling water as a healthier alternative  Chronic Pain Chronic pain, likely related to neck and back issues. Prefers ibuprofen over opioids due to concerns about addiction.  Continue ibuprofen as needed - Discuss pain management options with orthopedic specialist if pain persists  Follow-up - Schedule follow-up appointment in three month  Collaboration of Care: Collaboration of Care: Other patient encouraged to follow up with orthopedic provider/primary care provider for management of pain.  Patient/Guardian was advised Release of Information must be obtained prior to any record release in order to collaborate their care with an outside provider. Patient/Guardian was advised if they have not already done so to contact the registration department to sign all necessary forms in order for [place] to release information regarding their care.   Consent: Patient/Guardian gives verbal consent for treatment and assignment of benefits for services provided during  this visit. Patient/Guardian expressed understanding and agreed to proceed.   This note was generated in part or whole with voice recognition software. Voice recognition is usually quite accurate but there are transcription errors that can and very often do occur. I apologize for any typographical errors that were not detected and corrected.    Jomarie Longs, MD 03/16/2023, 11:31 AM

## 2023-03-27 ENCOUNTER — Other Ambulatory Visit: Payer: Self-pay | Admitting: Family

## 2023-04-03 ENCOUNTER — Other Ambulatory Visit: Payer: Self-pay | Admitting: Family

## 2023-04-25 DIAGNOSIS — I739 Peripheral vascular disease, unspecified: Secondary | ICD-10-CM | POA: Diagnosis not present

## 2023-04-25 DIAGNOSIS — M79671 Pain in right foot: Secondary | ICD-10-CM | POA: Diagnosis not present

## 2023-04-25 DIAGNOSIS — M79672 Pain in left foot: Secondary | ICD-10-CM | POA: Diagnosis not present

## 2023-04-25 DIAGNOSIS — L600 Ingrowing nail: Secondary | ICD-10-CM | POA: Diagnosis not present

## 2023-05-03 ENCOUNTER — Ambulatory Visit: Payer: 59 | Admitting: Family

## 2023-05-03 DIAGNOSIS — M545 Low back pain, unspecified: Secondary | ICD-10-CM | POA: Diagnosis not present

## 2023-05-11 DIAGNOSIS — M545 Low back pain, unspecified: Secondary | ICD-10-CM | POA: Diagnosis not present

## 2023-05-15 ENCOUNTER — Other Ambulatory Visit: Payer: Self-pay | Admitting: Family

## 2023-05-16 ENCOUNTER — Telehealth: Payer: Self-pay

## 2023-05-16 DIAGNOSIS — F203 Undifferentiated schizophrenia: Secondary | ICD-10-CM

## 2023-05-16 DIAGNOSIS — F418 Other specified anxiety disorders: Secondary | ICD-10-CM

## 2023-05-16 MED ORDER — FLUPHENAZINE HCL 10 MG PO TABS: 10.0000 mg | ORAL_TABLET | Freq: Every day | ORAL | 1 refills | Status: DC

## 2023-05-16 NOTE — Telephone Encounter (Signed)
Pt.notified

## 2023-05-16 NOTE — Telephone Encounter (Signed)
received fax requesting a refill on the fluphenazine 10mg . pt was last seen on 11-27 next appt 2-27

## 2023-05-16 NOTE — Telephone Encounter (Signed)
I have sent fluphenazine 10 mg to pharmacy

## 2023-05-28 ENCOUNTER — Emergency Department (HOSPITAL_BASED_OUTPATIENT_CLINIC_OR_DEPARTMENT_OTHER)
Admission: EM | Admit: 2023-05-28 | Discharge: 2023-05-28 | Disposition: A | Discharge status: Home / Self Care | Payer: 59 | Attending: Emergency Medicine | Admitting: Emergency Medicine

## 2023-05-28 ENCOUNTER — Other Ambulatory Visit: Payer: Self-pay

## 2023-05-28 ENCOUNTER — Emergency Department (HOSPITAL_BASED_OUTPATIENT_CLINIC_OR_DEPARTMENT_OTHER): Payer: 59

## 2023-05-28 DIAGNOSIS — Z8546 Personal history of malignant neoplasm of prostate: Secondary | ICD-10-CM | POA: Insufficient documentation

## 2023-05-28 DIAGNOSIS — I700 Atherosclerosis of aorta: Secondary | ICD-10-CM | POA: Diagnosis not present

## 2023-05-28 DIAGNOSIS — I6523 Occlusion and stenosis of bilateral carotid arteries: Secondary | ICD-10-CM | POA: Diagnosis not present

## 2023-05-28 DIAGNOSIS — R220 Localized swelling, mass and lump, head: Secondary | ICD-10-CM | POA: Diagnosis not present

## 2023-05-28 DIAGNOSIS — N183 Chronic kidney disease, stage 3 unspecified: Secondary | ICD-10-CM | POA: Diagnosis not present

## 2023-05-28 DIAGNOSIS — J449 Chronic obstructive pulmonary disease, unspecified: Secondary | ICD-10-CM | POA: Diagnosis not present

## 2023-05-28 DIAGNOSIS — I129 Hypertensive chronic kidney disease with stage 1 through stage 4 chronic kidney disease, or unspecified chronic kidney disease: Secondary | ICD-10-CM | POA: Insufficient documentation

## 2023-05-28 DIAGNOSIS — R1310 Dysphagia, unspecified: Secondary | ICD-10-CM | POA: Diagnosis not present

## 2023-05-28 LAB — CBC
HCT: 42.4 % (ref 39.0–52.0)
Hemoglobin: 14.3 g/dL (ref 13.0–17.0)
MCH: 29.1 pg (ref 26.0–34.0)
MCHC: 33.7 g/dL (ref 30.0–36.0)
MCV: 86.4 fL (ref 80.0–100.0)
Platelets: 303 10*3/uL (ref 150–400)
RBC: 4.91 MIL/uL (ref 4.22–5.81)
RDW: 15.6 % — ABNORMAL HIGH (ref 11.5–15.5)
WBC: 9.7 10*3/uL (ref 4.0–10.5)
nRBC: 0 % (ref 0.0–0.2)

## 2023-05-28 LAB — BASIC METABOLIC PANEL
Anion gap: 9 (ref 5–15)
BUN: 9 mg/dL (ref 8–23)
CO2: 27 mmol/L (ref 22–32)
Calcium: 9.3 mg/dL (ref 8.9–10.3)
Chloride: 103 mmol/L (ref 98–111)
Creatinine, Ser: 1.34 mg/dL — ABNORMAL HIGH (ref 0.61–1.24)
GFR, Estimated: 58 mL/min — ABNORMAL LOW (ref >60)
Glucose, Bld: 125 mg/dL — ABNORMAL HIGH (ref 70–99)
Potassium: 3.8 mmol/L (ref 3.5–5.1)
Sodium: 139 mmol/L (ref 135–145)

## 2023-05-28 MED ORDER — PREDNISONE 20 MG PO TABS: 40.0000 mg | ORAL_TABLET | Freq: Every day | ORAL | 0 refills | Status: AC

## 2023-05-28 MED ORDER — EPINEPHRINE 0.3 MG/0.3ML IJ SOAJ: 0.3000 mg | INTRAMUSCULAR | 1 refills | Status: AC | PRN

## 2023-05-28 MED ORDER — DIPHENHYDRAMINE HCL 50 MG/ML IJ SOLN
25.0000 mg | Freq: Once | INTRAMUSCULAR | Status: AC
  Administered 2023-05-28: 25 mg via INTRAVENOUS
  Filled 2023-05-28: qty 1

## 2023-05-28 MED ORDER — METHYLPREDNISOLONE SODIUM SUCC 125 MG IJ SOLR
125.0000 mg | Freq: Once | INTRAMUSCULAR | Status: AC
  Administered 2023-05-28: 125 mg via INTRAVENOUS
  Filled 2023-05-28: qty 2

## 2023-05-28 MED ORDER — EPINEPHRINE 0.3 MG/0.3ML IJ SOAJ
0.3000 mg | Freq: Once | INTRAMUSCULAR | Status: AC
  Administered 2023-05-28: 0.3 mg via INTRAMUSCULAR
  Filled 2023-05-28: qty 0.3

## 2023-05-28 MED ORDER — FAMOTIDINE IN NACL 20-0.9 MG/50ML-% IV SOLN
20.0000 mg | Freq: Once | INTRAVENOUS | Status: AC
  Administered 2023-05-28: 20 mg via INTRAVENOUS
  Filled 2023-05-28: qty 50

## 2023-05-28 NOTE — ED Provider Notes (Signed)
 [facility] Emergency Department Provider Note MRN:  [id number]  Arrival date & time: 05/28/23     Chief Complaint   Oral Swelling   History of Present Illness   [name] is a 68 y.o. year-old male with a history of CKD, COPD presenting to the ED with chief complaint of oral swelling.  Swelling of the tongue for the past 2 days, feels really big, not getting better, feels like his throat is swollen as well, feels like his voice is changed.  Denies rash, no nausea vomiting, no new medications.  Review of Systems  A thorough review of systems was obtained and all systems are negative except as noted in the HPI and PMH.   Patient's Health History    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 Lily   COLONOSCOPY  04/23/2010   normal   COLONOSCOPY WITH PROPOFOL   01/22/2021   poor prep[   ESOPHAGOGASTRODUODENOSCOPY N/A 12/31/2013   Procedure:  ESOPHAGOGASTRODUODENOSCOPY (EGD);  Surgeon: Lupita FORBES Commander, MD;  Location: [facility];  Service: Endoscopy;  Laterality: N/A;   Excision of scalp lesion  07/18/2009   Excision of thyroid  mass     GANGLION CYST EXCISION     right foot x 2   HERNIA REPAIR     ventral   INCISIONAL HERNIA REPAIR  12/01/2011   Procedure: LAPAROSCOPIC INCISIONAL HERNIA;  Surgeon: Vicenta DELENA Poli, MD;  Location: [facility];  Service: General;  Laterality: N/A;   SHOULDER SURGERY     right   UPPER GASTROINTESTINAL ENDOSCOPY      Family History  Problem Relation Age of Onset   Hypertension Mother    Bone cancer Mother    Hypertension Father    Kidney disease Father    Prostate cancer Brother    Bone cancer Brother    Kidney disease Brother    Mental illness Maternal Grandmother    Mental illness Son    Prostate cancer Other    Colon cancer Neg Hx    Colon polyps Neg Hx    Esophageal cancer Neg Hx    Rectal cancer Neg Hx    Stomach cancer Neg Hx     Social History   Socioeconomic History   Marital status: Married    Spouse name: Doyal   Number of children: 2   Years of education: Not on file   Highest education level: 11th grade  Occupational History  Occupation: Disabled    Employer: DISABLED  Tobacco Use   Smoking status: Every Day    Current packs/day: 1.50    Average packs/day: 1.5 packs/day for 54.1 years (81.2 ttl pk-yrs)    Types: Cigarettes    Start date: 1971    Passive exposure: Never   Smokeless tobacco: Never   Tobacco comments:    Started smoking at age 87,still smoking pack - 1-1/2 packs  per day 01/08/2021  Vaping Use   Vaping status: Never Used  Substance and Sexual Activity   Alcohol use: No   Drug use: Yes    Frequency: 1.0 times per week    Types: Marijuana    Comment: last used 10/23/21   Sexual activity: Yes    Partners: Female  Other Topics Concern   Not on file  Social History Narrative   HSG disabled due to schizophrenia - 1983. stable long-term marriage  with children. cared for his father-in-law- passed away in 04-15-24   Social Drivers of Health   Financial Resource Strain: Low Risk  (11/16/2022)   Overall Financial Resource Strain (CARDIA)    Difficulty of Paying Living Expenses: Not hard at all  Food Insecurity: No Food Insecurity (11/16/2022)   Hunger Vital Sign    Worried About Running Out of Food in the Last Year: Never true    Ran Out of Food in the Last Year: Never true  Transportation Needs: No Transportation Needs (11/16/2022)   PRAPARE - Administrator, Civil Service (Medical): No    Lack of Transportation (Non-Medical): No  Physical Activity: Inactive (11/16/2022)   Exercise Vital Sign    Days of Exercise per Week: 0 days    Minutes of Exercise per Session: 0 min  Stress: No Stress Concern Present (11/16/2022)   [employer] of Occupational Health - Occupational Stress Questionnaire    Feeling of Stress : Only a little  Social Connections: Moderately Isolated (11/16/2022)   Social Connection and Isolation Panel [NHANES]    Frequency of Communication with Friends and Family: More than three times a week    Frequency of Social Gatherings with Friends and Family: Twice a week    Attends Religious Services: Never    Database Administrator or Organizations: No    Attends Banker Meetings: Never    Marital Status: Married  Catering Manager Violence: Not At Risk (11/16/2022)   Humiliation, Afraid, Rape, and Kick questionnaire    Fear of Current or Ex-Partner: No    Emotionally Abused: No    Physically Abused: No    Sexually Abused: No     Physical Exam   Vitals:   05/28/23 0545 05/28/23 0615  BP: 131/72 138/70  Pulse: 88 87  Resp:    Temp:    SpO2: 90% 98%    CONSTITUTIONAL: Well-appearing, NAD NEURO/PSYCH:  Alert and oriented x 3, no focal deficits EYES:  eyes equal and reactive ENT/NECK:  no LAD, no JVD CARDIO: Tachycardic rate, well-perfused, normal S1 and S2 PULM:  CTAB no wheezing or  rhonchi GI/GU:  non-distended, non-tender MSK/SPINE:  No gross deformities, no edema SKIN:  no rash, atraumatic   *Additional and/or pertinent findings included in MDM below  Diagnostic and Interventional Summary    EKG Interpretation Date/Time:    Ventricular Rate:    PR Interval:    QRS Duration:    QT Interval:    QTC Calculation:   R Axis:      Text Interpretation:  Labs Reviewed  CBC - Abnormal; Notable for the following components:      Result Value   RDW 15.6 (*)    All other components within normal limits  BASIC METABOLIC PANEL - Abnormal; Notable for the following components:   Glucose, Bld 125 (*)    Creatinine, Ser 1.34 (*)    GFR, Estimated 58 (*)    All other components within normal limits    CT Soft Tissue Neck Wo Contrast  Final Result      Medications  EPINEPHrine  (EPI-PEN) injection 0.3 mg (0.3 mg Intramuscular Given 05/28/23 0325)  diphenhydrAMINE  (BENADRYL ) injection 25 mg (25 mg Intravenous Given 05/28/23 0330)  famotidine  (PEPCID ) IVPB 20 mg premix (0 mg Intravenous Stopped 05/28/23 0400)  methylPREDNISolone  sodium succinate (SOLU-MEDROL ) 125 mg/2 mL injection 125 mg (125 mg Intravenous Given 05/28/23 0330)     Procedures  /  Critical Care Procedures  ED Course and Medical Decision Making  Initial Impression and Ddx Concern for possible angioedema, large tongue but no impending airway collapse at this time, symptoms fairly constant for the past 2 days, denies any family history of angioedema, does not take lisinopril, will provide with allergic angioedema treatments, EpiPen  excetra.  Past medical/surgical history that increases complexity of ED encounter: Hypertension, prostate cancer  Interpretation of Diagnostics I personally reviewed the laboratory assessment and my interpretation is as follows: No significant blood count or electrolyte disturbance  CT imaging of the neck is largely unremarkable.  Patient Reassessment and Ultimate  Disposition/Management     On multiple reassessments patient feels a bit better, definitely not getting worse, continues to have normal vital signs with no hypoxia, no stridor, no wheezing, nothing to suggest significant airway concerns.  Unclear if this is true angioedema.  With abundance of caution we will send patient home with EpiPen , prednisone , PCP follow-up, very strict return precautions.  Patient management required discussion with the following services or consulting groups:  None  Complexity of Problems Addressed Acute illness or injury that poses threat of life of bodily function  Additional Data Reviewed and Analyzed Further history obtained from: Further history from spouse/family member  Additional Factors Impacting ED Encounter Risk Prescriptions  Ozell HERO. Theadore, MD [facility] Emergency Medicine [facility] [email or web address]  Final Clinical Impressions(s) / ED Diagnoses     ICD-10-CM   1. Tongue swelling  R22.0       ED Discharge Orders          Ordered    predniSONE  (DELTASONE ) 20 MG tablet  Daily        05/28/23 0628    EPINEPHrine  0.3 mg/0.3 mL IJ SOAJ injection  As needed        05/28/23 9371             Discharge Instructions Discussed with and Provided to Patient:     Discharge Instructions      You were evaluated in the Emergency Department and after careful evaluation, we did not find any emergent condition requiring admission or further testing in the hospital.  Your exam/testing today is overall reassuring.  Symptoms may be due to allergic reaction.  Recommend follow-up with your primary care doctor to discuss your medications and your symptoms.  Use the steroid medication as directed.  Reserve the EpiPen  only for severe worsening of your symptoms.  Please return to the Emergency Department if you experience any worsening of your condition.   Thank you for allowing us  to be a part of your  care.       Theadore Ozell HERO, MD 05/28/23 0630

## 2023-05-28 NOTE — Discharge Instructions (Signed)
 You were evaluated in the Emergency Department and after careful evaluation, we did not find any emergent condition requiring admission or further testing in the hospital.  Your exam/testing today is overall reassuring.  Symptoms may be due to allergic reaction.  Recommend follow-up with your primary care doctor to discuss your medications and your symptoms.  Use the steroid medication as directed.  Reserve the EpiPen  only for severe worsening of your symptoms.  Please return to the Emergency Department if you experience any worsening of your condition.   Thank you for allowing us  to be a part of your care.

## 2023-05-28 NOTE — ED Triage Notes (Signed)
 Pt reports concern for oral swelling (tongue and throat) worsening over the past two days. Sts he is having difficulty swallowing his secretions.

## 2023-05-28 NOTE — ED Notes (Signed)
 Bero seeing pt in triage

## 2023-05-31 ENCOUNTER — Telehealth: Payer: Self-pay | Admitting: Family

## 2023-05-31 ENCOUNTER — Telehealth: Payer: Self-pay

## 2023-05-31 NOTE — Transitions of Care (Post Inpatient/ED Visit) (Signed)
05/31/2023  Name: [name] MRN: [id number] DOB: [birth year]  Today's TOC FU Call Status: Today's TOC FU Call Status:: Successful TOC FU Call Completed TOC FU Call Complete Date: 05/31/23 Patient's Name and Date of Birth confirmed.  Transition Care Management Follow-up Telephone Call Date of Discharge: 05/28/23 Discharge Facility: [facility] Type of Discharge: Emergency Department Reason for ED Visit: Other: (swelling) How have you been since you were released from the hospital?: Better Any questions or concerns?: No  Items Reviewed: Did you receive and understand the discharge instructions provided?: Yes Medications obtained,verified, and reconciled?: Yes (Medications Reviewed) Any new allergies since your discharge?: No Dietary orders reviewed?: Yes Do you have support at home?: Yes People in Home: spouse  Medications Reviewed Today: Medications Reviewed Today     Reviewed by Karena Addison, LPN (Licensed Practical Nurse) on 05/31/23 at 1450  Med List Status: <None>   Medication Order Taking? Sig Documenting Provider Last Dose Status Informant  acetaminophen (TYLENOL) 500 MG tablet [id number] No Take 1,000 mg by mouth every 6 (six) hours as needed for moderate pain.  [provider] Taking Active Spouse/Significant Other  albuterol (PROVENTIL) (2.5 MG/3ML) 0.083% nebulizer solution [id number] No INHALE 3 ML BY NEBULIZATION EVERY 6 HOURS AS NEEDED FOR WHEEZING OR SHORTNESS OF BREATH [place] Scrape Allyne Gee, FNP Taking Active Spouse/Significant Other  albuterol (VENTOLIN HFA) 108 (90 Base) MCG/ACT inhaler [id number] No TAKE 2 PUFFS BY MOUTH EVERY 6 HOURS AS NEEDED FOR WHEEZE OR SHORTNESS OF Kandis Ban, MD Taking Active   amLODipine (NORVASC) 10 MG tablet [id number] No TAKE 1 TABLET BY MOUTH EVERY DAY Olive Bass, FNP Taking Active Spouse/Significant Other  benztropine (COGENTIN) 0.5 MG tablet [id number] No TAKE 1 TABLET (0.5 MG  TOTAL) BY MOUTH 2 (TWO) TIMES DAILY. AS NEEDED FOR SIDE EFFECTS OF Blair Hailey, MD Taking Active   Blood Pressure Monitoring (BLOOD PRESSURE CUFF) MISC [id number] No Use daily as directed to check blood pressure Olive Bass, FNP Taking Active Spouse/Significant Other  celecoxib (CELEBREX) 50 MG capsule [id number] No TAKE 1 CAPSULE (50 MG TOTAL) BY MOUTH 2 (TWO) TIMES DAILY AS NEEDED FOR PAIN. Levert Feinstein, MD Taking Active   Cyanocobalamin (VITAMIN B-12 PO) [id number] No Take 1 tablet by mouth daily. [provider] Taking Active Spouse/Significant Other  cyclobenzaprine (FLEXERIL) 10 MG tablet [id number]  TAKE 1 TABLET BY MOUTH EVERYDAY AT BEDTIME Olive Bass, FNP  Active   diclofenac Sodium (VOLTAREN) 1 % GEL [id number] No Apply 2 g topically 4 (four) times daily as needed (pain). Olive Bass, FNP Taking Active   dicyclomine (BENTYL) 20 MG tablet [id number] No Take 1 tablet (20 mg total) by mouth 2 (two) times daily as needed for spasms. Carroll Sage, PA-C Taking Active Spouse/Significant Other  EPINEPHrine 0.3 mg/0.3 mL IJ SOAJ injection [id number]  Inject 0.3 mg into the muscle as needed for anaphylaxis. Sabas Sous, MD  Active   esomeprazole (NEXIUM) 40 MG capsule [id number] No TAKE 1 CAPSULE BY MOUTH EVERY DAY Olive Bass, FNP Taking Active Spouse/Significant Other  fluPHENAZine (PROLIXIN) 10 MG tablet [id number]  Take 1 tablet (10 mg total) by mouth daily. Jomarie Longs, MD  Active   fluticasone (FLONASE) 50 MCG/ACT nasal spray [id number]  SPRAY 2 SPRAYS INTO EACH NOSTRIL EVERY DAY Olive Bass, FNP  Active   gabapentin (NEURONTIN) 300 MG capsule [id number] No TAKE 1 CAPSULE BY MOUTH TWICE A DAY Dayton Scrape Allyne Gee, FNP Taking Active   KLOR-CON  M20 20 MEQ tablet [id number] No TAKE 1 TABLET BY MOUTH EVERY DAY [place] Scrape Allyne Gee, FNP Taking Active Spouse/Significant Other  predniSONE (DELTASONE) 20 MG tablet [id number]   Take 2 tablets (40 mg total) by mouth daily for 5 days. Sabas Sous, MD  Active   rosuvastatin (CRESTOR) 10 MG tablet [id number] No TAKE 1 TABLET BY MOUTH EVERY DAY Olive Bass, FNP Taking Active Spouse/Significant Other  sildenafil (VIAGRA) 100 MG tablet [id number] No Take 100 mg by mouth daily as needed for erectile dysfunction. [provider] Taking Active Spouse/Significant Other  SYMBICORT 160-4.5 MCG/ACT inhaler [id number] No INHALE 2 PUFFS INTO THE LUNGS IN THE MORNING AND 2 PUFFS AT BEDTIME Baxley, Luanna Cole, MD Taking Active   traMADol (ULTRAM) 50 MG tablet [id number] No Take 1 tablet (50 mg total) by mouth every 6 (six) hours as needed. Cathren Laine, MD Taking Active   traZODone (DESYREL) 50 MG tablet [id number] No TAKE 0.5-1 TABLETS (25-50 MG TOTAL) BY MOUTH AT BEDTIME. FOR SLEEP Jomarie Longs, MD Taking Active   valsartan (DIOVAN) 320 MG tablet [id number] No TAKE 1 TABLET BY MOUTH EVERY DAY [place] Scrape Allyne Gee, FNP Taking Active   varenicline (CHANTIX) 0.5 MG tablet [id number]  Take 1 tablet (0.5 mg total) by mouth 2 (two) times daily. Jomarie Longs, MD  Active   VITAMIN D PO [id number] No Take 1 tablet by mouth daily. [provider] Taking Active Spouse/Significant Other            Home Care and Equipment/Supplies: Were Home Health Services Ordered?: NA Any new equipment or medical supplies ordered?: NA  Functional Questionnaire: Do you need assistance with bathing/showering or dressing?: No Do you need assistance with meal preparation?: No Do you need assistance with eating?: No Do you have difficulty maintaining continence: No Do you need assistance with getting out of bed/getting out of a chair/moving?: No Do you have difficulty managing or taking your medications?: No  Follow up appointments reviewed: PCP Follow-up appointment confirmed?: Yes Date of PCP follow-up appointment?: 06/03/23 Follow[facility]up Provider: [facility]  Follow-up appointment confirmed?: NA Do you need transportation to your follow-up appointment?: No Do you understand care options if your condition(s) worsen?: Yes-patient verbalized understanding    SIGNATURE Karena Addison, LPN [facility] Nurse Health Advisor Direct Dial [phone]

## 2023-05-31 NOTE — Telephone Encounter (Signed)
We received notice from his insurance company with concerns about him taking Benztropine ( from psychiatrist), Cyclobenzaprine ( from me), Dicyclomine, Hydroxyzine. I am going to ask him to speak to his back specialist about options for the Cyclobenzaprine. I am not writing any of the other medications but he should speak make sure those providers are aware as well.

## 2023-06-01 DIAGNOSIS — M545 Low back pain, unspecified: Secondary | ICD-10-CM | POA: Diagnosis not present

## 2023-06-03 ENCOUNTER — Ambulatory Visit (INDEPENDENT_AMBULATORY_CARE_PROVIDER_SITE_OTHER): Payer: 59 | Admitting: Family

## 2023-06-03 ENCOUNTER — Encounter: Payer: Self-pay | Admitting: Family

## 2023-06-03 VITALS — BP 134/82 | HR 84 | Ht 68.0 in | Wt 261.4 lb

## 2023-06-03 DIAGNOSIS — R220 Localized swelling, mass and lump, head: Secondary | ICD-10-CM

## 2023-06-03 MED ORDER — FLUTICASONE PROPIONATE 50 MCG/ACT NA SUSP: 2.0000 | Freq: Every day | NASAL | 3 refills | Status: DC

## 2023-06-03 NOTE — Patient Instructions (Addendum)
We received notice from his insurance company with concerns about him taking Benztropine ( from psychiatrist), Cyclobenzaprine ( from me),  Hydroxyzine. I am going to ask him to speak to his back specialist about options for the Cyclobenzaprine. I am not writing any of the other medications but he should speak make sure those providers are aware as well.    I am going to refer you to ENT to further evaluate the size of your tongue

## 2023-06-03 NOTE — Progress Notes (Signed)
[name] is a 68 y.o. male with the following history as recorded in [employer]:  Patient Active Problem List   Diagnosis Date Noted   Insomnia 12/14/2022   Carpal tunnel syndrome of right wrist 10/29/2022   Ulnar neuropathy of right upper extremity 10/22/2022   Paresthesia 08/26/2021   Ulnar neuropathy at elbow, left 08/26/2021   Gait abnormality 07/14/2021   Chronic bilateral low back pain with bilateral sciatica 07/14/2021   Neck pain 07/14/2021   Left hand weakness 07/14/2021   Trigger finger, right middle finger 06/02/2021   Coronary artery calcification 08/15/2020   Nodule of apex of right lung 05/29/2020   Malignant neoplasm of prostate (HCC) 10/26/2019   Body mass index (BMI) 25.0-25.9, adult 10/25/2019   Spondylosis of cervical region without myelopathy or radiculopathy 09/26/2019   Numbness of hand 06/22/2019   Ulnar neuropathy at elbow of left upper extremity 06/05/2019   Chronic obstructive pulmonary disease (HCC) 10/24/2018   Cervical radiculopathy at C8 01/18/2018   Right lateral epicondylitis 12/21/2017   Pain in joint, shoulder region 04/23/2015   Periumbilical abdominal pain 02/13/2015   Elevated PSA 08/04/2014   Subacromial bursitis 04/11/2014   Neck pain on right side 03/05/2014   Bilateral shoulder pain 03/05/2014   Recurrent boils 03/05/2014   Foreign body in stomach 12/31/2013   Reflux esophagitis 12/31/2013   Weight loss 05/06/2012   Incisional hernia 11/23/2011   Cramp of limb 01/13/2011   Hyperthyroidism 08/11/2010   Sebaceous cyst 08/10/2010   HYPERTROPHY PROSTATE W/UR OBST & OTH LUTS 02/27/2010   Tobacco use disorder 08/05/2009   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      albuterol (PROVENTIL) (2.5 MG/3ML) 0.083% nebulizer solution INHALE 3 ML BY NEBULIZATION EVERY 6 HOURS AS NEEDED FOR WHEEZING OR SHORTNESS OF BREATH 150 mL 0   albuterol (VENTOLIN HFA) 108 (90 Base) MCG/ACT inhaler TAKE 2 PUFFS BY MOUTH EVERY 6 HOURS AS NEEDED FOR WHEEZE OR SHORTNESS OF BREATH 8.5 each 6   amLODipine (NORVASC) 10 MG tablet TAKE 1 TABLET BY MOUTH EVERY DAY 90 tablet 3   benztropine (COGENTIN) 0.5 MG tablet TAKE 1 TABLET (0.5 MG TOTAL) BY MOUTH 2 (TWO) TIMES DAILY. AS NEEDED FOR SIDE EFFECTS OF PROLIXIN 180 tablet 1   Blood Pressure Monitoring (BLOOD PRESSURE CUFF) MISC Use daily as directed to check blood pressure 1 each 0   celecoxib (CELEBREX) 50 MG capsule TAKE 1 CAPSULE (50 MG TOTAL) BY MOUTH 2 (TWO) TIMES DAILY AS NEEDED FOR PAIN. 60 capsule 6   Cyanocobalamin (VITAMIN B-12 PO) Take 1 tablet by mouth daily.     cyclobenzaprine (FLEXERIL) 10 MG tablet TAKE 1 TABLET BY MOUTH EVERYDAY AT BEDTIME 30 tablet 3   diclofenac Sodium (VOLTAREN) 1 % GEL Apply 2 g topically 4 (four) times daily as needed (pain). 150 g 0   EPINEPHrine 0.3 mg/0.3 mL IJ SOAJ injection Inject 0.3 mg into the muscle as needed for anaphylaxis. 1 each 1  esomeprazole (NEXIUM) 40 MG capsule TAKE 1 CAPSULE BY MOUTH EVERY DAY 90 capsule 3   fluPHENAZine (PROLIXIN) 10 MG tablet Take 1 tablet (10 mg total) by mouth daily. 90 tablet 1   gabapentin (NEURONTIN) 300 MG capsule TAKE 1 CAPSULE BY MOUTH TWICE A DAY 180 capsule 3   KLOR-CON M20 20 MEQ tablet TAKE 1 TABLET BY MOUTH EVERY DAY 90 tablet 3   rosuvastatin (CRESTOR) 10 MG tablet TAKE 1 TABLET BY MOUTH EVERY DAY 90 tablet 3   sildenafil (VIAGRA) 100 MG tablet Take 100 mg by  mouth daily as needed for erectile dysfunction.     SYMBICORT 160-4.5 MCG/ACT inhaler INHALE 2 PUFFS INTO THE LUNGS IN THE MORNING AND 2 PUFFS AT BEDTIME 30.6 each 1   traMADol (ULTRAM) 50 MG tablet Take 1 tablet (50 mg total) by mouth every 6 (six) hours as needed. 15 tablet 0   traZODone (DESYREL) 50 MG tablet TAKE 0.5-1 TABLETS (25-50 MG TOTAL) BY MOUTH AT BEDTIME. FOR SLEEP 90 tablet 3   valsartan (DIOVAN) 320 MG tablet TAKE 1 TABLET BY MOUTH EVERY DAY 90 tablet 1   varenicline (CHANTIX) 0.5 MG tablet Take 1 tablet (0.5 mg total) by mouth 2 (two) times daily. 60 tablet 3   VITAMIN D PO Take 1 tablet by mouth daily.     fluticasone (FLONASE) 50 MCG/ACT nasal spray Place 2 sprays into both nostrils daily. 48 mL 3   hydrOXYzine (VISTARIL) 25 MG capsule Take 25 mg by mouth every 8 (eight) hours as needed for anxiety.     No current facility-administered medications for this visit.    Allergies: Iohexol, Ivp dye [iodinated contrast media], Metoclopramide hcl, Ritalin [methylphenidate], and Wellbutrin [bupropion]  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 Abbey Chatters   COLONOSCOPY  04/23/2010   normal   COLONOSCOPY WITH PROPOFOL  01/22/2021   poor prep[   ESOPHAGOGASTRODUODENOSCOPY N/A 12/31/2013   Procedure: ESOPHAGOGASTRODUODENOSCOPY (EGD);  Surgeon: Iva Boop, MD;  Location: [facility];  Service: Endoscopy;  Laterality: N/A;   Excision of scalp lesion  07/18/2009   Excision of thyroid mass     GANGLION CYST EXCISION     right foot x 2   HERNIA REPAIR     ventral   INCISIONAL HERNIA REPAIR  12/01/2011   Procedure: LAPAROSCOPIC INCISIONAL HERNIA;  Surgeon: Shelly Rubenstein, MD;  Location: [facility];  Service: General;  Laterality: N/A;  SHOULDER SURGERY     right   UPPER GASTROINTESTINAL ENDOSCOPY      Family History  Problem Relation Age of Onset   Hypertension Mother    Bone cancer Mother    Hypertension Father    Kidney disease Father    Prostate cancer Brother    Bone cancer Brother    Kidney disease Brother    Mental illness Maternal Grandmother    Mental illness Son    Prostate cancer Other    Colon cancer Neg Hx    Colon polyps Neg Hx    Esophageal cancer Neg Hx    Rectal cancer Neg Hx    Stomach cancer Neg Hx     Social History   Tobacco Use   Smoking status: Every Day    Current packs/day: 1.50    Average packs/day: 1.5 packs/day for 54.1 years (81.2 ttl pk-yrs)    Types: Cigarettes    Start date: 1971    Passive exposure: Never   Smokeless tobacco: Never   Tobacco comments:    Started smoking at age 35,still smoking pack - 1-1/2 packs  per day 01/08/2021  Substance Use Topics   Alcohol use: No    Subjective:   Accompanied by wife; seen at ER on 05/28/23 with concerns for oral swelling; per ER note, felt that his tongue was "really big" and throat was swollen;  Has responded very well to prednisone; Per patient he was taken increased amount of  Hydroxyzine prior to onset of symptoms- per patient, psychiatrist recommended taking small amount of medication occasionally and he was taking full dosage of medication for 2 days prior to onset of symptoms; also had not been taking his Cogentin regularly either in 2 days prior of symptoms; he notes that in the past he had similar reaction with his tongue on Haldol and Cogentin immediately solved the problem;     Objective:  Vitals:   06/03/23 1419  BP: 134/82  Pulse: 84  SpO2: 100%  Weight: 261 lb 6.4 oz (118.6 kg)  Height: 5\' 8"  (1.727 m)    General: Well developed, well nourished, in no acute distress  Skin : Warm and dry.  Head: Normocephalic and atraumatic  Eyes: Sclera and conjunctiva clear; pupils round and reactive to light; extraocular movements intact  Ears: External normal; canals clear; tympanic membranes normal  Oropharynx: Pink, supple. No suspicious lesions; tongue does appear larger than baseline/ airway patent  Neck: Supple without thyromegaly, adenopathy  Lungs: Respirations unlabored; clear to auscultation bilaterally without wheeze, rales, rhonchi  CVS exam: normal rate and regular rhythm.  Neurologic: Alert and oriented; speech intact; face symmetrical; moves all extremities well; CNII-XII intact without focal deficit   Assessment:  1. Tongue swelling     Plan:  ? Etiology; patient feels it may have been due to not taking his Cogentin as prescribed; physical exam is concerning for change- refer to ENT for further evaluation; stressed again need to quit smoking;   No follow-ups on file.  Orders Placed This Encounter  Procedures   Ambulatory referral to ENT    Referral Priority:   Routine    Referral Type:   Consultation    Referral Reason:   Specialty Services Required    Requested Specialty:   Otolaryngology    Number of Visits Requested:   1    Requested Prescriptions   Signed Prescriptions Disp Refills   fluticasone (FLONASE) 50 MCG/ACT nasal spray 48  mL 3    Sig: Place  2 sprays into both nostrils daily.

## 2023-06-03 NOTE — Telephone Encounter (Signed)
Pt has appointment with PCP today 06/03/2023.

## 2023-06-06 ENCOUNTER — Telehealth: Payer: Self-pay | Admitting: Otolaryngology

## 2023-06-06 NOTE — Telephone Encounter (Signed)
Called and left a VM, Dr. Irene Pap has to reschedule from 2-24 to another date, I have placed him on 3-10 @ 1p, if he can come.

## 2023-06-08 ENCOUNTER — Other Ambulatory Visit: Payer: Self-pay | Admitting: Family

## 2023-06-08 DIAGNOSIS — M48061 Spinal stenosis, lumbar region without neurogenic claudication: Secondary | ICD-10-CM | POA: Insufficient documentation

## 2023-06-08 DIAGNOSIS — M5416 Radiculopathy, lumbar region: Secondary | ICD-10-CM | POA: Diagnosis not present

## 2023-06-08 DIAGNOSIS — M48062 Spinal stenosis, lumbar region with neurogenic claudication: Secondary | ICD-10-CM | POA: Diagnosis not present

## 2023-06-13 ENCOUNTER — Institutional Professional Consult (permissible substitution) (INDEPENDENT_AMBULATORY_CARE_PROVIDER_SITE_OTHER): Payer: 59 | Admitting: Otolaryngology

## 2023-06-16 ENCOUNTER — Encounter: Payer: Self-pay | Admitting: Psychiatry

## 2023-06-16 ENCOUNTER — Ambulatory Visit: Payer: 59 | Admitting: Psychiatry

## 2023-06-16 VITALS — BP 138/82 | HR 55 | Temp 98.6°F | Ht 68.0 in | Wt 257.4 lb

## 2023-06-16 DIAGNOSIS — F203 Undifferentiated schizophrenia: Secondary | ICD-10-CM

## 2023-06-16 DIAGNOSIS — G4701 Insomnia due to medical condition: Secondary | ICD-10-CM

## 2023-06-16 DIAGNOSIS — F418 Other specified anxiety disorders: Secondary | ICD-10-CM

## 2023-06-16 DIAGNOSIS — F17200 Nicotine dependence, unspecified, uncomplicated: Secondary | ICD-10-CM

## 2023-06-16 MED ORDER — BENZTROPINE MESYLATE 0.5 MG PO TABS: 0.5000 mg | ORAL_TABLET | Freq: Two times a day (BID) | ORAL | 1 refills | Status: DC

## 2023-06-16 NOTE — Progress Notes (Signed)
 [facility] MD OP Progress Note  06/16/2023 11:41 AM Shahab Polhamus  MRN:  [id number]  Chief Complaint:  Chief Complaint  Patient presents with   Follow-up   Depression   Medication Refill   HPI: [name] is a 68 year old African-American male on disability, lives in [place] with his wife, has a history of schizophrenia, tobacco use disorder, essential hypertension, coronary artery disease, prostate cancer, chronic pain, history of multiple surgeries was evaluated in office today.  He experiences ongoing pain due to chronic spinal stenosis and is apprehensive about potential surgery. He manages his pain with tramadol and has previously received a five-day course of prednisone, which provided some relief.  Recently followed up with [facility].  MRI showed lumbar stenosis.  He has a history of schizophrenia and is currently stable on a medication regimen that includes Prolixin and Cogentin. He takes trazodone 25 to 50 mg at bedtime for sleep.  Denies auditory or visual hallucinations.  He reports a swollen tongue, which was noted by his medical doctor, and he is scheduled to see an ear, nose, and throat specialist next week to investigate further.  He has a history of tobacco use disorder and previously attempted smoking cessation with Chantix, which he discontinued because it increased his smoking. He is not currently taking any new medications from the psychiatric provider.  He has prostate cancer, which causes him to wake up two to three times a night to urinate, but he is able to fall back asleep. He is taking Flomax for bladder management.  He appeared to be alert, oriented to person place time and situation.  Denies any other concerns today.  Visit Diagnosis:    ICD-10-CM   1. Undifferentiated schizophrenia (HCC)  F20.3 benztropine (COGENTIN) 0.5 MG tablet    2. Other specified anxiety disorders  F41.8    Generalized anxiety not occurring more days than not    3. Insomnia due  to medical condition  G47.01    Pain, mood    4. Tobacco use disorder  F17.200       Past Psychiatric History: I have reviewed past psychiatric history from progress note on 06/08/2021.  Multiple medication trials in the past including Navane, Prolixin.  Multiple inpatient behavioral health admissions in the past.  Past Medical History:  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 Abbey Chatters   COLONOSCOPY  04/23/2010   normal   COLONOSCOPY WITH PROPOFOL  01/22/2021  poor prep[   ESOPHAGOGASTRODUODENOSCOPY N/A 12/31/2013   Procedure: ESOPHAGOGASTRODUODENOSCOPY (EGD);  Surgeon: Iva Boop, MD;  Location: [facility];  Service: Endoscopy;  Laterality: N/A;   Excision of scalp lesion  07/18/2009   Excision of thyroid mass     GANGLION CYST EXCISION     right foot x 2   HERNIA REPAIR     ventral   INCISIONAL HERNIA REPAIR  12/01/2011   Procedure: LAPAROSCOPIC INCISIONAL HERNIA;  Surgeon: Shelly Rubenstein, MD;  Location: [facility];  Service: General;  Laterality: N/A;   SHOULDER SURGERY     right   UPPER GASTROINTESTINAL ENDOSCOPY      Family Psychiatric History: I have reviewed family psychiatric history from progress note on 06/08/2021.  Family History:  Family History  Problem Relation Age of Onset   Hypertension Mother    Bone cancer Mother    Hypertension Father    Kidney disease Father    Prostate cancer Brother    Bone cancer Brother    Kidney disease Brother    Mental illness Maternal Grandmother    Mental illness Son    Prostate cancer Other    Colon cancer Neg Hx    Colon polyps Neg Hx    Esophageal cancer Neg Hx    Rectal cancer Neg Hx    Stomach cancer Neg Hx     Social History: I have reviewed social history from progress note on 06/08/2021. Social History   Socioeconomic History   Marital status: Married    Spouse name: [name]   Number of children: 2   Years of education: Not on file   Highest education level: 11th grade  Occupational History   Occupation: Disabled    Employer: DISABLED  Tobacco Use   Smoking status: Every Day    Current packs/day: 1.50    Average packs/day: 1.5 packs/day for 54.2 years (81.2 ttl pk-yrs)    Types: Cigarettes    Start date: 1971    Passive exposure: Never   Smokeless tobacco: Never   Tobacco comments:    Started smoking at age 64,still smoking pack - 1-1/2 packs  per day 01/08/2021  Vaping Use   Vaping status: Never Used  Substance and Sexual Activity   Alcohol use: No   Drug use: Yes    Frequency: 1.0 times per week    Types: Marijuana    Comment: last used 10/23/21   Sexual activity: Yes    Partners: Female  Other Topics Concern   Not on file  Social History Narrative   HSG disabled due to schizophrenia - 1983. stable long-term marriage with children. cared for his father-in-law- passed away in 2023-07-03   Social Drivers of Health   Financial Resource Strain: Low Risk  (11/16/2022)   Overall Financial Resource  Strain (CARDIA)    Difficulty of Paying Living Expenses: Not hard at all  Food Insecurity: No Food Insecurity (11/16/2022)   Hunger Vital Sign    Worried About Running Out of Food in the Last Year: Never true    Ran Out of Food in the Last Year: Never true  Transportation Needs: No Transportation Needs (11/16/2022)   PRAPARE - Administrator, Civil Service (Medical): No    Lack of Transportation (Non-Medical): No  Physical Activity: Inactive (11/16/2022)   Exercise Vital Sign    Days of Exercise per Week: 0 days    Minutes of Exercise per Session: 0 min  Stress: No Stress Concern Present (11/16/2022)  [employer] of Occupational Health - Occupational Stress Questionnaire    Feeling of Stress : Only a little  Social Connections: Moderately Isolated (11/16/2022)   Social Connection and Isolation Panel [NHANES]    Frequency of Communication with Friends and Family: More than three times a week    Frequency of Social Gatherings with Friends and Family: Twice a week    Attends Religious Services: Never    Database administrator or Organizations: No    Attends Banker Meetings: Never    Marital Status: Married    Allergies:  Allergies  Allergen Reactions   Iohexol Other (See Comments)    Affects nerves: triggers schizophrenia   Ivp Dye [Iodinated Contrast Media]      triggers schizophrenia   Metoclopramide Hcl Other (See Comments)    Affects nerves: triggers schizophrenia   Ritalin [Methylphenidate]      triggers schizophrenia   Wellbutrin [Bupropion] Other (See Comments)    Makes pt smoke heavily    Metabolic Disorder Labs: Lab Results  Component Value Date   HGBA1C 5.9 (H) 08/20/2022   MPG 123 08/20/2022   No results found for: "PROLACTIN" Lab Results  Component Value Date   CHOL 214 (H) 11/19/2021   TRIG 234.0 (H) 11/19/2021   HDL 38.60 (L) 11/19/2021   CHOLHDL 6 11/19/2021   VLDL 46.8 (H) 11/19/2021   LDLCALC 99 10/24/2018   LDLCALC 110  (H) 10/19/2017   Lab Results  Component Value Date   TSH 1.98 08/20/2022   TSH 1.73 05/21/2021    Therapeutic Level Labs: No results found for: "LITHIUM" No results found for: "VALPROATE" No results found for: "CBMZ"  Current Medications: Current Outpatient Medications  Medication Sig Dispense Refill   acetaminophen (TYLENOL) 500 MG tablet Take 1,000 mg by mouth every 6 (six) hours as needed for moderate pain.      albuterol (PROVENTIL) (2.5 MG/3ML) 0.083% nebulizer solution INHALE 3 ML BY NEBULIZATION EVERY 6 HOURS AS NEEDED FOR WHEEZING OR SHORTNESS OF BREATH 150 mL 0   albuterol (VENTOLIN HFA) 108 (90 Base) MCG/ACT inhaler TAKE 2 PUFFS BY MOUTH EVERY 6 HOURS AS NEEDED FOR WHEEZE OR SHORTNESS OF BREATH 8.5 each 6   amLODipine (NORVASC) 10 MG tablet TAKE 1 TABLET BY MOUTH EVERY DAY 90 tablet 3   Blood Pressure Monitoring (BLOOD PRESSURE CUFF) MISC Use daily as directed to check blood pressure 1 each 0   celecoxib (CELEBREX) 50 MG capsule TAKE 1 CAPSULE (50 MG TOTAL) BY MOUTH 2 (TWO) TIMES DAILY AS NEEDED FOR PAIN. 60 capsule 6   Cyanocobalamin (VITAMIN B-12 PO) Take 1 tablet by mouth daily.     cyclobenzaprine (FLEXERIL) 10 MG tablet TAKE 1 TABLET BY MOUTH EVERYDAY AT BEDTIME 30 tablet 3   diclofenac Sodium (VOLTAREN) 1 % GEL Apply 2 g topically 4 (four) times daily as needed (pain). 150 g 0   EPINEPHrine 0.3 mg/0.3 mL IJ SOAJ injection Inject 0.3 mg into the muscle as needed for anaphylaxis. 1 each 1   esomeprazole (NEXIUM) 40 MG capsule TAKE 1 CAPSULE BY MOUTH EVERY DAY 90 capsule 3   fluPHENAZine (PROLIXIN) 10 MG tablet Take 1 tablet (10 mg total) by mouth daily. 90 tablet 1   fluticasone (FLONASE) 50 MCG/ACT nasal spray Place 2 sprays into both nostrils daily. 48 mL 3   gabapentin (NEURONTIN) 300 MG capsule TAKE 1 CAPSULE BY MOUTH TWICE A DAY 180 capsule 3   hydrOXYzine (VISTARIL) 25 MG capsule Take 25 mg by  mouth every 8 (eight) hours as needed for anxiety.     KLOR-CON M20 20  MEQ tablet TAKE 1 TABLET BY MOUTH EVERY DAY 90 tablet 3   rosuvastatin (CRESTOR) 10 MG tablet TAKE 1 TABLET BY MOUTH EVERY DAY 90 tablet 3   sildenafil (VIAGRA) 100 MG tablet Take 100 mg by mouth daily as needed for erectile dysfunction.     SYMBICORT 160-4.5 MCG/ACT inhaler INHALE 2 PUFFS INTO THE LUNGS IN THE MORNING AND 2 PUFFS AT BEDTIME 30.6 each 1   tamsulosin (FLOMAX) 0.4 MG CAPS capsule Take 0.8 mg by mouth daily.     traMADol (ULTRAM) 50 MG tablet Take 1 tablet (50 mg total) by mouth every 6 (six) hours as needed. 15 tablet 0   traZODone (DESYREL) 50 MG tablet TAKE 0.5-1 TABLETS (25-50 MG TOTAL) BY MOUTH AT BEDTIME. FOR SLEEP 90 tablet 3   valsartan (DIOVAN) 320 MG tablet Take 1 tablet (320 mg total) by mouth daily. 90 tablet 1   VITAMIN D PO Take 1 tablet by mouth daily.     benztropine (COGENTIN) 0.5 MG tablet Take 1 tablet (0.5 mg total) by mouth 2 (two) times daily. As needed for side effects of Prolixin 180 tablet 1   No current facility-administered medications for this visit.     Musculoskeletal: Strength & Muscle Tone: within normal limits Gait & Station:  slow Patient leans: N/A  Psychiatric Specialty Exam: Review of Systems  Psychiatric/Behavioral:  Positive for sleep disturbance. The patient is nervous/anxious.     Blood pressure 138/82, pulse (!) 55, temperature 98.6 F (37 C), temperature source Temporal, height 5\' 8"  (1.727 m), weight 257 lb 6.4 oz (116.8 kg), SpO2 100%.Body mass index is 39.14 kg/m.  General Appearance: Casual  Eye Contact:  Fair  Speech:  Clear and Coherent  Volume:  Normal  Mood:  Anxious due to current pain  Affect:  Appropriate  Thought Process:  Goal Directed and Descriptions of Associations: Intact  Orientation:  Full (Time, Place, and Person)  Thought Content: Logical   Suicidal Thoughts:  No  Homicidal Thoughts:  No  Memory:  Immediate;   Fair Recent;   Fair Remote;   Fair  Judgement:  Fair  Insight:  Fair  Psychomotor  Activity:  Normal  Concentration:  Concentration: Fair and Attention Span: Fair  Recall:  [employer] of Knowledge: Fair  Language: Fair  Akathisia:  No  Handed:  Right  AIMS (if indicated): done  Assets:  Desire for Improvement Housing Social Support  ADL's:  Intact  Cognition: WNL  Sleep:   varies due to pain   Screenings: AIMS    Flowsheet Row Office Visit from 06/16/2023 in [facility] Office Visit from 03/16/2023 in [facility] Office Visit from 12/14/2022 in [facility] Office Visit from 08/17/2022 in [facility] Office Visit from 04/06/2022 in [facility]  AIMS Total Score 0 0 0 0 0      GAD-7    Flowsheet Row Office Visit from 06/16/2023 in [facility] Office Visit from 03/16/2023 in [facility] Office Visit from 12/14/2022 in [facility] Office Visit from 12/16/2021 in [facility] Office Visit from 06/08/2021 in [facility]  Total GAD-7 Score 2 0 3 4 0  PHQ2-9    Flowsheet Row Office Visit from 06/16/2023 in [facility] Office Visit from 03/16/2023 in [facility] Office Visit from 12/14/2022 in [facility] Clinical Support from 11/16/2022 in [facility] at [facility] Office Visit from 08/20/2022 in [facility] at [facility]  PHQ-2 Total Score 0 2 0 0 0  PHQ-9 Total Score -- 4 3 0 0      Flowsheet Row Office Visit from 06/16/2023 in [facility] Office Visit  from 03/16/2023 in [facility] ED from 12/31/2022 in [facility] Emergency Department at [facility]  C-SSRS RISK CATEGORY No Risk No Risk No Risk        Assessment and Plan: Ramil Edgington is a 68 year old African-American male on disability, history of schizophrenia, hypertension, multiple medical problems was evaluated in office today.  Discussed assessment and plan as noted below.  Schizophrenia-stable Schizophrenia managed with Proleukin and Cogentin. No recent auditory or visual hallucinations. No current medication side effects. Past tongue swelling potentially related to Chantix, but no current issues. - Continue Prolixin 10 mg daily - Continue Benztropine 0.5 mg twice a day as needed for side effects.  Anxiety disorder-stable Chronic anxiety recently exacerbated due to pain.  Currently managing it well. - Hydroxyzine 12.5-25 mg daily as needed for severe anxiety attacks.  Although available he does not use it.  Insomnia-improving Sleep problems mostly due to current pain.  Reports sleep has improved in the past few weeks. - Continue Trazodone 25-50 mg at bedtime as needed. - Advised not to combine multiple CNS depressant medications including gabapentin, muscle relaxants with the trazodone at bedtime.  Tobacco use disorder-unstable Recently tried Chantix and did not help.  He has stopped taking it. - Provided counseling for 1 minute.  Collaboration of Care: Collaboration of Care: Other encouraged to follow up with pain provider for management of pain.  Follow-up - Schedule follow-up appointment in June.   Patient/Guardian was advised Release of Information must be obtained prior to any record release in order to collaborate their care with an outside provider. Patient/Guardian was advised if they have not already done so to contact the registration department to sign all necessary forms in order for [place] to release information  regarding their care.   Consent: Patient/Guardian gives verbal consent for treatment and assignment of benefits for services provided during this visit. Patient/Guardian expressed understanding and agreed to proceed.  Discussed the use of a AI scribe software for clinical note transcription with the patient, who gave verbal consent to proceed.  This note was generated in part or whole with voice recognition software. Voice recognition is usually quite accurate but there are transcription errors that can and very often do occur. I apologize for any typographical errors that were not detected and corrected.    Jomarie Longs, MD 06/16/2023, 11:41 AM

## 2023-06-21 DIAGNOSIS — M5416 Radiculopathy, lumbar region: Secondary | ICD-10-CM | POA: Diagnosis not present

## 2023-06-27 ENCOUNTER — Encounter (INDEPENDENT_AMBULATORY_CARE_PROVIDER_SITE_OTHER): Payer: Self-pay | Admitting: Otolaryngology

## 2023-06-27 ENCOUNTER — Ambulatory Visit (INDEPENDENT_AMBULATORY_CARE_PROVIDER_SITE_OTHER): Payer: 59 | Admitting: Otolaryngology

## 2023-06-27 VITALS — BP 155/83 | HR 110 | Ht 69.0 in | Wt 250.0 lb

## 2023-06-27 DIAGNOSIS — R0982 Postnasal drip: Secondary | ICD-10-CM

## 2023-06-27 DIAGNOSIS — K219 Gastro-esophageal reflux disease without esophagitis: Secondary | ICD-10-CM

## 2023-06-27 DIAGNOSIS — J3089 Other allergic rhinitis: Secondary | ICD-10-CM

## 2023-06-27 DIAGNOSIS — R220 Localized swelling, mass and lump, head: Secondary | ICD-10-CM | POA: Diagnosis not present

## 2023-06-27 DIAGNOSIS — J343 Hypertrophy of nasal turbinates: Secondary | ICD-10-CM

## 2023-06-27 DIAGNOSIS — F17210 Nicotine dependence, cigarettes, uncomplicated: Secondary | ICD-10-CM | POA: Diagnosis not present

## 2023-06-27 DIAGNOSIS — R0981 Nasal congestion: Secondary | ICD-10-CM | POA: Diagnosis not present

## 2023-06-27 DIAGNOSIS — J342 Deviated nasal septum: Secondary | ICD-10-CM

## 2023-06-27 DIAGNOSIS — Z9189 Other specified personal risk factors, not elsewhere classified: Secondary | ICD-10-CM

## 2023-06-27 DIAGNOSIS — F17200 Nicotine dependence, unspecified, uncomplicated: Secondary | ICD-10-CM

## 2023-06-27 MED ORDER — CETIRIZINE HCL 10 MG PO TABS: 10.0000 mg | ORAL_TABLET | Freq: Every day | ORAL | 11 refills | Status: AC

## 2023-06-27 MED ORDER — FLUTICASONE PROPIONATE 50 MCG/ACT NA SUSP: 2.0000 | Freq: Every day | NASAL | 6 refills | Status: AC

## 2023-06-27 NOTE — Progress Notes (Unsigned)
 ENT CONSULT:  Reason for Consult: tongue swelling episode medication allergic reaction   HPI: Discussed the use of AI scribe software for clinical note transcription with the patient, who gave verbal consent to proceed.  History of Present Illness   83 yoM with hx of Schizophrenia, tongue swelling several years ago as a reaction to Haldol, who presents after another recent episode of tongue swelling. He is accompanied by his wife.   He noticed tongue swelling approximately three weeks ago after taking Chantix for smoking cessation. The swelling began after four to five days of medication use and improved after discontinuation. No shortness of breath or significant difficulty swallowing associated with the episode of tongue swelling. Denies lip swelling. He had a CT neck without contrast, which did not reveal any significant findings.  He has a history of a similar reaction to Haldol, which also caused tongue swelling many years ago. Today he states that the tongue swelling is completely resolved and his tongue size feels back  to normal.  He is attempting to quit smoking and acknowledges the challenge. He has been using Chantix for this purpose but discontinued it due to the adverse reaction. He has a history of vocal cord surgery for nodules at age 68.  He has a history of thyroid nodule removal approximately fifteen years ago, which was not cancerous per report. Denies hx of thyroid cancer. His wife reports that his speech is normal and at baseline today, and states that he is here to get his tongue evaluated. He received an epi pen and states he has it at home.    Records Reviewed:  Office visit with PCP Ria Clock 06/03/23 Accompanied by wife; seen at ER on 05/28/23 with concerns for oral swelling; per ER note, felt that his tongue was "really big" and throat was swollen;  Has responded very well to prednisone; Per patient he was taken increased amount of Hydroxyzine prior to onset of  symptoms- per patient, psychiatrist recommended taking small amount of medication occasionally and he was taking full dosage of medication for 2 days prior to onset of symptoms; also had not been taking his Cogentin regularly either in 2 days prior of symptoms; he notes that in the past he had similar reaction with his tongue on Haldol and Cogentin immediately solved the problem;    1. Tongue swelling     Plan:  ? Etiology; patient feels it may have been due to not taking his Cogentin as prescribed; physical exam is concerning for change- refer to ENT for further evaluation; stressed again need to quit smoking;   ED visit 05/28/23 [name] is a 68 y.o. year-old male with a history of CKD, COPD presenting to the ED with chief complaint of oral swelling.   Swelling of the tongue for the past 2 days, feels really big, not getting better, feels like his throat is swollen as well, feels like his voice is changed.  Denies rash, no nausea vomiting, no new medications.Concern for possible angioedema, large tongue but no impending airway collapse at this time, symptoms fairly constant for the past 2 days, denies any family history of angioedema, does not take lisinopril, will provide with allergic angioedema treatments, EpiPen excetra.   Past medical/surgical history that increases complexity of ED encounter: Hypertension, prostate cancer   Interpretation of Diagnostics I personally reviewed the laboratory assessment and my interpretation is as follows: No significant blood count or electrolyte disturbance   CT imaging of the neck is largely unremarkable.  Patient Reassessment and Ultimate Disposition/Management      On multiple reassessments patient feels a bit better, definitely not getting worse, continues to have normal vital signs with no hypoxia, no stridor, no wheezing, nothing to suggest significant airway concerns.  Unclear if this is true angioedema.  With abundance of caution we will  send patient home with EpiPen, prednisone, PCP follow-up, very strict return precautions.     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 Abbey Chatters   COLONOSCOPY  04/23/2010   normal   COLONOSCOPY WITH PROPOFOL  01/22/2021   poor prep[   ESOPHAGOGASTRODUODENOSCOPY N/A 12/31/2013   Procedure: ESOPHAGOGASTRODUODENOSCOPY (EGD);  Surgeon: Iva Boop, MD;  Location: [facility];  Service: Endoscopy;  Laterality: N/A;   Excision of scalp lesion  07/18/2009   Excision of thyroid mass     GANGLION CYST EXCISION     right foot x 2   HERNIA REPAIR     ventral   INCISIONAL HERNIA REPAIR  12/01/2011   Procedure: LAPAROSCOPIC INCISIONAL HERNIA;  Surgeon: Shelly Rubenstein, MD;  Location: [facility] OR;  Service: General;  Laterality: N/A;   SHOULDER SURGERY     right   UPPER GASTROINTESTINAL ENDOSCOPY      Family History  Problem Relation Age of Onset   Hypertension Mother    Bone cancer Mother     Hypertension Father    Kidney disease Father    Prostate cancer Brother    Bone cancer Brother    Kidney disease Brother    Mental illness Maternal Grandmother    Mental illness Son    Prostate cancer Other    Colon cancer Neg Hx    Colon polyps Neg Hx    Esophageal cancer Neg Hx    Rectal cancer Neg Hx    Stomach cancer Neg Hx     Social History:  reports that he has been smoking cigarettes. He started smoking about 54 years ago. He has a 81.3 pack-year smoking history. He has never been exposed to tobacco smoke. He has never used smokeless tobacco. He reports current drug use. Frequency: 1.00 time per week. Drug: Marijuana. He reports that he does not drink alcohol.  Allergies:  Allergies  Allergen Reactions   Iohexol Other (See Comments)    Affects nerves: triggers schizophrenia   Ivp Dye [Iodinated Contrast Media]      triggers schizophrenia   Metoclopramide Hcl Other (See Comments)  Affects nerves: triggers schizophrenia   Ritalin [Methylphenidate]      triggers schizophrenia   Wellbutrin [Bupropion] Other (See Comments)    Makes pt smoke heavily    Medications: I have reviewed the patient's current medications.  The PMH, PSH, Medications, Allergies, and SH were reviewed and updated.  ROS: Constitutional: Negative for fever, weight loss and weight gain. Cardiovascular: Negative for chest pain and dyspnea on exertion. Respiratory: Is not experiencing shortness of breath at rest. Gastrointestinal: Negative for nausea and vomiting. Neurological: Negative for headaches. Psychiatric: The patient is not nervous/anxious  Blood pressure (!) 155/83, pulse (!) 110, height 5\' 9"  (1.753 m), weight 250 lb (113.4 kg), SpO2 95%. Body mass index is 36.92 kg/m.  PHYSICAL EXAM:  Exam: General: Well-developed, well-nourished Communication and Voice: slightly raspy Respiratory Respiratory effort: Equal inspiration and expiration without stridor Cardiovascular Peripheral  Vascular: Warm extremities with equal color/perfusion Eyes: No nystagmus with equal extraocular motion bilaterally Neuro/Psych/Balance: Patient oriented to person, place, and time; Appropriate mood and affect; Gait is intact with no imbalance; Cranial nerves I-XII are intact Head and Face Inspection: Normocephalic and atraumatic without mass or lesion Palpation: Facial skeleton intact without bony stepoffs Salivary Glands: No mass or tenderness Facial Strength: Facial motility symmetric and full bilaterally ENT Pinna: External ear intact and fully developed External canal: Canal is patent with intact skin Tympanic Membrane: Clear and mobile External Nose: No scar or anatomic deformity Internal Nose: Septum is deviated to the left. No polyp, or purulence. Mucosal edema and erythema present.  Bilateral inferior turbinate hypertrophy.  Lips, Teeth, and gums: Mucosa and teeth intact and viable TMJ: No pain to palpation with full mobility Oral cavity/oropharynx: No erythema or exudate, no lesions present Nasopharynx: No mass or lesion with intact mucosa Hypopharynx: Intact mucosa without pooling of secretions Larynx Glottic: Full true vocal cord mobility without lesion or mass mild Reinke's edema no obstruction of the glottic opening no supraglottic edema Supraglottic: Normal appearing epiglottis and AE folds Interarytenoid Space: Moderate pachydermia&edema Subglottic Space: Patent without lesion or edema Neck Neck and Trachea: Midline trachea without mass or lesion Thyroid: No mass or nodularity Lymphatics: No lymphadenopathy  Procedure: Preoperative diagnosis: tongue swelling episode   Postoperative diagnosis:   Same + GERD LPR + Reinke's edema   Procedure: Flexible fiberoptic laryngoscopy  Surgeon: Ashok Croon, MD  Anesthesia: Topical lidocaine and Afrin Complications: None Condition is stable throughout exam  Indications and consent:  The patient presents to the clinic  with above symptoms. Indirect laryngoscopy view was incomplete. Thus it was recommended that they undergo a flexible fiberoptic laryngoscopy. All of the risks, benefits, and potential complications were reviewed with the patient preoperatively and verbal informed consent was obtained.  Procedure: The patient was seated upright in the clinic. Topical lidocaine and Afrin were applied to the nasal cavity. After adequate anesthesia had occurred, I then proceeded to pass the flexible telescope into the nasal cavity. The nasal cavity was patent without rhinorrhea or polyp. The nasopharynx was also patent without mass or lesion. The base of tongue was visualized and was normal. There were no signs of pooling of secretions in the piriform sinuses. The true vocal folds were mobile bilaterally. There were no signs of glottic or supraglottic mucosal lesion or mass. There was moderate interarytenoid pachydermia and post cricoid edema. The telescope was then slowly withdrawn and the patient tolerated the procedure throughout.     PROCEDURE NOTE: nasal endoscopy  Preoperative diagnosis: chronic nasal congestion symptoms  Postoperative diagnosis: same  septal deviation and nasal congestion   Procedure: Diagnostic nasal endoscopy (84696)  Surgeon: Ashok Croon, M.D.  Anesthesia: Topical lidocaine and Afrin  H&P REVIEW: The patient's history and physical were reviewed today prior to procedure. All medications were reviewed and updated as well. Complications: None Condition is stable throughout exam Indications and consent: The patient presents with symptoms of chronic sinusitis not responding to previous therapies. All the risks, benefits, and potential complications were reviewed with the patient preoperatively and informed consent was obtained. The time out was completed with confirmation of the correct procedure.   Procedure: The patient was seated upright in the clinic. Topical lidocaine and Afrin were  applied to the nasal cavity. After adequate anesthesia had occurred, the rigid nasal endoscope was passed into the nasal cavity. The nasal mucosa, turbinates, septum, and sinus drainage pathways were visualized bilaterally. This revealed no purulence or significant secretions that might be cultured. There were no polyps or sites of significant inflammation. The mucosa was intact and there was no crusting present. The scope was then slowly withdrawn and the patient tolerated the procedure well. There were no complications or blood loss.    Studies Reviewed: CT neck non-con 05/28/23 FINDINGS: Pharynx and larynx: Mild motion artifact. When allowing for this the epiglottis remains within normal limits as seen on sagittal image 80.   Symmetric larynx. No obvious laryngeal soft tissue swelling or mass. Grossly normal soft palate.   Symmetric pharyngeal soft tissues aside from chronic postinflammatory calcifications of the left palatine tonsil. No convincing parapharyngeal or retropharyngeal space inflammation. Symmetric adenoid enlargement. No contrast administered.   Salivary glands: Negative noncontrast appearance when allowing for motion.   Thyroid: Small metallic clips surrounding the noncontrast thyroid which otherwise appears negative.   Lymph nodes: No evidence of cervical lymphadenopathy on this noncontrast exam.   Vascular: Vascular patency is not evaluated in the absence of IV contrast. Calcified bilateral carotid bifurcation atherosclerosis.   Limited intracranial: Negative.   Visualized orbits: Negative.   Mastoids and visualized paranasal sinuses: Paranasal sinuses, tympanic cavities and mastoids are well aerated. Mild left maxillary alveolar recess retention cyst.   Skeleton: Absent dentition. Cervical spine degeneration appears stable to the MRI last year. No acute osseous abnormality identified.   Upper chest: Subglottic trachea and carina appear patent,  normal. Mild motion artifact. Upper lungs appear well aerated, clear. Calcified aortic atherosclerosis. No evidence of superior mediastinal mass or lymphadenopathy.   IMPRESSION: 1. Mild motion artifact on this noncontrast exam. 2. Epiglottis and larynx are within normal limits. Possible generalized tonsillar hypertrophy, but no other acute or inflammatory process identified in the Neck. 3.  Aortic Atherosclerosis (ICD10-I70.0).  Assessment/Plan: Encounter Diagnoses  Name Primary?   At risk for allergic reaction to medication    Tongue swelling Yes   Tobacco use disorder    Chronic nasal congestion    Environmental and seasonal allergies    Post-nasal drip    Hypertrophy of both inferior nasal turbinates    Nasal septal deviation     Assessment and Plan    Episode of Tongue swelling reportedly due to Chantix resolved after discontinuation of medication  He experienced tongue swelling approximately three weeks ago, associated with the use of Chantix for smoking cessation. The swelling began after four to five days of use and resolved after discontinuation of the medication. A CT neck scan in ED without contrast (he has allergy) showed no significant findings, patent airway and current examination reveals no tongue swelling including flexible laryngoscopy which  showed no upper airway edema masses or tumors. His tongue is large at baseline, but there was not tongue edema or induration, no lip swelling, and flexible scope exam did not show any airway edema. He did have mild Reinke's edema without any obstruction at the glottic opening and we discussed that this is the cause of his raspy voice and is related to smoking. The swelling is suspected to be an allergic reaction to Chantix, similar to a previous reaction to Haldol. There is a risk of more severe allergic reactions that could result in airway obstruction, hence the importance of having an EpiPen on hand was discussed. - Refer to  allergy specialist for further evaluation of potential allergic reactions to multiple medications and angioedema evaluation - Prescribe Zyrtec and Flonase for nasal congestion - Ensure he has an EpiPen for emergency use in case of severe allergic reactions - he reports he has it at home   Tobacco use d/o He is a smoker and exhibits changes along the vocal cords consistent with smoking-related irritation. Smoking cessation is advised to reduce the risk of further complications, including cancer. Smoking is also contributing to increased mucus production and nasal congestion. Spent 4 min counseling.  - Provided resources for smoking cessation support, including counseling through [facility]  Chronic nasal congestion and post-nasal drainage Evidence of post-nasal drainage during flexible scope exam today, could be contributing to her sx - trial of Zyrtec 10 mg daily and Flonase 2 puffs b/l nares BID - consider nasal saline rinses   Suspected sleep apnea He exhibits symptoms suggestive of sleep apnea, including snoring and breath-holding during sleep per his wife. He has a large tongue, which is a common anatomical marker for sleep apnea. However, he declined further testing for sleep apnea due to concerns about using a CPAP machine. - Discuss with PCP in the future if he requires OSA screening and evaluation  Thyroid nodule hx s/p partial thyroidectomy years ago He had a non-cancerous thyroid nodule removed approximately fifteen years ago. The nodule was located in the middle of the neck, and no further issues have been reported since the surgery. - discuss with PCP if he requires thyroid U/S in the future   Follow-up He requires follow-up for allergy evaluation and smoking cessation support. - Schedule follow-up with allergy specialist as per his preference - Provide after-visit summary with contact information for smoking cessation support      Thank you for allowing me to participate in  the care of this patient. Please do not hesitate to contact me with any questions or concerns.   Ashok Croon, MD Otolaryngology [facility] Phone: [phone] Fax: [phone]    06/28/2023, 8:57 AM

## 2023-06-27 NOTE — Patient Instructions (Signed)
 Dear [name],   Congratulations for your interest in quitting smoking!  Find a program that suits you best: when you want to quit, how you need support, where you live, and how you like to learn.    If you're ready to get started TODAY, consider scheduling a visit through [facility] @ [email or web address].  Appointments are available from 8am to 8pm, Monday to Friday.   Most health insurance plans will cover some level of tobacco cessation visits and medications.    Additional Resources: [employer] are also available to help you quit & provide the support you'll need. Many programs are available in both [place] and Spanish and have a long history of successfully helping people get off and stay off tobacco.    Quit Smoking Apps:  quitSTART at [employer].de QuitGuide?at [employer].dk Online education and resources: Smokefree  at [employer].gov Free Telephone Coaching: QuitNow,  Call 1-800-QUIT-NOW (6077163554) or Text- Ready to (959)626-2398 *Quitline  has teamed up with Medicaid to offer a free 14 week program    Vaping- Want to Quit? Free 24/7 support. Call [facility]  [place], [place], [place], [place], [place]  [facility]

## 2023-07-06 DIAGNOSIS — M25511 Pain in right shoulder: Secondary | ICD-10-CM | POA: Diagnosis not present

## 2023-07-06 DIAGNOSIS — M25512 Pain in left shoulder: Secondary | ICD-10-CM | POA: Diagnosis not present

## 2023-07-06 DIAGNOSIS — M5416 Radiculopathy, lumbar region: Secondary | ICD-10-CM | POA: Diagnosis not present

## 2023-07-21 DIAGNOSIS — H52223 Regular astigmatism, bilateral: Secondary | ICD-10-CM | POA: Diagnosis not present

## 2023-07-21 DIAGNOSIS — H524 Presbyopia: Secondary | ICD-10-CM | POA: Diagnosis not present

## 2023-07-21 DIAGNOSIS — H2513 Age-related nuclear cataract, bilateral: Secondary | ICD-10-CM | POA: Diagnosis not present

## 2023-07-21 DIAGNOSIS — H04123 Dry eye syndrome of bilateral lacrimal glands: Secondary | ICD-10-CM | POA: Diagnosis not present

## 2023-07-25 DIAGNOSIS — I739 Peripheral vascular disease, unspecified: Secondary | ICD-10-CM | POA: Diagnosis not present

## 2023-07-25 DIAGNOSIS — L603 Nail dystrophy: Secondary | ICD-10-CM | POA: Diagnosis not present

## 2023-07-27 ENCOUNTER — Other Ambulatory Visit: Payer: Self-pay | Admitting: Pulmonary Disease

## 2023-08-01 ENCOUNTER — Other Ambulatory Visit: Payer: Self-pay | Admitting: Family

## 2023-08-10 DIAGNOSIS — N1831 Chronic kidney disease, stage 3a: Secondary | ICD-10-CM | POA: Diagnosis not present

## 2023-08-10 DIAGNOSIS — E559 Vitamin D deficiency, unspecified: Secondary | ICD-10-CM | POA: Diagnosis not present

## 2023-08-10 DIAGNOSIS — I129 Hypertensive chronic kidney disease with stage 1 through stage 4 chronic kidney disease, or unspecified chronic kidney disease: Secondary | ICD-10-CM | POA: Diagnosis not present

## 2023-09-05 ENCOUNTER — Other Ambulatory Visit: Payer: Self-pay | Admitting: Family

## 2023-09-13 ENCOUNTER — Encounter: Payer: Self-pay | Admitting: *Deleted

## 2023-09-19 ENCOUNTER — Encounter: Payer: Self-pay | Admitting: Psychiatry

## 2023-09-19 ENCOUNTER — Other Ambulatory Visit: Payer: Self-pay

## 2023-09-19 ENCOUNTER — Ambulatory Visit (INDEPENDENT_AMBULATORY_CARE_PROVIDER_SITE_OTHER): Payer: 59 | Admitting: Psychiatry

## 2023-09-19 VITALS — BP 119/80 | HR 97 | Temp 97.2°F | Ht 69.0 in | Wt 255.0 lb

## 2023-09-19 DIAGNOSIS — F418 Other specified anxiety disorders: Secondary | ICD-10-CM

## 2023-09-19 DIAGNOSIS — F203 Undifferentiated schizophrenia: Secondary | ICD-10-CM | POA: Diagnosis not present

## 2023-09-19 DIAGNOSIS — F17200 Nicotine dependence, unspecified, uncomplicated: Secondary | ICD-10-CM

## 2023-09-19 DIAGNOSIS — G4701 Insomnia due to medical condition: Secondary | ICD-10-CM | POA: Diagnosis not present

## 2023-09-19 MED ORDER — TRAZODONE HCL 100 MG PO TABS: 50.0000 mg | ORAL_TABLET | Freq: Every evening | ORAL | 0 refills | Status: DC | PRN

## 2023-09-19 NOTE — Progress Notes (Signed)
 BH MD OP Progress Note  09/19/2023 9:26 AM [name]  MRN:  [id number]  Chief Complaint:  Chief Complaint  Patient presents with   Follow-up   Depression   Medication Refill   Schizophrenia   Insomnia   Discussed the use of AI scribe software for clinical note transcription with the patient, who gave verbal consent to proceed.  History of Present Illness [name] is a 68 year old African-American male on disability, lives in [name] with [place] wife, has a history of schizophrenia, tobacco use disorder, essential hypertension, coronary artery disease, prostate cancer, chronic pain, history of multiple surgeries was evaluated in office today for a follow-up appointment.  He experiences ongoing insomnia, often staying up until midnight or 1 AM, frequently smoking cigarettes during this time. He does not consistently take his prescribed trazodone  25 to 50 mg at bedtime, which may contribute to his sleep difficulties.  He has a history of schizophrenia.  Currently denies hallucinations, paranoia.denies any significant depression symptoms.  Does have anxiety mostly exacerbated by pain.  He takes Prolixin  (fluphenazine ) 10 mg for schizophrenia and uses hydroxyzine  as needed for anxiety, though not daily.  Chronic pain is present in his back, neck, and shoulder. He manages this with tramadol , gabapentin  300 mg twice a day, and diclofenac  gel.   He has a long history of smoking and finds it difficult to quit. He has previously tried Chantix . Smoking affects his sleep as he stays up late smoking cigarettes.  Denies any current suicidality or homicidality.  Denies any other concerns today.    Visit Diagnosis:    ICD-10-CM   1. Undifferentiated schizophrenia (HCC)  F20.3 traZODone  (DESYREL ) 100 MG tablet    2. Other specified anxiety disorders  F41.8 traZODone  (DESYREL ) 100 MG tablet   Generalized anxiety not occurring more days than not.    3. Insomnia due to medical condition   G47.01 traZODone  (DESYREL ) 100 MG tablet   Pain, mood    4. Tobacco use disorder  F17.200       Past Psychiatric History: I have reviewed past psychiatric history from progress note on 06/08/2021.  Multiple medication trials in the past including Navane , Prolixin .  Multiple inpatient behavioral health admissions in the past.  Past Medical History:  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 Lynnell Sato   COLONOSCOPY  04/23/2010   normal   COLONOSCOPY WITH PROPOFOL   01/22/2021   poor prep[  ESOPHAGOGASTRODUODENOSCOPY N/A 12/31/2013   Procedure: ESOPHAGOGASTRODUODENOSCOPY (EGD);  Surgeon: Kenney Peacemaker, MD;  Location: [facility];  Service: Endoscopy;  Laterality: N/A;   Excision of scalp lesion  07/18/2009   Excision of thyroid  mass     GANGLION CYST EXCISION     right foot x 2   HERNIA REPAIR     ventral   INCISIONAL HERNIA REPAIR  12/01/2011   Procedure: LAPAROSCOPIC INCISIONAL HERNIA;   Surgeon: Rogena Class, MD;  Location: [facility];  Service: General;  Laterality: N/A;   SHOULDER SURGERY     right   UPPER GASTROINTESTINAL ENDOSCOPY      Family Psychiatric History: I have reviewed family psychiatric history from progress note on 06/08/2021.  Family History:  Family History  Problem Relation Age of Onset   Hypertension Mother    Bone cancer Mother    Hypertension Father    Kidney disease Father    Prostate cancer Brother    Bone cancer Brother    Kidney disease Brother    Mental illness Maternal Grandmother    Mental illness Son    Prostate cancer Other    Colon cancer Neg Hx    Colon polyps Neg Hx    Esophageal cancer Neg Hx    Rectal cancer Neg Hx    Stomach cancer Neg Hx     Social History: I have reviewed social history from progress note on 06/08/2021. Social History   Socioeconomic History   Marital status: Married    Spouse name: Ave Leisure   Number of children: 2   Years of education: Not on file   Highest education level: 11th grade  Occupational History   Occupation: Disabled    Employer: DISABLED  Tobacco Use   Smoking status: Every Day    Current packs/day: 1.50    Average packs/day: 1.5 packs/day for 54.4 years (81.6 ttl pk-yrs)    Types: Cigarettes    Start date: 1971    Passive exposure: Never   Smokeless tobacco: Never   Tobacco comments:    Started smoking at age 45,still smoking pack - 1-1/2 packs  per day 01/08/2021  Vaping Use   Vaping status: Never Used  Substance and Sexual Activity   Alcohol use: No   Drug use: Yes    Frequency: 1.0 times per week    Types: Marijuana    Comment: last used 10/23/21   Sexual activity: Yes    Partners: Female  Other Topics Concern   Not on file  Social History Narrative   HSG disabled due to schizophrenia - 1983. stable long-term marriage with children. cared for his father-in-law- passed away in Oct 06, 2023   Social Drivers of Health   Financial Resource Strain: Low Risk  (11/16/2022)   Overall  Financial Resource Strain (CARDIA)    Difficulty of Paying Living Expenses: Not hard at all  Food Insecurity: No Food Insecurity (11/16/2022)   Hunger Vital Sign    Worried About Running Out of Food in the Last Year: Never true    Ran Out of Food in the Last Year: Never true  Transportation Needs: No Transportation Needs (11/16/2022)   PRAPARE - Administrator, Civil Service (Medical): No    Lack of Transportation (Non-Medical): No  Physical Activity: Inactive (11/16/2022)   Exercise Vital Sign    Days of Exercise per Week: 0 days    Minutes of Exercise per Session: 0 min  Stress: No Stress Concern Present (11/16/2022)   [employer]  of Occupational Health - Occupational Stress Questionnaire    Feeling of Stress : Only a little  Social Connections: Moderately Isolated (11/16/2022)   Social Connection and Isolation Panel [NHANES]    Frequency of Communication with Friends and Family: More than three times a week    Frequency of Social Gatherings with Friends and Family: Twice a week    Attends Religious Services: Never    Database administrator or Organizations: No    Attends Banker Meetings: Never    Marital Status: Married    Allergies:  Allergies  Allergen Reactions   Iohexol Other (See Comments)    Affects nerves: triggers schizophrenia   Ivp Dye [Iodinated Contrast Media]      triggers schizophrenia   Metoclopramide Hcl Other (See Comments)    Affects nerves: triggers schizophrenia   Ritalin [Methylphenidate]      triggers schizophrenia   Wellbutrin [Bupropion] Other (See Comments)    Makes pt smoke heavily    Metabolic Disorder Labs: Lab Results  Component Value Date   HGBA1C 5.9 (H) 08/20/2022   MPG 123 08/20/2022   No results found for: "PROLACTIN" Lab Results  Component Value Date   CHOL 214 (H) 11/19/2021   TRIG 234.0 (H) 11/19/2021   HDL 38.60 (L) 11/19/2021   CHOLHDL 6 11/19/2021   VLDL 46.8 (H) 11/19/2021   LDLCALC 99  10/24/2018   LDLCALC 110 (H) 10/19/2017   Lab Results  Component Value Date   TSH 1.98 08/20/2022   TSH 1.73 05/21/2021    Therapeutic Level Labs: No results found for: "LITHIUM" No results found for: "VALPROATE" No results found for: "CBMZ"  Current Medications: Current Outpatient Medications  Medication Sig Dispense Refill   acetaminophen  (TYLENOL ) 500 MG tablet Take 1,000 mg by mouth every 6 (six) hours as needed for moderate pain.      albuterol  (PROVENTIL ) (2.5 MG/3ML) 0.083% nebulizer solution INHALE 3 ML BY NEBULIZATION EVERY 6 HOURS AS NEEDED FOR WHEEZING OR SHORTNESS OF BREATH 150 mL 0   albuterol  (VENTOLIN  HFA) 108 (90 Base) MCG/ACT inhaler TAKE 2 PUFFS BY MOUTH EVERY 6 HOURS AS NEEDED FOR WHEEZE OR SHORTNESS OF BREATH 8.5 each 6   amLODipine  (NORVASC ) 10 MG tablet TAKE 1 TABLET BY MOUTH EVERY DAY 90 tablet 3   benztropine  (COGENTIN ) 0.5 MG tablet Take 1 tablet (0.5 mg total) by mouth 2 (two) times daily. As needed for side effects of Prolixin  180 tablet 1   Blood Pressure Monitoring (BLOOD PRESSURE CUFF) MISC Use daily as directed to check blood pressure 1 each 0   celecoxib  (CELEBREX ) 50 MG capsule TAKE 1 CAPSULE (50 MG TOTAL) BY MOUTH 2 (TWO) TIMES DAILY AS NEEDED FOR PAIN. 60 capsule 6   cetirizine  (ZYRTEC ) 10 MG tablet Take 1 tablet (10 mg total) by mouth daily. 30 tablet 11   Cyanocobalamin  (VITAMIN B-12 PO) Take 1 tablet by mouth daily.     cyclobenzaprine  (FLEXERIL ) 10 MG tablet TAKE 1 TABLET BY MOUTH EVERYDAY AT BEDTIME 30 tablet 3   diclofenac  Sodium (VOLTAREN ) 1 % GEL Apply 2 g topically 4 (four) times daily as needed (pain). 150 g 0   EPINEPHrine  0.3 mg/0.3 mL IJ SOAJ injection Inject 0.3 mg into the muscle as needed for anaphylaxis. 1 each 1   esomeprazole  (NEXIUM ) 40 MG capsule TAKE 1 CAPSULE BY MOUTH EVERY DAY 90 capsule 3   fluPHENAZine  (PROLIXIN ) 10 MG tablet Take 1 tablet (10 mg total) by mouth daily. 90 tablet 1  fluticasone  (FLONASE ) 50 MCG/ACT nasal  spray Place 2 sprays into both nostrils daily. 16 g 6   gabapentin  (NEURONTIN ) 300 MG capsule TAKE 1 CAPSULE BY MOUTH TWICE A DAY 180 capsule 3   hydrOXYzine  (VISTARIL ) 25 MG capsule Take 25 mg by mouth every 8 (eight) hours as needed for anxiety.     potassium chloride  SA (KLOR-CON  M) 20 MEQ tablet TAKE 1 TABLET BY MOUTH EVERY DAY 90 tablet 3   rosuvastatin  (CRESTOR ) 10 MG tablet TAKE 1 TABLET BY MOUTH EVERY DAY 90 tablet 3   sildenafil  (VIAGRA ) 100 MG tablet Take 100 mg by mouth daily as needed for erectile dysfunction.     SYMBICORT  160-4.5 MCG/ACT inhaler INHALE 2 PUFFS INTO THE LUNGS IN THE MORNING AND 2 PUFFS AT BEDTIME 30.6 each 1   tamsulosin (FLOMAX) 0.4 MG CAPS capsule Take 0.8 mg by mouth daily.     traMADol  (ULTRAM ) 50 MG tablet Take 1 tablet (50 mg total) by mouth every 6 (six) hours as needed. 15 tablet 0   traZODone  (DESYREL ) 100 MG tablet Take 0.5-1 tablets (50-100 mg total) by mouth at bedtime as needed for sleep. 90 tablet 0   valsartan  (DIOVAN ) 320 MG tablet Take 1 tablet (320 mg total) by mouth daily. 90 tablet 1   VITAMIN D  PO Take 1 tablet by mouth daily.     No current facility-administered medications for this visit.     Musculoskeletal: Strength & Muscle Tone: within normal limits Gait & Station: normal Patient leans: N/A  Psychiatric Specialty Exam: Review of Systems  Psychiatric/Behavioral:  Positive for sleep disturbance. The patient is nervous/anxious.     Blood pressure 119/80, pulse 97, temperature (!) 97.2 F (36.2 C), temperature source Temporal, height 5\' 9"  (1.753 m), weight 255 lb (115.7 kg).Body mass index is 37.66 kg/m.  General Appearance: Casual  Eye Contact:  Fair  Speech:  Clear and Coherent  Volume:  Normal  Mood:  Anxious  Affect:  Congruent  Thought Process:  Goal Directed and Descriptions of Associations: Intact  Orientation:  Full (Time, Place, and Person)  Thought Content: Logical   Suicidal Thoughts:  No  Homicidal Thoughts:  No   Memory:  Immediate;   Fair Recent;   Fair Remote;   Fair  Judgement:  Fair  Insight:  Fair  Psychomotor Activity:  Normal  Concentration:  Concentration: Fair and Attention Span: Fair  Recall:  [employer] of Knowledge: Fair  Language: Fair  Akathisia:  No  Handed:  Right  AIMS (if indicated): done  Assets:  Communication Skills Desire for Improvement Housing Intimacy Social Support  ADL's:  Intact  Cognition: WNL  Sleep:  Poor   Screenings: Geneticist, molecular Office Visit from 09/19/2023 in [facility] Office Visit from 06/16/2023 in [facility] Office Visit from 03/16/2023 in [facility] Office Visit from 12/14/2022 in [facility] Office Visit from 08/17/2022 in [facility]  AIMS Total Score 0 0 0 0 0      GAD-7    Flowsheet Row Office Visit from 06/16/2023 in [facility] Office Visit from 03/16/2023 in [facility] Office Visit from 12/14/2022 in [facility] Office Visit from 12/16/2021 in [facility] Office Visit from 06/08/2021 in [facility]  Total  GAD-7 Score 2 0 3 4 0      PHQ2-9    Flowsheet Row Office Visit from 09/19/2023 in [facility] Office Visit from 06/16/2023 in [facility] Office Visit from 03/16/2023 in [facility] Office Visit from 12/14/2022 in [facility] Clinical Support from 11/16/2022 in [facility]  PHQ-2 Total Score 0 0 2 0 0  PHQ-9 Total Score -- -- 4  3 0      Flowsheet Row Office Visit from 09/19/2023 in [facility] Office Visit from 06/16/2023 in [facility] Office Visit from 03/16/2023 in [facility]  C-SSRS RISK CATEGORY No Risk No Risk No Risk        Assessment and Plan: [name] is a 68 year old African-American male on disability, history of schizophrenia, hypertension, multiple medical problems was evaluated in office today.  Discussed assessment and plan as noted below.  Schizophrenia-stable Schizophrenia managed on Prolixin  and Cogentin .  Denies any concerns at this time. Continue Prolixin  10 mg daily Continue Benztropine  0.5 mg twice a day as needed for side effects.  Anxiety disorder-Unstable Chronic anxiety exacerbated by pain.  Does have hydroxyzine  available however has not been using it regularly. Encourage use of Hydroxyzine  12.5-25 mg daily as needed for severe anxiety attacks.  Insomnia-unstable Recent sleep problems mostly related to pain. Increase Trazodone  to 50-100 mg at bedtime as needed Will benefit from sufficient pain management.  Tobacco use disorder-unstable Previous trial of Chantix  however did not like it.  However agreeable to retrial of Chantix .  Provided counseling. Patient to let this provider know if interested in Chantix  prescription.  He has supplies at home. Provided counseling for 1 minute.  Follow-up Follow-up in clinic in 3 months or sooner if needed.    Collaboration of Care: Collaboration of Care: Other encouraged follow-up with primary care provider for pain management.  Patient/Guardian was advised Release of Information must be obtained prior to any record release in order to collaborate their care with an outside provider. Patient/Guardian was advised if they have not already done so to contact the registration department to sign all necessary forms in  order for us  to release information regarding their care.   Consent: Patient/Guardian gives verbal consent for treatment and assignment of benefits for services provided during this visit. Patient/Guardian expressed understanding and agreed to proceed.   This note was generated in part or whole with voice recognition software. Voice recognition is usually quite accurate but there are transcription errors that can and very often do occur. I apologize for any typographical errors that were not detected and corrected.    [place] Spainhower, MD 09/19/2023, 9:27 AM

## 2023-10-11 ENCOUNTER — Ambulatory Visit: Payer: Self-pay

## 2023-10-11 ENCOUNTER — Encounter: Payer: Self-pay | Admitting: Family Medicine

## 2023-10-11 ENCOUNTER — Ambulatory Visit (INDEPENDENT_AMBULATORY_CARE_PROVIDER_SITE_OTHER): Admitting: Family Medicine

## 2023-10-11 VITALS — BP 130/74 | HR 100 | Temp 98.0°F | Resp 16 | Ht 69.0 in | Wt 260.0 lb

## 2023-10-11 DIAGNOSIS — M7989 Other specified soft tissue disorders: Secondary | ICD-10-CM | POA: Diagnosis not present

## 2023-10-11 DIAGNOSIS — M5412 Radiculopathy, cervical region: Secondary | ICD-10-CM | POA: Diagnosis not present

## 2023-10-11 DIAGNOSIS — M48062 Spinal stenosis, lumbar region with neurogenic claudication: Secondary | ICD-10-CM | POA: Diagnosis not present

## 2023-10-11 DIAGNOSIS — M5416 Radiculopathy, lumbar region: Secondary | ICD-10-CM | POA: Diagnosis not present

## 2023-10-11 DIAGNOSIS — M7541 Impingement syndrome of right shoulder: Secondary | ICD-10-CM | POA: Diagnosis not present

## 2023-10-11 DIAGNOSIS — M7542 Impingement syndrome of left shoulder: Secondary | ICD-10-CM | POA: Diagnosis not present

## 2023-10-11 NOTE — Patient Instructions (Addendum)
 Give us  2-3 business days to get the results of your labs back.   For the swelling in your lower extremities, be sure to elevate your legs when able, mind the salt intake, stay physically active and consider wearing compression stockings.   Send me a message in 3-4 weeks if not improving.   Let us  know if you need anything.

## 2023-10-11 NOTE — Telephone Encounter (Signed)
 Copied from CRM [id number]. Topic: Clinical - Red Word Triage >> Oct 11, 2023 12:08 PM [place] J wrote: [facility] that prompted transfer to Nurse Triage: Patients hands and feet are swollen.    Reason for Disposition  Swelling of face, arm or hands  (Exception: Slight puffiness of fingers occurring during hot weather.)  Answer Assessment - Initial Assessment Questions 1. ONSET: When did the swelling start? (e.g., minutes, hours, days)     2-3 weeks  2. LOCATION: What part of the leg is swollen?  Are both legs swollen or just one leg?     Bilateral feet  3. SEVERITY: How bad is the swelling? (e.g., localized; mild, moderate, severe)   - Localized: Small area of swelling localized to one leg.   - MILD pedal edema: Swelling limited to foot and ankle, pitting edema < 1/4 inch (6 mm) deep, rest and elevation eliminate most or all swelling.   - MODERATE edema: Swelling of lower leg to knee, pitting edema > 1/4 inch (6 mm) deep, rest and elevation only partially reduce swelling.   - SEVERE edema: Swelling extends above knee, facial or hand swelling present.      Mild, swelling of the foot  4. REDNESS: Does the swelling look red or infected?     No 5. PAIN: Is the swelling painful to touch? If Yes, ask: How painful is it?   (Scale 1-10; mild, moderate or severe)     0/10 6. FEVER: Do you have a fever? If Yes, ask: What is it, how was it measured, and when did it start?      No 7. CAUSE: What do you think is causing the leg swelling?     Unsure  8. MEDICAL HISTORY: Do you have a history of blood clots (e.g., DVT), cancer, heart failure, kidney disease, or liver failure?     No 9. RECURRENT SYMPTOM: Have you had leg swelling before? If Yes, ask: When was the last time? What happened that time?     No 10. OTHER SYMPTOMS: Do you have any other symptoms? (e.g., chest pain, difficulty breathing)       Bilateral hand swelling  Protocols used: Leg Swelling and  Edema-A-AH    FYI Only or Action Required?: FYI only for provider.  Patient was last seen in primary care on 06/03/2023 by Jason Leita Repine, FNP. Called Nurse Triage reporting Foot Swelling. Symptoms began several weeks ago. Interventions attempted: Nothing. Symptoms are: gradually worsening.  Triage Disposition: See Physician Within 24 Hours  Patient/caregiver understands and will follow disposition?: Yes

## 2023-10-11 NOTE — Progress Notes (Signed)
 Chief Complaint  Patient presents with   Foot Swelling    Feet  and Hand swelling    [name] here for bilateral leg swelling.  He is here with his spouse.  Duration: 3 weeks Hx of prolonged bedrest, recent surgery, travel or injury? No Pain the calf? Nothing new SOB? No Personal or family history of clot or bleeding disorder? No Hx of heart failure, renal failure, hepatic failure? Renal failure Up 10-15 lbs.  Diet is fair, no increase in salt. He is compliant w Norvasc  10 mg/d.  Does not exercise routinely.   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   LOW BACK PAIN, CHRONIC 11/08/2007   Pancreatitis    Prostate cancer (HCC)    Rectal abscess    SCHIZOPHRENIA NEC, CHRONIC 01/25/2007   SCOLIOSIS NEC 01/25/2007   Sebaceous cyst 08/10/2010   SMOKER 08/05/2009   UPPER GASTROINTESTINAL HEMORRHAGE 01/25/2007   Family History  Problem Relation Age of Onset   Hypertension Mother    Bone cancer Mother    Hypertension Father    Kidney disease Father    Prostate cancer Brother    Bone cancer Brother    Kidney disease Brother    Mental illness Maternal Grandmother    Mental illness Son    Prostate cancer Other    Colon cancer Neg Hx    Colon polyps Neg Hx    Esophageal cancer Neg Hx    Rectal cancer Neg Hx    Stomach cancer Neg Hx    Past Surgical History:  Procedure  Laterality Date   ABDOMINAL EXPLORATION SURGERY  04/19/1986   CHOLECYSTECTOMY  05/20/2008   Dr. Lily   COLONOSCOPY  04/23/2010   normal   COLONOSCOPY WITH PROPOFOL   01/22/2021   poor prep[   ESOPHAGOGASTRODUODENOSCOPY N/A 12/31/2013   Procedure: ESOPHAGOGASTRODUODENOSCOPY (EGD);  Surgeon: Lupita FORBES Commander, MD;  Location: [facility];  Service: Endoscopy;  Laterality: N/A;   Excision of scalp lesion  07/18/2009   Excision of thyroid  mass     GANGLION CYST EXCISION     right foot x 2   HERNIA REPAIR     ventral   INCISIONAL HERNIA REPAIR  12/01/2011   Procedure: LAPAROSCOPIC INCISIONAL HERNIA;  Surgeon: Vicenta DELENA Poli, MD;  Location: [facility];  Service: General;  Laterality: N/A;   SHOULDER SURGERY     right   UPPER GASTROINTESTINAL ENDOSCOPY      Current Outpatient Medications:    acetaminophen  (TYLENOL ) 500 MG tablet, Take 1,000 mg by mouth every 6 (six) hours as needed for moderate pain. , Disp: , Rfl:    albuterol  (PROVENTIL ) (2.5 MG/3ML) 0.083% nebulizer solution, INHALE 3 ML BY NEBULIZATION EVERY 6 HOURS AS NEEDED FOR WHEEZING OR SHORTNESS OF BREATH, Disp: 150 mL, Rfl: 0   albuterol  (VENTOLIN  HFA) 108 (90 Base) MCG/ACT inhaler, TAKE 2 PUFFS  BY MOUTH EVERY 6 HOURS AS NEEDED FOR WHEEZE OR SHORTNESS OF BREATH, Disp: 8.5 each, Rfl: 6   amLODipine  (NORVASC ) 10 MG tablet, TAKE 1 TABLET BY MOUTH EVERY DAY, Disp: 90 tablet, Rfl: 3   benztropine  (COGENTIN ) 0.5 MG tablet, Take 1 tablet (0.5 mg total) by mouth 2 (two) times daily. As needed for side effects of Prolixin , Disp: 180 tablet, Rfl: 1   Blood Pressure Monitoring (BLOOD PRESSURE CUFF) MISC, Use daily as directed to check blood pressure, Disp: 1 each, Rfl: 0   celecoxib  (CELEBREX ) 50 MG capsule, TAKE 1 CAPSULE (50 MG TOTAL) BY MOUTH 2 (TWO) TIMES DAILY AS NEEDED FOR PAIN., Disp: 60 capsule, Rfl: 6   cetirizine  (ZYRTEC ) 10 MG tablet, Take 1 tablet (10 mg total) by mouth daily., Disp: 30 tablet, Rfl: 11   Cyanocobalamin  (VITAMIN  B-12 PO), Take 1 tablet by mouth daily., Disp: , Rfl:    cyclobenzaprine  (FLEXERIL ) 10 MG tablet, TAKE 1 TABLET BY MOUTH EVERYDAY AT BEDTIME, Disp: 30 tablet, Rfl: 3   diclofenac  Sodium (VOLTAREN ) 1 % GEL, Apply 2 g topically 4 (four) times daily as needed (pain)., Disp: 150 g, Rfl: 0   EPINEPHrine  0.3 mg/0.3 mL IJ SOAJ injection, Inject 0.3 mg into the muscle as needed for anaphylaxis., Disp: 1 each, Rfl: 1   esomeprazole  (NEXIUM ) 40 MG capsule, TAKE 1 CAPSULE BY MOUTH EVERY DAY, Disp: 90 capsule, Rfl: 3   fluPHENAZine  (PROLIXIN ) 10 MG tablet, Take 1 tablet (10 mg total) by mouth daily., Disp: 90 tablet, Rfl: 1   fluticasone  (FLONASE ) 50 MCG/ACT nasal spray, Place 2 sprays into both nostrils daily., Disp: 16 g, Rfl: 6   gabapentin  (NEURONTIN ) 300 MG capsule, TAKE 1 CAPSULE BY MOUTH TWICE A DAY, Disp: 180 capsule, Rfl: 3   hydrOXYzine  (VISTARIL ) 25 MG capsule, Take 25 mg by mouth every 8 (eight) hours as needed for anxiety., Disp: , Rfl:    potassium chloride  SA (KLOR-CON  M) 20 MEQ tablet, TAKE 1 TABLET BY MOUTH EVERY DAY, Disp: 90 tablet, Rfl: 3   rosuvastatin  (CRESTOR ) 10 MG tablet, TAKE 1 TABLET BY MOUTH EVERY DAY, Disp: 90 tablet, Rfl: 3   sildenafil  (VIAGRA ) 100 MG tablet, Take 100 mg by mouth daily as needed for erectile dysfunction., Disp: , Rfl:    SYMBICORT  160-4.5 MCG/ACT inhaler, INHALE 2 PUFFS INTO THE LUNGS IN THE MORNING AND 2 PUFFS AT BEDTIME, Disp: 30.6 each, Rfl: 1   tamsulosin (FLOMAX) 0.4 MG CAPS capsule, Take 0.8 mg by mouth daily., Disp: , Rfl:    traMADol  (ULTRAM ) 50 MG tablet, Take 1 tablet (50 mg total) by mouth every 6 (six) hours as needed., Disp: 15 tablet, Rfl: 0   traZODone  (DESYREL ) 100 MG tablet, Take 0.5-1 tablets (50-100 mg total) by mouth at bedtime as needed for sleep., Disp: 90 tablet, Rfl: 0   valsartan  (DIOVAN ) 320 MG tablet, Take 1 tablet (320 mg total) by mouth daily., Disp: 90 tablet, Rfl: 1   VITAMIN D  PO, Take 1 tablet by mouth daily., Disp: , Rfl:   BP  130/74 (BP Location: Left Arm, Patient Position: Sitting)   Pulse 100   Temp 98 F (36.7 C) (Oral)   Resp 16   Ht 5' 9 (1.753 m)   Wt 260 lb (117.9 kg)   SpO2 96%   BMI 38.40 kg/m  Gen- awake, alert, appears stated age Heart- RRR, no murmurs, 3+ pitting edema around the ankles bilaterally, 1+ proximal to that tapering at the knees bilaterally. Lungs-  CTAB, normal effort w/o accessory muscle use MSK-no calf pain with palpation, negative Homans bilaterally Psych: Age appropriate judgment and insight  Localized swelling of both lower extremities - Plan: Comprehensive metabolic panel with GFR  Possibly an adverse effect of chronic medication.  He has chronic kidney disease.  Check levels today.  He would prefer to avoid going back on a water pill.  While he waits on these, he will elevate the legs, mind salt intake, try to increase physical activity, and wear compression stockings.  He will send a message in a few weeks and if not having significant improvement, would have him hold his amlodipine . F/u with his regular PCP as originally scheduled. Pt and his spouse voiced understanding and agreement to the plan.  Mabel [place], DO 10/11/23  3:26 PM

## 2023-10-12 ENCOUNTER — Ambulatory Visit: Payer: Self-pay | Admitting: Family Medicine

## 2023-10-12 LAB — COMPREHENSIVE METABOLIC PANEL WITH GFR
ALT: 8 U/L (ref 0–53)
AST: 10 U/L (ref 0–37)
Albumin: 4.1 g/dL (ref 3.5–5.2)
Alkaline Phosphatase: 59 U/L (ref 39–117)
BUN: 6 mg/dL (ref 6–23)
CO2: 27 meq/L (ref 19–32)
Calcium: 9.1 mg/dL (ref 8.4–10.5)
Chloride: 103 meq/L (ref 96–112)
Creatinine, Ser: 1.27 mg/dL (ref 0.40–1.50)
GFR: 58.84 mL/min — ABNORMAL LOW (ref >60.00)
Glucose, Bld: 96 mg/dL (ref 70–99)
Potassium: 3.8 meq/L (ref 3.5–5.1)
Sodium: 140 meq/L (ref 135–145)
Total Bilirubin: 0.4 mg/dL (ref 0.2–1.2)
Total Protein: 6.5 g/dL (ref 6.0–8.3)

## 2023-10-18 DIAGNOSIS — M5412 Radiculopathy, cervical region: Secondary | ICD-10-CM | POA: Diagnosis not present

## 2023-10-25 ENCOUNTER — Ambulatory Visit: Admitting: Podiatry

## 2023-11-11 ENCOUNTER — Other Ambulatory Visit: Payer: Self-pay | Admitting: Family

## 2023-11-11 ENCOUNTER — Other Ambulatory Visit: Payer: Self-pay | Admitting: Psychiatry

## 2023-11-11 DIAGNOSIS — F418 Other specified anxiety disorders: Secondary | ICD-10-CM

## 2023-11-11 DIAGNOSIS — F203 Undifferentiated schizophrenia: Secondary | ICD-10-CM

## 2023-11-18 ENCOUNTER — Ambulatory Visit: Payer: Self-pay | Admitting: Family

## 2023-11-18 ENCOUNTER — Other Ambulatory Visit: Payer: Self-pay

## 2023-11-18 ENCOUNTER — Ambulatory Visit: Payer: 59 | Admitting: Family

## 2023-11-18 ENCOUNTER — Encounter: Payer: Self-pay | Admitting: Family

## 2023-11-18 VITALS — BP 138/88 | HR 89 | Resp 16 | Ht 69.0 in | Wt 252.0 lb

## 2023-11-18 DIAGNOSIS — Z125 Encounter for screening for malignant neoplasm of prostate: Secondary | ICD-10-CM

## 2023-11-18 DIAGNOSIS — Z87891 Personal history of nicotine dependence: Secondary | ICD-10-CM

## 2023-11-18 DIAGNOSIS — E782 Mixed hyperlipidemia: Secondary | ICD-10-CM

## 2023-11-18 DIAGNOSIS — Z720 Tobacco use: Secondary | ICD-10-CM | POA: Diagnosis not present

## 2023-11-18 DIAGNOSIS — F17210 Nicotine dependence, cigarettes, uncomplicated: Secondary | ICD-10-CM

## 2023-11-18 DIAGNOSIS — Z122 Encounter for screening for malignant neoplasm of respiratory organs: Secondary | ICD-10-CM

## 2023-11-18 DIAGNOSIS — Z0000 Encounter for general adult medical examination without abnormal findings: Secondary | ICD-10-CM | POA: Diagnosis not present

## 2023-11-18 LAB — CBC WITH DIFFERENTIAL/PLATELET
Basophils Absolute: 0 K/uL (ref 0.0–0.1)
Basophils Relative: 0.3 % (ref 0.0–3.0)
Eosinophils Absolute: 0.1 K/uL (ref 0.0–0.7)
Eosinophils Relative: 0.8 % (ref 0.0–5.0)
HCT: 41.5 % (ref 39.0–52.0)
Hemoglobin: 13.7 g/dL (ref 13.0–17.0)
Lymphocytes Relative: 24 % (ref 12.0–46.0)
Lymphs Abs: 2.5 K/uL (ref 0.7–4.0)
MCHC: 33.1 g/dL (ref 30.0–36.0)
MCV: 87.7 fl (ref 78.0–100.0)
Monocytes Absolute: 0.8 K/uL (ref 0.1–1.0)
Monocytes Relative: 7.4 % (ref 3.0–12.0)
Neutro Abs: 7 K/uL (ref 1.4–7.7)
Neutrophils Relative %: 67.5 % (ref 43.0–77.0)
Platelets: 301 K/uL (ref 150.0–400.0)
RBC: 4.73 Mil/uL (ref 4.22–5.81)
RDW: 15 % (ref 11.5–15.5)
WBC: 10.3 K/uL (ref 4.0–10.5)

## 2023-11-18 LAB — COMPREHENSIVE METABOLIC PANEL WITH GFR
ALT: 12 U/L (ref 0–53)
AST: 10 U/L (ref 0–37)
Albumin: 4.1 g/dL (ref 3.5–5.2)
Alkaline Phosphatase: 74 U/L (ref 39–117)
BUN: 5 mg/dL — ABNORMAL LOW (ref 6–23)
CO2: 29 meq/L (ref 19–32)
Calcium: 9.4 mg/dL (ref 8.4–10.5)
Chloride: 102 meq/L (ref 96–112)
Creatinine, Ser: 1.15 mg/dL (ref 0.40–1.50)
GFR: 66.23 mL/min (ref >60.00)
Glucose, Bld: 100 mg/dL — ABNORMAL HIGH (ref 70–99)
Potassium: 3.4 meq/L — ABNORMAL LOW (ref 3.5–5.1)
Sodium: 141 meq/L (ref 135–145)
Total Bilirubin: 0.3 mg/dL (ref 0.2–1.2)
Total Protein: 6.7 g/dL (ref 6.0–8.3)

## 2023-11-18 LAB — LIPID PANEL
Cholesterol: 154 mg/dL (ref 0–200)
HDL: 56.4 mg/dL (ref >39.00)
LDL Cholesterol: 74 mg/dL (ref 0–99)
NonHDL: 97.83
Total CHOL/HDL Ratio: 3
Triglycerides: 117 mg/dL (ref 0.0–149.0)
VLDL: 23.4 mg/dL (ref 0.0–40.0)

## 2023-11-18 LAB — HEMOGLOBIN A1C: Hgb A1c MFr Bld: 6.2 % (ref 4.6–6.5)

## 2023-11-18 LAB — PSA: PSA: 11.46 ng/mL — ABNORMAL HIGH (ref 0.10–4.00)

## 2023-11-18 MED ORDER — VALSARTAN 320 MG PO TABS: 320.0000 mg | ORAL_TABLET | Freq: Every day | ORAL | 1 refills | Status: DC

## 2023-11-18 NOTE — Progress Notes (Signed)
 [name] is a 68 y.o. male with the following history as recorded in [employer]:  Patient Active Problem List   Diagnosis Date Noted   Spinal stenosis of lumbar region 06/08/2023   Insomnia 12/14/2022   Carpal tunnel syndrome of right wrist 10/29/2022   Ulnar neuropathy of right upper extremity 10/22/2022   Paresthesia 08/26/2021   Ulnar neuropathy at elbow, left 08/26/2021   Gait abnormality 07/14/2021   Chronic bilateral low back pain with bilateral sciatica 07/14/2021   Neck pain 07/14/2021   Left hand weakness 07/14/2021   Trigger finger, right middle finger 06/02/2021   Coronary artery calcification 08/15/2020   Nodule of apex of right lung 05/29/2020   Malignant neoplasm of prostate (HCC) 10/26/2019   Body mass index (BMI) 25.0-25.9, adult 10/25/2019   Spondylosis of cervical region without myelopathy or radiculopathy 09/26/2019   Numbness of hand 06/22/2019   Ulnar neuropathy at elbow of left upper extremity 06/05/2019   Chronic obstructive pulmonary disease (HCC) 10/24/2018   Cervical radiculopathy at C8 01/18/2018   Right lateral epicondylitis 12/21/2017   Pain in joint, shoulder region 04/23/2015   Periumbilical abdominal pain 02/13/2015   Elevated PSA 08/04/2014   Subacromial bursitis 04/11/2014   Neck pain on right side 03/05/2014   Bilateral shoulder pain 03/05/2014   Recurrent boils 03/05/2014   Foreign body in stomach 12/31/2013   Reflux esophagitis 12/31/2013   Weight loss 05/06/2012   Incisional hernia 11/23/2011   Cramp of limb 01/13/2011   Hyperthyroidism 08/11/2010   Sebaceous cyst 08/10/2010   HYPERTROPHY PROSTATE W/UR OBST & OTH LUTS 02/27/2010   Tobacco use disorder 08/05/2009   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      albuterol  (PROVENTIL ) (2.5 MG/3ML) 0.083% nebulizer solution INHALE 3 ML BY NEBULIZATION EVERY 6 HOURS AS NEEDED FOR WHEEZING OR SHORTNESS OF BREATH 150 mL 0   albuterol  (VENTOLIN  HFA) 108 (90 Base) MCG/ACT inhaler TAKE 2 PUFFS BY MOUTH EVERY 6 HOURS AS NEEDED FOR WHEEZE OR SHORTNESS OF BREATH 8.5 each 6   benztropine  (COGENTIN ) 0.5 MG tablet Take 1 tablet (0.5 mg total) by mouth 2 (two) times daily. As needed for side effects of Prolixin  180 tablet 1   Blood Pressure Monitoring (BLOOD PRESSURE CUFF) MISC Use daily as directed to check blood pressure 1 each 0   celecoxib  (CELEBREX ) 50 MG capsule TAKE 1 CAPSULE (50 MG TOTAL) BY MOUTH 2 (TWO) TIMES DAILY AS NEEDED FOR PAIN. 60 capsule 6   cetirizine  (ZYRTEC ) 10 MG tablet Take 1 tablet (10 mg total) by mouth daily. 30 tablet 11   Cyanocobalamin  (VITAMIN B-12 PO) Take 1 tablet by mouth daily.     cyclobenzaprine  (FLEXERIL ) 10 MG tablet TAKE 1 TABLET BY MOUTH EVERYDAY AT BEDTIME 30 tablet 3   diclofenac  Sodium (VOLTAREN ) 1 % GEL Apply 2 g topically 4 (four) times daily as needed (pain). 150 g 0   EPINEPHrine  0.3 mg/0.3 mL IJ SOAJ injection Inject 0.3 mg  into the muscle as needed for anaphylaxis. 1 each 1   esomeprazole  (NEXIUM ) 40 MG capsule TAKE 1 CAPSULE BY MOUTH EVERY DAY 90 capsule 3   fluPHENAZine  (PROLIXIN ) 10 MG tablet TAKE 1 TABLET BY MOUTH EVERY DAY 90 tablet 1   fluticasone  (FLONASE ) 50 MCG/ACT nasal spray Place 2 sprays into both nostrils daily. 16 g 6   gabapentin  (NEURONTIN ) 300 MG capsule TAKE 1 CAPSULE BY MOUTH TWICE A DAY 180 capsule 3   hydrOXYzine  (VISTARIL ) 25 MG capsule Take 25 mg by mouth every  8 (eight) hours as needed for anxiety.     potassium chloride  SA (KLOR-CON  M) 20 MEQ tablet TAKE 1 TABLET BY MOUTH EVERY DAY 90 tablet 3   rosuvastatin  (CRESTOR ) 10 MG tablet TAKE 1 TABLET BY MOUTH EVERY DAY 90 tablet 3   sildenafil  (VIAGRA ) 100 MG tablet Take 100 mg by mouth daily as needed for erectile dysfunction.     SYMBICORT  160-4.5 MCG/ACT inhaler INHALE 2 PUFFS INTO THE LUNGS IN THE MORNING AND 2 PUFFS AT BEDTIME 30.6 each 1   tamsulosin (FLOMAX) 0.4 MG CAPS capsule Take 0.8 mg by mouth daily.     traMADol  (ULTRAM ) 50 MG tablet Take 1 tablet (50 mg total) by mouth every 6 (six) hours as needed. 15 tablet 0   traZODone  (DESYREL ) 100 MG tablet Take 0.5-1 tablets (50-100 mg total) by mouth at bedtime as needed for sleep. 90 tablet 0   VITAMIN D  PO Take 1 tablet by mouth daily.     valsartan  (DIOVAN ) 320 MG tablet Take 1 tablet (320 mg total) by mouth daily. 90 tablet 1   No current facility-administered medications for this visit.    Allergies: Iohexol, Ivp dye [iodinated contrast media], Metoclopramide hcl, Ritalin [methylphenidate], and Wellbutrin [bupropion]  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 Lily   COLONOSCOPY  04/23/2010   normal   COLONOSCOPY WITH PROPOFOL   01/22/2021   poor prep[   ESOPHAGOGASTRODUODENOSCOPY N/A 12/31/2013   Procedure: ESOPHAGOGASTRODUODENOSCOPY (EGD);  Surgeon: Lupita FORBES Commander, MD;  Location: [facility];  Service: Endoscopy;  Laterality: N/A;   Excision of scalp lesion  07/18/2009   Excision of thyroid  mass     GANGLION CYST EXCISION     right foot x 2   HERNIA REPAIR     ventral   INCISIONAL HERNIA REPAIR  12/01/2011   Procedure: LAPAROSCOPIC INCISIONAL HERNIA;  Surgeon: Vicenta DELENA Poli, MD;  Location: [facility];  Service: General;  Laterality: N/A;   SHOULDER SURGERY     right   UPPER GASTROINTESTINAL ENDOSCOPY      Family History  Problem Relation Age of Onset   Hypertension Mother    Bone cancer Mother    Hypertension Father    Kidney disease Father    Prostate cancer Brother    Bone cancer Brother    Kidney disease Brother    Mental illness Maternal Grandmother    Mental illness Son    Prostate cancer Other    Colon cancer Neg Hx    Colon polyps Neg Hx    Esophageal cancer Neg Hx    Rectal cancer Neg Hx    Stomach cancer Neg Hx     Social History   Tobacco Use   Smoking status: Every Day    Current packs/day: 1.50    Average packs/day: 1.5 packs/day for 54.6 years (81.9 ttl pk-yrs)    Types: Cigarettes    Start date: 1971    Passive exposure: Never   Smokeless tobacco: Never   Tobacco comments:    Started smoking at age 82,still smoking pack - 1-1/2 packs  per day 01/08/2021  Substance Use Topics   Alcohol use: No    Subjective:   Presents for CPE; accompanied by wife; At last OV, he was told he could stop Amlodipine  due to concerns about swelling in his feet. He admits he got confused and stopped the Valsartan . He  has continue with some swelling in his feet. Denies any chest pain, shortness of breath, blurred vision or headache  Review of Systems  Constitutional: Negative.   HENT: Negative.    Eyes: Negative.   Respiratory: Negative.    Cardiovascular: Negative.   Gastrointestinal: Negative.   Genitourinary: Negative.   Musculoskeletal: Negative.   Skin: Negative.   Neurological: Negative.   Endo/Heme/Allergies: Negative.   Psychiatric/Behavioral: Negative.       Objective:  Vitals:   11/18/23 1022  BP: 138/88  Pulse: 89  Resp: 16  SpO2: 96%  Weight: 252 lb (114.3 kg)  Height: 5' 9 (1.753 m)    General: Well developed, well nourished, in no acute distress  Skin : Warm and dry.  Head: Normocephalic and atraumatic  Eyes: Sclera and conjunctiva clear; pupils round and reactive to light; extraocular movements intact  Ears: External normal; canals clear; tympanic membranes normal  Oropharynx: Pink, supple. No suspicious lesions  Neck: Supple without thyromegaly, adenopathy  Lungs: Respirations unlabored; clear to auscultation bilaterally without wheeze, rales, rhonchi  CVS exam: normal rate and regular rhythm.  Abdomen: Soft; nontender; nondistended; normoactive bowel sounds; no masses or hepatosplenomegaly  Musculoskeletal: No deformities; no active joint inflammation  Extremities: No edema, cyanosis, clubbing  Vessels: Symmetric bilaterally  Neurologic: Alert and oriented; speech intact; face symmetrical; moves all extremities well; CNII-XII intact without focal deficit   Assessment:  1. PE (physical exam), annual   2. Mixed hyperlipidemia   3. Prostate cancer screening   4. Tobacco abuse     Plan:  Age appropriate preventive healthcare needs addressed; encouraged regular eye doctor and dental exams; encouraged regular exercise; will update labs and refills as needed today; Patient defers any type of vaccine. He is reminded to stop the Amlodipine  and re-start his Valsartan ;  referral for lung cancer screening. Follow up in 1 month, sooner prn.    No follow-ups on file.  Orders Placed This Encounter  Procedures   Comprehensive metabolic panel with GFR  CBC with Differential/Platelet   Lipid panel   Hemoglobin A1c   PSA   Ambulatory Referral Lung Cancer Screening [facility]    Referral Priority:   Routine    Referral Type:   Consultation    Referral Reason:   Specialty Services Required    Number of Visits Requested:   1    Requested Prescriptions   Signed Prescriptions Disp Refills   valsartan  (DIOVAN ) 320 MG tablet 90 tablet 1    Sig: Take 1 tablet (320 mg total) by mouth daily.

## 2023-11-18 NOTE — Patient Instructions (Signed)
 You can hold the Amlodipine  as we discussed- that is the medication that would be most likely to cause the swelling in your feet. Please take your Valsartan  320 mg daily- I have updated your prescription for that medication.

## 2023-11-21 ENCOUNTER — Emergency Department (HOSPITAL_BASED_OUTPATIENT_CLINIC_OR_DEPARTMENT_OTHER)

## 2023-11-21 ENCOUNTER — Emergency Department (HOSPITAL_BASED_OUTPATIENT_CLINIC_OR_DEPARTMENT_OTHER)
Admission: EM | Admit: 2023-11-21 | Discharge: 2023-11-21 | Disposition: A | Discharge status: Home / Self Care | Attending: Emergency Medicine | Admitting: Emergency Medicine

## 2023-11-21 ENCOUNTER — Encounter (HOSPITAL_BASED_OUTPATIENT_CLINIC_OR_DEPARTMENT_OTHER): Payer: Self-pay

## 2023-11-21 ENCOUNTER — Encounter: Payer: Self-pay | Admitting: Acute Care

## 2023-11-21 ENCOUNTER — Other Ambulatory Visit: Payer: Self-pay

## 2023-11-21 DIAGNOSIS — I10 Essential (primary) hypertension: Secondary | ICD-10-CM | POA: Diagnosis not present

## 2023-11-21 DIAGNOSIS — R072 Precordial pain: Secondary | ICD-10-CM

## 2023-11-21 DIAGNOSIS — R0602 Shortness of breath: Secondary | ICD-10-CM | POA: Insufficient documentation

## 2023-11-21 DIAGNOSIS — R079 Chest pain, unspecified: Secondary | ICD-10-CM | POA: Insufficient documentation

## 2023-11-21 DIAGNOSIS — Z79899 Other long term (current) drug therapy: Secondary | ICD-10-CM | POA: Insufficient documentation

## 2023-11-21 LAB — CBC
HCT: 40.5 % (ref 39.0–52.0)
Hemoglobin: 13.6 g/dL (ref 13.0–17.0)
MCH: 29 pg (ref 26.0–34.0)
MCHC: 33.6 g/dL (ref 30.0–36.0)
MCV: 86.4 fL (ref 80.0–100.0)
Platelets: 316 K/uL (ref 150–400)
RBC: 4.69 MIL/uL (ref 4.22–5.81)
RDW: 14.7 % (ref 11.5–15.5)
WBC: 9.7 K/uL (ref 4.0–10.5)
nRBC: 0 % (ref 0.0–0.2)

## 2023-11-21 LAB — HEPATIC FUNCTION PANEL
ALT: 12 U/L (ref 0–44)
AST: 17 U/L (ref 15–41)
Albumin: 4.1 g/dL (ref 3.5–5.0)
Alkaline Phosphatase: 77 U/L (ref 38–126)
Bilirubin, Direct: 0.1 mg/dL (ref 0.0–0.2)
Indirect Bilirubin: 0.1 mg/dL — ABNORMAL LOW (ref 0.3–0.9)
Total Bilirubin: 0.2 mg/dL (ref 0.0–1.2)
Total Protein: 6.7 g/dL (ref 6.5–8.1)

## 2023-11-21 LAB — BASIC METABOLIC PANEL WITH GFR
Anion gap: 11 (ref 5–15)
BUN: <5 mg/dL — ABNORMAL LOW (ref 8–23)
CO2: 28 mmol/L (ref 22–32)
Calcium: 9.2 mg/dL (ref 8.9–10.3)
Chloride: 103 mmol/L (ref 98–111)
Creatinine, Ser: 1.12 mg/dL (ref 0.61–1.24)
GFR, Estimated: >60 mL/min (ref >60)
Glucose, Bld: 92 mg/dL (ref 70–99)
Potassium: 3.8 mmol/L (ref 3.5–5.1)
Sodium: 142 mmol/L (ref 135–145)

## 2023-11-21 LAB — TROPONIN T, HIGH SENSITIVITY
Troponin T High Sensitivity: <15 ng/L (ref <19)
Troponin T High Sensitivity: <15 ng/L (ref <19)

## 2023-11-21 LAB — LIPASE, BLOOD: Lipase: 17 U/L (ref 11–51)

## 2023-11-21 MED ORDER — ALUM & MAG HYDROXIDE-SIMETH 200-200-20 MG/5ML PO SUSP
15.0000 mL | Freq: Once | ORAL | Status: AC
  Administered 2023-11-21: 15 mL via ORAL
  Filled 2023-11-21: qty 30

## 2023-11-21 NOTE — ED Triage Notes (Signed)
 C/o constant epigastric pain since last night with slight shortness of breath.

## 2023-11-21 NOTE — ED Notes (Signed)
 Patient using restroom, delay in triage

## 2023-11-21 NOTE — Discharge Instructions (Addendum)
 You have been seen in the Emergency Department (ED) today for chest pain.  As we have discussed today's test results are normal, but you may require further testing.  Please follow up with the recommended doctor as instructed above in these documents regarding today's emergent visit and your recent symptoms to discuss further management.  Continue to take your regular medications.   Return to the Emergency Department (ED) if you experience any further chest pain/pressure/tightness, difficulty breathing, or sudden sweating, or other symptoms that concern you.  Please continue to take take maalox as long as it helps and follow up with your primary care provider and cardiologist.

## 2023-11-21 NOTE — ED Provider Notes (Signed)
 [place] [facility] Provider Note   CSN: [id number] Arrival date & time: 11/21/23  9053     Patient presents with: Chest Pain   [name] is a 68 y.o. male with PMH of schizophrenia, HLD,HTN and multiple abdominal hernia surgeries presents with a two week history of on and off chest pain that worsened last night. Patient reports that this pain was in the middle of his chest and has been constant since yesterday. He took an aspirin this morning but otherwise has not taken anything else for the pain.Aspirin did not help improve the pain. Patient reports that he has had chest pain before that is usually related to indigestion or his hernia surgeries but this pain feels different. Describes the pain as a sharp stabbing pain that started last night. Patient does endorse shortness of breath but says this is a chronic issue and denies any worsening of these symptoms. He also denies any palpitations. He denies any trauma to the area that could have aggravated this pain. Patient does have history of abdominal surgeries for hernia repair and does feel that his hernia feels off. He reports that his last bowel movement was this morning but has not passed any gas since then.    The history is provided by the patient.  Chest Pain      Prior to Admission medications   Medication Sig Start Date End Date Taking? Authorizing Provider  acetaminophen  (TYLENOL ) 500 MG tablet Take 1,000 mg by mouth every 6 (six) hours as needed for moderate pain.     [provider]  albuterol  (PROVENTIL ) (2.5 MG/3ML) 0.083% nebulizer solution INHALE 3 ML BY NEBULIZATION EVERY 6 HOURS AS NEEDED FOR WHEEZING OR SHORTNESS OF BREATH 07/27/22   Jason Leita Repine, FNP  albuterol  (VENTOLIN  HFA) 108 [phone] Base) MCG/ACT inhaler TAKE 2 PUFFS BY MOUTH EVERY 6 HOURS AS NEEDED FOR WHEEZE OR SHORTNESS OF BREATH 02/05/23   Kassie Acquanetta Bradley, MD  benztropine  (COGENTIN ) 0.5 MG tablet Take 1  tablet (0.5 mg total) by mouth 2 (two) times daily. As needed for side effects of Prolixin  06/16/23   Eappen, Saramma, MD  Blood Pressure Monitoring (BLOOD PRESSURE CUFF) MISC Use daily as directed to check blood pressure 06/01/18   Jason Leita Repine, FNP  celecoxib  (CELEBREX ) 50 MG capsule TAKE 1 CAPSULE (50 MG TOTAL) BY MOUTH 2 (TWO) TIMES DAILY AS NEEDED FOR PAIN. 11/25/22   Onita Duos, MD  cetirizine  (ZYRTEC ) 10 MG tablet Take 1 tablet (10 mg total) by mouth daily. 06/27/23   Soldatova, Liuba, MD  Cyanocobalamin  (VITAMIN B-12 PO) Take 1 tablet by mouth daily.    [provider]  cyclobenzaprine  (FLEXERIL ) 10 MG tablet TAKE 1 TABLET BY MOUTH EVERYDAY AT BEDTIME 08/01/23   Jason Leita Repine, FNP  diclofenac  Sodium (VOLTAREN ) 1 % GEL Apply 2 g topically 4 (four) times daily as needed (pain). 02/08/23   Jason Leita Repine, FNP  EPINEPHrine  0.3 mg/0.3 mL IJ SOAJ injection Inject 0.3 mg into the muscle as needed for anaphylaxis. 05/28/23   Theadore Ozell HERO, MD  esomeprazole  (NEXIUM ) 40 MG capsule TAKE 1 CAPSULE BY MOUTH EVERY DAY 11/11/23   Jason Leita Repine, FNP  fluPHENAZine  (PROLIXIN ) 10 MG tablet TAKE 1 TABLET BY MOUTH EVERY DAY 11/11/23   Eappen, Saramma, MD  fluticasone  (FLONASE ) 50 MCG/ACT nasal spray Place 2 sprays into both nostrils daily. 06/27/23   Soldatova, Liuba, MD  gabapentin  (NEURONTIN ) 300 MG capsule TAKE 1 CAPSULE BY MOUTH  TWICE A DAY 11/24/22   Jason Leita Repine, FNP  hydrOXYzine  (VISTARIL ) 25 MG capsule Take 25 mg by mouth every 8 (eight) hours as needed for anxiety.    [provider]  potassium chloride  SA (KLOR-CON  M) 20 MEQ tablet TAKE 1 TABLET BY MOUTH EVERY DAY 09/05/23   Jason Leita Repine, FNP  rosuvastatin  (CRESTOR ) 10 MG tablet TAKE 1 TABLET BY MOUTH EVERY DAY 09/05/23   Jason Leita Repine, FNP  sildenafil  (VIAGRA ) 100 MG tablet Take 100 mg by mouth daily as needed for erectile dysfunction. 10/28/20   [provider]  SYMBICORT   160-4.5 MCG/ACT inhaler INHALE 2 PUFFS INTO THE LUNGS IN THE MORNING AND 2 PUFFS AT BEDTIME 12/15/22   Perri Ronal PARAS, MD  tamsulosin (FLOMAX) 0.4 MG CAPS capsule Take 0.8 mg by mouth daily. 06/09/23   [provider]  traMADol  (ULTRAM ) 50 MG tablet Take 1 tablet (50 mg total) by mouth every 6 (six) hours as needed. 12/31/22   Steinl, Kevin, MD  traZODone  (DESYREL ) 100 MG tablet Take 0.5-1 tablets (50-100 mg total) by mouth at bedtime as needed for sleep. 09/19/23   Eappen, Saramma, MD  valsartan  (DIOVAN ) 320 MG tablet Take 1 tablet (320 mg total) by mouth daily. 11/18/23   Jason Leita Repine, FNP  VITAMIN D  PO Take 1 tablet by mouth daily.    [provider]    Allergies: Iohexol, Ivp dye [iodinated contrast media], Metoclopramide hcl, Ritalin [methylphenidate], and Wellbutrin [bupropion]    Review of Systems  Cardiovascular:  Positive for chest pain.  Negative excepted noted in HPI   Updated Vital Signs BP 135/86 (BP Location: Left Arm)   Pulse 88   Temp 97.8 F (36.6 C) (Oral)   Resp (!) 23   Ht 5' 9 (1.753 m)   Wt 114.3 kg   SpO2 95%   BMI 37.21 kg/m   Physical Exam Constitutional:      General: He is not in acute distress. Cardiovascular:     Rate and Rhythm: Normal rate and regular rhythm.     Comments: Radial pulses palpable  Pulmonary:     Effort: Pulmonary effort is normal. No respiratory distress.  Chest:     Comments: No tenderness to palpation in the sternal area, but tenderness to palpation present in the area of xiphoid process  Abdominal:     General: Bowel sounds are normal.     Tenderness: There is no guarding or rebound.     Comments: Protuberant abdomen  Midline healed scar present   Neurological:     Mental Status: He is alert.     (all labs ordered are listed, but only abnormal results are displayed) Labs Reviewed  BASIC METABOLIC PANEL WITH GFR - Abnormal; Notable for the following components:      Result Value   BUN <5 (*)     All other components within normal limits  HEPATIC FUNCTION PANEL - Abnormal; Notable for the following components:   Indirect Bilirubin 0.1 (*)    All other components within normal limits  CBC  LIPASE, BLOOD  TROPONIN T, HIGH SENSITIVITY    EKG: EKG Interpretation Date/Time:  Monday November 21 2023 09:57:34 EDT Ventricular Rate:  89 PR Interval:  165 QRS Duration:  84 QT Interval:  355 QTC Calculation: 432 R Axis:   9  Text Interpretation: Sinus rhythm Confirmed by Darra Chew [phone]) on 11/21/2023 10:02:13 AM  Radiology: [facility] Chest 2 View Result Date: 11/21/2023 CLINICAL DATA:  Chest pain.  Shortness of breath. EXAM: CHEST - 2 VIEW COMPARISON:  09/08/2022. FINDINGS: Bilateral lung fields are clear. Bilateral costophrenic angles are clear. Normal cardio-mediastinal silhouette. No acute osseous abnormalities. The soft tissues are within normal limits. There are surgical staples overlying the midline upper chest region. IMPRESSION: No active cardiopulmonary disease. Electronically Signed   By: Ree Molt M.D.   On: 11/21/2023 10:27     Procedures   Medications Ordered in the ED  alum & mag hydroxide-simeth (MAALOX/MYLANTA) 200-200-20 MG/5ML suspension 15 mL (15 mLs Oral Given 11/21/23 1109)                                    Medical Decision Making Amount and/or Complexity of Data Reviewed Labs: ordered. Radiology: ordered.  Risk OTC drugs.  Initial plan and assessment:  Differential for chest pain includes ACS, pericarditis, endocarditis, myocarditis, pleuritic chest pain, pneumonia, pulmonary embolism, aortic dissection, GERD, esophagitis, pancreatitis, gallbladder pathology. Due to patient being hemodynamically stable with good oxygen saturation and radial pulses, this is reassuring against aortic dissection or pulmonary embolism. Patient has also been afebrile which is reassuring against pneumonia. Patient does not have any history of coronary artery disease that he  reports. Will get EKG and troponin to rule out any ACS. Will also get lipase to rule out pancreatitis. Patient did not have any tenderness on abdominal exam, except to the xiphoid area, slightly higher than the epigastric area. Do not believe that there is any acute abdominal pathology involving surgical intervention at this time. Will check hepatic functional panel for hepatic function and LFT or bilirubin elevation. Will also give patient maalox to see if this helps improve any symptoms.   Re- evaluation: 1200pm- Patient reports that after taking the maalox he feels that the chest pain is not as sharp as it was. Overall improved from when he came in. Labs have been reassuring with no LFT or bilirubin elevation. No lipase elevation as well.EKG did not show any acute ischemic changes and first troponin was within normal limits. Will order second troponin to be sure. Do believe with improvement from maalox that this could be GI related but not any acute pathology.   Final plan and assessment:  Patient reports continued improvement of chest pain. Second troponin was flat and reassuring that patient's symptoms have gotten better with maalox. This is likely not cardiac source of chest pain with reassuring EKG and troponin x 2. Patient has said that he has not followed upw with his cardiologist in over a year.These symptoms are likely due to GI etiology such as esophagitis, GERD flare up. Advised patient that he can continue to take nexium  and maalox OTC to help. Also advised to follow up with primary care provider and a cardiologist.       Final diagnoses:  None    ED Discharge Orders     None          D'Mello, Neela Zecca, DO 11/21/23 1304    Long, Fonda MATSU, MD 11/24/23 1103

## 2023-11-28 ENCOUNTER — Telehealth: Payer: Self-pay | Admitting: Family

## 2023-11-28 NOTE — Telephone Encounter (Signed)
 Copied from CRM [id number]. Topic: General - Other >> Nov 28, 2023  2:46 PM Jasmin G wrote: Reason for CRM: Pt called regarding recent phone call, I could not find a message to relay, please call back at [phone] if needed.

## 2023-11-30 ENCOUNTER — Encounter

## 2023-12-01 NOTE — Telephone Encounter (Signed)
 Spoke with pt, pt is aware and expressed understanding. Scheduled pt a follow up appointment 12/27/2023.

## 2023-12-05 ENCOUNTER — Ambulatory Visit (INDEPENDENT_AMBULATORY_CARE_PROVIDER_SITE_OTHER)

## 2023-12-05 ENCOUNTER — Telehealth: Payer: Self-pay | Admitting: *Deleted

## 2023-12-05 VITALS — BP 139/80 | HR 90 | Temp 98.3°F | Resp 16 | Ht 69.0 in | Wt 257.0 lb

## 2023-12-05 DIAGNOSIS — Z0000 Encounter for general adult medical examination without abnormal findings: Secondary | ICD-10-CM

## 2023-12-05 NOTE — Progress Notes (Signed)
 Please attest this visit in the absence of patient primary care provider.    Subjective:   [name] is a 68 y.o. who presents for a Medicare Wellness preventive visit.  As a reminder, Annual Wellness Visits don't include a physical exam, and some assessments may be limited, especially if this visit is performed virtually. We may recommend an in-person follow-up visit with your provider if needed.  Visit Complete: In person   Persons Participating in Visit: Patient assisted by spouse..  AWV Questionnaire: No: Patient Medicare AWV questionnaire was not completed prior to this visit.  Cardiac Risk Factors include: male gender;hypertension;dyslipidemia;Other (see comment), Risk factor comments: COPD, prostate cancer     Objective:    Today's Vitals   12/05/23 1502  BP: 139/80  Pulse: 90  Resp: 16  Temp: 98.3 F (36.8 C)  TempSrc: Oral  SpO2: 97%  Weight: 257 lb (116.6 kg)  Height: 5' 9 (1.753 m)   Body mass index is 37.95 kg/m.     12/05/2023    3:57 PM 11/21/2023    9:56 AM 05/28/2023    3:08 AM 12/31/2022    2:31 PM 11/16/2022   10:40 AM 09/08/2022    2:37 AM 08/08/2022    7:34 PM  Advanced Directives  Does Patient Have a Medical Advance Directive? No Yes No No No No No  Type of Advance Directive  Living will;Healthcare Power of Attorney       Would patient like information on creating a medical advance directive? Yes (MAU/Ambulatory/Procedural Areas - Information given)  No - Patient declined No - Patient declined No - Patient declined      Current Medications (verified) Outpatient Encounter Medications as of 12/05/2023  Medication Sig   acetaminophen  (TYLENOL ) 500 MG tablet Take 1,000 mg by mouth every 6 (six) hours as needed for moderate pain.    albuterol  (PROVENTIL ) (2.5 MG/3ML) 0.083% nebulizer solution INHALE 3 ML BY NEBULIZATION EVERY 6 HOURS AS NEEDED FOR WHEEZING OR SHORTNESS OF BREATH   albuterol  (VENTOLIN  HFA) 108 (90 Base) MCG/ACT inhaler TAKE 2 PUFFS  BY MOUTH EVERY 6 HOURS AS NEEDED FOR WHEEZE OR SHORTNESS OF BREATH   benztropine  (COGENTIN ) 0.5 MG tablet Take 1 tablet (0.5 mg total) by mouth 2 (two) times daily. As needed for side effects of Prolixin    Blood Pressure Monitoring (BLOOD PRESSURE CUFF) MISC Use daily as directed to check blood pressure   celecoxib  (CELEBREX ) 50 MG capsule TAKE 1 CAPSULE (50 MG TOTAL) BY MOUTH 2 (TWO) TIMES DAILY AS NEEDED FOR PAIN.   cetirizine  (ZYRTEC ) 10 MG tablet Take 1 tablet (10 mg total) by mouth daily.   Cyanocobalamin  (VITAMIN B-12 PO) Take 1 tablet by mouth daily.   cyclobenzaprine  (FLEXERIL ) 10 MG tablet TAKE 1 TABLET BY MOUTH EVERYDAY AT BEDTIME   diclofenac  Sodium (VOLTAREN ) 1 % GEL Apply 2 g topically 4 (four) times daily as needed (pain).   EPINEPHrine  0.3 mg/0.3 mL IJ SOAJ injection Inject 0.3 mg into the muscle as needed for anaphylaxis.   esomeprazole  (NEXIUM ) 40 MG capsule TAKE 1 CAPSULE BY MOUTH EVERY DAY   fluPHENAZine  (PROLIXIN ) 10 MG tablet TAKE 1 TABLET BY MOUTH EVERY DAY   fluticasone  (FLONASE ) 50 MCG/ACT nasal spray Place 2 sprays into both nostrils daily.   gabapentin  (NEURONTIN ) 300 MG capsule TAKE 1 CAPSULE BY MOUTH TWICE A DAY   hydrOXYzine  (VISTARIL ) 25 MG capsule Take 25 mg by mouth every 8 (eight) hours as needed for anxiety.   potassium chloride  SA (  KLOR-CON  M) 20 MEQ tablet TAKE 1 TABLET BY MOUTH EVERY DAY   rosuvastatin  (CRESTOR ) 10 MG tablet TAKE 1 TABLET BY MOUTH EVERY DAY   sildenafil  (VIAGRA ) 100 MG tablet Take 100 mg by mouth daily as needed for erectile dysfunction.   SYMBICORT  160-4.5 MCG/ACT inhaler INHALE 2 PUFFS INTO THE LUNGS IN THE MORNING AND 2 PUFFS AT BEDTIME   tamsulosin (FLOMAX) 0.4 MG CAPS capsule Take 0.8 mg by mouth daily.   traMADol  (ULTRAM ) 50 MG tablet Take 1 tablet (50 mg total) by mouth every 6 (six) hours as needed.   traZODone  (DESYREL ) 100 MG tablet Take 0.5-1 tablets (50-100 mg total) by mouth at bedtime as needed for sleep.   valsartan  (DIOVAN )  320 MG tablet Take 1 tablet (320 mg total) by mouth daily.   VITAMIN D  PO Take 1 tablet by mouth daily.   No facility-administered encounter medications on file as of 12/05/2023.    Allergies (verified) Iohexol, Ivp dye [iodinated contrast media], Metoclopramide hcl, Ritalin [methylphenidate], and Wellbutrin [bupropion]   History: 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 Lily   COLONOSCOPY  04/23/2010   normal   COLONOSCOPY WITH PROPOFOL   01/22/2021   poor prep[   ESOPHAGOGASTRODUODENOSCOPY N/A 12/31/2013   Procedure: ESOPHAGOGASTRODUODENOSCOPY (EGD);  Surgeon: Lupita FORBES Commander, MD;  Location: [facility];  Service: Endoscopy;  Laterality: N/A;   Excision of scalp lesion  07/18/2009   Excision of thyroid  mass     GANGLION CYST EXCISION     right foot x 2   HERNIA REPAIR     ventral   INCISIONAL HERNIA REPAIR  12/01/2011   Procedure: LAPAROSCOPIC INCISIONAL HERNIA;  Surgeon: Vicenta DELENA Poli, MD;  Location: [facility];  Service: General;  Laterality: N/A;   SHOULDER SURGERY     right   UPPER GASTROINTESTINAL ENDOSCOPY     Family History  Problem Relation Age of Onset   Hypertension Mother    Bone cancer Mother    Hypertension Father    Kidney disease Father    Prostate cancer Brother    Bone cancer Brother    Kidney disease Brother    Mental illness Maternal Grandmother    Mental illness Son    Prostate cancer Other    Colon cancer Neg Hx    Colon polyps Neg Hx    Esophageal cancer Neg Hx    Rectal cancer Neg Hx    Stomach cancer  Neg Hx    Social History   Socioeconomic History   Marital status: Married    Spouse name: Doyal   Number of children: 2   Years of education: Not on file   Highest education level: 11th grade  Occupational History   Occupation: Disabled    Employer: DISABLED  Tobacco Use   Smoking status: Every Day    Current packs/day: 1.50    Average packs/day: 1.5 packs/day for 54.6 years (81.9 ttl pk-yrs)    Types: Cigarettes    Start date: 1971    Passive exposure: Never   Smokeless tobacco: Never   Tobacco comments:    Started smoking at age 47,still smoking pack - 1-1/2 packs  per day 01/08/2021  Vaping Use   Vaping status: Never Used  Substance and Sexual Activity   Alcohol use: No   Drug use: Yes    Frequency: 1.0 times per week    Types: Marijuana   Sexual activity: Yes    Partners: Female  Other Topics Concern   Not on file  Social History Narrative   HSG disabled due to schizophrenia - 1983. stable long-term marriage with children. cared for his father-in-law- passed away in November 30, 2023   Social Drivers of Health   Financial Resource Strain: Low Risk  (12/05/2023)   Overall Financial Resource Strain (CARDIA)    Difficulty of Paying Living Expenses: Not very hard  Food Insecurity: No Food Insecurity (12/05/2023)   Hunger Vital Sign    Worried About Running Out of Food in the Last Year: Never true    Ran Out of Food in the  Last Year: Never true  Transportation Needs: No Transportation Needs (12/05/2023)   PRAPARE - Administrator, Civil Service (Medical): No    Lack of Transportation (Non-Medical): No  Physical Activity: Inactive (12/05/2023)   Exercise Vital Sign    Days of Exercise per Week: 0 days    Minutes of Exercise per Session: 0 min  Stress: No Stress Concern Present (12/05/2023)   [employer] of Occupational Health - Occupational Stress Questionnaire    Feeling of Stress: Not at all  Social Connections: Moderately Isolated (12/05/2023)   Social Connection and Isolation Panel    Frequency of Communication with Friends and Family: More than three times a week    Frequency of Social Gatherings with Friends and Family: Twice a week    Attends Religious Services: Never    Database administrator or Organizations: No    Attends Engineer, structural: Never    Marital Status: Married    Tobacco Counseling Ready to quit: Not Answered Counseling given: Not Answered Tobacco comments: Started smoking at age 40,still smoking pack - 1-1/2 packs  per day 01/08/2021    Clinical Intake:  Pre-visit preparation completed: Yes        BMI - recorded: 37.95 Nutritional Status: BMI > 30  Obese Nutritional Risks: None Diabetes: No  Lab Results  Component Value Date   HGBA1C 6.2 11/18/2023   HGBA1C 5.9 (H) 08/20/2022   HGBA1C 5.7 08/01/2014     How often do you need to have someone help you when you read instructions, pamphlets, or other written materials from your doctor or pharmacy?: 1 - Never What is the last grade level you completed in school?: 11th grade  Interpreter Needed?: No  Information entered by :: Lolita Libra, CMA   Activities of Daily Living     12/05/2023    3:25 PM  In your present state of health, do you have any difficulty performing the following activities:  Hearing? 0  Vision? 0  Difficulty concentrating or making decisions? 0  Walking or  climbing stairs? 1  Dressing or bathing? 0  Doing errands, shopping? 1  Preparing Food and eating ? N  Using the Toilet? N  In the past six months, have you accidently leaked urine? Y  Comment has had prostate cancer, sees urology  Do you have problems with loss of bowel control? N  Managing your Medications? N  Managing your Finances? N  Housekeeping or managing your Housekeeping? N    Patient Care Team: Jason Leita Repine, FNP as PCP - General (Internal Medicine) Onita Duos, MD as Consulting Physician (Neurology) Eappen, Saramma, MD as Consulting Physician (Psychiatry) Shari Easter, MD as Consulting Physician (Orthopedic Surgery) Danis, Victory LITTIE MOULD, MD as Consulting Physician (Gastroenterology) [facility] (Optometry)   I have updated your Care Teams any recent Medical Services you may have received from other providers in the past year.     Assessment:   This is a routine wellness examination for [name].  Hearing/Vision screen Hearing Screening - Comments:: Denies hearing difficulties.  Vision Screening - Comments:: Wears RX glasses -- up to date with routine eye exams at [facility]    Goals Addressed   None    Depression Screen     12/05/2023    3:18 PM 12/05/2023    3:17 PM 09/19/2023    9:02 AM 06/16/2023    9:18 AM 03/16/2023   10:18 AM 12/14/2022    3:41 PM 12/14/2022    2:17 PM  PHQ 2/9 Scores  PHQ - 2 Score 0 0       PHQ- 9 Score            Information is confidential and restricted. Go to Review Flowsheets to unlock data.    Fall Risk     12/05/2023    3:21 PM 11/16/2022   10:46 AM 08/20/2022    2:52 PM 08/04/2021    1:46 PM 03/10/2021    2:44 PM  Fall Risk   Falls in the past year? 0 1 1 1 1   Number falls in past yr: 0 1 1 1  0  Injury with Fall? 0 1 1 1 1   Risk for fall due to : Impaired mobility;Orthopedic patient Impaired balance/gait;History of fall(s) History of fall(s) History of fall(s) Impaired balance/gait;History of fall(s)   Follow up Education provided Falls prevention discussed;Education provided;Falls evaluation completed Falls evaluation completed Falls evaluation completed  Falls evaluation completed;Falls prevention discussed      Data saved with a previous flowsheet row definition    MEDICARE RISK AT HOME:  Medicare Risk at Home Any stairs in or around the home?: No If so, are there any without handrails?: No Home free of loose throw rugs in walkways, pet beds, electrical cords, etc?: Yes Adequate lighting in your home to reduce risk of falls?: Yes Life alert?: No Use of a cane, walker or w/c?: Yes (sometimes uses a cane) Grab bars in the bathroom?: No Shower chair or bench in shower?: Yes (has one but doesn't need it.) Elevated toilet seat or a handicapped toilet?: No  TIMED UP AND GO:  Was the test performed?  Yes  Length of time to ambulate 10 feet: 7 sec Gait slow and steady without use of assistive device  Cognitive Function: 6CIT completed        12/05/2023    3:27  PM 11/16/2022   10:49 AM 08/04/2021    1:57 PM  6CIT Screen  What Year? 0 points 0 points 0 points  What month? 0 points 0 points 0 points  What time? 0 points 0 points 0 points  Count back from 20 0 points 0 points 0 points  Months in reverse 0 points 4 points 4 points  Repeat phrase 0 points 0 points 2 points  Total Score 0 points 4 points 6 points    Immunizations Immunization History  Administered Date(s) Administered   Janssen (J&J) SARS-COV-2 Vaccination 06/29/2019   PFIZER Comirnaty(Gray Top)Covid-19 Tri-Sucrose Vaccine 08/05/2020   Pneumococcal Conjugate-13 10/19/2017   Td 04/19/1992   Tdap 10/19/2017    Screening Tests Health Maintenance  Topic Date Due   Lung Cancer Screening  11/07/2022   Medicare Annual Wellness (AWV)  11/16/2023   Zoster Vaccines- Shingrix (1 of 2) 12/04/2024 (Originally 03/08/1975)   Colonoscopy  10/29/2026   DTaP/Tdap/Td (3 - Td or Tdap) 10/20/2027   Hepatitis C Screening   Completed   HPV VACCINES  Aged Out   Meningococcal B Vaccine  Aged Out   Pneumococcal Vaccine  Discontinued   Pneumococcal Vaccine: 50+ Years  Discontinued   INFLUENZA VACCINE  Discontinued   COVID-19 Vaccine  Discontinued    Health Maintenance  Health Maintenance Due  Topic Date Due   Lung Cancer Screening  11/07/2022   Medicare Annual Wellness (AWV)  11/16/2023   Health Maintenance Items Addressed: Pt declines shingles and (all vaccines). Will complete lung cancer screening CT tomorrow. All other HM up to date.  Additional Screening:  Vision Screening: Recommended annual ophthalmology exams for early detection of glaucoma and other disorders of the eye. Would you like a referral to an eye doctor? No    Dental Screening: Recommended annual dental exams for proper oral hygiene  Community Resource Referral / Chronic Care Management: CRR required this visit?  No   CCM required this visit?  No   Plan:    I have personally reviewed and noted the following in the patient's chart:   Medical and social history Use of alcohol, tobacco or illicit drugs  Current medications and supplements including opioid prescriptions. Patient is not currently taking opioid prescriptions. Functional ability and status Nutritional status Physical activity Advanced directives List of other physicians Hospitalizations, surgeries, and ER visits in previous 12 months Vitals Screenings to include cognitive, depression, and falls Referrals and appointments  In addition, I have reviewed and discussed with patient certain preventive protocols, quality metrics, and best practice recommendations. A written personalized care plan for preventive services as well as general preventive health recommendations were provided to patient.   Lolita Libra, CMA   12/05/2023   After Visit Summary: (In Person-Printed) AVS printed and given to the patient  Notes: see phone note

## 2023-12-05 NOTE — Patient Instructions (Addendum)
 [name] , Thank you for taking time out of your busy schedule to complete your Annual Wellness Visit with me. I enjoyed our conversation and look forward to speaking with you again next year. I, as well as your care team,  appreciate your ongoing commitment to your health goals. Please review the following plan we discussed and let me know if I can assist you in the future. Your Game plan/ To Do List     Follow up Visits: Next Medicare AWV with our clinical staff:   12/06/24 3pm  Next Office Visit with your provider:  12/27/23 9:40  Clinician Recommendations:  Aim for 30 minutes of exercise or brisk walking, 6-8 glasses of water, and 5 servings of fruits and vegetables each day.       This is a list of the screening recommended for you and due dates:  Health Maintenance  Topic Date Due   Zoster (Shingles) Vaccine (1 of 2) Never done   Screening for Lung Cancer  11/07/2022   Medicare Annual Wellness Visit  11/16/2023   Colon Cancer Screening  10/29/2026   DTaP/Tdap/Td vaccine (3 - Td or Tdap) 10/20/2027   Hepatitis C Screening  Completed   HPV Vaccine  Aged Out   Meningitis B Vaccine  Aged Out   Pneumococcal Vaccine  Discontinued   Pneumococcal Vaccine for age over 28  Discontinued   Flu Shot  Discontinued   COVID-19 Vaccine  Discontinued    Advanced directives: (Provided) Advance directive discussed with you today. I have provided a copy for you to complete at home and have notarized. Once this is complete, please bring a copy in to our office so we can scan it into your chart.   Once completed and notarized, you may return a copy of your Advanced Directive(s) by either of the following:  Bring a copy of your health care power of attorney and living will to the office to be added to your chart at your convenience. You can mail a copy to [facility] [address] [place], [place] [address] or email to [email or web address]  Advance Care Planning is important  because it:  [x]  Makes sure you receive the medical care that is consistent with your values, goals, and preferences  [x]  It provides guidance to your family and loved ones and reduces their decisional burden about whether or not they are making the right decisions based on your wishes.  Follow the link provided in your after visit summary or read over the paperwork we have mailed to you to help you started getting your Advance Directives in place. If you need assistance in completing these, please reach out to us  so that we can help you!  See attachments for Preventive Care and Fall Prevention Tips.

## 2023-12-05 NOTE — Progress Notes (Deleted)
 Subjective:   [name] is a 68 y.o. who presents for a Medicare Wellness preventive visit.  As a reminder, Annual Wellness Visits don't include a physical exam, and some assessments may be limited, especially if this visit is performed virtually. We may recommend an in-person follow-up visit with your provider if needed.  Visit Complete: {[phone]}  {AWVVIDEO:32072}  Persons Participating in Visit: {Persons Participating in Visit:32444}  AWV Questionnaire: {AWVQuestionnaire:32338}        Objective:    Today's Vitals   12/05/23 1502  Weight: 257 lb (116.6 kg)  Height: 5' 9 (1.753 m)   Body mass index is 37.95 kg/m.     11/21/2023    9:56 AM 05/28/2023    3:08 AM 12/31/2022    2:31 PM 11/16/2022   10:40 AM 09/08/2022    2:37 AM 08/08/2022    7:34 PM 12/31/2021   12:17 PM  Advanced Directives  Does Patient Have a Medical Advance Directive? Yes No No No No No No  Type of Advance Directive Living will;Healthcare Power of Attorney        Would patient like information on creating a medical advance directive?  No - Patient declined No - Patient declined No - Patient declined   No - Patient declined    Current Medications (verified) Outpatient Encounter Medications as of 12/05/2023  Medication Sig   acetaminophen  (TYLENOL ) 500 MG tablet Take 1,000 mg by mouth every 6 (six) hours as needed for moderate pain.    albuterol  (PROVENTIL ) (2.5 MG/3ML) 0.083% nebulizer solution INHALE 3 ML BY NEBULIZATION EVERY 6 HOURS AS NEEDED FOR WHEEZING OR SHORTNESS OF BREATH   albuterol  (VENTOLIN  HFA) 108 (90 Base) MCG/ACT inhaler TAKE 2 PUFFS BY MOUTH EVERY 6 HOURS AS NEEDED FOR WHEEZE OR SHORTNESS OF BREATH   benztropine  (COGENTIN ) 0.5 MG tablet Take 1 tablet (0.5 mg total) by mouth 2 (two) times daily. As needed for side effects of Prolixin    Blood Pressure Monitoring (BLOOD PRESSURE CUFF) MISC Use daily as directed to check blood pressure   celecoxib  (CELEBREX ) 50 MG  capsule TAKE 1 CAPSULE (50 MG TOTAL) BY MOUTH 2 (TWO) TIMES DAILY AS NEEDED FOR PAIN.   cetirizine  (ZYRTEC ) 10 MG tablet Take 1 tablet (10 mg total) by mouth daily.   Cyanocobalamin  (VITAMIN B-12 PO) Take 1 tablet by mouth daily.   cyclobenzaprine  (FLEXERIL ) 10 MG tablet TAKE 1 TABLET BY MOUTH EVERYDAY AT BEDTIME   diclofenac  Sodium (VOLTAREN ) 1 % GEL Apply 2 g topically 4 (four) times daily as needed (pain).   EPINEPHrine  0.3 mg/0.3 mL IJ SOAJ injection Inject 0.3 mg into the muscle as needed for anaphylaxis.   esomeprazole  (NEXIUM ) 40 MG capsule TAKE 1 CAPSULE BY MOUTH EVERY DAY   fluPHENAZine  (PROLIXIN ) 10 MG tablet TAKE 1 TABLET BY MOUTH EVERY DAY   fluticasone  (FLONASE ) 50 MCG/ACT nasal spray Place 2 sprays into both nostrils daily.   gabapentin  (NEURONTIN ) 300 MG capsule TAKE 1 CAPSULE BY MOUTH TWICE A DAY   hydrOXYzine  (VISTARIL ) 25 MG capsule Take 25 mg by mouth every 8 (eight) hours as needed for anxiety.   potassium chloride  SA (KLOR-CON  M) 20 MEQ tablet TAKE 1 TABLET BY MOUTH EVERY DAY   rosuvastatin  (CRESTOR ) 10 MG tablet TAKE 1 TABLET BY MOUTH EVERY DAY   sildenafil  (VIAGRA ) 100 MG tablet Take 100 mg by mouth daily as needed for erectile dysfunction.   SYMBICORT  160-4.5 MCG/ACT inhaler INHALE 2 PUFFS INTO THE LUNGS IN THE MORNING AND 2 PUFFS  AT BEDTIME   tamsulosin (FLOMAX) 0.4 MG CAPS capsule Take 0.8 mg by mouth daily.   traMADol  (ULTRAM ) 50 MG tablet Take 1 tablet (50 mg total) by mouth every 6 (six) hours as needed.   traZODone  (DESYREL ) 100 MG tablet Take 0.5-1 tablets (50-100 mg total) by mouth at bedtime as needed for sleep.   valsartan  (DIOVAN ) 320 MG tablet Take 1 tablet (320 mg total) by mouth daily.   VITAMIN D  PO Take 1 tablet by mouth daily.   No facility-administered encounter medications on file as of 12/05/2023.    Allergies (verified) Iohexol, Ivp dye [iodinated contrast media], Metoclopramide hcl, Ritalin [methylphenidate], and Wellbutrin [bupropion]    History: 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 Lily   COLONOSCOPY  04/23/2010   normal   COLONOSCOPY WITH PROPOFOL   01/22/2021   poor prep[   ESOPHAGOGASTRODUODENOSCOPY N/A 12/31/2013   Procedure: ESOPHAGOGASTRODUODENOSCOPY (EGD);  Surgeon: Lupita FORBES Commander, MD;  Location: [facility];  Service: Endoscopy;  Laterality: N/A;   Excision of scalp lesion  07/18/2009   Excision of thyroid  mass     GANGLION CYST EXCISION     right foot x 2   HERNIA REPAIR     ventral   INCISIONAL HERNIA REPAIR  12/01/2011   Procedure: LAPAROSCOPIC INCISIONAL HERNIA;  Surgeon: Vicenta DELENA Poli, MD;  Location: [facility];  Service: General;  Laterality: N/A;   SHOULDER SURGERY     right   UPPER GASTROINTESTINAL ENDOSCOPY     Family History  Problem Relation Age of Onset   Hypertension Mother    Bone cancer Mother    Hypertension Father    Kidney disease Father    Prostate cancer Brother    Bone cancer  Brother    Kidney disease Brother    Mental illness Maternal Grandmother    Mental illness Son    Prostate cancer Other    Colon cancer Neg Hx    Colon polyps Neg Hx    Esophageal cancer Neg Hx    Rectal cancer Neg Hx    Stomach cancer Neg Hx    Social History   Socioeconomic History   Marital status: Married    Spouse name: Doyal   Number of children: 2   Years of education: Not on file   Highest education level: 11th grade  Occupational History   Occupation: Disabled    Employer: DISABLED  Tobacco Use   Smoking status:  Every Day    Current packs/day: 1.50    Average packs/day: 1.5 packs/day for 54.6 years (81.9 ttl pk-yrs)    Types: Cigarettes    Start date: 1971    Passive exposure: Never   Smokeless tobacco: Never   Tobacco comments:    Started smoking at age 40,still smoking pack - 1-1/2 packs  per day 01/08/2021  Vaping Use   Vaping status: Never Used  Substance and Sexual Activity   Alcohol use: No   Drug use: Yes    Frequency: 1.0 times per week    Types: Marijuana   Sexual activity: Yes    Partners: Female  Other Topics Concern   Not on file  Social History Narrative   HSG disabled due to schizophrenia - 1983. stable long-term marriage with children. cared for his father-in-law- passed away in 2023-12-04   Social Drivers of Health   Financial Resource Strain: Low Risk  (11/16/2022)   Overall Financial Resource Strain (CARDIA)    Difficulty of Paying Living Expenses: Not hard at all  Food Insecurity: No Food Insecurity (11/16/2022)   Hunger Vital Sign    Worried About Running Out of Food in the Last Year: Never true    Ran Out of Food in the Last Year: Never true  Transportation Needs: No Transportation Needs (11/16/2022)   PRAPARE - Administrator, Civil Service (Medical): No    Lack of Transportation (Non-Medical): No  Physical Activity: Inactive (11/16/2022)   Exercise Vital Sign    Days of Exercise per Week: 0 days    Minutes of Exercise per  Session: 0 min  Stress: No Stress Concern Present (11/16/2022)   [employer] of Occupational Health - Occupational Stress Questionnaire    Feeling of Stress : Only a little  Social Connections: Moderately Isolated (11/16/2022)   Social Connection and Isolation Panel    Frequency of Communication with Friends and Family: More than three times a week    Frequency of Social Gatherings with Friends and Family: Twice a week    Attends Religious Services: Never    Database administrator or Organizations: No    Attends Engineer, structural: Never    Marital Status: Married    Tobacco Counseling Ready to quit: Not Answered Counseling given: Not Answered Tobacco comments: Started smoking at age 70,still smoking pack - 1-1/2 packs  per day 01/08/2021    Clinical Intake:              Lab Results  Component Value Date   HGBA1C 6.2 11/18/2023   HGBA1C 5.9 (H) 08/20/2022   HGBA1C 5.7 08/01/2014               Activities of Daily Living ***     No data to display          Patient Care Team: Jason Leita Repine, FNP as PCP - General (Internal Medicine) Onita Duos, MD as Consulting Physician (Neurology) Eappen, Saramma, MD as Consulting Physician (Psychiatry) Shari Easter, MD as Consulting Physician (Orthopedic Surgery) Danis, Victory LITTIE MOULD, MD as Consulting Physician (Gastroenterology) *** I have updated your Care Teams any recent Medical Services you may have received from other providers in the past year.     Assessment:   This is a routine wellness examination for [name].  Hearing/Vision screen No results found.   Goals Addressed   None    Depression Screen ***    09/19/2023    9:02 AM 06/16/2023  9:18 AM 03/16/2023   10:18 AM 12/14/2022    3:41 PM 12/14/2022    2:17 PM 11/16/2022   10:43 AM 08/20/2022    2:52 PM  PHQ 2/9 Scores  PHQ - 2 Score      0 0  PHQ- 9 Score      0 0     Information is confidential and restricted. Go to Review  Flowsheets to unlock data.    Fall Risk ***    11/16/2022   10:46 AM 08/20/2022    2:52 PM 08/04/2021    1:46 PM 03/10/2021    2:44 PM 11/20/2020    1:36 PM  Fall Risk   Falls in the past year? 1 1 1 1  0  Number falls in past yr: 1 1 1  0 0  Injury with Fall? 1 1 1 1  0  Risk for fall due to : Impaired balance/gait;History of fall(s) History of fall(s) History of fall(s) Impaired balance/gait;History of fall(s) No Fall Risks  Follow up Falls prevention discussed;Education provided;Falls evaluation completed Falls evaluation completed Falls evaluation completed  Falls evaluation completed;Falls prevention discussed  Falls evaluation completed      Data saved with a previous flowsheet row definition    MEDICARE RISK AT HOME: ***    TIMED UP AND GO:  Was the test performed?  {[phone]}  Cognitive Function: {CognitiveScreening:32337}        11/16/2022   10:49 AM 08/04/2021    1:57 PM  6CIT Screen  What Year? 0 points 0 points  What month? 0 points 0 points  What time? 0 points 0 points  Count back from 20 0 points 0 points  Months in reverse 4 points 4 points  Repeat phrase 0 points 2 points  Total Score 4 points 6 points    Immunizations Immunization History  Administered Date(s) Administered   Janssen (J&J) SARS-COV-2 Vaccination 06/29/2019   PFIZER Comirnaty(Gray Top)Covid-19 Tri-Sucrose Vaccine 08/05/2020   Pneumococcal Conjugate-13 10/19/2017   Td 04/19/1992   Tdap 10/19/2017    Screening Tests Health Maintenance  Topic Date Due   Zoster Vaccines- Shingrix (1 of 2) Never done   Lung Cancer Screening  11/07/2022   Medicare Annual Wellness (AWV)  11/16/2023   Colonoscopy  10/29/2026   DTaP/Tdap/Td (3 - Td or Tdap) 10/20/2027   Hepatitis C Screening  Completed   HPV VACCINES  Aged Out   Meningococcal B Vaccine  Aged Out   Pneumococcal Vaccine  Discontinued   Pneumococcal Vaccine: 50+ Years  Discontinued   INFLUENZA VACCINE  Discontinued    COVID-19 Vaccine  Discontinued    Health Maintenance  Health Maintenance Due  Topic Date Due   Zoster Vaccines- Shingrix (1 of 2) Never done   Lung Cancer Screening  11/07/2022   Medicare Annual Wellness (AWV)  11/16/2023   Health Maintenance Items Addressed: {HMMCR (Optional):30011}  Additional Screening:  Vision Screening: Recommended annual ophthalmology exams for early detection of glaucoma and other disorders of the eye. Would you like a referral to an eye doctor? {YES/NO:21197}   Dental Screening: Recommended annual dental exams for proper oral hygiene  Community Resource Referral / Chronic Care Management: CRR required this visit?  {YES/NO:21197}  CCM required this visit?  {CCM Required [phone]}   Plan:    I have personally reviewed and noted the following in the patient's chart:   Medical and social history Use of alcohol, tobacco or illicit drugs  Current medications and supplements including opioid prescriptions. {Opioid Prescriptions:8037733309} Functional ability  and status Nutritional status Physical activity Advanced directives List of other physicians Hospitalizations, surgeries, and ER visits in previous 12 months Vitals Screenings to include cognitive, depression, and falls Referrals and appointments  In addition, I have reviewed and discussed with patient certain preventive protocols, quality metrics, and best practice recommendations. A written personalized care plan for preventive services as well as general preventive health recommendations were provided to patient.   Lolita Libra, CMA   12/05/2023   After Visit Summary: {CHL AMB AWV After Visit Summary:[phone]}  Notes: {Nurse Notes:32343}

## 2023-12-05 NOTE — Telephone Encounter (Signed)
 Pt had AWV today. Has bilateral ankle swelling. Wife and pt tell me that PCP had stopped a blood pressure medication to see if that was causing swelling. Swelling persists despite stopping medication (amlodipine ). Pt also states he was told to get compression stockings and he has not yet. He says he will get some soon and see if this helps the swelling.  He has follow up with PCP on 12/27/23. I advised wife to call us  if swelling worsens between now and then and she voices understanding. Please advise if any other recommendations?

## 2023-12-06 ENCOUNTER — Ambulatory Visit
Admission: RE | Admit: 2023-12-06 | Discharge: 2023-12-06 | Disposition: A | Discharge status: Home / Self Care | Source: Ambulatory Visit | Attending: Acute Care | Admitting: Acute Care

## 2023-12-06 DIAGNOSIS — F17210 Nicotine dependence, cigarettes, uncomplicated: Secondary | ICD-10-CM

## 2023-12-06 DIAGNOSIS — Z122 Encounter for screening for malignant neoplasm of respiratory organs: Secondary | ICD-10-CM

## 2023-12-06 DIAGNOSIS — Z87891 Personal history of nicotine dependence: Secondary | ICD-10-CM

## 2023-12-12 ENCOUNTER — Other Ambulatory Visit: Payer: Self-pay | Admitting: Family

## 2023-12-20 ENCOUNTER — Telehealth: Payer: Self-pay | Admitting: *Deleted

## 2023-12-20 NOTE — Telephone Encounter (Signed)
 Call report from [facility]:  IMPRESSION: 1. Lung-RADS 3, probably benign findings. Short-term follow-up in 6 months is recommended with repeat low-dose chest CT without contrast (please use the following order, CT CHEST LCS NODULE FOLLOW-UP W/O CM). New indistinct 4.7 mm posterior right upper lobe pulmonary nodule. 2. Three-vessel coronary atherosclerosis. 3. Trace layering bilateral pleural effusions. 4. Aortic Atherosclerosis (ICD10-I70.0) and Emphysema (ICD10-J43.9).

## 2023-12-21 ENCOUNTER — Other Ambulatory Visit: Payer: Self-pay | Admitting: Psychiatry

## 2023-12-21 DIAGNOSIS — G4701 Insomnia due to medical condition: Secondary | ICD-10-CM

## 2023-12-21 DIAGNOSIS — F203 Undifferentiated schizophrenia: Secondary | ICD-10-CM

## 2023-12-21 DIAGNOSIS — F418 Other specified anxiety disorders: Secondary | ICD-10-CM

## 2023-12-23 ENCOUNTER — Telehealth: Payer: Self-pay | Admitting: Acute Care

## 2023-12-23 ENCOUNTER — Other Ambulatory Visit: Payer: Self-pay

## 2023-12-23 DIAGNOSIS — Z87891 Personal history of nicotine dependence: Secondary | ICD-10-CM

## 2023-12-23 DIAGNOSIS — R911 Solitary pulmonary nodule: Secondary | ICD-10-CM

## 2023-12-23 DIAGNOSIS — Z122 Encounter for screening for malignant neoplasm of respiratory organs: Secondary | ICD-10-CM

## 2023-12-23 DIAGNOSIS — F17210 Nicotine dependence, cigarettes, uncomplicated: Secondary | ICD-10-CM

## 2023-12-23 NOTE — Telephone Encounter (Signed)
 7.8 mm right upper lobe pulmonary nodule resolved from previous scan.  New 4.7 mm nodule, layering pleural effusions, will need a 58-month follow-up. Ladies I am not sure while why he has pleural effusions.  Patient has not followed up with his cardiologist in over a year.  Please encourage him to follow-up with his cardiologist to evaluate for heart failure. He was recently in the ED 11/21/2023 with chest pain.  It resolved with Maalox so they suspect it may have been related to GERD.  However he does need to follow-up with his cardiologist. Please fax results to PCP let her know plan is for 56-month follow-up low-dose CT to reevaluate the new 4.7 mm nodule . Patient may need a chest x-ray to reevaluate to see if there is been resolution of the pleural effusions.  He had been seen in our office by Dr. Kassie greater than a year ago.  The office called him to get him in sooner in August 2025 but he has not made an appointment.

## 2023-12-23 NOTE — Telephone Encounter (Signed)
 See provider note from 12/23/2023

## 2023-12-23 NOTE — Telephone Encounter (Signed)
 Spoke with patient and reviewed recent Lung CT results. He is in agreement to complete a 6 month follow up scan due around 06/08/2024, he request a call back closer to the due date for scheduling. Order has been placed. He has an appt with his PCP 9/9/20206 and will see Cardiology 01/12/2025.   Dr. Floretta and Leita, we will forward recent results to your office per patient request. Please let us  know if you need additional assistance with this patient.   Isaiah, RN

## 2023-12-27 ENCOUNTER — Ambulatory Visit: Admitting: Family

## 2023-12-30 ENCOUNTER — Encounter: Payer: Self-pay | Admitting: Family

## 2023-12-30 ENCOUNTER — Ambulatory Visit: Admitting: Family

## 2023-12-30 ENCOUNTER — Ambulatory Visit (INDEPENDENT_AMBULATORY_CARE_PROVIDER_SITE_OTHER): Admitting: Family

## 2023-12-30 ENCOUNTER — Ambulatory Visit: Payer: Self-pay | Admitting: Family

## 2023-12-30 VITALS — BP 138/88 | HR 88 | Ht 69.0 in | Wt 264.4 lb

## 2023-12-30 DIAGNOSIS — R600 Localized edema: Secondary | ICD-10-CM | POA: Diagnosis not present

## 2023-12-30 DIAGNOSIS — I10 Essential (primary) hypertension: Secondary | ICD-10-CM | POA: Diagnosis not present

## 2023-12-30 LAB — COMPREHENSIVE METABOLIC PANEL WITH GFR
ALT: 8 U/L (ref 0–53)
AST: 11 U/L (ref 0–37)
Albumin: 3.8 g/dL (ref 3.5–5.2)
Alkaline Phosphatase: 53 U/L (ref 39–117)
BUN: 5 mg/dL — ABNORMAL LOW (ref 6–23)
CO2: 35 meq/L — ABNORMAL HIGH (ref 19–32)
Calcium: 9.2 mg/dL (ref 8.4–10.5)
Chloride: 101 meq/L (ref 96–112)
Creatinine, Ser: 1.29 mg/dL (ref 0.40–1.50)
GFR: 57.66 mL/min — ABNORMAL LOW (ref >60.00)
Glucose, Bld: 91 mg/dL (ref 70–99)
Potassium: 3.7 meq/L (ref 3.5–5.1)
Sodium: 142 meq/L (ref 135–145)
Total Bilirubin: 0.3 mg/dL (ref 0.2–1.2)
Total Protein: 6.1 g/dL (ref 6.0–8.3)

## 2023-12-30 LAB — BRAIN NATRIURETIC PEPTIDE: Pro B Natriuretic peptide (BNP): 44 pg/mL (ref 0.0–100.0)

## 2023-12-30 MED ORDER — FUROSEMIDE 20 MG PO TABS
ORAL_TABLET | ORAL | 1 refills | Status: DC
Start: 2023-12-30 — End: 2024-01-14

## 2023-12-30 NOTE — Progress Notes (Signed)
 [name] is a 68 y.o. male with the following history as recorded in [employer]:  Patient Active Problem List   Diagnosis Date Noted   Spinal stenosis of lumbar region 06/08/2023   Insomnia 12/14/2022   Carpal tunnel syndrome of right wrist 10/29/2022   Ulnar neuropathy of right upper extremity 10/22/2022   Paresthesia 08/26/2021   Ulnar neuropathy at elbow, left 08/26/2021   Gait abnormality 07/14/2021   Chronic bilateral low back pain with bilateral sciatica 07/14/2021   Neck pain 07/14/2021   Left hand weakness 07/14/2021   Trigger finger, right middle finger 06/02/2021   Coronary artery calcification 08/15/2020   Nodule of apex of right lung 05/29/2020   Malignant neoplasm of prostate (HCC) 10/26/2019   Body mass index (BMI) 25.0-25.9, adult 10/25/2019   Spondylosis of cervical region without myelopathy or radiculopathy 09/26/2019   Numbness of hand 06/22/2019   Ulnar neuropathy at elbow of left upper extremity 06/05/2019   Chronic obstructive pulmonary disease (HCC) 10/24/2018   Cervical radiculopathy at C8 01/18/2018   Right lateral epicondylitis 12/21/2017   Pain in joint, shoulder region 04/23/2015   Periumbilical abdominal pain 02/13/2015   Elevated PSA 08/04/2014   Subacromial bursitis 04/11/2014   Neck pain on right side 03/05/2014   Bilateral shoulder pain 03/05/2014   Recurrent boils 03/05/2014   Foreign body in stomach 12/31/2013   Reflux esophagitis 12/31/2013   Weight loss 05/06/2012   Incisional hernia 11/23/2011   Cramp of limb 01/13/2011   Hyperthyroidism 08/11/2010   Sebaceous cyst 08/10/2010   HYPERTROPHY PROSTATE W/UR OBST & OTH LUTS 02/27/2010   Tobacco use disorder 08/05/2009   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      albuterol  (PROVENTIL ) (2.5 MG/3ML) 0.083% nebulizer solution INHALE 3 ML BY NEBULIZATION EVERY 6 HOURS AS NEEDED FOR WHEEZING OR SHORTNESS OF BREATH 150 mL 0   albuterol  (VENTOLIN  HFA) 108 (90 Base) MCG/ACT inhaler TAKE 2 PUFFS BY MOUTH EVERY 6 HOURS AS NEEDED FOR WHEEZE OR SHORTNESS OF BREATH 8.5 each 6   benztropine  (COGENTIN ) 0.5 MG tablet Take 1 tablet (0.5 mg total) by mouth 2 (two) times daily. As needed for side effects of Prolixin  180 tablet 1   Blood Pressure Monitoring (BLOOD PRESSURE CUFF) MISC Use daily as directed to check blood pressure 1 each 0   celecoxib  (CELEBREX ) 50 MG capsule TAKE 1 CAPSULE (50 MG TOTAL) BY MOUTH 2 (TWO) TIMES DAILY AS NEEDED FOR PAIN. 60 capsule 6   cetirizine  (ZYRTEC ) 10 MG tablet Take 1 tablet (10 mg total) by mouth daily. 30 tablet 11   Cyanocobalamin  (VITAMIN B-12 PO) Take 1 tablet by mouth daily.     cyclobenzaprine  (FLEXERIL ) 10 MG tablet TAKE 1 TABLET BY MOUTH EVERYDAY AT BEDTIME 30 tablet 1   diclofenac  Sodium (VOLTAREN ) 1 % GEL Apply 2 g topically 4 (four) times daily as needed (pain). 150 g 0   EPINEPHrine  0.3 mg/0.3 mL IJ SOAJ injection Inject 0.3 mg  into the muscle as needed for anaphylaxis. 1 each 1   esomeprazole  (NEXIUM ) 40 MG capsule TAKE 1 CAPSULE BY MOUTH EVERY DAY 90 capsule 3   fluPHENAZine  (PROLIXIN ) 10 MG tablet TAKE 1 TABLET BY MOUTH EVERY DAY 90 tablet 1   fluticasone  (FLONASE ) 50 MCG/ACT nasal spray Place 2 sprays into both nostrils daily. 16 g 6   furosemide  (LASIX ) 20 MG tablet Take 1 tablet daily as directed; may take 2nd pill in as needed for edema 60 tablet 1   gabapentin  (NEURONTIN ) 300 MG  capsule TAKE 1 CAPSULE BY MOUTH TWICE A DAY 180 capsule 3   hydrOXYzine  (VISTARIL ) 25 MG capsule Take 25 mg by mouth every 8 (eight) hours as needed for anxiety.     potassium chloride  SA (KLOR-CON  M) 20 MEQ tablet TAKE 1 TABLET BY MOUTH EVERY DAY 90 tablet 3   rosuvastatin  (CRESTOR ) 10 MG tablet TAKE 1 TABLET BY MOUTH EVERY DAY 90 tablet 3   sildenafil  (VIAGRA ) 100 MG tablet Take 100 mg by mouth daily as needed for erectile dysfunction.     SYMBICORT  160-4.5 MCG/ACT inhaler INHALE 2 PUFFS INTO THE LUNGS IN THE MORNING AND 2 PUFFS AT BEDTIME 30.6 each 1   tamsulosin (FLOMAX) 0.4 MG CAPS capsule Take 0.8 mg by mouth daily.     traMADol  (ULTRAM ) 50 MG tablet Take 1 tablet (50 mg total) by mouth every 6 (six) hours as needed. 15 tablet 0   traZODone  (DESYREL ) 100 MG tablet TAKE 1/2 TO 1 TABLET (50-100 MG TOTAL) BY MOUTH AT BEDTIME AS NEEDED FOR SLEEP 90 tablet 3   valsartan  (DIOVAN ) 320 MG tablet Take 1 tablet (320 mg total) by mouth daily. 90 tablet 1   VITAMIN D  PO Take 1 tablet by mouth daily.     No current facility-administered medications for this visit.    Allergies: Iohexol, Ivp dye [iodinated contrast media], Metoclopramide hcl, Ritalin [methylphenidate], and Wellbutrin [bupropion]  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   LOW BACK PAIN, CHRONIC 11/08/2007   Pancreatitis    Prostate cancer  (HCC)    Rectal abscess    SCHIZOPHRENIA NEC, 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 Lily   COLONOSCOPY  04/23/2010   normal   COLONOSCOPY WITH PROPOFOL   01/22/2021   poor prep[   ESOPHAGOGASTRODUODENOSCOPY N/A 12/31/2013   Procedure: ESOPHAGOGASTRODUODENOSCOPY (EGD);  Surgeon: Lupita FORBES Commander, MD;  Location: [facility];  Service: Endoscopy;  Laterality: N/A;   Excision of scalp lesion  07/18/2009   Excision of thyroid  mass     GANGLION CYST EXCISION     right foot x 2  HERNIA REPAIR     ventral   INCISIONAL HERNIA REPAIR  12/01/2011   Procedure: LAPAROSCOPIC INCISIONAL HERNIA;  Surgeon: Vicenta DELENA Poli, MD;  Location: [facility];  Service: General;  Laterality: N/A;   SHOULDER SURGERY     right   UPPER GASTROINTESTINAL ENDOSCOPY      Family History  Problem Relation Age of Onset   Hypertension Mother    Bone cancer Mother    Hypertension Father    Kidney disease Father    Prostate cancer Brother    Bone cancer Brother    Kidney disease Brother    Mental illness Maternal Grandmother    Mental illness Son    Prostate cancer Other    Colon cancer Neg Hx    Colon polyps Neg Hx    Esophageal cancer Neg Hx    Rectal cancer Neg Hx    Stomach cancer Neg Hx     Social History   Tobacco Use   Smoking status: Every Day    Current packs/day: 1.50    Average packs/day: 1.5 packs/day for 54.7 years (82.0 ttl pk-yrs)    Types: Cigarettes    Start date: 1971    Passive exposure: Never   Smokeless tobacco: Never   Tobacco comments:    Started smoking at age 84,still smoking pack - 1-1/2 packs  per day 01/08/2021  Substance Use Topics   Alcohol use: No    Subjective:   Follow up on hypertension; at last OV, Amlodipine  was  discontinued to hopefully help resolve pedal edema; patient notes he is continuing to feel very swollen; is taking Valsartan  daily; Denies any chest pain, shortness of breath, blurred vision or headache   Objective:  Vitals:   12/30/23 1111  BP: 138/88  Pulse: 88  SpO2: 94%  Weight: 264 lb 6.4 oz (119.9 kg)  Height: 5' 9 (1.753 m)    General: Well developed, well nourished, in no acute distress  Skin : Warm and dry.  Head: Normocephalic and atraumatic  Eyes: Sclera and conjunctiva clear; pupils round and reactive to light; extraocular movements intact  Ears: External normal; canals clear; tympanic membranes normal  Oropharynx: Pink, supple. No suspicious lesions  Neck: Supple without thyromegaly, adenopathy  Lungs: Respirations unlabored; wheezing noted in all 4 lobes CVS exam: normal rate and regular rhythm.  Vessels: Symmetric bilaterally  Neurologic: Alert and oriented; speech intact; face symmetrical; moves all extremities well; CNII-XII intact without focal deficit   Assessment:  1. Pedal edema   2. Primary hypertension     Plan:  Check CMP, BNP today; add Lasix  20 mg daily- can take 2nd dose daily as needed for swelling; continue Valsartan ; he is scheduled to see cardiology in 2 weeks;  Stressed need to quit smoking!  Patient is aware that he needs to schedule with new PCP;   No follow-ups on file.  Orders Placed This Encounter  Procedures   B Nat Peptide   Comp Met (CMET)    Requested Prescriptions   Signed Prescriptions Disp Refills   furosemide  (LASIX ) 20 MG tablet 60 tablet 1    Sig: Take 1 tablet daily as directed; may take 2nd pill in as needed for edema

## 2024-01-03 DIAGNOSIS — M25512 Pain in left shoulder: Secondary | ICD-10-CM | POA: Diagnosis not present

## 2024-01-04 ENCOUNTER — Other Ambulatory Visit: Payer: Self-pay | Admitting: Family

## 2024-01-09 ENCOUNTER — Telehealth: Payer: Self-pay

## 2024-01-09 DIAGNOSIS — F418 Other specified anxiety disorders: Secondary | ICD-10-CM

## 2024-01-09 MED ORDER — HYDROXYZINE PAMOATE 25 MG PO CAPS: 25.0000 mg | ORAL_CAPSULE | Freq: Three times a day (TID) | ORAL | 1 refills | Status: DC | PRN

## 2024-01-09 NOTE — Telephone Encounter (Signed)
I have sent hydroxyzine to pharmacy as requested 

## 2024-01-09 NOTE — Addendum Note (Signed)
 Addended byBETHA COBY HEIGHT on: 01/09/2024 04:27 PM   Modules accepted: Orders

## 2024-01-09 NOTE — Telephone Encounter (Signed)
 Received fax from patients pharmacy requesting a refill of hydrOXYzine  (VISTARIL ) 25 MG capsule    Last visit 12-21-23 Next visit 01-19-24   Preferred Pharmacies   [id number] [address], [place] - [address] Phone: [phone]  Fax: [phone]

## 2024-01-12 NOTE — H&P (View-Only) (Signed)
 Cardiology Office Note:   Date:  01/12/2024  ID:  [name], DOB January 09, 1956, MRN [id number] PCP: Jason Leita Repine, FNP  [facility] Providers Cardiologist:  Georganna Archer, MD { Chief Complaint:  Chief Complaint  Patient presents with   Chest Pain      History of Present Illness:   [name] is a 68 y.o. male with a PMH of HTN, HLD, CKD, tobacco use disorder, COPD, schizophrenia who presents as a new patient referral by Dr. Fonda Law for the evaluation of chest pain.  Patient presented to the ED on 8//25 with a chief complaint of chest pain.  Troponins were negative x2.  His ECG was negative for ischemic changes.  CXR was WNL.  He was discharged and referred to cardiology for further evaluation.  Of note the patient underwent CT lung cancer screening on 12/18/2023 which incidentally revealed severe three-vessel CAC and aortic calcifications.  Today the patient reports that he woke up with substernal chest pain rating down his left arm.  Previously, his chest pain would come and go but he says it is never happened at rest.  He rates it 7/10 in intensity and endorses associated SOB.  My meeting with the patient was cut short due to calling EMS for transportation to the ED given unstable angina.   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   LOW BACK PAIN, CHRONIC  11/08/2007   Pancreatitis    Prostate cancer (HCC)    Rectal abscess    SCHIZOPHRENIA NEC, CHRONIC 01/25/2007   SCOLIOSIS NEC 01/25/2007   Sebaceous cyst 08/10/2010   SMOKER 08/05/2009   UPPER GASTROINTESTINAL HEMORRHAGE 01/25/2007     Studies Reviewed:    EKG:  EKG Interpretation Date/Time:  Friday January 13 2024 08:32:37 EDT Ventricular Rate:  87 PR Interval:  170 QRS Duration:  84 QT Interval:  380 QTC Calculation: 457 R Axis:   -9  Text Interpretation: Sinus rhythm with frequent Premature ventricular complexes Nonspecific T wave abnormality When compared with ECG of 21-Nov-2023 09:57, PREVIOUS ECG IS PRESENT Confirmed by Archer Georganna [phone]) on 01/13/2024 9:17:20 AM     Cardiac Studies & Procedures   ______________________________________________________________________________________________   STRESS TESTS  MYOCARDIAL PERFUSION IMAGING 11/03/2020  Interpretation Summary  The left ventricular ejection fraction is mildly decreased (45-54%).  Nuclear stress EF: 54%.  There was no ST segment deviation noted during stress.  There is a small defect of mild severity present in the apex location. The defect is non-reversible. In the setting of normal LVF this likely represents attenuation artifact.  This is a low risk study.            ______________________________________________________________________________________________      Risk Assessment/Calculations:              Physical Exam:     VS:  BP 136/88  Pulse 87   Ht 5' 8.5 (1.74 m)   Wt 259 lb 9.6 oz (117.8 kg)   SpO2 91%   BMI 38.90 kg/m      Wt Readings from Last 3 Encounters:  12/30/23 264 lb 6.4 oz (119.9 kg)  12/05/23 257 lb (116.6 kg)  11/21/23 252 lb (114.3 kg)     GEN: Sitting holding his chest in moderate distress CV: RRR PULM: CTAB EXT: 1+ pitting edema Assessment & Plan Unstable angina [facility]) Patient presented to clinic with unstable angina.  He is high risk  for having obstructive CAD given HTN, HLD, active tobacco use, prediabetes, and obesity.  He has severe 3V CAC on his most recent CT scan which further puts him at risk for having obstructive CAD.  I recommend that the patient be brought to the ED via EMS for an expedited cardiac evaluation.  I notified both the cardiology team and the ED team of his pending arrival.  I recommend that the patient undergo LHC for definitive coronary assessment.  Please load the patient with aspirin  325 mg ASAP. -Called EMS for ED assessment - Please load 325 mg aspirin  x1 -Please check troponins in the ED - Please consult cardiology once patient has arrived and can stabilize - I strongly recommend LHC for coronary assessment given his risk profile - Additional recommendations per inpatient cardiology team        This note was written with the assistance of a dictation microphone or AI dictation software. Please excuse any typos or grammatical errors.   Signed, Georganna Archer, MD 01/12/2024 2:21 PM    [facility]

## 2024-01-12 NOTE — Progress Notes (Incomplete)
 Cardiology Office Note:   Date:  01/12/2024  ID:  [name], DOB 06/21/1955, MRN [id number] PCP: Jason Leita Repine, FNP   [facility] Providers Cardiologist:  None { Chief Complaint: No chief complaint on file.     History of Present Illness:   [name] is a 68 y.o. male with a PMH of HTN, HLD, CKD, tobacco use disorder, COPD, schizophrenia who presents as a new patient referral by Dr. Fonda Law for the evaluation of chest pain.  Patient presented to the ED on 8//25 with a chief complaint of chest pain.  Troponins were negative x2.  His ECG was negative for ischemic changes.  CXR was WNL.  He was discharged and referred to cardiology for further evaluation.  Of note the patient underwent CT lung cancer screening on 12/18/2023 which incidentally revealed severe three-vessel CAC and aortic calcifications.     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   LOW BACK PAIN, CHRONIC 11/08/2007   Pancreatitis    Prostate cancer (HCC)    Rectal abscess    SCHIZOPHRENIA NEC, CHRONIC 01/25/2007   SCOLIOSIS NEC 01/25/2007   Sebaceous cyst 08/10/2010   SMOKER 08/05/2009   UPPER GASTROINTESTINAL HEMORRHAGE 01/25/2007     Studies Reviewed:    EKG: ***       Cardiac Studies & Procedures    ______________________________________________________________________________________________   STRESS TESTS  MYOCARDIAL PERFUSION IMAGING 11/03/2020  Interpretation Summary  The left ventricular ejection fraction is mildly decreased (45-54%).  Nuclear stress EF: 54%.  There was no ST segment deviation noted during stress.  There is a small defect of mild severity present in the apex location. The defect is non-reversible. In the setting of normal LVF this likely represents attenuation artifact.  This is a low risk study.            ______________________________________________________________________________________________      Risk Assessment/Calculations:   {Does this patient have ATRIAL FIBRILLATION?[phone]} No BP recorded.  {Refresh Note OR Click here to enter BP  :1}***        Physical Exam:     VS:  There were no vitals taken for this visit. ***    Wt Readings from Last 3 Encounters:  12/30/23 264 lb 6.4 oz (119.9 kg)  12/05/23 257 lb (116.6 kg)  11/21/23 252 lb (114.3 kg)     GEN: Well nourished, well developed, in no acute distress NECK: No JVD; No carotid bruits CARDIAC: ***RRR, no murmurs, rubs, gallops RESPIRATORY:  Clear to auscultation without rales, wheezing or rhonchi  ABDOMEN: Soft, non-tender, non-distended, normal bowel sounds EXTREMITIES:  Warm and well perfused, no edema; No deformity, 2+ radial pulses PSYCH: Normal mood and affect   Assessment & Plan       {Are you ordering a CV Procedure (e.g. stress test, cath, DCCV, TEE, etc)?   Press F2        :  [id number]}   This note was written with the assistance of a dictation microphone or AI dictation software. Please excuse any typos or grammatical errors.   Signed, Georganna Archer, MD 01/12/2024 2:21 PM    [facility]

## 2024-01-13 ENCOUNTER — Emergency Department (HOSPITAL_COMMUNITY)

## 2024-01-13 ENCOUNTER — Observation Stay (HOSPITAL_COMMUNITY)
Admission: EM | Admit: 2024-01-13 | Discharge: 2024-01-14 | Disposition: A | Discharge status: Home / Self Care | Attending: Internal Medicine | Admitting: Internal Medicine

## 2024-01-13 ENCOUNTER — Encounter: Payer: Self-pay | Admitting: Student in an Organized Health Care Education/Training Program

## 2024-01-13 ENCOUNTER — Encounter (HOSPITAL_COMMUNITY): Admission: EM | Disposition: A | Discharge status: Home / Self Care | Payer: Self-pay | Source: Home / Self Care

## 2024-01-13 ENCOUNTER — Other Ambulatory Visit: Payer: Self-pay

## 2024-01-13 ENCOUNTER — Ambulatory Visit
Attending: Student in an Organized Health Care Education/Training Program | Admitting: Student in an Organized Health Care Education/Training Program

## 2024-01-13 ENCOUNTER — Encounter (HOSPITAL_COMMUNITY): Payer: Self-pay

## 2024-01-13 VITALS — BP 136/88 | HR 87 | Ht 68.5 in | Wt 259.6 lb

## 2024-01-13 DIAGNOSIS — E785 Hyperlipidemia, unspecified: Secondary | ICD-10-CM | POA: Diagnosis not present

## 2024-01-13 DIAGNOSIS — F209 Schizophrenia, unspecified: Secondary | ICD-10-CM | POA: Diagnosis present

## 2024-01-13 DIAGNOSIS — M51362 Other intervertebral disc degeneration, lumbar region with discogenic back pain and lower extremity pain: Secondary | ICD-10-CM | POA: Insufficient documentation

## 2024-01-13 DIAGNOSIS — F17210 Nicotine dependence, cigarettes, uncomplicated: Secondary | ICD-10-CM | POA: Insufficient documentation

## 2024-01-13 DIAGNOSIS — C61 Malignant neoplasm of prostate: Secondary | ICD-10-CM | POA: Insufficient documentation

## 2024-01-13 DIAGNOSIS — Z79899 Other long term (current) drug therapy: Secondary | ICD-10-CM | POA: Diagnosis not present

## 2024-01-13 DIAGNOSIS — Z7982 Long term (current) use of aspirin: Secondary | ICD-10-CM | POA: Diagnosis not present

## 2024-01-13 DIAGNOSIS — N183 Chronic kidney disease, stage 3 unspecified: Secondary | ICD-10-CM | POA: Insufficient documentation

## 2024-01-13 DIAGNOSIS — I200 Unstable angina: Secondary | ICD-10-CM | POA: Diagnosis not present

## 2024-01-13 DIAGNOSIS — J449 Chronic obstructive pulmonary disease, unspecified: Secondary | ICD-10-CM | POA: Insufficient documentation

## 2024-01-13 DIAGNOSIS — R079 Chest pain, unspecified: Secondary | ICD-10-CM | POA: Diagnosis not present

## 2024-01-13 DIAGNOSIS — N400 Enlarged prostate without lower urinary tract symptoms: Secondary | ICD-10-CM | POA: Insufficient documentation

## 2024-01-13 DIAGNOSIS — I129 Hypertensive chronic kidney disease with stage 1 through stage 4 chronic kidney disease, or unspecified chronic kidney disease: Secondary | ICD-10-CM | POA: Insufficient documentation

## 2024-01-13 DIAGNOSIS — I25110 Atherosclerotic heart disease of native coronary artery with unstable angina pectoris: Principal | ICD-10-CM | POA: Insufficient documentation

## 2024-01-13 DIAGNOSIS — R7303 Prediabetes: Secondary | ICD-10-CM | POA: Diagnosis not present

## 2024-01-13 DIAGNOSIS — M545 Low back pain, unspecified: Secondary | ICD-10-CM | POA: Diagnosis present

## 2024-01-13 DIAGNOSIS — R2243 Localized swelling, mass and lump, lower limb, bilateral: Secondary | ICD-10-CM | POA: Diagnosis not present

## 2024-01-13 DIAGNOSIS — F1290 Cannabis use, unspecified, uncomplicated: Secondary | ICD-10-CM | POA: Insufficient documentation

## 2024-01-13 DIAGNOSIS — M1380 Other specified arthritis, unspecified site: Secondary | ICD-10-CM | POA: Diagnosis not present

## 2024-01-13 DIAGNOSIS — I700 Atherosclerosis of aorta: Secondary | ICD-10-CM | POA: Diagnosis not present

## 2024-01-13 DIAGNOSIS — R0789 Other chest pain: Secondary | ICD-10-CM | POA: Diagnosis not present

## 2024-01-13 HISTORY — PX: LEFT HEART CATH AND CORONARY ANGIOGRAPHY: CATH118249

## 2024-01-13 LAB — BASIC METABOLIC PANEL WITH GFR
Anion gap: 8 (ref 5–15)
BUN: 5 mg/dL — ABNORMAL LOW (ref 8–23)
CO2: 30 mmol/L (ref 22–32)
Calcium: 8.8 mg/dL — ABNORMAL LOW (ref 8.9–10.3)
Chloride: 102 mmol/L (ref 98–111)
Creatinine, Ser: 1.44 mg/dL — ABNORMAL HIGH (ref 0.61–1.24)
GFR, Estimated: 54 mL/min — ABNORMAL LOW (ref >60)
Glucose, Bld: 96 mg/dL (ref 70–99)
Potassium: 3.3 mmol/L — ABNORMAL LOW (ref 3.5–5.1)
Sodium: 140 mmol/L (ref 135–145)

## 2024-01-13 LAB — CBC
HCT: 39.5 % (ref 39.0–52.0)
HCT: 39.6 % (ref 39.0–52.0)
Hemoglobin: 13 g/dL (ref 13.0–17.0)
Hemoglobin: 13.3 g/dL (ref 13.0–17.0)
MCH: 28.6 pg (ref 26.0–34.0)
MCH: 28.4 pg (ref 26.0–34.0)
MCHC: 32.9 g/dL (ref 30.0–36.0)
MCHC: 33.6 g/dL (ref 30.0–36.0)
MCV: 86.8 fL (ref 80.0–100.0)
MCV: 84.4 fL (ref 80.0–100.0)
Platelets: 244 K/uL (ref 150–400)
Platelets: 255 K/uL (ref 150–400)
RBC: 4.55 MIL/uL (ref 4.22–5.81)
RBC: 4.69 MIL/uL (ref 4.22–5.81)
RDW: 15.3 % (ref 11.5–15.5)
RDW: 15.1 % (ref 11.5–15.5)
WBC: 7.5 K/uL (ref 4.0–10.5)
WBC: 8.9 K/uL (ref 4.0–10.5)
nRBC: 0 % (ref 0.0–0.2)
nRBC: 0 % (ref 0.0–0.2)

## 2024-01-13 LAB — MAGNESIUM: Magnesium: 1.6 mg/dL — ABNORMAL LOW (ref 1.7–2.4)

## 2024-01-13 LAB — HIV ANTIBODY (ROUTINE TESTING W REFLEX): HIV Screen 4th Generation wRfx: NONREACTIVE

## 2024-01-13 LAB — CREATININE, SERUM
Creatinine, Ser: 1.41 mg/dL — ABNORMAL HIGH (ref 0.61–1.24)
GFR, Estimated: 55 mL/min — ABNORMAL LOW (ref >60)

## 2024-01-13 LAB — TROPONIN I (HIGH SENSITIVITY)
Troponin I (High Sensitivity): 5 ng/L (ref <18)
Troponin I (High Sensitivity): 4 ng/L (ref <18)

## 2024-01-13 LAB — BRAIN NATRIURETIC PEPTIDE: B Natriuretic Peptide: 50.5 pg/mL (ref 0.0–100.0)

## 2024-01-13 SURGERY — LEFT HEART CATH AND CORONARY ANGIOGRAPHY
Anesthesia: LOCAL

## 2024-01-13 MED ORDER — ACETAMINOPHEN 650 MG RE SUPP: 650.0000 mg | Freq: Four times a day (QID) | RECTAL | Status: DC | PRN

## 2024-01-13 MED ORDER — ASPIRIN 81 MG PO CHEW
81.0000 mg | CHEWABLE_TABLET | ORAL | Status: AC
  Administered 2024-01-13: 81 mg via ORAL
  Filled 2024-01-13: qty 1

## 2024-01-13 MED ORDER — HEPARIN BOLUS VIA INFUSION
4000.0000 [IU] | Freq: Once | INTRAVENOUS | Status: AC
  Administered 2024-01-13: 4000 [IU] via INTRAVENOUS
  Filled 2024-01-13: qty 4000

## 2024-01-13 MED ORDER — MIDAZOLAM HCL 2 MG/2ML IJ SOLN
INTRAMUSCULAR | Status: AC
  Filled 2024-01-13: qty 2

## 2024-01-13 MED ORDER — PANTOPRAZOLE SODIUM 40 MG PO TBEC
40.0000 mg | DELAYED_RELEASE_TABLET | Freq: Every day | ORAL | Status: DC
  Administered 2024-01-14: 40 mg via ORAL
  Filled 2024-01-13: qty 1

## 2024-01-13 MED ORDER — VERAPAMIL HCL 2.5 MG/ML IV SOLN
INTRAVENOUS | Status: DC | PRN
  Administered 2024-01-13: 10 mL via INTRA_ARTERIAL

## 2024-01-13 MED ORDER — ENOXAPARIN SODIUM 40 MG/0.4ML IJ SOSY
40.0000 mg | PREFILLED_SYRINGE | INTRAMUSCULAR | Status: DC
  Administered 2024-01-14: 40 mg via SUBCUTANEOUS
  Filled 2024-01-13: qty 0.4

## 2024-01-13 MED ORDER — NITROGLYCERIN 0.4 MG SL SUBL
0.4000 mg | SUBLINGUAL_TABLET | SUBLINGUAL | Status: AC | PRN
  Administered 2024-01-13 (×3): 0.4 mg via SUBLINGUAL
  Filled 2024-01-13: qty 1

## 2024-01-13 MED ORDER — ROSUVASTATIN CALCIUM 20 MG PO TABS
40.0000 mg | ORAL_TABLET | Freq: Every day | ORAL | Status: DC
  Administered 2024-01-14: 40 mg via ORAL
  Filled 2024-01-13: qty 2

## 2024-01-13 MED ORDER — SODIUM CHLORIDE 0.9% FLUSH: 3.0000 mL | INTRAVENOUS | Status: DC | PRN

## 2024-01-13 MED ORDER — MIDAZOLAM HCL 2 MG/2ML IJ SOLN
INTRAMUSCULAR | Status: DC | PRN
  Administered 2024-01-13: 1 mg via INTRAVENOUS

## 2024-01-13 MED ORDER — LABETALOL HCL 5 MG/ML IV SOLN
INTRAVENOUS | Status: DC | PRN
  Administered 2024-01-13 (×2): 10 mg via INTRAVENOUS

## 2024-01-13 MED ORDER — IOHEXOL 350 MG/ML SOLN
INTRAVENOUS | Status: DC | PRN
  Administered 2024-01-13: 75 mL

## 2024-01-13 MED ORDER — HEPARIN SODIUM (PORCINE) 1000 UNIT/ML IJ SOLN
INTRAMUSCULAR | Status: DC | PRN
  Administered 2024-01-13: 6000 [IU] via INTRAVENOUS

## 2024-01-13 MED ORDER — ASPIRIN 81 MG PO TBEC
81.0000 mg | DELAYED_RELEASE_TABLET | Freq: Every day | ORAL | Status: DC
Start: 2024-01-14 — End: 2024-01-14
  Administered 2024-01-14: 81 mg via ORAL
  Filled 2024-01-13: qty 1

## 2024-01-13 MED ORDER — TAMSULOSIN HCL 0.4 MG PO CAPS
0.8000 mg | ORAL_CAPSULE | Freq: Every day | ORAL | Status: DC
  Administered 2024-01-14: 0.8 mg via ORAL
  Filled 2024-01-13: qty 2

## 2024-01-13 MED ORDER — LIDOCAINE HCL (PF) 1 % IJ SOLN
INTRAMUSCULAR | Status: DC | PRN
  Administered 2024-01-13: 5 mL

## 2024-01-13 MED ORDER — SODIUM CHLORIDE 0.9 % IV SOLN: 250.0000 mL | INTRAVENOUS | Status: DC | PRN

## 2024-01-13 MED ORDER — FLUTICASONE FUROATE-VILANTEROL 200-25 MCG/ACT IN AEPB
1.0000 | INHALATION_SPRAY | Freq: Every day | RESPIRATORY_TRACT | Status: DC
  Administered 2024-01-13: 1 via RESPIRATORY_TRACT
  Filled 2024-01-13: qty 28

## 2024-01-13 MED ORDER — BENZTROPINE MESYLATE 0.5 MG PO TABS: 0.5000 mg | ORAL_TABLET | Freq: Two times a day (BID) | ORAL | Status: DC | PRN

## 2024-01-13 MED ORDER — ACETAMINOPHEN 325 MG PO TABS
650.0000 mg | ORAL_TABLET | Freq: Four times a day (QID) | ORAL | Status: DC | PRN
  Administered 2024-01-13 – 2024-01-14 (×2): 650 mg via ORAL
  Filled 2024-01-13 (×2): qty 2

## 2024-01-13 MED ORDER — AMLODIPINE BESYLATE 10 MG PO TABS
10.0000 mg | ORAL_TABLET | Freq: Every day | ORAL | Status: DC
Start: 2024-01-13 — End: 2024-01-14
  Administered 2024-01-13 – 2024-01-14 (×2): 10 mg via ORAL
  Filled 2024-01-13: qty 2
  Filled 2024-01-13: qty 1

## 2024-01-13 MED ORDER — LIDOCAINE HCL (PF) 1 % IJ SOLN
INTRAMUSCULAR | Status: AC
  Filled 2024-01-13: qty 30

## 2024-01-13 MED ORDER — FENTANYL CITRATE (PF) 100 MCG/2ML IJ SOLN
INTRAMUSCULAR | Status: AC
  Filled 2024-01-13: qty 2

## 2024-01-13 MED ORDER — IPRATROPIUM-ALBUTEROL 0.5-2.5 (3) MG/3ML IN SOLN
3.0000 mL | Freq: Once | RESPIRATORY_TRACT | Status: AC
  Administered 2024-01-13: 3 mL via RESPIRATORY_TRACT
  Filled 2024-01-13: qty 3

## 2024-01-13 MED ORDER — NICOTINE 14 MG/24HR TD PT24: 14.0000 mg | MEDICATED_PATCH | Freq: Every day | TRANSDERMAL | Status: DC

## 2024-01-13 MED ORDER — HYDRALAZINE HCL 20 MG/ML IJ SOLN: 10.0000 mg | INTRAMUSCULAR | Status: AC | PRN

## 2024-01-13 MED ORDER — ASPIRIN 81 MG PO CHEW: 81.0000 mg | CHEWABLE_TABLET | ORAL | Status: DC

## 2024-01-13 MED ORDER — FUROSEMIDE 10 MG/ML IJ SOLN
40.0000 mg | Freq: Once | INTRAMUSCULAR | Status: AC
  Administered 2024-01-13: 40 mg via INTRAVENOUS
  Filled 2024-01-13: qty 4

## 2024-01-13 MED ORDER — FENTANYL CITRATE (PF) 100 MCG/2ML IJ SOLN
INTRAMUSCULAR | Status: DC | PRN
  Administered 2024-01-13 (×2): 25 ug via INTRAVENOUS

## 2024-01-13 MED ORDER — HEPARIN SODIUM (PORCINE) 1000 UNIT/ML IJ SOLN
INTRAMUSCULAR | Status: AC
  Filled 2024-01-13: qty 10

## 2024-01-13 MED ORDER — VERAPAMIL HCL 2.5 MG/ML IV SOLN
INTRAVENOUS | Status: AC
  Filled 2024-01-13: qty 2

## 2024-01-13 MED ORDER — SENNOSIDES-DOCUSATE SODIUM 8.6-50 MG PO TABS
1.0000 | ORAL_TABLET | Freq: Every evening | ORAL | Status: DC | PRN
  Administered 2024-01-14: 1 via ORAL
  Filled 2024-01-13: qty 1

## 2024-01-13 MED ORDER — POTASSIUM CHLORIDE CRYS ER 20 MEQ PO TBCR
40.0000 meq | EXTENDED_RELEASE_TABLET | Freq: Once | ORAL | Status: AC
  Administered 2024-01-13: 40 meq via ORAL
  Filled 2024-01-13: qty 2

## 2024-01-13 MED ORDER — FLUPHENAZINE HCL 5 MG PO TABS
10.0000 mg | ORAL_TABLET | Freq: Every day | ORAL | Status: DC
  Administered 2024-01-13 – 2024-01-14 (×2): 10 mg via ORAL
  Filled 2024-01-13 (×2): qty 2

## 2024-01-13 MED ORDER — LABETALOL HCL 5 MG/ML IV SOLN: 10.0000 mg | INTRAVENOUS | Status: AC | PRN

## 2024-01-13 MED ORDER — SODIUM CHLORIDE 0.9% FLUSH
3.0000 mL | Freq: Two times a day (BID) | INTRAVENOUS | Status: DC
  Administered 2024-01-13 – 2024-01-14 (×2): 3 mL via INTRAVENOUS

## 2024-01-13 MED ORDER — MAGNESIUM SULFATE 4 GM/100ML IV SOLN
4.0000 g | Freq: Once | INTRAVENOUS | Status: AC
  Administered 2024-01-13: 4 g via INTRAVENOUS
  Filled 2024-01-13: qty 100

## 2024-01-13 MED ORDER — HEPARIN (PORCINE) 25000 UT/250ML-% IV SOLN
1200.0000 [IU]/h | INTRAVENOUS | Status: DC
  Administered 2024-01-13: 1200 [IU]/h via INTRAVENOUS
  Filled 2024-01-13: qty 250

## 2024-01-13 MED ORDER — HEPARIN (PORCINE) IN NACL 2000-0.9 UNIT/L-% IV SOLN
INTRAVENOUS | Status: DC | PRN
  Administered 2024-01-13: 1000 mL

## 2024-01-13 MED ORDER — SODIUM CHLORIDE 0.9 % IV SOLN: INTRAVENOUS | Status: AC

## 2024-01-13 MED ORDER — AMLODIPINE BESYLATE 5 MG PO TABS: 10.0000 mg | ORAL_TABLET | Freq: Every day | ORAL | Status: DC

## 2024-01-13 MED ORDER — GABAPENTIN 300 MG PO CAPS
300.0000 mg | ORAL_CAPSULE | Freq: Two times a day (BID) | ORAL | Status: DC
  Administered 2024-01-13 – 2024-01-14 (×2): 300 mg via ORAL
  Filled 2024-01-13 (×2): qty 1

## 2024-01-13 MED ORDER — ONDANSETRON HCL 4 MG/2ML IJ SOLN: 4.0000 mg | Freq: Four times a day (QID) | INTRAMUSCULAR | Status: DC | PRN

## 2024-01-13 MED ORDER — LABETALOL HCL 5 MG/ML IV SOLN
INTRAVENOUS | Status: AC
  Filled 2024-01-13: qty 4

## 2024-01-13 MED ORDER — FREE WATER
250.0000 mL | Freq: Once | Status: AC
  Administered 2024-01-13: 250 mL via ORAL

## 2024-01-13 MED ORDER — FLUTICASONE PROPIONATE 50 MCG/ACT NA SUSP
2.0000 | Freq: Every day | NASAL | Status: DC
  Administered 2024-01-14: 2 via NASAL
  Filled 2024-01-13: qty 16

## 2024-01-13 MED ORDER — OXYCODONE-ACETAMINOPHEN 5-325 MG PO TABS
1.0000 | ORAL_TABLET | Freq: Once | ORAL | Status: AC
  Administered 2024-01-13: 1 via ORAL
  Filled 2024-01-13: qty 1

## 2024-01-13 MED ORDER — TRAZODONE HCL 100 MG PO TABS
100.0000 mg | ORAL_TABLET | Freq: Every day | ORAL | Status: DC
Start: 2024-01-13 — End: 2024-01-14
  Administered 2024-01-13: 100 mg via ORAL
  Filled 2024-01-13: qty 1

## 2024-01-13 SURGICAL SUPPLY — 9 items
CATH INFINITI 5 FR 3DRC (CATHETERS) IMPLANT
CATH INFINITI AMBI 5FR TG (CATHETERS) IMPLANT
CATH LAUNCHER 5F 3DRIGHT (CATHETERS) IMPLANT
DEVICE RAD COMP TR BAND LRG (VASCULAR PRODUCTS) IMPLANT
GLIDESHEATH SLEND SS 6F .021 (SHEATH) IMPLANT
GUIDEWIRE INQWIRE 1.5J.035X260 (WIRE) IMPLANT
PACK CARDIAC CATHETERIZATION (CUSTOM PROCEDURE TRAY) ×1 IMPLANT
SET ATX-X65L (MISCELLANEOUS) IMPLANT
SHEATH PROBE COVER 6X72 (BAG) IMPLANT

## 2024-01-13 NOTE — H&P (Addendum)
 History and Physical    [name] FMW:[id number] DOB: [birth year] DOA: 01/13/2024  DOS: the patient was seen and examined on 01/13/2024  PCP: Elnor Lauraine BRAVO, NP   Patient coming from: Clinic  I have personally briefly reviewed patient's old medical records in [facility] and CareEverywhere  HPI:  [name] is a 68 y.o. year old male with past medical history of HTN, HLD c/b CAD, prostate cancer, Schizophrenia, DDD, tobacco use disorder presenting to the ED from cardiology clinic this morning.  States arm has been hurting for two days and chest pain started this morning upon waking up. Per patient the pain didn't wake him up from sleep but he noticed it after. Feels like something stabbing him. States intensity is about 8/10.  States it has been present since onset.  Patient's PCP is Lauraine Elnor, NP.  Last outpatient visit was 2 weeks ago.  Patient also sees Cardiology. Psychiatry for schizophrenia. NSG for DDD.    ED Course: On arrival to the ED, patient was hemodynamically stable.  Initial lab work showed normal troponin, EKG without any ST changes. Chest x-ray was negative for any acute cardiopulmonary process.  Cardiology was consulted per the recommendations of the clinic cardiologist as patient needed left heart cath for unstable angina.  Patient given oxycodone , DuoNebs and started on heparin  drip in the ED.  TRH was consulted for admission.  Review of Systems: As mentioned in the history of present illness. All other systems reviewed and are negative.   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 Lily   COLONOSCOPY  04/23/2010   normal   COLONOSCOPY WITH PROPOFOL   01/22/2021   poor prep[   ESOPHAGOGASTRODUODENOSCOPY N/A 12/31/2013   Procedure: ESOPHAGOGASTRODUODENOSCOPY (EGD);  Surgeon: Lupita BRAVO Commander, MD;  Location: [facility];  Service: Endoscopy;  Laterality: N/A;   Excision of scalp lesion  07/18/2009   Excision of thyroid  mass     GANGLION CYST EXCISION     right foot x 2   HERNIA REPAIR     ventral   INCISIONAL HERNIA REPAIR  12/01/2011   Procedure: LAPAROSCOPIC INCISIONAL HERNIA;  Surgeon: Vicenta DELENA Poli, MD;  Location: [facility];  Service: General;  Laterality: N/A;   SHOULDER SURGERY     right   UPPER GASTROINTESTINAL ENDOSCOPY       reports that he has been smoking cigarettes. He started smoking about 54 years ago. He has  a 82.1 pack-year smoking history. He has never been exposed to tobacco smoke. He has never used smokeless tobacco. He reports current drug use. Frequency: 1.00 time per week. Drug: Marijuana. He reports that he does not drink alcohol.  Allergies  Allergen Reactions   Iohexol  Other (See Comments)    Affects nerves: triggers schizophrenia   Ivp Dye [Iodinated Contrast Media]      triggers schizophrenia   Metoclopramide Hcl Other (See Comments)    Affects nerves: triggers schizophrenia   Ritalin [Methylphenidate]       triggers schizophrenia   Wellbutrin [Bupropion] Other (See Comments)    Makes pt smoke heavily    Family History  Problem Relation Age of Onset   Hypertension Mother    Bone cancer Mother    Hypertension Father    Kidney disease Father    Prostate cancer Brother    Bone cancer Brother    Kidney disease Brother    Mental illness Maternal Grandmother    Mental illness Son    Prostate cancer Other    Colon cancer Neg Hx    Colon polyps Neg Hx    Esophageal cancer Neg Hx    Rectal cancer Neg Hx    Stomach cancer Neg Hx     Prior to Admission medications   Medication Sig Start Date End Date Taking? Authorizing Provider  acetaminophen  (TYLENOL ) 500 MG tablet Take 1,000 mg by mouth every 6 (six) hours as needed for moderate pain.     [provider]  albuterol  (PROVENTIL ) (2.5 MG/3ML) 0.083% nebulizer solution INHALE 3 ML BY NEBULIZATION EVERY 6 HOURS AS NEEDED FOR WHEEZING OR SHORTNESS OF BREATH 07/27/22   Jason Leita Repine, FNP  albuterol  (VENTOLIN  HFA) 108 (90 Base) MCG/ACT inhaler TAKE 2 PUFFS BY MOUTH EVERY 6 HOURS AS NEEDED FOR WHEEZE OR SHORTNESS OF BREATH 02/05/23   Kassie Acquanetta Bradley, MD  benztropine  (COGENTIN ) 0.5 MG tablet Take 1 tablet (0.5 mg total) by mouth 2 (two) times daily. As needed for side effects of Prolixin  06/16/23   Eappen, Saramma, MD  celecoxib  (CELEBREX ) 50 MG capsule TAKE 1 CAPSULE (50 MG TOTAL) BY MOUTH 2 (TWO) TIMES DAILY AS NEEDED FOR PAIN. 11/25/22   Onita Duos, MD  cetirizine  (ZYRTEC ) 10 MG tablet Take 1 tablet (10 mg total) by mouth daily. 06/27/23   Soldatova, Liuba, MD  Cyanocobalamin  (VITAMIN B-12 PO) Take 1 tablet by mouth daily.    [provider]  cyclobenzaprine  (FLEXERIL ) 10 MG tablet TAKE 1 TABLET BY MOUTH EVERYDAY AT BEDTIME 12/12/23   Jason Leita Repine, FNP  diclofenac  Sodium (VOLTAREN ) 1 % GEL Apply 2 g topically 4 (four) times daily as needed (pain). 02/08/23   Jason Leita Repine, FNP  EPINEPHrine  0.3 mg/0.3 mL IJ  SOAJ injection Inject 0.3 mg into the muscle as needed for anaphylaxis. 05/28/23   Theadore Ozell HERO, MD  esomeprazole  (NEXIUM ) 40 MG capsule TAKE 1 CAPSULE BY MOUTH EVERY DAY 11/11/23   Jason Leita Repine, FNP  fluPHENAZine  (PROLIXIN ) 10 MG tablet TAKE 1 TABLET BY MOUTH EVERY DAY 11/11/23   Eappen, Saramma, MD  fluticasone  (FLONASE ) 50 MCG/ACT nasal spray Place 2 sprays into both nostrils daily. 06/27/23   Soldatova, Liuba, MD  furosemide  (LASIX ) 20 MG tablet Take 1 tablet daily as directed; may take 2nd pill in as needed for edema 12/30/23   Jason Leita Repine, FNP  gabapentin  (NEURONTIN ) 300 MG capsule TAKE 1 CAPSULE BY MOUTH TWICE A DAY 01/04/24   Jason Leita Repine,  FNP  hydrOXYzine  (VISTARIL ) 25 MG capsule Take 1 capsule (25 mg total) by mouth every 8 (eight) hours as needed for anxiety. 01/09/24   Eappen, Saramma, MD  potassium chloride  SA (KLOR-CON  M) 20 MEQ tablet TAKE 1 TABLET BY MOUTH EVERY DAY 09/05/23   Jason Leita Repine, FNP  predniSONE  (STERAPRED UNI-PAK 21 TAB) 5 MG (21) TBPK tablet Take 5 mg by mouth See admin instructions. Per dose pack 01/03/24   [provider]  rosuvastatin  (CRESTOR ) 10 MG tablet TAKE 1 TABLET BY MOUTH EVERY DAY 09/05/23   Jason Leita Repine, FNP  sildenafil  (VIAGRA ) 100 MG tablet Take 100 mg by mouth daily as needed for erectile dysfunction. 10/28/20   [provider]  SYMBICORT  160-4.5 MCG/ACT inhaler INHALE 2 PUFFS INTO THE LUNGS IN THE MORNING AND 2 PUFFS AT BEDTIME 12/15/22   Perri Ronal PARAS, MD  tamsulosin  (FLOMAX ) 0.4 MG CAPS capsule Take 0.8 mg by mouth daily. 06/09/23   [provider]  traMADol  (ULTRAM ) 50 MG tablet Take 1 tablet (50 mg total) by mouth every 6 (six) hours as needed. 12/31/22   Steinl, Kevin, MD  traZODone  (DESYREL ) 100 MG tablet TAKE 1/2 TO 1 TABLET (50-100 MG TOTAL) BY MOUTH AT BEDTIME AS NEEDED FOR SLEEP 12/22/23   Eappen, Saramma, MD  valsartan  (DIOVAN ) 320 MG tablet Take 1 tablet (320 mg total) by mouth  daily. 11/18/23   Jason Leita Repine, FNP  VITAMIN D  PO Take 1 tablet by mouth daily.    [provider]    Physical Exam: Vitals:   01/13/24 9060 01/13/24 0940 01/13/24 0952  BP:   (!) 152/93  Pulse:   81  Resp:   16  Temp:   98 F (36.7 C)  TempSrc:   Oral  SpO2: 95%  99%  Weight:  117 kg   Height:  5' 8 (1.727 m)     Physical Exam: Gen: NAD HENT: MMM CV: normal heart sounds, good extremity pulses Resp:clear lung sounds Abd: hernia present, bowel sounds present, no ttp MSK: no asymmetry, good bulk and tone Skin: no rash on skin Neuro: alert and oriented x4 Psych: normal mood   Labs on Admission: I have personally reviewed following labs and imaging studies  CBC: Recent Labs  Lab 01/13/24 0940  WBC 7.5  HGB 13.0  HCT 39.5  MCV 86.8  PLT 244   Basic Metabolic Panel: Recent Labs  Lab 01/13/24 0940  NA 140  K 3.3*  CL 102  CO2 30  GLUCOSE 96  BUN 5*  CREATININE 1.44*  CALCIUM  8.8*   GFR: Estimated Creatinine Clearance: 62.7 mL/min (A) (by C-G formula based on SCr of 1.44 mg/dL (H)). Liver Function Tests: No results for input(s): AST, ALT, ALKPHOS, BILITOT, PROT, ALBUMIN in the last 168 hours. No results for input(s): LIPASE, AMYLASE in the last 168 hours. No results for input(s): AMMONIA in the last 168 hours. Coagulation Profile: No results for input(s): INR, PROTIME in the last 168 hours. Cardiac Enzymes: Recent Labs  Lab 01/13/24 0940 01/13/24 1239  TROPONINIHS 5 4   BNP (last 3 results) Recent Labs    01/13/24 0940  BNP 50.5   HbA1C: No results for input(s): HGBA1C in the last 72 hours. CBG: No results for input(s): GLUCAP in the last 168 hours. Lipid Profile: No results for input(s): CHOL, HDL, LDLCALC, TRIG, CHOLHDL, LDLDIRECT in the last 72 hours. Thyroid  Function Tests: No results for input(s): TSH, T4TOTAL, FREET4, T3FREE, THYROIDAB in the last 72  hours. Anemia  Panel: No results for input(s): VITAMINB12, FOLATE, FERRITIN, TIBC, IRON, RETICCTPCT in the last 72 hours. Urine analysis:    Component Value Date/Time   COLORURINE YELLOW 08/08/2022 2204   APPEARANCEUR CLEAR 08/08/2022 2204   LABSPEC 1.015 08/08/2022 2204   PHURINE 7.0 08/08/2022 2204   GLUCOSEU NEGATIVE 08/08/2022 2204   GLUCOSEU NEGATIVE 03/05/2014 0952   HGBUR NEGATIVE 08/08/2022 2204   BILIRUBINUR NEGATIVE 08/08/2022 2204   BILIRUBINUR negative 11/16/2020 1148   BILIRUBINUR small 07/18/2013 1357   KETONESUR NEGATIVE 08/08/2022 2204   PROTEINUR NEGATIVE 08/08/2022 2204   UROBILINOGEN 0.2 11/16/2020 1148   UROBILINOGEN 0.2 03/05/2014 0952   NITRITE NEGATIVE 08/08/2022 2204   LEUKOCYTESUR NEGATIVE 08/08/2022 2204    Radiological Exams on Admission: I have personally reviewed images DG Chest Port 1 View Result Date: 01/13/2024 EXAM: 1 VIEW(S) XRAY OF THE CHEST 01/13/2024 09:51:10 AM COMPARISON: 11/21/2023 CLINICAL HISTORY: CP FINDINGS: LUNGS AND PLEURA: No focal pulmonary opacity. No pulmonary edema. No pleural effusion. No pneumothorax. HEART AND MEDIASTINUM: Aortic arch atherosclerosis. No acute abnormality of the cardiac and mediastinal silhouettes. BONES AND SOFT TISSUES: Surgical clips at thoracic inlet. No acute osseous abnormality. IMPRESSION: 1. No acute cardiopulmonary process. Electronically signed by: Waddell Calk MD 01/13/2024 10:11 AM EDT RP Workstation: [id number]    EKG: My personal interpretation of EKG shows: EKG shows sinus rhythm without any ST changes.  It does show abnormal R wave progression not previously seen.    Assessment/Plan Principal Problem:   Unstable angina (HCC) Active Problems:   Hyperlipidemia   Schizophrenia (HCC)   LOW BACK PAIN, CHRONIC   Prediabetes Pt presented to the ED with chest pain. Concern for ACS present so ED requested admission. Pt has no ST elevation and troponin is normal. Repeat pending. Cardiology consulted,  appreciate their recommendations. Pt loaded with aspirin  and will restart high intensity statin. Pt is currently HDS and satting well on RA. Plan to cath patient today.  Echo ordered, will follow-up results.  Will monitor hemodynamic status, and cardiology recommendations.  Nitroglycerin  ordered for chest pain.  Will check magnesium  and replete magnesium  potassium as needed.   Chronic conditions: Hypertension: Restart home medications.  Hold ARB given elevated creatinine and patient undergoing cath today. Schizophrenia: Restart home medication. Prediabetes: Monitor daily CBG.  Last A1c was 6.2 in 1 month ago.  No need to repeat A1c currently. Hyperlipidemia: Last LDL was 74 around 1 month ago.  Patient is on moderate intensity statin.  Goal LDL is less than 70. Will increase to crestor  40 mg every day.  Prostate cancer: Patient reports he follows with [facility] and they are actively monitoring his prostate cancer.  Continue outpatient workup for this. Tobacco use disorder: Patient with 1.5 pack/day for 30 years.  Will start him on obtaining placement and have counseled him on tobacco cessation given active cancer and pulmonary nodules and his breathing complaints that are chronic.  Patient had spirometry in 2022 that showed mild obstructive disease that did not respond to bronchodilator.  Continue to monitor this outpatient. DDD: Follow NSG outpatient as previously. Lower extremity swelling: minimal swelling noted on exam which may be due to amlodipine . Holding home lasix  currently.  VTE prophylaxis:  IV heparin  gtts  Diet: NPO Access: PIV Code Status:  Full Code HCDM: Telemetry:  Admission status: Inpatient, Telemetry bed Patient is from: Home  Anticipated d/c is to: Home  Anticipated d/c date is: 2 days    Family Communication: Pt to communicate with family  Consults called: Cardiology    Severity of Illness: The appropriate patient status for this patient is INPATIENT.  Inpatient status is judged to be reasonable and necessary in order to provide the required intensity of service to ensure the patient's safety. The patient's presenting symptoms, physical exam findings, and initial radiographic and laboratory data in the context of their chronic comorbidities is felt to place them at high risk for further clinical deterioration. Furthermore, it is not anticipated that the patient will be medically stable for discharge from the hospital within 2 midnights of admission.   * I certify that at the point of admission it is my clinical judgment that the patient will require inpatient hospital care spanning beyond 2 midnights from the point of admission due to high intensity of service, high risk for further deterioration and high frequency of surveillance required.DEWAINE Morene Bathe, MD [facility]

## 2024-01-13 NOTE — Progress Notes (Signed)
 PHARMACY - ANTICOAGULATION CONSULT NOTE  Pharmacy Consult for heparin  Indication: chest pain/ACS  Allergies  Allergen Reactions   Iohexol  Other (See Comments)    Affects nerves: triggers schizophrenia   Ivp Dye [Iodinated Contrast Media]      triggers schizophrenia   Metoclopramide Hcl Other (See Comments)    Affects nerves: triggers schizophrenia   Ritalin [Methylphenidate]      triggers schizophrenia   Wellbutrin [Bupropion] Other (See Comments)    Makes pt smoke heavily    Patient Measurements: Height: 5' 8 (172.7 cm) Weight: 117 kg (258 lb) IBW/kg (Calculated) : 68.4 HEPARIN  DW (KG): 95  Vital Signs: Temp: 98 F (36.7 C) (09/26 0952) Temp Source: Oral (09/26 0952) BP: 152/93 (09/26 0952) Pulse Rate: 81 (09/26 0952)  Labs: Recent Labs    01/13/24 0940  HGB 13.0  HCT 39.5  PLT 244  CREATININE 1.44*  TROPONINIHS 5    Estimated Creatinine Clearance: 62.7 mL/min (A) (by C-G formula based on SCr of 1.44 mg/dL (H)).   Medical History: 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 To start IV heparin . Baseline CBC is WNL and he is not on anticoagulation PTA.   Goal of Therapy:  Heparin  level 0.3-0.7 units/ml Monitor platelets by anticoagulation protocol: Yes   Plan:  Heparin  bolus 4000 units IV x 1 Heparin  gtt 1200 units/hr Check a 6 hr heparin  level Daily heparin  level and CBC  [name], Vernell Helling 01/13/2024,12:47 PM

## 2024-01-13 NOTE — Patient Instructions (Signed)
 Sending to emergency room   Follow-Up: At [facility], you and your health needs are our priority.  As part of our continuing mission to provide you with exceptional heart care, our providers are all part of one team.  This team includes your primary Cardiologist (physician) and Advanced Practice Providers or APPs (Physician Assistants and Nurse Practitioners) who all work together to provide you with the care you need, when you need it.  Your next appointment:   To be determined   Provider:   Georganna Archer, MD

## 2024-01-13 NOTE — Interval H&P Note (Signed)
 History and Physical Interval Note:  01/13/2024 4:45 PM  [name]  has presented today for surgery, with the diagnosis of unstable angina.  The various methods of treatment have been discussed with the patient and family. After consideration of risks, benefits and other options for treatment, the patient has consented to  Procedure(s): LEFT HEART CATH AND CORONARY ANGIOGRAPHY (N/A)  PERCUTANEOUS CORONARY INTERVENTION  as a surgical intervention.  The patient's history has been reviewed, patient examined, no change in status, stable for surgery.  I have reviewed the patient's chart and labs.  Questions were answered to the patient's satisfaction.     Alm Clay

## 2024-01-13 NOTE — Consult Note (Signed)
 Cardiology Consultation   Patient ID: Allante Whitmire MRN: [id number]; DOB: January 07, 1956  Admit date: 01/13/2024 Date of Consult: 01/13/2024  PCP:  Elnor Lauraine BRAVO, NP   [facility] Providers Cardiologist:  Georganna Archer, MD      Patient Profile: [name] is a 68 y.o. male with a hx of HTN, HLD, CKD stage 3, tobacco use disorder, COPD, schizophrenia who is being seen 01/13/2024 for the evaluation of chest pain at the request of Dr. TEE.  History of Present Illness: Mr. Varady presented to the ER 8/25 with chest pain and troponin was negative x 2. EKG was negative for ischemia. CXR WNL. He was discharged with cardiology referral. Chest CT showed severe 3V CAC and aortic calcifications.   The patient saw Dr. Archer 9/26 reporting active chest pain radiation down his arm. He rated it a 7/10 and he was sent to the ER.   In the ER initial EKG showed NSR with PVCs. 324mg  ASA given by EMS. HS trop 5. CXR non-acute. In the ER he was given oxycodone  and duonebs. He still reports 8/10 chest pain. Discussed with MD, plan for admission for cardiac cath.   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 Lily   COLONOSCOPY  04/23/2010   normal   COLONOSCOPY WITH PROPOFOL   01/22/2021   poor prep[   ESOPHAGOGASTRODUODENOSCOPY N/A 12/31/2013   Procedure: ESOPHAGOGASTRODUODENOSCOPY (EGD);  Surgeon: Lupita BRAVO Commander, MD;  Location: [facility];  Service: Endoscopy;  Laterality: N/A;   Excision of scalp lesion  07/18/2009   Excision of thyroid  mass     GANGLION CYST EXCISION     right foot x 2   HERNIA REPAIR     ventral   INCISIONAL HERNIA REPAIR  12/01/2011   Procedure: LAPAROSCOPIC INCISIONAL HERNIA;  Surgeon: Vicenta DELENA Poli, MD;  Location: [facility];  Service: General;  Laterality: N/A;   SHOULDER SURGERY     right   UPPER GASTROINTESTINAL ENDOSCOPY       Home Medications:  Prior to Admission medications   Medication Sig Start Date End Date Taking? Authorizing Provider  acetaminophen  (TYLENOL ) 500 MG tablet Take 1,000 mg by mouth every 6 (six) hours as needed for moderate pain.     [provider]  albuterol  (PROVENTIL ) (2.5 MG/3ML) 0.083% nebulizer solution INHALE 3 ML BY NEBULIZATION EVERY 6 HOURS AS NEEDED FOR WHEEZING OR SHORTNESS OF BREATH 07/27/22   Jason Leita Repine,  FNP  albuterol  (VENTOLIN  HFA) 108 (90 Base) MCG/ACT inhaler TAKE 2 PUFFS BY MOUTH EVERY 6 HOURS AS NEEDED FOR WHEEZE OR SHORTNESS OF BREATH 02/05/23   Kassie Acquanetta Bradley, MD  benztropine  (COGENTIN ) 0.5 MG tablet Take 1 tablet (0.5 mg total) by mouth 2 (two) times daily. As needed for side effects of Prolixin  06/16/23   Eappen, Saramma, MD  celecoxib  (CELEBREX ) 50 MG capsule TAKE 1 CAPSULE (50 MG TOTAL) BY MOUTH 2 (TWO) TIMES DAILY AS NEEDED FOR PAIN. 11/25/22   Onita Duos, MD  cetirizine  (ZYRTEC ) 10 MG tablet Take 1 tablet (10 mg total) by mouth daily. 06/27/23   Soldatova, Liuba, MD  Cyanocobalamin   (VITAMIN B-12 PO) Take 1 tablet by mouth daily.    [provider]  cyclobenzaprine  (FLEXERIL ) 10 MG tablet TAKE 1 TABLET BY MOUTH EVERYDAY AT BEDTIME 12/12/23   Jason Leita Repine, FNP  diclofenac  Sodium (VOLTAREN ) 1 % GEL Apply 2 g topically 4 (four) times daily as needed (pain). 02/08/23   Jason Leita Repine, FNP  EPINEPHrine  0.3 mg/0.3 mL IJ SOAJ injection Inject 0.3 mg into the muscle as needed for anaphylaxis. 05/28/23   Theadore Ozell HERO, MD  esomeprazole  (NEXIUM ) 40 MG capsule TAKE 1 CAPSULE BY MOUTH EVERY DAY 11/11/23   Jason Leita Repine, FNP  fluPHENAZine  (PROLIXIN ) 10 MG tablet TAKE 1 TABLET BY MOUTH EVERY DAY 11/11/23   Eappen, Saramma, MD  fluticasone  (FLONASE ) 50 MCG/ACT nasal spray Place 2 sprays into both nostrils daily. 06/27/23   Soldatova, Liuba, MD  furosemide  (LASIX ) 20 MG tablet Take 1 tablet daily as directed; may take 2nd pill in as needed for edema 12/30/23   Jason Leita Repine, FNP  gabapentin  (NEURONTIN ) 300 MG capsule TAKE 1 CAPSULE BY MOUTH TWICE A DAY 01/04/24   Jason Leita Repine, FNP  hydrOXYzine  (VISTARIL ) 25 MG capsule Take 1 capsule (25 mg total) by mouth every 8 (eight) hours as needed for anxiety. 01/09/24   Eappen, Saramma, MD  potassium chloride  SA (KLOR-CON  M) 20 MEQ tablet TAKE 1 TABLET BY MOUTH EVERY DAY 09/05/23   Jason Leita Repine, FNP  predniSONE  (STERAPRED UNI-PAK 21 TAB) 5 MG (21) TBPK tablet Take 5 mg by mouth See admin instructions. Per dose pack 01/03/24   [provider]  rosuvastatin  (CRESTOR ) 10 MG tablet TAKE 1 TABLET BY MOUTH EVERY DAY 09/05/23   Jason Leita Repine, FNP  sildenafil  (VIAGRA ) 100 MG tablet Take 100 mg by mouth daily as needed for erectile dysfunction. 10/28/20   [provider]  SYMBICORT  160-4.5 MCG/ACT inhaler INHALE 2 PUFFS INTO THE LUNGS IN THE MORNING AND 2 PUFFS AT BEDTIME 12/15/22   Perri Ronal PARAS, MD  tamsulosin  (FLOMAX ) 0.4 MG CAPS capsule Take 0.8 mg by mouth daily. 06/09/23    [provider]  traMADol  (ULTRAM ) 50 MG tablet Take 1 tablet (50 mg total) by mouth every 6 (six) hours as needed. 12/31/22   Steinl, Kevin, MD  traZODone  (DESYREL ) 100 MG tablet TAKE 1/2 TO 1 TABLET (50-100 MG TOTAL) BY MOUTH AT BEDTIME AS NEEDED FOR SLEEP 12/22/23   Eappen, Saramma, MD  valsartan  (DIOVAN ) 320 MG tablet Take 1 tablet (320 mg total) by mouth daily. 11/18/23   Jason Leita Repine, FNP  VITAMIN D  PO Take 1 tablet by mouth daily.    [provider]    Scheduled Meds:  free water   250 mL Oral Once   heparin   4,000 Units Intravenous Once   Continuous Infusions:  heparin   PRN Meds:   Allergies:    Allergies  Allergen Reactions   Iohexol  Other (See Comments)    Affects nerves: triggers schizophrenia   Ivp Dye [Iodinated Contrast Media]      triggers schizophrenia   Metoclopramide Hcl Other (See Comments)    Affects nerves: triggers schizophrenia   Ritalin [Methylphenidate]      triggers schizophrenia   Wellbutrin [Bupropion] Other (See Comments)    Makes pt smoke heavily    Social History:   Social History   Socioeconomic History   Marital status: Married    Spouse name: Doyal   Number of children: 2   Years of education: Not on file   Highest education level: 12th grade  Occupational History   Occupation: Disabled    Employer: DISABLED  Tobacco Use   Smoking status: Every Day    Current packs/day: 1.50    Average packs/day: 1.5 packs/day for 54.7 years (82.1 ttl pk-yrs)    Types: Cigarettes    Start date: 1971    Passive exposure: Never   Smokeless tobacco: Never   Tobacco comments:    Started smoking at age 41,still smoking pack - 1-1/2 packs  per day 01/08/2021  Vaping Use   Vaping status: Never Used  Substance and Sexual Activity   Alcohol use: No   Drug use: Yes    Frequency: 1.0 times per week    Types: Marijuana   Sexual activity: Yes    Partners: Female  Other Topics Concern   Not on file  Social History Narrative    HSG disabled due to schizophrenia - 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 stable long-term marriage with children. cared for his father-in-law- passed away in January 23, 2024   Social Drivers of Health   Financial Resource Strain: Low Risk  (12/29/2023)   Overall Financial Resource Strain (CARDIA)    Difficulty of Paying Living Expenses: Not hard at all  Food Insecurity: No Food Insecurity (12/29/2023)   Hunger Vital Sign    Worried About Running Out of Food in the Last Year: Never true    Ran Out of Food in the Last Year: Never true  Transportation Needs: No Transportation Needs (12/29/2023)   PRAPARE - Administrator, Civil Service (Medical): No    Lack of Transportation (Non-Medical): No  Physical Activity: Insufficiently Active (12/29/2023)   Exercise Vital Sign    Days of Exercise per Week: 2 days    Minutes of Exercise per Session: 10 min  Stress: No Stress Concern Present (12/29/2023)   [employer] of Occupational Health - Occupational Stress Questionnaire    Feeling of Stress: Not at all  Social Connections: Moderately Integrated (12/29/2023)   Social Connection and Isolation Panel    Frequency of Communication with Friends and Family: More than three times a week    Frequency of Social Gatherings with Friends and Family: Once a week    Attends Religious Services: 1 to 4 times per year    Active Member of [employer] or Organizations: No    Attends Engineer, structural: Not on file    Marital Status: Married  Recent Concern: Social Connections - Moderately Isolated (12/05/2023)   Social Connection and Isolation Panel    Frequency of Communication with Friends and Family: More than three times a week    Frequency of Social Gatherings with Friends and Family: Twice a week    Attends Religious Services: Never    Database administrator or Organizations: No    Attends Club or  Organization Meetings: Never    Marital Status: Married  Catering manager Violence: Not At Risk (12/05/2023)   Humiliation,  Afraid, Rape, and Kick questionnaire    Fear of Current or Ex-Partner: No    Emotionally Abused: No    Physically Abused: No    Sexually Abused: No    Family History:    Family History  Problem Relation Age of Onset   Hypertension Mother    Bone cancer Mother    Hypertension Father    Kidney disease Father    Prostate cancer Brother    Bone cancer Brother    Kidney disease Brother    Mental illness Maternal Grandmother    Mental illness Son    Prostate cancer Other    Colon cancer Neg Hx    Colon polyps Neg Hx    Esophageal cancer Neg Hx    Rectal cancer Neg Hx    Stomach cancer Neg Hx      ROS:  Please see the history of present illness.   All other ROS reviewed and negative.     Physical Exam/Data: Vitals:   01/13/24 0939 01/13/24 0940 01/13/24 0952  BP:   (!) 152/93  Pulse:   81  Resp:   16  Temp:   98 F (36.7 C)  TempSrc:   Oral  SpO2: 95%  99%  Weight:  117 kg   Height:  5' 8 (1.727 m)    No intake or output data in the 24 hours ending 01/13/24 1325    01/13/2024    9:40 AM 01/13/2024    8:28 AM 12/30/2023   11:11 AM  Last 3 Weights  Weight (lbs) 258 lb 259 lb 9.6 oz 264 lb 6.4 oz  Weight (kg) 117.028 kg 117.754 kg 119.931 kg     Body mass index is 39.23 kg/m.  General:  Well nourished, well developed, in no acute distress HEENT: normal Neck: no JVD Vascular: No carotid bruits; Distal pulses 2+ bilaterally Cardiac:  normal S1, S2; RRR; no murmur  Lungs:  clear to auscultation bilaterally, no wheezing, rhonchi or rales  Abd: soft, nontender, no hepatomegaly  Ext: 1+ lower leg edema Musculoskeletal:  No deformities, BUE and BLE strength normal and equal Skin: warm and dry  Neuro:  CNs 2-12 intact, no focal abnormalities noted Psych:  Normal affect    Telemetry:  Telemetry was personally reviewed and demonstrates:  NSR HR 70s  Relevant CV Studies:  MPI 10/2020 The left ventricular ejection fraction is mildly decreased (45-54%). Nuclear  stress EF: 54%. There was no ST segment deviation noted during stress. There is a small defect of mild severity present in the apex location. The defect is non-reversible. In the setting of normal LVF this likely represents attenuation artifact. This is a low risk study.  Laboratory Data: High Sensitivity Troponin:   Recent Labs  Lab 01/13/24 0940 01/13/24 1239  TROPONINIHS 5 4     Chemistry Recent Labs  Lab 01/13/24 0940  NA 140  K 3.3*  CL 102  CO2 30  GLUCOSE 96  BUN 5*  CREATININE 1.44*  CALCIUM  8.8*  GFRNONAA 54*  ANIONGAP 8    No results for input(s): PROT, ALBUMIN, AST, ALT, ALKPHOS, BILITOT in the last 168 hours. Lipids No results for input(s): CHOL, TRIG, HDL, LABVLDL, LDLCALC, CHOLHDL in the last 168 hours.  Hematology Recent Labs  Lab 01/13/24 0940  WBC 7.5  RBC 4.55  HGB 13.0  HCT 39.5  MCV 86.8  MCH 28.6  MCHC 32.9  RDW 15.3  PLT 244   Thyroid  No results for input(s): TSH, FREET4 in the last 168 hours.  BNPNo results for input(s): BNP, PROBNP in the last 168 hours.  DDimer No results for input(s): DDIMER in the last 168 hours.  Radiology/Studies:  DG Chest Port 1 View Result Date: 01/13/2024 EXAM: 1 VIEW(S) XRAY OF THE CHEST 01/13/2024 09:51:10 AM COMPARISON: 11/21/2023 CLINICAL HISTORY: CP FINDINGS: LUNGS AND PLEURA: No focal pulmonary opacity. No pulmonary edema. No pleural effusion. No pneumothorax. HEART AND MEDIASTINUM: Aortic arch atherosclerosis. No acute abnormality of the cardiac and mediastinal silhouettes. BONES AND SOFT TISSUES: Surgical clips at thoracic inlet. No acute osseous abnormality. IMPRESSION: 1. No acute cardiopulmonary process. Electronically signed by: Waddell Calk MD 01/13/2024 10:11 AM EDT RP Workstation: [id number]     Assessment and Plan:  Unstable Angina - patient presented from the cardiology office with chest pain radiating down his arm with concern for unstable angina - prior  Chest CT for cancer screening showed severe 3 vessel CAC - in the ER HS trop 5 and EKG with PVCs, otherwise no significant ischemic changes. He was given ASA, oxy, and duonebs with no improvement. Could not give SL NTG since he took ED medications last night - reports persistent 8/10 chest pain - continue to trend troponin - IV heparin  - does appear mildly volume up on exam - check echo - PTA not on ASA, start ASA 81mg  daily - continue Crestor  10mg  daily>recommend increase to 40mg  daily - recommend low dose BB - plan for LHC  Risks and benefits of cardiac catheterization have been discussed with the patient.  These include bleeding, infection, kidney damage, stroke, heart attack, death.  The patient understands these risks and is willing to proceed.   Lower leg edema - reported this was intermittent over the last few weeks - 1+ edema on exam - check echo - BNP pending - PTA lasix  320mg  daily - will give IV lasix  40mg  daily with potassium - daily weights and strict I/Os  CKD stage 3 - Scr 1.22, bUN 5 - prior Scr 1.29 - daily BMET  HTN - BP is mildly elevated - continue PTA valsartan  320mg  daily - consider BB tomorrow  HLD - LDL 74 - continue Crestor >recommend increase to 40mg  daily  COPD Tobacco use - he smokes 1 ppd - s/p duonebs -p per IM  Schizophrenia - per IM   For questions or updates, please contact [facility] Please consult [email or web address] for contact info under      Signed, Beulah Capobianco VEAR Fishman, PA-C  01/13/2024 1:25 PM

## 2024-01-13 NOTE — ED Notes (Signed)
 [facility]leb was unable to get labs

## 2024-01-13 NOTE — ED Provider Notes (Signed)
 [place] EMERGENCY DEPARTMENT AT [facility] Provider Note   CSN: [id number] Arrival date & time: 01/13/24  9064     Patient presents with: Chest Pain   [name] is a 68 y.o. male.   68 year old male with past medical history of hypertension, hyperlipidemia, and tobacco abuse as well as COPD presenting to the emergency department today with chest pain/pressure.  The patient states this began this morning.  States that the pain and pressure is in the center of his chest.  He denies any associated nausea or diaphoresis.  Pain is not exertional.  He was actually going for routine follow-up with his cardiologist when this started.  He denies any recent fevers or cough.  He reports bilateral lower extremity swelling this been going on which is what he was seeing his cardiologist for primarily.  He came to the ER today for further evaluation regarding this.   Chest Pain      Prior to Admission medications   Medication Sig Start Date End Date Taking? Authorizing Provider  acetaminophen  (TYLENOL ) 500 MG tablet Take 1,000 mg by mouth every 6 (six) hours as needed for moderate pain.    Yes [provider]  albuterol  (PROVENTIL ) (2.5 MG/3ML) 0.083% nebulizer solution INHALE 3 ML BY NEBULIZATION EVERY 6 HOURS AS NEEDED FOR WHEEZING OR SHORTNESS OF BREATH 07/27/22  Yes Jason Leita Repine, FNP  albuterol  (VENTOLIN  HFA) 108 (90 Base) MCG/ACT inhaler TAKE 2 PUFFS BY MOUTH EVERY 6 HOURS AS NEEDED FOR WHEEZE OR SHORTNESS OF BREATH 02/05/23  Yes Kassie Acquanetta Bradley, MD  benztropine  (COGENTIN ) 0.5 MG tablet Take 1 tablet (0.5 mg total) by mouth 2 (two) times daily. As needed for side effects of Prolixin  Patient taking differently: Take 0.5 mg by mouth 2 (two) times daily. 06/16/23  Yes Eappen, Saramma, MD  celecoxib  (CELEBREX ) 50 MG capsule TAKE 1 CAPSULE (50 MG TOTAL) BY MOUTH 2 (TWO) TIMES DAILY AS NEEDED FOR PAIN. 11/25/22  Yes Onita Duos, MD  cetirizine  (ZYRTEC ) 10 MG tablet  Take 1 tablet (10 mg total) by mouth daily. Patient taking differently: Take 10 mg by mouth every evening. 06/27/23  Yes Soldatova, Liuba, MD  Cyanocobalamin  (VITAMIN B-12 PO) Take 1 tablet by mouth daily.   Yes [provider]  cyclobenzaprine  (FLEXERIL ) 10 MG tablet TAKE 1 TABLET BY MOUTH EVERYDAY AT BEDTIME 12/12/23  Yes Jason Leita Repine, FNP  diclofenac  Sodium (VOLTAREN ) 1 % GEL Apply 2 g topically 4 (four) times daily as needed (pain). 02/08/23  Yes Jason Leita Repine, FNP  esomeprazole  (NEXIUM ) 40 MG capsule TAKE 1 CAPSULE BY MOUTH EVERY DAY 11/11/23  Yes Jason Leita Repine, FNP  fluPHENAZine  (PROLIXIN ) 10 MG tablet TAKE 1 TABLET BY MOUTH EVERY DAY Patient taking differently: Take 10 mg by mouth every evening. 11/11/23  Yes Eappen, Saramma, MD  fluticasone  (FLONASE ) 50 MCG/ACT nasal spray Place 2 sprays into both nostrils daily. 06/27/23  Yes Soldatova, Liuba, MD  gabapentin  (NEURONTIN ) 300 MG capsule TAKE 1 CAPSULE BY MOUTH TWICE A DAY 01/04/24  Yes Jason Leita Repine, FNP  hydrOXYzine  (VISTARIL ) 25 MG capsule Take 1 capsule (25 mg total) by mouth every 8 (eight) hours as needed for anxiety. 01/09/24  Yes Eappen, Saramma, MD  sildenafil  (VIAGRA ) 100 MG tablet Take 100 mg by mouth daily as needed for erectile dysfunction. 10/28/20  Yes [provider]  SYMBICORT  160-4.5 MCG/ACT inhaler INHALE 2 PUFFS INTO THE LUNGS IN THE MORNING AND 2 PUFFS AT BEDTIME 12/15/22  Yes Baxley,  Ronal PARAS, MD  tamsulosin  (FLOMAX ) 0.4 MG CAPS capsule Take 0.8 mg by mouth daily. 06/09/23  Yes [provider]  traMADol  (ULTRAM ) 50 MG tablet Take 1 tablet (50 mg total) by mouth every 6 (six) hours as needed. Patient taking differently: Take 50 mg by mouth every 6 (six) hours as needed for moderate pain (pain score 4-6). 12/31/22  Yes Bernard Drivers, MD  traZODone  (DESYREL ) 100 MG tablet TAKE 1/2 TO 1 TABLET (50-100 MG TOTAL) BY MOUTH AT BEDTIME AS NEEDED FOR SLEEP 12/22/23  Yes Eappen,  Saramma, MD  VITAMIN D  PO Take 1 tablet by mouth daily.   Yes [provider]  amLODipine  (NORVASC ) 10 MG tablet Take 1 tablet (10 mg total) by mouth daily. 01/15/24   Arlice Reichert, MD  aspirin  EC 81 MG tablet Take 1 tablet (81 mg total) by mouth daily. Swallow whole. 01/15/24   Arlice Reichert, MD  carvedilol (COREG) 12.5 MG tablet Take 1 tablet (12.5 mg total) by mouth 2 (two) times daily with a meal. 01/14/24   Dahal, Reichert, MD  EPINEPHrine  0.3 mg/0.3 mL IJ SOAJ injection Inject 0.3 mg into the muscle as needed for anaphylaxis. 05/28/23   Theadore Ozell HERO, MD  furosemide  (LASIX ) 20 MG tablet Take 1 tablet (20 mg total) by mouth daily as needed for fluid or edema. Take 1 tablet daily as directed; may take 2nd pill in as needed for edema 01/14/24   Dahal, Reichert, MD  potassium chloride  SA (KLOR-CON  M) 20 MEQ tablet Take 1 tablet (20 mEq total) by mouth daily as needed. On the day Lasix  is taken 01/14/24   Arlice Reichert, MD  predniSONE  (STERAPRED UNI-PAK 21 TAB) 5 MG (21) TBPK tablet Take 5 mg by mouth See admin instructions. Per dose pack Patient not taking: Reported on 01/13/2024 01/03/24   [provider]  rosuvastatin  (CRESTOR ) 40 MG tablet Take 1 tablet (40 mg total) by mouth daily. 01/15/24   Arlice Reichert, MD    Allergies: Iohexol , Ivp dye [iodinated contrast media], Metoclopramide hcl, Ritalin [methylphenidate], and Wellbutrin [bupropion]    Review of Systems  Cardiovascular:  Positive for chest pain.  All other systems reviewed and are negative.   Updated Vital Signs BP (!) 159/91   Pulse 91   Temp 98.3 F (36.8 C) (Oral)   Resp 18   Ht 5' 7.8 (1.722 m)   Wt 115.3 kg   SpO2 99%   BMI 38.87 kg/m   Physical Exam Vitals and nursing note reviewed.   Gen: NAD Eyes: PERRL, EOMI HEENT: no oropharyngeal swelling Neck: trachea midline Resp: wheezes throughout bilateral lung fields Card: RRR, no murmurs, rubs, or gallops Abd: nontender, nondistended Extremities: no  calf tenderness, no edema Vascular: 2+ radial pulses bilaterally, 2+ DP pulses bilaterally Skin: no rashes Psyc: acting appropriately   (all labs ordered are listed, but only abnormal results are displayed) Labs Reviewed  BASIC METABOLIC PANEL WITH GFR - Abnormal; Notable for the following components:      Result Value   Potassium 3.3 (*)    BUN 5 (*)    Creatinine, Ser 1.44 (*)    Calcium  8.8 (*)    GFR, Estimated 54 (*)    All other components within normal limits  MAGNESIUM  - Abnormal; Notable for the following components:   Magnesium  1.6 (*)    All other components within normal limits  BASIC METABOLIC PANEL WITH GFR - Abnormal; Notable for the following components:   Potassium 3.0 (*)  BUN 5 (*)    Creatinine, Ser 1.40 (*)    Calcium  8.4 (*)    GFR, Estimated 55 (*)    All other components within normal limits  LIPOPROTEIN A (LPA) - Abnormal; Notable for the following components:   Lipoprotein (a) 100.3 (*)    All other components within normal limits  CREATININE, SERUM - Abnormal; Notable for the following components:   Creatinine, Ser 1.41 (*)    GFR, Estimated 55 (*)    All other components within normal limits  CBC  BRAIN NATRIURETIC PEPTIDE  HIV ANTIBODY (ROUTINE TESTING W REFLEX)  CBC  MAGNESIUM   GLUCOSE, CAPILLARY  TROPONIN I (HIGH SENSITIVITY)  TROPONIN I (HIGH SENSITIVITY)    EKG: EKG Interpretation Date/Time:  Friday January 13 2024 09:47:23 EDT Ventricular Rate:  80 PR Interval:  186 QRS Duration:  89 QT Interval:  402 QTC Calculation: 464 R Axis:   10  Text Interpretation: Sinus rhythm Abnormal R-wave progression, early transition Confirmed by Ula Barter [phone]) on 01/13/2024 10:15:39 AM  Radiology: ECHOCARDIOGRAM COMPLETE Result Date: 01/14/2024    ECHOCARDIOGRAM REPORT   Patient Name:   [name] Date of Exam: 01/14/2024 Medical Rec #:  [id number]      Height:       67.8 in Accession #:    [id number]     Weight:       254.1 lb Date of  Birth:  01-27-1956     BSA:          2.258 m Patient Age:    67 years       BP:           180/104 mmHg Patient Gender: M              HR:           78 bpm. Exam Location:  Inpatient Procedure: 2D Echo, Cardiac Doppler and Color Doppler (Both Spectral and Color            Flow Doppler were utilized during procedure). Indications:    Chest Pain R07.9  History:        Patient has no prior history of Echocardiogram examinations.                 COPD, Signs/Symptoms:Murmur and Shortness of Breath; Risk                 Factors:Hypertension and Current Smoker. Hyperlipidemia.  Sonographer:    BERNARDA ROCKS Referring Phys: [id number] DAVID W HARDING IMPRESSIONS  1. Left ventricular ejection fraction, by estimation, is 60 to 65%. The left ventricle has normal function. The left ventricle has no regional wall motion abnormalities. Left ventricular diastolic parameters are consistent with Grade I diastolic dysfunction (impaired relaxation).  2. Right ventricular systolic function is normal. The right ventricular size is normal. There is normal pulmonary artery systolic pressure. The estimated right ventricular systolic pressure is 30.0 mmHg.  3. The mitral valve is normal in structure. No evidence of mitral valve regurgitation. No evidence of mitral stenosis.  4. The aortic valve is tricuspid. There is mild calcification of the aortic valve. There is mild thickening of the aortic valve. Aortic valve regurgitation is mild. Aortic valve sclerosis is present, with no evidence of aortic valve stenosis. Aortic valve area, by VTI measures 1.86 cm. Aortic valve mean gradient measures 8.0 mmHg. Aortic valve Vmax measures 2.17 m/s.  5. The inferior vena cava is normal in size with greater than 50% respiratory variability, suggesting right  atrial pressure of 3 mmHg. FINDINGS  Left Ventricle: Left ventricular ejection fraction, by estimation, is 60 to 65%. The left ventricle has normal function. The left ventricle has no regional wall motion  abnormalities. The left ventricular internal cavity size was normal in size. There is  no left ventricular hypertrophy. Left ventricular diastolic parameters are consistent with Grade I diastolic dysfunction (impaired relaxation). Right Ventricle: The right ventricular size is normal. No increase in right ventricular wall thickness. Right ventricular systolic function is normal. There is normal pulmonary artery systolic pressure. The tricuspid regurgitant velocity is 2.60 m/s, and  with an assumed right atrial pressure of 3 mmHg, the estimated right ventricular systolic pressure is 30.0 mmHg. Left Atrium: Left atrial size was normal in size. Right Atrium: Right atrial size was normal in size. Pericardium: There is no evidence of pericardial effusion. Mitral Valve: The mitral valve is normal in structure. No evidence of mitral valve regurgitation. No evidence of mitral valve stenosis. MV peak gradient, 5.7 mmHg. The mean mitral valve gradient is 3.0 mmHg. Tricuspid Valve: The tricuspid valve is normal in structure. Tricuspid valve regurgitation is trivial. No evidence of tricuspid stenosis. Aortic Valve: The aortic valve is tricuspid. There is mild calcification of the aortic valve. There is mild thickening of the aortic valve. Aortic valve regurgitation is mild. Aortic regurgitation PHT measures 130 msec. Aortic valve sclerosis is present,  with no evidence of aortic valve stenosis. Aortic valve mean gradient measures 8.0 mmHg. Aortic valve peak gradient measures 18.8 mmHg. Aortic valve area, by VTI measures 1.86 cm. Pulmonic Valve: The pulmonic valve was normal in structure. Pulmonic valve regurgitation is not visualized. No evidence of pulmonic stenosis. Aorta: The aortic root is normal in size and structure. Venous: The inferior vena cava is normal in size with greater than 50% respiratory variability, suggesting right atrial pressure of 3 mmHg. IAS/Shunts: No atrial level shunt detected by color flow Doppler.   LEFT VENTRICLE PLAX 2D LVIDd:         4.81 cm      Diastology LVIDs:         3.07 cm      LV e' medial:    5.00 cm/s LV PW:         0.99 cm      LV E/e' medial:  16.1 LV IVS:        0.99 cm      LV e' lateral:   10.10 cm/s LVOT diam:     2.12 cm      LV E/e' lateral: 8.0 LV SV:         76 LV SV Index:   33 LVOT Area:     3.53 cm  LV Volumes (MOD) LV vol d, MOD A2C: 135.0 ml LV vol d, MOD A4C: 220.0 ml LV vol s, MOD A2C: 47.7 ml LV vol s, MOD A4C: 75.9 ml LV SV MOD A2C:     87.3 ml LV SV MOD A4C:     220.0 ml LV SV MOD BP:      114.8 ml RIGHT VENTRICLE             IVC RV Basal diam:  3.07 cm     IVC diam: 1.95 cm RV S prime:     15.10 cm/s TAPSE (M-mode): 2.8 cm RVSP:           30.0 mmHg LEFT ATRIUM             Index  RIGHT ATRIUM           Index LA diam:        4.10 cm 1.82 cm/m   RA Pressure: 3.00 mmHg LA Vol (A2C):   39.2 ml 17.36 ml/m  RA Area:     10.10 cm LA Vol (A4C):   53.0 ml 23.47 ml/m  RA Volume:   19.00 ml  8.41 ml/m LA Biplane Vol: 50.2 ml 22.23 ml/m  AORTIC VALVE                     PULMONIC VALVE AV Area (Vmax):    1.90 cm      PV Vmax:       1.01 m/s AV Area (Vmean):   1.88 cm      PV Peak grad:  4.1 mmHg AV Area (VTI):     1.86 cm AV Vmax:           217.00 cm/s AV Vmean:          135.000 cm/s AV VTI:            0.406 m AV Peak Grad:      18.8 mmHg AV Mean Grad:      8.0 mmHg LVOT Vmax:         117.00 cm/s LVOT Vmean:        71.800 cm/s LVOT VTI:          0.214 m LVOT/AV VTI ratio: 0.53 AI PHT:            130 msec  AORTA Ao Root diam: 2.77 cm Ao Asc diam:  3.72 cm MITRAL VALVE                TRICUSPID VALVE MV Area (PHT): 4.57 cm     TR Peak grad:   27.0 mmHg MV Area VTI:   2.88 cm     TR Mean grad:   26.0 mmHg MV Peak grad:  5.7 mmHg     TR Vmax:        260.00 cm/s MV Mean grad:  3.0 mmHg     Estimated RAP:  3.00 mmHg MV Vmax:       1.19 m/s     RVSP:           30.0 mmHg MV Vmean:      75.3 cm/s MV Decel Time: 166 msec     SHUNTS MV E velocity: 80.40 cm/s   Systemic VTI:  0.21 m MV  A velocity: 115.00 cm/s  Systemic Diam: 2.12 cm MV E/A ratio:  0.70 Oneil Parchment MD Electronically signed by Oneil Parchment MD Signature Date/Time: 01/14/2024/11:35:35 AM    Final      Procedures   Medications Ordered in the ED  labetalol  (NORMODYNE ) injection 10 mg (has no administration in time range)  hydrALAZINE  (APRESOLINE ) injection 10 mg (has no administration in time range)  0.9 %  sodium chloride  infusion (0 mLs Intravenous Stopped 01/14/24 1410)  ipratropium-albuterol  (DUONEB) 0.5-2.5 (3) MG/3ML nebulizer solution 3 mL (3 mLs Nebulization Given 01/13/24 1021)  oxyCODONE -acetaminophen  (PERCOCET/ROXICET) 5-325 MG per tablet 1 tablet (1 tablet Oral Given 01/13/24 1021)  free water  250 mL (250 mLs Oral Given 01/13/24 1501)  heparin  bolus via infusion 4,000 Units (4,000 Units Intravenous Bolus from Bag 01/13/24 1425)  furosemide  (LASIX ) injection 40 mg (40 mg Intravenous Given 01/13/24 1421)  potassium chloride  SA (KLOR-CON  M) CR tablet 40 mEq (40 mEq Oral Given 01/13/24 1422)  aspirin  chewable tablet  81 mg (81 mg Oral Given 01/13/24 1505)  nitroGLYCERIN  (NITROSTAT ) SL tablet 0.4 mg (0.4 mg Sublingual Given 01/13/24 2054)  magnesium  sulfate IVPB 4 g 100 mL (0 g Intravenous Stopped 01/14/24 1410)                                    Medical Decision Making 68 year old male with past medical history of hypertension, hyperlipidemia, and tobacco abuse presenting to the emergency department today with concern for chest pressure/pain.  I will further evaluate the patient here with basic labs Wels and EKG, chest x-ray, and troponin for further evaluation for ACS, pulmonary edema, pulmonary infiltrates, or pneumothorax.  Based on description of his symptoms and reassuring vital signs suspicion for pulmonary embolism or aortic dissection is low.  Does have some wheezing here on exam.  Will give him a DuoNeb here to see if this helps with his symptoms.  He denies any other infectious symptoms.  I will reevaluate  for ultimate disposition.  The patient's initial troponin is negative.  He is evaluated by cardiology who recommends admission for cardiac catheterization.  The patient is admitted for further evaluation.  Amount and/or Complexity of Data Reviewed Labs: ordered. Radiology: ordered.  Risk Prescription drug management. Decision regarding hospitalization.        Final diagnoses:  Chest pain, unspecified type    ED Discharge Orders          Ordered    rosuvastatin  (CRESTOR ) 40 MG tablet  Daily        01/14/24 1313    amLODipine  (NORVASC ) 10 MG tablet  Daily        01/14/24 1313    aspirin  EC 81 MG tablet  Daily        01/14/24 1313    furosemide  (LASIX ) 20 MG tablet  Daily PRN        01/14/24 1313    potassium chloride  SA (KLOR-CON  M) 20 MEQ tablet  Daily PRN        01/14/24 1313    carvedilol (COREG) 12.5 MG tablet  2 times daily with meals        01/14/24 1313    Increase activity slowly        01/14/24 1313    Call MD for:  temperature >100.4        01/14/24 1313    Call MD for:  persistant nausea and vomiting        01/14/24 1313    Call MD for:  severe uncontrolled pain        01/14/24 1313    Call MD for:  difficulty breathing, headache or visual disturbances        01/14/24 1313    Call MD for:  hives        01/14/24 1313    Call MD for:  persistant dizziness or light-headedness        01/14/24 1313    Call MD for:  extreme fatigue        01/14/24 1313    Discharge instructions       Comments: Recommendations at discharge:   You have been started on aspirin , carvedilol  Crestor  dose has been increased  Take Lasix  with potassium only as needed for leg swelling, shortness of breath  Valsartan  has been stopped.    General discharge instructions: Follow with Primary MD Elnor Lauraine BRAVO, NP in 7 days  Please request  your PCP  to go over your hospital tests, procedures, radiology results at the follow up. Please get your medicines reviewed and adjusted.   Your PCP may decide to repeat certain labs or tests as needed. Do not drive, operate heavy machinery, perform activities at heights, swimming or participation in water  activities or provide baby sitting services if your were admitted for syncope or siezures until you have seen by Primary MD or a Neurologist and advised to do so again.   Controlled Substance Reporting System database was reviewed. Do not drive, operate heavy machinery, perform activities at heights, swim, participate in water  activities or provide baby-sitting services while on medications for pain, sleep and mood until your outpatient physician has reevaluated you and advised to do so again.  You are strongly recommended to comply with the dose, frequency and duration of prescribed medications. Activity: As tolerated with Full fall precautions use walker/cane & assistance as needed Avoid using any recreational substances like cigarette, tobacco, alcohol, or non-prescribed drug. If you experience worsening of your admission symptoms, develop shortness of breath, life threatening emergency, suicidal or homicidal thoughts you must seek medical attention immediately by calling 911 or calling your MD immediately  if symptoms less severe. You must read complete instructions/literature along with all the possible adverse reactions/side effects for all the medicines you take and that have been prescribed to you. Take any new medicine only after you have completely understood and accepted all the possible adverse reactions/side effects.  Wear Seat belts while driving. You were cared for by a hospitalist during your hospital stay. If you have any questions about your discharge medications or the care you received while you were in the hospital after you are discharged, you can call the unit and ask to speak with the hospitalist or the covering physician. Once you are discharged, your primary care physician will handle any further medical  issues. Please note that NO REFILLS for any discharge medications will be authorized once you are discharged, as it is imperative that you return to your primary care physician (or establish a relationship with a primary care physician if you do not have one).   01/14/24 1313    Diet - low sodium heart healthy        01/14/24 1313               Ula Prentice SAUNDERS, MD 01/15/24 2242

## 2024-01-13 NOTE — ED Triage Notes (Signed)
 Pt bib ems from [facility] for a routine check up. 0730 began having cp, reported it at appointment and was advised to come to ER. NSR with PVC on EKG. 324mg  of ASA given by EMS. nitroglycerin  held due to recent ED meds taken.

## 2024-01-13 NOTE — ED Notes (Signed)
Pt placed onto cardiac monitor

## 2024-01-14 ENCOUNTER — Inpatient Hospital Stay (HOSPITAL_COMMUNITY)

## 2024-01-14 DIAGNOSIS — R079 Chest pain, unspecified: Secondary | ICD-10-CM

## 2024-01-14 DIAGNOSIS — I200 Unstable angina: Secondary | ICD-10-CM | POA: Diagnosis not present

## 2024-01-14 LAB — BASIC METABOLIC PANEL WITH GFR
Anion gap: 11 (ref 5–15)
BUN: 5 mg/dL — ABNORMAL LOW (ref 8–23)
CO2: 28 mmol/L (ref 22–32)
Calcium: 8.4 mg/dL — ABNORMAL LOW (ref 8.9–10.3)
Chloride: 100 mmol/L (ref 98–111)
Creatinine, Ser: 1.4 mg/dL — ABNORMAL HIGH (ref 0.61–1.24)
GFR, Estimated: 55 mL/min — ABNORMAL LOW (ref >60)
Glucose, Bld: 93 mg/dL (ref 70–99)
Potassium: 3 mmol/L — ABNORMAL LOW (ref 3.5–5.1)
Sodium: 139 mmol/L (ref 135–145)

## 2024-01-14 LAB — ECHOCARDIOGRAM COMPLETE
AR max vel: 1.9 cm2
AV Area VTI: 1.86 cm2
AV Area mean vel: 1.88 cm2
AV Mean grad: 8 mmHg
AV Peak grad: 18.8 mmHg
Ao pk vel: 2.17 m/s
Area-P 1/2: 4.57 cm2
Calc EF: 65.4 %
Height: 67.795 in
MV VTI: 2.88 cm2
P 1/2 time: 130 ms
S' Lateral: 3.07 cm
Single Plane A2C EF: 64.7 %
Single Plane A4C EF: 65.5 %
Weight: 4065.6 [oz_av]

## 2024-01-14 LAB — GLUCOSE, CAPILLARY: Glucose-Capillary: 79 mg/dL (ref 70–99)

## 2024-01-14 LAB — MAGNESIUM: Magnesium: 2.3 mg/dL (ref 1.7–2.4)

## 2024-01-14 MED ORDER — POTASSIUM CHLORIDE CRYS ER 20 MEQ PO TBCR: 20.0000 meq | EXTENDED_RELEASE_TABLET | Freq: Every day | ORAL | Status: AC | PRN

## 2024-01-14 MED ORDER — FUROSEMIDE 20 MG PO TABS: 20.0000 mg | ORAL_TABLET | Freq: Every day | ORAL | Status: DC | PRN

## 2024-01-14 MED ORDER — CARVEDILOL 12.5 MG PO TABS: 12.5000 mg | ORAL_TABLET | Freq: Two times a day (BID) | ORAL | 0 refills | Status: AC

## 2024-01-14 MED ORDER — CARVEDILOL 12.5 MG PO TABS: 12.5000 mg | ORAL_TABLET | Freq: Two times a day (BID) | ORAL | Status: DC

## 2024-01-14 MED ORDER — ROSUVASTATIN CALCIUM 40 MG PO TABS: 40.0000 mg | ORAL_TABLET | Freq: Every day | ORAL | 0 refills | Status: DC

## 2024-01-14 MED ORDER — ASPIRIN 81 MG PO TBEC: 81.0000 mg | DELAYED_RELEASE_TABLET | Freq: Every day | ORAL | 0 refills | Status: AC

## 2024-01-14 MED ORDER — CARVEDILOL 12.5 MG PO TABS
12.5000 mg | ORAL_TABLET | Freq: Two times a day (BID) | ORAL | Status: DC
  Administered 2024-01-14: 12.5 mg via ORAL
  Filled 2024-01-14: qty 1

## 2024-01-14 MED ORDER — AMLODIPINE BESYLATE 10 MG PO TABS: 10.0000 mg | ORAL_TABLET | Freq: Every day | ORAL | 0 refills | Status: DC

## 2024-01-14 NOTE — Progress Notes (Signed)
  Progress Note  Patient Name: [name] Date of Encounter: 01/14/2024 [facility] Cardiologist: Georganna Archer, MD   Interval Summary   Arm has been hurting for a few days. Cath reassuring. ECHO reassuring.  Currently sleeping comfortably.  Vital Signs Vitals:   01/14/24 0555 01/14/24 0810 01/14/24 0931 01/14/24 1001  BP:  (!) 155/106 (!) 149/85   Pulse:  85 91 79  Resp:  18    Temp:  98.3 F (36.8 C)    TempSrc:  Oral    SpO2:  98% 96% 99%  Weight: 115.3 kg     Height:        Intake/Output Summary (Last 24 hours) at 01/14/2024 1126 Last data filed at 01/14/2024 0914 Gross per 24 hour  Intake 461.59 ml  Output --  Net 461.59 ml      01/14/2024    5:55 AM 01/13/2024    5:55 PM 01/13/2024    9:40 AM  Last 3 Weights  Weight (lbs) 254 lb 1.6 oz 256 lb 9.9 oz 258 lb  Weight (kg) 115.259 kg 116.4 kg 117.028 kg      Telemetry/ECG  No adverse arrhythmias- Personally Reviewed  Physical Exam  GEN: No acute distress.   Neck: No JVD Cardiac: RRR, no murmurs, rubs, or gallops.  Respiratory: Clear to auscultation bilaterally. GI: Soft, nontender, non-distended  MS: No edema  Assessment & Plan   Chest pain concerning for ACS on presentation  Cardiac catheterization performed on 01/13/2024 overall reassuring   Prox RCA lesion is 30% stenosed.   Mid RCA lesion is 30% stenosed.   Prox LAD to Mid LAD lesion is 40% stenosed.   The left ventricular systolic function is normal.   LV end diastolic pressure is mildly elevated.   Dominance: Co-dominant      Angiographically minimal CAD with no obvious culprit lesion to explain symptoms. Considered non--Epicardial Coronary Artery Disease as etiology for chest pain Systemic hypertension-SBP in 160s over 90s); LVEDP of 17 mmHg Very tortuous innominate artery making catheter manipulation very difficult from right radial access.      RECOMMENDATIONS   Anticipated discharge date to be determined.   Recommend  titration of antihypertensive agents and consider different etiology for chest pain.   Recommend Aspirin  81mg  daily for moderate CAD.       Alm Clay, MD  Echocardiogram reassuring with normal ejection fraction, mild aortic insufficiency which should be of no clinical significance.  No pericardial effusion.  Noncardiac etiology for chest discomfort. -Okay to continue with aspirin  81 mg, rosuvastatin  40 mg for back stabilization.  Hypertension - On amlodipine  10 mg a day, may need further agents. -Will start carvedilol 12.5 mg twice a day  Chronic kidney disease stage III A -Creatinine has been in the 1.3-1.6 range for several years, dating back to 2017 in fact -Avoid NSAIDs.  Schizophrenia -Per psychiatry  We will go ahead and sign off.  No need for outpatient cardiology follow-up.  Continue with primary care.   For questions or updates, please contact [facility] Please consult [email or web address] for contact info under         Signed, Oneil Parchment, MD

## 2024-01-14 NOTE — Progress Notes (Shared)
 TR Band at right radial off, new dressing intact, no issues noted at this time, will continue to monitor

## 2024-01-14 NOTE — Discharge Summary (Signed)
 Physician Discharge Summary  [name] FMW:[id number] DOB: 05-29-1955 DOA: 01/13/2024  PCP: Elnor Lauraine BRAVO, NP  Admit date: 01/13/2024 Discharge date: 01/14/2024  Admitted from: Home Discharge disposition: Home  Recommendations at discharge:  You have been started on aspirin , carvedilol Crestor  dose has been increased Take Lasix  with potassium only as needed for leg swelling, shortness of breath Valsartan  has been stopped.      Brief narrative: [name] is a 68 y.o. male with PMH significant for HTN, HLD, chronic smoking, COPD, CKD, schizophrenia. He was recently seen in the ED on 8/25 for chest pain.  Workup unremarkable and was discharged to follow-up with cardiology as an outpatient.   9/26, seen at cardiology clinic as a new referral.  Reported substernal chest pain on the morning of presentation and hence sent to the ED for evaluation.  Of note, recent CT scan for lung cancer screening had incidentally revealed severe three-vessel coronary artery calcification and aortic calcifications. States arm has been hurting for two days and chest pain started this morning upon waking up.   In the ED, patient was hemodynamically stable Troponin normal.  EKG without significant ST changes Chest x-ray negative  Seen by cardiology.  Started on heparin  drip Admitted to [facility]. 9/26, underwent cardiac cath and found to have only 30% stenosis of proximal RCA, mid RCA, 40% stenosis of proximal LAD to mid LAD.  Angiographically minimal CAD with no culprit lesion.  Subjective: Patient was seen and examined this morning. Elderly African-American male.  Sitting up in bed.  Not in distress. Cath reassuring.  Echo reassuring.  Ambulated to the bathroom earlier without symptoms Chart reviewed Remains afebrile, heart rate 80s to 90s, blood pressure elevated to 160s and 180s this morning, breathing room air  Assessment and plan: Unstable angina  Moderate CAD 9/26, presented to cardiology  clinic with chest pain in the setting of multiple CAD risk factors and recent finding of severe 3V CAC on CT scan Troponin not elevated.  EKG unremarkable. Was started on heparin  drip 9/26, underwent cardiac cath and found to have only 30% stenosis of proximal RCA, mid RCA, 40% stenosis of proximal LAD to mid LAD.  Angiographically minimal CAD with no culprit lesion. Started on aspirin  81 mg daily.  Continue to Crestor  at increased dose  Hypertension PTA meds- Lasix  20 mg daily, valsartan  320 mg daily Blood pressure running elevated this morning. Currently on carvedilol 12.5 mg twice daily and amlodipine  10 mg daily.  Prediabetes A1c 6.2 on 11/18/2023 Recommend lifestyle changes and dietary modifications Recent Labs  Lab 01/14/24 0747  GLUCAP 79   Hyperlipidemia Previously on Crestor  10 mg daily.  Currently on Crestor  40 mg daily  COPD Chronic smoking Patient with 1.5 pack/day for 30 years.   Counseled to quit Bronchodilators as needed  Schizophrenia PTA meds-per psychiatry  Prostate cancer BPH Patient reports he follows with [facility] and they are actively monitoring his prostate cancer.   Continue outpatient workup for this. Continue Flomax  for BPH  Arthritis Neurontin , celecoxib , Voltaren  gel  Diet:  Diet Order             Diet - low sodium heart healthy           Diet Heart Room service appropriate? Yes; Fluid consistency: Thin  Diet effective now                   Nutritional status:  Body mass index is 38.87 kg/m.  Wounds:  -    Discharge Medications:   Allergies as of 01/14/2024       Reactions   Iohexol  Other (See Comments)   Affects nerves: triggers schizophrenia   Ivp Dye [iodinated Contrast Media]     triggers schizophrenia   Metoclopramide Hcl Other (See Comments)   Affects nerves: triggers schizophrenia   Ritalin [methylphenidate]     triggers schizophrenia   Wellbutrin [bupropion] Other (See Comments)   Makes pt  smoke heavily        Medication List     STOP taking these medications    valsartan  320 MG tablet Commonly known as: DIOVAN        TAKE these medications    acetaminophen  500 MG tablet Commonly known as: TYLENOL  Take 1,000 mg by mouth every 6 (six) hours as needed for moderate pain.   albuterol  (2.5 MG/3ML) 0.083% nebulizer solution Commonly known as: PROVENTIL  INHALE 3 ML BY NEBULIZATION EVERY 6 HOURS AS NEEDED FOR WHEEZING OR SHORTNESS OF BREATH   albuterol  108 (90 Base) MCG/ACT inhaler Commonly known as: VENTOLIN  HFA TAKE 2 PUFFS BY MOUTH EVERY 6 HOURS AS NEEDED FOR WHEEZE OR SHORTNESS OF BREATH   amLODipine  10 MG tablet Commonly known as: NORVASC  Take 1 tablet (10 mg total) by mouth daily. Start taking on: January 15, 2024   aspirin  EC 81 MG tablet Take 1 tablet (81 mg total) by mouth daily. Swallow whole. Start taking on: January 15, 2024   benztropine  0.5 MG tablet Commonly known as: COGENTIN  Take 1 tablet (0.5 mg total) by mouth 2 (two) times daily. As needed for side effects of Prolixin  What changed: additional instructions   carvedilol 12.5 MG tablet Commonly known as: COREG Take 1 tablet (12.5 mg total) by mouth 2 (two) times daily with a meal.   celecoxib  50 MG capsule Commonly known as: CELEBREX  TAKE 1 CAPSULE (50 MG TOTAL) BY MOUTH 2 (TWO) TIMES DAILY AS NEEDED FOR PAIN.   cetirizine  10 MG tablet Commonly known as: ZYRTEC  Take 1 tablet (10 mg total) by mouth daily. What changed: when to take this   cyclobenzaprine  10 MG tablet Commonly known as: FLEXERIL  TAKE 1 TABLET BY MOUTH EVERYDAY AT BEDTIME   diclofenac  Sodium 1 % Gel Commonly known as: Voltaren  Apply 2 g topically 4 (four) times daily as needed (pain).   EPINEPHrine  0.3 mg/0.3 mL Soaj injection Commonly known as: EPI-PEN Inject 0.3 mg into the muscle as needed for anaphylaxis.   esomeprazole  40 MG capsule Commonly known as: NEXIUM  TAKE 1 CAPSULE BY MOUTH EVERY DAY    fluPHENAZine  10 MG tablet Commonly known as: PROLIXIN  TAKE 1 TABLET BY MOUTH EVERY DAY What changed: when to take this   fluticasone  50 MCG/ACT nasal spray Commonly known as: FLONASE  Place 2 sprays into both nostrils daily.   furosemide  20 MG tablet Commonly known as: LASIX  Take 1 tablet (20 mg total) by mouth daily as needed for fluid or edema. Take 1 tablet daily as directed; may take 2nd pill in as needed for edema What changed:  how much to take how to take this when to take this reasons to take this   gabapentin  300 MG capsule Commonly known as: NEURONTIN  TAKE 1 CAPSULE BY MOUTH TWICE A DAY   hydrOXYzine  25 MG capsule Commonly known as: VISTARIL  Take 1 capsule (25 mg total) by mouth every 8 (eight) hours as needed for anxiety.   potassium chloride  SA 20 MEQ tablet Commonly known as: KLOR-CON  M Take 1 tablet (  20 mEq total) by mouth daily as needed. On the day Lasix  is taken What changed:  when to take this reasons to take this additional instructions   predniSONE  5 MG (21) Tbpk tablet Commonly known as: STERAPRED UNI-PAK 21 TAB Take 5 mg by mouth See admin instructions. Per dose pack   rosuvastatin  40 MG tablet Commonly known as: CRESTOR  Take 1 tablet (40 mg total) by mouth daily. Start taking on: January 15, 2024 What changed:  medication strength how much to take   sildenafil  100 MG tablet Commonly known as: VIAGRA  Take 100 mg by mouth daily as needed for erectile dysfunction.   Symbicort  160-4.5 MCG/ACT inhaler Generic drug: budesonide -formoterol  INHALE 2 PUFFS INTO THE LUNGS IN THE MORNING AND 2 PUFFS AT BEDTIME   tamsulosin  0.4 MG Caps capsule Commonly known as: FLOMAX  Take 0.8 mg by mouth daily.   traMADol  50 MG tablet Commonly known as: ULTRAM  Take 1 tablet (50 mg total) by mouth every 6 (six) hours as needed. What changed: reasons to take this   traZODone  100 MG tablet Commonly known as: DESYREL  TAKE 1/2 TO 1 TABLET (50-100 MG TOTAL) BY  MOUTH AT BEDTIME AS NEEDED FOR SLEEP   VITAMIN B-12 PO Take 1 tablet by mouth daily.   VITAMIN D  PO Take 1 tablet by mouth daily.         Follow ups:    Follow-up Information     Elnor Lauraine BRAVO, NP Follow up.   Specialty: Nurse Practitioner Contact information: [address] [place] [address] [phone]                 Discharge Instructions:   Discharge Instructions     Call MD for:  difficulty breathing, headache or visual disturbances   Complete by: As directed    Call MD for:  extreme fatigue   Complete by: As directed    Call MD for:  hives   Complete by: As directed    Call MD for:  persistant dizziness or light-headedness   Complete by: As directed    Call MD for:  persistant nausea and vomiting   Complete by: As directed    Call MD for:  severe uncontrolled pain   Complete by: As directed    Call MD for:  temperature >100.4   Complete by: As directed    Diet - low sodium heart healthy   Complete by: As directed    Discharge instructions   Complete by: As directed    Recommendations at discharge:   You have been started on aspirin , carvedilol  Crestor  dose has been increased  Take Lasix  with potassium only as needed for leg swelling, shortness of breath  Valsartan  has been stopped.    General discharge instructions: Follow with Primary MD Elnor Lauraine BRAVO, NP in 7 days  Please request your PCP  to go over your hospital tests, procedures, radiology results at the follow up. Please get your medicines reviewed and adjusted.  Your PCP may decide to repeat certain labs or tests as needed. Do not drive, operate heavy machinery, perform activities at heights, swimming or participation in water  activities or provide baby sitting services if your were admitted for syncope or siezures until you have seen by Primary MD or a Neurologist and advised to do so again. [place]  Controlled Substance Reporting System database was reviewed. Do not  drive, operate heavy machinery, perform activities at heights, swim, participate in water  activities or provide baby-sitting services while  on medications for pain, sleep and mood until your outpatient physician has reevaluated you and advised to do so again.  You are strongly recommended to comply with the dose, frequency and duration of prescribed medications. Activity: As tolerated with Full fall precautions use walker/cane & assistance as needed Avoid using any recreational substances like cigarette, tobacco, alcohol, or non-prescribed drug. If you experience worsening of your admission symptoms, develop shortness of breath, life threatening emergency, suicidal or homicidal thoughts you must seek medical attention immediately by calling 911 or calling your MD immediately  if symptoms less severe. You must read complete instructions/literature along with all the possible adverse reactions/side effects for all the medicines you take and that have been prescribed to you. Take any new medicine only after you have completely understood and accepted all the possible adverse reactions/side effects.  Wear Seat belts while driving. You were cared for by a hospitalist during your hospital stay. If you have any questions about your discharge medications or the care you received while you were in the hospital after you are discharged, you can call the unit and ask to speak with the hospitalist or the covering physician. Once you are discharged, your primary care physician will handle any further medical issues. Please note that NO REFILLS for any discharge medications will be authorized once you are discharged, as it is imperative that you return to your primary care physician (or establish a relationship with a primary care physician if you do not have one).   Increase activity slowly   Complete by: As directed        Discharge Exam:   Vitals:   01/14/24 0810 01/14/24 0931 01/14/24 1001 01/14/24 1210  BP:  (!) 155/106 (!) 149/85  (!) 173/110  Pulse: 85 91 79 93  Resp: 18   18  Temp: 98.3 F (36.8 C)     TempSrc: Oral     SpO2: 98% 96% 99% 97%  Weight:      Height:        Body mass index is 38.87 kg/m.  General exam: Pleasant, elderly African-American male Skin: No rashes, lesions or ulcers. HEENT: Atraumatic, normocephalic, no obvious bleeding Lungs: Clear to auscultation bilaterally,  CVS: S1, S2, no murmur,   GI/Abd: Soft, nontender, nondistended, bowel sound present,   CNS: Alert, awake, oriented x 3 Psychiatry: Mood appropriate Extremities: No pedal edema, no calf tenderness,    The results of significant diagnostics from this hospitalization (including imaging, microbiology, ancillary and laboratory) are listed below for reference.    Procedures and Diagnostic Studies:   ECHOCARDIOGRAM COMPLETE Result Date: 01/14/2024    ECHOCARDIOGRAM REPORT   Patient Name:   TREYVEON MOCHIZUKI Date of Exam: 01/14/2024 Medical Rec #:  [id number]      Height:       67.8 in Accession #:    [id number]     Weight:       254.1 lb Date of Birth:  1955-09-05     BSA:          2.258 m Patient Age:    67 years       BP:           180/104 mmHg Patient Gender: M              HR:           78 bpm. Exam Location:  Inpatient Procedure: 2D Echo, Cardiac Doppler and Color Doppler (Both Spectral and Color  Flow Doppler were utilized during procedure). Indications:    Chest Pain R07.9  History:        Patient has no prior history of Echocardiogram examinations.                 COPD, Signs/Symptoms:Murmur and Shortness of Breath; Risk                 Factors:Hypertension and Current Smoker. Hyperlipidemia.  Sonographer:    BERNARDA ROCKS Referring Phys: 58 DAVID W HARDING IMPRESSIONS  1. Left ventricular ejection fraction, by estimation, is 60 to 65%. The left ventricle has normal function. The left ventricle has no regional wall motion abnormalities. Left ventricular diastolic parameters are consistent with  Grade I diastolic dysfunction (impaired relaxation).  2. Right ventricular systolic function is normal. The right ventricular size is normal. There is normal pulmonary artery systolic pressure. The estimated right ventricular systolic pressure is 30.0 mmHg.  3. The mitral valve is normal in structure. No evidence of mitral valve regurgitation. No evidence of mitral stenosis.  4. The aortic valve is tricuspid. There is mild calcification of the aortic valve. There is mild thickening of the aortic valve. Aortic valve regurgitation is mild. Aortic valve sclerosis is present, with no evidence of aortic valve stenosis. Aortic valve area, by VTI measures 1.86 cm. Aortic valve mean gradient measures 8.0 mmHg. Aortic valve Vmax measures 2.17 m/s.  5. The inferior vena cava is normal in size with greater than 50% respiratory variability, suggesting right atrial pressure of 3 mmHg. FINDINGS  Left Ventricle: Left ventricular ejection fraction, by estimation, is 60 to 65%. The left ventricle has normal function. The left ventricle has no regional wall motion abnormalities. The left ventricular internal cavity size was normal in size. There is  no left ventricular hypertrophy. Left ventricular diastolic parameters are consistent with Grade I diastolic dysfunction (impaired relaxation). Right Ventricle: The right ventricular size is normal. No increase in right ventricular wall thickness. Right ventricular systolic function is normal. There is normal pulmonary artery systolic pressure. The tricuspid regurgitant velocity is 2.60 m/s, and  with an assumed right atrial pressure of 3 mmHg, the estimated right ventricular systolic pressure is 30.0 mmHg. Left Atrium: Left atrial size was normal in size. Right Atrium: Right atrial size was normal in size. Pericardium: There is no evidence of pericardial effusion. Mitral Valve: The mitral valve is normal in structure. No evidence of mitral valve regurgitation. No evidence of mitral  valve stenosis. MV peak gradient, 5.7 mmHg. The mean mitral valve gradient is 3.0 mmHg. Tricuspid Valve: The tricuspid valve is normal in structure. Tricuspid valve regurgitation is trivial. No evidence of tricuspid stenosis. Aortic Valve: The aortic valve is tricuspid. There is mild calcification of the aortic valve. There is mild thickening of the aortic valve. Aortic valve regurgitation is mild. Aortic regurgitation PHT measures 130 msec. Aortic valve sclerosis is present,  with no evidence of aortic valve stenosis. Aortic valve mean gradient measures 8.0 mmHg. Aortic valve peak gradient measures 18.8 mmHg. Aortic valve area, by VTI measures 1.86 cm. Pulmonic Valve: The pulmonic valve was normal in structure. Pulmonic valve regurgitation is not visualized. No evidence of pulmonic stenosis. Aorta: The aortic root is normal in size and structure. Venous: The inferior vena cava is normal in size with greater than 50% respiratory variability, suggesting right atrial pressure of 3 mmHg. IAS/Shunts: No atrial level shunt detected by color flow Doppler.  LEFT VENTRICLE PLAX 2D LVIDd:  4.81 cm      Diastology LVIDs:         3.07 cm      LV e' medial:    5.00 cm/s LV PW:         0.99 cm      LV E/e' medial:  16.1 LV IVS:        0.99 cm      LV e' lateral:   10.10 cm/s LVOT diam:     2.12 cm      LV E/e' lateral: 8.0 LV SV:         76 LV SV Index:   33 LVOT Area:     3.53 cm  LV Volumes (MOD) LV vol d, MOD A2C: 135.0 ml LV vol d, MOD A4C: 220.0 ml LV vol s, MOD A2C: 47.7 ml LV vol s, MOD A4C: 75.9 ml LV SV MOD A2C:     87.3 ml LV SV MOD A4C:     220.0 ml LV SV MOD BP:      114.8 ml RIGHT VENTRICLE             IVC RV Basal diam:  3.07 cm     IVC diam: 1.95 cm RV S prime:     15.10 cm/s TAPSE (M-mode): 2.8 cm RVSP:           30.0 mmHg LEFT ATRIUM             Index        RIGHT ATRIUM           Index LA diam:        4.10 cm 1.82 cm/m   RA Pressure: 3.00 mmHg LA Vol (A2C):   39.2 ml 17.36 ml/m  RA Area:     10.10  cm LA Vol (A4C):   53.0 ml 23.47 ml/m  RA Volume:   19.00 ml  8.41 ml/m LA Biplane Vol: 50.2 ml 22.23 ml/m  AORTIC VALVE                     PULMONIC VALVE AV Area (Vmax):    1.90 cm      PV Vmax:       1.01 m/s AV Area (Vmean):   1.88 cm      PV Peak grad:  4.1 mmHg AV Area (VTI):     1.86 cm AV Vmax:           217.00 cm/s AV Vmean:          135.000 cm/s AV VTI:            0.406 m AV Peak Grad:      18.8 mmHg AV Mean Grad:      8.0 mmHg LVOT Vmax:         117.00 cm/s LVOT Vmean:        71.800 cm/s LVOT VTI:          0.214 m LVOT/AV VTI ratio: 0.53 AI PHT:            130 msec  AORTA Ao Root diam: 2.77 cm Ao Asc diam:  3.72 cm MITRAL VALVE                TRICUSPID VALVE MV Area (PHT): 4.57 cm     TR Peak grad:   27.0 mmHg MV Area VTI:   2.88 cm     TR Mean grad:   26.0 mmHg MV Peak grad:  5.7 mmHg  TR Vmax:        260.00 cm/s MV Mean grad:  3.0 mmHg     Estimated RAP:  3.00 mmHg MV Vmax:       1.19 m/s     RVSP:           30.0 mmHg MV Vmean:      75.3 cm/s MV Decel Time: 166 msec     SHUNTS MV E velocity: 80.40 cm/s   Systemic VTI:  0.21 m MV A velocity: 115.00 cm/s  Systemic Diam: 2.12 cm MV E/A ratio:  0.70 Oneil Parchment MD Electronically signed by Oneil Parchment MD Signature Date/Time: 01/14/2024/11:35:35 AM    Final    CARDIAC CATHETERIZATION Result Date: 01/13/2024 Table formatting from the original result was not included. Images from the original result were not included.   Prox RCA lesion is 30% stenosed.   Mid RCA lesion is 30% stenosed.   Prox LAD to Mid LAD lesion is 40% stenosed.   The left ventricular systolic function is normal.   LV end diastolic pressure is mildly elevated. Dominance: Co-dominant  Angiographically minimal CAD with no obvious culprit lesion to explain symptoms. Considered non--Epicardial Coronary Artery Disease as etiology for chest pain Systemic hypertension-SBP in 160s over 90s); LVEDP of 17 mmHg Very tortuous innominate artery making catheter manipulation very difficult  from right radial access. RECOMMENDATIONS   Anticipated discharge date to be determined.   Recommend titration of antihypertensive agents and consider different etiology for chest pain.   Recommend Aspirin  81mg  daily for moderate CAD. Alm Clay, MD  DG Chest Port 1 View Result Date: 01/13/2024 EXAM: 1 VIEW(S) XRAY OF THE CHEST 01/13/2024 09:51:10 AM COMPARISON: 11/21/2023 CLINICAL HISTORY: CP FINDINGS: LUNGS AND PLEURA: No focal pulmonary opacity. No pulmonary edema. No pleural effusion. No pneumothorax. HEART AND MEDIASTINUM: Aortic arch atherosclerosis. No acute abnormality of the cardiac and mediastinal silhouettes. BONES AND SOFT TISSUES: Surgical clips at thoracic inlet. No acute osseous abnormality. IMPRESSION: 1. No acute cardiopulmonary process. Electronically signed by: Waddell Calk MD 01/13/2024 10:11 AM EDT RP Workstation: [id number]     Labs:   Basic Metabolic Panel: Recent Labs  Lab 01/13/24 0940 01/13/24 1931 01/14/24 0456  NA 140  --  139  K 3.3*  --  3.0*  CL 102  --  100  CO2 30  --  28  GLUCOSE 96  --  93  BUN 5*  --  5*  CREATININE 1.44* 1.41* 1.40*  CALCIUM  8.8*  --  8.4*  MG  --  1.6* 2.3   GFR Estimated Creatinine Clearance: 63.8 mL/min (A) (by C-G formula based on SCr of 1.4 mg/dL (H)). Liver Function Tests: No results for input(s): AST, ALT, ALKPHOS, BILITOT, PROT, ALBUMIN in the last 168 hours. No results for input(s): LIPASE, AMYLASE in the last 168 hours. No results for input(s): AMMONIA in the last 168 hours. Coagulation profile No results for input(s): INR, PROTIME in the last 168 hours.  CBC: Recent Labs  Lab 01/13/24 0940 01/13/24 1931  WBC 7.5 8.9  HGB 13.0 13.3  HCT 39.5 39.6  MCV 86.8 84.4  PLT 244 255   Cardiac Enzymes: No results for input(s): CKTOTAL, CKMB, CKMBINDEX, TROPONINI in the last 168 hours. BNP: Invalid input(s): POCBNP CBG: Recent Labs  Lab 01/14/24 0747  GLUCAP 79   D-Dimer No  results for input(s): DDIMER in the last 72 hours. Hgb A1c No results for input(s): HGBA1C in the last 72 hours. Lipid Profile No results for  input(s): CHOL, HDL, LDLCALC, TRIG, CHOLHDL, LDLDIRECT in the last 72 hours. Thyroid  function studies No results for input(s): TSH, T4TOTAL, T3FREE, THYROIDAB in the last 72 hours.  Invalid input(s): FREET3 Anemia work up No results for input(s): VITAMINB12, FOLATE, FERRITIN, TIBC, IRON, RETICCTPCT in the last 72 hours. Microbiology No results found for this or any previous visit (from the past 240 hours).  Time coordinating discharge: 45 minutes  Signed: Kayton Dunaj  [facility] 01/14/2024, 1:14 PM

## 2024-01-14 NOTE — Plan of Care (Signed)

## 2024-01-14 NOTE — Care Management CC44 (Signed)
 Condition Code 44 Documentation Completed  Patient Details  Name: Alexie Samson MRN: [id number] Date of Birth: 21-May-1955   Condition Code 44 given:  Yes Patient signature on Condition Code 44 notice:  Yes Documentation of 2 MD's agreement:  Yes Code 44 added to claim:  Yes    Carletha Spruce, RN 01/14/2024, 1:44 PM

## 2024-01-14 NOTE — Care Management Obs Status (Signed)
 MEDICARE OBSERVATION STATUS NOTIFICATION   Patient Details  Name: [name] MRN: [id number] Date of Birth: April 01, 1956   Medicare Observation Status Notification Given:  Yes    Carletha Spruce, RN 01/14/2024, 1:44 PM

## 2024-01-15 ENCOUNTER — Encounter (HOSPITAL_COMMUNITY): Payer: Self-pay | Admitting: Cardiology

## 2024-01-15 LAB — LIPOPROTEIN A (LPA): Lipoprotein (a): 100.3 nmol/L — ABNORMAL HIGH (ref <75.0)

## 2024-01-16 ENCOUNTER — Telehealth: Payer: Self-pay

## 2024-01-16 NOTE — Transitions of Care (Post Inpatient/ED Visit) (Signed)
 01/16/2024  Name: [name] MRN: [id number] DOB: 1956-01-21  Today's TOC FU Call Status: Today's TOC FU Call Status:: Successful TOC FU Call Completed TOC FU Call Complete Date: 01/16/24 Patient's Name and Date of Birth confirmed.  Transition Care Management Follow-up Telephone Call Date of Discharge: 01/14/24 Discharge Facility: [facility]) Type of Discharge: Inpatient Admission Primary Inpatient Discharge Diagnosis:: Unstable angina How have you been since you were released from the hospital?: Better Any questions or concerns?: No  Items Reviewed: Did you receive and understand the discharge instructions provided?: Yes Medications obtained,verified, and reconciled?: Yes (Medications Reviewed) Any new allergies since your discharge?: No Dietary orders reviewed?: NA Do you have support at home?: Yes People in Home [RPT]: spouse Name of Support/Comfort Primary Source: Doyal  Medications Reviewed Today: Medications Reviewed Today     Reviewed by Lang Avelina PARAS, CMA (Certified Medical Assistant) on 01/16/24 at 1306  Med List Status: <None>   Medication Order Taking? Sig Documenting Provider Last Dose Status Informant  acetaminophen  (TYLENOL ) 500 MG tablet [id number]  Take 1,000 mg by mouth every 6 (six) hours as needed for moderate pain.  [provider]  Active Spouse/Significant Other  albuterol  (PROVENTIL ) (2.5 MG/3ML) 0.083% nebulizer solution [id number]  INHALE 3 ML BY NEBULIZATION EVERY 6 HOURS AS NEEDED FOR WHEEZING OR SHORTNESS OF BREATH Jason Leita Repine, FNP  Active Spouse/Significant Other  albuterol  (VENTOLIN  HFA) 108 (90 Base) MCG/ACT inhaler [id number]  TAKE 2 PUFFS BY MOUTH EVERY 6 HOURS AS NEEDED FOR WHEEZE OR SHORTNESS OF SHERIDA Kassie Acquanetta Slater, MD  Active Spouse/Significant Other  amLODipine  (NORVASC ) 10 MG tablet [id number]  Take 1 tablet (10 mg total) by mouth daily. Arlice Reichert, MD  Active   aspirin  EC 81 MG tablet [id number]  Take 1  tablet (81 mg total) by mouth daily. Swallow whole. Arlice Reichert, MD  Active   benztropine  (COGENTIN ) 0.5 MG tablet [id number]  Take 1 tablet (0.5 mg total) by mouth 2 (two) times daily. As needed for side effects of Prolixin   Patient taking differently: Take 0.5 mg by mouth 2 (two) times daily.   Eappen, Saramma, MD  Active Spouse/Significant Other  carvedilol (COREG) 12.5 MG tablet [id number]  Take 1 tablet (12.5 mg total) by mouth 2 (two) times daily with a meal. Dahal, Reichert, MD  Active   celecoxib  (CELEBREX ) 50 MG capsule [id number]  TAKE 1 CAPSULE (50 MG TOTAL) BY MOUTH 2 (TWO) TIMES DAILY AS NEEDED FOR PAIN. Onita Duos, MD  Active Spouse/Significant Other  cetirizine  (ZYRTEC ) 10 MG tablet [id number]  Take 1 tablet (10 mg total) by mouth daily.  Patient taking differently: Take 10 mg by mouth every evening.   Soldatova, Liuba, MD  Active Spouse/Significant Other  Cyanocobalamin  (VITAMIN B-12 PO) [id number]  Take 1 tablet by mouth daily. [provider]  Active Spouse/Significant Other  cyclobenzaprine  (FLEXERIL ) 10 MG tablet [id number]  TAKE 1 TABLET BY MOUTH EVERYDAY AT BEDTIME Jason Leita Repine, FNP  Active Spouse/Significant Other  diclofenac  Sodium (VOLTAREN ) 1 % GEL [id number]  Apply 2 g topically 4 (four) times daily as needed (pain). Jason Leita Repine, FNP  Active Spouse/Significant Other  EPINEPHrine  0.3 mg/0.3 mL IJ SOAJ injection [id number]  Inject 0.3 mg into the muscle as needed for anaphylaxis. Bero, Michael M, MD  Active Spouse/Significant Other  esomeprazole  (NEXIUM ) 40 MG capsule [id number]  TAKE 1 CAPSULE BY MOUTH EVERY DAY Jason Leita Repine, FNP  Active Spouse/Significant Other  fluPHENAZine  (PROLIXIN ) 10 MG tablet [id number]  TAKE 1  TABLET BY MOUTH EVERY DAY  Patient taking differently: Take 10 mg by mouth every evening.   Eappen, Saramma, MD  Active Spouse/Significant Other  fluticasone  (FLONASE ) 50 MCG/ACT nasal spray [id number]  Place 2 sprays into both  nostrils daily. Soldatova, Liuba, MD  Active Spouse/Significant Other  furosemide  (LASIX ) 20 MG tablet [id number]  Take 1 tablet (20 mg total) by mouth daily as needed for fluid or edema. Take 1 tablet daily as directed; may take 2nd pill in as needed for edema Dahal, Chapman, MD  Active   gabapentin  (NEURONTIN ) 300 MG capsule [id number]  TAKE 1 CAPSULE BY MOUTH TWICE A DAY Jason Leita Repine, FNP  Active Spouse/Significant Other  hydrOXYzine  (VISTARIL ) 25 MG capsule [id number]  Take 1 capsule (25 mg total) by mouth every 8 (eight) hours as needed for anxiety. Eappen, Saramma, MD  Active Spouse/Significant Other  potassium chloride  SA (KLOR-CON  M) 20 MEQ tablet [id number]  Take 1 tablet (20 mEq total) by mouth daily as needed. On the day Lasix  is taken Arlice Chapman, MD  Active    Patient not taking:   Discontinued 01/16/24 1306 (Completed Course) rosuvastatin  (CRESTOR ) 40 MG tablet [id number]  Take 1 tablet (40 mg total) by mouth daily. Arlice Chapman, MD  Active   sildenafil  (VIAGRA ) 100 MG tablet [id number]  Take 100 mg by mouth daily as needed for erectile dysfunction. [provider]  Active Spouse/Significant Other  SYMBICORT  160-4.5 MCG/ACT inhaler [id number]  INHALE 2 PUFFS INTO THE LUNGS IN THE MORNING AND 2 PUFFS AT BEDTIME Perri Ronal PARAS, MD  Active Spouse/Significant Other  tamsulosin  (FLOMAX ) 0.4 MG CAPS capsule [id number]  Take 0.8 mg by mouth daily. [provider]  Active Spouse/Significant Other  traMADol  (ULTRAM ) 50 MG tablet [id number]  Take 1 tablet (50 mg total) by mouth every 6 (six) hours as needed.  Patient taking differently: Take 50 mg by mouth every 6 (six) hours as needed for moderate pain (pain score 4-6).   Steinl, Kevin, MD  Active Spouse/Significant Other  traZODone  (DESYREL ) 100 MG tablet [id number]  TAKE 1/2 TO 1 TABLET (50-100 MG TOTAL) BY MOUTH AT BEDTIME AS NEEDED FOR SLEEP Eappen, Saramma, MD  Active Spouse/Significant Other  VITAMIN D  PO [id number]  Take 1  tablet by mouth daily. [provider]  Active Spouse/Significant Other            Home Care and Equipment/Supplies: Were Home Health Services Ordered?: No Any new equipment or medical supplies ordered?: No  Functional Questionnaire: Do you need assistance with bathing/showering or dressing?: No Do you need assistance with meal preparation?: No Do you need assistance with eating?: No Do you have difficulty maintaining continence: No Do you need assistance with getting out of bed/getting out of a chair/moving?: No Do you have difficulty managing or taking your medications?: No  Follow up appointments reviewed: PCP Follow-up appointment confirmed?: Yes Date of PCP follow-up appointment?: 01/20/24 Follow-up Provider: Lauraine Pereyra, [facility] Follow-up appointment confirmed?: No Reason Specialist Follow-Up Not Confirmed: Patient has Specialist Provider Number and will Call for Appointment Do you need transportation to your follow-up appointment?: No Do you understand care options if your condition(s) worsen?: Yes-patient verbalized understanding    SIGNATURE Avelina Essex, CMA (AAMA)  [facility] [phone]

## 2024-01-19 ENCOUNTER — Other Ambulatory Visit: Payer: Self-pay

## 2024-01-19 ENCOUNTER — Ambulatory Visit: Admitting: Psychiatry

## 2024-01-19 ENCOUNTER — Encounter: Payer: Self-pay | Admitting: Psychiatry

## 2024-01-19 VITALS — BP 126/86 | HR 90 | Temp 97.4°F | Ht 67.8 in | Wt 259.4 lb

## 2024-01-19 DIAGNOSIS — F17200 Nicotine dependence, unspecified, uncomplicated: Secondary | ICD-10-CM | POA: Diagnosis not present

## 2024-01-19 DIAGNOSIS — G4701 Insomnia due to medical condition: Secondary | ICD-10-CM | POA: Diagnosis not present

## 2024-01-19 DIAGNOSIS — F418 Other specified anxiety disorders: Secondary | ICD-10-CM

## 2024-01-19 DIAGNOSIS — F203 Undifferentiated schizophrenia: Secondary | ICD-10-CM

## 2024-01-19 NOTE — Progress Notes (Signed)
 [facility] MD OP Progress Note  01/19/2024 9:24 AM [name]  MRN:  [id number]  Chief Complaint:  Chief Complaint  Patient presents with   Follow-up   Schizophrenia   Medication Refill   Discussed the use of AI scribe software for clinical note transcription with the patient, who gave verbal consent to proceed.  History of Present Illness [name] is a 68 year old African-American male, on disability, lives in [place] with his wife, has a history of schizophrenia, tobacco use disorder, essential hypertension, coronary artery disease, prostate cancer, chronic pain, history of multiple surgeries was evaluated in office today for a follow-up appointment.  Ongoing anxiety continues, and he manages it with hydroxyzine  as needed . He states that the medication helps calm him.he also takes Cogentin  as needed particularly when he experiences muscle spasms and that has been beneficial. He denies any thoughts about hurting himself and denies experiencing hallucinations, noting that his symptoms remain controlled as long as he takes his medication.  Sleep difficulties persist, and he takes trazodone  to address this. He reports that this medication helps him sleep.  Denies side effects.  He continues to be compliant on the Prolixin .  Denies side effects.  He was recently treated at the emergency department on January 13, 2024 for chest pain, status post left cardiac cath and angiography.  He is currently on carvedilol and aspirin .  He reports current cigarette smoking and describes efforts to cut back. He states that he began smoking as a teenager.  He reports some attempts to increase his activity, such as walking to the store and going to the gym with his wife, but experiences physical limitations due to leg pain and swelling.  Denies any suicidality, homicidality.  Denies any other concerns today.   Visit Diagnosis:    ICD-10-CM   1. Undifferentiated schizophrenia (HCC)  F20.3      2. Other specified anxiety disorders  F41.8    Generalized anxiety not occurring more days than not    3. Insomnia due to medical condition  G47.01    pain, mood    4. Tobacco use disorder  F17.200       Past Psychiatric History: I have reviewed past psychiatric history from progress note on 06/08/2021.  Multiple medication trials in the past include Navane , Prolixin .  Multiple inpatient behavioral health admissions in the past.  Past Medical History:  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 Lily  COLONOSCOPY  04/23/2010   normal   COLONOSCOPY WITH PROPOFOL   01/22/2021   poor prep[   ESOPHAGOGASTRODUODENOSCOPY N/A 12/31/2013   Procedure: ESOPHAGOGASTRODUODENOSCOPY (EGD);  Surgeon: Lupita FORBES Commander, MD;  Location: [facility];  Service: Endoscopy;  Laterality: N/A;   Excision of scalp lesion  07/18/2009   Excision of thyroid  mass     GANGLION CYST EXCISION     right foot x 2   HERNIA  REPAIR     ventral   INCISIONAL HERNIA REPAIR  12/01/2011   Procedure: LAPAROSCOPIC INCISIONAL HERNIA;  Surgeon: Vicenta DELENA Poli, MD;  Location: [facility];  Service: General;  Laterality: N/A;   LEFT HEART CATH AND CORONARY ANGIOGRAPHY N/A 01/13/2024   Procedure: LEFT HEART CATH AND CORONARY ANGIOGRAPHY;  Surgeon: Anner Alm ORN, MD;  Location: [facility] INVASIVE CV LAB;  Service: Cardiovascular;  Laterality: N/A;   SHOULDER SURGERY     right   UPPER GASTROINTESTINAL ENDOSCOPY      Family Psychiatric History: Reviewed family psychiatric history from progress normal 06/08/2021.  Family History:  Family History  Problem Relation Age of Onset   Hypertension Mother    Bone cancer Mother    Hypertension Father    Kidney disease Father    Prostate cancer Brother    Bone cancer Brother    Kidney disease Brother    Mental illness Maternal Grandmother    Mental illness Son    Prostate cancer Other    Colon cancer Neg Hx    Colon polyps Neg Hx    Esophageal cancer Neg Hx    Rectal cancer Neg Hx    Stomach cancer Neg Hx     Social History: I have reviewed social history from progress note on 06/08/2021. Social History   Socioeconomic History   Marital status: Married    Spouse name: Doyal   Number of children: 2   Years of education: Not on file   Highest education level: 12th grade  Occupational History   Occupation: Disabled    Employer: DISABLED  Tobacco Use   Smoking status: Every Day    Current packs/day: 1.50    Average packs/day: 1.5 packs/day for 54.8 years (82.1 ttl pk-yrs)    Types: Cigarettes    Start date: 1971    Passive exposure: Never   Smokeless tobacco: Never   Tobacco comments:    Started smoking at age 66,still smoking pack - 1-1/2 packs  per day 01/08/2021  Vaping Use   Vaping status: Never Used  Substance and Sexual Activity   Alcohol use: No   Drug use: Yes    Frequency: 1.0 times per week    Types: Marijuana   Sexual activity: Yes    Partners: Female   Other Topics Concern   Not on file  Social History Narrative   HSG disabled due to schizophrenia - 1983. stable long-term marriage with children. cared for his father-in-law- passed away in 19-Feb-2024   Social Drivers of Health   Financial Resource Strain: Low Risk  (12/29/2023)   Overall Financial Resource Strain (CARDIA)    Difficulty of Paying Living Expenses: Not hard at all  Food Insecurity: Patient Declined (01/13/2024)   Hunger Vital Sign    Worried About Running Out of Food in the Last Year: Patient declined    Ran Out of Food in the Last Year: Patient declined  Transportation Needs: Patient Declined (01/13/2024)   PRAPARE - Administrator, Civil Service (Medical): Patient declined    Lack  of Transportation (Non-Medical): Patient declined  Physical Activity: Insufficiently Active (12/29/2023)   Exercise Vital Sign    Days of Exercise per Week: 2 days    Minutes of Exercise per Session: 10 min  Stress: No Stress Concern Present (12/29/2023)   [employer] of Occupational Health - Occupational Stress Questionnaire    Feeling of Stress: Not at all  Social Connections: Patient Declined (01/13/2024)   Social Connection and Isolation Panel    Frequency of Communication with Friends and Family: Patient declined    Frequency of Social Gatherings with Friends and Family: Patient declined    Attends Religious Services: Patient declined    Database administrator or Organizations: Patient declined    Attends Banker Meetings: Patient declined    Marital Status: Patient declined  Recent Concern: Social Connections - Moderately Isolated (12/05/2023)   Social Connection and Isolation Panel    Frequency of Communication with Friends and Family: More than three times a week    Frequency of Social Gatherings with Friends and Family: Twice a week    Attends Religious Services: Never    Database administrator or Organizations: No    Attends Banker Meetings:  Never    Marital Status: Married    Allergies:  Allergies  Allergen Reactions   Iohexol  Other (See Comments)    Affects nerves: triggers schizophrenia   Ivp Dye [Iodinated Contrast Media]      triggers schizophrenia   Metoclopramide Hcl Other (See Comments)    Affects nerves: triggers schizophrenia   Ritalin [Methylphenidate]      triggers schizophrenia   Wellbutrin [Bupropion] Other (See Comments)    Makes pt smoke heavily    Metabolic Disorder Labs: Lab Results  Component Value Date   HGBA1C 6.2 11/18/2023   MPG 123 08/20/2022   No results found for: PROLACTIN Lab Results  Component Value Date   CHOL 154 11/18/2023   TRIG 117.0 11/18/2023   HDL 56.40 11/18/2023   CHOLHDL 3 11/18/2023   VLDL 23.4 11/18/2023   LDLCALC 74 11/18/2023   LDLCALC 99 10/24/2018   Lab Results  Component Value Date   TSH 1.98 08/20/2022   TSH 1.73 05/21/2021    Therapeutic Level Labs: No results found for: LITHIUM No results found for: VALPROATE No results found for: CBMZ  Current Medications: Current Outpatient Medications  Medication Sig Dispense Refill   acetaminophen  (TYLENOL ) 500 MG tablet Take 1,000 mg by mouth every 6 (six) hours as needed for moderate pain.      albuterol  (PROVENTIL ) (2.5 MG/3ML) 0.083% nebulizer solution INHALE 3 ML BY NEBULIZATION EVERY 6 HOURS AS NEEDED FOR WHEEZING OR SHORTNESS OF BREATH 150 mL 0   albuterol  (VENTOLIN  HFA) 108 (90 Base) MCG/ACT inhaler TAKE 2 PUFFS BY MOUTH EVERY 6 HOURS AS NEEDED FOR WHEEZE OR SHORTNESS OF BREATH 8.5 each 6   amLODipine  (NORVASC ) 10 MG tablet Take 1 tablet (10 mg total) by mouth daily. 90 tablet 0   aspirin  EC 81 MG tablet Take 1 tablet (81 mg total) by mouth daily. Swallow whole. 90 tablet 0   benztropine  (COGENTIN ) 0.5 MG tablet Take 1 tablet (0.5 mg total) by mouth 2 (two) times daily. As needed for side effects of Prolixin  (Patient taking differently: Take 0.5 mg by mouth 2 (two) times daily.) 180 tablet 1    carvedilol (COREG) 12.5 MG tablet Take 1 tablet (12.5 mg total) by mouth 2 (two) times daily with a meal. 180 tablet 0  celecoxib  (CELEBREX ) 50 MG capsule TAKE 1 CAPSULE (50 MG TOTAL) BY MOUTH 2 (TWO) TIMES DAILY AS NEEDED FOR PAIN. 60 capsule 6   cetirizine  (ZYRTEC ) 10 MG tablet Take 1 tablet (10 mg total) by mouth daily. (Patient taking differently: Take 10 mg by mouth every evening.) 30 tablet 11   Cyanocobalamin  (VITAMIN B-12 PO) Take 1 tablet by mouth daily.     cyclobenzaprine  (FLEXERIL ) 10 MG tablet TAKE 1 TABLET BY MOUTH EVERYDAY AT BEDTIME 30 tablet 1   diclofenac  Sodium (VOLTAREN ) 1 % GEL Apply 2 g topically 4 (four) times daily as needed (pain). 150 g 0   EPINEPHrine  0.3 mg/0.3 mL IJ SOAJ injection Inject 0.3 mg into the muscle as needed for anaphylaxis. 1 each 1   esomeprazole  (NEXIUM ) 40 MG capsule TAKE 1 CAPSULE BY MOUTH EVERY DAY 90 capsule 3   fluPHENAZine  (PROLIXIN ) 10 MG tablet TAKE 1 TABLET BY MOUTH EVERY DAY (Patient taking differently: Take 10 mg by mouth every evening.) 90 tablet 1   fluticasone  (FLONASE ) 50 MCG/ACT nasal spray Place 2 sprays into both nostrils daily. 16 g 6   furosemide  (LASIX ) 20 MG tablet Take 1 tablet (20 mg total) by mouth daily as needed for fluid or edema. Take 1 tablet daily as directed; may take 2nd pill in as needed for edema     gabapentin  (NEURONTIN ) 300 MG capsule TAKE 1 CAPSULE BY MOUTH TWICE A DAY 180 capsule 1   hydrOXYzine  (VISTARIL ) 25 MG capsule Take 1 capsule (25 mg total) by mouth every 8 (eight) hours as needed for anxiety. 90 capsule 1   potassium chloride  SA (KLOR-CON  M) 20 MEQ tablet Take 1 tablet (20 mEq total) by mouth daily as needed. On the day Lasix  is taken     rosuvastatin  (CRESTOR ) 40 MG tablet Take 1 tablet (40 mg total) by mouth daily. 90 tablet 0   sildenafil  (VIAGRA ) 100 MG tablet Take 100 mg by mouth daily as needed for erectile dysfunction.     SYMBICORT  160-4.5 MCG/ACT inhaler INHALE 2 PUFFS INTO THE LUNGS IN THE MORNING  AND 2 PUFFS AT BEDTIME 30.6 each 1   tamsulosin  (FLOMAX ) 0.4 MG CAPS capsule Take 0.8 mg by mouth daily.     traMADol  (ULTRAM ) 50 MG tablet Take 1 tablet (50 mg total) by mouth every 6 (six) hours as needed. (Patient taking differently: Take 50 mg by mouth every 6 (six) hours as needed for moderate pain (pain score 4-6).) 15 tablet 0   traZODone  (DESYREL ) 100 MG tablet TAKE 1/2 TO 1 TABLET (50-100 MG TOTAL) BY MOUTH AT BEDTIME AS NEEDED FOR SLEEP 90 tablet 3   VITAMIN D  PO Take 1 tablet by mouth daily.     No current facility-administered medications for this visit.     Musculoskeletal: Strength & Muscle Tone: within normal limits Gait & Station: normal Patient leans: N/A  Psychiatric Specialty Exam: Review of Systems  Psychiatric/Behavioral: Negative.      Blood pressure 126/86, pulse 90, temperature (!) 97.4 F (36.3 C), temperature source Temporal, height 5' 7.8 (1.722 m), weight 259 lb 6.4 oz (117.7 kg).Body mass index is 39.67 kg/m.  General Appearance: Casual  Eye Contact:  Fair  Speech:  Clear and Coherent  Volume:  Normal  Mood:  Euthymic  Affect:  Congruent  Thought Process:  Goal Directed and Descriptions of Associations: Intact  Orientation:  Full (Time, Place, and Person)  Thought Content: Logical   Suicidal Thoughts:  No  Homicidal Thoughts:  No  Memory:  Immediate;   Fair Recent;   Fair Remote;   Fair  Judgement:  Fair  Insight:  Fair  Psychomotor Activity:  Normal  Concentration:  Concentration: Fair and Attention Span: Fair  Recall:  [employer] of Knowledge: Fair  Language: Fair  Akathisia:  No  Handed:  Right  AIMS (if indicated): done  Assets:  Communication Skills Desire for Improvement Housing Social Support  ADL's:  Intact  Cognition: WNL  Sleep:  Fair   Screenings: Midwife Visit from 01/19/2024 in [facility] Office Visit from 09/19/2023 in [facility] Office Visit from 06/16/2023 in [facility] Office Visit from 03/16/2023 in [facility] Office Visit from 12/14/2022 in [facility]  AIMS Total Score 0 0 0 0 0   GAD-7    Flowsheet Row Office Visit from 12/30/2023 in [facility] Office Visit from 06/16/2023 in [facility] Office Visit from 03/16/2023 in [facility] Office Visit from 12/14/2022 in [facility] Office Visit from 12/16/2021 in [facility]  Total GAD-7 Score 0 2 0 3 4   PHQ2-9    Flowsheet Row Office Visit from 12/30/2023 in [facility] at [facility] Clinical Support from 12/05/2023 in [facility] at [facility] Office Visit from 09/19/2023 in [facility] Office Visit from 06/16/2023 in [facility] Office Visit from 03/16/2023 in [facility]  PHQ-2 Total Score 0 0 0 0 2  PHQ-9 Total Score 0 -- -- -- 4   Flowsheet Row ED to Hosp-Admission (Discharged) from 01/13/2024 in [facility] ED from 11/21/2023 in [facility] Emergency Department at [facility] Office Visit from 09/19/2023 in [facility]  C-SSRS RISK CATEGORY No Risk No Risk No Risk     Assessment and Plan: Author Hatlestad is a 68 year old African-American male on disability, history of schizophrenia, was evaluated in office today.  Discussed assessment and plan as noted below.  1. Undifferentiated schizophrenia (HCC)-stable Denies any significant concerns.  Well-managed on the current medication  regimen. Continue Prolixin  10 mg daily Continue Benztropine  0.5 mg twice a day as needed for side effects.  2. Other specified anxiety disorders, generalized anxiety not occurring more days than not-stable Currently well-managed anxiety symptoms hydroxyzine  does help on an as-needed basis. Continue Hydroxyzine  12.5-25 mg daily as needed for severe anxiety attacks.  3. Insomnia due to medical condition-stable Sleep problems managed on the trazodone . Continue Trazodone  50-100 mg at bedtime as needed Will need sufficient pain management.  4. Tobacco use disorder-unstable Previous trials of Chantix  however did not like it. Provided counseling for 1 minute.  Follow-up Follow-up in clinic in 3 months or sooner if needed.   Consent: Patient/Guardian gives verbal consent for treatment and assignment of benefits for services provided during this visit. Patient/Guardian expressed understanding and agreed to proceed.   This note was generated in part or whole with voice recognition software. Voice recognition is usually quite accurate but there are transcription errors that can and very often do occur. I apologize for any typographical errors that were not detected and corrected.  Etrulia Zarr, MD 01/19/2024, 9:24 AM

## 2024-01-20 ENCOUNTER — Ambulatory Visit: Admitting: Nurse Practitioner

## 2024-01-20 VITALS — BP 128/80 | HR 95 | Temp 98.3°F | Ht 67.8 in | Wt 259.0 lb

## 2024-01-20 DIAGNOSIS — E782 Mixed hyperlipidemia: Secondary | ICD-10-CM | POA: Diagnosis not present

## 2024-01-20 DIAGNOSIS — I2510 Atherosclerotic heart disease of native coronary artery without angina pectoris: Secondary | ICD-10-CM

## 2024-01-20 DIAGNOSIS — G8929 Other chronic pain: Secondary | ICD-10-CM

## 2024-01-20 DIAGNOSIS — R296 Repeated falls: Secondary | ICD-10-CM | POA: Diagnosis not present

## 2024-01-20 DIAGNOSIS — M25512 Pain in left shoulder: Secondary | ICD-10-CM | POA: Diagnosis not present

## 2024-01-20 DIAGNOSIS — R7303 Prediabetes: Secondary | ICD-10-CM

## 2024-01-20 DIAGNOSIS — I10 Essential (primary) hypertension: Secondary | ICD-10-CM

## 2024-01-20 DIAGNOSIS — J449 Chronic obstructive pulmonary disease, unspecified: Secondary | ICD-10-CM

## 2024-01-20 DIAGNOSIS — M25511 Pain in right shoulder: Secondary | ICD-10-CM

## 2024-01-20 DIAGNOSIS — M545 Low back pain, unspecified: Secondary | ICD-10-CM | POA: Diagnosis not present

## 2024-01-20 LAB — COMPREHENSIVE METABOLIC PANEL WITH GFR
ALT: 10 U/L (ref 0–53)
AST: 13 U/L (ref 0–37)
Albumin: 4.1 g/dL (ref 3.5–5.2)
Alkaline Phosphatase: 56 U/L (ref 39–117)
BUN: 6 mg/dL (ref 6–23)
CO2: 31 meq/L (ref 19–32)
Calcium: 9.5 mg/dL (ref 8.4–10.5)
Chloride: 100 meq/L (ref 96–112)
Creatinine, Ser: 1.31 mg/dL (ref 0.40–1.50)
GFR: 56.58 mL/min — ABNORMAL LOW (ref >60.00)
Glucose, Bld: 108 mg/dL — ABNORMAL HIGH (ref 70–99)
Potassium: 3.7 meq/L (ref 3.5–5.1)
Sodium: 139 meq/L (ref 135–145)
Total Bilirubin: 0.4 mg/dL (ref 0.2–1.2)
Total Protein: 6.9 g/dL (ref 6.0–8.3)

## 2024-01-20 NOTE — Assessment & Plan Note (Signed)
  Chronic obstructive pulmonary disease (COPD) in a current smoker Current smoker on Symbicort . Previously counseled to quit smoking. - Continue Symbicort  160/4.5 mcg, two puffs twice daily. - Reinforce smoking cessation counseling.

## 2024-01-20 NOTE — Assessment & Plan Note (Signed)
 Prediabetes Last HbA1c was 6.2%. No pharmacological treatment, focusing on lifestyle modification. - Monitor HbA1c periodically.

## 2024-01-20 NOTE — Assessment & Plan Note (Signed)
  History of multiple falls Most recent fall eight months ago. Uses a cane or walker due to legs and back giving out. - Encourage use of cane or walker, especially for longer distances.

## 2024-01-20 NOTE — Assessment & Plan Note (Signed)
 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 Receiving injections at [facility]. - Continue follow-up with [facility] for pain management.

## 2024-01-20 NOTE — Assessment & Plan Note (Signed)
 Hyperlipidemia - Continue rosuvastatin  40 mg daily. -Continue asa 81mg /day

## 2024-01-20 NOTE — Assessment & Plan Note (Signed)
 Chronic pain in left upper extremity (shoulder, elbow, wrist) Chronic pain with numbness in fingers, improved with heat. Plans to see orthopedist. - Offered to order X-ray of left hand, wrist, and elbow. Patient declines and would like to wait to see ortho - Encourage use of heat application for pain relief.

## 2024-01-20 NOTE — Assessment & Plan Note (Signed)
 Hypertension Blood pressure controlled at 128/80 mmHg on carvedilol and furosemide . Amlodipine  held due to edema. - Continue carvedilol 12.5 mg twice daily. - Continue furosemide  20 mg daily as needed for swelling. - Do not restart amlodipine  at this time. Follow-up in 6 weeks for monitoring of BP  Lower extremity edema Bilateral leg swelling improved with furosemide  and compression socks. Echocardiogram showed good cardiac function. Amlodipine  not restarted due to controlled blood pressure. - Encourage regular use of compression socks.

## 2024-01-20 NOTE — Progress Notes (Signed)
 Established Patient Office Visit  Subjective   Patient ID: [name], male    DOB: December 08, 1955  Age: 68 y.o. MRN: [id number]  No chief complaint on file.   Discussed the use of AI scribe software for clinical note transcription with the patient, who gave verbal consent to proceed.  History of Present Illness [name] is a 68 year old male with coronary artery disease who presents to initiate care as a new patient.  Chest pain and coronary artery disease - Hospitalized September 26-27, 2025 for chest pain - Cardiology evaluation with CT scan showed three-vessel coronary artery calcification and aortic calcifications - Troponins normal during hospitalization - EKG showed significant ST changes - Chest x-ray negative - Started on heparin  drip during hospitalization - Cardiac catheterization revealed 30% stenosis of proximal RCA and 40% stenosis of proximal to mid LAD, minimal CAD, no culprit lesion - Discharged on aspirin  81 mg daily and continued on Crestor  40 mg daily, no stent placed - Ongoing arm and chest pain  Hypertension and peripheral edema - Takes carvedilol 12.5 mg twice daily for hypertension - Stopped amlodipine  three to four weeks ago due to swelling - Uses furosemide  20 mg daily as needed and compression socks for swelling - No pain associated with swelling -Swelling is slowly improving  Prediabetes and hyperlipidemia - Last A1c in August 2025 was 6.2 - Not on pharmacological treatment for prediabetes, focusing on lifestyle modification - Continues to smoke - Continued on Crestor  40 mg daily for hyperlipidemia  Chronic obstructive pulmonary disease (copd) - Uses Symbicort  160/4.5 mcg, two puffs twice daily - Continues to smoke -Not oxygen dependant -Declines flu shot today  Lower urinary tract symptoms and prostate cancer - Follows with urology for BPH and prostate cancer - Takes Flomax  0.8 mg daily  Musculoskeletal pain and neuropathy -  Numbness in pinky finger for approximately ten years - Recent pain in shoulder, elbow, and wrist for past three months - History of falls, most recent eight months ago - Uses cane and walker for mobility - Back pain present, has degenerative disc disease       Review of Systems  Respiratory:  Negative for shortness of breath.   Cardiovascular:  Positive for leg swelling. Negative for chest pain.      Objective:     BP 128/80   Pulse 95   Temp 98.3 F (36.8 C) (Temporal)   Ht 5' 7.8 (1.722 m)   Wt 259 lb (117.5 kg)   SpO2 98%   BMI 39.61 kg/m  BP Readings from Last 3 Encounters:  01/20/24 128/80  01/14/24 (!) 159/91  01/13/24 136/88   Wt Readings from Last 3 Encounters:  01/20/24 259 lb (117.5 kg)  01/14/24 254 lb 1.6 oz (115.3 kg)  01/13/24 259 lb 9.6 oz (117.8 kg)      Physical Exam Vitals reviewed.  Constitutional:      Appearance: Normal appearance.  HENT:     Head: Normocephalic and atraumatic.  Cardiovascular:     Rate and Rhythm: Normal rate and regular rhythm.     Comments: Nonpitting mild bilateral edema Pulmonary:     Effort: Pulmonary effort is normal.     Breath sounds: Normal breath sounds.  Musculoskeletal:     Cervical back: Neck supple.     Right lower leg: Edema present.     Left lower leg: Edema present.  Skin:    General: Skin is warm and dry.  Neurological:     Mental Status:  He is alert and oriented to person, place, and time.  Psychiatric:        Mood and Affect: Mood normal.        Behavior: Behavior normal.        Thought Content: Thought content normal.        Judgment: Judgment normal.      No results found for any visits on 01/20/24.    The 10-year ASCVD risk score (Arnett DK, et al., 2019) is: 24.5%    Assessment & Plan:   Problem List Items Addressed This Visit       Cardiovascular and Mediastinum   Elevated blood pressure reading in office with diagnosis of hypertension   Hypertension Blood pressure  controlled at 128/80 mmHg on carvedilol and furosemide . Amlodipine  held due to edema. - Continue carvedilol 12.5 mg twice daily. - Continue furosemide  20 mg daily as needed for swelling. - Do not restart amlodipine  at this time. Follow-up in 6 weeks for monitoring of BP  Lower extremity edema Bilateral leg swelling improved with furosemide  and compression socks. Echocardiogram showed good cardiac function. Amlodipine  not restarted due to controlled blood pressure. - Encourage regular use of compression socks.      Coronary artery calcification   Coronary artery disease with non-obstructive stenosis Recent cardiac catheterization showed 30% stenosis in proximal RCA and 40% in proximal to mid LAD, indicating non-obstructive disease. - Continue aspirin  81 mg daily. - Continue rosuvastatin  40 mg daily. -Follow-up with cardiology        Respiratory   Chronic obstructive pulmonary disease (HCC)    Chronic obstructive pulmonary disease (COPD) in a current smoker Current smoker on Symbicort . Previously counseled to quit smoking. - Continue Symbicort  160/4.5 mcg, two puffs twice daily. - Reinforce smoking cessation counseling.        Other   Hyperlipidemia - Primary   Hyperlipidemia - Continue rosuvastatin  40 mg daily. 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 Receiving injections at [facility]. - Continue follow-up with [facility] for pain management.      Bilateral shoulder pain   Chronic pain in left upper extremity (shoulder, elbow, wrist) Chronic pain with numbness in fingers, improved with heat. Plans to see orthopedist. - Offered to order X-ray of left hand, wrist, and elbow. Patient declines and would like to wait to see ortho - Encourage use of heat application for pain relief.      Prediabetes    Prediabetes Last HbA1c was 6.2%. No pharmacological treatment, focusing on lifestyle modification. - Monitor HbA1c periodically.      Falls    History of multiple falls Most recent fall eight months ago. Uses a cane or walker due to legs and back giving out. - Encourage use of cane or walker, especially for longer distances.     Assessment and Plan Assessment & Plan Coronary artery disease with non-obstructive stenosis Recent cardiac catheterization showed 30% stenosis in proximal RCA and 40% in proximal to mid LAD, indicating non-obstructive disease. - Continue aspirin  81 mg daily. - Continue rosuvastatin  40 mg daily. -Follow-up with cardiology  Lower extremity edema Bilateral leg swelling improved with furosemide  and compression socks. Echocardiogram showed good cardiac function. Amlodipine  not restarted due to controlled blood pressure. - Encourage regular use of compression socks.  Hypertension Blood pressure controlled at 128/80 mmHg on carvedilol  and furosemide . Amlodipine  held due to edema. - Continue carvedilol 12.5 mg twice daily. - Continue furosemide  20 mg daily as needed for swelling. - Do not restart amlodipine  at this time. Follow-up in 6 weeks for monitoring of BP  Prediabetes Last HbA1c was 6.2%. No pharmacological treatment, focusing on lifestyle modification. - Monitor HbA1c periodically.  Hyperlipidemia - Continue rosuvastatin  40 mg daily. -Continue asa 81mg /day  Chronic obstructive pulmonary disease (COPD) in a current smoker Current smoker on Symbicort . Previously counseled to quit smoking. - Continue Symbicort  160/4.5 mcg, two puffs twice daily. - Reinforce smoking cessation counseling.  Chronic pain in left upper extremity (shoulder, elbow, wrist) Chronic pain with numbness in fingers, improved with heat. Plans to see orthopedist. - Offered to order X-ray of left hand, wrist, and elbow. Patient declines and would like to wait to see ortho -  Encourage use of heat application for pain relief.  History of multiple falls Most recent fall eight months ago. Uses a cane or walker due to legs and back giving out. - Encourage use of cane or walker, especially for longer distances.  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 Receiving injections at [facility]. - Continue follow-up with [facility] for pain management.  Schizophrenia Long-standing schizophrenia managed with medication. - Continue current schizophrenia medication regimen.   I personally spent a total of 48 minutes in the care of the patient today including preparing to see the patient, getting/reviewing separately obtained history, performing a medically appropriate exam/evaluation, counseling and educating, placing orders, referring and communicating with other health care professionals, and documenting clinical information in the EHR.  Return in about 6 weeks (around 03/02/2024) for F/U with Lauraine - needs 40 minute visit.    Lauraine FORBES Pereyra, NP

## 2024-01-20 NOTE — Assessment & Plan Note (Signed)
 Coronary artery disease with non-obstructive stenosis Recent cardiac catheterization showed 30% stenosis in proximal RCA and 40% in proximal to mid LAD, indicating non-obstructive disease. - Continue aspirin  81 mg daily. - Continue rosuvastatin  40 mg daily. -Follow-up with cardiology

## 2024-01-21 ENCOUNTER — Ambulatory Visit: Payer: Self-pay | Admitting: Nurse Practitioner

## 2024-01-24 DIAGNOSIS — M5416 Radiculopathy, lumbar region: Secondary | ICD-10-CM | POA: Diagnosis not present

## 2024-01-24 DIAGNOSIS — M48062 Spinal stenosis, lumbar region with neurogenic claudication: Secondary | ICD-10-CM | POA: Diagnosis not present

## 2024-01-24 DIAGNOSIS — M5412 Radiculopathy, cervical region: Secondary | ICD-10-CM | POA: Diagnosis not present

## 2024-01-24 DIAGNOSIS — M7542 Impingement syndrome of left shoulder: Secondary | ICD-10-CM | POA: Diagnosis not present

## 2024-02-01 ENCOUNTER — Other Ambulatory Visit: Payer: Self-pay | Admitting: Psychiatry

## 2024-02-01 DIAGNOSIS — F418 Other specified anxiety disorders: Secondary | ICD-10-CM

## 2024-02-06 ENCOUNTER — Telehealth: Payer: Self-pay | Admitting: Nurse Practitioner

## 2024-02-06 DIAGNOSIS — E785 Hyperlipidemia, unspecified: Secondary | ICD-10-CM

## 2024-02-06 DIAGNOSIS — R7303 Prediabetes: Secondary | ICD-10-CM

## 2024-02-06 NOTE — Telephone Encounter (Signed)
 Copied from CRM #[id number]. Topic: Clinical - Medication Question >> Feb 06, 2024  4:20 PM Drema MATSU wrote: Reason for CRM: Patient wants to know if a prescription can be called in for a glucose monitor to check blood sugars. Please call patient wife.

## 2024-02-09 DIAGNOSIS — M5416 Radiculopathy, lumbar region: Secondary | ICD-10-CM | POA: Diagnosis not present

## 2024-02-15 MED ORDER — FREESTYLE LIBRE 3 READER DEVI: 0 refills | Status: AC

## 2024-02-15 MED ORDER — FREESTYLE LIBRE 3 PLUS SENSOR MISC: 4 refills | Status: AC

## 2024-02-15 NOTE — Telephone Encounter (Signed)
 Called pt and made him aware that order will be placed today but due to him not being diabetic, it could be denied

## 2024-02-15 NOTE — Addendum Note (Signed)
 Addended by: LEAR, Jsean Taussig P on: 02/15/2024 04:53 PM   Modules accepted: Orders

## 2024-02-17 ENCOUNTER — Other Ambulatory Visit (HOSPITAL_COMMUNITY): Payer: Self-pay

## 2024-02-17 ENCOUNTER — Telehealth: Payer: Self-pay

## 2024-02-17 NOTE — Telephone Encounter (Signed)
 Pharmacy Patient Advocate Encounter   Received notification from Onbase that prior authorization for FreeStyle [place] 3 Reader device  is required/requested.   Insurance verification completed.   The patient is insured through [employer].   Per test claim: PA required; PA submitted to above mentioned insurance via Latent Key/confirmation #/EOC [id number] Status is pending

## 2024-02-22 NOTE — Telephone Encounter (Signed)
 Pharmacy Patient Advocate Encounter  Received notification from [facility] that Prior Authorization for FreeStyle [place] 3 Reader device  has been DENIED.  See denial reason below. No denial letter attached in CMM. Will attach denial letter to Media tab once received.   PA #/Case ID/Reference #: [id number]

## 2024-02-23 ENCOUNTER — Other Ambulatory Visit: Payer: Self-pay | Admitting: Psychiatry

## 2024-02-23 DIAGNOSIS — F203 Undifferentiated schizophrenia: Secondary | ICD-10-CM

## 2024-03-07 ENCOUNTER — Ambulatory Visit: Payer: Self-pay

## 2024-03-07 NOTE — Telephone Encounter (Signed)
 Patient called, left VM to return the call to the office to speak to NT.     Copied from CRM #[id number]. Topic: Clinical - Red Word Triage >> Mar 07, 2024  3:48 PM Suzen RAMAN wrote: Red Word that prompted transfer to Nurse Triage: arm pain(previous injury to elbow from falling on a glass table) requesting an appt to be referred for x-ray.  patient wife just called in with a RED Word but unable to hold for NT because she was pulling into work so she requesting NT give patient a call directly.     [phone] (M)

## 2024-03-08 ENCOUNTER — Ambulatory Visit: Admitting: Nurse Practitioner

## 2024-03-10 ENCOUNTER — Encounter (HOSPITAL_BASED_OUTPATIENT_CLINIC_OR_DEPARTMENT_OTHER): Payer: Self-pay | Admitting: Emergency Medicine

## 2024-03-10 ENCOUNTER — Emergency Department (HOSPITAL_BASED_OUTPATIENT_CLINIC_OR_DEPARTMENT_OTHER)

## 2024-03-10 ENCOUNTER — Emergency Department (HOSPITAL_BASED_OUTPATIENT_CLINIC_OR_DEPARTMENT_OTHER)
Admission: EM | Admit: 2024-03-10 | Discharge: 2024-03-10 | Disposition: A | Discharge status: Home / Self Care | Attending: Emergency Medicine | Admitting: Emergency Medicine

## 2024-03-10 ENCOUNTER — Other Ambulatory Visit: Payer: Self-pay

## 2024-03-10 DIAGNOSIS — Z8546 Personal history of malignant neoplasm of prostate: Secondary | ICD-10-CM | POA: Diagnosis not present

## 2024-03-10 DIAGNOSIS — G8929 Other chronic pain: Secondary | ICD-10-CM | POA: Diagnosis not present

## 2024-03-10 DIAGNOSIS — J449 Chronic obstructive pulmonary disease, unspecified: Secondary | ICD-10-CM | POA: Insufficient documentation

## 2024-03-10 DIAGNOSIS — M79602 Pain in left arm: Secondary | ICD-10-CM | POA: Diagnosis present

## 2024-03-10 DIAGNOSIS — F17200 Nicotine dependence, unspecified, uncomplicated: Secondary | ICD-10-CM | POA: Insufficient documentation

## 2024-03-10 DIAGNOSIS — N183 Chronic kidney disease, stage 3 unspecified: Secondary | ICD-10-CM | POA: Insufficient documentation

## 2024-03-10 DIAGNOSIS — M25522 Pain in left elbow: Secondary | ICD-10-CM | POA: Insufficient documentation

## 2024-03-10 DIAGNOSIS — I129 Hypertensive chronic kidney disease with stage 1 through stage 4 chronic kidney disease, or unspecified chronic kidney disease: Secondary | ICD-10-CM | POA: Diagnosis not present

## 2024-03-10 MED ORDER — OXYCODONE-ACETAMINOPHEN 5-325 MG PO TABS
1.0000 | ORAL_TABLET | Freq: Four times a day (QID) | ORAL | 0 refills | Status: AC | PRN
Start: 2024-03-10 — End: ?

## 2024-03-10 MED ORDER — OXYCODONE-ACETAMINOPHEN 5-325 MG PO TABS
1.0000 | ORAL_TABLET | Freq: Once | ORAL | Status: AC
  Administered 2024-03-10: 1 via ORAL
  Filled 2024-03-10: qty 1

## 2024-03-10 NOTE — ED Triage Notes (Signed)
  Patient comes in with L arm pain that has been going on for about a week.  Patient states he thinks he was bitten by a bug and has had numbness and tingling from elbow down to 5th finger ever since.  Denies any fevers.  No abdominal pain.  Has been taking gabapentin  and motrin  with little relief.  Hx pinched nerve in neck.  Pain 10/10, throbbing.

## 2024-03-10 NOTE — ED Provider Notes (Signed)
 [place] EMERGENCY DEPARTMENT AT [facility] Provider Note   CSN: [id number] Arrival date & time: 03/10/24  9394     Patient presents with: Arm Pain   [name] is a 68 y.o. male.  {Add pertinent medical, surgical, social history, OB history to HPI:32947} HPI     This is a 68 year old male who presents with left arm pain.  Patient reports 1 month history of worsening left arm pain.  Reports pain in the left elbow and arm.  He reports tingling in the left fifth digit which is not new.  Family at bedside states that he had a fall and hit that left elbow sometime ago.  He also has a history of prior surgery on that elbow.  He states he has been taking gabapentin  and NSAIDs with no relief.  No fevers or joint swelling noted.  No history of gout.  Patient states he thinks he may have been bitten by something on his left hand.  Prior to Admission medications   Medication Sig Start Date End Date Taking? Authorizing Provider  acetaminophen  (TYLENOL ) 500 MG tablet Take 1,000 mg by mouth every 6 (six) hours as needed for moderate pain.     [provider]  albuterol  (PROVENTIL ) (2.5 MG/3ML) 0.083% nebulizer solution INHALE 3 ML BY NEBULIZATION EVERY 6 HOURS AS NEEDED FOR WHEEZING OR SHORTNESS OF BREATH 07/27/22   Jason Leita Repine, FNP  albuterol  (VENTOLIN  HFA) [phone] Base) MCG/ACT inhaler TAKE 2 PUFFS BY MOUTH EVERY 6 HOURS AS NEEDED FOR WHEEZE OR SHORTNESS OF BREATH 02/05/23   Kassie Acquanetta Bradley, MD  aspirin  EC 81 MG tablet Take 1 tablet (81 mg total) by mouth daily. Swallow whole. 01/15/24   Dahal, Chapman, MD  benztropine  (COGENTIN ) 0.5 MG tablet TAKE 1 TABLET BY MOUTH TWICE A DAY AS NEEDED FOR SIDE EFFECTS OF PROLIXIN  02/24/24   Eappen, Saramma, MD  carvedilol  (COREG ) 12.5 MG tablet Take 1 tablet (12.5 mg total) by mouth 2 (two) times daily with a meal. 01/14/24   Dahal, Chapman, MD  celecoxib  (CELEBREX ) 50 MG capsule TAKE 1 CAPSULE (50 MG TOTAL) BY MOUTH 2 (TWO)  TIMES DAILY AS NEEDED FOR PAIN. 11/25/22   Onita Duos, MD  cetirizine  (ZYRTEC ) 10 MG tablet Take 1 tablet (10 mg total) by mouth daily. Patient taking differently: Take 10 mg by mouth every evening. 06/27/23   Okey Burns, MD  Continuous Glucose Receiver (FREESTYLE [place] 3 READER) DEVI Use it to check sugar daily 02/15/24   Elnor Lauraine BRAVO, NP  Continuous Glucose Sensor (FREESTYLE LIBRE 3 PLUS SENSOR) MISC Change sensor every 15 days. 02/15/24   Elnor Lauraine BRAVO, NP  Cyanocobalamin  (VITAMIN B-12 PO) Take 1 tablet by mouth daily.    [provider]  cyclobenzaprine  (FLEXERIL ) 10 MG tablet TAKE 1 TABLET BY MOUTH EVERYDAY AT BEDTIME 12/12/23   Jason Leita Repine, FNP  diclofenac  Sodium (VOLTAREN ) 1 % GEL Apply 2 g topically 4 (four) times daily as needed (pain). 02/08/23   Jason Leita Repine, FNP  EPINEPHrine  0.3 mg/0.3 mL IJ SOAJ injection Inject 0.3 mg into the muscle as needed for anaphylaxis. 05/28/23   Theadore Ozell HERO, MD  esomeprazole  (NEXIUM ) 40 MG capsule TAKE 1 CAPSULE BY MOUTH EVERY DAY 11/11/23   Jason Leita Repine, FNP  fluPHENAZine  (PROLIXIN ) 10 MG tablet TAKE 1 TABLET BY MOUTH EVERY DAY Patient taking differently: Take 10 mg by mouth every evening. 11/11/23   Eappen, Saramma, MD  fluticasone  (FLONASE ) 50 MCG/ACT nasal spray Place  2 sprays into both nostrils daily. 06/27/23   Soldatova, Liuba, MD  furosemide  (LASIX ) 20 MG tablet Take 1 tablet (20 mg total) by mouth daily as needed for fluid or edema. Take 1 tablet daily as directed; may take 2nd pill in as needed for edema 01/14/24   Arlice Reichert, MD  gabapentin  (NEURONTIN ) 300 MG capsule TAKE 1 CAPSULE BY MOUTH TWICE A DAY 01/04/24   Jason Leita Repine, FNP  hydrOXYzine  (VISTARIL ) 25 MG capsule TAKE 1 CAPSULE (25 MG TOTAL) BY MOUTH EVERY 8 (EIGHT) HOURS AS NEEDED FOR ANXIETY. 02/01/24   Eappen, Saramma, MD  potassium chloride  SA (KLOR-CON  M) 20 MEQ tablet Take 1 tablet (20 mEq total) by mouth daily as needed. On the day  Lasix  is taken 01/14/24   Arlice Reichert, MD  rosuvastatin  (CRESTOR ) 40 MG tablet Take 1 tablet (40 mg total) by mouth daily. 01/15/24   Arlice Reichert, MD  sildenafil  (VIAGRA ) 100 MG tablet Take 100 mg by mouth daily as needed for erectile dysfunction. 10/28/20   [provider]  SYMBICORT  160-4.5 MCG/ACT inhaler INHALE 2 PUFFS INTO THE LUNGS IN THE MORNING AND 2 PUFFS AT BEDTIME 12/15/22   Perri Ronal PARAS, MD  tamsulosin  (FLOMAX ) 0.4 MG CAPS capsule Take 0.8 mg by mouth daily. 06/09/23   [provider]  traMADol  (ULTRAM ) 50 MG tablet Take 1 tablet (50 mg total) by mouth every 6 (six) hours as needed. 12/31/22   Steinl, Kevin, MD  traZODone  (DESYREL ) 100 MG tablet TAKE 1/2 TO 1 TABLET (50-100 MG TOTAL) BY MOUTH AT BEDTIME AS NEEDED FOR SLEEP 12/22/23   Eappen, Saramma, MD  VITAMIN D  PO Take 1 tablet by mouth daily.    [provider]    Allergies: Iohexol , Ivp dye [iodinated contrast media], Metoclopramide hcl, Ritalin [methylphenidate], and Wellbutrin [bupropion]    Review of Systems  Constitutional:  Negative for fever.  Respiratory:  Negative for shortness of breath.   Cardiovascular:  Negative for chest pain.  Musculoskeletal:  Negative for joint swelling.       Arm pain  All other systems reviewed and are negative.   Updated Vital Signs BP (!) 161/92 (BP Location: Right Arm)   Pulse 99   Temp 98 F (36.7 C) (Oral)   Resp 20   SpO2 93%   Physical Exam Vitals and nursing note reviewed.  Constitutional:      Appearance: He is well-developed. He is obese. He is not ill-appearing.  HENT:     Head: Normocephalic and atraumatic.  Eyes:     Pupils: Pupils are equal, round, and reactive to light.  Cardiovascular:     Rate and Rhythm: Normal rate and regular rhythm.  Pulmonary:     Effort: Pulmonary effort is normal. No respiratory distress.  Abdominal:     Palpations: Abdomen is soft.     Tenderness: There is no abdominal tenderness.  Musculoskeletal:      Cervical back: Neck supple.     Comments: Normal range of motion left elbow, no obvious effusion, no erythema, scarring noted at the elbow  Lymphadenopathy:     Cervical: No cervical adenopathy.  Skin:    General: Skin is warm and dry.     Comments: No significant erythema of the skin  Neurological:     Mental Status: He is alert and oriented to person, place, and time.     (all labs ordered are listed, but only abnormal results are displayed) Labs Reviewed - No data to display  EKG: None  Radiology: No results found.  {Document cardiac monitor, telemetry assessment procedure when appropriate:32947} Procedures   Medications Ordered in the ED  oxyCODONE -acetaminophen  (PERCOCET/ROXICET) 5-325 MG per tablet 1 tablet (1 tablet Oral Given 03/10/24 0631)      {Click here for ABCD2, HEART and other calculators REFRESH Note before signing:1}                              Medical Decision Making Amount and/or Complexity of Data Reviewed Radiology: ordered.  Risk Prescription drug management.   ***  {Document critical care time when appropriate  Document review of labs and clinical decision tools ie CHADS2VASC2, etc  Document your independent review of radiology images and any outside records  Document your discussion with family members, caretakers and with consultants  Document social determinants of health affecting pt's care  Document your decision making why or why not admission, treatments were needed:32947:::1}   Final diagnoses:  None    ED Discharge Orders     None

## 2024-03-10 NOTE — ED Provider Notes (Signed)
 Patient received in signout.  Complaining of radiculopathy in the left hand.  Plain films negative.  Discussed with the patient, he is going to follow-up with his primary care doctor.   Mannie Pac T, DO 03/10/24 [phone]

## 2024-03-10 NOTE — Discharge Instructions (Signed)
 You were seen today for left arm pain.  Symptoms information of the nerve at the elbow which does cause pain and numbness.  Take medications as prescribed.  Follow-up with your orthopedist.

## 2024-03-31 ENCOUNTER — Encounter: Payer: Self-pay | Admitting: *Deleted

## 2024-03-31 ENCOUNTER — Ambulatory Visit: Admission: EM | Admit: 2024-03-31 | Discharge: 2024-03-31 | Disposition: A | Discharge status: Home / Self Care

## 2024-03-31 DIAGNOSIS — M5410 Radiculopathy, site unspecified: Secondary | ICD-10-CM

## 2024-03-31 MED ORDER — DEXAMETHASONE SOD PHOSPHATE PF 10 MG/ML IJ SOLN
10.0000 mg | Freq: Once | INTRAMUSCULAR | Status: AC
  Administered 2024-03-31: 10 mg via INTRAMUSCULAR

## 2024-03-31 MED ORDER — METHYLPREDNISOLONE SODIUM SUCC 125 MG IJ SOLR: 60.0000 mg | Freq: Once | INTRAMUSCULAR | Status: DC

## 2024-03-31 MED ORDER — KETOROLAC TROMETHAMINE 15 MG/ML IJ SOLN: 30.0000 mg | Freq: Once | INTRAMUSCULAR | Status: DC

## 2024-03-31 MED ORDER — KETOROLAC TROMETHAMINE 15 MG/ML IJ SOLN
30.0000 mg | Freq: Once | INTRAMUSCULAR | Status: AC
  Administered 2024-03-31: 30 mg via INTRAMUSCULAR

## 2024-03-31 NOTE — ED Provider Notes (Addendum)
 [facility]    CSN: [id number] Arrival date & time: 03/31/24  1023      History   Chief Complaint Chief Complaint  Patient presents with   Arm Pain    HPI [name] is a 68 y.o. male.   Pt presents today due to 10/10 pain and parasthesia of left arm for the past month. Pt is seen by ortho and states that he was told that his symptoms are caused by a pinched nerve in his neck. Pt has been prescribed gabapentin  which is occasionally helpful and oxycodone  which is ineffective. Pt has received epidurals from his ortho with +/- relief of pain. Pt is requesting a shot in office today. Pt denies that he has diabetes.   The history is provided by the patient.  Arm Pain    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   LOW BACK PAIN, CHRONIC 11/08/2007   Pancreatitis    Prostate cancer (HCC)    Rectal abscess    SCHIZOPHRENIA NEC, CHRONIC 01/25/2007   SCOLIOSIS NEC 01/25/2007   Sebaceous cyst 08/10/2010   SMOKER 08/05/2009   UPPER GASTROINTESTINAL HEMORRHAGE 01/25/2007    Patient Active Problem List   Diagnosis Date Noted   Falls 01/20/2024   Unstable angina (HCC) 01/13/2024   Prediabetes 01/13/2024   Spinal stenosis of lumbar region 06/08/2023   Insomnia 12/14/2022   Carpal tunnel syndrome of right wrist 10/29/2022   Ulnar neuropathy of right upper extremity  10/22/2022   Paresthesia 08/26/2021   Ulnar neuropathy at elbow, left 08/26/2021   Gait abnormality 07/14/2021   Chronic bilateral low back pain with bilateral sciatica 07/14/2021   Neck pain 07/14/2021   Left hand weakness 07/14/2021   Trigger finger, right middle finger 06/02/2021   Coronary artery calcification 08/15/2020   Nodule of apex of right lung 05/29/2020   Malignant neoplasm of prostate (HCC) 10/26/2019   Body mass index (BMI) 25.0-25.9, adult 10/25/2019   Spondylosis of cervical region without myelopathy or radiculopathy 09/26/2019   Numbness of hand 06/22/2019   Ulnar neuropathy at elbow of left upper extremity 06/05/2019   Chronic obstructive pulmonary disease (HCC) 10/24/2018   Cervical radiculopathy at C8 01/18/2018   Right lateral epicondylitis 12/21/2017   Pain in joint, shoulder region 04/23/2015   Periumbilical abdominal pain 02/13/2015   Elevated PSA 08/04/2014   Subacromial bursitis 04/11/2014   Neck pain on right side 03/05/2014   Bilateral shoulder pain 03/05/2014   Recurrent boils 03/05/2014   Foreign body in stomach 12/31/2013   Reflux esophagitis 12/31/2013   Weight loss 05/06/2012   Incisional hernia 11/23/2011   Cramp of limb 01/13/2011   Hyperthyroidism 08/11/2010   Sebaceous cyst 08/10/2010   HYPERTROPHY PROSTATE W/UR OBST & OTH LUTS 02/27/2010   Tobacco use disorder 08/05/2009   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  Lily   COLONOSCOPY  04/23/2010   normal   COLONOSCOPY WITH PROPOFOL   01/22/2021   poor prep[   ESOPHAGOGASTRODUODENOSCOPY N/A 12/31/2013   Procedure: ESOPHAGOGASTRODUODENOSCOPY (EGD);  Surgeon: Lupita FORBES Commander, MD;  Location: [facility];  Service: Endoscopy;  Laterality: N/A;   Excision of scalp lesion  07/18/2009   Excision of thyroid  mass     GANGLION CYST EXCISION     right foot x 2   HERNIA REPAIR     ventral   INCISIONAL HERNIA REPAIR  12/01/2011   Procedure: LAPAROSCOPIC INCISIONAL HERNIA;  Surgeon: Vicenta DELENA Poli, MD;  Location: [facility];  Service: General;  Laterality: N/A;   LEFT HEART CATH AND CORONARY ANGIOGRAPHY N/A 01/13/2024   Procedure: LEFT HEART CATH AND CORONARY ANGIOGRAPHY;  Surgeon: Anner Alm ORN, MD;  Location: [facility] INVASIVE CV LAB;  Service: Cardiovascular;  Laterality: N/A;   SHOULDER SURGERY     right   UPPER GASTROINTESTINAL ENDOSCOPY         Home Medications    Prior to Admission medications  Medication Sig Start Date End Date Taking? Authorizing Provider  albuterol  (VENTOLIN  HFA) 108 (90 Base) MCG/ACT inhaler TAKE 2 PUFFS BY MOUTH EVERY 6 HOURS AS NEEDED FOR WHEEZE OR SHORTNESS OF BREATH 02/05/23  Yes Kassie Acquanetta Bradley, MD  amLODipine  (NORVASC ) 10 MG tablet Take 10 mg by mouth daily. 02/02/24  Yes [provider]  aspirin  EC 81 MG tablet Take 1 tablet (81 mg total) by mouth daily. Swallow whole. 01/15/24  Yes Dahal, Chapman, MD  benztropine  (COGENTIN ) 0.5 MG tablet TAKE 1 TABLET BY MOUTH TWICE A DAY AS NEEDED FOR SIDE EFFECTS OF PROLIXIN  02/24/24  Yes Eappen, Saramma, MD  carvedilol  (COREG ) 12.5 MG tablet Take 1 tablet (12.5 mg total) by mouth 2 (two) times daily with a meal. 01/14/24  Yes Dahal, Chapman, MD  celecoxib  (CELEBREX ) 50 MG capsule TAKE 1 CAPSULE (50 MG TOTAL) BY MOUTH 2 (TWO) TIMES DAILY AS NEEDED FOR PAIN. 11/25/22  Yes Onita Duos, MD  cetirizine  (ZYRTEC ) 10 MG tablet Take 1 tablet (10 mg total) by mouth daily. Patient taking  differently: Take 10 mg by mouth every evening. 06/27/23  Yes Soldatova, Liuba, MD  Cyanocobalamin  (VITAMIN B-12 PO) Take 1 tablet by mouth daily.   Yes [provider]  esomeprazole  (NEXIUM ) 40 MG capsule TAKE 1 CAPSULE BY MOUTH EVERY DAY 11/11/23  Yes Jason Leita Repine, FNP  fluPHENAZine  (PROLIXIN ) 10 MG tablet TAKE 1 TABLET BY MOUTH EVERY DAY Patient taking differently: Take 10 mg by mouth every evening. 11/11/23  Yes Eappen, Saramma, MD  furosemide  (LASIX ) 20 MG tablet Take 1 tablet (20 mg total) by mouth daily as needed for fluid or edema. Take 1 tablet daily as directed; may take 2nd pill in as needed for edema 01/14/24  Yes Dahal, Chapman, MD  gabapentin  (NEURONTIN ) 300 MG capsule TAKE 1 CAPSULE BY MOUTH TWICE A DAY 01/04/24  Yes Jason Leita Repine, FNP  potassium chloride  SA (KLOR-CON  M) 20 MEQ tablet Take 1  tablet (20 mEq total) by mouth daily as needed. On the day Lasix  is taken 01/14/24  Yes Dahal, Chapman, MD  rosuvastatin  (CRESTOR ) 40 MG tablet Take 1 tablet (40 mg total) by mouth daily. 01/15/24  Yes Dahal, Chapman, MD  tamsulosin  (FLOMAX ) 0.4 MG CAPS capsule Take 0.8 mg by mouth daily. 06/09/23  Yes [provider]  traZODone  (DESYREL ) 100 MG tablet TAKE 1/2 TO 1 TABLET (50-100 MG TOTAL) BY MOUTH AT BEDTIME AS NEEDED FOR SLEEP 12/22/23  Yes Eappen, Saramma, MD  valsartan  (DIOVAN ) 320 MG tablet Take 320 mg by mouth daily. 02/23/24  Yes [provider]  VITAMIN D  PO Take 1 tablet by mouth daily.   Yes [provider]  acetaminophen  (TYLENOL ) 500 MG tablet Take 1,000 mg by mouth every 6 (six) hours as needed for moderate pain.     [provider]  albuterol  (PROVENTIL ) (2.5 MG/3ML) 0.083% nebulizer solution INHALE 3 ML BY NEBULIZATION EVERY 6 HOURS AS NEEDED FOR WHEEZING OR SHORTNESS OF BREATH 07/27/22   Jason Leita Repine, FNP  Continuous Glucose Receiver (FREESTYLE LIBRE 3 READER) DEVI Use it to check sugar daily 02/15/24   Elnor Lauraine BRAVO, NP   Continuous Glucose Sensor (FREESTYLE LIBRE 3 PLUS SENSOR) MISC Change sensor every 15 days. 02/15/24   Elnor Lauraine BRAVO, NP  cyclobenzaprine  (FLEXERIL ) 10 MG tablet TAKE 1 TABLET BY MOUTH EVERYDAY AT BEDTIME 12/12/23   Jason Leita Repine, FNP  diclofenac  Sodium (VOLTAREN ) 1 % GEL Apply 2 g topically 4 (four) times daily as needed (pain). 02/08/23   Jason Leita Repine, FNP  EPINEPHrine  0.3 mg/0.3 mL IJ SOAJ injection Inject 0.3 mg into the muscle as needed for anaphylaxis. 05/28/23   Theadore Ozell HERO, MD  fluticasone  (FLONASE ) 50 MCG/ACT nasal spray Place 2 sprays into both nostrils daily. 06/27/23   Soldatova, Liuba, MD  hydrOXYzine  (VISTARIL ) 25 MG capsule TAKE 1 CAPSULE (25 MG TOTAL) BY MOUTH EVERY 8 (EIGHT) HOURS AS NEEDED FOR ANXIETY. 02/01/24   Eappen, Saramma, MD  oxyCODONE -acetaminophen  (PERCOCET/ROXICET) 5-325 MG tablet Take 1 tablet by mouth every 6 (six) hours as needed. 03/10/24   Horton, Charmaine FALCON, MD  sildenafil  (VIAGRA ) 100 MG tablet Take 100 mg by mouth daily as needed for erectile dysfunction. 10/28/20   [provider]  SYMBICORT  160-4.5 MCG/ACT inhaler INHALE 2 PUFFS INTO THE LUNGS IN THE MORNING AND 2 PUFFS AT BEDTIME 12/15/22   Perri Ronal PARAS, MD  traMADol  (ULTRAM ) 50 MG tablet Take 1 tablet (50 mg total) by mouth every 6 (six) hours as needed. 12/31/22   Bernard Drivers, MD    Family History Family History  Problem Relation Age of Onset   Hypertension Mother    Bone cancer Mother    Hypertension Father    Kidney disease Father    Prostate cancer Brother    Bone cancer Brother    Kidney disease Brother    Mental illness Maternal Grandmother    Mental illness Son    Prostate cancer Other    Colon cancer Neg Hx    Colon polyps Neg Hx    Esophageal cancer Neg Hx    Rectal cancer Neg Hx    Stomach cancer Neg Hx     Social History Social History[1]   Allergies   Iohexol , Ivp dye [iodinated contrast media], Metoclopramide hcl, Ritalin [methylphenidate], and  Wellbutrin [bupropion]   Review of Systems Review of Systems   Physical Exam Triage Vital Signs ED Triage Vitals  Encounter Vitals Group  BP 03/31/24 1113 (!) 146/92     Girls Systolic BP Percentile --      Girls Diastolic BP Percentile --      Boys Systolic BP Percentile --      Boys Diastolic BP Percentile --      Pulse Rate 03/31/24 1113 94     Resp 03/31/24 1113 16     Temp 03/31/24 1113 98 F (36.7 C)     Temp Source 03/31/24 1113 Oral     SpO2 03/31/24 1113 94 %     Weight --      Height --      Head Circumference --      Peak Flow --      Pain Score 03/31/24 1108 10     Pain Loc --      Pain Education --      Exclude from Growth Chart --    No data found.  Updated Vital Signs BP (!) 146/92 (BP Location: Left Arm)   Pulse 94   Temp 98 F (36.7 C) (Oral)   Resp 16   SpO2 94%   Visual Acuity Right Eye Distance:   Left Eye Distance:   Bilateral Distance:    Right Eye Near:   Left Eye Near:    Bilateral Near:     Physical Exam Vitals and nursing note reviewed.  Constitutional:      General: He is not in acute distress.    Appearance: Normal appearance. He is not ill-appearing, toxic-appearing or diaphoretic.  Eyes:     General: No scleral icterus. Cardiovascular:     Rate and Rhythm: Normal rate and regular rhythm.     Heart sounds: Normal heart sounds.  Pulmonary:     Effort: Pulmonary effort is normal. No respiratory distress.     Breath sounds: Normal breath sounds. No wheezing or rhonchi.  Skin:    General: Skin is warm.  Neurological:     Mental Status: He is alert and oriented to person, place, and time.  Psychiatric:        Mood and Affect: Mood normal.        Behavior: Behavior normal.      UC Treatments / Results  Labs (all labs ordered are listed, but only abnormal results are displayed) Labs Reviewed - No data to display  EKG   Radiology No results found.  Procedures Procedures (including critical care  time)  Medications Ordered in UC Medications  dexamethasone  (DECADRON ) injection 10 mg (10 mg Intramuscular Given 03/31/24 1159)  ketorolac  (TORADOL ) 15 MG/ML injection 30 mg (30 mg Intramuscular Given 03/31/24 1159)    Initial Impression / Assessment and Plan / UC Course  I have reviewed the triage vital signs and the nursing notes.  Pertinent labs & imaging results that were available during my care of the patient were reviewed by me and considered in my medical decision making (see chart for details).    Pt experienced pain relief with in office medications. Final Clinical Impressions(s) / UC Diagnoses   Final diagnoses:  Radiculopathy of arm   Discharge Instructions   None    ED Prescriptions   None    PDMP not reviewed this encounter.    Andra Corean BROCKS, PA-C 03/31/24 1229     [1]  Social History Tobacco Use   Smoking status: Every Day    Current packs/day: 1.50    Average packs/day: 1.5 packs/day for 54.9 years (82.4 ttl pk-yrs)    Types: Cigarettes  Start date: 37    Passive exposure: Never   Smokeless tobacco: Never   Tobacco comments:    Started smoking at age 26,still smoking pack - 1-1/2 packs  per day 01/08/2021  Vaping Use   Vaping status: Never Used  Substance Use Topics   Alcohol use: Not Currently    Comment: rare   Drug use: Yes    Frequency: 1.0 times per week    Types: Marijuana    Comment: sometimes for pain     Andra Corean BROCKS, PA-C 03/31/24 1230

## 2024-03-31 NOTE — ED Triage Notes (Signed)
 Pt presents with c/o left arm pain and numbness for 1-2 weeks. States he was bitten on the hand by an unknown insect a month ago and that's when these symptoms started. States he has numbness from left elbow to hand. And the side of his hand is red. He is requesting x-rays and blood work to find what is wrong. He takes gabapentin  and motrin . He was seen in the ED for similar symptoms at the end of November. States I take about 20 pills a day but can't give names of all. States out of pain meds

## 2024-04-26 ENCOUNTER — Encounter: Payer: Self-pay | Admitting: Psychiatry

## 2024-04-26 ENCOUNTER — Telehealth: Payer: Self-pay | Admitting: Psychiatry

## 2024-04-26 ENCOUNTER — Ambulatory Visit: Admitting: Psychiatry

## 2024-04-26 DIAGNOSIS — F203 Undifferentiated schizophrenia: Secondary | ICD-10-CM

## 2024-04-26 DIAGNOSIS — F418 Other specified anxiety disorders: Secondary | ICD-10-CM

## 2024-04-26 MED ORDER — FLUPHENAZINE HCL 10 MG PO TABS: 10.0000 mg | ORAL_TABLET | Freq: Every day | ORAL | 3 refills | Status: AC

## 2024-04-26 NOTE — Telephone Encounter (Signed)
 I have sent refills as requested

## 2024-04-26 NOTE — Telephone Encounter (Signed)
 I have sent medication refills as requested to pharmacy.

## 2024-05-03 ENCOUNTER — Emergency Department

## 2024-05-03 ENCOUNTER — Ambulatory Visit: Admitting: Psychiatry

## 2024-05-03 ENCOUNTER — Other Ambulatory Visit: Payer: Self-pay

## 2024-05-03 ENCOUNTER — Emergency Department
Admission: EM | Admit: 2024-05-03 | Discharge: 2024-05-03 | Disposition: A | Discharge status: Home / Self Care | Attending: Emergency Medicine | Admitting: Emergency Medicine

## 2024-05-03 ENCOUNTER — Encounter: Payer: Self-pay | Admitting: Psychiatry

## 2024-05-03 VITALS — BP 154/92 | HR 82 | Temp 98.5°F | Ht 67.5 in | Wt 254.0 lb

## 2024-05-03 DIAGNOSIS — F209 Schizophrenia, unspecified: Secondary | ICD-10-CM

## 2024-05-03 DIAGNOSIS — G4701 Insomnia due to medical condition: Secondary | ICD-10-CM

## 2024-05-03 DIAGNOSIS — F418 Other specified anxiety disorders: Secondary | ICD-10-CM | POA: Diagnosis not present

## 2024-05-03 DIAGNOSIS — R079 Chest pain, unspecified: Secondary | ICD-10-CM

## 2024-05-03 DIAGNOSIS — I129 Hypertensive chronic kidney disease with stage 1 through stage 4 chronic kidney disease, or unspecified chronic kidney disease: Secondary | ICD-10-CM | POA: Insufficient documentation

## 2024-05-03 DIAGNOSIS — R0789 Other chest pain: Secondary | ICD-10-CM | POA: Diagnosis present

## 2024-05-03 DIAGNOSIS — N189 Chronic kidney disease, unspecified: Secondary | ICD-10-CM | POA: Insufficient documentation

## 2024-05-03 DIAGNOSIS — F17200 Nicotine dependence, unspecified, uncomplicated: Secondary | ICD-10-CM

## 2024-05-03 DIAGNOSIS — D72829 Elevated white blood cell count, unspecified: Secondary | ICD-10-CM | POA: Diagnosis not present

## 2024-05-03 DIAGNOSIS — J449 Chronic obstructive pulmonary disease, unspecified: Secondary | ICD-10-CM | POA: Insufficient documentation

## 2024-05-03 DIAGNOSIS — R42 Dizziness and giddiness: Secondary | ICD-10-CM | POA: Diagnosis not present

## 2024-05-03 LAB — BASIC METABOLIC PANEL WITH GFR
Anion gap: 9 (ref 5–15)
BUN: 8 mg/dL (ref 8–23)
CO2: 32 mmol/L (ref 22–32)
Calcium: 9.2 mg/dL (ref 8.9–10.3)
Chloride: 100 mmol/L (ref 98–111)
Creatinine, Ser: 1.15 mg/dL (ref 0.61–1.24)
GFR, Estimated: >60 mL/min
Glucose, Bld: 101 mg/dL — ABNORMAL HIGH (ref 70–99)
Potassium: 4.2 mmol/L (ref 3.5–5.1)
Sodium: 140 mmol/L (ref 135–145)

## 2024-05-03 LAB — CBC
HCT: 41.8 % (ref 39.0–52.0)
Hemoglobin: 13.8 g/dL (ref 13.0–17.0)
MCH: 28.2 pg (ref 26.0–34.0)
MCHC: 33 g/dL (ref 30.0–36.0)
MCV: 85.5 fL (ref 80.0–100.0)
Platelets: 263 K/uL (ref 150–400)
RBC: 4.89 MIL/uL (ref 4.22–5.81)
RDW: 15.9 % — ABNORMAL HIGH (ref 11.5–15.5)
WBC: 11 K/uL — ABNORMAL HIGH (ref 4.0–10.5)
nRBC: 0 % (ref 0.0–0.2)

## 2024-05-03 LAB — TROPONIN T, HIGH SENSITIVITY
Troponin T High Sensitivity: <15 ng/L (ref 0–19)
Troponin T High Sensitivity: <15 ng/L (ref 0–19)

## 2024-05-03 MED ORDER — IOHEXOL 350 MG/ML SOLN
100.0000 mL | Freq: Once | INTRAVENOUS | Status: AC | PRN
  Administered 2024-05-03: 100 mL via INTRAVENOUS

## 2024-05-03 MED ORDER — ASPIRIN 81 MG PO CHEW
324.0000 mg | CHEWABLE_TABLET | Freq: Once | ORAL | Status: AC
  Administered 2024-05-03: 324 mg via ORAL
  Filled 2024-05-03: qty 4

## 2024-05-03 MED ORDER — LACTATED RINGERS IV BOLUS
1000.0000 mL | Freq: Once | INTRAVENOUS | Status: AC
  Administered 2024-05-03: 1000 mL via INTRAVENOUS

## 2024-05-03 NOTE — ED Notes (Signed)
 Initial Patient contact @ this time; Patient found in left lateral position on hallway floor @ entrance to D-Pod after hearing ER Tech calling for assistance; Patient A/O x 3; skin warm/dry; resps spontaneous/unlabored; stating the pain in his chest and neck became so bad when he was walking to the treatment area that he just couldn't walk any more so he said he had to bring himself to the ground; Patient assisted by Staff to nearby stretcher w/o difficulty; Dr Willo @ bedside immediately upon call for help.  Patient in NAD; offers no C/O distress or injury; no obvious injury/trauma noted; neuro/vasc intact with no neuro deficits noted.

## 2024-05-03 NOTE — ED Triage Notes (Signed)
 Pt to ED via POV from home. Pt reports centralized CP that has been ongoing since October. Pt reports believes it may be from neck pain and degenerative disc disease. Pt reports pain radiates to left arm and pain is making it difficult to breath.

## 2024-05-03 NOTE — ED Notes (Signed)
 Pt up to the First Nurse Desk stating his CP is getting worse, repeat EKG obtained at this time.

## 2024-05-03 NOTE — ED Notes (Addendum)
 [name]

## 2024-05-03 NOTE — ED Provider Notes (Signed)
 "  [facility] Provider Note    Event Date/Time   First MD Initiated Contact with Patient 05/03/24 1803     (approximate)   History   Chief Complaint Chest Pain   HPI  [name] is a 69 y.o. male with past medical history of hypertension, hyperlipidemia, COPD, CKD, and schizophrenia who presents to the ED complaining of chest pain.  Patient reports sharp pain in the left side of his chest that radiates into his left arm that has been going on for multiple months, worse today.  He states that it is difficult to breathe when the pain is severe but he denies any associated fevers or cough.  While walking to his room, patient states that he became lightheaded and dizzy, fell to the ground at that time but did not lose consciousness.  He denies hitting his head and denies any pain from the fall.  He does report that he had similar pain last year, was evaluated with catheterization at that time and told he has a some plaque.     Physical Exam   Triage Vital Signs: ED Triage Vitals  Encounter Vitals Group     BP 05/03/24 1541 (!) 168/92     Girls Systolic BP Percentile --      Girls Diastolic BP Percentile --      Boys Systolic BP Percentile --      Boys Diastolic BP Percentile --      Pulse Rate 05/03/24 1538 82     Resp 05/03/24 1538 20     Temp 05/03/24 1538 98.6 F (37 C)     Temp Source 05/03/24 1538 Oral     SpO2 05/03/24 1538 97 %     Weight --      Height --      Head Circumference --      Peak Flow --      Pain Score 05/03/24 1539 10     Pain Loc --      Pain Education --      Exclude from Growth Chart --     Most recent vital signs: Vitals:   05/03/24 1804 05/03/24 2007  BP: (!) 189/91 (!) 163/78  Pulse: 74 74  Resp: 13 18  Temp:  98.1 F (36.7 C)  SpO2: 97% 96%    Constitutional: Alert and oriented. Eyes: Conjunctivae are normal. Head: Atraumatic. Nose: No congestion/rhinnorhea. Mouth/Throat: Mucous membranes are moist.   Cardiovascular: Normal rate, regular rhythm. Grossly normal heart sounds.  2+ radial pulses bilaterally. Respiratory: Normal respiratory effort.  No retractions. Lungs CTAB.  No chest wall tenderness to palpation noted. Gastrointestinal: Soft and nontender. No distention. Musculoskeletal: No lower extremity tenderness nor edema.  Neurologic:  Normal speech and language. No gross focal neurologic deficits are appreciated.    ED Results / Procedures / Treatments   Labs (all labs ordered are listed, but only abnormal results are displayed) Labs Reviewed  BASIC METABOLIC PANEL WITH GFR - Abnormal; Notable for the following components:      Result Value   Glucose, Bld 101 (*)    All other components within normal limits  CBC - Abnormal; Notable for the following components:   WBC 11.0 (*)    RDW 15.9 (*)    All other components within normal limits  TROPONIN T, HIGH SENSITIVITY  TROPONIN T, HIGH SENSITIVITY     EKG  ED ECG REPORT I, Carlin Palin, the attending physician, personally viewed and interpreted this ECG.  Date: 05/03/2024  EKG Time: 17:55  Rate: 73  Rhythm: normal sinus rhythm  Axis: Normal  Intervals:none  ST&T Change: None  ED ECG REPORT I, Carlin Palin, the attending physician, personally viewed and interpreted this ECG.   Date: 05/03/2024  EKG Time: 17:55  Rate: 73  Rhythm: normal sinus rhythm  Axis: Normal  Intervals:none  ST&T Change: None   RADIOLOGY Chest x-ray reviewed and interpreted by me with no infiltrate, edema, or effusion.  PROCEDURES:  Critical Care performed: No  Procedures   MEDICATIONS ORDERED IN ED: Medications  aspirin  chewable tablet 324 mg (324 mg Oral Given 05/03/24 1845)  lactated ringers  bolus 1,000 mL (1,000 mLs Intravenous New Bag/Given 05/03/24 1846)  iohexol  (OMNIPAQUE ) 350 MG/ML injection 100 mL (100 mLs Intravenous Contrast Given 05/03/24 2023)     IMPRESSION / MDM / ASSESSMENT AND PLAN / ED COURSE  I  reviewed the triage vital signs and the nursing notes.                              69 y.o. male with past medical history of hypertension, hyperlipidemia, COPD, CKD, and schizophrenia who presents to the ED complaining of sharp pain in the left side of his chest rating down his left arm with associated difficulty breathing.  Patient's presentation is most consistent with acute presentation with potential threat to life or bodily function.  Differential diagnosis includes, but is not limited to, ACS, PE, pneumonia, pneumothorax, musculoskeletal pain, GERD, anxiety.  Patient nontoxic-appearing and in no acute distress, vital signs are remarkable for hypertension but otherwise reassuring.  EKG shows no evidence of arrhythmia or ischemia and initial troponin within normal limits.  I did review his catheterization from the end of last year, which shows mild nonobstructive disease.  Symptoms overall seem atypical for ACS and would consider radiculopathy given radiation of pain down his arm and longstanding issues with his cervical spine.  Chest x-ray is unremarkable and additional labs without significant anemia, leukocytosis, electrolyte abnormality, or AKI.  Patient did have lightheadedness and near syncopal episode while attempting to ambulate to his room, repeat EKG also unremarkable and will check CTA chest.  CTA chest is negative for acute finding, repeat troponin also within normal limits.  On reassessment, patient complaining of pain starting in his neck and radiating down his left arm, cervical radiculopathy appears to be a chronic issue for the patient and the likely source of his discomfort today.  Low suspicion for cardiac etiology for his near syncopal episode and patient appropriate for discharge home with outpatient cardiology and PCP follow-up.  He was counseled to return to the ED for new or worsening symptoms, patient agrees with plan.      FINAL CLINICAL IMPRESSION(S) / ED DIAGNOSES    Final diagnoses:  Nonspecific chest pain  Dizziness     Rx / DC Orders   ED Discharge Orders          Ordered    Ambulatory referral to Cardiology        05/03/24 2113             Note:  This document was prepared using Dragon voice recognition software and may include unintentional dictation errors.   Palin Carlin, MD 05/03/24 2114  "

## 2024-05-03 NOTE — ED Notes (Signed)
 This tech walking pt back to ed room41 from triage room 3 offered a wheelchair pt declined while walking past room 40hall pt fell to the ground still alert did not his head on anything called out for help doctors and nurses help put pt in stretcher got pt in room and put on monitor.

## 2024-05-03 NOTE — Progress Notes (Signed)
 BH MD OP Progress Note  05/03/2024 3:27 PM [name]  MRN:  [id number]  Chief Complaint:  Chief Complaint  Patient presents with   Medication Refill   Follow-up   Anxiety   Schizophrenia   Discussed the use of AI scribe software for clinical note transcription with the patient, who gave verbal consent to proceed.  History of Present Illness [name] is a 69 year old African-American male on disability, lives in [place] with his wife, has a history of schizophrenia, tobacco use disorder, essential hypertension, coronary artery disease, prostate cancer, chronic pain, history of multiple surgeries was evaluated in office today for a follow-up appointment.  Patient is accompanied by his wife.    Patient today appeared to be alert, oriented to person place time situation.  Patient reports he has been experiencing severe chest pain and rates his pain as a 10 out of 10.  He reports approximately 30 minutes before the encounter he took hydroxyzine  to keep him calm and that has helped to some extent although he continues to rate his pain very high.  Earlier today he called EMS for chest pain and they came and did an evaluation and advised him to go to the emergency department if his pain does not get better.  Patient currently anxious about his pain otherwise denies any significant depression, psychosis, sleep problems.  He is compliant on his medications including fluphenazine , trazodone  and denies side effects.  Collateral information obtained from wife who agrees to take him to the nearest emergency department as soon as possible.    Visit Diagnosis:    ICD-10-CM   1. Schizophrenia, chronic condition (HCC)  F20.9     2. Other specified anxiety disorders  F41.8    Generalized anxiety not occurring more days than not    3. Insomnia due to medical condition  G47.01    pain, mood    4. Tobacco use disorder  F17.200    Moderate      Past Psychiatric History: I have  reviewed past psychiatric history from progress note on 06/08/2021.  Multiple medication trials in the past include Navane , Prolixin .  Multiple inpatient behavioral health admissions in the past.  Past Medical History:  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 Lily   COLONOSCOPY  04/23/2010   normal   COLONOSCOPY WITH PROPOFOL   01/22/2021   poor prep[   ESOPHAGOGASTRODUODENOSCOPY N/A 12/31/2013   Procedure: ESOPHAGOGASTRODUODENOSCOPY (EGD);  Surgeon:  Lupita FORBES Commander, MD;  Location: [facility];  Service: Endoscopy;  Laterality: N/A;   Excision of scalp lesion  07/18/2009   Excision of thyroid  mass     GANGLION CYST EXCISION     right foot x 2   HERNIA REPAIR     ventral   INCISIONAL HERNIA REPAIR  12/01/2011   Procedure: LAPAROSCOPIC INCISIONAL HERNIA;  Surgeon: Vicenta DELENA Poli, MD;  Location: [facility];  Service: General;  Laterality: N/A;   LEFT HEART CATH AND CORONARY ANGIOGRAPHY N/A 01/13/2024    Procedure: LEFT HEART CATH AND CORONARY ANGIOGRAPHY;  Surgeon: Anner Alm ORN, MD;  Location: [facility] INVASIVE CV LAB;  Service: Cardiovascular;  Laterality: N/A;   SHOULDER SURGERY     right   UPPER GASTROINTESTINAL ENDOSCOPY      Family Psychiatric History: I have reviewed family psychiatric history from progress note on 06/08/2021.  Family History:  Family History  Problem Relation Age of Onset   Hypertension Mother    Bone cancer Mother    Hypertension Father    Kidney disease Father    Prostate cancer Brother    Bone cancer Brother    Kidney disease Brother    Mental illness Maternal Grandmother    Mental illness Son    Prostate cancer Other    Colon cancer Neg Hx    Colon polyps Neg Hx    Esophageal cancer Neg Hx    Rectal cancer Neg Hx    Stomach cancer Neg Hx     Social History: Reviewed social history from progress note on 06/08/2021. Social History   Socioeconomic History   Marital status: Married    Spouse name: [name]   Number of children: 2   Years of education: Not on file   Highest education level: 12th grade  Occupational History   Occupation: Disabled    Employer: DISABLED  Tobacco Use   Smoking status: Every Day    Current packs/day: 1.50    Average packs/day: 1.5 packs/day for 55.0 years (82.6 ttl pk-yrs)    Types: Cigarettes    Start date: 1971    Passive exposure: Never   Smokeless tobacco: Never   Tobacco comments:    Started smoking at age 12,still smoking pack - 1-1/2 packs  per day 01/08/2021  Vaping Use   Vaping status: Never Used  Substance and Sexual Activity   Alcohol use: Not Currently    Comment: rare   Drug use: Yes    Frequency: 1.0 times per week    Types: Marijuana    Comment: sometimes for pain   Sexual activity: Yes    Partners: Female  Other Topics Concern   Not on file  Social History Narrative   HSG disabled due to schizophrenia - 1983. stable long-term marriage with children. cared for his father-in-law- passed  away in '08   Social Drivers of Health   Tobacco Use: High Risk (05/03/2024)   Patient History    Smoking Tobacco Use: Every Day    Smokeless Tobacco Use: Never    Passive Exposure: Never  Financial Resource Strain: Low Risk (12/29/2023)   Overall Financial Resource Strain (CARDIA)    Difficulty of Paying Living Expenses: Not hard at all  Food Insecurity: Patient Declined (01/13/2024)   Epic    Worried About Radiation Protection Practitioner of Food in the Last Year: Patient declined    Barista in the Last Year: Patient declined  Transportation Needs: Patient Declined (01/13/2024)   Epic  Lack of Transportation (Medical): Patient declined    Lack of Transportation (Non-Medical): Patient declined  Physical Activity: Insufficiently Active (12/29/2023)   Exercise Vital Sign    Days of Exercise per Week: 2 days    Minutes of Exercise per Session: 10 min  Stress: No Stress Concern Present (12/29/2023)   [employer] of Occupational Health - Occupational Stress Questionnaire    Feeling of Stress: Not at all  Social Connections: Patient Declined (01/13/2024)   Social Connection and Isolation Panel    Frequency of Communication with Friends and Family: Patient declined    Frequency of Social Gatherings with Friends and Family: Patient declined    Attends Religious Services: Patient declined    Active Member of Clubs or Organizations: Patient declined    Attends Banker Meetings: Patient declined    Marital Status: Patient declined  Recent Concern: Social Connections - Moderately Isolated (12/05/2023)   Social Connection and Isolation Panel    Frequency of Communication with Friends and Family: More than three times a week    Frequency of Social Gatherings with Friends and Family: Twice a week    Attends Religious Services: Never    Database Administrator or Organizations: No    Attends Banker Meetings: Never    Marital Status: Married  Depression (PHQ2-9): Low Risk  (05/03/2024)   Depression (PHQ2-9)    PHQ-2 Score: 0  Recent Concern: Depression (PHQ2-9) - Medium Risk (05/03/2024)   Depression (PHQ2-9)    PHQ-2 Score: 9  Alcohol Screen: Low Risk (12/29/2023)   Alcohol Screen    Last Alcohol Screening Score (AUDIT): 1  Housing: Patient Declined (01/13/2024)   Epic    Unable to Pay for Housing in the Last Year: Patient declined    Number of Times Moved in the Last Year: Not on file    Homeless in the Last Year: Patient declined  Utilities: Patient Declined (01/13/2024)   Epic    Threatened with loss of utilities: Patient declined  Health Literacy: Inadequate Health Literacy (12/05/2023)   B1300 Health Literacy    Frequency of need for help with medical instructions: Sometimes    Allergies: Allergies[1]  Metabolic Disorder Labs: Lab Results  Component Value Date   HGBA1C 6.2 11/18/2023   MPG 123 08/20/2022   No results found for: PROLACTIN Lab Results  Component Value Date   CHOL 154 11/18/2023   TRIG 117.0 11/18/2023   HDL 56.40 11/18/2023   CHOLHDL 3 11/18/2023   VLDL 23.4 11/18/2023   LDLCALC 74 11/18/2023   LDLCALC 99 10/24/2018   Lab Results  Component Value Date   TSH 1.98 08/20/2022   TSH 1.73 05/21/2021    Therapeutic Level Labs: No results found for: LITHIUM No results found for: VALPROATE No results found for: CBMZ  Current Medications: Current Outpatient Medications  Medication Sig Dispense Refill   acetaminophen  (TYLENOL ) 500 MG tablet Take 1,000 mg by mouth every 6 (six) hours as needed for moderate pain.      albuterol  (PROVENTIL ) (2.5 MG/3ML) 0.083% nebulizer solution INHALE 3 ML BY NEBULIZATION EVERY 6 HOURS AS NEEDED FOR WHEEZING OR SHORTNESS OF BREATH 150 mL 0   albuterol  (VENTOLIN  HFA) 108 (90 Base) MCG/ACT inhaler TAKE 2 PUFFS BY MOUTH EVERY 6 HOURS AS NEEDED FOR WHEEZE OR SHORTNESS OF BREATH 8.5 each 6   amLODipine  (NORVASC ) 10 MG tablet Take 10 mg by mouth daily.     aspirin  EC 81 MG tablet Take 1  tablet (81 mg  total) by mouth daily. Swallow whole. 90 tablet 0   benztropine  (COGENTIN ) 0.5 MG tablet TAKE 1 TABLET BY MOUTH TWICE A DAY AS NEEDED FOR SIDE EFFECTS OF PROLIXIN  180 tablet 1   carvedilol  (COREG ) 12.5 MG tablet Take 1 tablet (12.5 mg total) by mouth 2 (two) times daily with a meal. 180 tablet 0   celecoxib  (CELEBREX ) 50 MG capsule TAKE 1 CAPSULE (50 MG TOTAL) BY MOUTH 2 (TWO) TIMES DAILY AS NEEDED FOR PAIN. 60 capsule 6   cetirizine  (ZYRTEC ) 10 MG tablet Take 1 tablet (10 mg total) by mouth daily. (Patient taking differently: Take 10 mg by mouth every evening.) 30 tablet 11   Continuous Glucose Receiver (FREESTYLE LIBRE 3 READER) DEVI Use it to check sugar daily 1 each 0   Continuous Glucose Sensor (FREESTYLE LIBRE 3 PLUS SENSOR) MISC Change sensor every 15 days. 3 each 4   Cyanocobalamin  (VITAMIN B-12 PO) Take 1 tablet by mouth daily.     cyclobenzaprine  (FLEXERIL ) 10 MG tablet TAKE 1 TABLET BY MOUTH EVERYDAY AT BEDTIME 30 tablet 1   diclofenac  Sodium (VOLTAREN ) 1 % GEL Apply 2 g topically 4 (four) times daily as needed (pain). 150 g 0   EPINEPHrine  0.3 mg/0.3 mL IJ SOAJ injection Inject 0.3 mg into the muscle as needed for anaphylaxis. 1 each 1   esomeprazole  (NEXIUM ) 40 MG capsule TAKE 1 CAPSULE BY MOUTH EVERY DAY 90 capsule 3   fluPHENAZine  (PROLIXIN ) 10 MG tablet Take 1 tablet (10 mg total) by mouth daily. 90 tablet 3   fluticasone  (FLONASE ) 50 MCG/ACT nasal spray Place 2 sprays into both nostrils daily. 16 g 6   furosemide  (LASIX ) 20 MG tablet Take 1 tablet (20 mg total) by mouth daily as needed for fluid or edema. Take 1 tablet daily as directed; may take 2nd pill in as needed for edema     gabapentin  (NEURONTIN ) 300 MG capsule TAKE 1 CAPSULE BY MOUTH TWICE A DAY 180 capsule 1   hydrOXYzine  (VISTARIL ) 25 MG capsule TAKE 1 CAPSULE (25 MG TOTAL) BY MOUTH EVERY 8 (EIGHT) HOURS AS NEEDED FOR ANXIETY. 270 capsule 1   oxyCODONE -acetaminophen  (PERCOCET/ROXICET) 5-325 MG tablet Take 1  tablet by mouth every 6 (six) hours as needed. 10 tablet 0   potassium chloride  SA (KLOR-CON  M) 20 MEQ tablet Take 1 tablet (20 mEq total) by mouth daily as needed. On the day Lasix  is taken     rosuvastatin  (CRESTOR ) 40 MG tablet Take 1 tablet (40 mg total) by mouth daily. 90 tablet 0   sildenafil  (VIAGRA ) 100 MG tablet Take 100 mg by mouth daily as needed for erectile dysfunction.     SYMBICORT  160-4.5 MCG/ACT inhaler INHALE 2 PUFFS INTO THE LUNGS IN THE MORNING AND 2 PUFFS AT BEDTIME 30.6 each 1   tamsulosin  (FLOMAX ) 0.4 MG CAPS capsule Take 0.8 mg by mouth daily.     traMADol  (ULTRAM ) 50 MG tablet Take 1 tablet (50 mg total) by mouth every 6 (six) hours as needed. 15 tablet 0   traZODone  (DESYREL ) 100 MG tablet TAKE 1/2 TO 1 TABLET (50-100 MG TOTAL) BY MOUTH AT BEDTIME AS NEEDED FOR SLEEP 90 tablet 3   valsartan  (DIOVAN ) 320 MG tablet Take 320 mg by mouth daily.     VITAMIN D  PO Take 1 tablet by mouth daily.     No current facility-administered medications for this visit.     Musculoskeletal: Strength & Muscle Tone: within normal limits Gait & Station: normal Patient leans: N/A  Psychiatric Specialty Exam: Review of Systems  Cardiovascular:  Positive for chest pain.  Psychiatric/Behavioral:  The patient is nervous/anxious.     Blood pressure (!) 154/92, pulse 82, temperature 98.5 F (36.9 C), temperature source Temporal, height 5' 7.5 (1.715 m), weight 254 lb (115.2 kg), SpO2 100%.Body mass index is 39.19 kg/m.  General Appearance: Casual  Eye Contact:  Fair  Speech:  Normal Rate  Volume:  Normal  Mood:  Anxious  Affect:  Tearful because of pain  Thought Process:  Goal Directed and Descriptions of Associations: Intact  Orientation:  Full (Time, Place, and Person)  Thought Content: Logical   Suicidal Thoughts:  No  Homicidal Thoughts:  No  Memory:  Immediate;   Fair Recent;   Fair Remote;   Fair  Judgement:  Fair  Insight:  Fair  Psychomotor Activity:  Normal   Concentration:  Concentration: Poor and Attention Span: Poor  Recall:  Poor  Fund of Knowledge: Fair  Language: Fair  Akathisia:  No  Handed:  Right  AIMS (if indicated): Denies any side effects to medications  Assets:  Communication Skills Desire for Improvement Housing Intimacy Social Support Transportation  ADL's:  Intact  Cognition: WNL  Sleep:  Fair   Screenings: Midwife Visit from 01/19/2024 in [facility] Office Visit from 09/19/2023 in [facility] Office Visit from 06/16/2023 in [facility] Office Visit from 03/16/2023 in [facility] Office Visit from 12/14/2022 in [facility]  AIMS Total Score 0 0 0 0 0   GAD-7    Flowsheet Row Office Visit from 05/03/2024 in [facility] Office Visit from 01/19/2024 in [facility] Office Visit from 12/30/2023 in [facility] at [facility] Office Visit from 06/16/2023 in [facility] Office Visit from 03/16/2023 in [facility]  Total GAD-7 Score 1 0 0 2 0   PHQ2-9    Flowsheet Row Office Visit from 05/03/2024 in [facility] Office Visit from 01/19/2024 in [facility] Office Visit from 12/30/2023 in [facility] at [facility] Clinical Support from 12/05/2023 in [facility] at [facility] Office Visit from 09/19/2023 in [facility]  PHQ-2 Total Score 0 0 0 0 0  PHQ-9 Total Score 9 -- 0 -- --   Flowsheet Row ED from 05/03/2024 in [facility] Emergency Department at  [facility] Most recent reading at 05/03/2024  3:40 PM Office Visit from 05/03/2024 in [facility] Most recent reading at 05/03/2024  2:57 PM UC from 03/31/2024 in [facility] Most recent reading at 03/31/2024 11:13 AM  C-SSRS RISK CATEGORY No Risk No Risk No Risk     Assessment and Plan: Kathan Kirker is a 69 year old African-American male on disability, presented for a follow-up appointment.  Discussed assessment and plan as noted below.  1. Schizophrenia, chronic condition (HCC)-stable Denies any current psychosis or other concerns Continue Prolixin  10 mg daily Continue Benztropine  0.5 mg twice a day as needed for side effects.  2. Other specified anxiety disorders, generalized anxiety not occurring more days than not-stable Currently anxious due to current chest pain however otherwise reports  current medication is beneficial for anxiety Continue Hydroxyzine  12.5-25 mg daily as needed for anxiety attacks  3. Insomnia due to medical condition-stable Only reports sleep is overall good. Continue Trazodone  50-100 mg at bedtime  4. Tobacco use disorder-unstable Reviewed trials of Chantix , did not like it. Will reevaluate in future sessions.  Patient currently with chest pain, elevated blood pressure reading in session, earlier today was evaluated by EMS however pain continues to worsen.  Patient as well as wife advised to go to the nearest emergency department.  Patient as well as wife agreed with plan.    Collaboration of Care: Collaboration of Care: Other patient referred to the emergency department.  Patient/Guardian was advised Release of Information must be obtained prior to any record release in order to collaborate their care with an outside provider. Patient/Guardian was advised if they have not already done so to contact the registration department to sign all necessary forms in order for  us  to release information regarding their care.   Consent: Patient/Guardian gives verbal consent for treatment and assignment of benefits for services provided during this visit. Patient/Guardian expressed understanding and agreed to proceed.  This note was generated in part or whole with voice recognition software. Voice recognition is usually quite accurate but there are transcription errors that can and very often do occur. I apologize for any typographical errors that were not detected and corrected.     Maryellen Barth, MD 05/04/2024, 8:23 AM     [1]  Allergies Allergen Reactions   Iohexol  Other (See Comments)    Affects nerves: triggers schizophrenia   Ivp Dye [Iodinated Contrast Media]      triggers schizophrenia   Metoclopramide Hcl Other (See Comments)    Affects nerves: triggers schizophrenia   Ritalin [Methylphenidate]      triggers schizophrenia   Wellbutrin [Bupropion] Other (See Comments)    Makes pt smoke heavily

## 2024-05-04 ENCOUNTER — Encounter: Payer: Self-pay | Admitting: Psychiatry

## 2024-05-07 ENCOUNTER — Telehealth: Payer: Self-pay | Admitting: Nurse Practitioner

## 2024-05-07 ENCOUNTER — Telehealth: Payer: Self-pay

## 2024-05-07 NOTE — Telephone Encounter (Signed)
 Copied from CRM [id number]. Topic: Clinical - Medication Refill >> May 07, 2024  8:44 AM Eva FALCON wrote: Medication: rosuvastatin  (CRESTOR ) 40 MG tablet furosemide  (LASIX ) 20 MG tablet cyclobenzaprine  (FLEXERIL ) 10 MG tablet SYMBICORT  160-4.5 MCG/ACT inhaler albuterol  (VENTOLIN  HFA) 108 (90 Base) MCG/ACT inhaler   Has the patient contacted their pharmacy? Yes, pharmacy keeps sending to his old doctor.  (Agent: If no, request that the patient contact the pharmacy for the refill. If patient does not wish to contact the pharmacy document the reason why and proceed with request.) (Agent: If yes, when and what did the pharmacy advise?)  This is the patient's preferred pharmacy:  [phone] [address], [place] - [address] [place] [address] Phone: [phone] Fax: [phone]  Is this the correct pharmacy for this prescription? Yes If no, delete pharmacy and type the correct one.   Has the prescription been filled recently? No  Is the patient out of the medication? Yes  Has the patient been seen for an appointment in the last year OR does the patient have an upcoming appointment? Yes  Can we respond through MyChart? Yes  Agent: Please be advised that Rx refills may take up to 3 business days. We ask that you follow-up with your pharmacy.

## 2024-05-07 NOTE — Telephone Encounter (Unsigned)
 Copied from CRM [id number]. Topic: Clinical - Medication Refill >> May 07, 2024  8:44 AM Eva FALCON wrote: Medication: rosuvastatin  (CRESTOR ) 40 MG tablet furosemide  (LASIX ) 20 MG tablet cyclobenzaprine  (FLEXERIL ) 10 MG tablet SYMBICORT  160-4.5 MCG/ACT inhaler albuterol  (VENTOLIN  HFA) 108 (90 Base) MCG/ACT inhaler   Has the patient contacted their pharmacy? Yes, pharmacy keeps sending to his old doctor.  (Agent: If no, request that the patient contact the pharmacy for the refill. If patient does not wish to contact the pharmacy document the reason why and proceed with request.) (Agent: If yes, when and what did the pharmacy advise?)  This is the patient's preferred pharmacy:  [phone] [address], [place] - [address] [place] [address] Phone: [phone] Fax: [phone]  Is this the correct pharmacy for this prescription? Yes If no, delete pharmacy and type the correct one.   Has the prescription been filled recently? No  Is the patient out of the medication? Yes  Has the patient been seen for an appointment in the last year OR does the patient have an upcoming appointment? Yes  Can we respond through MyChart? Yes  Agent: Please be advised that Rx refills may take up to 3 business days. We ask that you follow-up with your pharmacy.

## 2024-05-11 ENCOUNTER — Other Ambulatory Visit: Payer: Self-pay

## 2024-05-11 MED ORDER — FUROSEMIDE 20 MG PO TABS: 20.0000 mg | ORAL_TABLET | Freq: Every day | ORAL | 0 refills | Status: DC | PRN

## 2024-05-11 MED ORDER — CYCLOBENZAPRINE HCL 10 MG PO TABS: ORAL_TABLET | ORAL | 1 refills | Status: AC

## 2024-05-11 MED ORDER — ALBUTEROL SULFATE HFA 108 (90 BASE) MCG/ACT IN AERS: 2.0000 | INHALATION_SPRAY | Freq: Four times a day (QID) | RESPIRATORY_TRACT | 6 refills | Status: AC | PRN

## 2024-05-11 MED ORDER — ROSUVASTATIN CALCIUM 40 MG PO TABS: 40.0000 mg | ORAL_TABLET | Freq: Every day | ORAL | 0 refills | Status: AC

## 2024-05-11 MED ORDER — BUDESONIDE-FORMOTEROL FUMARATE 160-4.5 MCG/ACT IN AERO: 2.0000 | INHALATION_SPRAY | Freq: Every evening | RESPIRATORY_TRACT | 1 refills | Status: AC

## 2024-05-11 NOTE — Telephone Encounter (Signed)
 Medication request has been send in to pt pharmacy.

## 2024-05-17 DIAGNOSIS — E7841 Elevated Lipoprotein(a): Secondary | ICD-10-CM | POA: Insufficient documentation

## 2024-05-17 NOTE — Progress Notes (Signed)
 " Cardiology Office Note:  .   Date:  05/17/2024  ID:  [name], DOB [birth year], MRN [id number] PCP: [name] Lauraine BRAVO, NP  [facility] Providers Cardiologist:  [name] Archer, MD { Chief Complaint:  Chief Complaint  Patient presents with   Coronary Artery Disease    History of Present Illness: .    [name] is a 68 y.o. male with a PMH of nonobstructive CAD, HTN, HLD, elevated LPA, CKD, tobacco use disorder, COPD, schizophrenia who presents for follow-up.   Discussed the use of AI scribe software for clinical note transcription with the patient, who gave verbal consent to proceed.  History of Present Illness [name] is a 69 year old male with mild coronary artery disease who presents for follow-up.  He has been experiencing shoulder pain, which he believes is contributing to his chest discomfort. He has a history of mild coronary artery disease, confirmed by a recent CT scan and coronary angiography, which showed mild blockages and calcifications but no significant obstructions.  He is scheduled for an MRI on Wednesday and a surgical consultation on the eleventh for a pinched nerve in his neck and back, which may be contributing to his symptoms.  He has a history of schizophrenia, which he has managed for 47 years. His medication for schizophrenia increases his craving for cigarettes, and he has been unable to quit smoking despite efforts to cut back. He shares a past experience of being institutionalized due to a mental health crisis.  His current medications include Lasix  20 mg once daily, baby aspirin , carvedilol  12.5 mg, rosuvastatin  40 mg daily, and valsartan  320 mg. He was previously on amlodipine  10 mg but was switched to Lasix  due to swelling. The swelling in his feet has improved since starting Lasix .  He mentions a history of a high lipoprotein A level, which is a genetic risk factor for coronary artery disease. His last LDL cholesterol level was 74  mg/dL, which is higher than desired. He is on rosuvastatin  to manage his cholesterol levels.  He denies having any chest pain or shortness of breath currently.  He stopped amlodipine  for lower extremity edema and was started on Lasix  by his PCP.  No further complaints.   Studies Reviewed: [name]    EKG: No new ECG       Cardiac Studies & Procedures   ______________________________________________________________________________________________ CARDIAC CATHETERIZATION  CARDIAC CATHETERIZATION 01/13/2024  Conclusion Table formatting from the original result was not included. Images from the original result were not included.    Prox RCA lesion is 30% stenosed.   Mid RCA lesion is 30% stenosed.   Prox LAD to Mid LAD lesion is 40% stenosed.   The left ventricular systolic function is normal.   LV end diastolic pressure is mildly elevated.  Dominance: Co-dominant   Angiographically minimal CAD with no obvious culprit lesion to explain symptoms. Considered non--Epicardial Coronary Artery Disease as etiology for chest pain Systemic hypertension-SBP in 160s over 90s); LVEDP of 17 mmHg Very tortuous innominate artery making catheter manipulation very difficult from right radial access.   RECOMMENDATIONS   Anticipated discharge date to be determined.   Recommend titration of antihypertensive agents and consider different etiology for chest pain.   Recommend Aspirin  81mg  daily for moderate CAD.    [name] Clay, MD  Findings Coronary Findings Diagnostic  Dominance: Co-dominant  Left Main Vessel was injected. Vessel is large. Vessel is angiographically normal.  Left Anterior Descending Vessel is large. The vessel exhibits minimal luminal  irregularities. Prox LAD to Mid LAD lesion is 40% stenosed.  Third Diagonal Branch Vessel is small in size.  Ramus Intermedius Vessel is large.  Left Circumflex Vessel is large. The vessel exhibits minimal luminal irregularities.  Second  Obtuse Marginal Branch Vessel is small in size.  Third Obtuse Marginal Branch Vessel is large in size.  Left Posterior Atrioventricular Artery Vessel is small in size.  Right Coronary Artery Prox RCA lesion is 30% stenosed. Mid RCA lesion is 30% stenosed.  Acute Marginal Branch Vessel is small in size.  Right Ventricular Branch Vessel is small in size.  Intervention  No interventions have been documented.   STRESS TESTS  MYOCARDIAL PERFUSION IMAGING 11/03/2020  Interpretation Summary  The left ventricular ejection fraction is mildly decreased (45-54%).  Nuclear stress EF: 54%.  There was no ST segment deviation noted during stress.  There is a small defect of mild severity present in the apex location. The defect is non-reversible. In the setting of normal LVF this likely represents attenuation artifact.  This is a low risk study.   ECHOCARDIOGRAM  ECHOCARDIOGRAM COMPLETE 01/14/2024  Narrative ECHOCARDIOGRAM REPORT    Patient Name:   [name] Date of Exam: 01/14/2024 Medical Rec #:  [id number]      Height:       67.8 in Accession #:    [id number]     Weight:       254.1 lb Date of Birth:  07/28/1955     BSA:          2.258 m Patient Age:    67 years       BP:           180/104 mmHg Patient Gender: M              HR:           78 bpm. Exam Location:  Inpatient  Procedure: 2D Echo, Cardiac Doppler and Color Doppler (Both Spectral and Color Flow Doppler were utilized during procedure).  Indications:    Chest Pain R07.9  History:        Patient has no prior history of Echocardiogram examinations. COPD, Signs/Symptoms:Murmur and Shortness of Breath; Risk Factors:Hypertension and Current Smoker. Hyperlipidemia.  Sonographer:    [name] Referring Phys: [id number] [name]  IMPRESSIONS   1. Left ventricular ejection fraction, by estimation, is 60 to 65%. The left ventricle has normal function. The left ventricle has no regional wall motion  abnormalities. Left ventricular diastolic parameters are consistent with Grade I diastolic dysfunction (impaired relaxation). 2. Right ventricular systolic function is normal. The right ventricular size is normal. There is normal pulmonary artery systolic pressure. The estimated right ventricular systolic pressure is 30.0 mmHg. 3. The mitral valve is normal in structure. No evidence of mitral valve regurgitation. No evidence of mitral stenosis. 4. The aortic valve is tricuspid. There is mild calcification of the aortic valve. There is mild thickening of the aortic valve. Aortic valve regurgitation is mild. Aortic valve sclerosis is present, with no evidence of aortic valve stenosis. Aortic valve area, by VTI measures 1.86 cm. Aortic valve mean gradient measures 8.0 mmHg. Aortic valve Vmax measures 2.17 m/s. 5. The inferior vena cava is normal in size with greater than 50% respiratory variability, suggesting right atrial pressure of 3 mmHg.  FINDINGS Left Ventricle: Left ventricular ejection fraction, by estimation, is 60 to 65%. The left ventricle has normal function. The left ventricle has no regional wall motion abnormalities. The left ventricular  internal cavity size was normal in size. There is no left ventricular hypertrophy. Left ventricular diastolic parameters are consistent with Grade I diastolic dysfunction (impaired relaxation).  Right Ventricle: The right ventricular size is normal. No increase in right ventricular wall thickness. Right ventricular systolic function is normal. There is normal pulmonary artery systolic pressure. The tricuspid regurgitant velocity is 2.60 m/s, and with an assumed right atrial pressure of 3 mmHg, the estimated right ventricular systolic pressure is 30.0 mmHg.  Left Atrium: Left atrial size was normal in size.  Right Atrium: Right atrial size was normal in size.  Pericardium: There is no evidence of pericardial effusion.  Mitral Valve: The mitral valve  is normal in structure. No evidence of mitral valve regurgitation. No evidence of mitral valve stenosis. MV peak gradient, 5.7 mmHg. The mean mitral valve gradient is 3.0 mmHg.  Tricuspid Valve: The tricuspid valve is normal in structure. Tricuspid valve regurgitation is trivial. No evidence of tricuspid stenosis.  Aortic Valve: The aortic valve is tricuspid. There is mild calcification of the aortic valve. There is mild thickening of the aortic valve. Aortic valve regurgitation is mild. Aortic regurgitation PHT measures 130 msec. Aortic valve sclerosis is present, with no evidence of aortic valve stenosis. Aortic valve mean gradient measures 8.0 mmHg. Aortic valve peak gradient measures 18.8 mmHg. Aortic valve area, by VTI measures 1.86 cm.  Pulmonic Valve: The pulmonic valve was normal in structure. Pulmonic valve regurgitation is not visualized. No evidence of pulmonic stenosis.  Aorta: The aortic root is normal in size and structure.  Venous: The inferior vena cava is normal in size with greater than 50% respiratory variability, suggesting right atrial pressure of 3 mmHg.  IAS/Shunts: No atrial level shunt detected by color flow Doppler.   LEFT VENTRICLE PLAX 2D LVIDd:         4.81 cm      Diastology LVIDs:         3.07 cm      LV e' medial:    5.00 cm/s LV PW:         0.99 cm      LV E/e' medial:  16.1 LV IVS:        0.99 cm      LV e' lateral:   10.10 cm/s LVOT diam:     2.12 cm      LV E/e' lateral: 8.0 LV SV:         76 LV SV Index:   33 LVOT Area:     3.53 cm  LV Volumes (MOD) LV vol d, MOD A2C: 135.0 ml LV vol d, MOD A4C: 220.0 ml LV vol s, MOD A2C: 47.7 ml LV vol s, MOD A4C: 75.9 ml LV SV MOD A2C:     87.3 ml LV SV MOD A4C:     220.0 ml LV SV MOD BP:      114.8 ml  RIGHT VENTRICLE             IVC RV Basal diam:  3.07 cm     IVC diam: 1.95 cm RV S prime:     15.10 cm/s TAPSE (M-mode): 2.8 cm RVSP:           30.0 mmHg  LEFT ATRIUM             Index        RIGHT  ATRIUM           Index LA diam:  4.10 cm 1.82 cm/m   RA Pressure: 3.00 mmHg LA Vol (A2C):   39.2 ml 17.36 ml/m  RA Area:     10.10 cm LA Vol (A4C):   53.0 ml 23.47 ml/m  RA Volume:   19.00 ml  8.41 ml/m LA Biplane Vol: 50.2 ml 22.23 ml/m AORTIC VALVE                     PULMONIC VALVE AV Area (Vmax):    1.90 cm      PV Vmax:       1.01 m/s AV Area (Vmean):   1.88 cm      PV Peak grad:  4.1 mmHg AV Area (VTI):     1.86 cm AV Vmax:           217.00 cm/s AV Vmean:          135.000 cm/s AV VTI:            0.406 m AV Peak Grad:      18.8 mmHg AV Mean Grad:      8.0 mmHg LVOT Vmax:         117.00 cm/s LVOT Vmean:        71.800 cm/s LVOT VTI:          0.214 m LVOT/AV VTI ratio: 0.53 AI PHT:            130 msec  AORTA Ao Root diam: 2.77 cm Ao Asc diam:  3.72 cm  MITRAL VALVE                TRICUSPID VALVE MV Area (PHT): 4.57 cm     TR Peak grad:   27.0 mmHg MV Area VTI:   2.88 cm     TR Mean grad:   26.0 mmHg MV Peak grad:  5.7 mmHg     TR Vmax:        260.00 cm/s MV Mean grad:  3.0 mmHg     Estimated RAP:  3.00 mmHg MV Vmax:       1.19 m/s     RVSP:           30.0 mmHg MV Vmean:      75.3 cm/s MV Decel Time: 166 msec     SHUNTS MV E velocity: 80.40 cm/s   Systemic VTI:  0.21 m MV A velocity: 115.00 cm/s  Systemic Diam: 2.12 cm MV E/A ratio:  0.70  Oneil Parchment MD Electronically signed by Oneil Parchment MD Signature Date/Time: 01/14/2024/11:35:35 AM    Final          ______________________________________________________________________________________________      Results Labs Lipid panel (11/2023): LDL 74, elevated lipoprotein(a)  Radiology Chest CT (05/11/2024): No pulmonary embolism, vascular calcifications, no significant acute findings  Diagnostic Coronary angiography: Mild coronary artery disease with non-significant stenoses, main vessel and right coronary artery widely patent, vascular calcifications  Risk Assessment/Calculations:                Physical Exam:    VS:  BP 128/68 (BP Location: Right Arm, Patient Position: Sitting, Cuff Size: Large)   Pulse 85   Ht 5' 8 (1.727 m)   Wt 253 lb 9.6 oz (115 kg)   SpO2 95%   BMI 38.56 kg/m      Wt Readings from Last 3 Encounters:  05/03/24 262 lb (118.8 kg)  01/20/24 259 lb (117.5 kg)  01/14/24 254 lb 1.6 oz (115.3 kg)     GEN:  Well nourished, well developed, in no acute distress NECK: No JVD; No carotid bruits CARDIAC: RRR, no murmurs, rubs, gallops RESPIRATORY:  Clear to auscultation without rales, wheezing or rhonchi  ABDOMEN: Soft, non-tender, non-distended, normal bowel sounds EXTREMITIES:  Warm and well perfused, trace bilateral edema; No deformity, 2+ radial pulses PSYCH: Normal mood and affect   ASSESSMENT AND PLAN: .    #Non-Obs CAD #Non-Cardiac Chest Pain #Morbid Obesity - Has mild nonobstructive CAD as evidenced by LHC in 2025. - His chest pain symptoms are noncardiac and are likely referred pain from his degenerative disc disease and arthritis. - He needs aggressive secondary prevention so that his CAD does not progress. -Per the SELECT trial, he would derive great benefit from being on a GLP-1.  I will refer the patient to Pharm.D. for consideration. Continue baby aspirin  81 mg daily Refer to Pharm.D. for GLP-1 consideration Follows with orthopedics regarding arthritis Follow-up in 6 months   #HTN - BP is at goal. - No changes. Continue valsartan  320 mg daily Continue carvedilol  12.5 mg twice daily Continue Lasix  20 mg daily  #HLD #Elevated LPA - The patient is high risk for progression of CAD given his elevated LPA and other cardiometabolic risk factors. - Ideally would like his LDL to be <55. Continue Crestor  40 mg daily for now Check lipid panel and hs-CRP          This note was written with the assistance of a dictation microphone or AI dictation software. Please excuse any typos or grammatical errors.   Signed, [name] Archer, MD  05/17/2024 11:35 PM    [facility] "

## 2024-05-18 ENCOUNTER — Ambulatory Visit
Attending: Student in an Organized Health Care Education/Training Program | Admitting: Student in an Organized Health Care Education/Training Program

## 2024-05-18 ENCOUNTER — Ambulatory Visit (INDEPENDENT_AMBULATORY_CARE_PROVIDER_SITE_OTHER): Admitting: Pharmacist

## 2024-05-18 ENCOUNTER — Other Ambulatory Visit: Payer: Self-pay | Admitting: Nurse Practitioner

## 2024-05-18 VITALS — BP 128/68 | HR 85 | Ht 68.0 in | Wt 253.6 lb

## 2024-05-18 DIAGNOSIS — E7841 Elevated Lipoprotein(a): Secondary | ICD-10-CM | POA: Diagnosis not present

## 2024-05-18 DIAGNOSIS — E669 Obesity, unspecified: Secondary | ICD-10-CM | POA: Insufficient documentation

## 2024-05-18 NOTE — Progress Notes (Signed)
 Patient ID: [name]                 DOB: 01-Jul-1955                    MRN: [id number]     HPI: [name] is a 69 y.o. male patient referred to pharmacy clinic by Dr. Floretta to initiate GLP1-RA therapy. PMH is significant for HTN, HLD, COPD, CKD, schizophrenia, and nonobstructive CAD (LHC in 2025), and obesity. Most recent BMI 38.58 kg/m .  Baseline weight and BMI: 253 lb, 38.58 kg/m   Diet: does not adhere to a specific diet right now. Consumes 3-4 sodas per day.   Exercise: limited to no exercise right now, but is amenable to incorporating more exercise into his routing  Family History:  Family History  Problem Relation Age of Onset   Hypertension Mother    Bone cancer Mother    Hypertension Father    Kidney disease Father    Prostate cancer Brother    Bone cancer Brother    Kidney disease Brother    Mental illness Maternal Grandmother    Mental illness Son    Prostate cancer Other    Colon cancer Neg Hx    Colon polyps Neg Hx    Esophageal cancer Neg Hx    Rectal cancer Neg Hx    Stomach cancer Neg Hx    Social History:  Tobacco: active smoker, 1.5 packs per day  Labs: Lab Results  Component Value Date   HGBA1C 6.2 11/18/2023   Wt Readings from Last 1 Encounters:  05/18/24 253 lb 9.6 oz (115 kg)   BP Readings from Last 1 Encounters:  05/18/24 128/68   Pulse Readings from Last 1 Encounters:  05/18/24 85      Component Value Date/Time   CHOL 154 11/18/2023 1100   TRIG 117.0 11/18/2023 1100   HDL 56.40 11/18/2023 1100   CHOLHDL 3 11/18/2023 1100   VLDL 23.4 11/18/2023 1100   LDLCALC 74 11/18/2023 1100   LDLDIRECT 140.0 11/19/2021 1519   Past Medical History:  Diagnosis Date   ALLERGIC RHINITIS 01/25/2007   ANXIETY DISORDER, GENERALIZED 01/25/2007   Arthritis    Chronic kidney disease    stage 3   COPD (chronic obstructive pulmonary disease) (HCC)    DEPRESSION 01/25/2007   ESOPHAGITIS 12/28/2007   Fatty liver 10/09/2010   GERD  01/25/2007   GLUCOSE INTOLERANCE, HX OF 01/25/2007   Heart murmur    hx of    HEMORRHOIDS, INTERNAL, WITH BLEEDING 04/26/2008   Hiatal hernia    HYPERLIPIDEMIA 12/27/2007   HYPERTENSION, ESSENTIAL NOS 01/25/2007   Hyperthyroidism 08/11/2010   HYPERTROPHY PROSTATE W/UR OBST & OTH LUTS 02/27/2010   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 Weight loss - Patient was referred to the weight management clinic. He currently has complete dual insurance, which will not cover GLP1RAs without a history of ASCVD, T2DM, or OSA. He is suspected to have OSA but has never completed a sleep study. Discussed insurance limitations and the need to have a sleep study done prior to moving forward with medication management. He is concerned  with the CPAP machine affecting his sleep.   Extensively discussed lifestyle changes with him, including limiting soda intake, potentially transitioning to diet soda or carbonated waters. He states that he is dependent on sodas and cigarettes, as they ease his anxiety. Additionally, he is an active smoker, but is not interested in quitting. Discussed the difficulties with making changes to our habits, but that small changes over time will be beneficial to his overall health. Encouraged him to reduce consumption and increase exercise, even if they are small changes in the beginning.   Even though he does not qualify for GLP therapy through his insurance at this time, discussed potential options in the future, including Zepbound and [id number]. Counseled on administration technique, proper storage, and common side effects including nausea, diarrhea,  dyspepsia, decreased appetite, and fatigue.   Advised to follow up with a cardiologist to further discuss a sleep study for an OSA diagnosis.   [name], PharmD PGY1 Pharmacy Resident  05/18/2024  [name], Pharm.[name], CPP [facility] A Division of [facility] [address] [place], [place] [address]  Phone: [phone]; Fax: [phone]      [1]  Current Outpatient Medications on File Prior to Visit  Medication Sig Dispense Refill   acetaminophen  (TYLENOL ) 500 MG tablet Take 1,000 mg by mouth every 6 (six) hours as needed for moderate pain.      albuterol  (PROVENTIL ) (2.5 MG/3ML) 0.083% nebulizer solution INHALE 3 ML BY NEBULIZATION EVERY 6 HOURS AS NEEDED FOR WHEEZING OR SHORTNESS OF BREATH 150 mL 0   albuterol  (VENTOLIN  HFA) 108 (90 Base) MCG/ACT inhaler Inhale 2 puffs into the lungs every 6 (six) hours as needed for wheezing or shortness of breath. 8.5 each 6   amLODipine  (NORVASC ) 10 MG tablet Take 10 mg by mouth daily.     aspirin  EC 81 MG tablet Take 1 tablet (81 mg total) by mouth daily. Swallow whole. 90 tablet 0   benztropine  (COGENTIN ) 0.5 MG tablet TAKE 1 TABLET BY MOUTH TWICE A DAY AS NEEDED FOR SIDE EFFECTS OF PROLIXIN  180 tablet 1   budesonide -formoterol  (SYMBICORT ) 160-4.5 MCG/ACT inhaler Inhale 2 puffs into the lungs at bedtime. INHALE 2 PUFFS INTO THE LUNGS IN THE MORNING AND 2 PUFFS AT BEDTIME (Patient not taking: Reported on 05/18/2024) 30.6 each 1   carvedilol  (COREG ) 12.5 MG tablet Take 1 tablet (12.5 mg total) by mouth 2 (two) times daily with a meal. 180 tablet 0   celecoxib  (CELEBREX ) 50 MG capsule TAKE 1 CAPSULE (50 MG TOTAL) BY MOUTH 2 (TWO) TIMES DAILY AS NEEDED FOR PAIN. 60 capsule 6   cetirizine  (ZYRTEC ) 10 MG tablet Take 1 tablet (10 mg total) by mouth daily. (Patient taking differently: Take 10 mg by mouth every evening.) 30 tablet 11   Continuous Glucose Receiver (FREESTYLE LIBRE 3 READER) DEVI Use it to  check sugar daily (Patient not taking: Reported on 05/18/2024) 1 each 0   Continuous Glucose Sensor (FREESTYLE LIBRE 3 PLUS SENSOR) MISC Change sensor every 15 days. (Patient not taking: Reported on 05/18/2024) 3 each 4   Cyanocobalamin  (VITAMIN B-12 PO) Take 1 tablet by mouth daily.     cyclobenzaprine  (FLEXERIL ) 10 MG tablet TAKE 1 TABLET BY MOUTH EVERYDAY AT BEDTIME 30 tablet 1   diclofenac  Sodium (VOLTAREN ) 1 % GEL Apply 2 g topically 4 (four) times daily as needed (pain). 150 g 0   EPINEPHrine  0.3 mg/0.3 mL IJ SOAJ injection Inject 0.3 mg into the  muscle as needed for anaphylaxis. 1 each 1   esomeprazole  (NEXIUM ) 40 MG capsule TAKE 1 CAPSULE BY MOUTH EVERY DAY 90 capsule 3   fluPHENAZine  (PROLIXIN ) 10 MG tablet Take 1 tablet (10 mg total) by mouth daily. 90 tablet 3   fluticasone  (FLONASE ) 50 MCG/ACT nasal spray Place 2 sprays into both nostrils daily. 16 g 6   furosemide  (LASIX ) 20 MG tablet Take 1 tablet (20 mg total) by mouth daily as needed for fluid or edema. Take 1 tablet daily as directed; may take 2nd pill in as needed for edema (Patient taking differently: Take 20 mg by mouth daily. Take 1 tablet daily as directed; may take 2nd pill in as needed for edema) 30 tablet 0   gabapentin  (NEURONTIN ) 300 MG capsule TAKE 1 CAPSULE BY MOUTH TWICE A DAY 180 capsule 1   hydrOXYzine  (VISTARIL ) 25 MG capsule TAKE 1 CAPSULE (25 MG TOTAL) BY MOUTH EVERY 8 (EIGHT) HOURS AS NEEDED FOR ANXIETY. 270 capsule 1   oxyCODONE -acetaminophen  (PERCOCET/ROXICET) 5-325 MG tablet Take 1 tablet by mouth every 6 (six) hours as needed. 10 tablet 0   potassium chloride  SA (KLOR-CON  M) 20 MEQ tablet Take 1 tablet (20 mEq total) by mouth daily as needed. On the day Lasix  is taken     rosuvastatin  (CRESTOR ) 40 MG tablet Take 1 tablet (40 mg total) by mouth daily. 90 tablet 0   sildenafil  (VIAGRA ) 100 MG tablet Take 100 mg by mouth daily as needed for erectile dysfunction. (Patient not taking: Reported on 05/18/2024)      tamsulosin  (FLOMAX ) 0.4 MG CAPS capsule Take 0.8 mg by mouth daily.     traMADol  (ULTRAM ) 50 MG tablet Take 1 tablet (50 mg total) by mouth every 6 (six) hours as needed. (Patient not taking: Reported on 05/18/2024) 15 tablet 0   traZODone  (DESYREL ) 100 MG tablet TAKE 1/2 TO 1 TABLET (50-100 MG TOTAL) BY MOUTH AT BEDTIME AS NEEDED FOR SLEEP 90 tablet 3   valsartan  (DIOVAN ) 320 MG tablet Take 320 mg by mouth daily.     VITAMIN D  PO Take 1 tablet by mouth daily.     No current facility-administered medications on file prior to visit.  [2]  Allergies Allergen Reactions   Iohexol  Other (See Comments)    Affects nerves: triggers schizophrenia   Ivp Dye [Iodinated Contrast Media]      triggers schizophrenia   Metoclopramide Hcl Other (See Comments)    Affects nerves: triggers schizophrenia   Ritalin [Methylphenidate]      triggers schizophrenia   Wellbutrin [Bupropion] Other (See Comments)    Makes pt smoke heavily

## 2024-05-18 NOTE — Patient Instructions (Addendum)
 Medication Instructions:  Your physician recommends that you continue on your current medications as directed. Please refer to the Current Medication list given to you today.  *If you need a refill on your cardiac medications before your next appointment, please call your pharmacy*  Lab Work: Lipid Panel and CRP at [facility] on 1st floor  If you have labs (blood work) drawn today and your tests are completely normal, you will receive your results only by: MyChart Message (if you have MyChart) OR A paper copy in the mail If you have any lab test that is abnormal or we need to change your treatment, we will call you to review the results.  Testing/Procedures: Your physician has referred you to the Pharmacy Clinic.   Follow-Up: At [facility], you and your health needs are our priority.  As part of our continuing mission to provide you with exceptional heart care, our providers are all part of one team.  This team includes your primary Cardiologist (physician) and Advanced Practice Providers or APPs (Physician Assistants and Nurse Practitioners) who all work together to provide you with the care you need, when you need it.  Your next appointment:   6 month(s)  Provider:   Georganna Archer, MD     Other Instructions

## 2024-05-19 ENCOUNTER — Ambulatory Visit: Payer: Self-pay | Admitting: Student in an Organized Health Care Education/Training Program

## 2024-05-19 LAB — LIPID PANEL
Chol/HDL Ratio: 2.2 ratio (ref 0.0–5.0)
Cholesterol, Total: 146 mg/dL (ref 100–199)
HDL: 65 mg/dL
LDL Chol Calc (NIH): 66 mg/dL (ref 0–99)
Triglycerides: 78 mg/dL (ref 0–149)
VLDL Cholesterol Cal: 15 mg/dL (ref 5–40)

## 2024-05-19 LAB — HIGH SENSITIVITY CRP: CRP, High Sensitivity: 2.2 mg/L (ref 0.00–3.00)

## 2024-05-21 ENCOUNTER — Telehealth: Payer: Self-pay

## 2024-05-22 ENCOUNTER — Telehealth: Payer: Self-pay

## 2024-05-22 DIAGNOSIS — R0683 Snoring: Secondary | ICD-10-CM

## 2024-05-22 MED ORDER — EZETIMIBE 10 MG PO TABS: 10.0000 mg | ORAL_TABLET | Freq: Every day | ORAL | 3 refills | Status: AC

## 2024-05-22 NOTE — Telephone Encounter (Signed)
-----   Message from Georganna Archer, MD sent at 05/21/2024  5:14 PM EST ----- Regarding: Sleep Study Walterine Bottcher!  Would you be so kind as to call Mr. Cozby and see if he would be interested in undergoing a sleep study to screen for sleep apnea?  Thank you!  Lonnie

## 2024-05-28 ENCOUNTER — Ambulatory Visit: Admitting: Allergy

## 2024-06-11 ENCOUNTER — Ambulatory Visit: Admitting: Psychiatry

## 2024-07-13 ENCOUNTER — Ambulatory Visit: Admitting: Nurse Practitioner

## 2024-12-06 ENCOUNTER — Ambulatory Visit
